# Patient Record
Sex: Female | Born: 1975 | Race: White | Hispanic: No | Marital: Single
Health system: Southern US, Community
[De-identification: ages and names within clinical notes are randomized; demographics above are authoritative.]

## PROBLEM LIST (undated history)

## (undated) DIAGNOSIS — I38 Endocarditis, valve unspecified: Secondary | ICD-10-CM

## (undated) DIAGNOSIS — J45909 Unspecified asthma, uncomplicated: Secondary | ICD-10-CM

## (undated) DIAGNOSIS — B1920 Unspecified viral hepatitis C without hepatic coma: Secondary | ICD-10-CM

## (undated) DIAGNOSIS — D649 Anemia, unspecified: Secondary | ICD-10-CM

## (undated) DIAGNOSIS — F1910 Other psychoactive substance abuse, uncomplicated: Secondary | ICD-10-CM

## (undated) DIAGNOSIS — M1990 Unspecified osteoarthritis, unspecified site: Secondary | ICD-10-CM

## (undated) HISTORY — PX: TUMOR EXCISION: SHX421

## (undated) HISTORY — PX: OTHER SURGICAL HISTORY: SHX169

---

## 1999-01-29 ENCOUNTER — Inpatient Hospital Stay (HOSPITAL_COMMUNITY): Admission: AD | Admit: 1999-01-29 | Discharge: 1999-02-03 | Payer: Self-pay | Admitting: Psychiatry

## 1999-01-29 ENCOUNTER — Emergency Department (HOSPITAL_COMMUNITY): Admission: EM | Admit: 1999-01-29 | Discharge: 1999-01-29 | Payer: Self-pay | Admitting: Emergency Medicine

## 1999-09-02 ENCOUNTER — Inpatient Hospital Stay (HOSPITAL_COMMUNITY): Admission: EM | Admit: 1999-09-02 | Discharge: 1999-09-04 | Payer: Self-pay | Admitting: Emergency Medicine

## 1999-09-03 ENCOUNTER — Encounter: Payer: Self-pay | Admitting: Internal Medicine

## 2004-08-05 ENCOUNTER — Emergency Department: Payer: Self-pay | Admitting: Emergency Medicine

## 2005-04-23 ENCOUNTER — Emergency Department: Payer: Self-pay | Admitting: Emergency Medicine

## 2005-04-27 ENCOUNTER — Emergency Department (HOSPITAL_COMMUNITY): Admission: EM | Admit: 2005-04-27 | Discharge: 2005-04-28 | Payer: Self-pay | Admitting: Emergency Medicine

## 2006-04-15 ENCOUNTER — Ambulatory Visit (HOSPITAL_COMMUNITY): Admission: RE | Admit: 2006-04-15 | Discharge: 2006-04-15 | Payer: Self-pay | Admitting: Obstetrics and Gynecology

## 2006-05-15 ENCOUNTER — Ambulatory Visit (HOSPITAL_COMMUNITY): Admission: RE | Admit: 2006-05-15 | Discharge: 2006-05-15 | Payer: Self-pay | Admitting: Orthopedic Surgery

## 2006-06-08 ENCOUNTER — Ambulatory Visit: Payer: Self-pay | Admitting: Internal Medicine

## 2006-08-15 ENCOUNTER — Inpatient Hospital Stay (HOSPITAL_COMMUNITY): Admission: AD | Admit: 2006-08-15 | Discharge: 2006-08-15 | Payer: Self-pay | Admitting: Obstetrics & Gynecology

## 2006-08-21 ENCOUNTER — Ambulatory Visit: Payer: Self-pay | Admitting: *Deleted

## 2006-08-21 ENCOUNTER — Inpatient Hospital Stay (HOSPITAL_COMMUNITY): Admission: AD | Admit: 2006-08-21 | Discharge: 2006-08-24 | Payer: Self-pay | Admitting: Obstetrics & Gynecology

## 2009-10-13 ENCOUNTER — Emergency Department: Payer: Self-pay | Admitting: Emergency Medicine

## 2010-03-31 ENCOUNTER — Emergency Department: Payer: Self-pay | Admitting: Emergency Medicine

## 2010-04-03 ENCOUNTER — Emergency Department: Payer: Self-pay | Admitting: Emergency Medicine

## 2010-05-25 ENCOUNTER — Encounter: Payer: Self-pay | Admitting: Orthopedic Surgery

## 2010-08-08 ENCOUNTER — Inpatient Hospital Stay: Payer: Self-pay | Admitting: Psychiatry

## 2010-09-19 NOTE — Assessment & Plan Note (Signed)
[facility]                             PULMONARY OFFICE NOTE   NAME:Andrea King, Andrea King                      MRN:          [id number]  DATE:06/08/2006                            DOB:          January 18, 1976    HISTORY:  A 35 year old white female active smoker who is now [redacted] weeks  pregnant and describes a pattern of chronic rhinitis lasting forever.  Typically present year round, but worse in the spring associated with  nasal obstructive symptoms and watery discharge, controlled with  Sudafed.  For the last 2 years, in addition to these symptoms, she has  had daily symptoms of cough, congestion, wheezing, and shortness of  breath that improve with beta 2 agonists.  She finds herself now [redacted]  weeks pregnant, using albuterol up to 4 times daily with occasional  night time use as well with no significant pleuritic chest pain, fevers,  chills, sweats, orthopnea, PND, or leg swelling, or obvious flare-up of  her nasal symptoms.   ALLERGIES:  None known.   MEDICATIONS:  Prenatal vitamin 1 daily.  Percocet.  Albuterol.  Phenergan with codeine p.r.n.   SOCIAL HISTORY:  She continues to smoke a half pack a day.  She denies  any unusual travel, pet, or hobby exposure.   FAMILY HISTORY:  She denies any respiratory diseases in the family  including atopy or asthma.   REVIEW OF SYSTEMS:  Taken in detail on the worksheet and negative,  except as outlined above.   PHYSICAL EXAMINATION:  This is a pleasant but slightly anxious  ambulatory white female with a nasal tone to her voice.  VITAL SIGNS:  Stable vital signs.  HEENT:  Reveals moderate turbinate edema.  She smells heavily of  cigarettes.  Oropharynx is clear.  No evidence of post-nasal drainage or  cobblestoning, however.  NECK:  Supple without cervical adenopathy or tenderness. Trachea is  midline.  No thyromegaly.  LUNGS:  Fields reveal minimal rhonchi on FVC maneuver 5-and-a-half hours  after her last  albuterol treatment.  CARDIAC:  Regular rate and rhythm without murmur, gallop, or rub.  ABDOMEN:  Soft and benign.  Consistent with intrauterine pregnancy.  EXTREMITIES:  Warm without calf tenderness, cyanosis, clubbing, or  edema.   Heme saturation 98% on room air.   MDI technique at baseline was 25%, but improved to 75% with  bronchodilators.   IMPRESSION:  Chronic asthmatic bronchitis being partially fueled by  cigarettes, perhaps also related to chronic rhinitis and now rhinitis of  pregnancy, plus gastroesophageal reflux disease related to pregnancy.  Obviously, the cigarettes are the greatest issue, and we discussed this  at length today.   Since she is over-using albuterol, I have recommended that she use  Symbicort 164.5 one puff b.i.d. and add Nasacort AQ b.i.d. (samples of  both given for the next 4 weeks).  I have reviewed a GERD diet with her  and recommended Pepcid AC over the counter for symptomatic heartburn.   I have also given her my pH number to call if she notices worsening that  does not respond promptly to  albuterol.     Charlaine Dalton. Sherene Sires, MD, [facility]  Electronically Signed    MBW/MedQ  DD: 06/08/2006  DT: 06/08/2006  Job #: [id number]   cc:   Shelbie Proctor. Shawnie Pons, M.D.

## 2012-05-12 ENCOUNTER — Emergency Department: Payer: Self-pay | Admitting: Emergency Medicine

## 2013-03-04 ENCOUNTER — Encounter (HOSPITAL_COMMUNITY): Payer: Self-pay | Admitting: Emergency Medicine

## 2013-03-04 ENCOUNTER — Emergency Department (HOSPITAL_COMMUNITY)
Admission: EM | Admit: 2013-03-04 | Discharge: 2013-03-04 | Disposition: A | Discharge status: Home / Self Care | Payer: Self-pay | Attending: Emergency Medicine | Admitting: Emergency Medicine

## 2013-03-04 ENCOUNTER — Emergency Department (HOSPITAL_COMMUNITY): Payer: Self-pay

## 2013-03-04 DIAGNOSIS — M1990 Unspecified osteoarthritis, unspecified site: Secondary | ICD-10-CM

## 2013-03-04 DIAGNOSIS — Z79899 Other long term (current) drug therapy: Secondary | ICD-10-CM | POA: Insufficient documentation

## 2013-03-04 DIAGNOSIS — F17200 Nicotine dependence, unspecified, uncomplicated: Secondary | ICD-10-CM | POA: Insufficient documentation

## 2013-03-04 DIAGNOSIS — M25562 Pain in left knee: Secondary | ICD-10-CM

## 2013-03-04 DIAGNOSIS — M25569 Pain in unspecified knee: Secondary | ICD-10-CM | POA: Insufficient documentation

## 2013-03-04 MED ORDER — PREDNISONE 10 MG PO TABS: ORAL_TABLET | ORAL | Status: DC

## 2013-03-04 MED ORDER — PREDNISONE 50 MG PO TABS
60.0000 mg | ORAL_TABLET | Freq: Once | ORAL | Status: AC
  Administered 2013-03-04: 60 mg via ORAL
  Filled 2013-03-04 (×2): qty 1

## 2013-03-04 MED ORDER — DICLOFENAC SODIUM 75 MG PO TBEC: 75.0000 mg | DELAYED_RELEASE_TABLET | Freq: Two times a day (BID) | ORAL | Status: DC

## 2013-03-04 MED ORDER — ONDANSETRON HCL 4 MG PO TABS
4.0000 mg | ORAL_TABLET | Freq: Once | ORAL | Status: AC
  Administered 2013-03-04: 4 mg via ORAL
  Filled 2013-03-04: qty 1

## 2013-03-04 MED ORDER — TRAMADOL HCL 50 MG PO TABS
50.0000 mg | ORAL_TABLET | Freq: Once | ORAL | Status: AC
  Administered 2013-03-04: 50 mg via ORAL
  Filled 2013-03-04: qty 1

## 2013-03-04 MED ORDER — TRAMADOL HCL 50 MG PO TABS: 50.0000 mg | ORAL_TABLET | Freq: Four times a day (QID) | ORAL | Status: DC | PRN

## 2013-03-04 MED ORDER — KETOROLAC TROMETHAMINE 10 MG PO TABS
10.0000 mg | ORAL_TABLET | Freq: Once | ORAL | Status: AC
  Administered 2013-03-04: 10 mg via ORAL
  Filled 2013-03-04: qty 1

## 2013-03-04 NOTE — ED Notes (Signed)
Pt reports left knee pain since waking this am, denies any known injury

## 2013-03-04 NOTE — ED Notes (Signed)
Hobson PA at bedside speaking with pt

## 2013-03-04 NOTE — ED Provider Notes (Signed)
CSN: [id number]     Arrival date & time 03/04/13  1609 History   First MD Initiated Contact with Patient 03/04/13 1641     Chief Complaint  Patient presents with  . Knee Pain   (Consider location/radiation/quality/duration/timing/severity/associated sxs/prior Treatment) HPI Comments: Patient is a 37 year old female who states that on yesterday she was in her usual good health. She states that she did more walking and climbing up steps than usual. This morning upon awakening she noted severe pain of her left knee. She thinks that there may have been a little swelling there as well. She denies any recent injury or trauma. There's been no previous operations or procedures involving the left knee.  The patient also states that she has noticed that when she stands up or has to strain that there is an area that" pops out" in her left groin. She states she was told that it was a lymph node, but would like for it to be checked. It is not causing any pain at this time.  Patient is a 37 y.o. female presenting with knee pain. The history is provided by the patient.  Knee Pain Associated symptoms: no back pain and no neck pain     History reviewed. No pertinent past medical history. Past Surgical History  Procedure Laterality Date  . Tumor excision      left scalpula   No family history on file. History  Substance Use Topics  . Smoking status: Current Every Day Smoker    Types: Cigarettes  . Smokeless tobacco: Not on file  . Alcohol Use: No   OB History   Grav Para Term Preterm Abortions TAB SAB Ect Mult Living                 Review of Systems  Constitutional: Negative for activity change.       All ROS Neg except as noted in HPI  HENT: Negative for nosebleeds.   Eyes: Negative for photophobia and discharge.  Respiratory: Negative for cough, shortness of breath and wheezing.   Cardiovascular: Negative for chest pain and palpitations.  Gastrointestinal: Negative for abdominal pain and  blood in stool.  Genitourinary: Negative for dysuria, frequency and hematuria.  Musculoskeletal: Positive for arthralgias. Negative for back pain and neck pain.  Skin: Negative.   Neurological: Negative for dizziness, seizures and speech difficulty.  Psychiatric/Behavioral: Negative for hallucinations and confusion.    Allergies  Review of patient's allergies indicates no known allergies.  Home Medications   Current Outpatient Rx  Name  Route  Sig  Dispense  Refill  . naproxen sodium (ALEVE) 220 MG tablet   Oral   Take 440 mg by mouth once as needed (for pain).         Marland Kitchen diclofenac (VOLTAREN) 75 MG EC tablet   Oral   Take 1 tablet (75 mg total) by mouth 2 (two) times daily.   14 tablet   0   . predniSONE (DELTASONE) 10 MG tablet      5,4,3,2,1 - take with food   15 tablet   0   . traMADol (ULTRAM) 50 MG tablet   Oral   Take 1 tablet (50 mg total) by mouth every 6 (six) hours as needed for pain.   20 tablet   0    BP 111/70  Pulse 94  Temp(Src) 98.7 F (37.1 C) (Oral)  Resp 20  Ht 5\' 2"  (1.575 m)  Wt 120 lb (54.432 kg)  BMI 21.94 kg/m2  SpO2 100%  LMP 02/26/2013 Physical Exam  Nursing note and vitals reviewed. Constitutional: She is oriented to person, place, and time. She appears well-developed and well-nourished.  Non-toxic appearance.  HENT:  Head: Normocephalic.  Right Ear: Tympanic membrane and external ear normal.  Left Ear: Tympanic membrane and external ear normal.  Eyes: EOM and lids are normal. Pupils are equal, round, and reactive to light.  Neck: Normal range of motion. Neck supple. Carotid bruit is not present.  Cardiovascular: Normal rate, regular rhythm, normal heart sounds, intact distal pulses and normal pulses.   Pulmonary/Chest: Breath sounds normal. No respiratory distress.  Abdominal: Soft. Bowel sounds are normal. There is no tenderness. There is no guarding.  Musculoskeletal: Normal range of motion.  There is good range of motion of  the left hip. There is crepitus present with attempted range of motion of the left knee. There is pain with attempted range of motion of the left knee. There is no posterior mass appreciated. The left knee is warmer than the right knee. There is no deformity of the quadricep area or the anterior tibial tuberosity. The Achilles tendon is intact. There is full range of motion of the toes. The dorsalis pedis pulses 2+.  Lymphadenopathy:       Head (right side): No submandibular adenopathy present.       Head (left side): No submandibular adenopathy present.    She has no cervical adenopathy.  Neurological: She is alert and oriented to person, place, and time. She has normal strength. No cranial nerve deficit or sensory deficit.  Skin: Skin is warm and dry.  Psychiatric: She has a normal mood and affect. Her speech is normal.    ED Course  Procedures (including critical care time) Labs Review Labs Reviewed - No data to display Imaging Review Dg Knee Complete 4 Views Left  03/04/2013   CLINICAL DATA:  Medial knee pain  EXAM: LEFT KNEE - COMPLETE 4+ VIEW  COMPARISON:  None.  FINDINGS: There is no evidence of fracture, dislocation, or joint effusion. There is no evidence of arthropathy or other focal bone abnormality. Soft tissues are unremarkable.  IMPRESSION: No acute abnormality noted.   Electronically Signed   By: Alcide Clever M.D.   On: 03/04/2013 16:45    EKG Interpretation   None       MDM   1. Knee pain, left   2. DJD (degenerative joint disease)    **I have reviewed nursing notes, vital signs, and all appropriate lab and imaging results for this patient.*  X-ray of the left knee is negative for fracture or dislocation. Examination is consistent with arthritis or inflammatory process.  Examination of the left groin is consistent with a small hernia area that is reducible with patient sitting.  The patient is advised to see Dr. Hilda Lias for evaluation of the knee, and Dr. Malvin Johns  for evaluation of the hernia. The patient is given information on resources, patient also given information concerning the adult medicine clinic as the patient does not have a primary physician and states she does not have insurance resources at this time.Rx for prednisone, diclofenac, and ultram given to the patient.  Kathie Dike, PA-C 03/04/13 1731

## 2013-03-04 NOTE — ED Notes (Signed)
Pt c/o left knee pain and swelling that she woke up this am, denies any injury, states that she went to bed fine, pt also concerned because of swelling to left groin area for the past 8 months,

## 2013-03-04 NOTE — ED Provider Notes (Signed)
Medical screening examination/treatment/procedure(s) were performed by non-physician practitioner and as supervising physician I was immediately available for consultation/collaboration.  EKG Interpretation   None       Devoria Albe, MD, [facility]   Ward Givens, MD 03/04/13 [phone]

## 2013-04-27 ENCOUNTER — Encounter (HOSPITAL_COMMUNITY): Payer: Self-pay | Admitting: Emergency Medicine

## 2013-04-27 ENCOUNTER — Inpatient Hospital Stay (HOSPITAL_COMMUNITY)
Admission: EM | Admit: 2013-04-27 | Discharge: 2013-04-29 | DRG: 352 | Disposition: A | Discharge status: Home / Self Care | Payer: Self-pay | Attending: General Surgery | Admitting: General Surgery

## 2013-04-27 DIAGNOSIS — K4130 Unilateral femoral hernia, with obstruction, without gangrene, not specified as recurrent: Secondary | ICD-10-CM | POA: Diagnosis present

## 2013-04-27 DIAGNOSIS — IMO0001 Reserved for inherently not codable concepts without codable children: Principal | ICD-10-CM | POA: Diagnosis present

## 2013-04-27 DIAGNOSIS — F17200 Nicotine dependence, unspecified, uncomplicated: Secondary | ICD-10-CM | POA: Diagnosis present

## 2013-04-27 DIAGNOSIS — K4030 Unilateral inguinal hernia, with obstruction, without gangrene, not specified as recurrent: Secondary | ICD-10-CM

## 2013-04-27 LAB — COMPREHENSIVE METABOLIC PANEL
ALT: 33 U/L (ref 0–35)
AST: 32 U/L (ref 0–37)
Albumin: 3.9 g/dL (ref 3.5–5.2)
Alkaline Phosphatase: 59 U/L (ref 39–117)
Calcium: 9 mg/dL (ref 8.4–10.5)
GFR calc Af Amer: 85 mL/min — ABNORMAL LOW (ref >90)
Potassium: 4 mEq/L (ref 3.5–5.1)
Sodium: 141 mEq/L (ref 135–145)
Total Protein: 8.2 g/dL (ref 6.0–8.3)

## 2013-04-27 LAB — CBC WITH DIFFERENTIAL/PLATELET
Basophils Absolute: 0.1 10*3/uL (ref 0.0–0.1)
Basophils Relative: 1 % (ref 0–1)
Eosinophils Absolute: 0.7 10*3/uL (ref 0.0–0.7)
Eosinophils Relative: 5 % (ref 0–5)
HCT: 43.6 % (ref 36.0–46.0)
Hemoglobin: 15.2 g/dL — ABNORMAL HIGH (ref 12.0–15.0)
Lymphocytes Relative: 38 % (ref 12–46)
Lymphs Abs: 5.4 10*3/uL — ABNORMAL HIGH (ref 0.7–4.0)
MCHC: 34.9 g/dL (ref 30.0–36.0)
MCV: 85 fL (ref 78.0–100.0)
Monocytes Relative: 5 % (ref 3–12)
Neutro Abs: 7.2 10*3/uL (ref 1.7–7.7)
RBC: 5.13 MIL/uL — ABNORMAL HIGH (ref 3.87–5.11)
RDW: 13.9 % (ref 11.5–15.5)
WBC: 14.1 10*3/uL — ABNORMAL HIGH (ref 4.0–10.5)

## 2013-04-27 LAB — POCT PREGNANCY, URINE: Preg Test, Ur: NEGATIVE

## 2013-04-27 LAB — CG4 I-STAT (LACTIC ACID): Lactic Acid, Venous: 1.83 mmol/L (ref 0.5–2.2)

## 2013-04-27 MED ORDER — HYDROMORPHONE HCL PF 1 MG/ML IJ SOLN
1.0000 mg | Freq: Once | INTRAMUSCULAR | Status: AC
  Administered 2013-04-27: 1 mg via INTRAVENOUS
  Filled 2013-04-27: qty 1

## 2013-04-27 MED ORDER — IOHEXOL 300 MG/ML  SOLN
25.0000 mL | Freq: Once | INTRAMUSCULAR | Status: AC | PRN
  Administered 2013-04-27: 25 mL via ORAL

## 2013-04-27 MED ORDER — ONDANSETRON HCL 4 MG/2ML IJ SOLN
4.0000 mg | Freq: Once | INTRAMUSCULAR | Status: AC
  Administered 2013-04-27: 4 mg via INTRAVENOUS
  Filled 2013-04-27: qty 2

## 2013-04-27 NOTE — ED Provider Notes (Signed)
CSN: [id number]     Arrival date & time 04/27/13  2144 History   First MD Initiated Contact with Patient 04/27/13 2247     Chief Complaint  Patient presents with  . Abdominal Pain   (Consider location/radiation/quality/duration/timing/severity/associated sxs/prior Treatment) HPI Andrea King is a 37 y.o. female who presents to emergency with complaint of abdominal pain. Patient states that she has had a soft mass in the left groin for the last several years. She states that in the last several days the mass got is a low is and this was in the  bigger. She states that yesterday at the mass became hard and more painful. She states pain has been severe since then. Patient states that she has not had a bowel movement since this pain started. She states that she has had normal bowel movements prior to that. Patient denies any fever. She denies any nausea, vomiting. She denies any urinary symptoms. She denies ever seeing a doctor for this. She tried taking ibuprofen, Tylenol, tramadol with no relief.  History reviewed. No pertinent past medical history. Past Surgical History  Procedure Laterality Date  . Tumor excision      left scalpula   No family history on file. History  Substance Use Topics  . Smoking status: Current Every Day Smoker    Types: Cigarettes  . Smokeless tobacco: Not on file  . Alcohol Use: No   OB History   Grav Para Term Preterm Abortions TAB SAB Ect Mult Living                 Review of Systems  Constitutional: Negative for fever and chills.  Respiratory: Negative for cough, chest tightness and shortness of breath.   Cardiovascular: Negative for chest pain, palpitations and leg swelling.  Gastrointestinal: Positive for abdominal pain. Negative for nausea, vomiting, diarrhea, constipation and blood in stool.  Genitourinary: Negative for dysuria, flank pain, vaginal bleeding, vaginal discharge, vaginal pain and pelvic pain.  Musculoskeletal: Negative for  arthralgias, myalgias, neck pain and neck stiffness.  Skin: Negative for rash.  Neurological: Negative for dizziness, weakness and headaches.  All other systems reviewed and are negative.    Allergies  Review of patient's allergies indicates no known allergies.  Home Medications   Current Outpatient Rx  Name  Route  Sig  Dispense  Refill  . acetaminophen (TYLENOL) 500 MG tablet   Oral   Take 1,000 mg by mouth every 6 (six) hours as needed for moderate pain.         Marland Kitchen ibuprofen (ADVIL,MOTRIN) 200 MG tablet   Oral   Take 600 mg by mouth every 6 (six) hours as needed for moderate pain.         . traMADol (ULTRAM) 50 MG tablet   Oral   Take 1 tablet (50 mg total) by mouth every 6 (six) hours as needed for pain.   20 tablet   0    BP 100/55  Pulse 86  Temp(Src) 97.7 F (36.5 C) (Oral)  Resp 18  Wt 121 lb 9 oz (55.14 kg)  SpO2 94%  LMP 04/03/2013 Physical Exam  Nursing note and vitals reviewed. Constitutional: She appears well-developed and well-nourished.  Appears to be in pain  HENT:  Head: Normocephalic.  Eyes: Conjunctivae are normal.  Neck: Neck supple.  Cardiovascular: Normal rate, regular rhythm and normal heart sounds.   Pulmonary/Chest: Effort normal and breath sounds normal. No respiratory distress. She has no wheezes. She has no rales.  Abdominal: Soft. Bowel sounds are normal. She exhibits no distension. There is no tenderness. There is no rebound.   approximatly 4x6cm swelling to the left groin. Swelling is tender, soft. No surrounding skin discoloration.   Musculoskeletal: She exhibits no edema.  Neurological: She is alert.  Skin: Skin is warm and dry.  Psychiatric: She has a normal mood and affect. Her behavior is normal.    ED Course  Procedures (including critical care time) Labs Review Labs Reviewed  COMPREHENSIVE METABOLIC PANEL - Abnormal; Notable for the following:    Total Bilirubin 0.1 (*)    GFR calc non Af Amer 74 (*)    GFR calc Af  Amer 85 (*)    All other components within normal limits  CBC WITH DIFFERENTIAL - Abnormal; Notable for the following:    WBC 14.1 (*)    RBC 5.13 (*)    Hemoglobin 15.2 (*)    Lymphs Abs 5.4 (*)    All other components within normal limits  URINALYSIS, ROUTINE W REFLEX MICROSCOPIC  POCT PREGNANCY, URINE  CG4 I-STAT (LACTIC ACID)   Imaging Review Ct Abdomen Pelvis W Contrast  04/28/2013   CLINICAL DATA:  Lower abdominal pain and mass in groin that has been there for "months."  EXAM: CT ABDOMEN AND PELVIS WITH CONTRAST  TECHNIQUE: Multidetector CT imaging of the abdomen and pelvis was performed using the standard protocol following bolus administration of intravenous contrast.  CONTRAST:  OMNIPAQUE IOHEXOL 300 MG/ML  SOLN  COMPARISON:  None available for comparison at time of study interpretation.  FINDINGS: Included view of the lung bases demonstrate minimal dependent atelectasis. Visualized heart and pericardium are unremarkable.  The liver, spleen, pancreas and adrenal glands are unremarkable. The gallbladder is contracted and otherwise unremarkable.  The stomach, small and large bowel are normal in course and caliber without inflammatory changes. No intraperitoneal free fluid nor free air.  Kidneys are orthotopic, demonstrating symmetric enhancement without nephrolithiasis, hydronephrosis or renal masses. The unopacified ureters are normal in course and caliber. Delayed imaging through the kidneys demonstrates symmetric prompt excretion to the proximal urinary collecting system. Urinary bladder is partially distended and unremarkable.  Great vessels are normal in course and caliber. No lymphadenopathy by CT size criteria. Internal reproductive organs are normal in appearance, retroverted uterus. The soft tissues and included osseous structures are nonsuspicious.  3.6 x 4.3 cm left inguinal hernia containing fat demonstrates mild inflammatory changes without fluid collection. Minimal  surrounding soft tissue reticulation with apparent varix. Tiny fat containing umbilical hernia. Grade 1 L5-S1 anterolisthesis associated with bilateral chronic L5 pars interarticularis defects.  IMPRESSION: 3.6 x 4.3 cm fat containing left inguinal hernia with mild inflammation, unclear whether this reflects superimposed possible cellulitis, no discrete mass or abscess.   Electronically Signed   By: Awilda Metro   On: 04/28/2013 00:35    EKG Interpretation   None       MDM   1. Incarcerated inguinal hernia     Pt with left inguinal mass, it is not reducible, it is tender. CT showing fat containing inguinal hernial with mild inflammation. Suspect possible incarceration given amount of tenderness. Pain is rated as 10/10 even after 1mg  of dilaudid. Will try another dose  1:09 AM Spoke with general surgery, Dr. Donell Beers, will admit pt. Temp orders placed.   Filed Vitals:   04/27/13 2150 04/28/13 0105  BP: 100/55 102/56  Pulse: 86 81  Temp: 97.7 F (36.5 C)   TempSrc: Oral   Resp:  18   Weight: 121 lb 9 oz (55.14 kg)   SpO2: 94% 94%       Lottie Mussel, PA-C 04/28/13 0109

## 2013-04-27 NOTE — ED Notes (Signed)
Pt states that she has had this mass on her groin for "months". Pt states that she does not know how she got it, and that it got worse last night. Kirichenko, PA at bedside.

## 2013-04-27 NOTE — ED Notes (Signed)
Lower abd. Pain. There is mass there that has got worse. Much harder. 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

## 2013-04-28 ENCOUNTER — Inpatient Hospital Stay (HOSPITAL_COMMUNITY): Payer: Self-pay | Admitting: Certified Registered"

## 2013-04-28 ENCOUNTER — Encounter (HOSPITAL_COMMUNITY): Payer: Self-pay | Admitting: Radiology

## 2013-04-28 ENCOUNTER — Emergency Department (HOSPITAL_COMMUNITY): Payer: Self-pay

## 2013-04-28 ENCOUNTER — Encounter (HOSPITAL_COMMUNITY): Payer: Self-pay | Admitting: Certified Registered"

## 2013-04-28 ENCOUNTER — Encounter (HOSPITAL_COMMUNITY): Admission: EM | Disposition: A | Discharge status: Home / Self Care | Payer: Self-pay | Source: Home / Self Care

## 2013-04-28 DIAGNOSIS — K4030 Unilateral inguinal hernia, with obstruction, without gangrene, not specified as recurrent: Secondary | ICD-10-CM

## 2013-04-28 DIAGNOSIS — K4130 Unilateral femoral hernia, with obstruction, without gangrene, not specified as recurrent: Secondary | ICD-10-CM | POA: Diagnosis present

## 2013-04-28 HISTORY — PX: INGUINAL HERNIA REPAIR: SHX194

## 2013-04-28 LAB — URINALYSIS, ROUTINE W REFLEX MICROSCOPIC
Bilirubin Urine: NEGATIVE
Glucose, UA: NEGATIVE mg/dL
Hgb urine dipstick: NEGATIVE
Leukocytes, UA: NEGATIVE
Protein, ur: NEGATIVE mg/dL
Specific Gravity, Urine: 1.009 (ref 1.005–1.030)
pH: 5 (ref 5.0–8.0)

## 2013-04-28 LAB — SURGICAL PCR SCREEN: MRSA, PCR: NEGATIVE

## 2013-04-28 SURGERY — REPAIR, HERNIA, INGUINAL, ADULT
Anesthesia: General | Laterality: Left

## 2013-04-28 MED ORDER — OXYCODONE HCL 5 MG/5ML PO SOLN
5.0000 mg | Freq: Once | ORAL | Status: AC | PRN
Start: 2013-04-28 — End: 2013-04-28

## 2013-04-28 MED ORDER — 0.9 % SODIUM CHLORIDE (POUR BTL) OPTIME
TOPICAL | Status: DC | PRN
  Administered 2013-04-28: 1000 mL

## 2013-04-28 MED ORDER — MIDAZOLAM HCL 5 MG/5ML IJ SOLN
INTRAMUSCULAR | Status: DC | PRN
  Administered 2013-04-28: 2 mg via INTRAVENOUS

## 2013-04-28 MED ORDER — KCL IN DEXTROSE-NACL 20-5-0.45 MEQ/L-%-% IV SOLN
INTRAVENOUS | Status: DC
  Administered 2013-04-28: 09:00:00 via INTRAVENOUS
  Filled 2013-04-28 (×3): qty 1000

## 2013-04-28 MED ORDER — SODIUM CHLORIDE 0.9 % IR SOLN
Status: DC | PRN
  Administered 2013-04-28: 13:00:00

## 2013-04-28 MED ORDER — HYDROMORPHONE HCL PF 1 MG/ML IJ SOLN
2.0000 mg | INTRAMUSCULAR | Status: DC | PRN
  Administered 2013-04-28: 2 mg via INTRAVENOUS

## 2013-04-28 MED ORDER — PANTOPRAZOLE SODIUM 40 MG IV SOLR: 40.0000 mg | Freq: Every day | INTRAVENOUS | Status: DC

## 2013-04-28 MED ORDER — ONDANSETRON HCL 4 MG/2ML IJ SOLN: 4.0000 mg | Freq: Four times a day (QID) | INTRAMUSCULAR | Status: DC | PRN

## 2013-04-28 MED ORDER — DIPHENHYDRAMINE HCL 12.5 MG/5ML PO ELIX: 12.5000 mg | ORAL_SOLUTION | Freq: Four times a day (QID) | ORAL | Status: DC | PRN

## 2013-04-28 MED ORDER — BUPIVACAINE HCL (PF) 0.25 % IJ SOLN
INTRAMUSCULAR | Status: DC | PRN
  Administered 2013-04-28: 17 mL

## 2013-04-28 MED ORDER — LIDOCAINE HCL (CARDIAC) 20 MG/ML IV SOLN
INTRAVENOUS | Status: DC | PRN
  Administered 2013-04-28: 100 mg via INTRAVENOUS

## 2013-04-28 MED ORDER — HYDROMORPHONE HCL PF 1 MG/ML IJ SOLN
INTRAMUSCULAR | Status: AC
  Filled 2013-04-28: qty 2

## 2013-04-28 MED ORDER — LIDOCAINE HCL (PF) 1 % IJ SOLN
INTRAMUSCULAR | Status: AC
  Filled 2013-04-28: qty 30

## 2013-04-28 MED ORDER — HYDROMORPHONE HCL PF 1 MG/ML IJ SOLN
0.2500 mg | INTRAMUSCULAR | Status: DC | PRN
  Administered 2013-04-28 (×4): 0.5 mg via INTRAVENOUS

## 2013-04-28 MED ORDER — HYDROCODONE-ACETAMINOPHEN 5-325 MG PO TABS
1.0000 | ORAL_TABLET | ORAL | Status: DC | PRN
  Administered 2013-04-28 – 2013-04-29 (×5): 2 via ORAL
  Filled 2013-04-28 (×5): qty 2

## 2013-04-28 MED ORDER — SODIUM CHLORIDE 0.9 % IV SOLN
INTRAVENOUS | Status: AC
  Administered 2013-04-28: 100 mL/h via INTRAVENOUS

## 2013-04-28 MED ORDER — FENTANYL CITRATE 0.05 MG/ML IJ SOLN
INTRAMUSCULAR | Status: DC | PRN
  Administered 2013-04-28: 50 ug via INTRAVENOUS
  Administered 2013-04-28: 25 ug via INTRAVENOUS
  Administered 2013-04-28 (×3): 50 ug via INTRAVENOUS
  Administered 2013-04-28: 25 ug via INTRAVENOUS
  Administered 2013-04-28: 50 ug via INTRAVENOUS
  Administered 2013-04-28: 100 ug via INTRAVENOUS

## 2013-04-28 MED ORDER — BUPIVACAINE HCL (PF) 0.25 % IJ SOLN
INTRAMUSCULAR | Status: AC
  Filled 2013-04-28: qty 30

## 2013-04-28 MED ORDER — HYDROMORPHONE HCL PF 1 MG/ML IJ SOLN
1.0000 mg | Freq: Once | INTRAMUSCULAR | Status: AC
  Administered 2013-04-28: 1 mg via INTRAVENOUS
  Filled 2013-04-28: qty 1

## 2013-04-28 MED ORDER — MORPHINE SULFATE 4 MG/ML IJ SOLN
6.0000 mg | INTRAMUSCULAR | Status: DC | PRN
  Administered 2013-04-28 (×2): 6 mg via INTRAVENOUS
  Filled 2013-04-28 (×2): qty 2

## 2013-04-28 MED ORDER — OXYCODONE HCL 5 MG PO TABS
5.0000 mg | ORAL_TABLET | Freq: Once | ORAL | Status: AC | PRN
  Administered 2013-04-28: 5 mg via ORAL

## 2013-04-28 MED ORDER — IOHEXOL 300 MG/ML  SOLN
100.0000 mL | Freq: Once | INTRAMUSCULAR | Status: AC | PRN
  Administered 2013-04-28: 100 mL via INTRAVENOUS

## 2013-04-28 MED ORDER — DIPHENHYDRAMINE HCL 50 MG/ML IJ SOLN: 12.5000 mg | Freq: Four times a day (QID) | INTRAMUSCULAR | Status: DC | PRN

## 2013-04-28 MED ORDER — ONDANSETRON HCL 4 MG/2ML IJ SOLN: 4.0000 mg | Freq: Once | INTRAMUSCULAR | Status: DC | PRN

## 2013-04-28 MED ORDER — CEFAZOLIN SODIUM 1-5 GM-% IV SOLN
1.0000 g | Freq: Three times a day (TID) | INTRAVENOUS | Status: DC
  Administered 2013-04-28 (×2): 1 g via INTRAVENOUS
  Filled 2013-04-28 (×4): qty 50

## 2013-04-28 MED ORDER — HYDROMORPHONE HCL PF 1 MG/ML IJ SOLN
INTRAMUSCULAR | Status: AC
  Filled 2013-04-28: qty 1

## 2013-04-28 MED ORDER — NICOTINE 14 MG/24HR TD PT24
14.0000 mg | MEDICATED_PATCH | TRANSDERMAL | Status: DC
  Administered 2013-04-28 – 2013-04-29 (×2): 14 mg via TRANSDERMAL
  Filled 2013-04-28 (×2): qty 1

## 2013-04-28 MED ORDER — TRAMADOL HCL 50 MG PO TABS
50.0000 mg | ORAL_TABLET | Freq: Four times a day (QID) | ORAL | Status: DC | PRN
  Administered 2013-04-28: 50 mg via ORAL
  Filled 2013-04-28: qty 1

## 2013-04-28 MED ORDER — KCL IN DEXTROSE-NACL 20-5-0.45 MEQ/L-%-% IV SOLN
INTRAVENOUS | Status: DC
  Administered 2013-04-28: 15:00:00 via INTRAVENOUS
  Filled 2013-04-28 (×2): qty 1000

## 2013-04-28 MED ORDER — PROPOFOL 10 MG/ML IV BOLUS
INTRAVENOUS | Status: DC | PRN
  Administered 2013-04-28: 160 mg via INTRAVENOUS

## 2013-04-28 MED ORDER — CEFAZOLIN SODIUM 1-5 GM-% IV SOLN: 1.0000 g | Freq: Once | INTRAVENOUS | Status: DC

## 2013-04-28 MED ORDER — HYDROMORPHONE HCL PF 1 MG/ML IJ SOLN
0.5000 mg | INTRAMUSCULAR | Status: DC | PRN
  Administered 2013-04-28 – 2013-04-29 (×4): 1 mg via INTRAVENOUS
  Filled 2013-04-28 (×4): qty 1

## 2013-04-28 MED ORDER — ACETAMINOPHEN 500 MG PO TABS: 1000.0000 mg | ORAL_TABLET | Freq: Four times a day (QID) | ORAL | Status: DC | PRN

## 2013-04-28 MED ORDER — CEFAZOLIN SODIUM 1-5 GM-% IV SOLN: 1.0000 g | Freq: Three times a day (TID) | INTRAVENOUS | Status: DC

## 2013-04-28 MED ORDER — OXYCODONE HCL 5 MG PO TABS
ORAL_TABLET | ORAL | Status: AC
  Filled 2013-04-28: qty 1

## 2013-04-28 MED ORDER — ONDANSETRON HCL 4 MG/2ML IJ SOLN
INTRAMUSCULAR | Status: DC | PRN
  Administered 2013-04-28: 4 mg via INTRAVENOUS

## 2013-04-28 MED ORDER — DOCUSATE SODIUM 100 MG PO CAPS
100.0000 mg | ORAL_CAPSULE | Freq: Two times a day (BID) | ORAL | Status: DC
  Administered 2013-04-28 – 2013-04-29 (×2): 100 mg via ORAL
  Filled 2013-04-28 (×2): qty 1

## 2013-04-28 MED ORDER — MEPERIDINE HCL 25 MG/ML IJ SOLN: 6.2500 mg | INTRAMUSCULAR | Status: DC | PRN

## 2013-04-28 MED ORDER — LACTATED RINGERS IV SOLN
INTRAVENOUS | Status: DC
  Administered 2013-04-28: 12:00:00 via INTRAVENOUS

## 2013-04-28 MED ORDER — LIDOCAINE-EPINEPHRINE (PF) 1 %-1:200000 IJ SOLN
INTRAMUSCULAR | Status: AC
  Filled 2013-04-28: qty 10

## 2013-04-28 SURGICAL SUPPLY — 45 items
BAG DECANTER FOR FLEXI CONT (MISCELLANEOUS) ×2 IMPLANT
BLADE SURG 10 STRL SS (BLADE) ×2 IMPLANT
BLADE SURG 15 STRL LF DISP TIS (BLADE) ×1 IMPLANT
BLADE SURG 15 STRL SS (BLADE) ×1
CANISTER SUCTION 2500CC (MISCELLANEOUS) ×2 IMPLANT
CHLORAPREP W/TINT 26ML (MISCELLANEOUS) ×2 IMPLANT
CLEANER TIP ELECTROSURG 2X2 (MISCELLANEOUS) ×2 IMPLANT
COVER SURGICAL LIGHT HANDLE (MISCELLANEOUS) ×2 IMPLANT
DERMABOND ADVANCED (GAUZE/BANDAGES/DRESSINGS) ×1
DERMABOND ADVANCED .7 DNX12 (GAUZE/BANDAGES/DRESSINGS) ×1 IMPLANT
DRAIN PENROSE 1/2X12 LTX STRL (WOUND CARE) ×2 IMPLANT
DRAPE LAPAROTOMY TRNSV 102X78 (DRAPE) ×2 IMPLANT
DRAPE UTILITY 15X26 W/TAPE STR (DRAPE) ×4 IMPLANT
DRSG TEGADERM 4X4.75 (GAUZE/BANDAGES/DRESSINGS) ×2 IMPLANT
ELECT REM PT RETURN 9FT ADLT (ELECTROSURGICAL) ×2
ELECTRODE REM PT RTRN 9FT ADLT (ELECTROSURGICAL) ×1 IMPLANT
GAUZE SPONGE 4X4 16PLY XRAY LF (GAUZE/BANDAGES/DRESSINGS) ×2 IMPLANT
GLOVE BIOGEL PI IND STRL 8 (GLOVE) ×1 IMPLANT
GLOVE BIOGEL PI INDICATOR 8 (GLOVE) ×1
GLOVE ECLIPSE 7.5 STRL STRAW (GLOVE) ×2 IMPLANT
GOWN STRL NON-REIN LRG LVL3 (GOWN DISPOSABLE) ×4 IMPLANT
KIT BASIN OR (CUSTOM PROCEDURE TRAY) ×2 IMPLANT
KIT ROOM TURNOVER OR (KITS) ×2 IMPLANT
MESH PROLENE 3X6 (Mesh General) ×2 IMPLANT
NEEDLE HYPO 25GX1X1/2 BEV (NEEDLE) ×2 IMPLANT
NS IRRIG 1000ML POUR BTL (IV SOLUTION) ×2 IMPLANT
PACK SURGICAL SETUP 50X90 (CUSTOM PROCEDURE TRAY) ×2 IMPLANT
PAD ARMBOARD 7.5X6 YLW CONV (MISCELLANEOUS) ×4 IMPLANT
PENCIL BUTTON HOLSTER BLD 10FT (ELECTRODE) ×2 IMPLANT
SPONGE INTESTINAL PEANUT (DISPOSABLE) ×2 IMPLANT
SPONGE LAP 18X18 X RAY DECT (DISPOSABLE) ×2 IMPLANT
STRIP CLOSURE SKIN 1/2X4 (GAUZE/BANDAGES/DRESSINGS) ×2 IMPLANT
SUT ETHIBOND 0 MO6 C/R (SUTURE) ×2 IMPLANT
SUT MON AB 4-0 PC3 18 (SUTURE) ×2 IMPLANT
SUT PROLENE 0 CT 2 (SUTURE) ×6 IMPLANT
SUT PROLENE 2 0 CT2 30 (SUTURE) ×2 IMPLANT
SUT VIC AB 3-0 SH 27 (SUTURE) ×4
SUT VIC AB 3-0 SH 27X BRD (SUTURE) ×4 IMPLANT
SUT VICRYL AB 3 0 TIES (SUTURE) ×2 IMPLANT
SYR BULB 3OZ (MISCELLANEOUS) ×2 IMPLANT
SYR CONTROL 10ML LL (SYRINGE) ×2 IMPLANT
TOWEL OR 17X24 6PK STRL BLUE (TOWEL DISPOSABLE) ×2 IMPLANT
TOWEL OR 17X26 10 PK STRL BLUE (TOWEL DISPOSABLE) ×2 IMPLANT
TUBE CONNECTING 12X1/4 (SUCTIONS) IMPLANT
YANKAUER SUCT BULB TIP NO VENT (SUCTIONS) IMPLANT

## 2013-04-28 NOTE — Preoperative (Signed)
Beta Blockers   Reason not to administer Beta Blockers:Not Applicable 

## 2013-04-28 NOTE — ED Provider Notes (Signed)
Patient examined by me. She has an incarcerated, fat containing inguinal hernia with significant ttp, pain and leukocytosis. Unsuccessful attempt at manual reduction. We will consult [facility].   Medical screening examination/treatment/procedure(s) were conducted as a shared visit with non-physician practitioner(s) and myself.  I personally evaluated the patient during the encounter.     Brandt Loosen, MD 04/28/13 [phone]

## 2013-04-28 NOTE — Transfer of Care (Signed)
Immediate Anesthesia Transfer of Care Note  Patient: Andrea King  Procedure(s) Performed: Procedure(s): HERNIA REPAIR INGUINAL ADULT with mesh (Left)  Patient Location: PACU  Anesthesia Type:General  Level of Consciousness: awake, alert , oriented and patient cooperative  Airway & Oxygen Therapy: Patient Spontanous Breathing and Patient connected to nasal cannula oxygen  Post-op Assessment: Report given to PACU RN, Post -op Vital signs reviewed and stable and Patient moving all extremities  Post vital signs: Reviewed and stable  Complications: No apparent anesthesia complications

## 2013-04-28 NOTE — Progress Notes (Addendum)
Patient with earrings in. Had patient remove ear rings. Placed in labeled cup at bedside. NS called [facility] requested that they come pick same up. Ear rings given to [place], NT from [facility]

## 2013-04-28 NOTE — Progress Notes (Signed)
Nutrition Brief Note  Patient identified on the Malnutrition Screening Tool (MST) Report  Wt Readings from Last 15 Encounters:  04/28/13 100 lb 1.4 oz (45.4 kg)  04/28/13 100 lb 1.4 oz (45.4 kg)  03/04/13 120 lb (54.432 kg)   Pt screened for MST-Score 2. Possible 20 lb weight loss over one month per previous medical record weights. Pt in OR for [facility] with mesh per MD note during time of assessment. Unable to attain additional food/nutrition hx. Will monitor diet advancement and PO intake for nutrition interventions as warranted.   Body mass index is 18.3 kg/(m^2). Patient meets criteria for Underweight based on current BMI.   Current diet order is NPO. Labs and medications reviewed.   No nutrition interventions warranted at this time. If nutrition issues arise, please consult RD.   Lloyd Huger MS RD LDN Clinical Dietitian Pager:[phone]

## 2013-04-28 NOTE — Op Note (Addendum)
OPERATIVE REPORT  DATE OF OPERATION:04/28/2013  PATIENT:  Andrea King  37 y.o. female  PRE-OPERATIVE DIAGNOSIS:  Left Inguinal Hernia  POST-OPERATIVE DIAGNOSIS:  Incarcerated left femoral hernia  PROCEDURE:  Procedure(s): HERNIA REPAIR INGUINAL ADULT with mesh  SURGEON:  Surgeon(s): Cherylynn Ridges, MD  ASSISTANT: None  ANESTHESIA:   general  EBL: <30 ml  BLOOD ADMINISTERED: none  DRAINS: none   SPECIMEN:  No Specimen  COUNTS CORRECT:  YES  PROCEDURE DETAILS: The patient was taken to the operating room and placed on the table in the supine position. After an adequate endotracheal anesthetic was administered she was prepped and draped in the usual sterile manner exposing her left inguinal area.  After proper time out was performed identifying the patient and the procedure be performed a transverse for a half to 5 cm incision was made at the level of the superficial ring on the left side. It was taken down to and through Scarpa's fascia to the external oblique fascia. We split the fascia along its fibers down through the superficial ring. There was a slight bulge but we were able to isolate the round ligament with a Penrose drain. We opened the fascia in the dissected down to the base of the round ligament where there was some peritoneum seen but no actual indirect sac.  We examined the floor where there appeared to be a direct component which we subsequently a closed using an oval piece of polypropylene mesh attaching it to the pubic tubercle and then the reflected portion of the lumen ligament laterally and the conjoined tendon anterior medially.  As we were examining the edge of the fascia to close the external oblique fascia below the inguinal ligament was sealed bulge representing an incarcerated femoral hernia. We were subsequently able to reduce hernia back above the ligament and then ligated at its base using an 0 Ethibond suture. Nor to do this the previously attached  mesh had to be detached from the reflected portion of the inguinal ligament. On the repeat closure we used 0 Prolene to attach it to Cooper's ligament and perform a modified Cooper's/McVay repair.  A transition stitch was made onto the reflected portion in the inguinal ligament at the femoral vein. Once the mesh was in place we irrigated with antibiotic solution. We allowed the round ligament fall back into the canal. We reapproximated the external oblique fascia using a running 3-0 Vicryl suture. Scarpa's fascia was reapproximated using interrupted 3-0 Vicryl sutures. The skin was closed after injecting the area with 20 cc of quarter percent Marcaine. He was reapproximated using running 4-0 Monocryl. Dermabond Steri-Strips and Tegaderms were used to complete the dressings.  PATIENT DISPOSITION:  PACU - hemodynamically stable.   Cherylynn Ridges 12/26/20142:00 PM

## 2013-04-28 NOTE — ED Provider Notes (Signed)
Medical screening examination/treatment/procedure(s) were conducted as a shared visit with non-physician practitioner(s) and myself.  I personally evaluated the patient during the encounter.     Brandt Loosen, MD 04/28/13 [phone]

## 2013-04-28 NOTE — Anesthesia Preprocedure Evaluation (Addendum)
Anesthesia Evaluation  Patient identified by MRN, date of birth, ID band Patient awake    Reviewed: Allergy & Precautions, H&P , NPO status , Patient's Chart, lab work & pertinent test results, reviewed documented beta blocker date and time   Airway Mallampati: I TM Distance: >3 FB Neck ROM: Full    Dental  (+) Poor Dentition, Missing, Chipped and Dental Advisory Given   Pulmonary Current Smoker,          Cardiovascular     Neuro/Psych    GI/Hepatic   Endo/Other    Renal/GU      Musculoskeletal   Abdominal   Peds  Hematology   Anesthesia Other Findings   Reproductive/Obstetrics                        Anesthesia Physical Anesthesia Plan  ASA: II  Anesthesia Plan: General   Post-op Pain Management:    Induction: Intravenous  Airway Management Planned: LMA  Additional Equipment:   Intra-op Plan:   Post-operative Plan: Extubation in OR  Informed Consent: I have reviewed the patients History and Physical, chart, labs and discussed the procedure including the risks, benefits and alternatives for the proposed anesthesia with the patient or authorized representative who has indicated his/her understanding and acceptance.     Plan Discussed with: Surgeon and CRNA  Anesthesia Plan Comments:       Anesthesia Quick Evaluation

## 2013-04-28 NOTE — H&P (Signed)
Andrea King is an 37 y.o. female.   Chief Complaint: left inguinal hernia HPI:  The patient is a 37 year old female who has noticed a bulge in her left groin for approximately 2-3 months. She states this has always been soft and a little sore.  She has not ever been able to make it go completely away during this time. Yesterday, she developed acute worsening of the pain and noted a hard knot in the left groin. She noted that this was about twice the size of the hernia previously.  She tried to be very still but the pain did not get better. Given the emergency Department. CT was performed demonstrating  some incarcerated omentum in the hernia.  She denies fevers and chills.  History reviewed. No pertinent past medical history.  Past Surgical History  Procedure Laterality Date  . Tumor excision      left scalpula    No family history on file. Social History:  reports that she has been smoking Cigarettes.  She has been smoking about 0.00 packs per day. She does not have any smokeless tobacco history on file. She reports that she does not drink alcohol or use illicit drugs.  Allergies: No Known Allergies  Medications Prior to Admission  Medication Sig Dispense Refill  . acetaminophen (TYLENOL) 500 MG tablet Take 1,000 mg by mouth every 6 (six) hours as needed for moderate pain.      Marland Kitchen ibuprofen (ADVIL,MOTRIN) 200 MG tablet Take 600 mg by mouth every 6 (six) hours as needed for moderate pain.      . traMADol (ULTRAM) 50 MG tablet Take 1 tablet (50 mg total) by mouth every 6 (six) hours as needed for pain.  20 tablet  0    Results for orders placed during the hospital encounter of 04/27/13 (from the past 48 hour(s))  COMPREHENSIVE METABOLIC PANEL     Status: Abnormal   Collection Time    04/27/13 10:20 PM      Result Value Range   Sodium 141  135 - 145 mEq/L   Potassium 4.0  3.5 - 5.1 mEq/L   Chloride 103  96 - 112 mEq/L   CO2 23  19 - 32 mEq/L   Glucose, Bld 91  70 - 99 mg/dL   BUN  10  6 - 23 mg/dL   Creatinine, Ser 1.61  0.50 - 1.10 mg/dL   Calcium 9.0  8.4 - 09.6 mg/dL   Total Protein 8.2  6.0 - 8.3 g/dL   Albumin 3.9  3.5 - 5.2 g/dL   AST 32  0 - 37 U/L   ALT 33  0 - 35 U/L   Alkaline Phosphatase 59  39 - 117 U/L   Total Bilirubin 0.1 (*) 0.3 - 1.2 mg/dL   GFR calc non Af Amer 74 (*) >90 mL/min   GFR calc Af Amer 85 (*) >90 mL/min   Comment: (NOTE)     The eGFR has been calculated using the CKD EPI equation.     This calculation has not been validated in all clinical situations.     eGFR's persistently <90 mL/min signify possible Chronic Kidney     Disease.  CBC WITH DIFFERENTIAL     Status: Abnormal   Collection Time    04/27/13 10:20 PM      Result Value Range   WBC 14.1 (*) 4.0 - 10.5 K/uL   RBC 5.13 (*) 3.87 - 5.11 MIL/uL   Hemoglobin 15.2 (*) 12.0 - 15.0  g/dL   HCT 16.1  09.6 - 04.5 %   MCV 85.0  78.0 - 100.0 fL   MCH 29.6  26.0 - 34.0 pg   MCHC 34.9  30.0 - 36.0 g/dL   RDW 40.9  81.1 - 91.4 %   Platelets 298  150 - 400 K/uL   Neutrophils Relative % 51  43 - 77 %   Lymphocytes Relative 38  12 - 46 %   Monocytes Relative 5  3 - 12 %   Eosinophils Relative 5  0 - 5 %   Basophils Relative 1  0 - 1 %   Neutro Abs 7.2  1.7 - 7.7 K/uL   Lymphs Abs 5.4 (*) 0.7 - 4.0 K/uL   Monocytes Absolute 0.7  0.1 - 1.0 K/uL   Eosinophils Absolute 0.7  0.0 - 0.7 K/uL   Basophils Absolute 0.1  0.0 - 0.1 K/uL   WBC Morphology MILD LEFT SHIFT (1-5% METAS, OCC MYELO, OCC BANDS)     Comment: ATYPICAL LYMPHOCYTES  URINALYSIS, ROUTINE W REFLEX MICROSCOPIC     Status: None   Collection Time    04/27/13 11:30 PM      Result Value Range   Color, Urine YELLOW  YELLOW   APPearance CLEAR  CLEAR   Specific Gravity, Urine 1.009  1.005 - 1.030   pH 5.0  5.0 - 8.0   Glucose, UA NEGATIVE  NEGATIVE mg/dL   Hgb urine dipstick NEGATIVE  NEGATIVE   Bilirubin Urine NEGATIVE  NEGATIVE   Ketones, ur NEGATIVE  NEGATIVE mg/dL   Protein, ur NEGATIVE  NEGATIVE mg/dL   Urobilinogen,  UA 0.2  0.0 - 1.0 mg/dL   Nitrite NEGATIVE  NEGATIVE   Leukocytes, UA NEGATIVE  NEGATIVE   Comment: MICROSCOPIC NOT DONE ON URINES WITH NEGATIVE PROTEIN, BLOOD, LEUKOCYTES, NITRITE, OR GLUCOSE <1000 mg/dL.  POCT PREGNANCY, URINE     Status: None   Collection Time    04/27/13 11:53 PM      Result Value Range   Preg Test, Ur NEGATIVE  NEGATIVE   Comment:            THE SENSITIVITY OF THIS     METHODOLOGY IS >24 mIU/mL  CG4 I-STAT (LACTIC ACID)     Status: None   Collection Time    04/27/13 11:57 PM      Result Value Range   Lactic Acid, Venous 1.83  0.5 - 2.2 mmol/L   Ct Abdomen Pelvis W Contrast  04/28/2013   CLINICAL DATA:  Lower abdominal pain and mass in groin that has been there for "months."  EXAM: CT ABDOMEN AND PELVIS WITH CONTRAST  TECHNIQUE: Multidetector CT imaging of the abdomen and pelvis was performed using the standard protocol following bolus administration of intravenous contrast.  CONTRAST:  OMNIPAQUE IOHEXOL 300 MG/ML  SOLN  COMPARISON:  None available for comparison at time of study interpretation.  FINDINGS: Included view of the lung bases demonstrate minimal dependent atelectasis. Visualized heart and pericardium are unremarkable.  The liver, spleen, pancreas and adrenal glands are unremarkable. The gallbladder is contracted and otherwise unremarkable.  The stomach, small and large bowel are normal in course and caliber without inflammatory changes. No intraperitoneal free fluid nor free air.  Kidneys are orthotopic, demonstrating symmetric enhancement without nephrolithiasis, hydronephrosis or renal masses. The unopacified ureters are normal in course and caliber. Delayed imaging through the kidneys demonstrates symmetric prompt excretion to the proximal urinary collecting system. Urinary bladder is partially distended and  unremarkable.  Great vessels are normal in course and caliber. No lymphadenopathy by CT size criteria. Internal reproductive organs are normal in  appearance, retroverted uterus. The soft tissues and included osseous structures are nonsuspicious.  3.6 x 4.3 cm left inguinal hernia containing fat demonstrates mild inflammatory changes without fluid collection. Minimal surrounding soft tissue reticulation with apparent varix. Tiny fat containing umbilical hernia. Grade 1 L5-S1 anterolisthesis associated with bilateral chronic L5 pars interarticularis defects.  IMPRESSION: 3.6 x 4.3 cm fat containing left inguinal hernia with mild inflammation, unclear whether this reflects superimposed possible cellulitis, no discrete mass or abscess.   Electronically Signed   By: Awilda Metro   On: 04/28/2013 00:35    Review of Systems  Constitutional: Negative.   HENT: Negative.   Eyes: Negative.   Respiratory: Negative.   Cardiovascular: Negative.   Gastrointestinal: Negative for nausea, vomiting, abdominal pain, diarrhea and constipation.  Genitourinary: Negative.   Musculoskeletal: Positive for back pain.  Skin: Negative.   Neurological: Negative.   Endo/Heme/Allergies: Negative.   Psychiatric/Behavioral: Negative.     Blood pressure 94/75, pulse 78, temperature 98.4 F (36.9 C), temperature source Oral, resp. rate 18, height 5\' 2"  (1.575 m), weight 100 lb 1.4 oz (45.4 kg), last menstrual period 04/03/2013, SpO2 98.00%. Physical Exam  Constitutional: She is oriented to person, place, and time. She appears well-developed and well-nourished. She appears distressed (mild).  HENT:  Head: Normocephalic and atraumatic.  Eyes: Conjunctivae are normal. Pupils are equal, round, and reactive to light. No scleral icterus.  Neck: Normal range of motion. Neck supple.  Cardiovascular: Normal rate, regular rhythm, normal heart sounds and intact distal pulses.  Exam reveals no gallop and no friction rub.   No murmur heard. Respiratory: Effort normal and breath sounds normal. No respiratory distress. She has no wheezes. She has no rales. She exhibits no  tenderness.  GI: Soft. Bowel sounds are normal. She exhibits no distension. There is no tenderness. There is no rebound and no guarding. A hernia is present. Hernia confirmed positive in the left inguinal area.  Genitourinary:  Small, soft, pt would not tolerate attempt at reduction.  No evidence of overlying cellulitis   Musculoskeletal: Normal range of motion.  Neurological: She is alert and oriented to person, place, and time. Coordination normal.  Skin: Skin is warm and dry. No rash noted. She is not diaphoretic. No erythema. No pallor.  Psychiatric: She has a normal mood and affect. Her behavior is normal. Judgment and thought content normal.     Assessment/Plan Incarcerated left inguinal hernia with omentum.  Plan NPO IVF Pain control To OR for [facility] with mesh.  Reviewed that open technique would be used.  I also advised that incarceration increase the risk of recurrence and infection. The examining emergency department staff felt that she did have some cellulitis. We have placed her on antibiotics in order to reduce the risk of postoperative infection  Back pain - think related to muscle spasm from attempts to position herself very still.  Would treat with tramadol.  May need muscle relaxant.  Advised that this may not improve with hernia repair.    Will discuss with Dr. Lindie Spruce.    Florice Hindle 04/28/2013, 5:52 AM

## 2013-04-28 NOTE — Progress Notes (Signed)
Spoke with the patient who is still symptomatic, but does not appear to be incarcerated.  Will take to the OR this afternoon.  Marta Lamas. Gae Bon, MD, FACS [phone] [facility]

## 2013-04-28 NOTE — Anesthesia Procedure Notes (Signed)
Procedure Name: LMA Insertion Date/Time: 04/28/2013 12:25 PM Performed by: Jerilee Hoh Pre-anesthesia Checklist: Patient identified, Emergency Drugs available, Suction available and Patient being monitored Patient Re-evaluated:Patient Re-evaluated prior to inductionOxygen Delivery Method: Circle system utilized Preoxygenation: Pre-oxygenation with 100% oxygen Intubation Type: IV induction LMA: LMA inserted LMA Size: 4.0 Tube type: Oral Number of attempts: 1 Placement Confirmation: positive ETCO2 and breath sounds checked- equal and bilateral Tube secured with: Tape Dental Injury: Teeth and Oropharynx as per pre-operative assessment

## 2013-04-29 LAB — CBC
MCH: 28.3 pg (ref 26.0–34.0)
MCHC: 33.2 g/dL (ref 30.0–36.0)
MCV: 85.3 fL (ref 78.0–100.0)
Platelets: 268 10*3/uL (ref 150–400)
WBC: 11.5 10*3/uL — ABNORMAL HIGH (ref 4.0–10.5)

## 2013-04-29 MED ORDER — HYDROCODONE-ACETAMINOPHEN 5-325 MG PO TABS: 1.0000 | ORAL_TABLET | Freq: Four times a day (QID) | ORAL | Status: DC | PRN

## 2013-04-29 MED ORDER — DSS 100 MG PO CAPS: 100.0000 mg | ORAL_CAPSULE | Freq: Two times a day (BID) | ORAL | Status: DC | PRN

## 2013-04-29 NOTE — Discharge Summary (Signed)
Physician Discharge Summary  Patient ID: Andrea King MRN: [id number] DOB/AGE: 1975/09/01 37 y.o.  Admit date: 04/27/2013 Discharge date: 04/29/2013  Admitting Diagnosis: Incarcerated left femoral hernia  Discharge Diagnosis Patient Active Problem List   Diagnosis Date Noted  . Incarcerated femoral hernia left 04/28/2013    Consultants None  Imaging: Ct Abdomen Pelvis W Contrast  04/28/2013   CLINICAL DATA:  Lower abdominal pain and mass in groin that has been there for "months."  EXAM: CT ABDOMEN AND PELVIS WITH CONTRAST  TECHNIQUE: Multidetector CT imaging of the abdomen and pelvis was performed using the standard protocol following bolus administration of intravenous contrast.  CONTRAST:  OMNIPAQUE IOHEXOL 300 MG/ML  SOLN  COMPARISON:  None available for comparison at time of study interpretation.  FINDINGS: Included view of the lung bases demonstrate minimal dependent atelectasis. Visualized heart and pericardium are unremarkable.  The liver, spleen, pancreas and adrenal glands are unremarkable. The gallbladder is contracted and otherwise unremarkable.  The stomach, small and large bowel are normal in course and caliber without inflammatory changes. No intraperitoneal free fluid nor free air.  Kidneys are orthotopic, demonstrating symmetric enhancement without nephrolithiasis, hydronephrosis or renal masses. The unopacified ureters are normal in course and caliber. Delayed imaging through the kidneys demonstrates symmetric prompt excretion to the proximal urinary collecting system. Urinary bladder is partially distended and unremarkable.  Great vessels are normal in course and caliber. No lymphadenopathy by CT size criteria. Internal reproductive organs are normal in appearance, retroverted uterus. The soft tissues and included osseous structures are nonsuspicious.  3.6 x 4.3 cm left inguinal hernia containing fat demonstrates mild inflammatory changes without fluid  collection. Minimal surrounding soft tissue reticulation with apparent varix. Tiny fat containing umbilical hernia. Grade 1 L5-S1 anterolisthesis associated with bilateral chronic L5 pars interarticularis defects.  IMPRESSION: 3.6 x 4.3 cm fat containing left inguinal hernia with mild inflammation, unclear whether this reflects superimposed possible cellulitis, no discrete mass or abscess.   Electronically Signed   By: Awilda Metro   On: 04/28/2013 00:35    Procedures Dr. Lindie Spruce (04/28/13) - Femoral hernia repair with mesh  Hospital Course:  37 year old female who has noticed a bulge in her left groin for approximately 2-3 months. She states this has always been soft and a little sore. She has not ever been able to make it go completely away during this time. Yesterday, she developed acute worsening of the pain and noted a hard knot in the left groin. She noted that this was about twice the size of the hernia previously. She tried to be very still but the pain did not get better. Given the emergency Department. CT was performed demonstrating some incarcerated omentum in the hernia. She denies fevers and chills.  Patient was admitted and underwent procedure listed above.  She was thought to have an inguinal hernia but she was found to have a left femoral hernia which was incarcerated.  Tolerated procedure well and was transferred to the floor.  Diet was advanced as tolerated.  On POD #1, the patient was voiding well, tolerating diet, ambulating well, pain well controlled, vital signs stable, incisions c/d/i and felt stable for discharge home.  Patient will follow up in our office in 2-3 weeks and knows to call with questions or concerns.  Physical Exam: General:  Alert, NAD, pleasant, comfortable Abd:  Soft, ND, mild tenderness, incisions C/D/I    Medication List         acetaminophen 500 MG tablet  Commonly known as:  TYLENOL  Take 1,000 mg by mouth every 6 (six) hours as needed for moderate  pain.     DSS 100 MG Caps  Take 100 mg by mouth 2 (two) times daily as needed for mild constipation.     HYDROcodone-acetaminophen 5-325 MG per tablet  Commonly known as:  NORCO/VICODIN  Take 1-2 tablets by mouth every 6 (six) hours as needed for moderate pain.     ibuprofen 200 MG tablet  Commonly known as:  ADVIL,MOTRIN  Take 600 mg by mouth every 6 (six) hours as needed for moderate pain.     traMADol 50 MG tablet  Commonly known as:  ULTRAM  Take 1 tablet (50 mg total) by mouth every 6 (six) hours as needed for pain.             Follow-up Information   Follow up with WYATT, Marta Lamas, MD. Call in 2 weeks. (THE OFFICE SHOULD CALL YOU WITH AN APPOINTMENT TIME, BUT IF YOU DON'T HEAR FROM THEM PLEASE CALL TO MAKE YOUR APPOINTMENT IN 2-3 WEEKS)    Specialty:  General Surgery   Contact information:   [address], [address]  [place] [address] [phone]       Signed: Aris Georgia, [facility] [phone]  04/29/2013, 9:52 AM

## 2013-04-29 NOTE — Progress Notes (Signed)
Reviewed discharge instructions and Rx's with pt. Pt reported she does not have any questions at this time. Pt stated her sister will be coming to get her this afternoon. Pt plans to eat lunch before discharging home. Pt currently still has breakfast tray in room that she did not eat. Pt stated she will try to start eating that until the lunch tray arrives. Pt has been ambulating from bed to bathroom without assistance. Pt ready for discharge.

## 2013-05-01 ENCOUNTER — Telehealth (INDEPENDENT_AMBULATORY_CARE_PROVIDER_SITE_OTHER): Payer: Self-pay

## 2013-05-01 ENCOUNTER — Encounter (HOSPITAL_COMMUNITY): Payer: Self-pay | Admitting: General Surgery

## 2013-05-01 NOTE — Telephone Encounter (Signed)
Left appointment info on VM.

## 2013-05-01 NOTE — Anesthesia Postprocedure Evaluation (Signed)
Anesthesia Post Note  Patient: Andrea King  Procedure(s) Performed: Procedure(s) (LRB): HERNIA REPAIR INGUINAL ADULT with mesh (Left)  Anesthesia type: general  Patient location: PACU  Post pain: Pain level controlled  Post assessment: Patient's Cardiovascular Status Stable  Last Vitals:  Filed Vitals:   04/29/13 1148  BP: 101/55  Pulse: 78  Temp: 37.3 C  Resp: 18    Post vital signs: Reviewed and stable  Level of consciousness: sedated  Complications: No apparent anesthesia complications

## 2013-05-05 ENCOUNTER — Telehealth (INDEPENDENT_AMBULATORY_CARE_PROVIDER_SITE_OTHER): Payer: Self-pay | Admitting: General Surgery

## 2013-05-05 ENCOUNTER — Other Ambulatory Visit (INDEPENDENT_AMBULATORY_CARE_PROVIDER_SITE_OTHER): Payer: Self-pay | Admitting: General Surgery

## 2013-05-05 DIAGNOSIS — Z9889 Other specified postprocedural states: Principal | ICD-10-CM

## 2013-05-05 DIAGNOSIS — Z8719 Personal history of other diseases of the digestive system: Secondary | ICD-10-CM

## 2013-05-05 MED ORDER — HYDROCODONE-ACETAMINOPHEN 5-325 MG PO TABS: 1.0000 | ORAL_TABLET | Freq: Four times a day (QID) | ORAL | Status: DC | PRN

## 2013-05-05 NOTE — Telephone Encounter (Signed)
Pt called for post op med refill.  Per protocol, Norco 5/ 325 mg, # 30 (thirty), 1-2 po Q4-6H, no refill written and signed for Dr. Lindie SpruceWyatt (vac) by Dr. Maisie Fushomas.  Pt aware to pick up at the front desk.

## 2013-05-18 ENCOUNTER — Encounter (INDEPENDENT_AMBULATORY_CARE_PROVIDER_SITE_OTHER): Payer: Self-pay | Admitting: General Surgery

## 2013-05-29 ENCOUNTER — Encounter (INDEPENDENT_AMBULATORY_CARE_PROVIDER_SITE_OTHER): Payer: Self-pay | Admitting: General Surgery

## 2014-06-27 ENCOUNTER — Inpatient Hospital Stay: Payer: Self-pay | Admitting: Internal Medicine

## 2014-06-28 DIAGNOSIS — I340 Nonrheumatic mitral (valve) insufficiency: Secondary | ICD-10-CM

## 2014-09-02 NOTE — Discharge Summary (Signed)
PATIENT NAME:  Andrea King, Andrea King MR#:  [id number] DATE OF BIRTH:  [birth year]  DATE OF ADMISSION:  06/27/2014 DATE OF DISCHARGE:  06/29/2014  PRIMARY CARE PHYSICIAN:  Unknown.  FINAL DIAGNOSES: 1.  Clinical sepsis, multifocal pneumonia, swelling and cellulitis of the right arm.  2.  Rib fracture.  3.  Anxiety and depression.  4.  History of drug abuse.  MEDICATIONS:  Upon signing out against advise: 1.  Augmentin 875 mg b.i.d. x 10 days. 2.  Doxycycline 100 mg twice a day x 10 days, given by Dr. Clint GuyHower, physician on call when the patient signed out against advice.  OTHER MEDICATIONS THAT THE PATIENT TOOK ON A DAILY BASIS: Included: 1.  Xanax 1 mg 4 times a day as needed for anxiety. 2.  Celexa 40 mg daily. 3.  Tylenol as needed.   HOSPITAL COURSE: The patient was admitted 06/27/2014 and signed out AGAINST MEDICAL ADVICE 06/29/2014 close to midnight.  She came in with right arm cut and swelling for the past 7 days.   HISTORY OF PRESENT ILLNESS: A 39 year old female with history of hepatitis C, bipolar disorder, anxiety, chronic obstructive pulmonary disease, asthma, depression, and hyperlipidemia. She presented with her right arm being cut, fever, chills, vomiting, wheezing, coughing and was admitted with clinical sepsis and pneumonia, right arm cellulitis. Cocaine found in the urine.   LABORATORY AND RADIOLOGICAL DATA DURING THE HOSPITAL COURSE: Included blood cultures showed no growth. Troponin negative. White blood cell count 19.1, hemoglobin and hematocrit 12.7 and 39.4, platelet count of 357,000, glucose 85, BUN 7, creatinine 0.69, sodium 133, potassium 3.8, chloride 100, CO2 of 27, calcium 8.8. Liver function tests: ALT up at 78, AST up at 43, urine toxicology positive for TCA, cocaine, opiates, and benzodiazepine. Urinalysis negative. Urine culture negative. CT scan of the chest showed no evidence of pulmonary embolism, multifocal patchy nodular opacities in the right lower lobe, favored  to reflect a multifocal pneumonia. Follow-up chest radiographs suggested in 3-4 weeks. CT scan of the right forearm was negative. HIV test negative. Echocardiogram no evidence of valve vegetation, ejection fraction 60%-65%. Right x-ray showed right posterior 12th rib fracture, internal cavitation of nodular opacity within the superior segment of the right lower lobe. White count upon signing out against medical advice was 15.1.  On 06/28/2014 PICC line was in correct place.  HOSPITAL COURSE PER PROBLEM LIST:  1.  The patient was admitted on 06/27/2014 and signed out against medical advice on 06/29/2014.  She was admitted with clinical sepsis, pneumonia, blood cultures were negative.  The patient was given aggressive antibiotics while here. Upon signing out against medical advice my associate wrote prescriptions for doxycycline and Augmentin. Follow-up radiographs are needed. Unfortunately, I do not know the patient's primary care physician to ensure clearing of these pneumonia.  The patient also did have a rib fracture. The patient was asking for a tremendous amount of pain medications here. She was ordered oxycodone orally, Tylenol also and then morphine 2-4 mg IV q. 4 hours which was still not enough pain medication for this patient.  2.  Anxiety or depression. No changes in her psychiatric medications.  3.  History of drug abuse. The patient denied using cocaine even though cocaine was found in the urine. The patient did have a PICC line and wanted to walk around the nursing station. My associate would not allow that with her PICC line and then she signed out against medical advice.  PICC line was removed prior to signing out.  My associate explained the risk of death to the patient if she left at this particular point in time.    ____________________________ Herschell Dimes. Renae Gloss, MD rjw:mc D: 07/03/2014 11:03:50 ET T: 07/03/2014 11:20:31 ET JOB#: [id number]  cc: Herschell Dimes. Renae Gloss, MD,  <Dictator> Salley Scarlet MD ELECTRONICALLY SIGNED 07/11/2014 10:46

## 2014-09-02 NOTE — Consult Note (Signed)
Brief Consult Note: Diagnosis: Forearm abscess, multifocal pna, possible endocarditis.   Patient was seen by consultant.   Consult note dictated.   Recommend to proceed with surgery or procedure.   Recommend further assessment or treatment.   Orders entered.   Comments: Cont current abx check echo Check HIV Swab wound for culture.  Electronic Signatures: Dierdre HarnessFitzgerald, Johnathyn Viscomi Patrick (MD)  (Signed 25-Feb-16 12:04)  Authored: Brief Consult Note   Last Updated: 25-Feb-16 12:04 by Dierdre HarnessFitzgerald, Zuzu Befort Patrick (MD)

## 2014-09-02 NOTE — Consult Note (Signed)
PATIENT NAME:  Andrea King, Andrea L MR#:  [id number] DATE OF BIRTH:  23-Sep-1975  DATE OF CONSULTATION:  06/28/2014  CONSULTING PHYSICIAN:  Stann Mainlandavid P. Sampson GoonFitzgerald, MD  REFERRING PHYSICIAN: Shaune PollackQing Chen, MD  REASON FOR CONSULTATION: Sepsis, abscess, and possible endocarditis.   HISTORY OF PRESENT ILLNESS: A 39 year old female with a history of prior drug abuse as well as asthma and hepatitis C who was admitted 06/27/2014 with infection in the right arm. She cut her arm through a window several days again and it became red and swollen. It started to drain. She was also having cough with black sputum and right-sided chest pain. The patient denies any active IV drug use but has used heroin in the past. She did have a positive toxicology screen for cocaine.   PAST MEDICAL HISTORY: Hepatitis C, bipolar disorder, anxiety, chronic obstructive pulmonary disease, asthma, depression, and hyperlipidemia.   PAST SURGICAL HISTORY: Removal of a tumor on the right side of her back which was benign.   SOCIAL HISTORY: Heavy smoking. Occasional drinking. Occasional marijuana, cocaine and heroin in the past. She denies any recent use. She is sexually active with 1 partner and lives with her boyfriend.   FAMILY HISTORY: Mother with lung cancer. Grandfather with lung cancer. Father with prostate cancer.   ALLERGIES: No known drug allergies.   ANTIBIOTICS SINCE ADMISSION: Include vancomycin and Zosyn.   REVIEW OF SYSTEMS:  Eleven systems reviewed and negative except as per history of present illness.   PHYSICAL EXAMINATION:  VITAL SIGNS: The patient is quite disheveled and groggy. Temperature 98.6, pulse 82, blood pressure 93/56, respirations 18, saturation 92% on room air.  HEENT: Pupils are reactive. Sclerae anicteric. Oropharynx clear.  NECK: Supple.  HEART: Tachy but regular with 1/6 systolic murmur.  LUNGS: Decreased breath sounds throughout, mild crackles on the right.  ABDOMEN: Soft, nontender, and mildly  distended.  EXTREMITIES: No clubbing, cyanosis, or edema.  SKIN: She has multiple excoriations and scratches. On her right forearm she had an opening through which some serosanguineous drainage is able to be expressed. There is some surrounding induration and erythema.   LABORATORY DATA: Blood cultures x2 are pending and are no growth to date. White count on admission was 19.8 on 06/27/2014 and 18.2 on 06/28/2014, hemoglobin 11.2, platelets 305,000. Urine toxicology screen positive for TCA, opiates, cocaine, benzodiazepine. HIV test is nonreactive. Urinalysis is negative. Urine culture is negative.   IMAGING: CT of the chest shows no evidence of PE. There is multifocal patchy nodular opacity in the right lower lung favored to reflect multifocal pneumonia. CT of the forearm on 06/27/2014 reveals no bony abnormalities. There is soft tissue defect corresponding to the laceration of the right forearm with underlying edema and cellulitic changes. There is no obvious abscess.   IMPRESSION: A 39 year old with a history of hepatitis C as well as polysubstance abuse, admitted with abscess and draining on her right forearm as well as multifocal pneumonia and elevated white count. She is HIV negative. She does have a history of hepatitis C. She denies active IV drug use but given her positive drug screen this is to be questioned.   RECOMMENDATIONS:  1.  Continue vancomycin and Zosyn pending cultures.  2.  Check echocardiogram. She may need a TEE. 3.  I have ordered a culture for drainage from her arm.  Thank you for the consult. I will be glad to follow with you.    ____________________________ Stann Mainlandavid P. Sampson GoonFitzgerald, MD dpf:mc D: 06/28/2014 13:54:54 ET T: 06/28/2014 15:52:32  ET JOB#: [id number]  cc: Stann Mainland. Sampson Goon, MD, <Dictator> Lilliana Turner Sampson Goon MD ELECTRONICALLY SIGNED 07/05/2014 19:53

## 2014-09-02 NOTE — H&P (Signed)
PATIENT NAME:  Andrea King, PARISH MR#:  [id number] DATE OF BIRTH:  January 20, 1976  DATE OF ADMISSION:  06/27/2014  REFERRING PHYSICIAN:  Dr. Lenard Lance.   CHIEF COMPLAINT: Right arm cut and pain and swelling for the past seven days.   HISTORY OF PRESENT ILLNESS: A 39 year old Caucasian female with a history of drug abuse and asthma, who presented to the ED with the above chief complaint. The patient is alert, awake, oriented, in no acute distress. The patient said that she cut her right arm through the window, seven days ago, and the right arm became swollen and tender. In addition, the patient has had fever and chills. She vomited once yesterday.  In addition, the patient also complains of cough with black sputum and right-sided chest pain, exacerbated by wheezing and a cough. The patient denies any abdominal pain or diarrhea. No melena or bloody stool. The patient denies any dysuria or hematuria.   PAST MEDICAL HISTORY:  Hepatitis C, bipolar disorder, anxiety, COPD, asthma, depression, and hyperlipidemia.   PAST SURGICAL HISTORY: Benign tumor removal from right side of back.   SOCIAL HISTORY: Smoked since teenager, a pack per day for many years. Occasional drinking, remote use of marijuana, cocaine, and heroin, but the patient denied any recent use of illicit drugs.   PERSONAL HISTORY:  The patient is sexually active without protection, 1 partner.  She denies any sexually transmitted disease, but has never tested for HIV or gonorrhea.   FAMILY HISTORY: The patient's mother died of lung cancer and grandfather died of lung cancer.  Father had prostate cancer.   ALLERGIES:  None.  HOME MEDICATIONS: Tylenol 500 mg p.o. 2 tablets every six hours p.r.n., citalopram 40 mg p.o. once a day, Xanax 1 mg 4 times a day p.r.n.   REVIEW OF SYSTEMS:    CONSTITUTIONAL: The patient has had fever and chills. No headache or dizziness or weakness.  EYES: No double vision, blurry vision.  EARS, NOSE, AND THROAT: No  postnasal drip, slurred speech or dysphagia.  CARDIOVASCULAR: Positive for chest pain on the right side. No palpitations, no orthopnea, or nocturnal dyspnea. No leg edema.  PULMONARY: Positive for cough, black sputum. No wheezing or hemoptysis, but has shortness of breath and chest pain exacerbated by wheezing and coughing.  GASTROINTESTINAL: No abdominal pain, but has nausea or vomiting. No diarrhea. No melena or bloody stool.   GENITOURINARY: No dysuria, hematuria, or incontinence.  SKIN: No rash or jaundice.  NEUROLOGIC: No syncope, loss of consciousness, or seizure.  ENDOCRINE: No polyuria, polydipsia, heat or cold intolerance.  HEMATOLOGY: No easy bruising or bleeding.  MUSCULOSKELETAL: Right arm pain, swelling and erythema.   PHYSICAL EXAMINATION:  VITAL SIGNS: Temperature 99.5, blood pressure 97/50, pulse 87, oxygen saturation 98% on room air.  GENERAL: The patient is alert, awake, oriented, in no acute distress.  HEENT: Pupils round, equal and reactive to light and accommodation. Moist oral mucosa. Clear oropharynx.  NECK: Supple. No JVD or carotid bruit. No lymphadenopathy. No thyromegaly.  CARDIOVASCULAR: S1 and S2. Regular rate and rhythm. No murmurs or gallops.  PULMONARY: Bilateral air entry, bilateral crackles especially on the right side, but no wheezing. No use of accessory muscle to breathe.  ABDOMEN: Soft. No distention or tenderness. No organomegaly. Bowel sounds are present.  EXTREMITIES:  No edema, clubbing or cyanosis. No calf tenderness, bilateral pedal pulses present. There is erythema and swelling and tenderness on the right arm between elbow and the wrist.  SKIN: No rash or jaundice.  NEUROLOGIC: Alert and oriented x3. No focal deficit. Power 5/5. Sensory intact.   LABORATORY DATA:  CT of right arm with contrast:  no acute bony abnormality, no discrete abscess. No foreign body.  CT angio of chest showed no evidence of a PE, but has a multifocal patchy nodule opacity  in the right lower lobe, favored to reflect a multifocal pneumonia. Urinalysis is negative. Urine toxicology showed tricyclic antidepressant positive, cocaine positive, opiate positive, benzodiazepine positive. Glucose 85, BUN 7, creatinine 0.69, sodium 133, potassium 3.8, chloride 100, and bicarbonate 27. CBC showed WBC 19.1, hemoglobin 5.7, platelets 357. Troponin less than 0.02.   IMPRESSIONS:  1.  Sepsis with the right side pneumonia.  2.  Right arm cellulitis without abscess.  3.  Drug abuse including cocaine abuse.  4.  Tobacco abuse.  5.  Hyponatremia.  6.  History of depression and anxiety.  7.  Chronic obstructive pulmonary disease.   PLAN OF TREATMENT:  1.  The patient will be admitted to a medical floor.  2.  The patient was treated with the clindamycin, vancomycin and Zosyn ED. We will continue Zosyn and vancomycin; follow up her blood culture and sputum culture and the CBC. I will get an infectious disease consult.  3.  In addition, I will get HIV test and give Zithromax IV.  4.  For hyponatremia given normal saline IV, follow up BMP.  5.  For drug abuse, the patient was for counseled for drug abuse cessation.  6.  For tobacco abuse, smoking cessation was counseled for 4 to 5 minutes. We will give nicotine patch.  7.  Since the patient has drug abuse with pneumonia, the patient has a high risk for possible endocarditis. We will follow up blood culture.  The patient may need a TEE, but I will defer to Dr. Sampson GoonFitzgerald recommendation. Discussed the patient's condition and plan of treatment with the patient.   TIME SPENT: About 62 minutes    ____________________________ Shaune PollackQing Jung Yurchak, MD qc:at D: 06/27/2014 19:15:00 ET T: 06/27/2014 19:45:10 ET JOB#: [id number]  cc: Shaune PollackQing Malyna Budney, MD, <Dictator> Shaune PollackQING Brit Wernette MD ELECTRONICALLY SIGNED 06/30/2014 18:12

## 2014-09-27 ENCOUNTER — Encounter: Payer: Self-pay | Admitting: Obstetrics & Gynecology

## 2015-11-13 ENCOUNTER — Encounter: Payer: Self-pay | Admitting: Emergency Medicine

## 2015-11-13 ENCOUNTER — Emergency Department: Payer: Medicaid Other

## 2015-11-13 ENCOUNTER — Inpatient Hospital Stay
Admission: EM | Admit: 2015-11-13 | Discharge: 2015-11-20 | DRG: 871 | Discharge status: Against Medical Advice | Payer: Medicaid Other | Attending: Internal Medicine | Admitting: Internal Medicine

## 2015-11-13 ENCOUNTER — Inpatient Hospital Stay: Admit: 2015-11-13 | Payer: Medicaid Other

## 2015-11-13 DIAGNOSIS — D649 Anemia, unspecified: Secondary | ICD-10-CM | POA: Diagnosis present

## 2015-11-13 DIAGNOSIS — E871 Hypo-osmolality and hyponatremia: Secondary | ICD-10-CM | POA: Diagnosis present

## 2015-11-13 DIAGNOSIS — J189 Pneumonia, unspecified organism: Secondary | ICD-10-CM | POA: Diagnosis present

## 2015-11-13 DIAGNOSIS — F1110 Opioid abuse, uncomplicated: Secondary | ICD-10-CM

## 2015-11-13 DIAGNOSIS — I330 Acute and subacute infective endocarditis: Secondary | ICD-10-CM | POA: Diagnosis present

## 2015-11-13 DIAGNOSIS — L0292 Furuncle, unspecified: Secondary | ICD-10-CM | POA: Diagnosis present

## 2015-11-13 DIAGNOSIS — M869 Osteomyelitis, unspecified: Secondary | ICD-10-CM

## 2015-11-13 DIAGNOSIS — A4102 Sepsis due to Methicillin resistant Staphylococcus aureus: Principal | ICD-10-CM | POA: Diagnosis present

## 2015-11-13 DIAGNOSIS — E876 Hypokalemia: Secondary | ICD-10-CM | POA: Diagnosis present

## 2015-11-13 DIAGNOSIS — M1990 Unspecified osteoarthritis, unspecified site: Secondary | ICD-10-CM | POA: Diagnosis present

## 2015-11-13 DIAGNOSIS — Z95828 Presence of other vascular implants and grafts: Secondary | ICD-10-CM

## 2015-11-13 DIAGNOSIS — F1193 Opioid use, unspecified with withdrawal: Secondary | ICD-10-CM

## 2015-11-13 DIAGNOSIS — F1994 Other psychoactive substance use, unspecified with psychoactive substance-induced mood disorder: Secondary | ICD-10-CM

## 2015-11-13 DIAGNOSIS — Z833 Family history of diabetes mellitus: Secondary | ICD-10-CM | POA: Diagnosis not present

## 2015-11-13 DIAGNOSIS — F1123 Opioid dependence with withdrawal: Secondary | ICD-10-CM | POA: Diagnosis present

## 2015-11-13 DIAGNOSIS — B1920 Unspecified viral hepatitis C without hepatic coma: Secondary | ICD-10-CM | POA: Diagnosis present

## 2015-11-13 DIAGNOSIS — F419 Anxiety disorder, unspecified: Secondary | ICD-10-CM | POA: Diagnosis present

## 2015-11-13 DIAGNOSIS — F17210 Nicotine dependence, cigarettes, uncomplicated: Secondary | ICD-10-CM | POA: Diagnosis present

## 2015-11-13 DIAGNOSIS — R7881 Bacteremia: Secondary | ICD-10-CM | POA: Insufficient documentation

## 2015-11-13 DIAGNOSIS — A419 Sepsis, unspecified organism: Secondary | ICD-10-CM | POA: Diagnosis present

## 2015-11-13 HISTORY — DX: Unspecified osteoarthritis, unspecified site: M19.90

## 2015-11-13 HISTORY — DX: Other psychoactive substance abuse, uncomplicated: F19.10

## 2015-11-13 HISTORY — DX: Anemia, unspecified: D64.9

## 2015-11-13 HISTORY — DX: Unspecified viral hepatitis C without hepatic coma: B19.20

## 2015-11-13 HISTORY — DX: Unspecified asthma, uncomplicated: J45.909

## 2015-11-13 LAB — URINE DRUG SCREEN, QUALITATIVE (ARMC ONLY)
Amphetamines, Ur Screen: POSITIVE — AB
Barbiturates, Ur Screen: NOT DETECTED
Benzodiazepine, Ur Scrn: POSITIVE — AB
CANNABINOID 50 NG, UR ~~LOC~~: NOT DETECTED
COCAINE METABOLITE, UR ~~LOC~~: POSITIVE — AB
MDMA (ECSTASY) UR SCREEN: NOT DETECTED
METHADONE SCREEN, URINE: NOT DETECTED
OPIATE, UR SCREEN: POSITIVE — AB
Phencyclidine (PCP) Ur S: NOT DETECTED
TRICYCLIC, UR SCREEN: NOT DETECTED

## 2015-11-13 LAB — BLOOD CULTURE ID PANEL (REFLEXED)
ACINETOBACTER BAUMANNII: NOT DETECTED
CANDIDA ALBICANS: NOT DETECTED
CANDIDA GLABRATA: NOT DETECTED
CANDIDA TROPICALIS: NOT DETECTED
Candida krusei: NOT DETECTED
Candida parapsilosis: NOT DETECTED
Carbapenem resistance: NOT DETECTED
ENTEROBACTER CLOACAE COMPLEX: NOT DETECTED
ENTEROBACTERIACEAE SPECIES: NOT DETECTED
ENTEROCOCCUS SPECIES: NOT DETECTED
ESCHERICHIA COLI: NOT DETECTED
HAEMOPHILUS INFLUENZAE: NOT DETECTED
Klebsiella oxytoca: NOT DETECTED
Klebsiella pneumoniae: NOT DETECTED
Listeria monocytogenes: NOT DETECTED
METHICILLIN RESISTANCE: DETECTED — AB
NEISSERIA MENINGITIDIS: NOT DETECTED
PROTEUS SPECIES: NOT DETECTED
PSEUDOMONAS AERUGINOSA: NOT DETECTED
STAPHYLOCOCCUS AUREUS BCID: DETECTED — AB
STREPTOCOCCUS PNEUMONIAE: NOT DETECTED
STREPTOCOCCUS PYOGENES: NOT DETECTED
STREPTOCOCCUS SPECIES: NOT DETECTED
Serratia marcescens: NOT DETECTED
Staphylococcus species: NOT DETECTED
Streptococcus agalactiae: NOT DETECTED
VANCOMYCIN RESISTANCE: NOT DETECTED

## 2015-11-13 LAB — BASIC METABOLIC PANEL
Anion gap: 10 (ref 5–15)
BUN: 6 mg/dL (ref 6–20)
CALCIUM: 8.5 mg/dL — AB (ref 8.9–10.3)
CO2: 23 mmol/L (ref 22–32)
Chloride: 96 mmol/L — ABNORMAL LOW (ref 101–111)
Creatinine, Ser: 0.67 mg/dL (ref 0.44–1.00)
GFR calc Af Amer: >60 mL/min (ref >60)
Glucose, Bld: 156 mg/dL — ABNORMAL HIGH (ref 65–99)
Potassium: 2.9 mmol/L — ABNORMAL LOW (ref 3.5–5.1)
Sodium: 129 mmol/L — ABNORMAL LOW (ref 135–145)

## 2015-11-13 LAB — URINALYSIS COMPLETE WITH MICROSCOPIC (ARMC ONLY)
Bilirubin Urine: NEGATIVE
Glucose, UA: NEGATIVE mg/dL
Ketones, ur: NEGATIVE mg/dL
LEUKOCYTES UA: NEGATIVE
Nitrite: NEGATIVE
PH: 5 (ref 5.0–8.0)
PROTEIN: 100 mg/dL — AB
Specific Gravity, Urine: 1.02 (ref 1.005–1.030)

## 2015-11-13 LAB — CBC
HCT: 35.1 % (ref 35.0–47.0)
Hemoglobin: 12 g/dL (ref 12.0–16.0)
MCH: 26.9 pg (ref 26.0–34.0)
MCHC: 34.2 g/dL (ref 32.0–36.0)
MCV: 78.6 fL — ABNORMAL LOW (ref 80.0–100.0)
Platelets: 285 10*3/uL (ref 150–440)
RBC: 4.46 MIL/uL (ref 3.80–5.20)
RDW: 16 % — AB (ref 11.5–14.5)
WBC: 27 10*3/uL — ABNORMAL HIGH (ref 3.6–11.0)

## 2015-11-13 LAB — RAPID HIV SCREEN (HIV 1/2 AB+AG)
HIV 1/2 Antibodies: NONREACTIVE
HIV-1 P24 Antigen - HIV24: NONREACTIVE

## 2015-11-13 LAB — PREGNANCY, URINE: PREG TEST UR: NEGATIVE

## 2015-11-13 LAB — TROPONIN I: Troponin I: <0.03 ng/mL (ref <0.03)

## 2015-11-13 LAB — PROCALCITONIN: PROCALCITONIN: 0.55 ng/mL

## 2015-11-13 LAB — LACTIC ACID, PLASMA
Lactic Acid, Venous: 1.6 mmol/L (ref 0.5–1.9)
Lactic Acid, Venous: 2.2 mmol/L (ref 0.5–1.9)

## 2015-11-13 LAB — MAGNESIUM: Magnesium: 2 mg/dL (ref 1.7–2.4)

## 2015-11-13 MED ORDER — SODIUM CHLORIDE 0.9 % IV BOLUS (SEPSIS)
1000.0000 mL | Freq: Once | INTRAVENOUS | Status: AC
  Administered 2015-11-13: 1000 mL via INTRAVENOUS

## 2015-11-13 MED ORDER — METHADONE HCL 10 MG/ML PO CONC
30.0000 mg | Freq: Every day | ORAL | Status: AC
  Administered 2015-11-14 – 2015-11-15 (×2): 30 mg via ORAL
  Filled 2015-11-13 (×2): qty 3

## 2015-11-13 MED ORDER — ADULT MULTIVITAMIN W/MINERALS CH
1.0000 | ORAL_TABLET | Freq: Every day | ORAL | Status: DC
  Administered 2015-11-14 – 2015-11-19 (×6): 1 via ORAL
  Filled 2015-11-13 (×6): qty 1

## 2015-11-13 MED ORDER — METHADONE 0.4 MG/ML ORAL SOLUTION
30.0000 mg | ORAL | Status: DC
  Filled 2015-11-13: qty 75

## 2015-11-13 MED ORDER — PIPERACILLIN-TAZOBACTAM 3.375 G IVPB
3.3750 g | Freq: Three times a day (TID) | INTRAVENOUS | Status: DC
  Administered 2015-11-13 (×2): 3.375 g via INTRAVENOUS
  Filled 2015-11-13 (×4): qty 50

## 2015-11-13 MED ORDER — POTASSIUM CHLORIDE 10 MEQ/100ML IV SOLN
10.0000 meq | INTRAVENOUS | Status: AC
  Administered 2015-11-13 (×4): 10 meq via INTRAVENOUS
  Filled 2015-11-13 (×4): qty 100

## 2015-11-13 MED ORDER — PIPERACILLIN-TAZOBACTAM 3.375 G IVPB 30 MIN
3.3750 g | Freq: Once | INTRAVENOUS | Status: AC
  Administered 2015-11-13: 3.375 g via INTRAVENOUS
  Filled 2015-11-13: qty 50

## 2015-11-13 MED ORDER — SODIUM CHLORIDE 0.9% FLUSH
3.0000 mL | Freq: Two times a day (BID) | INTRAVENOUS | Status: DC
  Administered 2015-11-18 – 2015-11-19 (×2): 3 mL via INTRAVENOUS

## 2015-11-13 MED ORDER — VANCOMYCIN HCL IN DEXTROSE 1-5 GM/200ML-% IV SOLN
1000.0000 mg | Freq: Once | INTRAVENOUS | Status: AC
  Administered 2015-11-13: 1000 mg via INTRAVENOUS
  Filled 2015-11-13: qty 200

## 2015-11-13 MED ORDER — IBUPROFEN 800 MG PO TABS
800.0000 mg | ORAL_TABLET | Freq: Once | ORAL | Status: AC
  Administered 2015-11-13: 800 mg via ORAL

## 2015-11-13 MED ORDER — IBUPROFEN 800 MG PO TABS
ORAL_TABLET | ORAL | Status: AC
  Administered 2015-11-13: 800 mg via ORAL
  Filled 2015-11-13: qty 1

## 2015-11-13 MED ORDER — VANCOMYCIN HCL IN DEXTROSE 750-5 MG/150ML-% IV SOLN
750.0000 mg | Freq: Two times a day (BID) | INTRAVENOUS | Status: DC
  Administered 2015-11-13 – 2015-11-15 (×5): 750 mg via INTRAVENOUS
  Filled 2015-11-13 (×6): qty 150

## 2015-11-13 MED ORDER — LIDOCAINE 5 % EX PTCH
1.0000 | MEDICATED_PATCH | Freq: Once | CUTANEOUS | Status: AC
  Administered 2015-11-13: 1 via TRANSDERMAL

## 2015-11-13 MED ORDER — LIDOCAINE 5 % EX PTCH
1.0000 | MEDICATED_PATCH | CUTANEOUS | Status: DC
  Administered 2015-11-13 – 2015-11-19 (×8): 1 via TRANSDERMAL
  Filled 2015-11-13 (×9): qty 1

## 2015-11-13 MED ORDER — METHADONE HCL 10 MG/ML PO CONC
30.0000 mg | Freq: Once | ORAL | Status: AC
  Administered 2015-11-13: 30 mg via ORAL
  Filled 2015-11-13: qty 3

## 2015-11-13 MED ORDER — POTASSIUM CHLORIDE CRYS ER 20 MEQ PO TBCR
40.0000 meq | EXTENDED_RELEASE_TABLET | Freq: Two times a day (BID) | ORAL | Status: DC
  Administered 2015-11-13: 40 meq via ORAL
  Filled 2015-11-13 (×9): qty 2

## 2015-11-13 MED ORDER — SODIUM CHLORIDE 0.9 % IV BOLUS (SEPSIS): 500.0000 mL | Freq: Once | INTRAVENOUS | Status: DC

## 2015-11-13 MED ORDER — ONDANSETRON HCL 4 MG/2ML IJ SOLN: 4.0000 mg | Freq: Four times a day (QID) | INTRAMUSCULAR | Status: DC | PRN

## 2015-11-13 MED ORDER — SODIUM CHLORIDE 0.9 % IV SOLN
INTRAVENOUS | Status: DC
  Administered 2015-11-13 – 2015-11-19 (×11): via INTRAVENOUS

## 2015-11-13 MED ORDER — ACETAMINOPHEN 325 MG PO TABS
650.0000 mg | ORAL_TABLET | Freq: Four times a day (QID) | ORAL | Status: DC | PRN
  Administered 2015-11-13 – 2015-11-15 (×8): 650 mg via ORAL
  Filled 2015-11-13 (×9): qty 2

## 2015-11-13 MED ORDER — ONDANSETRON HCL 4 MG PO TABS
4.0000 mg | ORAL_TABLET | Freq: Four times a day (QID) | ORAL | Status: DC | PRN
  Administered 2015-11-18 (×2): 4 mg via ORAL
  Filled 2015-11-13 (×2): qty 1

## 2015-11-13 MED ORDER — ENOXAPARIN SODIUM 40 MG/0.4ML ~~LOC~~ SOLN
40.0000 mg | SUBCUTANEOUS | Status: DC
  Administered 2015-11-19: 40 mg via SUBCUTANEOUS
  Filled 2015-11-13 (×5): qty 0.4

## 2015-11-13 MED ORDER — METHADONE 0.4 MG/ML ORAL SOLUTION: 30.0000 mg | Freq: Every day | ORAL | Status: DC

## 2015-11-13 MED ORDER — ACETAMINOPHEN 650 MG RE SUPP: 650.0000 mg | Freq: Four times a day (QID) | RECTAL | Status: DC | PRN

## 2015-11-13 MED ORDER — DOCUSATE SODIUM 100 MG PO CAPS
100.0000 mg | ORAL_CAPSULE | Freq: Two times a day (BID) | ORAL | Status: DC
  Administered 2015-11-15 – 2015-11-18 (×6): 100 mg via ORAL
  Filled 2015-11-13 (×10): qty 1

## 2015-11-13 MED ORDER — SODIUM CHLORIDE 0.9 % IV BOLUS (SEPSIS): 250.0000 mL | Freq: Once | INTRAVENOUS | Status: DC

## 2015-11-13 MED ORDER — PIPERACILLIN-TAZOBACTAM 3.375 G IVPB 30 MIN: 3.3750 g | Freq: Once | INTRAVENOUS | Status: DC

## 2015-11-13 MED ORDER — VANCOMYCIN HCL IN DEXTROSE 1-5 GM/200ML-% IV SOLN: 1000.0000 mg | Freq: Once | INTRAVENOUS | Status: DC

## 2015-11-13 MED ORDER — LIDOCAINE 5 % EX PTCH
MEDICATED_PATCH | CUTANEOUS | Status: AC
  Administered 2015-11-13: 1 via TRANSDERMAL
  Filled 2015-11-13: qty 1

## 2015-11-13 NOTE — Consult Note (Signed)
[facility] Consult   Reason for Consult:  Consult for this 40 year old woman currently in the hospital with probable sepsis related to a pulmonary process. History of opiate abuse. Complaining of pain. Referring Physician:  Anselm Jungling Patient Identification: Andrea King MRN:  [id number] Principal Diagnosis: Opiate withdrawal Diagnosis:   Patient Active Problem List   Diagnosis Date Noted  . Sepsis ([place]) [A41.9] 11/13/2015  . Community acquired pneumonia [J18.9] 11/13/2015  . Pneumonia [J18.9] 11/13/2015  . Substance induced mood disorder ([place]) [F19.94] 11/13/2015  . Opiate withdrawal ([place]) [F11.23] 11/13/2015  . Opiate abuse, continuous [F11.10] 11/13/2015  . Incarcerated femoral hernia left [K41.30] 04/28/2013    Total Time spent with patient: 1 hour  Subjective:   Andrea King is a 40 y.o. female patient admitted with "I am in pain, can you help me?".  HPI:  Patient interviewed. Chart reviewed including current and old her chart. Labs and vitals reviewed including chest CT scan. 40 year old woman presented to the emergency room complaining of chest and back pain and shortness of breath. Workup shows what appears to be a likely infectious process in the lungs. Further workup still in place. Consult because she is complaining of pain and has a known history of opiate abuse. On interview today I found the patient only partially cooperative. When I saw her through the window and had not come into the room yet she appeared to be lying calmly. Once I came in and started talking to her she became very agitated and started moaning and writhing and shouting about pain. Patient reports that she has severe pain in her back and ribs which has been present for about a week. She says her mood is bad but relates this to her physical discomfort. Denied any actual suicidal ideation but made comments about how she didn't want to live in her current state anymore. No intent or plan of  actually harming herself. Denies having any hallucinations. No evident paranoia. Has not been sleeping or eating well recently. Patient admits that she abuses intravenous heroin but insists to me that the pain she is having couldn't possibly be anything related to withdrawal. She claims she last had some heroin about a week ago. She denied that she was using any other drugs at all. Denied any alcohol use. Told me at one point that she had a prescription for Xanax but later did not mention being on any benzodiazepines. Information from the chart shows that her urine drug screen process today is positive for opiates amphetamines and cocaine and benzodiazepines. Looking at her controlled substance database she has not been receiving any prescriptions for benzodiazepines recently. She had been on Suboxone maintenance treatment but the last prescription was written in May.  Social history: Patient says she lives with an uncle. Not currently working. Says that she feels like she has a safe living situation.  Medical history: Patient has a history of a presentation was similar symptoms and findings in the past but at that time she walked out [place]. She says that she healed up from that and didn't have any further complications. I see that she has had visits with obstetricians and has had children delivered in the not so distant past. No other ongoing active medical problems.  Substance abuse history: Long history of opiate abuse. She was not very cooperative with giving me details of it. Was in Suboxone treatment during her pregnancy but apparently has not continue to follow-up with that. Opiates appear to be  drug of choice. Unclear how much sobriety she has had.  Past Psychiatric History: Patient told me she thinks that she might of had psychiatric treatment or even hospitalization in the distant past but can't remember what for. We can't find any documentation in our records of other psychiatric  treatment. Denies ever trying to kill her self in the past.  Risk to Self: Is patient at risk for suicide?: No Risk to Others:   Prior Inpatient Therapy:   Prior Outpatient Therapy:    Past Medical History:  Past Medical History  Diagnosis Date  . Hepatitis C   . Asthma   . Substance abuse   . Anemia   . DJD (degenerative joint disease)     Past Surgical History  Procedure Laterality Date  . Tumor excision      left scalpula  . Inguinal hernia repair Left 04/28/2013    Procedure: HERNIA REPAIR INGUINAL ADULT with mesh;  Surgeon: Gwenyth Ober, MD;  Location: [facility] OR;  Service: General;  Laterality: Left;   Family History:  Family History  Problem Relation Age of Onset  . Cancer Mother   . Cancer - Colon Maternal Grandfather   . Diabetes Maternal Grandfather    Family Psychiatric  History: Patient denies any family history of mental health or substance abuse problems Social History:  History  Alcohol Use No     History  Drug Use  . Yes    Comment: cocaine,Heroin    Social History   Social History  . Marital Status: Single    Spouse Name: N/A  . Number of Children: N/A  . Years of Education: N/A   Occupational History  . unemployed    Social History Main Topics  . Smoking status: Current Every Day Smoker    Types: Cigarettes  . Smokeless tobacco: None  . Alcohol Use: No  . Drug Use: Yes     Comment: cocaine,Heroin  . Sexual Activity: Yes    Birth Control/ Protection: None   Other Topics Concern  . None   Social History Narrative   Additional Social History:    Allergies:  No Known Allergies  Labs:  Results for orders placed or performed during the hospital encounter of 11/13/15 (from the past 48 hour(s))  Basic metabolic panel     Status: Abnormal   Collection Time: 11/13/15  1:00 AM  Result Value Ref Range   Sodium 129 (L) 135 - 145 mmol/L   Potassium 2.9 (L) 3.5 - 5.1 mmol/L   Chloride 96 (L) 101 - 111 mmol/L   CO2 23 22 - 32 mmol/L    Glucose, Bld 156 (H) 65 - 99 mg/dL   BUN 6 6 - 20 mg/dL   Creatinine, Ser 0.67 0.44 - 1.00 mg/dL   Calcium 8.5 (L) 8.9 - 10.3 mg/dL   GFR calc non Af Amer >60 >60 mL/min   GFR calc Af Amer >60 >60 mL/min    Comment: (NOTE) The eGFR has been calculated using the CKD EPI equation. This calculation has not been validated in all clinical situations. eGFR's persistently <60 mL/min signify possible Chronic Kidney Disease.    Anion gap 10 5 - 15  CBC     Status: Abnormal   Collection Time: 11/13/15  1:00 AM  Result Value Ref Range   WBC 27.0 (H) 3.6 - 11.0 K/uL   RBC 4.46 3.80 - 5.20 MIL/uL   Hemoglobin 12.0 12.0 - 16.0 g/dL   HCT 35.1 35.0 - 47.0 %  MCV 78.6 (L) 80.0 - 100.0 fL   MCH 26.9 26.0 - 34.0 pg   MCHC 34.2 32.0 - 36.0 g/dL   RDW 71.0 (H) 62.6 - 94.8 %   Platelets 285 150 - 440 K/uL  Troponin I     Status: None   Collection Time: 11/13/15  1:00 AM  Result Value Ref Range   Troponin I <0.03 <0.03 ng/mL  Lactic acid, plasma     Status: None   Collection Time: 11/13/15  3:33 AM  Result Value Ref Range   Lactic Acid, Venous 1.6 0.5 - 1.9 mmol/L  Urinalysis complete, with microscopic ([facility] only)     Status: Abnormal   Collection Time: 11/13/15  3:50 AM  Result Value Ref Range   Color, Urine YELLOW (A) YELLOW   APPearance CLEAR (A) CLEAR   Glucose, UA NEGATIVE NEGATIVE mg/dL   Bilirubin Urine NEGATIVE NEGATIVE   Ketones, ur NEGATIVE NEGATIVE mg/dL   Specific Gravity, Urine 1.020 1.005 - 1.030   Hgb urine dipstick 1+ (A) NEGATIVE   pH 5.0 5.0 - 8.0   Protein, ur 100 (A) NEGATIVE mg/dL   Nitrite NEGATIVE NEGATIVE   Leukocytes, UA NEGATIVE NEGATIVE   RBC / HPF 0-5 0 - 5 RBC/hpf   WBC, UA 0-5 0 - 5 WBC/hpf   Bacteria, UA RARE (A) NONE SEEN   Squamous Epithelial / LPF 0-5 (A) NONE SEEN   Mucous PRESENT   Pregnancy, urine     Status: None   Collection Time: 11/13/15  3:50 AM  Result Value Ref Range   Preg Test, Ur NEGATIVE NEGATIVE  Urine Drug Screen, Qualitative ([facility]  only)     Status: Abnormal   Collection Time: 11/13/15  3:50 AM  Result Value Ref Range   Tricyclic, Ur Screen NONE DETECTED NONE DETECTED   Amphetamines, Ur Screen POSITIVE (A) NONE DETECTED   MDMA (Ecstasy)Ur Screen NONE DETECTED NONE DETECTED   Cocaine Metabolite,Ur [place] POSITIVE (A) NONE DETECTED   Opiate, Ur Screen POSITIVE (A) NONE DETECTED   Phencyclidine (PCP) Ur S NONE DETECTED NONE DETECTED   Cannabinoid 50 Ng, Ur [place] NONE DETECTED NONE DETECTED   Barbiturates, Ur Screen NONE DETECTED NONE DETECTED   Benzodiazepine, Ur Scrn POSITIVE (A) NONE DETECTED   Methadone Scn, Ur NONE DETECTED NONE DETECTED    Comment: (NOTE) 100  Tricyclics, urine               Cutoff 1000 ng/mL 200  Amphetamines, urine             Cutoff 1000 ng/mL 300  MDMA (Ecstasy), urine           Cutoff 500 ng/mL 400  Cocaine Metabolite, urine       Cutoff 300 ng/mL 500  Opiate, urine                   Cutoff 300 ng/mL 600  Phencyclidine (PCP), urine      Cutoff 25 ng/mL 700  Cannabinoid, urine              Cutoff 50 ng/mL 800  Barbiturates, urine             Cutoff 200 ng/mL 900  Benzodiazepine, urine           Cutoff 200 ng/mL 1000 Methadone, urine                Cutoff 300 ng/mL 1100 1200 The urine drug screen provides only a preliminary, unconfirmed 1300 analytical  test result and should not be used for non-medical 1400 purposes. Clinical consideration and professional judgment should 1500 be applied to any positive drug screen result due to possible 1600 interfering substances. A more specific alternate chemical method 1700 must be used in order to obtain a confirmed analytical result.  1800 Gas chromato graphy / mass spectrometry (GC/MS) is the preferred 1900 confirmatory method.     Current Facility-Administered Medications  Medication Dose Route Frequency Provider Last Rate Last Dose  . 0.9 %  sodium chloride infusion   Intravenous Continuous Saundra Shelling, MD 125 mL/hr at 11/13/15 0759    .  acetaminophen (TYLENOL) tablet 650 mg  650 mg Oral Q6H PRN Saundra Shelling, MD   650 mg at 11/13/15 1129   Or  . acetaminophen (TYLENOL) suppository 650 mg  650 mg Rectal Q6H PRN Saundra Shelling, MD      . docusate sodium (COLACE) capsule 100 mg  100 mg Oral BID Saundra Shelling, MD   100 mg at 11/13/15 0950  . enoxaparin (LOVENOX) injection 40 mg  40 mg Subcutaneous Q24H Saundra Shelling, MD   40 mg at 11/13/15 1130  . lidocaine (LIDODERM) 5 % 1 patch  1 patch Transdermal Once Nance Pear, MD   1 patch at 11/13/15 0530  . [START ON 11/14/2015] methadone (DOLOPHINE) 0.4 mg/mL oral solution 30 mg  30 mg Oral Daily Gonzella Lex, MD      . methadone (DOLOPHINE) 0.4 mg/mL oral solution 30 mg  30 mg Oral STAT Gonzella Lex, MD      . multivitamin with minerals tablet 1 tablet  1 tablet Oral Daily Saundra Shelling, MD   1 tablet at 11/13/15 0951  . ondansetron (ZOFRAN) tablet 4 mg  4 mg Oral Q6H PRN Saundra Shelling, MD       Or  . ondansetron (ZOFRAN) injection 4 mg  4 mg Intravenous Q6H PRN Pavan Pyreddy, MD      . piperacillin-tazobactam (ZOSYN) IVPB 3.375 g  3.375 g Intravenous Q8H Nance Pear, MD      . potassium chloride 10 mEq in 100 mL IVPB  10 mEq Intravenous Q1 Hr x 4 Pavan Pyreddy, MD   10 mEq at 11/13/15 1107  . sodium chloride flush (NS) 0.9 % injection 3 mL  3 mL Intravenous Q12H Saundra Shelling, MD   3 mL at 11/13/15 0952  . vancomycin (VANCOCIN) IVPB 750 mg/150 ml premix  750 mg Intravenous Q12H Nance Pear, MD        Musculoskeletal: Strength & Muscle Tone: within normal limits Gait & Station: unable to stand Patient leans: N/A  Psychiatric Specialty Exam: Physical Exam  Nursing note and vitals reviewed. Constitutional: She appears well-developed and well-nourished.  HENT:  Head: Normocephalic and atraumatic.  Eyes: Conjunctivae are normal. Pupils are equal, round, and reactive to light.  Neck: Normal range of motion.  Cardiovascular: Regular rhythm and normal heart sounds.    Respiratory: Effort normal.  GI: Soft.  Musculoskeletal: Normal range of motion.  Neurological: She is alert.  Skin: Skin is warm and dry.  Psychiatric: Her speech is normal. Her mood appears anxious. Her affect is labile. She is agitated. Cognition and memory are normal. She expresses impulsivity. She expresses no suicidal ideation.    Review of Systems  Constitutional: Negative.   HENT: Negative.   Eyes: Negative.   Respiratory: Negative.   Cardiovascular: Negative.   Gastrointestinal: Negative.  Negative for nausea and vomiting.  Musculoskeletal: Positive for myalgias, back pain and  joint pain.  Skin: Negative.   Neurological: Negative.   Psychiatric/Behavioral: Positive for depression and substance abuse. Negative for suicidal ideas, hallucinations and memory loss. The patient is nervous/anxious and has insomnia.     Blood pressure 104/72, pulse 101, temperature 98.1 F (36.7 C), temperature source Oral, resp. rate 19, height _0  (1.575 m), weight 54.432 kg (120 lb), last menstrual period 11/13/2015, SpO2 100 %.Body mass index is 21.94 kg/(m^2).  General Appearance: Disheveled  Eye Contact:  Minimal  Speech:  Clear and Coherent  Volume:  Decreased  Mood:  Anxious, Depressed and Irritable  Affect:  Labile  Thought Process:  Goal Directed  Orientation:  Full (Time, Place, and Person)  Thought Content:  Focus constantly on complaining about her pain. No evidence of delusional or psychotic thinking  Suicidal Thoughts:  No  Homicidal Thoughts:  No  Memory:  Immediate;   Good Recent;   Fair Remote;   Poor  Judgement:  Impaired  Insight:  Shallow  Psychomotor Activity:  Restlessness  Concentration:  Concentration: Poor  Recall:  Poor  Fund of Knowledge:  Fair  Language:  Fair  Akathisia:  No  Handed:  Right  AIMS (if indicated):     Assets:  Communication Skills Desire for Improvement  ADL's:  Impaired  Cognition:  WNL  Sleep:        Treatment Plan  Summary: Daily contact with patient to assess and evaluate symptoms and progress in treatment, Medication management and Plan 40 year old woman with a history of opiate dependence. Not entirely honest with me about recent drug abuse as evidenced by the drug screen. Patient is complaining of severe pain. Although I don't want to minimize her suffering eye and suspicious as to whether she is having as much pain as she indicates. There was a lot of variability in how distressed she looked depending on whether or not I was in the room. Her chest CT does not show any clear lesions of the bones in her back or chest. I suspect that a lot of her discomfort is opiate withdrawal . By law it is acceptable to use either methadone or Subutex for 72 hours in a hospital setting for detox in a patient with opiate abuse when the goal is to complete withdrawal from opiates. Patient states that she would much prefer methadone. I will order 30 mg of oral methadone liquid once today and then 30 mg per day the next 2 days. We will follow up closely to see how she's doing. I would recommend minimizing or completely avoiding any other narcotic pain medicine and less there is clear evidence of a focal source of severe pain. Otherwise recommend management with nonsteroidal or other nonnarcotic agents. No indication for other psychiatric treatment. Patient was apprised of the plan and agrees.  Disposition: Patient does not meet criteria for psychiatric inpatient admission. Supportive therapy provided about ongoing stressors.  Alethia Berthold, MD 11/13/2015 12:04 PM

## 2015-11-13 NOTE — Consult Note (Signed)
[facility] Infectious Disease     Reason for Consult: PNA  Referring Physician: Boyce Medici Date of Admission:  11/13/2015   Principal Problem:   Sepsis [facility]) Active Problems:   Community acquired pneumonia   Pneumonia   Substance induced mood disorder ([place])   Opiate withdrawal ([place])   Opiate abuse, continuous   HPI: Andrea King is a 40 y.o. female with hx of history of Hepatitis C and active IV  substance abuse admitted with sob and cp as well as fever and leukocytosis. She is currently complaining of intense pain all over her body and is complaining that no one will give her pain meds and she only received 30 mg of methadone today.   Past Medical History  Diagnosis Date  . Hepatitis C   . Asthma   . Substance abuse   . Anemia   . DJD (degenerative joint disease)    Past Surgical History  Procedure Laterality Date  . Tumor excision      left scalpula  . Inguinal hernia repair Left 04/28/2013    Procedure: HERNIA REPAIR INGUINAL ADULT with mesh;  Surgeon: Gwenyth Ober, MD;  Location: [place];  Service: General;  Laterality: Left;   Social History  Substance Use Topics  . Smoking status: Current Every Day Smoker    Types: Cigarettes  . Smokeless tobacco: None  . Alcohol Use: No   Family History  Problem Relation Age of Onset  . Cancer Mother   . Cancer - Colon Maternal Grandfather   . Diabetes Maternal Grandfather     Allergies: No Known Allergies  Current antibiotics: Antibiotics Given (last 72 hours)    Date/Time Action Medication Dose Rate   11/13/15 1431 Given   piperacillin-tazobactam (ZOSYN) IVPB 3.375 g 3.375 g 12.5 mL/hr   11/13/15 1733 Given   vancomycin (VANCOCIN) IVPB 750 mg/150 ml premix 750 mg 150 mL/hr   11/13/15 1918 Given   piperacillin-tazobactam (ZOSYN) IVPB 3.375 g 3.375 g 12.5 mL/hr      MEDICATIONS: . docusate sodium  100 mg Oral BID  . enoxaparin (LOVENOX) injection  40 mg Subcutaneous Q24H  . lidocaine  1 patch Transdermal Q24H   . [START ON 11/14/2015] methadone  30 mg Oral Daily  . multivitamin with minerals  1 tablet Oral Daily  . potassium chloride  40 mEq Oral BID  . sodium chloride flush  3 mL Intravenous Q12H  . vancomycin  750 mg Intravenous Q12H    Review of Systems - 11 systems reviewed and negative per HPI   OBJECTIVE: Temp:  [98.1 F (36.7 C)-103.1 F (39.5 C)] 103.1 F (39.5 C) (07/12 1916) Pulse Rate:  [85-136] 136 (07/12 1916) Resp:  [18-26] 19 (07/12 1557) BP: (101-138)/(51-72) 125/63 mmHg (07/12 1916) SpO2:  [96 %-100 %] 96 % (07/12 1916) Weight:  [54.432 kg (120 lb)] 54.432 kg (120 lb) (07/12 0025) Physical Exam  Constitutional:  oriented to person, place, and time. Thin, dishelved, agitated HENT: [place]/AT, PERRLA, no scleral icterus Mouth/Throat: Oropharynx is clear and dry, poor dentition. No thrush Cardiovascular: tachy reg Pulmonary/Chest: bil rhonchi Neck = supple, no nuchal rigidity Abdominal: Soft. Bowel sounds are normal.  exhibits no distension. There is no tenderness.  Lymphadenopathy: no cervical adenopathy. No axillary adenopathy Neurological: alert and oriented to person, place, and time.  Skin: several scabs on LE, has a small pimple like lesion to L of T spine area- tender Psychiatric: agitated LABS: Results for orders placed or performed during the hospital encounter of  11/13/15 (from the past 48 hour(s))  Rapid HIV screen (HIV 1/2 Ab+Ag)     Status: None   Collection Time: 11/13/15 12:14 AM  Result Value Ref Range   HIV-1 P24 Antigen - HIV24 NON REACTIVE NON REACTIVE   HIV 1/2 Antibodies NON REACTIVE NON REACTIVE   Interpretation (HIV Ag Ab)      A non reactive test result means that HIV 1 or HIV 2 antibodies and HIV 1 p24 antigen were not detected in the specimen.  Basic metabolic panel     Status: Abnormal   Collection Time: 11/13/15  1:00 AM  Result Value Ref Range   Sodium 129 (L) 135 - 145 mmol/L   Potassium 2.9 (L) 3.5 - 5.1 mmol/L   Chloride 96 (L) 101 -  111 mmol/L   CO2 23 22 - 32 mmol/L   Glucose, Bld 156 (H) 65 - 99 mg/dL   BUN 6 6 - 20 mg/dL   Creatinine, Ser 7.84 0.44 - 1.00 mg/dL   Calcium 8.5 (L) 8.9 - 10.3 mg/dL   GFR calc non Af Amer >60 >60 mL/min   GFR calc Af Amer >60 >60 mL/min    Comment: (NOTE) The eGFR has been calculated using the CKD EPI equation. This calculation has not been validated in all clinical situations. eGFR's persistently <60 mL/min signify possible Chronic Kidney Disease.    Anion gap 10 5 - 15  CBC     Status: Abnormal   Collection Time: 11/13/15  1:00 AM  Result Value Ref Range   WBC 27.0 (H) 3.6 - 11.0 K/uL   RBC 4.46 3.80 - 5.20 MIL/uL   Hemoglobin 12.0 12.0 - 16.0 g/dL   HCT 78.4 12.8 - 20.8 %   MCV 78.6 (L) 80.0 - 100.0 fL   MCH 26.9 26.0 - 34.0 pg   MCHC 34.2 32.0 - 36.0 g/dL   RDW 13.8 (H) 87.1 - 95.9 %   Platelets 285 150 - 440 K/uL  Troponin I     Status: None   Collection Time: 11/13/15  1:00 AM  Result Value Ref Range   Troponin I <0.03 <0.03 ng/mL  Blood Culture (routine x 2)     Status: None (Preliminary result)   Collection Time: 11/13/15  3:30 AM  Result Value Ref Range   Specimen Description BLOOD RIGHT HAND    Special Requests      BOTTLES DRAWN AEROBIC AND ANAEROBIC  6CCAERO, 5CCANA   Culture  Setup Time      GRAM POSITIVE COCCI IN BOTH AEROBIC AND ANAEROBIC BOTTLES IDENTIFICATION TO FOLLOW    Culture GRAM POSITIVE COCCI    Report Status PENDING   Blood Culture (routine x 2)     Status: None (Preliminary result)   Collection Time: 11/13/15  3:30 AM  Result Value Ref Range   Specimen Description BLOOD RIGHT ASSIST CONTROL    Special Requests      BOTTLES DRAWN AEROBIC AND ANAEROBIC  9CCAERO,6CCANA   Culture  Setup Time      GRAM POSITIVE COCCI IN BOTH AEROBIC AND ANAEROBIC BOTTLES CRITICAL RESULT CALLED TO, READ BACK BY AND VERIFIED WITH: JASON ROBBINS AT 1757 11/13/15 SDR Organism ID to follow    Culture GRAM POSITIVE COCCI    Report Status PENDING   Blood  Culture ID Panel (Reflexed)     Status: Abnormal   Collection Time: 11/13/15  3:30 AM  Result Value Ref Range   Enterococcus species NOT DETECTED NOT DETECTED   Vancomycin  resistance NOT DETECTED NOT DETECTED   Listeria monocytogenes NOT DETECTED NOT DETECTED   Staphylococcus species NOT DETECTED NOT DETECTED   Staphylococcus aureus DETECTED (A) NOT DETECTED    Comment: CRITICAL RESULT CALLED TO, READ BACK BY AND VERIFIED WITH: JASON ROBBINS AT 1757 11/13/15 SDR    Methicillin resistance DETECTED (A) NOT DETECTED    Comment: CRITICAL RESULT CALLED TO, READ BACK BY AND VERIFIED WITH: JASON ROBBINS AT 1757 11/13/15 SDR    Streptococcus species NOT DETECTED NOT DETECTED   Streptococcus agalactiae NOT DETECTED NOT DETECTED   Streptococcus pneumoniae NOT DETECTED NOT DETECTED   Streptococcus pyogenes NOT DETECTED NOT DETECTED   Acinetobacter baumannii NOT DETECTED NOT DETECTED   Enterobacteriaceae species NOT DETECTED NOT DETECTED   Enterobacter cloacae complex NOT DETECTED NOT DETECTED   Escherichia coli NOT DETECTED NOT DETECTED   Klebsiella oxytoca NOT DETECTED NOT DETECTED   Klebsiella pneumoniae NOT DETECTED NOT DETECTED   Proteus species NOT DETECTED NOT DETECTED   Serratia marcescens NOT DETECTED NOT DETECTED   Carbapenem resistance NOT DETECTED NOT DETECTED   Haemophilus influenzae NOT DETECTED NOT DETECTED   Neisseria meningitidis NOT DETECTED NOT DETECTED   Pseudomonas aeruginosa NOT DETECTED NOT DETECTED   Candida albicans NOT DETECTED NOT DETECTED   Candida glabrata NOT DETECTED NOT DETECTED   Candida krusei NOT DETECTED NOT DETECTED   Candida parapsilosis NOT DETECTED NOT DETECTED   Candida tropicalis NOT DETECTED NOT DETECTED  Lactic acid, plasma     Status: None   Collection Time: 11/13/15  3:33 AM  Result Value Ref Range   Lactic Acid, Venous 1.6 0.5 - 1.9 mmol/L  Urinalysis complete, with microscopic ([facility] only)     Status: Abnormal   Collection Time: 11/13/15  3:50  AM  Result Value Ref Range   Color, Urine YELLOW (A) YELLOW   APPearance CLEAR (A) CLEAR   Glucose, UA NEGATIVE NEGATIVE mg/dL   Bilirubin Urine NEGATIVE NEGATIVE   Ketones, ur NEGATIVE NEGATIVE mg/dL   Specific Gravity, Urine 1.020 1.005 - 1.030   Hgb urine dipstick 1+ (A) NEGATIVE   pH 5.0 5.0 - 8.0   Protein, ur 100 (A) NEGATIVE mg/dL   Nitrite NEGATIVE NEGATIVE   Leukocytes, UA NEGATIVE NEGATIVE   RBC / HPF 0-5 0 - 5 RBC/hpf   WBC, UA 0-5 0 - 5 WBC/hpf   Bacteria, UA RARE (A) NONE SEEN   Squamous Epithelial / LPF 0-5 (A) NONE SEEN   Mucous PRESENT   Pregnancy, urine     Status: None   Collection Time: 11/13/15  3:50 AM  Result Value Ref Range   Preg Test, Ur NEGATIVE NEGATIVE  Urine Drug Screen, Qualitative ([facility] only)     Status: Abnormal   Collection Time: 11/13/15  3:50 AM  Result Value Ref Range   Tricyclic, Ur Screen NONE DETECTED NONE DETECTED   Amphetamines, Ur Screen POSITIVE (A) NONE DETECTED   MDMA (Ecstasy)Ur Screen NONE DETECTED NONE DETECTED   Cocaine Metabolite,Ur [place] POSITIVE (A) NONE DETECTED   Opiate, Ur Screen POSITIVE (A) NONE DETECTED   Phencyclidine (PCP) Ur S NONE DETECTED NONE DETECTED   Cannabinoid 50 Ng, Ur [place] NONE DETECTED NONE DETECTED   Barbiturates, Ur Screen NONE DETECTED NONE DETECTED   Benzodiazepine, Ur Scrn POSITIVE (A) NONE DETECTED   Methadone Scn, Ur NONE DETECTED NONE DETECTED    Comment: (NOTE) 476  Tricyclics, urine               Cutoff 1000 ng/mL  200  Amphetamines, urine             Cutoff 1000 ng/mL 300  MDMA (Ecstasy), urine           Cutoff 500 ng/mL 400  Cocaine Metabolite, urine       Cutoff 300 ng/mL 500  Opiate, urine                   Cutoff 300 ng/mL 600  Phencyclidine (PCP), urine      Cutoff 25 ng/mL 700  Cannabinoid, urine              Cutoff 50 ng/mL 800  Barbiturates, urine             Cutoff 200 ng/mL 900  Benzodiazepine, urine           Cutoff 200 ng/mL 1000 Methadone, urine                Cutoff 300  ng/mL 1100 1200 The urine drug screen provides only a preliminary, unconfirmed 1300 analytical test result and should not be used for non-medical 1400 purposes. Clinical consideration and professional judgment should 1500 be applied to any positive drug screen result due to possible 1600 interfering substances. A more specific alternate chemical method 1700 must be used in order to obtain a confirmed analytical result.  1800 Gas chromato graphy / mass spectrometry (GC/MS) is the preferred 1900 confirmatory method.   Lactic acid, plasma     Status: Abnormal   Collection Time: 11/13/15  1:41 PM  Result Value Ref Range   Lactic Acid, Venous 2.2 (HH) 0.5 - 1.9 mmol/L    Comment: CRITICAL RESULT CALLED TO, READ BACK BY AND VERIFIED WITH TIFFANY COOPER 11/13/15 1453 SGD   Procalcitonin     Status: None   Collection Time: 11/13/15  1:41 PM  Result Value Ref Range   Procalcitonin 0.55 ng/mL    Comment:        Interpretation: PCT > 0.5 ng/mL and <= 2 ng/mL: Systemic infection (sepsis) is possible, but other conditions are known to elevate PCT as well. (NOTE)         ICU PCT Algorithm               Non ICU PCT Algorithm    ----------------------------     ------------------------------         PCT < 0.25 ng/mL                 PCT < 0.1 ng/mL     Stopping of antibiotics            Stopping of antibiotics       strongly encouraged.               strongly encouraged.    ----------------------------     ------------------------------       PCT level decrease by               PCT < 0.25 ng/mL       >= 80% from peak PCT       OR PCT 0.25 - 0.5 ng/mL          Stopping of antibiotics                                             encouraged.     Stopping of  antibiotics           encouraged.    ----------------------------     ------------------------------       PCT level decrease by              PCT >= 0.25 ng/mL       < 80% from peak PCT        AND PCT >= 0.5 ng/mL             Continuing  antibiotics                                              encouraged.       Continuing antibiotics            encouraged.    ----------------------------     ------------------------------     PCT level increase compared          PCT > 0.5 ng/mL         with peak PCT AND          PCT >= 0.5 ng/mL             Escalation of antibiotics                                          strongly encouraged.      Escalation of antibiotics        strongly encouraged.   Magnesium     Status: None   Collection Time: 11/13/15  1:41 PM  Result Value Ref Range   Magnesium 2.0 1.7 - 2.4 mg/dL   No components found for: ESR, C REACTIVE PROTEIN MICRO: Recent Results (from the past 720 hour(s))  Blood Culture (routine x 2)     Status: None (Preliminary result)   Collection Time: 11/13/15  3:30 AM  Result Value Ref Range Status   Specimen Description BLOOD RIGHT HAND  Final   Special Requests   Final    BOTTLES DRAWN AEROBIC AND ANAEROBIC  6CCAERO, 5CCANA   Culture  Setup Time   Final    GRAM POSITIVE COCCI IN BOTH AEROBIC AND ANAEROBIC BOTTLES IDENTIFICATION TO FOLLOW    Culture GRAM POSITIVE COCCI  Final   Report Status PENDING  Incomplete  Blood Culture (routine x 2)     Status: None (Preliminary result)   Collection Time: 11/13/15  3:30 AM  Result Value Ref Range Status   Specimen Description BLOOD RIGHT ASSIST CONTROL  Final   Special Requests   Final    BOTTLES DRAWN AEROBIC AND ANAEROBIC  9CCAERO,6CCANA   Culture  Setup Time   Final    GRAM POSITIVE COCCI IN BOTH AEROBIC AND ANAEROBIC BOTTLES CRITICAL RESULT CALLED TO, READ BACK BY AND VERIFIED WITH: JASON ROBBINS AT 1757 11/13/15 SDR Organism ID to follow    Culture GRAM POSITIVE COCCI  Final   Report Status PENDING  Incomplete  Blood Culture ID Panel (Reflexed)     Status: Abnormal   Collection Time: 11/13/15  3:30 AM  Result Value Ref Range Status   Enterococcus species NOT DETECTED NOT DETECTED Final   Vancomycin resistance NOT  DETECTED NOT DETECTED Final   Listeria monocytogenes NOT DETECTED NOT DETECTED Final   Staphylococcus species NOT DETECTED NOT DETECTED Final  Staphylococcus aureus DETECTED (A) NOT DETECTED Final    Comment: CRITICAL RESULT CALLED TO, READ BACK BY AND VERIFIED WITH: JASON ROBBINS AT 2035 11/13/15 SDR    Methicillin resistance DETECTED (A) NOT DETECTED Final    Comment: CRITICAL RESULT CALLED TO, READ BACK BY AND VERIFIED WITH: JASON ROBBINS AT 5974 11/13/15 SDR    Streptococcus species NOT DETECTED NOT DETECTED Final   Streptococcus agalactiae NOT DETECTED NOT DETECTED Final   Streptococcus pneumoniae NOT DETECTED NOT DETECTED Final   Streptococcus pyogenes NOT DETECTED NOT DETECTED Final   Acinetobacter baumannii NOT DETECTED NOT DETECTED Final   Enterobacteriaceae species NOT DETECTED NOT DETECTED Final   Enterobacter cloacae complex NOT DETECTED NOT DETECTED Final   Escherichia coli NOT DETECTED NOT DETECTED Final   Klebsiella oxytoca NOT DETECTED NOT DETECTED Final   Klebsiella pneumoniae NOT DETECTED NOT DETECTED Final   Proteus species NOT DETECTED NOT DETECTED Final   Serratia marcescens NOT DETECTED NOT DETECTED Final   Carbapenem resistance NOT DETECTED NOT DETECTED Final   Haemophilus influenzae NOT DETECTED NOT DETECTED Final   Neisseria meningitidis NOT DETECTED NOT DETECTED Final   Pseudomonas aeruginosa NOT DETECTED NOT DETECTED Final   Candida albicans NOT DETECTED NOT DETECTED Final   Candida glabrata NOT DETECTED NOT DETECTED Final   Candida krusei NOT DETECTED NOT DETECTED Final   Candida parapsilosis NOT DETECTED NOT DETECTED Final   Candida tropicalis NOT DETECTED NOT DETECTED Final    IMAGING: Dg Chest 2 View  11/13/2015  CLINICAL DATA:  Chest pain, chronic back pain, and shortness of breath since yesterday. Current smoker. EXAM: CHEST  2 VIEW COMPARISON:  06/29/2014 FINDINGS: New pulmonary nodules are demonstrated in the upper lungs bilaterally, right nodule  measuring 15 mm, left apical nodular measuring 9 mm, left mid lung nodule measuring 13 mm, and left upper lung nodule measuring 13 mm. Findings are suspicious for metastatic disease. Previously demonstrated right mid lung nodule is not appreciated today. Emphysematous changes in the lungs with interstitial pattern suggesting fibrosis. No focal consolidation. Normal heart size and pulmonary vascularity. No blunting of costophrenic angles. No pneumothorax. IMPRESSION: Multiple bilateral pulmonary nodules appear new since previous study, possibly metastatic. Electronically Signed   By: Lucienne Capers M.D.   On: 11/13/2015 01:30   Ct Chest Wo Contrast  11/13/2015  CLINICAL DATA:  Chest pain, chronic back pain, and shortness of breath since yesterday. Current smoker. EXAM: CT CHEST WITHOUT CONTRAST TECHNIQUE: Multidetector CT imaging of the chest was performed following the standard protocol without IV contrast. COMPARISON:  06/27/2014 FINDINGS: Cardiovascular: Normal heart size.  Normal caliber thoracic aorta. Mediastinum/Nodes: No significant lymphadenopathy in the chest. Esophagus is decompressed. Lungs/Pleura: Multiple pulmonary nodules demonstrated throughout both lungs, many with cavitation. Margins are indistinct and some nodules demonstrate ground-glass appearance. Nodules are new since previous study. Appearance is favored to represent infectious or inflammatory process such as multifocal pneumonia, fungal disease, septic emboli, TB, or Wegener's disease. No pleural effusions. No pneumothorax. Upper Abdomen: Visualized upper abdominal organs are unremarkable. Musculoskeletal: No destructive bone lesions. IMPRESSION: Multifocal nodular infiltration throughout both lungs, many with cavitation. Significant progression since previous study. Favor infectious or inflammatory process as described above. Follow-up after treatment is recommended to exclude underlying neoplastic disease. Electronically Signed   By:  Lucienne Capers M.D.   On: 11/13/2015 06:10    Assessment:   FLOYD LUSIGNAN is a 40 y.o. female with hx IVDU admitted with fevers and chest pain and found to have septic emboli  in lungs. BCX + MRSA. No obvious skin infection or abscess but source is likely injection drug use.  She has some back pain and a small boil over L T spine but diff to tell if her pain at this site is just limited to the skin lesion or could be deeper to bone since she is complaining of pain all over and is actively withdrawing.   Recommendations Check HIV Repeat BCX Vancomycin - for goal trough 15-20 Can dc zosyn  - ordered Echo is pending but even if no vegetation she will need treatment for presumed endocarditis given the septic emboli.  Given this I do not see a need for a TEE unless she develops hemodynamic instability and valvular disruption.  It will be difficult to arrange treatment given her IVDU - cannot discharge her with a picc line. Will need to work with CM to arrange placement for the course of IV therapy. Place picc after fu bcx are negative.  Monitor for metastatic sites of infection - currently her back pain may be due to just the pimple on skin or to discitis. Once she settles down if still very painful would do MRI.  Thank you very much for allowing me to participate in the care of this patient.  I will be unavailable after tomorrow but will sign the patient out to the on call ID doctor at [facility] who will be available for phone consultation Cheral Marker. Ola Spurr, MD

## 2015-11-13 NOTE — H&P (Signed)
[facility] [place] at [facility]   PATIENT NAME: Andrea King    MR#:  [id number]  DATE OF BIRTH:  Apr 09, 1976  DATE OF ADMISSION:  11/13/2015  PRIMARY CARE PHYSICIAN: No PCP Per Patient   REQUESTING/REFERRING PHYSICIAN:   CHIEF COMPLAINT:   Chief Complaint  Patient presents with  . Chest Pain    HISTORY OF PRESENT ILLNESS: Andrea King  is a 40 y.o. female with a known history of Hepatitis C, bronchial asthma, substance abuse, anemia presented to the emergency room with chest discomfort and difficulty breathing. Patient had shortness of breath for the last few days and also had some chest discomfort on deep breathing. During the presentation the emergency room she also had fever. Patient is not a great historian she was lethargic and sleepy. Workup in the emergency room showed multiple pulmonary nodules on chest x-ray and CT scan of the chest was ordered. Not much history could be obtained from the patient as she was not wandering information. Old records were reviewed from [facility] which revealed patient is an active drug abuser. Patient abuses heroin and cocaine and has needle tracks over the lower extremities. We do not know whether patient has any history of endocarditis in the past. WBC count was 27,000 on the laboratory workup patient was started on a IV antibiotics broad spectrum and fluids were started on sepsis protocol in the emergency room. Hospitalist service was consulted for further care of the patient.  PAST MEDICAL HISTORY:   Past Medical History  Diagnosis Date  . Hepatitis C   . Asthma   . Substance abuse   . Anemia   . DJD (degenerative joint disease)     PAST SURGICAL HISTORY: Past Surgical History  Procedure Laterality Date  . Tumor excision      left scalpula  . Inguinal hernia repair Left 04/28/2013    Procedure: HERNIA REPAIR INGUINAL ADULT with mesh;  Surgeon: Cherylynn Ridges, MD;  Location: [facility] OR;  Service: General;   Laterality: Left;    SOCIAL HISTORY:  Social History  Substance Use Topics  . Smoking status: Current Every Day Smoker    Types: Cigarettes  . Smokeless tobacco: Not on file  . Alcohol Use: No    FAMILY HISTORY:  Family History  Problem Relation Age of Onset  . Cancer Mother   . Cancer - Colon Maternal Grandfather   . Diabetes Maternal Grandfather     DRUG ALLERGIES: No Known Allergies  REVIEW OF SYSTEMS:  Could not be obtained as patient is lethargic, sleepy and confused.  MEDICATIONS AT HOME:  Prior to Admission medications   Medication Sig Start Date End Date Taking? Authorizing Provider  acetaminophen (TYLENOL) 500 MG tablet Take 1,000 mg by mouth every 6 (six) hours as needed for moderate pain.   Yes Historical Provider, MD  ibuprofen (ADVIL,MOTRIN) 200 MG tablet Take 600 mg by mouth every 6 (six) hours as needed for moderate pain.   Yes Historical Provider, MD      PHYSICAL EXAMINATION:   VITAL SIGNS: Blood pressure 113/65, pulse 115, temperature 100.3 F (37.9 C), temperature source Oral, resp. rate 20, height 5\' 2"  (1.575 m), weight 54.432 kg (120 lb), last menstrual period 11/13/2015, SpO2 97 %.  GENERAL:  40 y.o.-year-old patient lying in the bed in mild distress.  EYES: Pupils equal, round, reactive to light and accommodation. No scleral icterus. Extraocular muscles intact.  HEENT: Head atraumatic, normocephalic. Oropharynx dry and nasopharynx clear.  NECK:  Supple, no jugular venous distention. No thyroid enlargement, no tenderness.  LUNGS: Decreased breath sounds bilaterally, bilateral rales heard in both lungs. No use of accessory muscles of respiration.  CARDIOVASCULAR: S1, S2 normal. No murmurs, rubs, or gallops.  ABDOMEN: Soft, nontender, nondistended. Bowel sounds present. No organomegaly or mass.  EXTREMITIES: No pedal edema, cyanosis, or clubbing.  NEUROLOGIC: Arousable to painful stimuli and loud verbal commands. Moves all extremities. PSYCHIATRIC:  could not be assessed. SKIN: No obvious rash, lesion, or ulcer.   LABORATORY PANEL:   CBC  Recent Labs Lab 11/13/15 0100  WBC 27.0*  HGB 12.0  HCT 35.1  PLT 285  MCV 78.6*  MCH 26.9  MCHC 34.2  RDW 16.0*   ------------------------------------------------------------------------------------------------------------------  Chemistries   Recent Labs Lab 11/13/15 0100  NA 129*  K 2.9*  CL 96*  CO2 23  GLUCOSE 156*  BUN 6  CREATININE 0.67  CALCIUM 8.5*   ------------------------------------------------------------------------------------------------------------------ estimated creatinine clearance is 73.2 mL/min (by C-G formula based on Cr of 0.67). ------------------------------------------------------------------------------------------------------------------ No results for input(s): TSH, T4TOTAL, T3FREE, THYROIDAB in the last 72 hours.  Invalid input(s): FREET3   Coagulation profile No results for input(s): INR, PROTIME in the last 168 hours. ------------------------------------------------------------------------------------------------------------------- No results for input(s): DDIMER in the last 72 hours. -------------------------------------------------------------------------------------------------------------------  Cardiac Enzymes  Recent Labs Lab 11/13/15 0100  TROPONINI <0.03   ------------------------------------------------------------------------------------------------------------------ Invalid input(s): POCBNP  ---------------------------------------------------------------------------------------------------------------  Urinalysis    Component Value Date/Time   COLORURINE YELLOW* 11/13/2015 0350   APPEARANCEUR CLEAR* 11/13/2015 0350   LABSPEC 1.020 11/13/2015 0350   PHURINE 5.0 11/13/2015 0350   GLUCOSEU NEGATIVE 11/13/2015 0350   HGBUR 1+* 11/13/2015 0350   BILIRUBINUR NEGATIVE 11/13/2015 0350   KETONESUR NEGATIVE 11/13/2015 0350    PROTEINUR 100* 11/13/2015 0350   UROBILINOGEN 0.2 04/27/2013 2330   NITRITE NEGATIVE 11/13/2015 0350   LEUKOCYTESUR NEGATIVE 11/13/2015 0350     RADIOLOGY: Dg Chest 2 View  11/13/2015  CLINICAL DATA:  Chest pain, chronic back pain, and shortness of breath since yesterday. Current smoker. EXAM: CHEST  2 VIEW COMPARISON:  06/29/2014 FINDINGS: New pulmonary nodules are demonstrated in the upper lungs bilaterally, right nodule measuring 15 mm, left apical nodular measuring 9 mm, left mid lung nodule measuring 13 mm, and left upper lung nodule measuring 13 mm. Findings are suspicious for metastatic disease. Previously demonstrated right mid lung nodule is not appreciated today. Emphysematous changes in the lungs with interstitial pattern suggesting fibrosis. No focal consolidation. Normal heart size and pulmonary vascularity. No blunting of costophrenic angles. No pneumothorax. IMPRESSION: Multiple bilateral pulmonary nodules appear new since previous study, possibly metastatic. Electronically Signed   By: Burman Nieves M.D.   On: 11/13/2015 01:30   Ct Chest Wo Contrast  11/13/2015  CLINICAL DATA:  Chest pain, chronic back pain, and shortness of breath since yesterday. Current smoker. EXAM: CT CHEST WITHOUT CONTRAST TECHNIQUE: Multidetector CT imaging of the chest was performed following the standard protocol without IV contrast. COMPARISON:  06/27/2014 FINDINGS: Cardiovascular: Normal heart size.  Normal caliber thoracic aorta. Mediastinum/Nodes: No significant lymphadenopathy in the chest. Esophagus is decompressed. Lungs/Pleura: Multiple pulmonary nodules demonstrated throughout both lungs, many with cavitation. Margins are indistinct and some nodules demonstrate ground-glass appearance. Nodules are new since previous study. Appearance is favored to represent infectious or inflammatory process such as multifocal pneumonia, fungal disease, septic emboli, TB, or Wegener's disease. No pleural effusions.  No pneumothorax. Upper Abdomen: Visualized upper abdominal organs are unremarkable. Musculoskeletal: No destructive bone lesions. IMPRESSION:  Multifocal nodular infiltration throughout both lungs, many with cavitation. Significant progression since previous study. Favor infectious or inflammatory process as described above. Follow-up after treatment is recommended to exclude underlying neoplastic disease. Electronically Signed   By: Burman NievesWilliam  Stevens M.D.   On: 11/13/2015 06:10    EKG: Orders placed or performed during the hospital encounter of 11/13/15  . ED EKG  . ED EKG  . EKG 12-Lead  . EKG 12-Lead    IMPRESSION AND PLAN: 40 year old female patient with history of hepatitis C, substance abuse, anemia, bronchial asthma presented to the emergency room with difficulty breathing, fever and chest discomfort. Admitting diagnosis 1. Sepsis 2. Bilateral infectious pneumonia 3. Multiple cavitary pulmonary nodules rule out tuberculosis 4. Hypokalemia 5. Hyponatremia 6. Substance abuse 7. Altered mental status Treatment plan Admit patient to medical floor IV fluid hydration Potassium supplementation Start patient on IV vancomycin and IV Zosyn antibiotic Respiratory isolation Sputum for AFB Monitor electrolytes Pulmonary consultation Substance abuse counseling once patient is more awake and alert Supportive care.  All the records are reviewed and case discussed with ED provider. Management plans discussed with the patient, family and they are in agreement.  CODE STATUS:FULL    Code Status Orders        Start     Ordered   11/13/15 0613  Full code   Continuous     11/13/15 0614    Code Status History    Date Active Date Inactive Code Status Order ID Comments User Context   04/28/2013  7:42 AM 04/29/2013  4:15 PM Full Code [id number]  Almond LintFaera Byerly, MD Inpatient       TOTAL TIME TAKING CARE OF THIS PATIENT: 55 minutes.    Ihor AustinPavan Pyreddy M.D on 11/13/2015 at 6:16  AM  Between 7am to 6pm - Pager - [phone]  After 6pm go to [email or web address] - password [email or web address] [facility]  [facility]  Office  [phone]  CC: Primary care physician; No PCP Per Patient

## 2015-11-13 NOTE — ED Notes (Signed)
Unable to start ordered fluids and IV antibiotics for code sepsis due to the fact that ultrasound placed IV infiltrated. Will attempt to get new IV access.

## 2015-11-13 NOTE — ED Provider Notes (Signed)
[facility] Emergency Department Provider Note    ____________________________________________  Time seen: ~0250  I have reviewed the triage vital signs and the nursing notes.   HISTORY  Chief Complaint Chest Pain   History limited by: Not Limited   HPI Andrea King is a 40 y.o. female presents to the emergency department todaywith primary complaint of chest and back pain. The patient states that this pain is been present for the past 3-4 days. It is severe. It has been constant and getting worse. She has had some associated shortness of breath or fevers. Denies any cough. She states it does somewhat remind her of when she had a pneumonia number of years ago however this pain is worse.   History reviewed. No pertinent past medical history.  Patient Active Problem List   Diagnosis Date Noted  . Incarcerated femoral hernia left 04/28/2013    Past Surgical History  Procedure Laterality Date  . Tumor excision      left scalpula  . Inguinal hernia repair Left 04/28/2013    Procedure: HERNIA REPAIR INGUINAL ADULT with mesh;  Surgeon: Cherylynn RidgesJames O Wyatt, MD;  Location: [facility] OR;  Service: General;  Laterality: Left;    Current Outpatient Rx  Name  Route  Sig  Dispense  Refill  . acetaminophen (TYLENOL) 500 MG tablet   Oral   Take 1,000 mg by mouth every 6 (six) hours as needed for moderate pain.         Marland Kitchen. docusate sodium 100 MG CAPS   Oral   Take 100 mg by mouth 2 (two) times daily as needed for mild constipation.   10 capsule   0   . HYDROcodone-acetaminophen (NORCO/VICODIN) 5-325 MG per tablet   Oral   Take 1-2 tablets by mouth every 6 (six) hours as needed for moderate pain.   30 tablet   0   . ibuprofen (ADVIL,MOTRIN) 200 MG tablet   Oral   Take 600 mg by mouth every 6 (six) hours as needed for moderate pain.         . traMADol (ULTRAM) 50 MG tablet   Oral   Take 1 tablet (50 mg total) by mouth every 6 (six) hours as needed for pain.    20 tablet   0     Allergies Review of patient's allergies indicates no known allergies.  No family history on file.  Social History Social History  Substance Use Topics  . Smoking status: Current Every Day Smoker    Types: Cigarettes  . Smokeless tobacco: None  . Alcohol Use: No    Review of Systems  Constitutional: Positive for fever. Cardiovascular: Positive for chest pain. Respiratory: Positive for shortness of breath. Gastrointestinal: Negative for abdominal pain, vomiting and diarrhea. Genitourinary: Negative for dysuria. Musculoskeletal: Positive for back pain Skin: Negative for rash. Neurological: Negative for headaches, focal weakness or numbness.   10-point ROS otherwise negative.  ____________________________________________   PHYSICAL EXAM:  VITAL SIGNS: ED Triage Vitals  Enc Vitals Group     BP 11/13/15 0025 116/66 mmHg     Pulse Rate 11/13/15 0025 127     Resp 11/13/15 0025 18     Temp 11/13/15 0025 100.3 F (37.9 C)     Temp Source 11/13/15 0025 Oral     SpO2 11/13/15 0025 100 %     Weight 11/13/15 0025 120 lb (54.432 kg)     Height 11/13/15 0025 5\' 2"  (1.575 m)     Head Cir --  Peak Flow --      Pain Score 11/13/15 0026 4   Constitutional: Alert and oriented. Appears uncomfortable. Eyes: Conjunctivae are normal. PERRL. Normal extraocular movements. ENT   Head: Normocephalic and atraumatic.   Nose: No congestion/rhinnorhea.   Mouth/Throat: Mucous membranes are moist.   Neck: No stridor. Hematological/Lymphatic/Immunilogical: No cervical lymphadenopathy. Cardiovascular: Normal rate, regular rhythm.  No murmurs, rubs, or gallops. Respiratory: Normal respiratory effort without tachypnea nor retractions. Breath sounds are clear and equal bilaterally. No wheezes/rales/rhonchi. Gastrointestinal: Soft and nontender. No distention.  Genitourinary: Deferred Musculoskeletal: Normal range of motion in all extremities. No joint  effusions.  No lower extremity tenderness nor edema. Neurologic:  Normal speech and language. No gross focal neurologic deficits are appreciated.  Skin:  Skin is warm, dry and intact. No rash noted. Psychiatric: Mood and affect are normal. Speech and behavior are normal. Patient exhibits appropriate insight and judgment.  ____________________________________________    LABS (pertinent positives/negatives)  Labs Reviewed  BASIC METABOLIC PANEL - Abnormal; Notable for the following:    Sodium 129 (*)    Potassium 2.9 (*)    Chloride 96 (*)    Glucose, Bld 156 (*)    Calcium 8.5 (*)    All other components within normal limits  CBC - Abnormal; Notable for the following:    WBC 27.0 (*)    MCV 78.6 (*)    RDW 16.0 (*)    All other components within normal limits  URINALYSIS COMPLETEWITH MICROSCOPIC ([facility] ONLY) - Abnormal; Notable for the following:    Color, Urine YELLOW (*)    APPearance CLEAR (*)    Hgb urine dipstick 1+ (*)    Protein, ur 100 (*)    Bacteria, UA RARE (*)    Squamous Epithelial / LPF 0-5 (*)    All other components within normal limits  CULTURE, BLOOD (ROUTINE X 2)  CULTURE, BLOOD (ROUTINE X 2)  URINE CULTURE  TROPONIN I  LACTIC ACID, PLASMA  PREGNANCY, URINE     ____________________________________________   EKG  I, Phineas Semen, attending physician, personally viewed and interpreted this EKG  EKG Time: 0023 Rate: 123 Rhythm: sinus tachycardia Axis: normal Intervals: qtc 426 QRS: LVH ST changes: no st elevation Impression: abnormal ekg ____________________________________________    RADIOLOGY  CXR IMPRESSION: Multiple bilateral pulmonary nodules appear new since previous study, possibly metastatic.  CT chest pending  ___________________________________________   PROCEDURES  Procedure(s) performed: Angiocatheter placement, see procedure note(s).  Critical Care performed: Yes, see critical care note(s)  Angiocath  insertion Performed by: Phineas Semen  Consent: Verbal consent obtained. Risks and benefits: risks, benefits and alternatives were discussed Time out: Immediately prior to procedure a "time out" was called to verify the correct patient, procedure, equipment, support staff and site/side marked as required.  Preparation: Patient was prepped and draped in the usual sterile fashion.  Vein Location: right AC  Ultrasound Guided  Gauge: 20  Normal blood return and flush without difficulty Patient tolerance: Patient tolerated the procedure well with no immediate complications.  CRITICAL CARE Performed by: Phineas Semen   Total critical care time: 35 minutes  Critical care time was exclusive of separately billable procedures and treating other patients.  Critical care was necessary to treat or prevent imminent or life-threatening deterioration.  Critical care was time spent personally by me on the following activities: development of treatment plan with patient and/or surrogate as well as nursing, discussions with consultants, evaluation of patient's response to treatment, examination of patient, obtaining history  from patient or surrogate, ordering and performing treatments and interventions, ordering and review of laboratory studies, ordering and review of radiographic studies, pulse oximetry and re-evaluation of patient's condition.   ____________________________________________   INITIAL IMPRESSION / ASSESSMENT AND PLAN / ED COURSE  Pertinent labs & imaging results that were available during my care of the patient were reviewed by me and considered in my medical decision making (see chart for details).  Patient presented to the emergency department today with primary complaints of chest and back pain as well as shortness of breath with fever. Patient's x-ray was concerning for multiple pulmonary nodules. In addition the patient's white blood cell count was 27 and temperature  was 100.3. Given these constellation of findings the patient was called a code sepsis. Additionally I will obtain a chest CT to further evaluate the chest x-ray findings.  ----------------------------------------- 5:51 AM on 11/13/2015 -----------------------------------------  CT still pending. Patient's blood work did drop and the initial IV that I obtained it did infiltrate. I was able to obtain a second IV in the left upper extremity. Patient will be given fluids. Patient will be admitted to the hospitalist service for further workup and evaluation. ____________________________________________   FINAL CLINICAL IMPRESSION(S) / ED DIAGNOSES  Final diagnoses:  Community acquired pneumonia  Sepsis, due to unspecified organism [facility])     Note: This dictation was prepared with Office manager. Any transcriptional errors that result from this process are unintentional    Phineas Semen, MD 11/13/15 [phone]

## 2015-11-13 NOTE — Progress Notes (Signed)
Pharmacy Antibiotic Note  Andrea King is a 40 y.o. female admitted on 11/13/2015 with sepsis.  Pharmacy has been consulted for vancomycin and Zosyn dosing.  Plan: DW 54.4kg  Vd 38L kei 0.065 hr-1  T1/2 11 hours Vancomycin 750 mg q 12 hours ordered with stacked dosing. Level before 5th dose. Goal trough 15-20.  Zosyn 3.375 grams q 8 hours ordered.    Height: 5\' 2"  (157.5 cm) Weight: 120 lb (54.432 kg) IBW/kg (Calculated) : 50.1  Temp (24hrs), Avg:100.3 F (37.9 C), Min:100.3 F (37.9 C), Max:100.3 F (37.9 C)   Recent Labs Lab 11/13/15 0100 11/13/15 0333  WBC 27.0*  --   CREATININE 0.67  --   LATICACIDVEN  --  1.6    Estimated Creatinine Clearance: 73.2 mL/min (by C-G formula based on Cr of 0.67).    No Known Allergies  Antimicrobials this admission: vancomycin  >>  Zosyn  >>   Dose adjustments this admission:   Microbiology results: 7/12 BCx: pending 7/12 UCx: pending  7/12 SputumCx: pending  7/12 UA: (-)  Thank you for allowing pharmacy to be a part of this patient's care.  Iosefa Weintraub S 11/13/2015 6:17 AM

## 2015-11-13 NOTE — Progress Notes (Signed)
Nurse attempted to do admission questions, Pt too drowsy. Pt is requesting pain medicine. MD Vachhani notified. Per MD pt needs to be more alert to have pain medicine.

## 2015-11-13 NOTE — ED Notes (Signed)
Pt returned from CT, very lethargic, hard to awaken. Will respond to voice but not as responsive as she was prior to going to CT. Admitting MD at bedside and aware.

## 2015-11-13 NOTE — Progress Notes (Signed)
PHARMACY - PHYSICIAN COMMUNICATION CRITICAL VALUE ALERT - BLOOD CULTURE IDENTIFICATION (BCID)  Results for orders placed or performed during the hospital encounter of 11/13/15  Blood Culture ID Panel (Reflexed) (Collected: 11/13/2015  3:30 AM)  Result Value Ref Range   Enterococcus species NOT DETECTED NOT DETECTED   Vancomycin resistance NOT DETECTED NOT DETECTED   Listeria monocytogenes NOT DETECTED NOT DETECTED   Staphylococcus species NOT DETECTED NOT DETECTED   Staphylococcus aureus DETECTED (A) NOT DETECTED   Methicillin resistance DETECTED (A) NOT DETECTED   Streptococcus species NOT DETECTED NOT DETECTED   Streptococcus agalactiae NOT DETECTED NOT DETECTED   Streptococcus pneumoniae NOT DETECTED NOT DETECTED   Streptococcus pyogenes NOT DETECTED NOT DETECTED   Acinetobacter baumannii NOT DETECTED NOT DETECTED   Enterobacteriaceae species NOT DETECTED NOT DETECTED   Enterobacter cloacae complex NOT DETECTED NOT DETECTED   Escherichia coli NOT DETECTED NOT DETECTED   Klebsiella oxytoca NOT DETECTED NOT DETECTED   Klebsiella pneumoniae NOT DETECTED NOT DETECTED   Proteus species NOT DETECTED NOT DETECTED   Serratia marcescens NOT DETECTED NOT DETECTED   Carbapenem resistance NOT DETECTED NOT DETECTED   Haemophilus influenzae NOT DETECTED NOT DETECTED   Neisseria meningitidis NOT DETECTED NOT DETECTED   Pseudomonas aeruginosa NOT DETECTED NOT DETECTED   Candida albicans NOT DETECTED NOT DETECTED   Candida glabrata NOT DETECTED NOT DETECTED   Candida krusei NOT DETECTED NOT DETECTED   Candida parapsilosis NOT DETECTED NOT DETECTED   Candida tropicalis NOT DETECTED NOT DETECTED    Name of physician (or Provider) Contacted: Dr Renae GlossWieting   Changes to prescribed antibiotics required: No, pt already on Vancomycin.  Dr Renae GlossWieting will defer to rounding MD on 7/13 to decide if zosyn should be d/c'd.   Annaliese Saez D 11/13/2015  6:35 PM

## 2015-11-13 NOTE — Progress Notes (Signed)
Pt requesting Lidocaine patch. Prime doc paged. MD Hilton SinclairWeiting returned page, order received and placed.

## 2015-11-13 NOTE — Progress Notes (Signed)
[facility] - [place] at [facility] [address]   PATIENT NAME: Andrea King    MR#:  [id number]  DATE OF BIRTH:  05-20-1975  SUBJECTIVE:  CHIEF COMPLAINT:   Chief Complaint  Patient presents with  . Chest Pain    came with pain in chest , lethargy, and chills.  She have Multiple infective foci in both lungs.  She was sleeping comfortably in room, but when i interviewed- she started asking for pain meds.  REVIEW OF SYSTEMS:  CONSTITUTIONAL: No fever, fatigue or weakness.  EYES: No blurred or double vision.  EARS, NOSE, AND THROAT: No tinnitus or ear pain.  RESPIRATORY: positive cough, shortness of breath, no wheezing or hemoptysis.  CARDIOVASCULAR: complains of chest pain, orthopnea, edema.  GASTROINTESTINAL: No nausea, vomiting, diarrhea or abdominal pain.  GENITOURINARY: No dysuria, hematuria.  ENDOCRINE: No polyuria, nocturia,  HEMATOLOGY: No anemia, easy bruising or bleeding SKIN: No rash or lesion. MUSCULOSKELETAL: No joint pain or arthritis.   NEUROLOGIC: No tingling, numbness, weakness.  PSYCHIATRY: No anxiety or depression.   ROS  DRUG ALLERGIES:  No Known Allergies  VITALS:  Blood pressure 104/72, pulse 101, temperature 98.1 F (36.7 C), temperature source Oral, resp. rate 19, height  (1.575 m), weight 54.432 kg (120 lb), last menstrual period 11/13/2015, SpO2 100 %.  PHYSICAL EXAMINATION:  GENERAL:  40 y.o.-year-old patient lying in the bed with anxiety and c/o pain.  EYES: Pupils equal, round, reactive to light and accommodation. No scleral icterus. Extraocular muscles intact.  HEENT: Head atraumatic, normocephalic. Oropharynx and nasopharynx clear.  NECK:  Supple, no jugular venous distention. No thyroid enlargement, no tenderness.  LUNGS: Normal breath sounds bilaterally, some wheezing, some crepitation. No use of accessory muscles of respiration.  CARDIOVASCULAR: S1, S2 normal. No murmurs, rubs, or gallops.  ABDOMEN: Soft, nontender,  nondistended. Bowel sounds present. No organomegaly or mass.  EXTREMITIES: No pedal edema, cyanosis, or clubbing.  NEUROLOGIC: Cranial nerves II through XII are intact. Muscle strength 5/5 in all extremities. Sensation intact. Gait not checked.  PSYCHIATRIC: The patient is alert and some anxious with pain, but trying to go to sleep inbetween.  SKIN: No obvious rash, lesion, or ulcer.   Physical Exam LABORATORY PANEL:   CBC  Recent Labs Lab 11/13/15 0100  WBC 27.0*  HGB 12.0  HCT 35.1  PLT 285   ------------------------------------------------------------------------------------------------------------------  Chemistries   Recent Labs Lab 11/13/15 0100  NA 129*  K 2.9*  CL 96*  CO2 23  GLUCOSE 156*  BUN 6  CREATININE 0.67  CALCIUM 8.5*   ------------------------------------------------------------------------------------------------------------------  Cardiac Enzymes  Recent Labs Lab 11/13/15 0100  TROPONINI <0.03   ------------------------------------------------------------------------------------------------------------------  RADIOLOGY:  Dg Chest 2 View  11/13/2015  CLINICAL DATA:  Chest pain, chronic back pain, and shortness of breath since yesterday. Current smoker. EXAM: CHEST  2 VIEW COMPARISON:  06/29/2014 FINDINGS: New pulmonary nodules are demonstrated in the upper lungs bilaterally, right nodule measuring 15 mm, left apical nodular measuring 9 mm, left mid lung nodule measuring 13 mm, and left upper lung nodule measuring 13 mm. Findings are suspicious for metastatic disease. Previously demonstrated right mid lung nodule is not appreciated today. Emphysematous changes in the lungs with interstitial pattern suggesting fibrosis. No focal consolidation. Normal heart size and pulmonary vascularity. No blunting of costophrenic angles. No pneumothorax. IMPRESSION: Multiple bilateral pulmonary nodules appear new since previous study, possibly metastatic.  Electronically Signed   By: Burman Nieves M.D.   On: 11/13/2015 01:30   Ct Chest  Wo Contrast  11/13/2015  CLINICAL DATA:  Chest pain, chronic back pain, and shortness of breath since yesterday. Current smoker. EXAM: CT CHEST WITHOUT CONTRAST TECHNIQUE: Multidetector CT imaging of the chest was performed following the standard protocol without IV contrast. COMPARISON:  06/27/2014 FINDINGS: Cardiovascular: Normal heart size.  Normal caliber thoracic aorta. Mediastinum/Nodes: No significant lymphadenopathy in the chest. Esophagus is decompressed. Lungs/Pleura: Multiple pulmonary nodules demonstrated throughout both lungs, many with cavitation. Margins are indistinct and some nodules demonstrate ground-glass appearance. Nodules are new since previous study. Appearance is favored to represent infectious or inflammatory process such as multifocal pneumonia, fungal disease, septic emboli, TB, or Wegener's disease. No pleural effusions. No pneumothorax. Upper Abdomen: Visualized upper abdominal organs are unremarkable. Musculoskeletal: No destructive bone lesions. IMPRESSION: Multifocal nodular infiltration throughout both lungs, many with cavitation. Significant progression since previous study. Favor infectious or inflammatory process as described above. Follow-up after treatment is recommended to exclude underlying neoplastic disease. Electronically Signed   By: Burman NievesWilliam  Stevens M.D.   On: 11/13/2015 06:10    ASSESSMENT AND PLAN:   Principal Problem:   Sepsis (HCC) Active Problems:   Community acquired pneumonia   Pneumonia   Substance induced mood disorder (HCC)   Opiate withdrawal (HCC)   Opiate abuse, continuous  * sepsis   Due to b/l multifocal pneumonia and cavitary lesions.   IV Abx.   Sputum cultures, AFB collection.   Pulm and ID consult.   Also do Blood cx, as high risk for endocarditis secondary to IV drug use.   * IV drug abuse    She has multi drug positive in urine.    Agitated,  and asking for pain meds.    Had tachycardia and tachypnea    Likely opioid withdrawal    Called psych consult    Appreciated help - start methadone.  * hypokalemia   Check magnesium, replace orally.  * hyponatremia   IV NS- recheck tomorrow.      All the records are reviewed and case discussed with Care Management/Social Workerr. Management plans discussed with the patient, family and they are in agreement.  CODE STATUS: Full.  TOTAL TIME TAKING CARE OF THIS PATIENT: 35 minutes.     POSSIBLE D/C IN 2-3 DAYS, DEPENDING ON CLINICAL CONDITION.   Altamese DillingVACHHANI, Nollan Muldrow M.D on 11/13/2015   Between 7am to 6pm - Pager - [phone]  After 6pm go to [email or web address] - Social research officer, governmentpassword [email or web address] [email or web address]  [facility]  Office  [phone]  CC: Primary care physician; No PCP Per Patient  Note: This dictation was prepared with Dragon dictation along with smaller phrase technology. Any transcriptional errors that result from this process are unintentional.

## 2015-11-13 NOTE — ED Notes (Signed)
Pt returned from CT °

## 2015-11-13 NOTE — Care Management Note (Signed)
Case Management Note  Patient Details  Name: Andrea King MRN: [id number] Date of Birth: 1976-04-02  Subjective/Objective:   Spoke with patient who is in quite a bit of pain. Patient stated that she lives with her uncle , Has no job or income at this time. Will likely need to go on IVABX at discharge. Patietn admits to IV drug use which Erby Pianmayh be a barrier to this. Will continue to follow. Discussed with CSW.           Action/Plan: To be determined.   Expected Discharge Date:                  Expected Discharge Plan:     In-House Referral:     Discharge planning Services  CM Consult  Post Acute Care Choice:    Choice offered to:     DME Arranged:    DME Agency:     HH Arranged:    HH Agency:     Status of Service:  In process, will continue to follow  If discussed at Long Length of Stay Meetings, dates discussed:    Additional Comments:  Andrea King, Andrea Huckeba L, RN 11/13/2015, 3:16 PM

## 2015-11-13 NOTE — ED Notes (Signed)
This RN and Sue LushAndrea, RN made attempts x2 to get IV access without success. MD aware.

## 2015-11-13 NOTE — ED Notes (Addendum)
Patient presents to ED with c/o chest pain, chronic back pain, and shortness of breath since yesterday. Pt denies radiating pain. Pt denies abdominal pain or nausea. Pt has Lidoderm patches on back. Pt alert and oriented x 4, no increased work in breathing noted.

## 2015-11-13 NOTE — Progress Notes (Addendum)
Lab called to notify pts Lactic Acid is 2.2. MD Elisabeth PigeonVachhani paged. Orders received for lab redraw lactic acid in 4 hours. Order placed

## 2015-11-14 ENCOUNTER — Inpatient Hospital Stay
Admit: 2015-11-14 | Discharge: 2015-11-14 | Disposition: A | Discharge status: Home / Self Care | Payer: Medicaid Other | Attending: Internal Medicine | Admitting: Internal Medicine

## 2015-11-14 ENCOUNTER — Inpatient Hospital Stay: Payer: Medicaid Other

## 2015-11-14 LAB — LACTIC ACID, PLASMA: Lactic Acid, Venous: 2.3 mmol/L (ref 0.5–1.9)

## 2015-11-14 LAB — CBC
HEMATOCRIT: 30.4 % — AB (ref 35.0–47.0)
Hemoglobin: 10.1 g/dL — ABNORMAL LOW (ref 12.0–16.0)
MCH: 26.5 pg (ref 26.0–34.0)
MCHC: 33.3 g/dL (ref 32.0–36.0)
MCV: 79.5 fL — ABNORMAL LOW (ref 80.0–100.0)
PLATELETS: 249 10*3/uL (ref 150–440)
RBC: 3.82 MIL/uL (ref 3.80–5.20)
RDW: 16.1 % — AB (ref 11.5–14.5)
WBC: 23.4 10*3/uL — AB (ref 3.6–11.0)

## 2015-11-14 LAB — BASIC METABOLIC PANEL
Anion gap: 8 (ref 5–15)
BUN: 6 mg/dL (ref 6–20)
CHLORIDE: 102 mmol/L (ref 101–111)
CO2: 21 mmol/L — ABNORMAL LOW (ref 22–32)
CREATININE: 0.54 mg/dL (ref 0.44–1.00)
Calcium: 7.8 mg/dL — ABNORMAL LOW (ref 8.9–10.3)
Glucose, Bld: 157 mg/dL — ABNORMAL HIGH (ref 65–99)
POTASSIUM: 3.7 mmol/L (ref 3.5–5.1)
SODIUM: 131 mmol/L — AB (ref 135–145)

## 2015-11-14 LAB — URINE CULTURE: Culture: NO GROWTH

## 2015-11-14 MED ORDER — IBUPROFEN 400 MG PO TABS
400.0000 mg | ORAL_TABLET | Freq: Four times a day (QID) | ORAL | Status: DC | PRN
  Administered 2015-11-14 – 2015-11-20 (×15): 400 mg via ORAL
  Filled 2015-11-14 (×15): qty 1

## 2015-11-14 MED ORDER — AMITRIPTYLINE HCL 50 MG PO TABS
25.0000 mg | ORAL_TABLET | Freq: Every day | ORAL | Status: DC
  Administered 2015-11-14 – 2015-11-19 (×5): 25 mg via ORAL
  Filled 2015-11-14 (×6): qty 1

## 2015-11-14 MED ORDER — METHOCARBAMOL 500 MG PO TABS
750.0000 mg | ORAL_TABLET | Freq: Three times a day (TID) | ORAL | Status: DC
  Administered 2015-11-14 – 2015-11-20 (×16): 750 mg via ORAL
  Filled 2015-11-14: qty 2
  Filled 2015-11-14: qty 1
  Filled 2015-11-14 (×7): qty 2
  Filled 2015-11-14: qty 1
  Filled 2015-11-14 (×8): qty 2

## 2015-11-14 NOTE — Progress Notes (Signed)
Lab called about a critical Lactic Acid at 2.3. Dr called no new orders given, will continue to monitor.

## 2015-11-14 NOTE — Consult Note (Signed)
[facility] Consult   Reason for Consult:  Consult for this 40 year old woman currently in the hospital with probable sepsis related to a pulmonary process. History of opiate abuse. Complaining of pain. Referring Physician:  Anselm Jungling Patient Identification: Andrea King MRN:  [id number] Principal Diagnosis: Opiate withdrawal Diagnosis:   Patient Active Problem List   Diagnosis Date Noted  . Sepsis ([place]) [A41.9] 11/13/2015  . Community acquired pneumonia [J18.9] 11/13/2015  . Pneumonia [J18.9] 11/13/2015  . Substance induced mood disorder ([place]) [F19.94] 11/13/2015  . Opiate withdrawal ([place]) [F11.23] 11/13/2015  . Opiate abuse, continuous [F11.10] 11/13/2015  . Incarcerated femoral hernia left [K41.30] 04/28/2013    Total Time spent with patient: 20 minutes  Subjective:   Andrea King is a 40 y.o. female patient admitted with "I am in pain, can you help me?".  Follow-up note for Thursday the 75th. 40 year old woman with opiate dependence pneumonia and pain. Patient continues to complain of pain today and although she describes her pain as "intolerable" she clearly is in less distress than she was yesterday. X-ray studies show that she does have a rib fracture in her 12th rib otherwise doesn't look like any obvious bone abnormality. Patient tells me that she feels like she is having some muscle spasms. She's been able to eat a little bit better today. Mood is down and dysphoric but no suicidal ideation no psychosis.  HPI:  Patient interviewed. Chart reviewed including current and old her chart. Labs and vitals reviewed including chest CT scan. 40 year old woman presented to the emergency room complaining of chest and back pain and shortness of breath. Workup shows what appears to be a likely infectious process in the lungs. Further workup still in place. Consult because she is complaining of pain and has a known history of opiate abuse. On interview today I found the  patient only partially cooperative. When I saw her through the window and had not come into the room yet she appeared to be lying calmly. Once I came in and started talking to her she became very agitated and started moaning and writhing and shouting about pain. Patient reports that she has severe pain in her back and ribs which has been present for about a week. She says her mood is bad but relates this to her physical discomfort. Denied any actual suicidal ideation but made comments about how she didn't want to live in her current state anymore. No intent or plan of actually harming herself. Denies having any hallucinations. No evident paranoia. Has not been sleeping or eating well recently. Patient admits that she abuses intravenous heroin but insists to me that the pain she is having couldn't possibly be anything related to withdrawal. She claims she last had some heroin about a week ago. She denied that she was using any other drugs at all. Denied any alcohol use. Told me at one point that she had a prescription for Xanax but later did not mention being on any benzodiazepines. Information from the chart shows that her urine drug screen process today is positive for opiates amphetamines and cocaine and benzodiazepines. Looking at her controlled substance database she has not been receiving any prescriptions for benzodiazepines recently. She had been on Suboxone maintenance treatment but the last prescription was written in May.  Social history: Patient says she lives with an uncle. Not currently working. Says that she feels like she has a safe living situation.  Medical history: Patient has a history of a presentation was similar  symptoms and findings in the past but at that time she walked out [place]. She says that she healed up from that and didn't have any further complications. I see that she has had visits with obstetricians and has had children delivered in the not so distant past. No  other ongoing active medical problems.  Substance abuse history: Long history of opiate abuse. She was not very cooperative with giving me details of it. Was in Suboxone treatment during her pregnancy but apparently has not continue to follow-up with that. Opiates appear to be drug of choice. Unclear how much sobriety she has had.  Past Psychiatric History: Patient told me she thinks that she might of had psychiatric treatment or even hospitalization in the distant past but can't remember what for. We can't find any documentation in our records of other psychiatric treatment. Denies ever trying to kill her self in the past.  Risk to Self: Is patient at risk for suicide?: No Risk to Others:   Prior Inpatient Therapy:   Prior Outpatient Therapy:    Past Medical History:  Past Medical History  Diagnosis Date  . Hepatitis C   . Asthma   . Substance abuse   . Anemia   . DJD (degenerative joint disease)     Past Surgical History  Procedure Laterality Date  . Tumor excision      left scalpula  . Inguinal hernia repair Left 04/28/2013    Procedure: HERNIA REPAIR INGUINAL ADULT with mesh;  Surgeon: Gwenyth Ober, MD;  Location: [facility] OR;  Service: General;  Laterality: Left;   Family History:  Family History  Problem Relation Age of Onset  . Cancer Mother   . Cancer - Colon Maternal Grandfather   . Diabetes Maternal Grandfather    Family Psychiatric  History: Patient denies any family history of mental health or substance abuse problems Social History:  History  Alcohol Use No     History  Drug Use  . Yes    Comment: cocaine,Heroin    Social History   Social History  . Marital Status: Single    Spouse Name: N/A  . Number of Children: N/A  . Years of Education: N/A   Occupational History  . unemployed    Social History Main Topics  . Smoking status: Current Every Day Smoker    Types: Cigarettes  . Smokeless tobacco: None  . Alcohol Use: No  . Drug Use: Yes     Comment:  cocaine,Heroin  . Sexual Activity: Yes    Birth Control/ Protection: None   Other Topics Concern  . None   Social History Narrative   Additional Social History:    Allergies:  No Known Allergies  Labs:  Results for orders placed or performed during the hospital encounter of 11/13/15 (from the past 48 hour(s))  Rapid HIV screen (HIV 1/2 Ab+Ag)     Status: None   Collection Time: 11/13/15 12:14 AM  Result Value Ref Range   HIV-1 P24 Antigen - HIV24 NON REACTIVE NON REACTIVE   HIV 1/2 Antibodies NON REACTIVE NON REACTIVE   Interpretation (HIV Ag Ab)      A non reactive test result means that HIV 1 or HIV 2 antibodies and HIV 1 p24 antigen were not detected in the specimen.  Basic metabolic panel     Status: Abnormal   Collection Time: 11/13/15  1:00 AM  Result Value Ref Range   Sodium 129 (L) 135 - 145 mmol/L  Potassium 2.9 (L) 3.5 - 5.1 mmol/L   Chloride 96 (L) 101 - 111 mmol/L   CO2 23 22 - 32 mmol/L   Glucose, Bld 156 (H) 65 - 99 mg/dL   BUN 6 6 - 20 mg/dL   Creatinine, Ser 0.67 0.44 - 1.00 mg/dL   Calcium 8.5 (L) 8.9 - 10.3 mg/dL   GFR calc non Af Amer >60 >60 mL/min   GFR calc Af Amer >60 >60 mL/min    Comment: (NOTE) The eGFR has been calculated using the CKD EPI equation. This calculation has not been validated in all clinical situations. eGFR's persistently <60 mL/min signify possible Chronic Kidney Disease.    Anion gap 10 5 - 15  CBC     Status: Abnormal   Collection Time: 11/13/15  1:00 AM  Result Value Ref Range   WBC 27.0 (H) 3.6 - 11.0 K/uL   RBC 4.46 3.80 - 5.20 MIL/uL   Hemoglobin 12.0 12.0 - 16.0 g/dL   HCT 35.1 35.0 - 47.0 %   MCV 78.6 (L) 80.0 - 100.0 fL   MCH 26.9 26.0 - 34.0 pg   MCHC 34.2 32.0 - 36.0 g/dL   RDW 16.0 (H) 11.5 - 14.5 %   Platelets 285 150 - 440 K/uL  Troponin I     Status: None   Collection Time: 11/13/15  1:00 AM  Result Value Ref Range   Troponin I <0.03 <0.03 ng/mL  Blood Culture (routine x 2)     Status: Abnormal  (Preliminary result)   Collection Time: 11/13/15  3:30 AM  Result Value Ref Range   Specimen Description BLOOD RIGHT HAND    Special Requests      BOTTLES DRAWN AEROBIC AND ANAEROBIC  6CCAERO, 5CCANA   Culture  Setup Time      GRAM POSITIVE COCCI IN BOTH AEROBIC AND ANAEROBIC BOTTLES CRITICAL VALUE NOTED.  VALUE IS CONSISTENT WITH PREVIOUSLY REPORTED AND CALLED VALUE. Performed at [facility] (A)    Report Status PENDING   Blood Culture (routine x 2)     Status: Abnormal (Preliminary result)   Collection Time: 11/13/15  3:30 AM  Result Value Ref Range   Specimen Description BLOOD RIGHT ASSIST CONTROL    Special Requests      BOTTLES DRAWN AEROBIC AND ANAEROBIC  9CCAERO,6CCANA   Culture  Setup Time      GRAM POSITIVE COCCI IN BOTH AEROBIC AND ANAEROBIC BOTTLES CRITICAL RESULT CALLED TO, READ BACK BY AND VERIFIED WITH: JASON ROBBINS AT 1610 11/13/15 SDR    Culture STAPHYLOCOCCUS AUREUS (A)    Report Status PENDING   Blood Culture ID Panel (Reflexed)     Status: Abnormal   Collection Time: 11/13/15  3:30 AM  Result Value Ref Range   Enterococcus species NOT DETECTED NOT DETECTED   Vancomycin resistance NOT DETECTED NOT DETECTED   Listeria monocytogenes NOT DETECTED NOT DETECTED   Staphylococcus species NOT DETECTED NOT DETECTED   Staphylococcus aureus DETECTED (A) NOT DETECTED    Comment: CRITICAL RESULT CALLED TO, READ BACK BY AND VERIFIED WITH: JASON ROBBINS AT 1757 11/13/15 SDR    Methicillin resistance DETECTED (A) NOT DETECTED    Comment: CRITICAL RESULT CALLED TO, READ BACK BY AND VERIFIED WITH: JASON ROBBINS AT 9604 11/13/15 SDR    Streptococcus species NOT DETECTED NOT DETECTED   Streptococcus agalactiae NOT DETECTED NOT DETECTED   Streptococcus pneumoniae NOT DETECTED NOT DETECTED   Streptococcus pyogenes NOT DETECTED NOT  DETECTED   Acinetobacter baumannii NOT DETECTED NOT DETECTED   Enterobacteriaceae species NOT DETECTED  NOT DETECTED   Enterobacter cloacae complex NOT DETECTED NOT DETECTED   Escherichia coli NOT DETECTED NOT DETECTED   Klebsiella oxytoca NOT DETECTED NOT DETECTED   Klebsiella pneumoniae NOT DETECTED NOT DETECTED   Proteus species NOT DETECTED NOT DETECTED   Serratia marcescens NOT DETECTED NOT DETECTED   Carbapenem resistance NOT DETECTED NOT DETECTED   Haemophilus influenzae NOT DETECTED NOT DETECTED   Neisseria meningitidis NOT DETECTED NOT DETECTED   Pseudomonas aeruginosa NOT DETECTED NOT DETECTED   Candida albicans NOT DETECTED NOT DETECTED   Candida glabrata NOT DETECTED NOT DETECTED   Candida krusei NOT DETECTED NOT DETECTED   Candida parapsilosis NOT DETECTED NOT DETECTED   Candida tropicalis NOT DETECTED NOT DETECTED  Lactic acid, plasma     Status: None   Collection Time: 11/13/15  3:33 AM  Result Value Ref Range   Lactic Acid, Venous 1.6 0.5 - 1.9 mmol/L  Urinalysis complete, with microscopic ([facility] only)     Status: Abnormal   Collection Time: 11/13/15  3:50 AM  Result Value Ref Range   Color, Urine YELLOW (A) YELLOW   APPearance CLEAR (A) CLEAR   Glucose, UA NEGATIVE NEGATIVE mg/dL   Bilirubin Urine NEGATIVE NEGATIVE   Ketones, ur NEGATIVE NEGATIVE mg/dL   Specific Gravity, Urine 1.020 1.005 - 1.030   Hgb urine dipstick 1+ (A) NEGATIVE   pH 5.0 5.0 - 8.0   Protein, ur 100 (A) NEGATIVE mg/dL   Nitrite NEGATIVE NEGATIVE   Leukocytes, UA NEGATIVE NEGATIVE   RBC / HPF 0-5 0 - 5 RBC/hpf   WBC, UA 0-5 0 - 5 WBC/hpf   Bacteria, UA RARE (A) NONE SEEN   Squamous Epithelial / LPF 0-5 (A) NONE SEEN   Mucous PRESENT   Urine culture     Status: None   Collection Time: 11/13/15  3:50 AM  Result Value Ref Range   Specimen Description URINE, RANDOM    Special Requests NONE    Culture NO GROWTH Performed at [facility]     Report Status 11/14/2015 FINAL   Pregnancy, urine     Status: None   Collection Time: 11/13/15  3:50 AM  Result Value Ref Range   Preg Test,  Ur NEGATIVE NEGATIVE  Urine Drug Screen, Qualitative ([facility] only)     Status: Abnormal   Collection Time: 11/13/15  3:50 AM  Result Value Ref Range   Tricyclic, Ur Screen NONE DETECTED NONE DETECTED   Amphetamines, Ur Screen POSITIVE (A) NONE DETECTED   MDMA (Ecstasy)Ur Screen NONE DETECTED NONE DETECTED   Cocaine Metabolite,Ur [place] POSITIVE (A) NONE DETECTED   Opiate, Ur Screen POSITIVE (A) NONE DETECTED   Phencyclidine (PCP) Ur S NONE DETECTED NONE DETECTED   Cannabinoid 50 Ng, Ur [place] NONE DETECTED NONE DETECTED   Barbiturates, Ur Screen NONE DETECTED NONE DETECTED   Benzodiazepine, Ur Scrn POSITIVE (A) NONE DETECTED   Methadone Scn, Ur NONE DETECTED NONE DETECTED    Comment: (NOTE) 161  Tricyclics, urine               Cutoff 1000 ng/mL 200  Amphetamines, urine             Cutoff 1000 ng/mL 300  MDMA (Ecstasy), urine           Cutoff 500 ng/mL 400  Cocaine Metabolite, urine       Cutoff 300 ng/mL 500  Opiate, urine  Cutoff 300 ng/mL 600  Phencyclidine (PCP), urine      Cutoff 25 ng/mL 700  Cannabinoid, urine              Cutoff 50 ng/mL 800  Barbiturates, urine             Cutoff 200 ng/mL 900  Benzodiazepine, urine           Cutoff 200 ng/mL 1000 Methadone, urine                Cutoff 300 ng/mL 1100 1200 The urine drug screen provides only a preliminary, unconfirmed 1300 analytical test result and should not be used for non-medical 1400 purposes. Clinical consideration and professional judgment should 1500 be applied to any positive drug screen result due to possible 1600 interfering substances. A more specific alternate chemical method 1700 must be used in order to obtain a confirmed analytical result.  1800 Gas chromato graphy / mass spectrometry (GC/MS) is the preferred 1900 confirmatory method.   Lactic acid, plasma     Status: Abnormal   Collection Time: 11/13/15  1:41 PM  Result Value Ref Range   Lactic Acid, Venous 2.2 (HH) 0.5 - 1.9 mmol/L    Comment:  CRITICAL RESULT CALLED TO, READ BACK BY AND VERIFIED WITH TIFFANY COOPER 11/13/15 1453 SGD   Procalcitonin     Status: None   Collection Time: 11/13/15  1:41 PM  Result Value Ref Range   Procalcitonin 0.55 ng/mL    Comment:        Interpretation: PCT > 0.5 ng/mL and <= 2 ng/mL: Systemic infection (sepsis) is possible, but other conditions are known to elevate PCT as well. (NOTE)         ICU PCT Algorithm               Non ICU PCT Algorithm    ----------------------------     ------------------------------         PCT < 0.25 ng/mL                 PCT < 0.1 ng/mL     Stopping of antibiotics            Stopping of antibiotics       strongly encouraged.               strongly encouraged.    ----------------------------     ------------------------------       PCT level decrease by               PCT < 0.25 ng/mL       >= 80% from peak PCT       OR PCT 0.25 - 0.5 ng/mL          Stopping of antibiotics                                             encouraged.     Stopping of antibiotics           encouraged.    ----------------------------     ------------------------------       PCT level decrease by              PCT >= 0.25 ng/mL       < 80% from peak PCT        AND PCT >= 0.5 ng/mL  Continuing antibiotics                                              encouraged.       Continuing antibiotics            encouraged.    ----------------------------     ------------------------------     PCT level increase compared          PCT > 0.5 ng/mL         with peak PCT AND          PCT >= 0.5 ng/mL             Escalation of antibiotics                                          strongly encouraged.      Escalation of antibiotics        strongly encouraged.   Magnesium     Status: None   Collection Time: 11/13/15  1:41 PM  Result Value Ref Range   Magnesium 2.0 1.7 - 2.4 mg/dL  Basic metabolic panel     Status: Abnormal   Collection Time: 11/14/15  3:46 AM  Result Value Ref Range    Sodium 131 (L) 135 - 145 mmol/L   Potassium 3.7 3.5 - 5.1 mmol/L   Chloride 102 101 - 111 mmol/L   CO2 21 (L) 22 - 32 mmol/L   Glucose, Bld 157 (H) 65 - 99 mg/dL   BUN 6 6 - 20 mg/dL   Creatinine, Ser 0.54 0.44 - 1.00 mg/dL   Calcium 7.8 (L) 8.9 - 10.3 mg/dL   GFR calc non Af Amer >60 >60 mL/min   GFR calc Af Amer >60 >60 mL/min    Comment: (NOTE) The eGFR has been calculated using the CKD EPI equation. This calculation has not been validated in all clinical situations. eGFR's persistently <60 mL/min signify possible Chronic Kidney Disease.    Anion gap 8 5 - 15  CBC     Status: Abnormal   Collection Time: 11/14/15  3:46 AM  Result Value Ref Range   WBC 23.4 (H) 3.6 - 11.0 K/uL   RBC 3.82 3.80 - 5.20 MIL/uL   Hemoglobin 10.1 (L) 12.0 - 16.0 g/dL   HCT 30.4 (L) 35.0 - 47.0 %   MCV 79.5 (L) 80.0 - 100.0 fL   MCH 26.5 26.0 - 34.0 pg   MCHC 33.3 32.0 - 36.0 g/dL   RDW 16.1 (H) 11.5 - 14.5 %   Platelets 249 150 - 440 K/uL  Lactic acid, plasma     Status: Abnormal   Collection Time: 11/14/15  3:46 AM  Result Value Ref Range   Lactic Acid, Venous 2.3 (HH) 0.5 - 1.9 mmol/L    Comment: CRITICAL RESULT CALLED TO, READ BACK BY AND VERIFIED WITH VIVIAN ALI ON 11/14/15 AT 0503 BY TLB     Current Facility-Administered Medications  Medication Dose Route Frequency Provider Last Rate Last Dose  . 0.9 %  sodium chloride infusion   Intravenous Continuous Saundra Shelling, MD 125 mL/hr at 11/14/15 1451    . acetaminophen (TYLENOL) tablet 650 mg  650 mg Oral Q6H PRN Saundra Shelling, MD   650 mg at 11/14/15 1451  Or  . acetaminophen (TYLENOL) suppository 650 mg  650 mg Rectal Q6H PRN Saundra Shelling, MD      . amitriptyline (ELAVIL) tablet 25 mg  25 mg Oral QHS Gonzella Lex, MD      . docusate sodium (COLACE) capsule 100 mg  100 mg Oral BID Saundra Shelling, MD   100 mg at 11/13/15 0950  . enoxaparin (LOVENOX) injection 40 mg  40 mg Subcutaneous Q24H Saundra Shelling, MD   40 mg at 11/13/15 1130  .  ibuprofen (ADVIL,MOTRIN) tablet 400 mg  400 mg Oral Q6H PRN Vaughan Basta, MD   400 mg at 11/14/15 1245  . lidocaine (LIDODERM) 5 % 1 patch  1 patch Transdermal Q24H Loletha Grayer, MD   1 patch at 11/13/15 1953  . methadone (DOLOPHINE) 10 MG/ML solution 30 mg  30 mg Oral Daily Gonzella Lex, MD   30 mg at 11/14/15 0942  . methocarbamol (ROBAXIN) tablet 750 mg  750 mg Oral TID Gonzella Lex, MD      . multivitamin with minerals tablet 1 tablet  1 tablet Oral Daily Saundra Shelling, MD   1 tablet at 11/14/15 0942  . ondansetron (ZOFRAN) tablet 4 mg  4 mg Oral Q6H PRN Saundra Shelling, MD       Or  . ondansetron (ZOFRAN) injection 4 mg  4 mg Intravenous Q6H PRN Pavan Pyreddy, MD      . potassium chloride SA (K-DUR,KLOR-CON) CR tablet 40 mEq  40 mEq Oral BID Vaughan Basta, MD   40 mEq at 11/13/15 1312  . sodium chloride flush (NS) 0.9 % injection 3 mL  3 mL Intravenous Q12H Saundra Shelling, MD   3 mL at 11/13/15 0952  . vancomycin (VANCOCIN) IVPB 750 mg/150 ml premix  750 mg Intravenous Q12H Nance Pear, MD   750 mg at 11/14/15 1538    Musculoskeletal: Strength & Muscle Tone: within normal limits Gait & Station: unable to stand Patient leans: N/A  Psychiatric Specialty Exam: Physical Exam  Nursing note and vitals reviewed. Constitutional: She appears well-developed and well-nourished.  HENT:  Head: Normocephalic and atraumatic.  Eyes: Conjunctivae are normal. Pupils are equal, round, and reactive to light.  Neck: Normal range of motion.  Cardiovascular: Regular rhythm and normal heart sounds.   Respiratory: Effort normal.  GI: Soft.  Musculoskeletal: Normal range of motion.  Neurological: She is alert.  Skin: Skin is warm and dry.  Psychiatric: Her speech is normal. Her mood appears anxious. Her affect is labile. She is agitated. Cognition and memory are normal. She expresses impulsivity. She expresses no suicidal ideation.    Review of Systems  Constitutional:  Negative.   HENT: Negative.   Eyes: Negative.   Respiratory: Negative.   Cardiovascular: Negative.   Gastrointestinal: Negative.  Negative for nausea and vomiting.  Musculoskeletal: Positive for myalgias, back pain and joint pain.  Skin: Negative.   Neurological: Negative.   Psychiatric/Behavioral: Positive for depression and substance abuse. Negative for suicidal ideas, hallucinations and memory loss. The patient is nervous/anxious and has insomnia.     Blood pressure 101/55, pulse 108, temperature 98.8 F (37.1 C), temperature source Oral, resp. rate 19, height '5\' 2"'$  (1.575 m), weight 54.432 kg (120 lb), last menstrual period 11/13/2015, SpO2 94 %.Body mass index is 21.94 kg/(m^2).  General Appearance: Disheveled  Eye Contact:  Minimal  Speech:  Clear and Coherent  Volume:  Decreased  Mood:  Anxious, Depressed and Irritable  Affect:  Labile  Thought Process:  Goal Directed  Orientation:  Full (Time, Place, and Person)  Thought Content:  Focus constantly on complaining about her pain. No evidence of delusional or psychotic thinking  Suicidal Thoughts:  No  Homicidal Thoughts:  No  Memory:  Immediate;   Good Recent;   Fair Remote;   Poor  Judgement:  Impaired  Insight:  Shallow  Psychomotor Activity:  Restlessness  Concentration:  Concentration: Poor  Recall:  Poor  Fund of Knowledge:  Fair  Language:  Fair  Akathisia:  No  Handed:  Right  AIMS (if indicated):     Assets:  Communication Skills Desire for Improvement  ADL's:  Impaired  Cognition:  WNL  Sleep:        Treatment Plan Summary: Daily contact with patient to assess and evaluate symptoms and progress in treatment, Medication management and Plan Continue the methadone for 1 more day as ordered. It has clearly helped her to feel a little bit better. I have agreed with her to order a muscle relaxer to help probably with opiate withdrawal pain as well as her back discomfort. Robaxin 750 mg 3 times a day.  Additionally I'm adding amitriptyline 25 mg at night to help her with sleep and also possibly with some of the pain. Patient is agreeable to all of this. Encouraged her to stick through the treatment while they get the full diagnosis. No other change to medication at this point.  Disposition: Patient does not meet criteria for psychiatric inpatient admission. Supportive therapy provided about ongoing stressors.  Alethia Berthold, MD 11/14/2015 7:19 PM

## 2015-11-14 NOTE — Progress Notes (Signed)
Per Ronny BaconSarah Wall, Infection Prevention, blood cultures show patient needs to be placed on contact precautions in addition to airborne.

## 2015-11-14 NOTE — Progress Notes (Signed)
*  PRELIMINARY RESULTS* Echocardiogram 2D Echocardiogram has been performed.  Cristela BlueHege, Jeffie Widdowson 11/14/2015, 9:35 AM

## 2015-11-14 NOTE — Progress Notes (Signed)
Lab called and stated that no one came up to draw patient lab because they thought she had a PICC line. I explained to them that patient did not have PICC line and the labs that were ordered was very important. I told them to call the hospitalist on call. Lab called back and stated they had spoke to the hospitalist and he stated to get every thing in the morning.

## 2015-11-14 NOTE — Progress Notes (Signed)
Per Dr. Elisabeth PigeonVachhani, order ibuprofen 400mg  PO q6h PRN for patient.

## 2015-11-14 NOTE — Progress Notes (Signed)
[employer] [facility] [place] [facility]   PATIENT NAME: Andrea King    MR#:  [id number]  DATE OF BIRTH:  Jan 04, 1976  SUBJECTIVE:  CHIEF COMPLAINT:   Chief Complaint  Patient presents with  . Chest Pain    came with pain in chest , lethargy, and chills.  She have Multiple infective foci in both lungs.   c/o pain allover.   REVIEW OF SYSTEMS:  CONSTITUTIONAL: No fever, fatigue or weakness.  EYES: No blurred or double vision.  EARS, NOSE, AND THROAT: No tinnitus or ear pain.  RESPIRATORY: positive cough, shortness of breath, no wheezing or hemoptysis.  CARDIOVASCULAR: complains of chest pain, orthopnea, edema.  GASTROINTESTINAL: No nausea, vomiting, diarrhea or abdominal pain.  GENITOURINARY: No dysuria, hematuria.  ENDOCRINE: No polyuria, nocturia,  HEMATOLOGY: No anemia, easy bruising or bleeding SKIN: No rash or lesion. MUSCULOSKELETAL: No joint pain or arthritis.   NEUROLOGIC: No tingling, numbness, weakness.  PSYCHIATRY: No anxiety or depression.   ROS  DRUG ALLERGIES:  No Known Allergies  VITALS:  Blood pressure 101/55, pulse 108, temperature 98.8 F (37.1 C), temperature source Oral, resp. rate 19, height 5\' 2"  (1.575 m), weight 54.432 kg (120 lb), last menstrual period 11/13/2015, SpO2 94 %.  PHYSICAL EXAMINATION:  GENERAL:  40 y.o.-year-old patient lying in the bed with anxiety and c/o pain.  EYES: Pupils equal, round, reactive to light and accommodation. No scleral icterus. Extraocular muscles intact.  HEENT: Head atraumatic, normocephalic. Oropharynx and nasopharynx clear.  NECK:  Supple, no jugular venous distention. No thyroid enlargement, no tenderness.  LUNGS: Normal breath sounds bilaterally, some wheezing, some crepitation. No use of accessory muscles of respiration.  CARDIOVASCULAR: S1, S2 normal. No murmurs, rubs, or gallops.  ABDOMEN: Soft, nontender, nondistended. Bowel sounds present. No organomegaly or mass.  EXTREMITIES: No pedal  edema, cyanosis, or clubbing. On back- have a pimple which is tender to touch. It is on middle back area. NEUROLOGIC: Cranial nerves II through XII are intact. Muscle strength 5/5 in all extremities. Sensation intact. Gait not checked.  PSYCHIATRIC: The patient is alert and some anxious with pain, but trying to go to sleep inbetween.  SKIN: No obvious rash, lesion, or ulcer.   Physical Exam LABORATORY PANEL:   CBC  Recent Labs Lab 11/14/15 0346  WBC 23.4*  HGB 10.1*  HCT 30.4*  PLT 249   ------------------------------------------------------------------------------------------------------------------  Chemistries   Recent Labs Lab 11/13/15 1341 11/14/15 0346  NA  --  131*  K  --  3.7  CL  --  102  CO2  --  21*  GLUCOSE  --  157*  BUN  --  6  CREATININE  --  0.54  CALCIUM  --  7.8*  MG 2.0  --    ------------------------------------------------------------------------------------------------------------------  Cardiac Enzymes  Recent Labs Lab 11/13/15 0100  TROPONINI <0.03   ------------------------------------------------------------------------------------------------------------------  RADIOLOGY:  Dg Chest 2 View  11/13/2015  CLINICAL DATA:  Chest pain, chronic back pain, and shortness of breath since yesterday. Current smoker. EXAM: CHEST  2 VIEW COMPARISON:  06/29/2014 FINDINGS: New pulmonary nodules are demonstrated in the upper lungs bilaterally, right nodule measuring 15 mm, left apical nodular measuring 9 mm, left mid lung nodule measuring 13 mm, and left upper lung nodule measuring 13 mm. Findings are suspicious for metastatic disease. Previously demonstrated right mid lung nodule is not appreciated today. Emphysematous changes in the lungs with interstitial pattern suggesting fibrosis. No focal consolidation. Normal heart size and pulmonary vascularity. No blunting  of costophrenic angles. No pneumothorax. IMPRESSION: Multiple bilateral pulmonary nodules  appear new since previous study, possibly metastatic. Electronically Signed   By: Burman Nieves M.D.   On: 11/13/2015 01:30   Ct Chest Wo Contrast  11/13/2015  CLINICAL DATA:  Chest pain, chronic back pain, and shortness of breath since yesterday. Current smoker. EXAM: CT CHEST WITHOUT CONTRAST TECHNIQUE: Multidetector CT imaging of the chest was performed following the standard protocol without IV contrast. COMPARISON:  06/27/2014 FINDINGS: Cardiovascular: Normal heart size.  Normal caliber thoracic aorta. Mediastinum/Nodes: No significant lymphadenopathy in the chest. Esophagus is decompressed. Lungs/Pleura: Multiple pulmonary nodules demonstrated throughout both lungs, many with cavitation. Margins are indistinct and some nodules demonstrate ground-glass appearance. Nodules are new since previous study. Appearance is favored to represent infectious or inflammatory process such as multifocal pneumonia, fungal disease, septic emboli, TB, or Wegener's disease. No pleural effusions. No pneumothorax. Upper Abdomen: Visualized upper abdominal organs are unremarkable. Musculoskeletal: No destructive bone lesions. IMPRESSION: Multifocal nodular infiltration throughout both lungs, many with cavitation. Significant progression since previous study. Favor infectious or inflammatory process as described above. Follow-up after treatment is recommended to exclude underlying neoplastic disease. Electronically Signed   By: Burman Nieves M.D.   On: 11/13/2015 06:10   Mr Thoracic Spine Wo Contrast  11/14/2015  CLINICAL DATA:  Elevated white blood cell count and sepsis. Chest and back pain. History of drug abuse. No known injury. Initial encounter. EXAM: MRI THORACIC SPINE WITHOUT CONTRAST TECHNIQUE: Multiplanar, multisequence MR imaging of the thoracic spine was performed. No intravenous contrast was administered. COMPARISON:  CT chest 11/13/2015. FINDINGS: Alignment:  Normal. Vertebrae: Height and signal are  maintained throughout. Small hemangiomas in T5 and T7 are incidentally noted. Cord:  Normal signal throughout. Paraspinal and other soft tissues: Multiple foci of airspace opacity are seen bilaterally. Small appearing pleural effusions identified, greater on the left. Disc levels: Height and hydration are maintained at all levels. The central canal and foramina are widely patent at all levels. IMPRESSION: Normal-appearing thoracic spine. Negative for evidence of discitis or osteomyelitis. Multifocal bilateral airspace disease and small pleural effusions, worse on the left as seen on chest CT scan yesterday. Electronically Signed   By: Drusilla Kanner M.D.   On: 11/14/2015 12:37   Mr Lumbar Spine Wo Contrast  11/14/2015  CLINICAL DATA:  Elevated white blood cell count and sepsis. Chest and back pain. History of drug abuse. No known injury. Initial encounter. EXAM: MRI LUMBAR SPINE WITHOUT CONTRAST TECHNIQUE: Multiplanar, multisequence MR imaging of the lumbar spine was performed. No intravenous contrast was administered. COMPARISON:  CT abdomen and pelvis 04/28/2013. FINDINGS: Segmentation:  Unremarkable. Alignment: Bilateral L5 pars interarticularis defects result in 0.5 cm anterolisthesis L5 on S1. Otherwise unremarkable. Vertebrae:  Height and signal are normal. Conus medullaris: Extends to the T12-L1 level and appears normal. Paraspinal and other soft tissues: Unremarkable. Disc levels: The L1-2 to L4-5 levels are unremarkable. L5-S1: The disc is uncovered without bulging. The central canal is widely patent. The foramina are open. IMPRESSION: No acute abnormality.  Negative for discitis or osteomyelitis. 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 Patent central canal and foramina at all levels. Electronically Signed   By: Drusilla Kanner M.D.   On: 11/14/2015 12:34    ASSESSMENT AND PLAN:   Principal Problem:   Sepsis (HCC) Active Problems:   Community  acquired pneumonia   Pneumonia   Substance induced mood disorder (HCC)   Opiate withdrawal (HCC)  Opiate abuse, continuous  * sepsis   Due to b/l multifocal pneumonia and cavitary lesions.   Likely infective emboli.   IV Abx.   Sputum cultures, AFB collection.   Pulm and ID consult appreciated.   Also do Blood cx, as high risk for endocarditis secondary to IV drug use.   Blood cx positive for staph, may need IV Abx for long time.   Done MRI of thorasic and lumber spine to r/o Osteomyelitis.   * IV drug abuse    She has multi drug positive in urine screen.    Agitated, and asking for pain meds.    Had tachycardia and tachypnea    Likely opioid withdrawal    Called psych consult    Appreciated help - started methadone.  * hypokalemia   Check magnesium, replace orally.  * hyponatremia   IV NS- recheck tomorrow.    All the records are reviewed and case discussed with Care Management/Social Workerr. Management plans discussed with the patient, family and they are in agreement.  CODE STATUS: Full.  TOTAL TIME TAKING CARE OF THIS PATIENT: 35 minutes.   POSSIBLE D/C IN 2-3 DAYS, DEPENDING ON CLINICAL CONDITION.   Altamese DillingVACHHANI, Shalika Arntz M.D on 11/14/2015   Between 7am to 6pm - Pager - [phone]  After 6pm go to [email or web address] - Social research officer, governmentpassword EPAS [facility]  [facility]  Office  [phone]  CC: Primary care physician; No PCP Per Patient  Note: This dictation was prepared with Dragon dictation along with smaller phrase technology. Any transcriptional errors that result from this process are unintentional.

## 2015-11-15 LAB — CULTURE, BLOOD (ROUTINE X 2)

## 2015-11-15 LAB — CBC
HEMATOCRIT: 30.6 % — AB (ref 35.0–47.0)
HEMOGLOBIN: 9.9 g/dL — AB (ref 12.0–16.0)
MCH: 26 pg (ref 26.0–34.0)
MCHC: 32.5 g/dL (ref 32.0–36.0)
MCV: 80.2 fL (ref 80.0–100.0)
Platelets: 306 10*3/uL (ref 150–440)
RBC: 3.82 MIL/uL (ref 3.80–5.20)
RDW: 17.2 % — AB (ref 11.5–14.5)
WBC: 23 10*3/uL — ABNORMAL HIGH (ref 3.6–11.0)

## 2015-11-15 LAB — BASIC METABOLIC PANEL
Anion gap: 8 (ref 5–15)
BUN: 7 mg/dL (ref 6–20)
CHLORIDE: 104 mmol/L (ref 101–111)
CO2: 22 mmol/L (ref 22–32)
CREATININE: 0.33 mg/dL — AB (ref 0.44–1.00)
Calcium: 7.7 mg/dL — ABNORMAL LOW (ref 8.9–10.3)
GFR calc Af Amer: >60 mL/min (ref >60)
GFR calc non Af Amer: >60 mL/min (ref >60)
Glucose, Bld: 109 mg/dL — ABNORMAL HIGH (ref 65–99)
Potassium: 4.3 mmol/L (ref 3.5–5.1)
Sodium: 134 mmol/L — ABNORMAL LOW (ref 135–145)

## 2015-11-15 LAB — VANCOMYCIN, TROUGH: Vancomycin Tr: 4 ug/mL — ABNORMAL LOW (ref 15–20)

## 2015-11-15 LAB — ECHOCARDIOGRAM COMPLETE
Height: 62 in
Weight: 1920 oz

## 2015-11-15 MED ORDER — VANCOMYCIN HCL IN DEXTROSE 1-5 GM/200ML-% IV SOLN
1000.0000 mg | Freq: Three times a day (TID) | INTRAVENOUS | Status: DC
Start: 2015-11-16 — End: 2015-11-17
  Administered 2015-11-16 – 2015-11-17 (×4): 1000 mg via INTRAVENOUS
  Filled 2015-11-15 (×8): qty 200

## 2015-11-15 NOTE — Progress Notes (Signed)
Pharmacy Antibiotic Note  Andrea King is a 40 y.o. female admitted on 11/13/2015 with Bacteremia (staph aureus).  Pharmacy has been consulted for vancomycin.  Patient started on vancomycin 750mg  every 12 hours.   Trough today 4- drawn late  Plan: New Ke: 0.134   T1/2: 5.2  VD: 38  Will start patient on vancomycin 1gm IV every 8 hours. Will draw trough prior to 5 dose. Calculated trough at Css 15. Will monitor changes in renal function.   Height: 5\' 2"  (157.5 cm) Weight: 120 lb (54.432 kg) IBW/kg (Calculated) : 50.1  Temp (24hrs), Avg:98.9 F (37.2 C), Min:97.7 F (36.5 C), Max:100.1 F (37.8 C)   Recent Labs Lab 11/13/15 0100 11/13/15 0333 11/13/15 1341 11/14/15 0346 11/15/15 1551  WBC 27.0*  --   --  23.4* 23.0*  CREATININE 0.67  --   --  0.54 0.33*  LATICACIDVEN  --  1.6 2.2* 2.3*  --   VANCOTROUGH  --   --   --   --  4*    Estimated Creatinine Clearance: 73.2 mL/min (by C-G formula based on Cr of 0.33).    No Known Allergies  Antimicrobials this admission: vancomycin  >>    Dose adjustments this admission:   Microbiology results: 7/12 BCx: Staph  7/12 UCx: pending  7/12 SputumCx: pending  7/12 UA: (-)  Thank you for allowing pharmacy to be a part of this patient's care.  Andrea King, PharmD Clinical Pharmacist 11/15/2015 5:01 PM

## 2015-11-15 NOTE — Progress Notes (Signed)
      INFECTIOUS DISEASE ATTENDING ADDENDUM:   Date: 11/15/2015  Patient name: Andrea King  Medical record number: [id number]  Date of birth: [birth year]         [facility] Antimicrobial Management Team Staphylococcus aureus bacteremia   Staphylococcus aureus bacteremia (SAB) is associated with a high rate of complications and mortality.  Specific aspects of clinical management are critical to optimizing the outcome of patients with SAB.  Therefore, the [facility] Team [facility]) has initiated an intervention aimed at improving the management of SAB at [facility].  To do so, Infectious Diseases physicians are providing an evidence-based consult for the management of all patients with SAB.     Yes No Comments  Perform follow-up blood cultures (even if the patient is afebrile) to ensure clearance of bacteremia [x]  []  SHE NEEDS REPEAT BLOOD CULTURES DRAWN TO PROVE SHE IS CLEARING HER BLOOD STREAM I WILL ORDER NOW  Remove vascular catheter and obtain follow-up blood cultures after the removal of the catheter []  []  DO NOT PLACE PICC LINE UNTIL WE ARE AT 4-5 DAYS FROM TODAYS BLOOD CULTURES AND TODAYS BLOOD CULTURES ARE STILL NO GROWTH    Perform echocardiography to evaluate for endocarditis (transthoracic ECHO is 40-50% sensitive, TEE is > 90% sensitive) []  []  Please keep in mind, that neither test can definitively EXCLUDE endocarditis, and that should clinical suspicion remain high for endocarditis the patient should then still be treated with an "endocarditis" duration of therapy = 6 weeks  I WOULD CONSIDER TEE but even if negative would give her 6 weeks in as controlled a setting as possible    Consult electrophysiologist to evaluate implanted cardiac device (pacemaker, ICD) []  []    Ensure source control []  []  Have all abscesses been drained effectively? Have deep seeded infections (septic joints or osteomyelitis) had appropriate surgical debridement?   She  apparently has lesion on back that needs close monitoring    Investigate for "metastatic" sites of infection []  []  Does the patient have ANY symptom or physical exam finding that would suggest a deeper infection (back or neck pain that may be suggestive of vertebral osteomyelitis or epidural abscess, muscle pain that could be a symptom of pyomyositis)?  Keep in mind that for deep seeded infections MRI imaging with contrast is preferred rather than other often insensitive tests such as plain x-rays, especially early in a patient's presentation.  I would get MRI of the T and L spine WITH CONTRAST given her pain there when possible    Change antibiotic therapy to vancomycin []  []  Beta-lactam antibiotics are preferred for MSSA due to higher cure rates.   If on Vancomycin, goal trough should be 15 - 20 mcg/mL  Estimated duration of IV antibiotic therapy: 6 weeks to 8 weeks but will need to be in a SNF. Could also consider unorthodox ideas such as long acting IV oritavancin vs zyvox but given severity would prefer SNF with IV Vancomycin    []  []  Consult case management for probably prolonged outpatient IV antibiotic therapy        Paulette BlanchCornelius Van Dam 11/15/2015, 5:30 PM

## 2015-11-15 NOTE — Progress Notes (Signed)
MD gave order to discontinue telemetry 

## 2015-11-15 NOTE — Care Management (Signed)
DSS worker Brooke DareRachael Wilson came to see patient. [facility] to discuss case. DSS agent stated that she would be speaking to the patient concerning incident involving her minor child and that she had been trying to contact the patient for some time.

## 2015-11-15 NOTE — Clinical Social Work Note (Signed)
Clinical Social Work Assessment  Patient Details  Name: Andrea King MRN: [id number] Date of Birth: Feb 11, 1976  Date of referral:  11/15/15               Reason for consult:  Discharge Planning                Permission sought to share information with:    Permission granted to share information::     Name::        Agency::     Relationship::     Contact Information:     Housing/Transportation Living arrangements for the past 2 months:  Single Family Home Source of Information:  Patient Patient Interpreter Needed:  None Criminal Activity/Legal Involvement Pertinent to Current Situation/Hospitalization:  No - Comment as needed Significant Relationships:  Other Family Members, Friend Lives with:  Self, Friends Do you feel safe going back to the place where you live?  Yes Need for family participation in patient care:  No (Coment)  Care giving concerns:  Patient does not have insurance and will need IV/ABX for 4-5 weeks. Due to patient's history it's unsafe for her to discharge home to receive treatment.    Social Worker assessment / plan:  CSW met with patient at bedside. Introduced herself and her role. Per patient she currently lives with her uncle. CSW explained to patient that she would need to go to a SNF to receive IV/ABX (Vanc) treatment at discharge. Patient reported she understood. CSW explained that due to her being self-pay she would have to be agreeable to a SNF referral all over [place] in order to obtain a bed. Patient reports she does not mind going outside of [place] to receive treatment. She reports that she has Medicaid and does not understand why she does not have insurance in our system. Patient is agreeable to SNF search in [place]. Patient requested CSW check to see if her Medicaid is active. CSW contacted Aundra Millet- Medicaid Eligibility Specialist and requested she look patient up to determine if she has Medicaid. Per Rose patient's account did not look commons and she  wanted to get another medicaid Eligibility Specialist to help her look at patient's chart. Requested to call CSW back with more details. CSW provide her with best contact number. CSW contacted Zack Shon Baton- Clinical Social Work Chiropodist, Explained patient and her needs. Timothy Lasso has approved of a 30 day LOG for room and board for patient.   FL2/ PASRR completed, placed in MD's basket for signature and faxed to SNFs in [place]. Awaiting bed offers. CSW will continue to follow and assist.   Employment status:  Unemployed Insurance information:  Self Pay (Medicaid Pending) PT Recommendations:  Not assessed at this time Information / Referral to community resources:  Skilled Nursing Facility  Patient/Family's Response to care:  Patient is in agreement to go to SNF to receive 4-5 weeks of IV/ ABX.   Patient/Family's Understanding of and Emotional Response to Diagnosis, Current Treatment, and Prognosis:  Patient reports that she understands that she may have to go outside of [facility] to receive IV/ABX treatment. Patient reports that she understands her diagnosis, current treatment and prognosis.   Emotional Assessment Appearance:  Appears stated age Attitude/Demeanor/Rapport:  Lethargic Affect (typically observed):  Calm Orientation:  Oriented to Self, Oriented to Place, Oriented to  Time, Oriented to Situation Alcohol / Substance use:  Not Applicable Psych involvement (Current and /or in the community):  No (Comment)  Discharge Needs  Concerns  to be addressed:  Discharge Planning Concerns Readmission within the last 30 days:  No Current discharge risk:  Chronically ill Barriers to Discharge:  Continued Medical Work up   [employer], LCSW 11/15/2015, 12:13 PM

## 2015-11-15 NOTE — Progress Notes (Signed)
Per lab tech unable to obtain blood cultures attempted x2.

## 2015-11-15 NOTE — Progress Notes (Signed)
[employer] - [place] at [facility]   PATIENT NAME: Andrea King    MR#:  [id number]  DATE OF BIRTH:  [birth year]  SUBJECTIVE:  CHIEF COMPLAINT:   Chief Complaint  Patient presents with  . Chest Pain    came with pain in chest , lethargy, and chills.  She have Multiple infective foci in both lungs.   c/o pain allover.    Could not give sputum culture. MRI spine is negative for OM.  REVIEW OF SYSTEMS:  CONSTITUTIONAL: No fever, fatigue or weakness.  EYES: No blurred or double vision.  EARS, NOSE, AND THROAT: No tinnitus or ear pain.  RESPIRATORY: positive cough, shortness of breath, no wheezing or hemoptysis.  CARDIOVASCULAR: complains of chest pain, orthopnea, edema.  GASTROINTESTINAL: No nausea, vomiting, diarrhea or abdominal pain.  GENITOURINARY: No dysuria, hematuria.  ENDOCRINE: No polyuria, nocturia,  HEMATOLOGY: No anemia, easy bruising or bleeding SKIN: No rash or lesion. MUSCULOSKELETAL: No joint pain or arthritis.  C/o back pain. NEUROLOGIC: No tingling, numbness, weakness.  PSYCHIATRY: No anxiety or depression.   ROS  DRUG ALLERGIES:  No Known Allergies  VITALS:  Blood pressure 100/60, pulse 96, temperature 98 F (36.7 C), temperature source Oral, resp. rate 19, height  (1.575 m), weight 54.432 kg (120 lb), last menstrual period 11/13/2015, SpO2 95 %.  PHYSICAL EXAMINATION:  GENERAL:  40 y.o.-year-old patient lying in the bed with c/o pain.  EYES: Pupils equal, round, reactive to light and accommodation. No scleral icterus. Extraocular muscles intact.  HEENT: Head atraumatic, normocephalic. Oropharynx and nasopharynx clear.  NECK:  Supple, no jugular venous distention. No thyroid enlargement, no tenderness.  LUNGS: Normal breath sounds bilaterally, some wheezing, some crepitation. No use of accessory muscles of respiration.  CARDIOVASCULAR: S1, S2 normal. No murmurs, rubs, or gallops.  ABDOMEN: Soft, nontender, nondistended. Bowel  sounds present. No organomegaly or mass.  EXTREMITIES: No pedal edema, cyanosis, or clubbing. On back- have a small  ( 1X2 cm) pimple which is tender to touch. It is on middle back area. NEUROLOGIC: Cranial nerves II through XII are intact. Muscle strength 5/5 in all extremities. Sensation intact. Gait not checked.  PSYCHIATRIC: The patient is alert and some anxious with pain. SKIN: No obvious rash, lesion, or ulcer.   Physical Exam LABORATORY PANEL:   CBC  Recent Labs Lab 11/14/15 0346  WBC 23.4*  HGB 10.1*  HCT 30.4*  PLT 249   ------------------------------------------------------------------------------------------------------------------  Chemistries   Recent Labs Lab 11/13/15 1341 11/14/15 0346  NA  --  131*  K  --  3.7  CL  --  102  CO2  --  21*  GLUCOSE  --  157*  BUN  --  6  CREATININE  --  0.54  CALCIUM  --  7.8*  MG 2.0  --    ------------------------------------------------------------------------------------------------------------------  Cardiac Enzymes  Recent Labs Lab 11/13/15 0100  TROPONINI <0.03   ------------------------------------------------------------------------------------------------------------------  RADIOLOGY:  Mr Thoracic Spine Wo Contrast  11/14/2015  CLINICAL DATA:  Elevated white blood cell count and sepsis. Chest and back pain. History of drug abuse. No known injury. Initial encounter. EXAM: MRI THORACIC SPINE WITHOUT CONTRAST TECHNIQUE: Multiplanar, multisequence MR imaging of the thoracic spine was performed. No intravenous contrast was administered. COMPARISON:  CT chest 11/13/2015. FINDINGS: Alignment:  Normal. Vertebrae: Height and signal are maintained throughout. Small hemangiomas in T5 and T7 are incidentally noted. Cord:  Normal signal throughout. Paraspinal and other soft tissues: Multiple foci of airspace opacity are seen  bilaterally. Small appearing pleural effusions identified, greater on the left. Disc levels: Height  and hydration are maintained at all levels. The central canal and foramina are widely patent at all levels. IMPRESSION: Normal-appearing thoracic spine. Negative for evidence of discitis or osteomyelitis. Multifocal bilateral airspace disease and small pleural effusions, worse on the left as seen on chest CT scan yesterday. Electronically Signed   By: Drusilla Kanner M.D.   On: 11/14/2015 12:37   Mr Lumbar Spine Wo Contrast  11/14/2015  CLINICAL DATA:  Elevated white blood cell count and sepsis. Chest and back pain. History of drug abuse. No known injury. Initial encounter. EXAM: MRI LUMBAR SPINE WITHOUT CONTRAST TECHNIQUE: Multiplanar, multisequence MR imaging of the lumbar spine was performed. No intravenous contrast was administered. COMPARISON:  CT abdomen and pelvis 04/28/2013. FINDINGS: Segmentation:  Unremarkable. Alignment: Bilateral L5 pars interarticularis defects result in 0.5 cm anterolisthesis L5 on S1. Otherwise unremarkable. Vertebrae:  Height and signal are normal. Conus medullaris: Extends to the T12-L1 level and appears normal. Paraspinal and other soft tissues: Unremarkable. Disc levels: The L1-2 to L4-5 levels are unremarkable. L5-S1: The disc is uncovered without bulging. The central canal is widely patent. The foramina are open. IMPRESSION: No acute abnormality.  Negative for discitis or osteomyelitis. 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 Patent central canal and foramina at all levels. Electronically Signed   By: Drusilla Kanner M.D.   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   Likely infective emboli.   IV Abx.   Sputum  cultures, AFB collection could not be done, respiratory therapist tried.   IDconsult appreciated.   Also do Blood cx, as high risk for endocarditis secondary to IV drug use.   Blood cx positive for MRSA, may need IV Abx for long time.   Nege MRI of thorasic and lumber spine to r/o Osteomyelitis.   I spoke to oncall ID at [employer]- 714/17- he suggested as it is highly suggestive of infective Endocarditis, and not having sputum samples- WE CAN D?C AIR BORNE ISOLATION, and NO NEED TO GET TB WORK UPS.   As MRSA bacteremia- NO PICC LINE Until blood cx is negative for 3-4 days. Repeat cx sent- negative for 1 day.  Due to IV drug abuse- she will need to go to rehab for Long course of IV Abx.   * IV drug abuse    She has multi drug positive in urine screen.    Agitated, and asking for pain meds.    Had tachycardia and tachypnea    Likely opioid withdrawal    Called psych consult    Appreciated help - started methadone.   Also giving amitriptyline 25 mg HS.  * hypokalemia   Check magnesium, replace orally.  * hyponatremia   IV NS- recheck tomorrow.   All the records are reviewed and case discussed with Care Management/Social Workerr. Management plans discussed with the patient, family and they are in agreement.  CODE STATUS: Full.  TOTAL TIME TAKING CARE OF THIS PATIENT: 35 minutes.   Spoke to ID on phone.  POSSIBLE D/C IN 2-3 DAYS, DEPENDING ON CLINICAL CONDITION.   Nadim Malia, Heath Gold  M.D on 11/15/2015   Between 7am to 6pm - Pager - [phone]  After 6pm go to [email or web address] - Social research officer, governmentpassword [email or web address]  [facility] [place] [facility]  Office  [phone]  CC: Primary care physician; No PCP Per Patient  Note: This dictation was prepared with Dragon dictation along with smaller phrase technology. Any transcriptional errors that result from this process are unintentional.
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
## 2015-11-15 NOTE — [place] FL2 (Signed)
[place] MEDICAID FL2 LEVEL OF CARE SCREENING TOOL     IDENTIFICATION  Patient Name: Andrea King Birthdate: 1975-12-01 Sex: female Admission Date (Current Location): 11/13/2015  [place]   Facility and Address:  [facility], [address], [place], [place] [address]      Provider Number: [id number]  Attending Physician Name and Address:  Altamese DillingVaibhavkumar Truett Mcfarlan, MD  Relative Name and Phone Number:       Current Level of Care: Hospital Recommended Level of Care: Skilled Nursing Facility Prior Approval Number:    Date Approved/Denied:   PASRR Number:  [id number])  Discharge Plan: SNF    Current Diagnoses: Patient Active Problem List   Diagnosis Date Noted  . Sepsis (HCC) 11/13/2015  . Community acquired pneumonia 11/13/2015  . Pneumonia 11/13/2015  . Substance induced mood disorder (HCC) 11/13/2015  . Opiate withdrawal (HCC) 11/13/2015  . Opiate abuse, continuous 11/13/2015  . Incarcerated femoral hernia left 04/28/2013    Orientation RESPIRATION BLADDER Height & Weight     Self, Time, Situation, Place  Normal Continent Weight: 120 lb (54.432 kg) Height:  5\' 2"  (157.5 cm)  BEHAVIORAL SYMPTOMS/MOOD NEUROLOGICAL BOWEL NUTRITION STATUS   (none )  (none ) Continent Diet (Regular Diet )  AMBULATORY STATUS COMMUNICATION OF NEEDS Skin   Supervision Verbally Normal                       Personal Care Assistance Level of Assistance  Bathing, Feeding, Dressing Bathing Assistance: Limited assistance Feeding assistance: Independent Dressing Assistance: Limited assistance     Functional Limitations Info  Sight, Hearing, Speech Sight Info: Adequate Hearing Info: Adequate Speech Info: Adequate    SPECIAL CARE FACTORS FREQUENCY   (IV Abx 4-5 weeks)                    Contractures      Additional Factors Info  Code Status, Allergies, Isolation Precautions Code Status Info:  (Full Code.  ) Allergies Info:  (No Known Allergies. )     Isolation Precautions Info:  (MRSA )     Current Medications (11/15/2015):  This is the current hospital active medication list Current Facility-Administered Medications  Medication Dose Route Frequency Provider Last Rate Last Dose  . 0.9 %  sodium chloride infusion   Intravenous Continuous Ihor AustinPavan Pyreddy, MD 125 mL/hr at 11/15/15 0108    . acetaminophen (TYLENOL) tablet 650 mg  650 mg Oral Q6H PRN Ihor AustinPavan Pyreddy, MD   650 mg at 11/15/15 0849   Or  . acetaminophen (TYLENOL) suppository 650 mg  650 mg Rectal Q6H PRN Ihor AustinPavan Pyreddy, MD      . amitriptyline (ELAVIL) tablet 25 mg  25 mg Oral QHS Audery AmelJohn T Clapacs, MD   25 mg at 11/14/15 1959  . docusate sodium (COLACE) capsule 100 mg  100 mg Oral BID Ihor AustinPavan Pyreddy, MD   100 mg at 11/15/15 0850  . enoxaparin (LOVENOX) injection 40 mg  40 mg Subcutaneous Q24H Ihor AustinPavan Pyreddy, MD   40 mg at 11/15/15 0849  . ibuprofen (ADVIL,MOTRIN) tablet 400 mg  400 mg Oral Q6H PRN Altamese DillingVaibhavkumar Kdyn Vonbehren, MD   400 mg at 11/15/15 0244  . lidocaine (LIDODERM) 5 % 1 patch  1 patch Transdermal Q24H Alford Highlandichard Wieting, MD   1 patch at 11/14/15 2005  . methadone (DOLOPHINE) 10 MG/ML solution 30 mg  30 mg Oral Daily Audery AmelJohn T Clapacs, MD   30 mg at  11/14/15 0942  . methocarbamol (ROBAXIN) tablet 750 mg  750 mg Oral TID Audery Amel, MD   750 mg at 11/15/15 0849  . multivitamin with minerals tablet 1 tablet  1 tablet Oral Daily Ihor Austin, MD   1 tablet at 11/15/15 0849  . ondansetron (ZOFRAN) tablet 4 mg  4 mg Oral Q6H PRN Ihor Austin, MD       Or  . ondansetron (ZOFRAN) injection 4 mg  4 mg Intravenous Q6H PRN Pavan Pyreddy, MD      . potassium chloride SA (K-DUR,KLOR-CON) CR tablet 40 mEq  40 mEq Oral BID Altamese Dilling, MD   40 mEq at 11/15/15 0850  . sodium chloride flush (NS) 0.9 % injection 3 mL  3 mL Intravenous Q12H Ihor Austin, MD   3 mL at 11/13/15 0952  . vancomycin (VANCOCIN) IVPB 750 mg/150 ml premix  750 mg  Intravenous Q12H Phineas Semen, MD   750 mg at 11/15/15 0236     Discharge Medications: Please see discharge summary for a list of discharge medications.  Relevant Imaging Results:  Relevant Lab Results:   Additional Information  (SSN: [id number])  Sample, Ladon Applebaum, LCSW

## 2015-11-16 LAB — BASIC METABOLIC PANEL
Anion gap: 11 (ref 5–15)
CO2: 22 mmol/L (ref 22–32)
CREATININE: 0.4 mg/dL — AB (ref 0.44–1.00)
Calcium: 8.4 mg/dL — ABNORMAL LOW (ref 8.9–10.3)
Chloride: 101 mmol/L (ref 101–111)
Glucose, Bld: 85 mg/dL (ref 65–99)
Potassium: 4.2 mmol/L (ref 3.5–5.1)
SODIUM: 134 mmol/L — AB (ref 135–145)

## 2015-11-16 MED ORDER — HYDROCODONE-ACETAMINOPHEN 5-325 MG PO TABS
1.0000 | ORAL_TABLET | ORAL | Status: DC | PRN
Start: 2015-11-16 — End: 2015-11-17
  Administered 2015-11-16 – 2015-11-17 (×5): 1 via ORAL
  Filled 2015-11-16 (×5): qty 1

## 2015-11-16 NOTE — Progress Notes (Signed)
[employer] - [place] [facility]   PATIENT NAME: Andrea King    MR#:  [id number]  DATE OF BIRTH:  1975/09/03  SUBJECTIVE:  CHIEF COMPLAINT:   Chief Complaint  Patient presents with  . Chest Pain   Still c/o lots of rib and back pain, requesting stronger pain meds. REVIEW OF SYSTEMS:   Review of Systems  Constitutional: Negative for fever, weight loss, malaise/fatigue and diaphoresis.  HENT: Negative for ear discharge, ear pain, hearing loss, nosebleeds, sore throat and tinnitus.   Eyes: Negative for blurred vision and pain.  Respiratory: Negative for cough, hemoptysis, shortness of breath and wheezing.   Cardiovascular: Positive for chest pain (rib pain). Negative for palpitations, orthopnea and leg swelling.  Gastrointestinal: Negative for heartburn, nausea, vomiting, abdominal pain, diarrhea, constipation and blood in stool.  Genitourinary: Negative for dysuria, urgency and frequency.  Musculoskeletal: Positive for back pain. Negative for myalgias.  Skin: Negative for itching and rash.  Neurological: Negative for dizziness, tingling, tremors, focal weakness, seizures, weakness and headaches.  Psychiatric/Behavioral: Negative for depression. The patient is not nervous/anxious.     DRUG ALLERGIES:  No Known Allergies  VITALS:  Blood pressure 105/60, pulse 92, temperature 100.2 F (37.9 C), temperature source Oral, resp. rate 18, height  (1.575 m), weight 54.432 kg (120 lb), last menstrual period 11/13/2015, SpO2 100 %.  PHYSICAL EXAMINATION:  GENERAL:  40 y.o.-year-old patient lying in the bed with c/o pain.  EYES: Pupils equal, round, reactive to light and accommodation. No scleral icterus. Extraocular muscles intact.  HEENT: Head atraumatic, normocephalic. Oropharynx and nasopharynx clear.  NECK:  Supple, no jugular venous distention. No thyroid enlargement, no tenderness.  LUNGS: Normal breath sounds bilaterally, some wheezing, some crepitation.  No use of accessory muscles of respiration.  CARDIOVASCULAR: S1, S2 normal. No murmurs, rubs, or gallops.  ABDOMEN: Soft, nontender, nondistended. Bowel sounds present. No organomegaly or mass.  EXTREMITIES: No pedal edema, cyanosis, or clubbing. On back- have a small  ( 1X2 cm) pimple which is tender to touch. It is on middle back area. NEUROLOGIC: Cranial nerves II through XII are intact. Muscle strength 5/5 in all extremities. Sensation intact. Gait not checked.  PSYCHIATRIC: The patient is alert and some anxious with pain. SKIN: No obvious rash, lesion, or ulcer.   Physical Exam LABORATORY PANEL:   CBC  Recent Labs Lab 11/15/15 1551  WBC 23.0*  HGB 9.9*  HCT 30.6*  PLT 306   ------------------------------------------------------------------------------------------------------------------  Chemistries   Recent Labs Lab 11/13/15 1341  11/15/15 1551  NA  --   < > 134*  K  --   < > 4.3  CL  --   < > 104  CO2  --   < > 22  GLUCOSE  --   < > 109*  BUN  --   < > 7  CREATININE  --   < > 0.33*  CALCIUM  --   < > 7.7*  MG 2.0  --   --   < > = values in this interval not displayed. 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 Likely infective emboli. - continue IV Vanco - ID consult appreciated.  Also do Blood cx, as high risk for endocarditis secondary to IV drug use.   Blood cx positive for MRSA, may need IV Abx for long time.   Neg MRI of thorasic and lumber spine to r/o Osteomyelitis. - As MRSA bacteremia- NO PICC LINE Until blood cx is negative for 3-4 days.  Repeat cx sent on 7/12 - negative for 2 day.  Due to IV drug abuse- she will need to go to rehab for Long course of IV Abx. - will get PICC line on Monday if blood c/s remain neg   * IV drug abuse    She has multi drug positive in urine screen.    Agitated, and asking for pain meds.    Had tachycardia and tachypnea    Likely opioid withdrawal    Appreciate psych input - gave methadone for 1 day but now off, will try norco for pain control   Also started amitriptyline 25 mg HS.  * Hypokalemia    Replete and resolved  * hyponatremia   IV NS- recheck tomorrow.   All the records are reviewed and case discussed with Care Management/Social Worker. Management plans discussed with the patient, nursing and they are in agreement.  CODE STATUS: Full.  TOTAL TIME TAKING CARE OF THIS PATIENT: 35 minutes.    POSSIBLE D/C IN 2-3 DAYS, DEPENDING ON CLINICAL CONDITION.   [facility], Meeyah Ovitt M.D on 11/16/2015   Between 7am to 6pm - Pager - [phone]  After 6pm go to [email or web address] - password [email or web address]  [facility] [place] [facility]  Office  [phone]  CC: Primary care physician; Alberteen SpindleSCIORA, [name], CNM  Note: This dictation was prepared with Dragon dictation along with smaller phrase technology. Any transcriptional errors that result from this process are unintentional.

## 2015-11-16 NOTE — Progress Notes (Signed)
      INFECTIOUS DISEASE ATTENDING ADDENDUM:   Date: 11/16/2015  Patient name: Viann FishRaina L Spengler  Medical record number: [id number]  Date of birth: 22-Mar-1976         [facility] Management Team Staphylococcus aureus bacteremia   Staphylococcus aureus bacteremia (SAB) is associated with a high rate of complications and mortality.  Specific aspects of clinical management are critical to optimizing the outcome of patients with SAB.  Therefore, the [facility] Antimicrobial Management Team [facility]) has initiated an intervention aimed at improving the management of SAB at [facility].  To do so, Infectious Diseases physicians are providing an evidence-based consult for the management of all patients with SAB.     Yes No Comments  Perform follow-up blood cultures (even if the patient is afebrile) to ensure clearance of bacteremia [x]  []  NOTE THE ONE NEGATIVE BLOOD CULTURE FROM July 12TH DOES NOT COUNT AS THERE WAS A POSITIVE BLOOD CULTURE FROM THE SAME DAY  THE CLOCK FOR A NEGATIVE BLOOD CULTURE STARTS YESTERDAY WHEN A REPEAT CULTURE WAS SENT  Remove vascular catheter and obtain follow-up blood cultures after the removal of the catheter []  []  DO NOT PLACE PICC LINE UNTIL WE ARE AT 4-5 DAYS FROM YESTERDAY'S BLOOD CULTURES PROVIDED THERE IS  STILL NO GROWTH    Perform echocardiography to evaluate for endocarditis (transthoracic ECHO is 40-50% sensitive, TEE is > 90% sensitive) []  []  Please keep in mind, that neither test can definitively EXCLUDE endocarditis, and that should clinical suspicion remain high for endocarditis the patient should then still be treated with an "endocarditis" duration of therapy = 6 weeks  I WOULD CONSIDER TEE but even if negative would give her 6 weeks in as controlled a setting as possible    Consult electrophysiologist to evaluate implanted cardiac device (pacemaker, ICD) []  []    Ensure source control []  []  Have all abscesses been drained  effectively? Have deep seeded infections (septic joints or osteomyelitis) had appropriate surgical debridement?   She apparently has lesion on back that needs close monitoring    Investigate for "metastatic" sites of infection []  []  Does the patient have ANY symptom or physical exam finding that would suggest a deeper infection (back or neck pain that may be suggestive of vertebral osteomyelitis or epidural abscess, muscle pain that could be a symptom of pyomyositis)?  Keep in mind that for deep seeded infections MRI imaging with contrast is preferred rather than other often insensitive tests such as plain x-rays, especially early in a patient's presentation.  IMRI of the T and L spine  NOT SHOWING INFECTIOUS PATHOLOGY   Change antibiotic therapy to vancomycin []  []  Beta-lactam antibiotics are preferred for MSSA due to higher cure rates.   If on Vancomycin, goal trough should be 15 - 20 mcg/mL  Estimated duration of IV antibiotic therapy: 6 weeks to 8 weeks but will need to be in a SNF. Could also consider unorthodox ideas such as long acting IV oritavancin vs zyvox but given severity would prefer SNF with IV Vancomycin    []  []  Consult case management for probably prolonged outpatient IV antibiotic therapy        Paulette BlanchCornelius Van Dam 11/16/2015, 6:16 PM

## 2015-11-17 LAB — CBC
HEMATOCRIT: 29.2 % — AB (ref 35.0–47.0)
HEMOGLOBIN: 9.7 g/dL — AB (ref 12.0–16.0)
MCH: 26.2 pg (ref 26.0–34.0)
MCHC: 33.2 g/dL (ref 32.0–36.0)
MCV: 78.8 fL — AB (ref 80.0–100.0)
Platelets: 436 10*3/uL (ref 150–440)
RBC: 3.71 MIL/uL — AB (ref 3.80–5.20)
RDW: 16.9 % — ABNORMAL HIGH (ref 11.5–14.5)
WBC: 23.9 10*3/uL — AB (ref 3.6–11.0)

## 2015-11-17 LAB — BASIC METABOLIC PANEL
ANION GAP: 7 (ref 5–15)
BUN: <5 mg/dL — ABNORMAL LOW (ref 6–20)
CALCIUM: 7.5 mg/dL — AB (ref 8.9–10.3)
CO2: 25 mmol/L (ref 22–32)
Chloride: 102 mmol/L (ref 101–111)
Creatinine, Ser: 0.45 mg/dL (ref 0.44–1.00)
GFR calc non Af Amer: >60 mL/min (ref >60)
GLUCOSE: 91 mg/dL (ref 65–99)
POTASSIUM: 3.5 mmol/L (ref 3.5–5.1)
Sodium: 134 mmol/L — ABNORMAL LOW (ref 135–145)

## 2015-11-17 LAB — VANCOMYCIN, TROUGH: Vancomycin Tr: 13 ug/mL — ABNORMAL LOW (ref 15–20)

## 2015-11-17 MED ORDER — OXYCODONE HCL 5 MG PO TABS
5.0000 mg | ORAL_TABLET | Freq: Four times a day (QID) | ORAL | Status: DC | PRN
  Administered 2015-11-19 – 2015-11-20 (×5): 5 mg via ORAL
  Filled 2015-11-17 (×5): qty 1

## 2015-11-17 MED ORDER — HYDROCODONE-ACETAMINOPHEN 5-325 MG PO TABS
2.0000 | ORAL_TABLET | ORAL | Status: DC | PRN
  Administered 2015-11-17 – 2015-11-20 (×13): 2 via ORAL
  Filled 2015-11-17 (×15): qty 2

## 2015-11-17 MED ORDER — VANCOMYCIN HCL 10 G IV SOLR
1250.0000 mg | Freq: Three times a day (TID) | INTRAVENOUS | Status: DC
  Administered 2015-11-17 – 2015-11-20 (×10): 1250 mg via INTRAVENOUS
  Filled 2015-11-17 (×12): qty 1250

## 2015-11-17 NOTE — Progress Notes (Signed)
[employer] [facility] [place] [facility]   PATIENT NAME: Andrea MannsRaina King    MR#:  [id number]  DATE OF BIRTH:  08-01-1975  SUBJECTIVE:  CHIEF COMPLAINT:   Chief Complaint  Patient presents with  . Chest Pain  Still c/o lots of rib and back pain, requesting stronger pain meds. REVIEW OF SYSTEMS:   Review of Systems  Constitutional: Negative for fever, weight loss, malaise/fatigue and diaphoresis.  HENT: Negative for ear discharge, ear pain, hearing loss, nosebleeds, sore throat and tinnitus.   Eyes: Negative for blurred vision and pain.  Respiratory: Negative for cough, hemoptysis, shortness of breath and wheezing.   Cardiovascular: Positive for chest pain (rib pain). Negative for palpitations, orthopnea and leg swelling.  Gastrointestinal: Negative for heartburn, nausea, vomiting, abdominal pain, diarrhea, constipation and blood in stool.  Genitourinary: Negative for dysuria, urgency and frequency.  Musculoskeletal: Positive for back pain. Negative for myalgias.  Skin: Negative for itching and rash.  Neurological: Negative for dizziness, tingling, tremors, focal weakness, seizures, weakness and headaches.  Psychiatric/Behavioral: Negative for depression. The patient is not nervous/anxious.     DRUG ALLERGIES:  No Known Allergies  VITALS:  Blood pressure 116/60, pulse 91, temperature 98.4 F (36.9 C), temperature source Oral, resp. rate 18, height 5\' 2"  (1.575 m), weight 54.432 kg (120 lb), last menstrual period 11/13/2015, SpO2 91 %.  PHYSICAL EXAMINATION:  GENERAL:  40 y.o.-year-old patient lying in the bed with c/o pain.  EYES: Pupils equal, round, reactive to light and accommodation. No scleral icterus. Extraocular muscles intact.  HEENT: Head atraumatic, normocephalic. Oropharynx and nasopharynx clear.  NECK:  Supple, no jugular venous distention. No thyroid enlargement, no tenderness.  LUNGS: Normal breath sounds bilaterally, some wheezing, some crepitation. No  use of accessory muscles of respiration.  CARDIOVASCULAR: S1, S2 normal. No murmurs, rubs, or gallops.  ABDOMEN: Soft, nontender, nondistended. Bowel sounds present. No organomegaly or mass.  EXTREMITIES: No pedal edema, cyanosis, or clubbing. On back- have a small  ( 1X2 cm) pimple which is tender to touch. It is on middle back area. NEUROLOGIC: Cranial nerves II through XII are intact. Muscle strength 5/5 in all extremities. Sensation intact. Gait not checked.  PSYCHIATRIC: The patient is alert and some anxious with pain. SKIN: No obvious rash, lesion, or ulcer.   Physical Exam LABORATORY PANEL:   CBC  Recent Labs Lab 11/17/15 0743  WBC 23.9*  HGB 9.7*  HCT 29.2*  PLT 436   ------------------------------------------------------------------------------------------------------------------  Chemistries   Recent Labs Lab 11/13/15 1341  11/17/15 0743  NA  --   < > 134*  K  --   < > 3.5  CL  --   < > 102  CO2  --   < > 25  GLUCOSE  --   < > 91  BUN  --   < > <5*  CREATININE  --   < > 0.45  CALCIUM  --   < > 7.5*  MG 2.0  --   --   < > = values in this interval not displayed. ------------------------------------------------------------------------------------------------------------------  ASSESSMENT AND PLAN:  40 y.o. female with a known history of Hepatitis C, bronchial asthma, substance abuse, anemia admitted with chest discomfort and difficulty breathing.  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 Likely infective emboli. - continue IV Vanco - ID consult appreciated.   Also do Blood cx, as high risk for endocarditis secondary to IV drug  use.   Blood cx positive for MRSA, may need IV Abx for long time.   Neg MRI of thorasic and lumber spine to r/o Osteomyelitis. - As MRSA bacteremia- NO PICC LINE Until blood cx is negative for 4-5 days. Repeat cx sent on 7/12 - 1 of them was positive.   so this one does not count.  The clock for negative blood culture Started on 14th of July and will need to remain negative for at least 4-5 days after which PICC line can be placed per infectious disease - agree  Due to IV drug abuse- she will need to go to rehab for Long course of IV Abx. - will get PICC line on Tuesday/Wednesday if blood c/s remain neg from 14th of July   * IV drug abuse    She has multi drug positive in urine screen.    Agitated, and asking for pain meds.    Had tachycardia and tachypnea    Likely opioid withdrawal    We will increase the dose of Norco and add oxycodone for better pain control  * Hypokalemia    Replete and resolved  * hyponatremia   Resolved with IV hydration   All the records are reviewed and case discussed with Care Management/Social Worker. Management plans discussed with the patient, nursing and they are in agreement.  CODE STATUS: Full.  TOTAL TIME TAKING CARE OF THIS PATIENT: 35 minutes.    POSSIBLE D/C IN 4-5 DAYS, DEPENDING ON CLINICAL CONDITION.   [facility], Winfield Caba M.D on 11/17/2015   Between 7am to 6pm - Pager - [phone]  After 6pm go to [email or web address] - password [email or web address]  [facility]  Office  [phone]  CC: Primary care physician; Alberteen Spindle, CNM  Note: This dictation was prepared with Dragon dictation along with smaller phrase technology. Any transcriptional errors that result from this process are unintentional.

## 2015-11-17 NOTE — Consult Note (Signed)
[place] Psychiatry Consult   Reason for Consult:  Consult for this 40 year old woman currently in the hospital with probable sepsis related to a pulmonary process. History of opiate abuse. Complaining of pain. Referring Physician:  Anselm Jungling Patient Identification: Andrea King MRN:  [id number] Principal Diagnosis: Opiate withdrawal Diagnosis:   Patient Active Problem List   Diagnosis Date Noted  . Sepsis ([place]) [A41.9] 11/13/2015  . Community acquired pneumonia [J18.9] 11/13/2015  . Pneumonia [J18.9] 11/13/2015  . Substance induced mood disorder ([place]) [F19.94] 11/13/2015  . Opiate withdrawal ([place]) [F11.23] 11/13/2015  . Opiate abuse, continuous [F11.10] 11/13/2015  . Incarcerated femoral hernia left [K41.30] 04/28/2013    Total Time spent with patient: 20 minutes  Subjective:   Andrea King is a 40 y.o. female patient admitted with "I am in pain, can you help me?".  Follow-up for Sunday the 16th. No new complaints. Continues to talk about pain and anxiety. Continues to ask for controlled substances. Affect is dysphoric and blunted but not agitated. No sign of psychosis. No thoughts of suicide. Cooperating well with treatment at this time.  HPI:  Patient interviewed. Chart reviewed including current and old her chart. Labs and vitals reviewed including chest CT scan. 40 year old woman presented to the emergency room complaining of chest and back pain and shortness of breath. Workup shows what appears to be a likely infectious process in the lungs. Further workup still in place. Consult because she is complaining of pain and has a known history of opiate abuse. On interview today I found the patient only partially cooperative. When I saw her through the window and had not come into the room yet she appeared to be lying calmly. Once I came in and started talking to her she became very agitated and started moaning and writhing and shouting about pain. Patient reports that she has  severe pain in her back and ribs which has been present for about a week. She says her mood is bad but relates this to her physical discomfort. Denied any actual suicidal ideation but made comments about how she didn't want to live in her current state anymore. No intent or plan of actually harming herself. Denies having any hallucinations. No evident paranoia. Has not been sleeping or eating well recently. Patient admits that she abuses intravenous heroin but insists to me that the pain she is having couldn't possibly be anything related to withdrawal. She claims she last had some heroin about a week ago. She denied that she was using any other drugs at all. Denied any alcohol use. Told me at one point that she had a prescription for Xanax but later did not mention being on any benzodiazepines. Information from the chart shows that her urine drug screen process today is positive for opiates amphetamines and cocaine and benzodiazepines. Looking at her controlled substance database she has not been receiving any prescriptions for benzodiazepines recently. She had been on Suboxone maintenance treatment but the last prescription was written in May.  Social history: Patient says she lives with an uncle. Not currently working. Says that she feels like she has a safe living situation.  Medical history: Patient has a history of a presentation was similar symptoms and findings in the past but at that time she walked out [place]. She says that she healed up from that and didn't have any further complications. I see that she has had visits with obstetricians and has had children delivered in the not so distant past. No  other ongoing active medical problems.  Substance abuse history: Long history of opiate abuse. She was not very cooperative with giving me details of it. Was in Suboxone treatment during her pregnancy but apparently has not continue to follow-up with that. Opiates appear to be drug of  choice. Unclear how much sobriety she has had.  Past Psychiatric History: Patient told me she thinks that she might of had psychiatric treatment or even hospitalization in the distant past but can't remember what for. We can't find any documentation in our records of other psychiatric treatment. Denies ever trying to kill her self in the past.  Risk to Self: Is patient at risk for suicide?: No Risk to Others:   Prior Inpatient Therapy:   Prior Outpatient Therapy:    Past Medical History:  Past Medical History  Diagnosis Date  . Hepatitis C   . Asthma   . Substance abuse   . Anemia   . DJD (degenerative joint disease)     Past Surgical History  Procedure Laterality Date  . Tumor excision      left scalpula  . Inguinal hernia repair Left 04/28/2013    Procedure: HERNIA REPAIR INGUINAL ADULT with mesh;  Surgeon: Cherylynn Ridges, MD;  Location: [facility] OR;  Service: General;  Laterality: Left;   Family History:  Family History  Problem Relation Age of Onset  . Cancer Mother   . Cancer - Colon Maternal Grandfather   . Diabetes Maternal Grandfather    Family Psychiatric  History: Patient denies any family history of mental health or substance abuse problems Social History:  History  Alcohol Use No     History  Drug Use  . Yes    Comment: cocaine,Heroin    Social History   Social History  . Marital Status: Single    Spouse Name: N/A  . Number of Children: N/A  . Years of Education: N/A   Occupational History  . unemployed    Social History Main Topics  . Smoking status: Current Every Day Smoker    Types: Cigarettes  . Smokeless tobacco: None  . Alcohol Use: No  . Drug Use: Yes     Comment: cocaine,Heroin  . Sexual Activity: Yes    Birth Control/ Protection: None   Other Topics Concern  . None   Social History Narrative   Additional Social History:    Allergies:  No Known Allergies  Labs:  Results for orders placed or performed during the hospital encounter  of 11/13/15 (from the past 48 hour(s))  Culture, blood (Routine X 2) w Reflex to ID Panel     Status: None (Preliminary result)   Collection Time: 11/16/15  2:45 PM  Result Value Ref Range   Specimen Description BLOOD RIGHT ARM    Special Requests      BOTTLES DRAWN AEROBIC AND ANAEROBIC  AEROBIC 1CC, ANAEROBIC .5CC   Culture NO GROWTH < 24 HOURS    Report Status PENDING   Culture, blood (single) w Reflex to ID Panel     Status: None (Preliminary result)   Collection Time: 11/16/15  5:15 PM  Result Value Ref Range   Specimen Description BLOOD RIGHT ARM    Special Requests BOTTLES DRAWN AEROBIC AND ANAEROBIC  1CC    Culture NO GROWTH < 24 HOURS    Report Status PENDING   Basic metabolic panel     Status: Abnormal   Collection Time: 11/16/15  5:16 PM  Result Value Ref Range   Sodium  134 (L) 135 - 145 mmol/L   Potassium 4.2 3.5 - 5.1 mmol/L   Chloride 101 101 - 111 mmol/L   CO2 22 22 - 32 mmol/L   Glucose, Bld 85 65 - 99 mg/dL   BUN <5 (L) 6 - 20 mg/dL   Creatinine, Ser 0.40 (L) 0.44 - 1.00 mg/dL   Calcium 8.4 (L) 8.9 - 10.3 mg/dL   GFR calc non Af Amer >60 >60 mL/min   GFR calc Af Amer >60 >60 mL/min    Comment: (NOTE) The eGFR has been calculated using the CKD EPI equation. This calculation has not been validated in all clinical situations. eGFR's persistently <60 mL/min signify possible Chronic Kidney Disease.    Anion gap 11 5 - 15  CBC     Status: Abnormal   Collection Time: 11/17/15  7:43 AM  Result Value Ref Range   WBC 23.9 (H) 3.6 - 11.0 K/uL   RBC 3.71 (L) 3.80 - 5.20 MIL/uL   Hemoglobin 9.7 (L) 12.0 - 16.0 g/dL   HCT 29.2 (L) 35.0 - 47.0 %   MCV 78.8 (L) 80.0 - 100.0 fL   MCH 26.2 26.0 - 34.0 pg   MCHC 33.2 32.0 - 36.0 g/dL   RDW 16.9 (H) 11.5 - 14.5 %   Platelets 436 150 - 440 K/uL  Basic metabolic panel     Status: Abnormal   Collection Time: 11/17/15  7:43 AM  Result Value Ref Range   Sodium 134 (L) 135 - 145 mmol/L   Potassium 3.5 3.5 - 5.1 mmol/L    Chloride 102 101 - 111 mmol/L   CO2 25 22 - 32 mmol/L   Glucose, Bld 91 65 - 99 mg/dL   BUN <5 (L) 6 - 20 mg/dL   Creatinine, Ser 0.45 0.44 - 1.00 mg/dL   Calcium 7.5 (L) 8.9 - 10.3 mg/dL   GFR calc non Af Amer >60 >60 mL/min   GFR calc Af Amer >60 >60 mL/min    Comment: (NOTE) The eGFR has been calculated using the CKD EPI equation. This calculation has not been validated in all clinical situations. eGFR's persistently <60 mL/min signify possible Chronic Kidney Disease.    Anion gap 7 5 - 15  Vancomycin, trough     Status: Abnormal   Collection Time: 11/17/15  7:43 AM  Result Value Ref Range   Vancomycin Tr 13 (L) 15 - 20 ug/mL    Current Facility-Administered Medications  Medication Dose Route Frequency Provider Last Rate Last Dose  . 0.9 %  sodium chloride infusion   Intravenous Continuous Saundra Shelling, MD 125 mL/hr at 11/17/15 1509    . acetaminophen (TYLENOL) tablet 650 mg  650 mg Oral Q6H PRN Saundra Shelling, MD   650 mg at 11/15/15 1811   Or  . acetaminophen (TYLENOL) suppository 650 mg  650 mg Rectal Q6H PRN Saundra Shelling, MD      . amitriptyline (ELAVIL) tablet 25 mg  25 mg Oral QHS Gonzella Lex, MD   25 mg at 11/16/15 2217  . docusate sodium (COLACE) capsule 100 mg  100 mg Oral BID Saundra Shelling, MD   100 mg at 11/17/15 0919  . enoxaparin (LOVENOX) injection 40 mg  40 mg Subcutaneous Q24H Saundra Shelling, MD   40 mg at 11/13/15 1130  . HYDROcodone-acetaminophen (NORCO/VICODIN) 5-325 MG per tablet 2 tablet  2 tablet Oral Q4H PRN Max Sane, MD   2 tablet at 11/17/15 1509  . ibuprofen (ADVIL,MOTRIN) tablet 400 mg  400 mg Oral Q6H PRN Vaughan Basta, MD   400 mg at 11/17/15 1740  . lidocaine (LIDODERM) 5 % 1 patch  1 patch Transdermal Q24H Loletha Grayer, MD   1 patch at 11/16/15 1739  . methocarbamol (ROBAXIN) tablet 750 mg  750 mg Oral TID Gonzella Lex, MD   750 mg at 11/17/15 1509  . multivitamin with minerals tablet 1 tablet  1 tablet Oral Daily Saundra Shelling, MD    1 tablet at 11/17/15 0919  . ondansetron (ZOFRAN) tablet 4 mg  4 mg Oral Q6H PRN Saundra Shelling, MD       Or  . ondansetron (ZOFRAN) injection 4 mg  4 mg Intravenous Q6H PRN Pavan Pyreddy, MD      . oxyCODONE (Oxy IR/ROXICODONE) immediate release tablet 5 mg  5 mg Oral Q6H PRN Vipul Shah, MD      . potassium chloride SA (K-DUR,KLOR-CON) CR tablet 40 mEq  40 mEq Oral BID Vaughan Basta, MD   40 mEq at 11/13/15 1312  . sodium chloride flush (NS) 0.9 % injection 3 mL  3 mL Intravenous Q12H Saundra Shelling, MD   3 mL at 11/13/15 0952  . vancomycin (VANCOCIN) 1,250 mg in sodium chloride 0.9 % 250 mL IVPB  1,250 mg Intravenous Q8H Vipul Shah, MD   1,250 mg at 11/17/15 1636    Musculoskeletal: Strength & Muscle Tone: within normal limits Gait & Station: unable to stand Patient leans: N/A  Psychiatric Specialty Exam: Physical Exam  Nursing note and vitals reviewed. Constitutional: She appears well-developed and well-nourished.  HENT:  Head: Normocephalic and atraumatic.  Eyes: Conjunctivae are normal. Pupils are equal, round, and reactive to light.  Neck: Normal range of motion.  Cardiovascular: Regular rhythm and normal heart sounds.   Respiratory: Effort normal.  GI: Soft.  Musculoskeletal: Normal range of motion.  Neurological: She is alert.  Skin: Skin is warm and dry.  Psychiatric: Her speech is normal. Her mood appears anxious. Her affect is labile. She is agitated. Cognition and memory are normal. She expresses impulsivity. She expresses no suicidal ideation.    Review of Systems  Constitutional: Negative.   HENT: Negative.   Eyes: Negative.   Respiratory: Negative.   Cardiovascular: Negative.   Gastrointestinal: Negative.  Negative for nausea and vomiting.  Musculoskeletal: Positive for myalgias, back pain and joint pain.  Skin: Negative.   Neurological: Negative.   Psychiatric/Behavioral: Positive for depression and substance abuse. Negative for suicidal ideas,  hallucinations and memory loss. The patient is nervous/anxious and has insomnia.     Blood pressure 120/64, pulse 97, temperature 99.9 F (37.7 C), temperature source Oral, resp. rate 18, height '5\' 2"'$  (1.575 m), weight 54.432 kg (120 lb), last menstrual period 11/13/2015, SpO2 93 %.Body mass index is 21.94 kg/(m^2).  General Appearance: Disheveled  Eye Contact:  Minimal  Speech:  Clear and Coherent  Volume:  Decreased  Mood:  Anxious, Depressed and Irritable  Affect:  Labile  Thought Process:  Goal Directed  Orientation:  Full (Time, Place, and Person)  Thought Content:  Focus constantly on complaining about her pain. No evidence of delusional or psychotic thinking  Suicidal Thoughts:  No  Homicidal Thoughts:  No  Memory:  Immediate;   Good Recent;   Fair Remote;   Poor  Judgement:  Impaired  Insight:  Shallow  Psychomotor Activity:  Restlessness  Concentration:  Concentration: Poor  Recall:  Poor  Fund of Knowledge:  Fair  Language:  Fair  Akathisia:  No  Handed:  Right  AIMS (if indicated):     Assets:  Communication Skills Desire for Improvement  ADL's:  Impaired  Cognition:  WNL  Sleep:        Treatment Plan Summary: Daily contact with patient to assess and evaluate symptoms and progress in treatment, Medication management and Plan Patient appears to be stable. Continues to complain of back pain. Seems to be resting in at least; for. Chart reviewed. It looks like she's been a need several weeks of IV antibiotics and may need transfer to skilled nursing. No indication to add or change any psychiatric medicine.  Disposition: Patient does not meet criteria for psychiatric inpatient admission. Supportive therapy provided about ongoing stressors.  Alethia Berthold, MD 11/17/2015 5:42 PM

## 2015-11-17 NOTE — Progress Notes (Signed)
      INFECTIOUS DISEASE ATTENDING ADDENDUM:   Date: 11/17/2015  Patient name: Viann FishRaina L Ballinger  Medical record number: [id number]  Date of birth: 1975-10-07         [facility] Ant[facility]icrobial Management Team Staphylococcus aureus bacteremia   Staphylococcus aureus bacteremia (SAB) is associated with a high rate of complications and mortality.  Specific aspects of clinical management are critical to optimizing the outcome of patients with SAB.  Therefore, the [facility] Antimicrobial Management Team [facility]) has initiated an intervention aimed at improving the management of SAB at [facility].  To do so, Infectious Diseases physicians are providing an evidence-based consult for the management of all patients with SAB.     Yes No Comments  Perform follow-up blood cultures (even if the patient is afebrile) to ensure clearance of bacteremia [x]  []  NOTE THETHE CLOCK FOR A NEGATIVE BLOOD CULTURE STARTS 11/15/15  WHEN A REPEAT CULTURE WAS SENT  Remove vascular catheter and obtain follow-up blood cultures after the removal of the catheter []  []  DO NOT PLACE PICC LINE UNTIL WE ARE AT 4-5 DAYS FROM 7/14/17BLOOD CULTURES PROVIDED THERE IS  STILL NO GROWTH    Perform echocardiography to evaluate for endocarditis (transthoracic ECHO is 40-50% sensitive, TEE is > 90% sensitive) []  []  Please keep in mind, that neither test can definitively EXCLUDE endocarditis, and that should clinical suspicion remain high for endocarditis the patient should then still be treated with an "endocarditis" duration of therapy = 6 weeks  I WOULD CONSIDER TEE but even if negative would give her 6 weeks in as controlled a setting as possible    Consult electrophysiologist to evaluate implanted cardiac device (pacemaker, ICD) []  []    Ensure source control []  []  Have all abscesses been drained effectively? Have deep seeded infections (septic joints or osteomyelitis) had appropriate surgical debridement?   She apparently has  lesion on back that needs close monitoring    Investigate for "metastatic" sites of infection []  []  Does the patient have ANY symptom or physical exam finding that would suggest a deeper infection (back or neck pain that may be suggestive of vertebral osteomyelitis or epidural abscess, muscle pain that could be a symptom of pyomyositis)?  Keep in mind that for deep seeded infections MRI imaging with contrast is preferred rather than other often insensitive tests such as plain x-rays, especially early in a patient's presentation.  MRI of the T and L spine  NOT SHOWING INFECTIOUS PATHOLOGY   Change antibiotic therapy to vancomycin []  []  Beta-lactam antibiotics are preferred for MSSA due to higher cure rates.   If on Vancomycin, goal trough should be 15 - 20 mcg/mL  Estimated duration of IV antibiotic therapy: 6 weeks to 8 weeks but will need to be in a SNF. Could also consider unorthodox ideas such as long acting IV oritavancin vs zyvox but given severity would prefer SNF with IV Vancomycin    []  []  Consult case management for probably prolonged outpatient IV antibiotic therapy    Dr. Ninetta LightsHatcher will be covering for ID phone questions and followup for this patient tomorrow and during next week.    Acey LavCornelius Van Dam 11/17/2015, 3:43 PM

## 2015-11-17 NOTE — Progress Notes (Signed)
Pharmacy Antibiotic Note  Andrea King is a 40 y.o. female admitted on 11/13/2015 with Bacteremia (MRSA).  Pharmacy has been consulted for vancomycin.  Plan: Vancomycin trough= 13 mcg/ml with the 5th dose of 1 g q 8 hours. Will increase vancomycin dosing to 1250 mg iv q 8 hours and check another trough with the 5th dose of this new regimen.  Will monitor changes in renal function.   Height: 5\' 2"  (157.5 cm) Weight: 120 lb (54.432 kg) IBW/kg (Calculated) : 50.1  Temp (24hrs), Avg:99.4 F (37.4 C), Min:98.4 F (36.9 C), Max:100.5 F (38.1 C)   Recent Labs Lab 11/13/15 0100 11/13/15 0333 11/13/15 1341 11/14/15 0346 11/15/15 1551 11/16/15 1716 11/17/15 0743  WBC 27.0*  --   --  23.4* 23.0*  --  23.9*  CREATININE 0.67  --   --  0.54 0.33* 0.40* 0.45  LATICACIDVEN  --  1.6 2.2* 2.3*  --   --   --   VANCOTROUGH  --   --   --   --  4*  --  13*    Estimated Creatinine Clearance: 73.2 mL/min (by C-G formula based on Cr of 0.45).    No Known Allergies  Antimicrobials this admission: Zosyn 7/12 >> 7/12 vancomycin  7/12 >>    Dose adjustments this admission: Vancomycin increased to 1000 mg iv q 8 hours 7/15.  Vancomycin increased to 1250 mg iv q 8 hours 7/16.  Microbiology results: 7/12 BCx: MRSA 7/12 UCx: NG 7/12 SputumCx: pending  7/12 UA: (-)  Thank you for allowing pharmacy to be a part of this patient's care.  Luisa HartScott Jeromiah Ohalloran, PharmD Clinical Pharmacist   11/17/2015 8:30 AM

## 2015-11-18 ENCOUNTER — Encounter
Admission: EM | Discharge status: Against Medical Advice | Payer: Self-pay | Source: Home / Self Care | Attending: Internal Medicine

## 2015-11-18 ENCOUNTER — Encounter: Payer: Self-pay | Admitting: Cardiovascular Disease

## 2015-11-18 ENCOUNTER — Inpatient Hospital Stay (HOSPITAL_COMMUNITY)
Admit: 2015-11-18 | Discharge: 2015-11-18 | Disposition: A | Discharge status: Home / Self Care | Payer: Medicaid Other | Attending: Internal Medicine | Admitting: Internal Medicine

## 2015-11-18 DIAGNOSIS — I340 Nonrheumatic mitral (valve) insufficiency: Secondary | ICD-10-CM

## 2015-11-18 DIAGNOSIS — R7881 Bacteremia: Secondary | ICD-10-CM | POA: Insufficient documentation

## 2015-11-18 DIAGNOSIS — B958 Unspecified staphylococcus as the cause of diseases classified elsewhere: Secondary | ICD-10-CM | POA: Insufficient documentation

## 2015-11-18 HISTORY — PX: TEE WITHOUT CARDIOVERSION: SHX5443

## 2015-11-18 LAB — BASIC METABOLIC PANEL
ANION GAP: 8 (ref 5–15)
BUN: 5 mg/dL — AB (ref 6–20)
CALCIUM: 8 mg/dL — AB (ref 8.9–10.3)
CO2: 23 mmol/L (ref 22–32)
Chloride: 105 mmol/L (ref 101–111)
Creatinine, Ser: 0.46 mg/dL (ref 0.44–1.00)
Glucose, Bld: 111 mg/dL — ABNORMAL HIGH (ref 65–99)
POTASSIUM: 5.2 mmol/L — AB (ref 3.5–5.1)
SODIUM: 136 mmol/L (ref 135–145)

## 2015-11-18 LAB — CBC
HCT: 30.7 % — ABNORMAL LOW (ref 35.0–47.0)
HEMOGLOBIN: 10.4 g/dL — AB (ref 12.0–16.0)
MCH: 26.3 pg (ref 26.0–34.0)
MCHC: 33.9 g/dL (ref 32.0–36.0)
MCV: 77.6 fL — ABNORMAL LOW (ref 80.0–100.0)
PLATELETS: 511 10*3/uL — AB (ref 150–440)
RBC: 3.96 MIL/uL (ref 3.80–5.20)
RDW: 16.4 % — ABNORMAL HIGH (ref 11.5–14.5)
WBC: 26.2 10*3/uL — AB (ref 3.6–11.0)

## 2015-11-18 LAB — POTASSIUM: Potassium: 3.8 mmol/L (ref 3.5–5.1)

## 2015-11-18 LAB — VANCOMYCIN, TROUGH: VANCOMYCIN TR: 16 ug/mL (ref 15–20)

## 2015-11-18 SURGERY — ECHOCARDIOGRAM, TRANSESOPHAGEAL
Anesthesia: Moderate Sedation

## 2015-11-18 MED ORDER — LIDOCAINE VISCOUS 2 % MT SOLN
OROMUCOSAL | Status: AC
  Filled 2015-11-18: qty 15

## 2015-11-18 MED ORDER — FENTANYL CITRATE (PF) 100 MCG/2ML IJ SOLN
INTRAMUSCULAR | Status: DC | PRN
  Administered 2015-11-18: 50 ug via INTRAVENOUS

## 2015-11-18 MED ORDER — MIDAZOLAM HCL 5 MG/5ML IJ SOLN
INTRAMUSCULAR | Status: AC
  Filled 2015-11-18: qty 5

## 2015-11-18 MED ORDER — MIDAZOLAM HCL 5 MG/5ML IJ SOLN
INTRAMUSCULAR | Status: DC | PRN
  Administered 2015-11-18 (×2): 2 mg via INTRAVENOUS

## 2015-11-18 MED ORDER — FENTANYL CITRATE (PF) 100 MCG/2ML IJ SOLN
INTRAMUSCULAR | Status: AC
  Filled 2015-11-18: qty 2

## 2015-11-18 NOTE — Progress Notes (Signed)
Echocardiogram 2D Echocardiogram has been performed.  Dorothey BasemanReel, Takeia Ciaravino M 11/18/2015, 12:41 PM

## 2015-11-18 NOTE — H&P (View-Only) (Deleted)
[facility] - [place] at [facility]   PATIENT NAME: Andrea King    MR#:  [id number]  DATE OF BIRTH:  11/21/1975  SUBJECTIVE:  CHIEF COMPLAINT:   Chief Complaint  Patient presents with  . Chest Pain  Continues to complain of back pain, sweating a lot REVIEW OF SYSTEMS:   Review of Systems  Constitutional: Negative for fever, weight loss, malaise/fatigue and diaphoresis.  HENT: Negative for ear discharge, ear pain, hearing loss, nosebleeds, sore throat and tinnitus.   Eyes: Negative for blurred vision and pain.  Respiratory: Negative for cough, hemoptysis, shortness of breath and wheezing.   Cardiovascular: Positive for chest pain (rib pain). Negative for palpitations, orthopnea and leg swelling.  Gastrointestinal: Negative for heartburn, nausea, vomiting, abdominal pain, diarrhea, constipation and blood in stool.  Genitourinary: Negative for dysuria, urgency and frequency.  Musculoskeletal: Positive for back pain. Negative for myalgias.  Skin: Negative for itching and rash.  Neurological: Negative for dizziness, tingling, tremors, focal weakness, seizures, weakness and headaches.  Psychiatric/Behavioral: Negative for depression. The patient is not nervous/anxious.     DRUG ALLERGIES:  No Known Allergies VITALS:  Blood pressure 118/74, pulse 98, temperature 97.6 F (36.4 C), temperature source Oral, resp. rate 18, height  (1.575 m), weight 54.432 kg (120 lb), last menstrual period 11/13/2015, SpO2 92 %. PHYSICAL EXAMINATION:  GENERAL:  40 y.o.-year-old patient lying in the bed with c/o pain.  EYES: Pupils equal, round, reactive to light and accommodation. No scleral icterus. Extraocular muscles intact.  HEENT: Head atraumatic, normocephalic. Oropharynx and nasopharynx clear.  NECK:  Supple, no jugular venous distention. No thyroid enlargement, no tenderness.  LUNGS: Normal breath sounds bilaterally, some wheezing, some crepitation. No use of accessory  muscles of respiration.  CARDIOVASCULAR: S1, S2 normal. No murmurs, rubs, or gallops.  ABDOMEN: Soft, nontender, nondistended. Bowel sounds present. No organomegaly or mass.  EXTREMITIES: No pedal edema, cyanosis, or clubbing. On back- have a small  ( 1X2 cm) pimple which is tender to touch. It is on middle back area. NEUROLOGIC: Cranial nerves II through XII are intact. Muscle strength 5/5 in all extremities. Sensation intact. Gait not checked.  PSYCHIATRIC: The patient is alert and some anxious with pain. SKIN: No obvious rash, lesion, or ulcer.   Physical Exam LABORATORY PANEL:   CBC  Recent Labs Lab 11/17/15 0743  WBC 23.9*  HGB 9.7*  HCT 29.2*  PLT 436   ------------------------------------------------------------------------------------------------------------------  Chemistries   Recent Labs Lab 11/13/15 1341  11/17/15 0743  NA  --   < > 134*  K  --   < > 3.5  CL  --   < > 102  CO2  --   < > 25  GLUCOSE  --   < > 91  BUN  --   < > <5*  CREATININE  --   < > 0.45  CALCIUM  --   < > 7.5*  MG 2.0  --   --   < > = values in this interval not displayed. ------------------------------------------------------------------------------------------------------------------  ASSESSMENT AND PLAN:  40 y.o. female with a known history of Hepatitis C, bronchial asthma, substance abuse, anemia admitted with chest discomfort and difficulty breathing.  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 Likely infective emboli. - continue IV Vanco - maintain trough between 15-20 - ID consult appreciated. - Neg MRI of thorasic and lumber spine to r/o Osteomyelitis. - As MRSA bacteremia-  NO PICC LINE Until blood cx is negative for 4-5 days. Repeat cx on 14th of July need to remain negative for at least 4-5 days after which PICC line can be placed per infectious disease - agree - Due to IV drug abuse- she will  need to go to rehab for Long course of IV Abx. - [facility].  - will get PICC line on Tuesday/Wednesday if blood c/s remain neg from 14th of July - TEE possibly Today - Leukocytosis still worsening  * Uncontrolled pain - On Tylenol, Norco and oxycodone and ibuprofen  * IV drug abuse  - She has multi drug positive in urine screen.  - increased the dose of Norco and add oxycodone for better pain control  * Hypokalemia    Replete and resolved  * Hyponatremia   Resolved with IV hydration   All the records are reviewed and case discussed with Care Management/Social Worker. Management plans discussed with the patient, nursing and they are in agreement.  CODE STATUS: Full.  TOTAL TIME TAKING CARE OF THIS PATIENT: 35 minutes.    POSSIBLE D/C IN 2-3 DAYS, DEPENDING ON CLINICAL CONDITION.   [facility], Danajah Birdsell M.D on 11/18/2015   Between 7am to 6pm - Pager - [phone]  After 6pm go to [email or web address] - password [email or web address]  [facility] [place] [facility]  Office  [phone]  CC: Primary care physician; Alberteen SpindleSCIORA, [name], CNM  Note: This dictation was prepared with Dragon dictation along with smaller phrase technology. Any transcriptional errors that result from this process are unintentional.

## 2015-11-18 NOTE — Interval H&P Note (Deleted)
History and Physical Interval Note:  TEE was requested by Dr. Sherryll BurgerShah to evaluate for endocarditis. I reviewed the chart and reviewed the transthoracic echo images. The patient has MRSA bacteremia and has known history of IVDA. I discussed the procedure in details with the patient as well as risks and benefits. There is no contraindication for TEE.  11/18/2015 11:22 AM  Andrea King  has presented today for surgery, with the diagnosis of IN PT   Rm 138    TEE    Bacterimia    [facility]  The various methods of treatment have been discussed with the patient and family. After consideration of risks, benefits and other options for treatment, the patient has consented to  Procedure(s): TRANSESOPHAGEAL ECHOCARDIOGRAM (TEE) (N/A) as a surgical intervention .  The patient's history has been reviewed, patient examined, no change in status, stable for surgery.  I have reviewed the patient's chart and labs.  Questions were answered to the patient's satisfaction.     Lorine BearsMuhammad Priscella Donna

## 2015-11-18 NOTE — Progress Notes (Signed)
Patient refusing medications, lab draws and food. Order for NPO for testing. Patient currently on regular diet otherwise. As stated before patient did not eat, diet changed to NPO. Patient sweating. Main complaints of pain at this time. Transported for TEE test. Will continue to monitor

## 2015-11-18 NOTE — Progress Notes (Addendum)
[employer] - [place] at [facility]   PATIENT NAME: Andrea King    MR#:  [id number]  DATE OF BIRTH:  07/30/1975  SUBJECTIVE:  CHIEF COMPLAINT:   Chief Complaint  Patient presents with  . Chest Pain  Continues to complain of back pain, sweating a lot REVIEW OF SYSTEMS:   Review of Systems  Constitutional: Negative for fever, weight loss, malaise/fatigue and diaphoresis.  HENT: Negative for ear discharge, ear pain, hearing loss, nosebleeds, sore throat and tinnitus.   Eyes: Negative for blurred vision and pain.  Respiratory: Negative for cough, hemoptysis, shortness of breath and wheezing.   Cardiovascular: Positive for chest pain (rib pain). Negative for palpitations, orthopnea and leg swelling.  Gastrointestinal: Negative for heartburn, nausea, vomiting, abdominal pain, diarrhea, constipation and blood in stool.  Genitourinary: Negative for dysuria, urgency and frequency.  Musculoskeletal: Positive for back pain. Negative for myalgias.  Skin: Negative for itching and rash.  Neurological: Negative for dizziness, tingling, tremors, focal weakness, seizures, weakness and headaches.  Psychiatric/Behavioral: Negative for depression. The patient is not nervous/anxious.     DRUG ALLERGIES:  No Known Allergies VITALS:  Blood pressure 118/74, pulse 98, temperature 97.6 F (36.4 C), temperature source Oral, resp. rate 18, height 5' 2" (1.575 m), weight 54.432 kg (120 lb), last menstrual period 11/13/2015, SpO2 92 %. PHYSICAL EXAMINATION:  GENERAL:  39 y.o.-year-old patient lying in the bed with c/o pain.  EYES: Pupils equal, round, reactive to light and accommodation. No scleral icterus. Extraocular muscles intact.  HEENT: Head atraumatic, normocephalic. Oropharynx and nasopharynx clear.  NECK:  Supple, no jugular venous distention. No thyroid enlargement, no tenderness.  LUNGS: Normal breath sounds bilaterally, some wheezing, some crepitation. No use of accessory  muscles of respiration.  CARDIOVASCULAR: S1, S2 normal. No murmurs, rubs, or gallops.  ABDOMEN: Soft, nontender, nondistended. Bowel sounds present. No organomegaly or mass.  EXTREMITIES: No pedal edema, cyanosis, or clubbing. On back- have a small  ( 1X2 cm) pimple which is tender to touch. It is on middle back area. NEUROLOGIC: Cranial nerves II through XII are intact. Muscle strength 5/5 in all extremities. Sensation intact. Gait not checked.  PSYCHIATRIC: The patient is alert and some anxious with pain. SKIN: No obvious rash, lesion, or ulcer.   Physical Exam LABORATORY PANEL:   CBC  Recent Labs Lab 11/17/15 0743  WBC 23.9*  HGB 9.7*  HCT 29.2*  PLT 436   ------------------------------------------------------------------------------------------------------------------  Chemistries   Recent Labs Lab 11/13/15 1341  11/17/15 0743  NA  --   < > 134*  K  --   < > 3.5  CL  --   < > 102  CO2  --   < > 25  GLUCOSE  --   < > 91  BUN  --   < > <5*  CREATININE  --   < > 0.45  CALCIUM  --   < > 7.5*  MG 2.0  --   --   < > = values in this interval not displayed. ------------------------------------------------------------------------------------------------------------------  ASSESSMENT AND PLAN:  39 y.o. female with a known history of Hepatitis C, bronchial asthma, substance abuse, anemia admitted with chest discomfort and difficulty breathing.  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 Likely infective emboli. - continue IV Vanco - maintain trough between 15-20 - ID consult appreciated. - Neg MRI of thorasic and lumber spine to r/o Osteomyelitis. - As MRSA bacteremia-   NO PICC LINE Until blood cx is negative for 4-5 days. Repeat cx on 14th of July need to remain negative for at least 4-5 days after which PICC line can be placed per infectious disease - agree - Due to IV drug abuse- she will  need to go to rehab for Long course of IV Abx. - [facility].  - will get PICC line on Tuesday/Wednesday if blood c/s remain neg from 14th of July - TEE possibly Today - Leukocytosis still worsening  * Uncontrolled pain - On Tylenol, Norco and oxycodone and ibuprofen  * IV drug abuse  - She has multi drug positive in urine screen.  - increased the dose of Norco and add oxycodone for better pain control  * Hypokalemia    Replete and resolved  * Hyponatremia   Resolved with IV hydration   All the records are reviewed and case discussed with Care Management/Social Worker. Management plans discussed with the patient, nursing and they are in agreement.  CODE STATUS: Full.  TOTAL TIME TAKING CARE OF THIS PATIENT: 35 minutes.    POSSIBLE D/C IN 2-3 DAYS, DEPENDING ON CLINICAL CONDITION.   [facility], Taya Ashbaugh M.D on 11/18/2015   Between 7am to 6pm - Pager - [phone]  After 6pm go to [email or web address] - password [email or web address]  [facility] [place] [facility]  Office  [phone]  CC: Primary care physician; Alberteen SpindleSCIORA, [name], CNM  Note: This dictation was prepared with Dragon dictation along with smaller phrase technology. Any transcriptional errors that result from this process are unintentional.

## 2015-11-18 NOTE — CV Procedure (Signed)
TEE was performed without complication. It showed no evidence of endocarditis with normal valve structure and normal ejection fraction. Full report to follow.

## 2015-11-18 NOTE — Progress Notes (Signed)
Patient came down from inpatient room lethargic, reporting 10/10 back and rib pain, but was falling asleep during conversation. Gave patient 4mg  versed and 50mcg fentanyl during procedure. Tolerated procedure well, remaining alert most of procedure despite medications administered. Post procedure patient is lethargic. Maintaining oxygen saturation WNL on room air, vital signs stable. Report called to BanksJennifer on 1A. Informed RN of patient complaint of 10/10 pain but so lethargic that she is falling asleep mid-discussion. Resting quietly at this time, patient is asleep. Will return patient to assigned room shortly.

## 2015-11-18 NOTE — Progress Notes (Signed)
Pharmacy Antibiotic Note  Andrea King is a 40 y.o. female admitted on 11/13/2015 with Bacteremia (MRSA).  Pharmacy has been consulted for vancomycin.  Plan: Vancomycin trough= 16 mcg/ml prior to dose at 1700 this evening.  Continue current orders for Vancomycin 1250 mg IV q8h as trough is within goal range.  Continue to follow renal function and order trough levels as clinically indicated.   Height: 5\' 2"  (157.5 cm) Weight: 120 lb (54.432 kg) IBW/kg (Calculated) : 50.1  Temp (24hrs), Avg:98.6 F (37 C), Min:97.4 F (36.3 C), Max:100 F (37.8 C)   Recent Labs Lab 11/13/15 0100 11/13/15 0333 11/13/15 1341 11/14/15 0346  11/15/15 1551 11/16/15 1716 11/17/15 0743 11/18/15 1317 11/18/15 1648  WBC 27.0*  --   --  23.4*  --  23.0*  --  23.9* 26.2*  --   CREATININE 0.67  --   --  0.54  --  0.33* 0.40* 0.45 0.46  --   LATICACIDVEN  --  1.6 2.2* 2.3*  --   --   --   --   --   --   VANCOTROUGH  --   --   --   --   < > 4*  --  13*  --  16  < > = values in this interval not displayed.  Estimated Creatinine Clearance: 73.2 mL/min (by C-G formula based on Cr of 0.46).    No Known Allergies  Antimicrobials this admission: Zosyn 7/12 >> 7/12 vancomycin  7/12 >>    Dose adjustments this admission: Vancomycin increased to 1000 mg iv q 8 hours 7/15.  Vancomycin increased to 1250 mg iv q 8 hours 7/16.  Microbiology results: 7/12 BCx: MRSA 7/15 Follow Up BCx: NG x 2 7/12 UCx: NG 7/12 SputumCx: pending   Thank you for allowing pharmacy to be a part of this patient's care.  Clarisa Schoolsrystal Dickey Andrea King, PharmD Clinical Pharmacist 11/18/2015

## 2015-11-19 ENCOUNTER — Inpatient Hospital Stay: Payer: Medicaid Other

## 2015-11-19 LAB — CULTURE, BLOOD (ROUTINE X 2): CULTURE: NO GROWTH

## 2015-11-19 MED ORDER — ALPRAZOLAM 0.25 MG PO TABS
0.2500 mg | ORAL_TABLET | Freq: Three times a day (TID) | ORAL | Status: DC | PRN
  Administered 2015-11-19 (×2): 0.25 mg via ORAL
  Filled 2015-11-19 (×2): qty 1

## 2015-11-19 MED ORDER — SENNOSIDES-DOCUSATE SODIUM 8.6-50 MG PO TABS
1.0000 | ORAL_TABLET | Freq: Two times a day (BID) | ORAL | Status: DC
  Administered 2015-11-19 (×2): 1 via ORAL
  Filled 2015-11-19: qty 1

## 2015-11-19 MED ORDER — ALPRAZOLAM 0.5 MG PO TABS
0.5000 mg | ORAL_TABLET | Freq: Three times a day (TID) | ORAL | Status: DC | PRN
  Administered 2015-11-19: 0.25 mg via ORAL
  Administered 2015-11-19 – 2015-11-20 (×3): 0.5 mg via ORAL
  Filled 2015-11-19 (×4): qty 1

## 2015-11-19 NOTE — Plan of Care (Signed)
Problem: Education: Goal: Knowledge of  General Education information/materials will improve Outcome: Progressing Gave information about MRSA and treatment

## 2015-11-19 NOTE — Progress Notes (Signed)
[employer] - [place] at [facility]   PATIENT NAME: Andrea MannsRaina Capshaw    MR#:  [id number]  DATE OF BIRTH:  08-22-1975  SUBJECTIVE:  CHIEF COMPLAINT:   Chief Complaint  Patient presents with  . Chest Pain  Feeling somewhat better, wants something for nerves REVIEW OF SYSTEMS:   Review of Systems  Constitutional: Negative for fever, weight loss, malaise/fatigue and diaphoresis.  HENT: Negative for ear discharge, ear pain, hearing loss, nosebleeds, sore throat and tinnitus.   Eyes: Negative for blurred vision and pain.  Respiratory: Negative for cough, hemoptysis, shortness of breath and wheezing.   Cardiovascular: Positive for chest pain (rib pain). Negative for palpitations, orthopnea and leg swelling.  Gastrointestinal: Negative for heartburn, nausea, vomiting, abdominal pain, diarrhea, constipation and blood in stool.  Genitourinary: Negative for dysuria, urgency and frequency.  Musculoskeletal: Positive for back pain. Negative for myalgias.  Skin: Negative for itching and rash.  Neurological: Negative for dizziness, tingling, tremors, focal weakness, seizures, weakness and headaches.  Psychiatric/Behavioral: Negative for depression. The patient is not nervous/anxious.     DRUG ALLERGIES:  No Known Allergies VITALS:  Blood pressure 115/62, pulse 87, temperature 99.2 F (37.3 C), temperature source Oral, resp. rate 26, height 5\' 2"  (1.575 m), weight 54.432 kg (120 lb), last menstrual period 11/13/2015, SpO2 94 %. PHYSICAL EXAMINATION:  GENERAL:  40 y.o.-year-old patient lying in the bed with c/o pain.  EYES: Pupils equal, round, reactive to light and accommodation. No scleral icterus. Extraocular muscles intact.  HEENT: Head atraumatic, normocephalic. Oropharynx and nasopharynx clear.  NECK:  Supple, no jugular venous distention. No thyroid enlargement, no tenderness.  LUNGS: Normal breath sounds bilaterally, some wheezing, some crepitation. No use of accessory  muscles of respiration.  CARDIOVASCULAR: S1, S2 normal. No murmurs, rubs, or gallops.  ABDOMEN: Soft, nontender, nondistended. Bowel sounds present. No organomegaly or mass.  EXTREMITIES: No pedal edema, cyanosis, or clubbing. On back- have a small  ( 1X2 cm) pimple which is tender to touch. It is on middle back area. NEUROLOGIC: Cranial nerves II through XII are intact. Muscle strength 5/5 in all extremities. Sensation intact. Gait not checked.  PSYCHIATRIC: The patient is alert and some anxious with pain. SKIN: No obvious rash, lesion, or ulcer.   Physical Exam LABORATORY PANEL:   CBC  Recent Labs Lab 11/18/15 1317  WBC 26.2*  HGB 10.4*  HCT 30.7*  PLT 511*   ------------------------------------------------------------------------------------------------------------------  Chemistries   Recent Labs Lab 11/13/15 1341  11/18/15 1317 11/18/15 1648  NA  --   < > 136  --   K  --   < > 5.2* 3.8  CL  --   < > 105  --   CO2  --   < > 23  --   GLUCOSE  --   < > 111*  --   BUN  --   < > 5*  --   CREATININE  --   < > 0.46  --   CALCIUM  --   < > 8.0*  --   MG 2.0  --   --   --   < > = values in this interval not displayed. ------------------------------------------------------------------------------------------------------------------  ASSESSMENT AND PLAN:  40 y.o. female with a known history of Hepatitis C, bronchial asthma, substance abuse, anemia admitted with chest discomfort and difficulty breathing.  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 Likely infective emboli. - continue IV  Vanco - maintain trough between 15-20 - ID consult appreciated. - Neg MRI of thorasic and lumber spine to r/o Osteomyelitis. - As MRSA bacteremia- NO PICC LINE Until blood cx is negative for 4-5 days. Repeat cx on 14th of July still remains negative so will get PICC line today - Due to IV drug abuse- she will  need to go to rehab for Long course of IV Abx. - Possible discharge to [facility].  - will get PICC line on Tuesday/Wednesday if blood c/s remain neg from 14th of July - TEE did not show any vegetations - Leukocytosis still worsening  * Uncontrolled pain - On Tylenol, Norco and oxycodone and ibuprofen  * IV drug abuse  - She has multi drug positive in urine screen.  - increased the dose of Norco and add oxycodone for better pain control  * Hypokalemia    Replete and resolved  * Hyponatremia   Resolved with IV hydration  * Anxiety: - Xanax prn    All the records are reviewed and case discussed with Care Management/Social Worker. Management plans discussed with the patient, nursing and they are in agreement.  CODE STATUS: Full.  TOTAL TIME TAKING CARE OF THIS PATIENT: 35 minutes.    POSSIBLE D/C IN 1-2 days possibly tomorrow, DEPENDING ON CLINICAL CONDITION. And placement   [facility], Angele Wiemann M.D on 11/19/2015   Between 7am to 6pm - Pager - [phone]  After 6pm go to [email or web address] - password [email or web address]  [facility]  Office  [phone]  CC: Primary care physician; Alberteen Spindle, CNM  Note: This dictation was prepared with Dragon dictation along with smaller phrase technology. Any transcriptional errors that result from this process are unintentional.

## 2015-11-19 NOTE — Progress Notes (Signed)
Patient refused AM labs

## 2015-11-19 NOTE — Progress Notes (Addendum)
Clinical Social Worker (CSW) met with patient this afternoon to confirm plan. Patient reported that she is not happy about going to [facility] however she is agreeable. Patient will be picked up at 9 am tomorrow by [facility] transport out of [place]. CPS worker Apolonio Schneiders came to visit patient today at [facility]. MD and RN are aware of above. CSW will continue to follow and assist as needed.   [employer], LCSW [phone]

## 2015-11-19 NOTE — Progress Notes (Signed)
Patient doesn't have TB.

## 2015-11-19 NOTE — Progress Notes (Addendum)
Patient has no SNF bed offers at this time. Clinical Child psychotherapistocial Worker (CSW) has reached out to Chief Executive OfficerCSW director. CSW [facility] medical director Dr. Mervyn SkeetersA for assistant in SNF placement. Dr. Mervyn SkeetersA referred CSW to 3 facilities.  [facility]- Left admissions coordinator a voicemail [facility]- Admissions is reviewing referral [facility]- Faxed referral to admissions coordinator for review.   CSW will continue to follow and assist as needed.     [employer]Bailey Berneice Zettlemoyer, LCSW [phone]

## 2015-11-19 NOTE — Progress Notes (Signed)
[facility] offered patient a bed. [facility] transport agency in [place] will pick up patient tomorrow 11/20/15 at 9 am from [facility] and transport. Clinical Social Work (CSW) Mudlogger has approved 30 day LOG and transportation. CSW met with patient and made her aware of above. Patient asked if she could go to another hospital because she is not happy with her stay her. CSW provided emotional support and emphasized the going to [place] is the safest plan for her. MD also met with patient and emphasized the importance of receiving the IV Abx. RN aware of above. CSW left CPS worker a voicemail making her aware of above. CSW will continue to follow and assist as needed.   [employer], LCSW [phone]

## 2015-11-20 LAB — VANCOMYCIN, TROUGH: Vancomycin Tr: 16 ug/mL (ref 15–20)

## 2015-11-20 LAB — CULTURE, BLOOD (ROUTINE X 2): CULTURE: NO GROWTH

## 2015-11-20 LAB — CREATININE, SERUM: Creatinine, Ser: 0.48 mg/dL (ref 0.44–1.00)

## 2015-11-20 MED ORDER — METHOCARBAMOL 750 MG PO TABS: 750.0000 mg | ORAL_TABLET | Freq: Three times a day (TID) | ORAL | Status: DC

## 2015-11-20 MED ORDER — HYDROCODONE-ACETAMINOPHEN 5-325 MG PO TABS
2.0000 | ORAL_TABLET | Freq: Four times a day (QID) | ORAL | Status: DC | PRN
Start: 2015-11-20 — End: 2017-09-20

## 2015-11-20 MED ORDER — SENNOSIDES-DOCUSATE SODIUM 8.6-50 MG PO TABS: 2.0000 | ORAL_TABLET | Freq: Two times a day (BID) | ORAL | Status: DC

## 2015-11-20 MED ORDER — SULFAMETHOXAZOLE-TRIMETHOPRIM 800-160 MG PO TABS: 1.0000 | ORAL_TABLET | Freq: Two times a day (BID) | ORAL | Status: AC

## 2015-11-20 MED ORDER — AMITRIPTYLINE HCL 25 MG PO TABS: 25.0000 mg | ORAL_TABLET | Freq: Every day | ORAL | Status: DC

## 2015-11-20 MED ORDER — VANCOMYCIN HCL 10 G IV SOLR: 1250.0000 mg | Freq: Three times a day (TID) | INTRAVENOUS | Status: DC

## 2015-11-20 MED ORDER — ALPRAZOLAM 0.5 MG PO TABS: 0.5000 mg | ORAL_TABLET | Freq: Three times a day (TID) | ORAL | Status: DC | PRN

## 2015-11-20 MED ORDER — LIDOCAINE 5 % EX PTCH: 1.0000 | MEDICATED_PATCH | CUTANEOUS | Status: DC

## 2015-11-20 NOTE — Progress Notes (Signed)
[facility] came to transport patient and she refused to go. MD and Clinical Social Worker (CSW) met with patient extensively to discuss the risk of not going. Patient continued to refuse to go and transport left. CSW Mudlogger was notified. CPS worker made aware of above.    [employer], LCSW [phone]

## 2015-11-20 NOTE — Progress Notes (Signed)
Patient is medically stable for [facility]. [facility] come today to room 138-A. RN will call report. [facility] transport agency is picking patient up at 9 am. Clinical Child psychotherapistocial Worker (CSW) sent D/C orders to [facility]. CSW Chiropodistassistant director approved a 30 day LOG. Patient is aware of above. CPS worker Fleet ContrasRachel came to visit patient this morning. Please reconsult if future social work needs arise. CSW signing off.   [employer]Bailey Branden Shallenberger, LCSW [phone]

## 2015-11-20 NOTE — Progress Notes (Signed)
Pharmacy Antibiotic Note  Andrea King is a 40 y.o. female admitted on 11/13/2015 with Bacteremia (MRSA).  Pharmacy has been consulted for vancomycin.  Plan: Trough level resulted @ 16 mcg/ml. Will continue current regimen. Will order trough level as needed.    Height: 5\' 2"  (157.5 cm) Weight: 120 lb (54.432 kg) IBW/kg (Calculated) : 50.1  Temp (24hrs), Avg:98.7 F (37.1 C), Min:97.6 F (36.4 C), Max:99.7 F (37.6 C)   Recent Labs Lab 11/14/15 0346  11/15/15 1551 11/16/15 1716 11/17/15 0743 11/18/15 1317 11/18/15 1648 11/20/15 1007  WBC 23.4*  --  23.0*  --  23.9* 26.2*  --   --   CREATININE 0.54  --  0.33* 0.40* 0.45 0.46  --  0.48  LATICACIDVEN 2.3*  --   --   --   --   --   --   --   VANCOTROUGH  --   < > 4*  --  13*  --  16 16  < > = values in this interval not displayed.  Estimated Creatinine Clearance: 73.2 mL/min (by C-G formula based on Cr of 0.48).    No Known Allergies  Antimicrobials this admission: Zosyn 7/12 >> 7/12 vancomycin  7/12 >>    Dose adjustments this admission: Vancomycin increased to 1000 mg iv q 8 hours 7/15.  Vancomycin increased to 1250 mg iv q 8 hours 7/16.  Microbiology results: 7/12 BCx: MRSA 7/15 Follow Up BCx: NG x 2 7/12 UCx: NG 7/12 SputumCx: pending   Thank you for allowing pharmacy to be a part of this patient's care.  Clarisa Schoolsrystal Scarpena, PharmD Clinical Pharmacist 11/20/2015

## 2015-11-20 NOTE — Progress Notes (Signed)
Dr. Sherryll BurgerShah called this RN after patient left and stated that he would like her to take an oral ABX. This RN called [phone], and left a message for the patient to [facility].

## 2015-11-20 NOTE — Progress Notes (Signed)
Patient adamant in wanting to go AMA. She has decision making capacity per Psych. She understands all the risks including worsening bacteremia and death. Will get PICC line removed before D/C.

## 2015-11-20 NOTE — Discharge Summary (Addendum)
[facility] - [place] at [facility]   PATIENT NAME: Andrea King    MR#:  [id number]  DATE OF BIRTH:  March 29, 1976  DATE OF ADMISSION:  11/13/2015 ADMITTING PHYSICIAN: Ihor Austin, MD  DATE OF DISCHARGE: 11/20/2015  PRIMARY CARE PHYSICIAN: Alberteen Spindle, CNM    ADMISSION DIAGNOSIS:  Community acquired pneumonia [J18.9] Sepsis, due to unspecified organism (HCC) [A41.9]  DISCHARGE DIAGNOSIS:  Principal Problem:   Sepsis (HCC) Active Problems:   Community acquired pneumonia   Pneumonia   Substance induced mood disorder (HCC)   Opiate withdrawal (HCC)   Opiate abuse, continuous   Bacteremia due to Staphylococcus  SECONDARY DIAGNOSIS:   Past Medical History  Diagnosis Date  . Hepatitis C   . Asthma   . Substance abuse   . Anemia   . DJD (degenerative joint disease)    HOSPITAL COURSE:  40 y.o. female with a known history of Hepatitis C, bronchial asthma, substance abuse, anemia admitted with chest discomfort and difficulty breathing.  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 Likely infective emboli. - continue IV Vanco to 6 weeks via PICC line- maintain trough between 15-20  * Uncontrolled pain - On Tylenol, Norco and ibuprofen  * IV drug abuse - She has multi drug positive in urine screen. - Counseled  * Hypokalemia  Replete and resolved  * Hyponatremia  Resolved with IV hydration  * Anxiety: - Xanax prn  She decided to not go to facility and now wants to leave AMA, psych evaluated and declared her competent to make her own decisions. Will remove PICC line and let her leave AMA. Risks explained but she is very adamant. DISCHARGE CONDITIONS:   FAIR  CONSULTS OBTAINED:  Treatment Team:  Mick Sell, MD Audery Amel, MD  DRUG ALLERGIES:  No Known Allergies  DISCHARGE MEDICATIONS:   Current Discharge  Medication List    START taking these medications   Details  ALPRAZolam (XANAX) 0.5 MG tablet Take 1 tablet (0.5 mg total) by mouth 3 (three) times daily as needed for anxiety. Qty: 15 tablet, Refills: 0    amitriptyline (ELAVIL) 25 MG tablet Take 1 tablet (25 mg total) by mouth at bedtime. Qty: 30 tablet, Refills: 0    HYDROcodone-acetaminophen (NORCO/VICODIN) 5-325 MG tablet Take 2 tablets by mouth every 6 (six) hours as needed for severe pain. Qty: 15 tablet, Refills: 0    lidocaine (LIDODERM) 5 % Place 1 patch onto the skin daily. Remove & Discard patch within 12 hours or as directed by MD Qty: 10 patch, Refills: 0    methocarbamol (ROBAXIN) 750 MG tablet Take 1 tablet (750 mg total) by mouth 3 (three) times daily. Qty: 90 tablet, Refills: 0    senna-docusate (SENOKOT-S) 8.6-50 MG tablet Take 2 tablets by mouth 2 (two) times daily. Qty: 60 tablet, Refills: 0    vancomycin 1,250 mg in sodium chloride 0.9 % 250 mL Inject 1,250 mg into the vein every 8 (eight) hours. For 5 more weeks Qty: 30 Bottle, Refills: 10      CONTINUE these medications which have NOT CHANGED   Details  acetaminophen (TYLENOL) 500 MG tablet Take 1,000 mg by mouth every 6 (six) hours as needed for moderate pain.    ibuprofen (ADVIL,MOTRIN) 200 MG tablet Take 600 mg by mouth every 6 (six) hours as needed for moderate pain.  DISCHARGE INSTRUCTIONS:    DIET:  Regular diet  DISCHARGE CONDITION:  Good  ACTIVITY:  Activity as tolerated  OXYGEN:  Home Oxygen: No.   Oxygen Delivery: room air  DISCHARGE LOCATION:  nursing home   If you experience worsening of your admission symptoms, develop shortness of breath, life threatening emergency, suicidal or homicidal thoughts you must seek medical attention immediately by calling 911 or calling your MD immediately  if symptoms less severe.  You Must read complete instructions/literature along with all the possible adverse reactions/side effects  for all the Medicines you take and that have been prescribed to you. Take any new Medicines after you have completely understood and accpet all the possible adverse reactions/side effects.   Please note  You were cared for by a hospitalist during your hospital stay. If you have any questions about your discharge medications or the care you received while you were in the hospital after you are discharged, you can call the unit and asked to speak with the hospitalist on call if the hospitalist that took care of you is not available. Once you are discharged, your primary care physician will handle any further medical issues. Please note that NO REFILLS for any discharge medications will be authorized once you are discharged, as it is imperative that you return to your primary care physician (or establish a relationship with a primary care physician if you do not have one) for your aftercare needs so that they can reassess your need for medications and monitor your lab values.    On the day of Discharge:  VITAL SIGNS:  Blood pressure 128/66, pulse 100, temperature 97.6 F (36.4 C), temperature source Oral, resp. rate 20, height 5\' 2"  (1.575 m), weight 54.432 kg (120 lb), last menstrual period 11/13/2015, SpO2 98 %. PHYSICAL EXAMINATION:  GENERAL:  40 y.o.-year-old patient lying in the bed with no acute distress.  EYES: Pupils equal, round, reactive to light and accommodation. No scleral icterus. Extraocular muscles intact.  HEENT: Head atraumatic, normocephalic. Oropharynx and nasopharynx clear.  NECK:  Supple, no jugular venous distention. No thyroid enlargement, no tenderness.  LUNGS: Normal breath sounds bilaterally, no wheezing, rales,rhonchi or crepitation. No use of accessory muscles of respiration.  CARDIOVASCULAR: S1, S2 normal. No murmurs, rubs, or gallops.  ABDOMEN: Soft, non-tender, non-distended. Bowel sounds present. No organomegaly or mass.  EXTREMITIES: No pedal edema, cyanosis, or  clubbing.  NEUROLOGIC: Cranial nerves II through XII are intact. Muscle strength 5/5 in all extremities. Sensation intact. Gait not checked.  PSYCHIATRIC: The patient is alert and oriented x 3.  SKIN: No obvious rash, lesion, or ulcer.  DATA REVIEW:   CBC  Recent Labs Lab 11/18/15 1317  WBC 26.2*  HGB 10.4*  HCT 30.7*  PLT 511*    Chemistries   Recent Labs Lab 11/13/15 1341  11/18/15 1317 11/18/15 1648  NA  --   < > 136  --   K  --   < > 5.2* 3.8  CL  --   < > 105  --   CO2  --   < > 23  --   GLUCOSE  --   < > 111*  --   BUN  --   < > 5*  --   CREATININE  --   < > 0.46  --   CALCIUM  --   < > 8.0*  --   MG 2.0  --   --   --   < > = values  in this interval not displayed.  Follow-up Information    Follow up with [facility].   Specialty:  Skilled Nursing Facility   Contact information:   [address] [place] [address] [phone]      Follow up with Arnetha Courser A, CNM. Schedule an appointment as soon as possible for a visit in 1 week.   Why:  [facility] Discharge F/UP   Contact information:   [address] [place] [address] [phone]       Follow up with [facility], DAVID P, MD. Schedule an appointment as soon as possible for a visit in 2 weeks.   Specialty:  Infectious Diseases   Why:  [facility] Discharge F/UP   Contact information:   [address] [place] [address] [phone]       Management plans discussed with the patient, family and they are in agreement.  She is at very high risk for readmission. Note, she is leaving AGAINST MEDICAL ADVICE  CODE STATUS: Full code  TOTAL TIME TAKING CARE OF THIS PATIENT: 45 minutes.    [facility], Slade Pierpoint M.D on 11/20/2015 at 8:28 AM  Between 7am to 6pm - Pager - [phone]  After 6pm go to [email or web address] - password [email or web address] [facility]  [facility]  Office  [phone]  CC: Primary care physician; Alberteen Spindle, CNM   Note: This dictation was prepared with Dragon dictation along with smaller phrase technology. Any transcriptional errors that result from this process are unintentional.

## 2015-11-20 NOTE — Progress Notes (Signed)
[facility] - [place] at [facility]   PATIENT NAME: Andrea MannsRaina King    MR#:  [id number]  DATE OF BIRTH:  [birth year]  SUBJECTIVE:  CHIEF COMPLAINT:   Chief Complaint  Patient presents with  . Chest Pain  was planned to go to rest room and she refused.   REVIEW OF SYSTEMS:   Review of Systems  Constitutional: Negative for fever, weight loss, malaise/fatigue and diaphoresis.  HENT: Negative for ear discharge, ear pain, hearing loss, nosebleeds, sore throat and tinnitus.   Eyes: Negative for blurred vision and pain.  Respiratory: Negative for cough, hemoptysis, shortness of breath and wheezing.   Cardiovascular: Positive for chest pain (rib pain). Negative for palpitations, orthopnea and leg swelling.  Gastrointestinal: Negative for heartburn, nausea, vomiting, abdominal pain, diarrhea, constipation and blood in stool.  Genitourinary: Negative for dysuria, urgency and frequency.  Musculoskeletal: Positive for back pain. Negative for myalgias.  Skin: Negative for itching and rash.  Neurological: Negative for dizziness, tingling, tremors, focal weakness, seizures, weakness and headaches.  Psychiatric/Behavioral: Negative for depression. The patient is not nervous/anxious.    DRUG ALLERGIES:  No Known Allergies VITALS:  Blood pressure 128/66, pulse 100, temperature 97.6 F (36.4 C), temperature source Oral, resp. rate 20, height 5\' 2"  (1.575 m), weight 54.432 kg (120 lb), last menstrual period 11/13/2015, SpO2 98 %. PHYSICAL EXAMINATION:   Physical Exam  Constitutional: She is oriented to person, place, and time and well-developed, well-nourished, and in no distress.  HENT:  Head: Normocephalic and atraumatic.  Eyes: Conjunctivae and EOM are normal. Pupils are equal, round, and reactive to light.  Neck: Normal range of motion. Neck supple. No tracheal deviation present. No thyromegaly present.  Cardiovascular: Normal rate, regular rhythm and normal heart sounds.    Pulmonary/Chest: Effort normal and breath sounds normal. No respiratory distress. She has no wheezes. She exhibits no tenderness.  Abdominal: Soft. Bowel sounds are normal. She exhibits no distension. There is no tenderness.  Musculoskeletal: Normal range of motion.  Neurological: She is alert and oriented to person, place, and time. No cranial nerve deficit.  Skin: Skin is warm and dry. No rash noted.  Psychiatric: Her mood appears anxious. Her affect is inappropriate. She is agitated. She expresses impulsivity. She is concerned with wish fulfillment. She exhibits disordered thought content.   LABORATORY PANEL:   CBC  Recent Labs Lab 11/18/15 1317  WBC 26.2*  HGB 10.4*  HCT 30.7*  PLT 511*   ------------------------------------------------------------------------------------------------------------------  Chemistries   Recent Labs Lab 11/13/15 1341  11/18/15 1317 11/18/15 1648  NA  --   < > 136  --   K  --   < > 5.2* 3.8  CL  --   < > 105  --   CO2  --   < > 23  --   GLUCOSE  --   < > 111*  --   BUN  --   < > 5*  --   CREATININE  --   < > 0.46  --   CALCIUM  --   < > 8.0*  --   MG 2.0  --   --   --   < > = values in this interval not displayed. ------------------------------------------------------------------------------------------------------------------  ASSESSMENT AND PLAN:  40 y.o. female with a known history of Hepatitis C, bronchial asthma, substance abuse, anemia admitted with chest discomfort and difficulty breathing.  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 Likely infective emboli. - continue IV Vanco - maintain trough between 15-20 - As MRSA bacteremia- s/p PICC line on 7/18 - Due to IV drug abuse- she will need to go to rehab for Long course of IV Abx.  - TEE did not show any vegetations - Leukocytosis still worsening  * Uncontrolled pain - On Tylenol, Norco and  oxycodone and ibuprofen  * IV drug abuse  - She has multi drug positive in urine screen.  - increased the dose of Norco and add oxycodone for better pain control  * Hypokalemia    Replete and resolved  * Hyponatremia   Resolved with IV hydration  * Anxiety: - Xanax prn   Patient was medically stable for D/C to [facility] today. But patient refused last minute. Wanting to go to [facility] which I don't think is a rational decision as for that she will have to go AMA so for now I'm filing IVC papers for her until Psych can assess her decision making capacity.  Discussed with Dr. Toni Amend   All the records are reviewed and case discussed with Care Management/Social Worker. Management plans discussed with the patient, nursing and they are in agreement.  CODE STATUS: Full.  TOTAL TIME TAKING CARE OF THIS PATIENT: 35 minutes.    D/C is going to be very difficult   [facility], Avian Greenawalt M.D on 11/20/2015   Between 7am to 6pm - Pager - [phone]  After 6pm go to [email or web address] - password [email or web address]  [facility]  Office  [phone]  CC: Primary care physician; Alberteen Spindle, CNM  Note: This dictation was prepared with Dragon dictation along with smaller phrase technology. Any transcriptional errors that result from this process are unintentional.

## 2015-11-20 NOTE — Progress Notes (Signed)
[facility] - recommended giving oral Bactrim for total 6 weeks now and she is leaving AGAINST MEDICAL ADVICE.

## 2015-11-20 NOTE — Progress Notes (Signed)
Patient signed AMA paperwork after discussions with Dr. Toni Amendlapacs.  Dr Sherryll BurgerShah is aware. PICC line removed and educated about.  This RN had a discussion with patient about how important her care is and treatment. Patient said that she understood, but "[facility] has free care that will pay for everything and be closer to my kids."  NSL also removed.

## 2015-11-20 NOTE — Discharge Instructions (Signed)
Sepsis, Adult Sepsis is a serious infection of your blood or tissues that affects your whole body. The infection that causes sepsis may be bacterial, viral, fungal, or parasitic. Sepsis may be life threatening. Sepsis can cause your blood pressure to drop. This may result in shock. Shock causes your central nervous system and your organs to stop working correctly.  RISK FACTORS Sepsis can happen in anyone, but it is more likely to happen in people who have weakened immune systems. SIGNS AND SYMPTOMS  Symptoms of sepsis can include:  Fever or low body temperature (hypothermia).  Rapid breathing (hyperventilation).  Chills.  Rapid heartbeat (tachycardia).  Confusion or light-headedness.  Trouble breathing.  Urinating much less than usual.  Cool, clammy skin or red, flushed skin.  Other problems with the heart, kidneys, or brain. DIAGNOSIS  Your health care provider will likely do tests to look for an infection, to see if the infection has spread to your blood, and to see how serious your condition is. Tests can include:  Blood tests, including cultures of your blood.  Cultures of other fluids from your body, such as:  Urine.  Pus from wounds.  Mucus coughed up from your lungs.  Urine tests other than cultures.  X-ray exams or other imaging tests. TREATMENT  Treatment will begin with elimination of the source of infection. If your sepsis is likely caused by a bacterial or fungal infection, you will be given antibiotic or antifungal medicines. You may also receive:  Oxygen.  Fluids through an IV tube.  Medicines to increase your blood pressure.  A machine to clean your blood (dialysis) if your kidneys fail.  A machine to help you breathe if your lungs fail. SEEK IMMEDIATE MEDICAL CARE IF: You get an infection or develop any of the signs and symptoms of sepsis after surgery or a hospitalization.   This information is not intended to replace advice given to you by  your health care provider. Make sure you discuss any questions you have with your health care provider.   Document Released: 01/17/2003 Document Revised: 09/04/2014 Document Reviewed: 12/26/2012 Elsevier Interactive Patient Education 2016 [employer]  

## 2015-11-20 NOTE — Consult Note (Signed)
[place] [facility] Consult   Reason for Consult:  Consult for 40 year old woman with a history of opiate dependence currently in the hospital being treated for MRSA infection and endocarditis. Reconsult specifically about capacity and whether she meets commitment criteria Referring Physician:  Manuella Ghazi Patient Identification: Andrea King MRN:  [id number] Principal Diagnosis: Sepsis [facility]) Diagnosis:   Patient Active Problem List   Diagnosis Date Noted  . Bacteremia due to Staphylococcus [R78.81, B95.8]   . Sepsis ([place]) [A41.9] 11/13/2015  . Community acquired pneumonia [J18.9] 11/13/2015  . Pneumonia [J18.9] 11/13/2015  . Substance induced mood disorder ([place]) [F19.94] 11/13/2015  . Opiate withdrawal ([place]) [F11.23] 11/13/2015  . Opiate abuse, continuous [F11.10] 11/13/2015  . Incarcerated femoral hernia left [K41.30] 04/28/2013    Total Time spent with patient: 45 minutes  Subjective:   Andrea King is a 40 y.o. female patient admitted with "I just need to go to a better facility".  HPI:  Patient interviewed. I had already been following up loosely with this patient during her hospital stay. 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 Recommendation had been made for several weeks of intravenous antibiotics. The plan was for transfer from our inpatient unit to a rehabilitation or nursing facility where she could continue to receive daily intravenous antibiotics until the full course was completed. Apparently the facility that had been found that was available was in [place] which is about 2 hours away down near [place]. Just as transportation was being arranged the patient abruptly announced that she would refuse to go. Commitment papers were filed on the grounds that she appeared to be refusing a lifesaving treatment. On evaluation today the patient  tells me that she does not want to go to [place] because it is too far away from her children. She has multiple children at least one or 2 of them very young. Although it doesn't sound like she was all that involved in their day-to-day care before coming in the hospital she still feels like she wants to be geographically closer to them if she is going to still be in the hospital for another few weeks. Patient does understand that she has a pneumonia and blood infection. She understands that the recommendation has been made for daily intravenous antibiotics to continue for several weeks. She understands that if she fails to treat this infection there is a significant risk of death from her severe illness. She is able to say all of those things spontaneously. Patient states that her plan is that she would leave the hospital here in [place] and that this very day she would go to the emergency room at [facility]. She states that if she goes to the emergency room at [facility] in [place] and tells them what is wrong with her they will admit her to their hospital and give for the antibiotics there. She says she would much rather be in [facility] for the next several weeks. I explained to the patient that what she has right now is really not an emergency room problem and that she already has adequate and appropriate treatment in place. Patient makes the point that if she goes to the emergency room in [facility] they cannot turn her away and that if she explains what is wrong with her that they will have to give her the appropriate treatment. Patient denies being  depressed. Denies any suicidal thoughts whatsoever. Denies any psychotic symptoms.  Past Psychiatric History: Long-standing history of opiate abuse as detailed previously. Mood symptoms primarily in the context of substance abuse.  Risk to Self: Is patient at risk for suicide?: No Risk to Others:   Prior Inpatient Therapy:   Prior  Outpatient Therapy:    Past Medical History:  Past Medical History  Diagnosis Date  . Hepatitis C   . Asthma   . Substance abuse   . Anemia   . DJD (degenerative joint disease)     Past Surgical History  Procedure Laterality Date  . Tumor excision      left scalpula  . Inguinal hernia repair Left 04/28/2013    Procedure: HERNIA REPAIR INGUINAL ADULT with mesh;  Surgeon: Gwenyth Ober, MD;  Location: [place];  Service: General;  Laterality: Left;  . Tee without cardioversion N/A 11/18/2015    Procedure: TRANSESOPHAGEAL ECHOCARDIOGRAM (TEE);  Surgeon: Wellington Hampshire, MD;  Location: [facility];  Service: Cardiovascular;  Laterality: N/A;   Family History:  Family History  Problem Relation Age of Onset  . Cancer Mother   . Cancer - Colon Maternal Grandfather   . Diabetes Maternal Grandfather    Family Psychiatric  History: See previous notes for information. Social History:  History  Alcohol Use No     History  Drug Use  . Yes    Comment: cocaine,Heroin    Social History   Social History  . Marital Status: Single    Spouse Name: N/A  . Number of Children: N/A  . Years of Education: N/A   Occupational History  . unemployed    Social History Main Topics  . Smoking status: Current Every Day Smoker    Types: Cigarettes  . Smokeless tobacco: None  . Alcohol Use: No  . Drug Use: Yes     Comment: cocaine,Heroin  . Sexual Activity: Yes    Birth Control/ Protection: None   Other Topics Concern  . None   Social History Narrative   Additional Social History:    Allergies:  No Known Allergies  Labs:  Results for orders placed or performed during the hospital encounter of 11/13/15 (from the past 48 hour(s))  Vancomycin, trough     Status: None   Collection Time: 11/18/15  4:48 PM  Result Value Ref Range   Vancomycin Tr 16 15 - 20 ug/mL  Potassium     Status: None   Collection Time: 11/18/15  4:48 PM  Result Value Ref Range   Potassium 3.8 3.5 - 5.1 mmol/L   Creatinine, serum     Status: None   Collection Time: 11/20/15 10:07 AM  Result Value Ref Range   Creatinine, Ser 0.48 0.44 - 1.00 mg/dL   GFR calc non Af Amer >60 >60 mL/min   GFR calc Af Amer >60 >60 mL/min    Comment: (NOTE) The eGFR has been calculated using the CKD EPI equation. This calculation has not been validated in all clinical situations. eGFR's persistently <60 mL/min signify possible Chronic Kidney Disease.   Vancomycin, trough     Status: None   Collection Time: 11/20/15 10:07 AM  Result Value Ref Range   Vancomycin Tr 16 15 - 20 ug/mL    Current Facility-Administered Medications  Medication Dose Route Frequency Provider Last Rate Last Dose  . 0.9 %  sodium chloride infusion   Intravenous Continuous Saundra Shelling, MD 125 mL/hr at 11/19/15 2058    . acetaminophen (TYLENOL)  tablet 650 mg  650 mg Oral Q6H PRN Saundra Shelling, MD   650 mg at 11/15/15 1811   Or  . acetaminophen (TYLENOL) suppository 650 mg  650 mg Rectal Q6H PRN Saundra Shelling, MD      . ALPRAZolam Duanne Moron) tablet 0.5 mg  0.5 mg Oral TID PRN Max Sane, MD   0.5 mg at 11/20/15 1237  . amitriptyline (ELAVIL) tablet 25 mg  25 mg Oral QHS Gonzella Lex, MD   25 mg at 11/19/15 2054  . enoxaparin (LOVENOX) injection 40 mg  40 mg Subcutaneous Q24H Saundra Shelling, MD   40 mg at 11/19/15 1328  . HYDROcodone-acetaminophen (NORCO/VICODIN) 5-325 MG per tablet 2 tablet  2 tablet Oral Q4H PRN Max Sane, MD   2 tablet at 11/20/15 4401  . ibuprofen (ADVIL,MOTRIN) tablet 400 mg  400 mg Oral Q6H PRN Vaughan Basta, MD   400 mg at 11/20/15 1234  . lidocaine (LIDODERM) 5 % 1 patch  1 patch Transdermal Q24H Loletha Grayer, MD   1 patch at 11/19/15 2050  . methocarbamol (ROBAXIN) tablet 750 mg  750 mg Oral TID Gonzella Lex, MD   750 mg at 11/20/15 1125  . multivitamin with minerals tablet 1 tablet  1 tablet Oral Daily Saundra Shelling, MD   1 tablet at 11/19/15 1327  . ondansetron (ZOFRAN) tablet 4 mg  4 mg Oral Q6H PRN  Saundra Shelling, MD   4 mg at 11/18/15 1927   Or  . ondansetron (ZOFRAN) injection 4 mg  4 mg Intravenous Q6H PRN Saundra Shelling, MD      . oxyCODONE (Oxy IR/ROXICODONE) immediate release tablet 5 mg  5 mg Oral Q6H PRN Max Sane, MD   5 mg at 11/20/15 1234  . senna-docusate (Senokot-S) tablet 1 tablet  1 tablet Oral BID Max Sane, MD   1 tablet at 11/19/15 2052  . sodium chloride flush (NS) 0.9 % injection 3 mL  3 mL Intravenous Q12H Saundra Shelling, MD   3 mL at 11/19/15 1728  . vancomycin (VANCOCIN) 1,250 mg in sodium chloride 0.9 % 250 mL IVPB  1,250 mg Intravenous Q8H Vipul Shah, MD   1,250 mg at 11/20/15 1126    Musculoskeletal: Strength & Muscle Tone: within normal limits Gait & Station: normal Patient leans: N/A  Psychiatric Specialty Exam: Physical Exam  Nursing note and vitals reviewed. Constitutional: She appears well-developed and well-nourished.  HENT:  Head: Normocephalic and atraumatic.  Eyes: Conjunctivae are normal. Pupils are equal, round, and reactive to light.  Neck: Normal range of motion.  Cardiovascular: Regular rhythm and normal heart sounds.   Respiratory: Effort normal. No respiratory distress.  GI: Soft.  Musculoskeletal: Normal range of motion.  Neurological: She is alert.  Skin: Skin is warm and dry.  Psychiatric: She has a normal mood and affect. Her speech is normal and behavior is normal. Thought content normal. Cognition and memory are normal. She expresses impulsivity. She expresses no suicidal ideation.    Review of Systems  Constitutional: Negative.   HENT: Negative.   Eyes: Negative.   Respiratory: Negative.   Cardiovascular: Negative.   Gastrointestinal: Negative.   Musculoskeletal: Negative.   Skin: Negative.   Neurological: Negative.   Psychiatric/Behavioral: Negative for depression, suicidal ideas, hallucinations, memory loss and substance abuse. The patient is nervous/anxious. The patient does not have insomnia.     Blood pressure  128/66, pulse 100, temperature 97.6 F (36.4 C), temperature source Oral, resp. rate 20, height '5\' 2"'$  (1.575  m), weight 54.432 kg (120 lb), last menstrual period 11/13/2015, SpO2 98 %.Body mass index is 21.94 kg/(m^2).  General Appearance: Casual  Eye Contact:  Good  Speech:  Clear and Coherent  Volume:  Normal  Mood:  Euthymic  Affect:  Congruent  Thought Process:  Goal Directed  Orientation:  Full (Time, Place, and Person)  Thought Content:  Logical  Suicidal Thoughts:  No  Homicidal Thoughts:  No  Memory:  Immediate;   Good Recent;   Fair Remote;   Fair  Judgement:  Other:  Although the patient's judgment is somewhat impulsive and based on a plan that carry some risks with that it also is not completely irrational.  Insight:  Fair  Psychomotor Activity:  Normal  Concentration:  Concentration: Fair  Recall:  [employer] of Knowledge:  Fair  Language:  Fair  Akathisia:  No  Handed:  Right  AIMS (if indicated):     Assets:  Desire for Improvement Financial Resources/Insurance Resilience  ADL's:  Intact  Cognition:  WNL  Sleep:        Treatment Plan Summary: Plan This 40 year old woman with a history of opiate abuse is requesting to be discharged from the hospital [place] of her treatment team. Patient is denying any acute mood symptoms. Denies any psychotic symptoms. Does not appear to be psychotic or confused in her thinking. She completely denies any suicidal ideation and in fact has a plan that is focused on the future and taking care of her children. Patient does not meet commitment criteria. Also she is able to articulate the nature of her condition and the recommended treatment and the risks of not getting the full treatment in her own words. She is able to articulate a plan to get the treatment that is required that while it is not what we would recommend in practice would probably work out if she follows through on it. Therefore she has the capacity to make  decisions for herself. Patient educated about the crucial nature of her getting treatment and of stopping her IV drug abuse. Involuntary commitment discontinued. Patient should follow-up with local mental health and mostly substance abuse treatment.  Disposition: Patient does not meet criteria for psychiatric inpatient admission. Supportive therapy provided about ongoing stressors.  Alethia Berthold, MD 11/20/2015 2:46 PM

## 2015-11-21 LAB — CULTURE, BLOOD (SINGLE): CULTURE: NO GROWTH

## 2015-11-21 LAB — CULTURE, BLOOD (ROUTINE X 2): Culture: NO GROWTH

## 2015-11-23 ENCOUNTER — Encounter: Payer: Self-pay | Admitting: Urgent Care

## 2015-11-23 ENCOUNTER — Emergency Department: Payer: Medicaid Other

## 2015-11-23 DIAGNOSIS — M549 Dorsalgia, unspecified: Secondary | ICD-10-CM | POA: Diagnosis not present

## 2015-11-23 DIAGNOSIS — J45909 Unspecified asthma, uncomplicated: Secondary | ICD-10-CM | POA: Diagnosis not present

## 2015-11-23 DIAGNOSIS — F17210 Nicotine dependence, cigarettes, uncomplicated: Secondary | ICD-10-CM | POA: Diagnosis not present

## 2015-11-23 DIAGNOSIS — R0602 Shortness of breath: Secondary | ICD-10-CM | POA: Insufficient documentation

## 2015-11-23 DIAGNOSIS — F1110 Opioid abuse, uncomplicated: Secondary | ICD-10-CM | POA: Insufficient documentation

## 2015-11-23 DIAGNOSIS — Z5321 Procedure and treatment not carried out due to patient leaving prior to being seen by health care provider: Secondary | ICD-10-CM | POA: Insufficient documentation

## 2015-11-23 DIAGNOSIS — F1490 Cocaine use, unspecified, uncomplicated: Secondary | ICD-10-CM | POA: Insufficient documentation

## 2015-11-23 DIAGNOSIS — R51 Headache: Secondary | ICD-10-CM | POA: Diagnosis present

## 2015-11-23 NOTE — ED Notes (Signed)
Ems pt to lobby via wheelchair, possible polysubstance abuse,  Recently left AMA , for pneumonia , HR 122, BP 113/63 , SATS 97% RA , RR 24

## 2015-11-23 NOTE — ED Notes (Signed)
[place],MD consulted. MD made aware of presenting complaints and triage assessment. MD with [facility] for: CBC, BMP, EKG, CXR. Orders to be entered and carried by this RN.

## 2015-11-23 NOTE — ED Notes (Signed)
Patient informed that MD orders needed to be carried. Patient on the phone, to the bathroom, and is now at the desk asking that we wait because she needs to step outside. Charge nurse aware.

## 2015-11-23 NOTE — ED Notes (Signed)
Patient presents to the desk reporting that she now wishes to stay and be seen in the ED tonight. Originally patient refused blood draw citing that there was "no need" because she was leaving and going to [facility]. ED charge nurse made aware; no beds available at this time; advised to attempt blood draw in triage for protocols.
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
## 2015-11-23 NOTE — ED Notes (Signed)
Patient reports that she is leaving. Does not want to be seen here in the ED; states, "I am going to [facility]. My boyfriend is on his way to get me".

## 2015-11-24 ENCOUNTER — Emergency Department
Admission: EM | Admit: 2015-11-24 | Discharge: 2015-11-24 | Disposition: A | Discharge status: Home / Self Care | Payer: Medicaid Other | Attending: Emergency Medicine | Admitting: Emergency Medicine

## 2015-11-24 NOTE — ED Notes (Addendum)
Patient went over to xray for MD ordered CXR. Patient brought back to the lobby and was observed by other patients going in and out of the bathroom. Finally patient was observed by another patient "staggering" out of the ED. Patient called several times after returning from radiology in order to have other MD orders completed; never answered.

## 2015-11-24 NOTE — ED Notes (Addendum)
Patient has been in the bathroom for an extended amount of time. EDT [place]) sent in to check on patient. Patient was observed coming out of stall and proceeded past staff member and went to drink machine. EDT noted an insulin syringe in the stall that the patient came from; brought syringe to this RN. Syringe noted to be an insulin syringe; 58 units of cloudy brown liquid noted in the syringe. ED charge nurse made aware.

## 2017-07-03 IMAGING — MR MR LUMBAR SPINE W/O CM
5 series · 39 of 48 positions shown · non-contrast
Comparison: CT abdomen and pelvis 04/28/2013.

CLINICAL DATA: Elevated white blood cell count and sepsis. Chest
and back pain. History of drug abuse. No known injury. Initial
encounter.

EXAM:
MRI LUMBAR SPINE WITHOUT CONTRAST
TECHNIQUE: Multiplanar, multisequence MR imaging of the lumbar spine was
performed. No intravenous contrast was administered.

[Series 2: T2 · sagittal · 4.0mm · 0.81mm/px · 6 of 15 slices shown (1 of 2)]
[im 1/15]
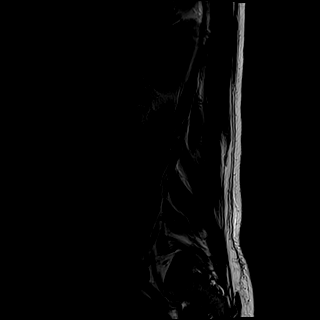
[im 3/15]
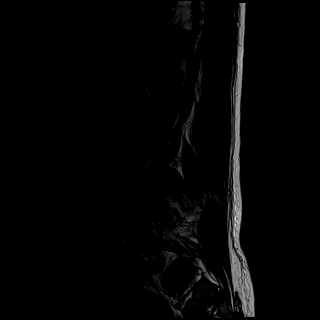
[im 6/15]
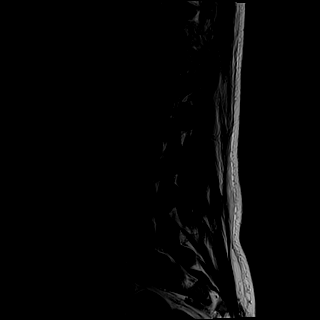
[im 9/15]
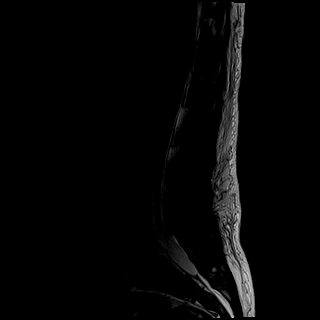
[im 12/15]
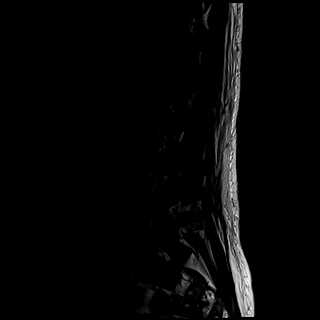
[im 15/15]
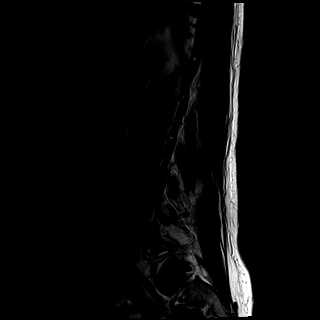

[Series 3: T1 · sagittal · 4.0mm · 0.81mm/px · 6 of 15 slices shown (1 of 2)]
[im 1/15]
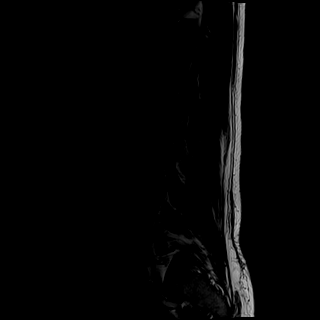
[im 3/15]
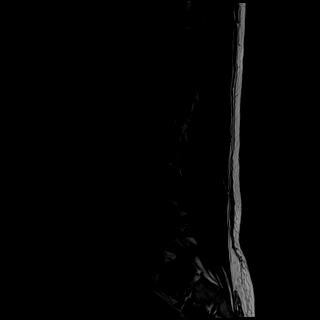
[im 6/15]
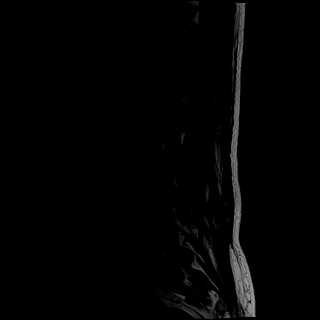
[im 9/15]
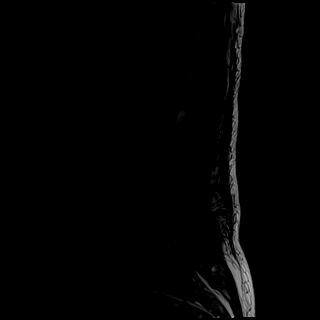
[im 12/15]
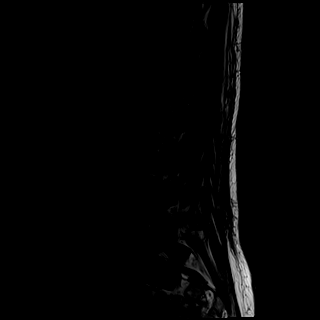
[im 15/15]
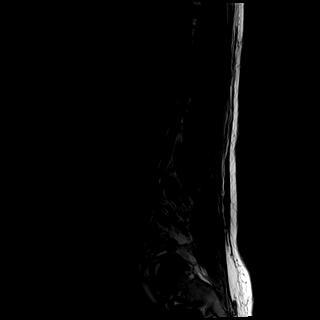

[Series 4: STIR · sagittal · 4.0mm · 1.02mm/px · 6 of 15 slices shown]
[im 1/15]
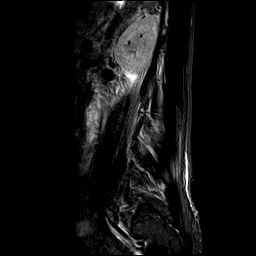
[im 3/15]
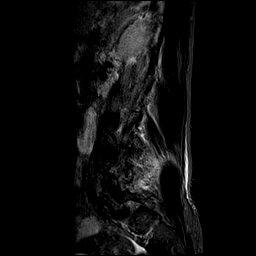
[im 6/15]
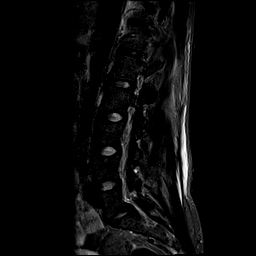
[im 9/15]
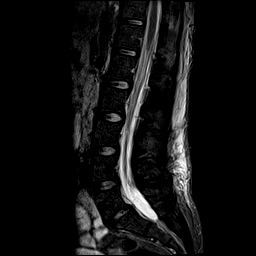
[im 12/15]
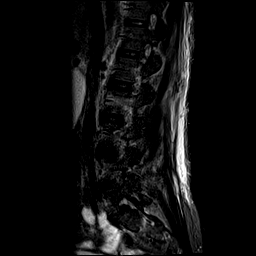
[im 15/15]
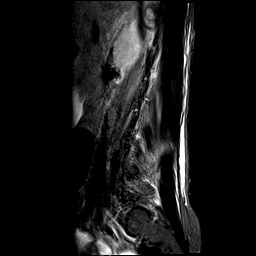

[Series 5: T2 · axial · 4.0mm · 0.78mm/px · z∈[-29,+170]mm · 12 of 37 slices shown (2 of 2)]
[im 1/37]
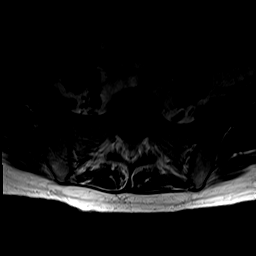
[im 3/37]
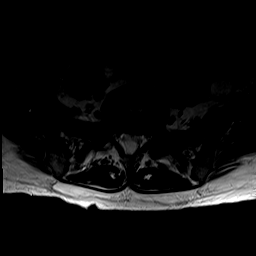
[im 6/37]
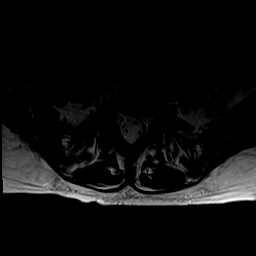
[im 8/37]
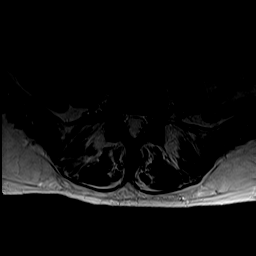
[im 11/37]
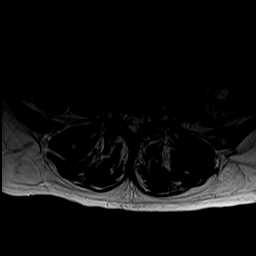
[im 13/37]
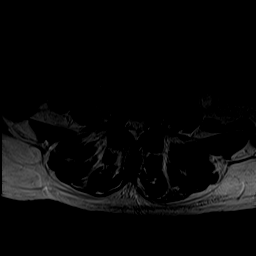
[im 16/37]
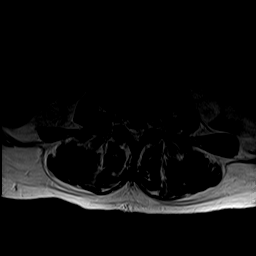
[im 19/37]
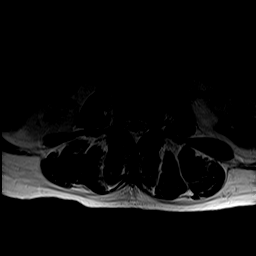
[im 21/37]
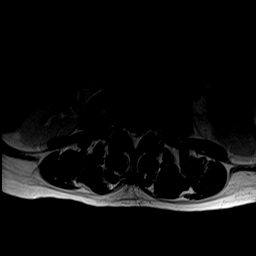
[im 26/37]
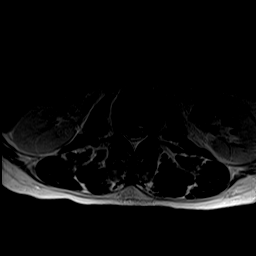
[im 31/37]
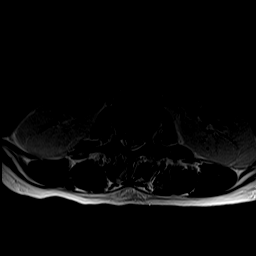
[im 37/37]
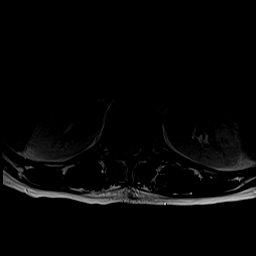

[Series 6: T1 · axial · 4.0mm · 0.39mm/px · z∈[-29,+170]mm · 9 of 37 slices shown (2 of 2)]
[im 1/37]
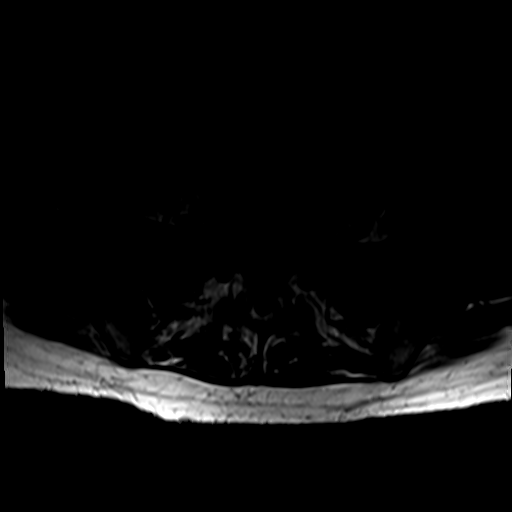
[im 6/37]
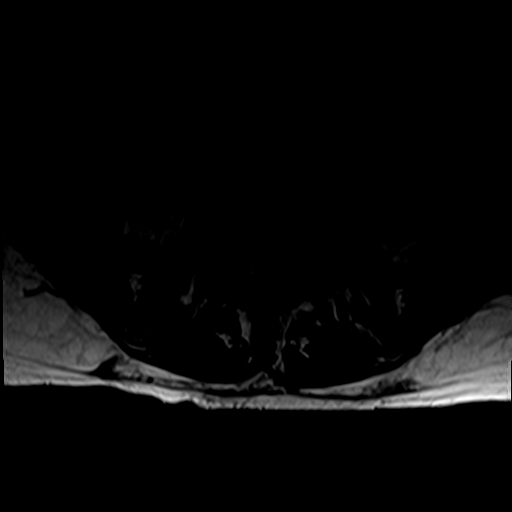
[im 11/37]
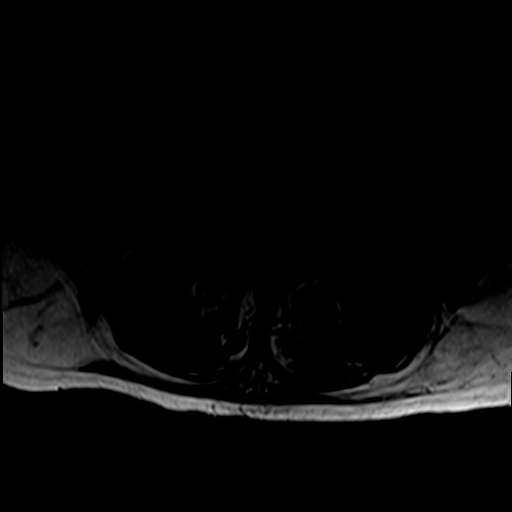
[im 16/37]
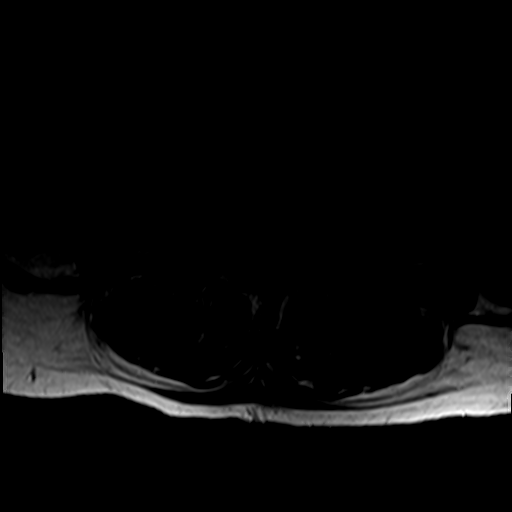
[im 19/37]
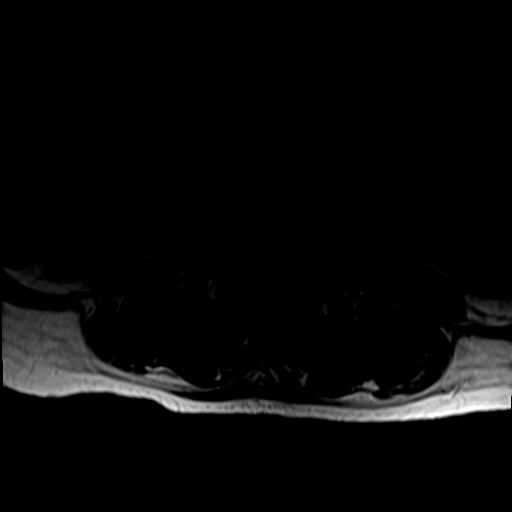
[im 21/37]
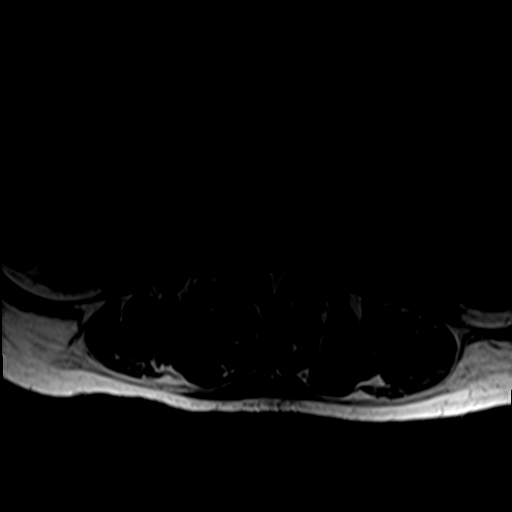
[im 26/37]
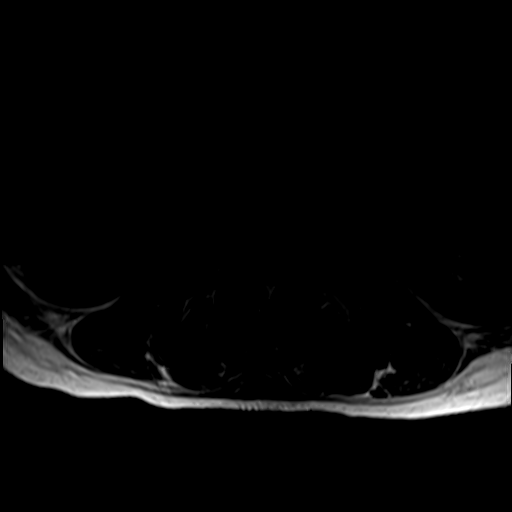
[im 31/37]
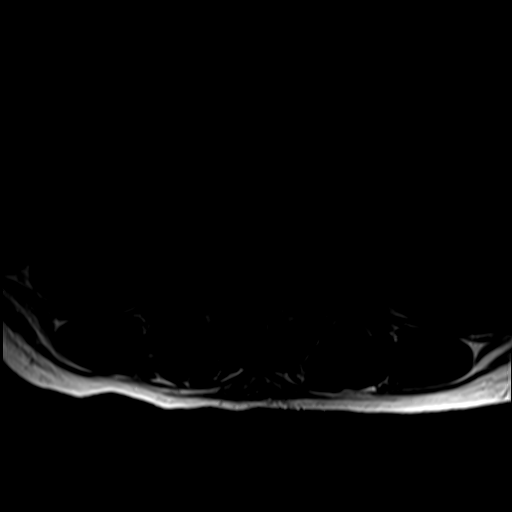
[im 37/37]
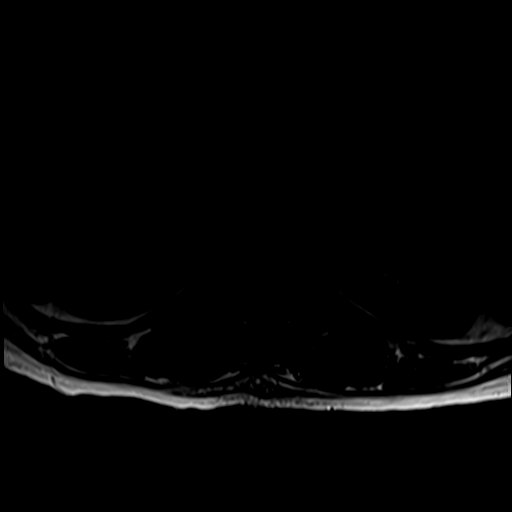

[39 of 48 positions shown; findings below may reference images not displayed]

FINDINGS: Segmentation:  Unremarkable.

Alignment: Bilateral L5 pars interarticularis defects result in
cm anterolisthesis L5 on S1. Otherwise unremarkable.

Vertebrae:  Height and signal are normal.

Conus medullaris: Extends to the T12-L1 level and appears normal.

Paraspinal and other soft tissues: Unremarkable.

Disc levels:

The L1-2 to L4-5 levels are unremarkable.

L5-S1: The disc is uncovered without bulging. The central canal is
widely patent. The foramina are open.
IMPRESSION: No acute abnormality.  Negative for discitis or osteomyelitis.

Bilateral L5 pars interarticularis defects with associated 0.5 cm
anterolisthesis L5 on S1.

Patent central canal and foramina at all levels.

## 2017-07-08 IMAGING — DX DG CHEST 1V PORT
1 series · 1 of 1 positions shown · non-contrast
Comparison: CT scan 11/13/2015

CLINICAL DATA: PICC line placement

EXAM:
PORTABLE CHEST 1 VIEW

[chest ap]
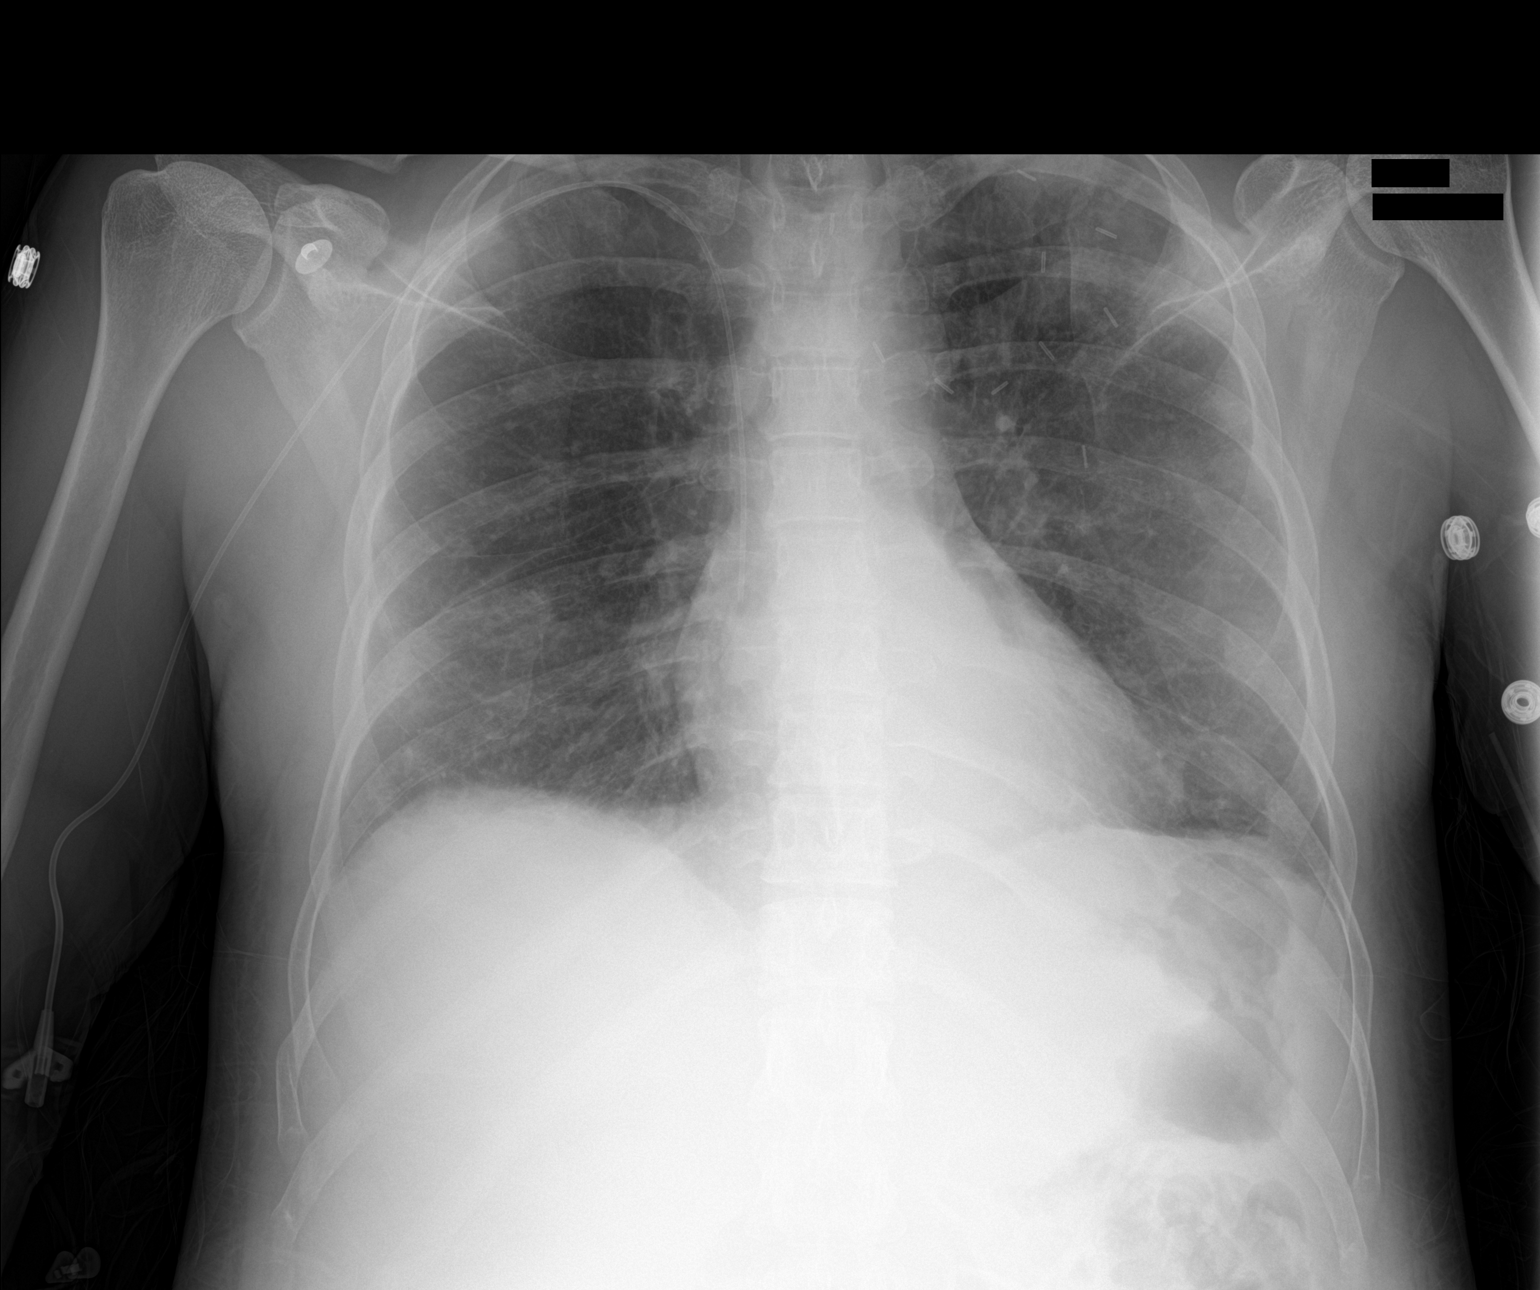

[1 of 1 positions shown; findings below may reference images not displayed]

FINDINGS: Cardiomediastinal silhouette is stable. Multifocal nodular
consolidation again noted. There is right arm PICC line with tip in
upper right atrium. For distal SVC position PICC line should be
retracted about 1.4 cm. No pneumothorax.
IMPRESSION: Right arm PICC line with tip in right atrium. No pneumothorax.
Multifocal nodular consolidation again noted bilaterally.

## 2017-09-20 ENCOUNTER — Emergency Department (HOSPITAL_COMMUNITY): Payer: Self-pay

## 2017-09-20 ENCOUNTER — Encounter (HOSPITAL_COMMUNITY): Payer: Self-pay | Admitting: Emergency Medicine

## 2017-09-20 ENCOUNTER — Inpatient Hospital Stay (HOSPITAL_COMMUNITY)
Admission: EM | Admit: 2017-09-20 | Discharge: 2017-09-24 | DRG: 917 | Discharge status: Against Medical Advice | Payer: Self-pay | Attending: Internal Medicine | Admitting: Internal Medicine

## 2017-09-20 DIAGNOSIS — Z833 Family history of diabetes mellitus: Secondary | ICD-10-CM

## 2017-09-20 DIAGNOSIS — M549 Dorsalgia, unspecified: Secondary | ICD-10-CM | POA: Diagnosis present

## 2017-09-20 DIAGNOSIS — F1410 Cocaine abuse, uncomplicated: Secondary | ICD-10-CM | POA: Diagnosis present

## 2017-09-20 DIAGNOSIS — Z5321 Procedure and treatment not carried out due to patient leaving prior to being seen by health care provider: Secondary | ICD-10-CM | POA: Diagnosis present

## 2017-09-20 DIAGNOSIS — A419 Sepsis, unspecified organism: Secondary | ICD-10-CM | POA: Diagnosis present

## 2017-09-20 DIAGNOSIS — Z791 Long term (current) use of non-steroidal anti-inflammatories (NSAID): Secondary | ICD-10-CM

## 2017-09-20 DIAGNOSIS — L03213 Periorbital cellulitis: Secondary | ICD-10-CM | POA: Diagnosis present

## 2017-09-20 DIAGNOSIS — E162 Hypoglycemia, unspecified: Secondary | ICD-10-CM | POA: Diagnosis present

## 2017-09-20 DIAGNOSIS — F17210 Nicotine dependence, cigarettes, uncomplicated: Secondary | ICD-10-CM | POA: Diagnosis present

## 2017-09-20 DIAGNOSIS — Z800 Family history of malignant neoplasm of digestive organs: Secondary | ICD-10-CM

## 2017-09-20 DIAGNOSIS — Z515 Encounter for palliative care: Secondary | ICD-10-CM

## 2017-09-20 DIAGNOSIS — B958 Unspecified staphylococcus as the cause of diseases classified elsewhere: Secondary | ICD-10-CM | POA: Diagnosis present

## 2017-09-20 DIAGNOSIS — R7881 Bacteremia: Secondary | ICD-10-CM | POA: Diagnosis present

## 2017-09-20 DIAGNOSIS — Z952 Presence of prosthetic heart valve: Secondary | ICD-10-CM

## 2017-09-20 DIAGNOSIS — F1123 Opioid dependence with withdrawal: Secondary | ICD-10-CM | POA: Diagnosis present

## 2017-09-20 DIAGNOSIS — J45909 Unspecified asthma, uncomplicated: Secondary | ICD-10-CM | POA: Diagnosis present

## 2017-09-20 DIAGNOSIS — Z66 Do not resuscitate: Secondary | ICD-10-CM

## 2017-09-20 DIAGNOSIS — F1110 Opioid abuse, uncomplicated: Secondary | ICD-10-CM | POA: Diagnosis present

## 2017-09-20 DIAGNOSIS — F1994 Other psychoactive substance use, unspecified with psychoactive substance-induced mood disorder: Secondary | ICD-10-CM | POA: Diagnosis present

## 2017-09-20 DIAGNOSIS — T401X1A Poisoning by heroin, accidental (unintentional), initial encounter: Principal | ICD-10-CM | POA: Diagnosis present

## 2017-09-20 DIAGNOSIS — G8929 Other chronic pain: Secondary | ICD-10-CM | POA: Diagnosis present

## 2017-09-20 DIAGNOSIS — B1920 Unspecified viral hepatitis C without hepatic coma: Secondary | ICD-10-CM | POA: Diagnosis present

## 2017-09-20 DIAGNOSIS — Z79899 Other long term (current) drug therapy: Secondary | ICD-10-CM

## 2017-09-20 DIAGNOSIS — L03211 Cellulitis of face: Secondary | ICD-10-CM

## 2017-09-20 DIAGNOSIS — Z8679 Personal history of other diseases of the circulatory system: Secondary | ICD-10-CM

## 2017-09-20 DIAGNOSIS — G9341 Metabolic encephalopathy: Secondary | ICD-10-CM | POA: Diagnosis present

## 2017-09-20 DIAGNOSIS — H00031 Abscess of right upper eyelid: Secondary | ICD-10-CM | POA: Diagnosis present

## 2017-09-20 DIAGNOSIS — X58XXXA Exposure to other specified factors, initial encounter: Secondary | ICD-10-CM | POA: Diagnosis present

## 2017-09-20 DIAGNOSIS — Z7189 Other specified counseling: Secondary | ICD-10-CM

## 2017-09-20 LAB — COMPREHENSIVE METABOLIC PANEL
ALT: 19 U/L (ref 14–54)
AST: 35 U/L (ref 15–41)
Albumin: 4.1 g/dL (ref 3.5–5.0)
Alkaline Phosphatase: 62 U/L (ref 38–126)
Anion gap: 14 (ref 5–15)
BUN: 6 mg/dL (ref 6–20)
CHLORIDE: 100 mmol/L — AB (ref 101–111)
CO2: 25 mmol/L (ref 22–32)
CREATININE: 0.76 mg/dL (ref 0.44–1.00)
Calcium: 9.2 mg/dL (ref 8.9–10.3)
Glucose, Bld: 34 mg/dL — CL (ref 65–99)
POTASSIUM: 4.1 mmol/L (ref 3.5–5.1)
SODIUM: 139 mmol/L (ref 135–145)
Total Bilirubin: 1.1 mg/dL (ref 0.3–1.2)
Total Protein: 7.5 g/dL (ref 6.5–8.1)

## 2017-09-20 LAB — CBC WITH DIFFERENTIAL/PLATELET
BASOS ABS: 0.1 10*3/uL (ref 0.0–0.1)
Basophils Relative: 1 %
Eosinophils Absolute: 0.2 10*3/uL (ref 0.0–0.7)
Eosinophils Relative: 2 %
HCT: 48.2 % — ABNORMAL HIGH (ref 36.0–46.0)
Hemoglobin: 15.1 g/dL — ABNORMAL HIGH (ref 12.0–15.0)
LYMPHS ABS: 2.4 10*3/uL (ref 0.7–4.0)
Lymphocytes Relative: 23 %
MCH: 27.9 pg (ref 26.0–34.0)
MCHC: 31.3 g/dL (ref 30.0–36.0)
MCV: 88.9 fL (ref 78.0–100.0)
Monocytes Absolute: 0.7 10*3/uL (ref 0.1–1.0)
Monocytes Relative: 7 %
Neutro Abs: 7.2 10*3/uL (ref 1.7–7.7)
Neutrophils Relative %: 67 %
PLATELETS: 316 10*3/uL (ref 150–400)
RBC: 5.42 MIL/uL — AB (ref 3.87–5.11)
RDW: 16.6 % — AB (ref 11.5–15.5)
WBC: 10.6 10*3/uL — AB (ref 4.0–10.5)

## 2017-09-20 LAB — CBG MONITORING, ED
GLUCOSE-CAPILLARY: 59 mg/dL — AB (ref 65–99)
Glucose-Capillary: 135 mg/dL — ABNORMAL HIGH (ref 65–99)
Glucose-Capillary: 132 mg/dL — ABNORMAL HIGH (ref 65–99)

## 2017-09-20 LAB — RAPID URINE DRUG SCREEN, HOSP PERFORMED
AMPHETAMINES: NOT DETECTED
BENZODIAZEPINES: POSITIVE — AB
Barbiturates: NOT DETECTED
Cocaine: POSITIVE — AB
Opiates: POSITIVE — AB
TETRAHYDROCANNABINOL: NOT DETECTED

## 2017-09-20 LAB — SALICYLATE LEVEL: Salicylate Lvl: <7 mg/dL (ref 2.8–30.0)

## 2017-09-20 LAB — I-STAT BETA HCG BLOOD, ED (MC, WL, AP ONLY)

## 2017-09-20 LAB — I-STAT CG4 LACTIC ACID, ED
LACTIC ACID, VENOUS: 2.08 mmol/L — AB (ref 0.5–1.9)
LACTIC ACID, VENOUS: 0.9 mmol/L (ref 0.5–1.9)

## 2017-09-20 LAB — ETHANOL: Alcohol, Ethyl (B): <10 mg/dL (ref <10)

## 2017-09-20 LAB — ACETAMINOPHEN LEVEL: Acetaminophen (Tylenol), Serum: <10 ug/mL — ABNORMAL LOW (ref 10–30)

## 2017-09-20 MED ORDER — NALOXONE HCL 2 MG/2ML IJ SOSY
PREFILLED_SYRINGE | INTRAMUSCULAR | Status: AC
  Filled 2017-09-20: qty 2

## 2017-09-20 MED ORDER — SODIUM CHLORIDE 0.9 % IV SOLN
1.0000 g | Freq: Three times a day (TID) | INTRAVENOUS | Status: DC
  Administered 2017-09-21 – 2017-09-24 (×10): 1 g via INTRAVENOUS
  Filled 2017-09-20 (×14): qty 1

## 2017-09-20 MED ORDER — THIAMINE HCL 100 MG/ML IJ SOLN
Freq: Once | INTRAVENOUS | Status: AC
  Administered 2017-09-20: 21:00:00 via INTRAVENOUS
  Filled 2017-09-20: qty 1000

## 2017-09-20 MED ORDER — ONDANSETRON HCL 4 MG/2ML IJ SOLN: 4.0000 mg | Freq: Four times a day (QID) | INTRAMUSCULAR | Status: DC | PRN

## 2017-09-20 MED ORDER — VANCOMYCIN HCL IN DEXTROSE 750-5 MG/150ML-% IV SOLN
750.0000 mg | Freq: Two times a day (BID) | INTRAVENOUS | Status: DC
  Administered 2017-09-21 – 2017-09-23 (×5): 750 mg via INTRAVENOUS
  Filled 2017-09-20 (×6): qty 150

## 2017-09-20 MED ORDER — LIDOCAINE 5 % EX PTCH
1.0000 | MEDICATED_PATCH | CUTANEOUS | Status: DC
  Administered 2017-09-20 – 2017-09-23 (×4): 1 via TRANSDERMAL
  Filled 2017-09-20 (×4): qty 1

## 2017-09-20 MED ORDER — VANCOMYCIN HCL IN DEXTROSE 1-5 GM/200ML-% IV SOLN
1000.0000 mg | Freq: Once | INTRAVENOUS | Status: AC
  Administered 2017-09-20: 1000 mg via INTRAVENOUS
  Filled 2017-09-20: qty 200

## 2017-09-20 MED ORDER — LIDOCAINE-EPINEPHRINE (PF) 2 %-1:200000 IJ SOLN
10.0000 mL | Freq: Once | INTRAMUSCULAR | Status: AC
  Administered 2017-09-20: 10 mL
  Filled 2017-09-20: qty 20

## 2017-09-20 MED ORDER — KETOROLAC TROMETHAMINE 15 MG/ML IJ SOLN
15.0000 mg | Freq: Four times a day (QID) | INTRAMUSCULAR | Status: DC | PRN
  Administered 2017-09-21 – 2017-09-24 (×6): 15 mg via INTRAVENOUS
  Filled 2017-09-20 (×7): qty 1

## 2017-09-20 MED ORDER — ALPRAZOLAM 0.5 MG PO TABS
0.5000 mg | ORAL_TABLET | Freq: Three times a day (TID) | ORAL | Status: DC | PRN
  Administered 2017-09-20 – 2017-09-23 (×7): 0.5 mg via ORAL
  Filled 2017-09-20: qty 1
  Filled 2017-09-20: qty 2
  Filled 2017-09-20 (×3): qty 1
  Filled 2017-09-20: qty 2
  Filled 2017-09-20: qty 1

## 2017-09-20 MED ORDER — SODIUM CHLORIDE 0.9 % IV BOLUS
500.0000 mL | Freq: Once | INTRAVENOUS | Status: AC
  Administered 2017-09-20: 500 mL via INTRAVENOUS

## 2017-09-20 MED ORDER — AMITRIPTYLINE HCL 25 MG PO TABS
25.0000 mg | ORAL_TABLET | Freq: Every day | ORAL | Status: DC
  Administered 2017-09-20 – 2017-09-23 (×4): 25 mg via ORAL
  Filled 2017-09-20 (×4): qty 1

## 2017-09-20 MED ORDER — DEXTROSE 50 % IV SOLN
INTRAVENOUS | Status: AC
  Administered 2017-09-20: 50 mL
  Filled 2017-09-20: qty 50

## 2017-09-20 MED ORDER — SENNOSIDES-DOCUSATE SODIUM 8.6-50 MG PO TABS
2.0000 | ORAL_TABLET | Freq: Two times a day (BID) | ORAL | Status: DC
  Administered 2017-09-20 – 2017-09-21 (×2): 2 via ORAL
  Filled 2017-09-20 (×4): qty 2

## 2017-09-20 MED ORDER — SODIUM CHLORIDE 0.9% FLUSH: 10.0000 mL | INTRAVENOUS | Status: DC | PRN

## 2017-09-20 MED ORDER — ENOXAPARIN SODIUM 40 MG/0.4ML ~~LOC~~ SOLN
40.0000 mg | SUBCUTANEOUS | Status: DC
  Filled 2017-09-20: qty 0.4

## 2017-09-20 MED ORDER — IOHEXOL 300 MG/ML  SOLN
75.0000 mL | Freq: Once | INTRAMUSCULAR | Status: AC | PRN
  Administered 2017-09-20: 75 mL via INTRAVENOUS

## 2017-09-20 MED ORDER — ONDANSETRON HCL 4 MG PO TABS: 4.0000 mg | ORAL_TABLET | Freq: Four times a day (QID) | ORAL | Status: DC | PRN

## 2017-09-20 MED ORDER — SODIUM CHLORIDE 0.9 % IV BOLUS
1000.0000 mL | Freq: Once | INTRAVENOUS | Status: AC
  Administered 2017-09-20: 1000 mL via INTRAVENOUS

## 2017-09-20 MED ORDER — DEXTROSE 50 % IV SOLN: 1.0000 | Freq: Once | INTRAVENOUS | Status: DC

## 2017-09-20 MED ORDER — SODIUM CHLORIDE 0.9 % IV SOLN
2.0000 g | Freq: Once | INTRAVENOUS | Status: AC
  Administered 2017-09-20: 2 g via INTRAVENOUS
  Filled 2017-09-20: qty 2

## 2017-09-20 MED ORDER — METHOCARBAMOL 500 MG PO TABS
750.0000 mg | ORAL_TABLET | Freq: Three times a day (TID) | ORAL | Status: DC
  Administered 2017-09-20: 750 mg via ORAL
  Filled 2017-09-20: qty 2

## 2017-09-20 NOTE — H&P (Signed)
History and Physical    Andrea King KGM:[id number] DOB: 12-04-1975 DOA: 09/20/2017  Referring MD/NP/PA: Dr Marily Memos  PCP: Alberteen Spindle, CNM   Patient coming from: home  Chief Complaint: drug overdose  HPI: Andrea King is a 42 y.o. female with medical history significant of polysubstance abuse with previous endocarditis requiring valve replacement as well as history of staph bacteremia was brought into the ER with altered mental status and suspected drug overdose.  She was with her partner who is also being hospitalized. Patient was seen and evaluated.  Her friends found her with altered mental status and brought her to the year.  She is still drowsy and not able to give coherent history.  As part of her evaluation she was found to have periorbital cellulitis that is worrisome.  Patient is being admitted therefore for treatment.  It is not clear what patient took.  ED Course: patient has a temperature of 95.  Drug sceen positive for Benzos, Opiates and Cocaine. Blood count still pending. CT of the orbits show RIGHT periorbital cellulitis and 5 x 10 x 11 mm superficial periorbital abscess. No orbital cellulitis.   Review of Systems: As per HPI otherwise 10 point review of systems negative.    Past Medical History:  Diagnosis Date  . Anemia   . Asthma   . DJD (degenerative joint disease)   . Hepatitis C   . Substance abuse [facility])     Past Surgical History:  Procedure Laterality Date  . INGUINAL HERNIA REPAIR Left 04/28/2013   Procedure: HERNIA REPAIR INGUINAL ADULT with mesh;  Surgeon: Cherylynn Ridges, MD;  Location: [facility] OR;  Service: General;  Laterality: Left;  . TEE WITHOUT CARDIOVERSION N/A 11/18/2015   Procedure: TRANSESOPHAGEAL ECHOCARDIOGRAM (TEE);  Surgeon: Iran Ouch, MD;  Location: [facility] ORS;  Service: Cardiovascular;  Laterality: N/A;  . TUMOR EXCISION     left scalpula     reports that she has been smoking cigarettes.  She has never used smokeless  tobacco. She reports that she has current or past drug history. She reports that she does not drink alcohol.  No Known Allergies  Family History  Problem Relation Age of Onset  . Cancer Mother   . Cancer - Colon Maternal Grandfather   . Diabetes Maternal Grandfather     Prior to Admission medications   Medication Sig Start Date End Date Taking? Authorizing Provider  acetaminophen (TYLENOL) 500 MG tablet Take 1,000 mg by mouth every 6 (six) hours as needed for moderate pain.    [provider]  ALPRAZolam Prudy Feeler) 0.5 MG tablet Take 1 tablet (0.5 mg total) by mouth 3 (three) times daily as needed for anxiety. 11/20/15   Delfino Lovett, MD  amitriptyline (ELAVIL) 25 MG tablet Take 1 tablet (25 mg total) by mouth at bedtime. 11/20/15   Delfino Lovett, MD  HYDROcodone-acetaminophen (NORCO/VICODIN) 5-325 MG tablet Take 2 tablets by mouth every 6 (six) hours as needed for severe pain. 11/20/15   Delfino Lovett, MD  ibuprofen (ADVIL,MOTRIN) 200 MG tablet Take 600 mg by mouth every 6 (six) hours as needed for moderate pain.    [provider]  lidocaine (LIDODERM) 5 % Place 1 patch onto the skin daily. Remove & Discard patch within 12 hours or as directed by MD 11/20/15   Delfino Lovett, MD  methocarbamol (ROBAXIN) 750 MG tablet Take 1 tablet (750 mg total) by mouth 3 (three) times daily. 11/20/15   Delfino Lovett, MD  senna-docusate (  SENOKOT-S) 8.6-50 MG tablet Take 2 tablets by mouth 2 (two) times daily. 11/20/15   Delfino Lovett, MD    Physical Exam: Vitals:   09/20/17 1700 09/20/17 1715 09/20/17 1730 09/20/17 1752  BP: 123/65 124/72 116/78 133/87  Pulse: 66 84 88 88  Resp: Temp:    98.2 F (36.8 C)  TempSrc:    Oral  SpO2: 96% 99% 96% 98%      Constitutional: NAD, calm, comfortable Vitals:   09/20/17 1700 09/20/17 1715 09/20/17 1730 09/20/17 1752  BP: 123/65 124/72 116/78 133/87  Pulse: 66 84 88 88  Resp: Temp:    98.2 F (36.8 C)  TempSrc:    Oral  SpO2:  96% 99% 96% 98%   Eyes: right eye swollen with periorbital cellulitis, swelling and warmth ENMT: Mucous membranes are moist. Posterior pharynx clear of any exudate or lesions.Normal dentition.  Neck: normal, supple, no masses, no thyromegaly Respiratory: clear to auscultation bilaterally, no wheezing, no crackles. Normal respiratory effort. No accessory muscle use.  Cardiovascular: Regular rate and rhythm, no murmurs / rubs / gallops. No extremity edema. 2+ pedal pulses. No carotid bruits.  Abdomen: no tenderness, no masses palpated. No hepatosplenomegaly. Bowel sounds positive.  Musculoskeletal: no clubbing / cyanosis. No joint deformity upper and lower extremities. Good ROM, no contractures. Normal muscle tone.  Skin: no rashes, lesions, ulcers. No induration Neurologic: CN 2-12 grossly intact. Sensation intact, DTR normal. Strength 5/5 in all 4.  Psychiatric: patient is lethargic but arousable   Labs on Admission: I have personally reviewed following labs and imaging studies  CBC: No results for input(s): WBC, NEUTROABS, HGB, HCT, MCV, PLT in the last 168 hours. Basic Metabolic Panel: No results for input(s): NA, K, CL, CO2, GLUCOSE, BUN, CREATININE, CALCIUM, MG, PHOS in the last 168 hours. GFR: CrCl cannot be calculated (Patient's most recent lab result is older than the maximum 21 days allowed.). Liver Function Tests: No results for input(s): AST, ALT, ALKPHOS, BILITOT, PROT, ALBUMIN in the last 168 hours. No results for input(s): LIPASE, AMYLASE in the last 168 hours. No results for input(s): AMMONIA in the last 168 hours. Coagulation Profile: No results for input(s): INR, PROTIME in the last 168 hours. Cardiac Enzymes: No results for input(s): CKTOTAL, CKMB, CKMBINDEX, TROPONINI in the last 168 hours. BNP (last 3 results) No results for input(s): PROBNP in the last 8760 hours. HbA1C: No results for input(s): HGBA1C in the last 72 hours. CBG: Recent Labs  Lab 09/20/17 1744  09/20/17 1827  GLUCAP 59* 135*   Lipid Profile: No results for input(s): CHOL, HDL, LDLCALC, TRIG, CHOLHDL, LDLDIRECT in the last 72 hours. Thyroid Function Tests: No results for input(s): TSH, T4TOTAL, FREET4, T3FREE, THYROIDAB in the last 72 hours. Anemia Panel: No results for input(s): VITAMINB12, FOLATE, FERRITIN, TIBC, IRON, RETICCTPCT in the last 72 hours. Urine analysis:    Component Value Date/Time   COLORURINE YELLOW (A) 11/13/2015 0350   APPEARANCEUR CLEAR (A) 11/13/2015 0350   LABSPEC 1.020 11/13/2015 0350   PHURINE 5.0 11/13/2015 0350   GLUCOSEU NEGATIVE 11/13/2015 0350   HGBUR 1+ (A) 11/13/2015 0350   BILIRUBINUR NEGATIVE 11/13/2015 0350   KETONESUR NEGATIVE 11/13/2015 0350   PROTEINUR 100 (A) 11/13/2015 0350   UROBILINOGEN 0.2 04/27/2013 2330   NITRITE NEGATIVE 11/13/2015 0350   LEUKOCYTESUR NEGATIVE 11/13/2015 0350   Sepsis Labs: (procalcitonin:4,lacticidven:4) )No results found for this or any previous visit (from the past 240 hour(s)).  Radiological Exams on Admission: Dg Chest Port 1 View  Result Date: 09/20/2017 CLINICAL DATA:  Weakness and overdose. History of hepatitis-C and asthma. Current smoker. EXAM: PORTABLE CHEST 1 VIEW COMPARISON:  11/23/2015 FINDINGS: Median sternotomy sutures are noted. Heart size is within normal limits. No aortic aneurysm. No pulmonary consolidation. Surgical clips project over the left upper thorax and base of neck, similar in appearance. Resolution of presumed septic emboli since prior remote study. No new pulmonary consolidation or CHF. Bilateral nipple shadows project over the lung bases simulating pulmonary nodules. No aggressive osseous abnormality. IMPRESSION: No active disease. Electronically Signed   By: Tollie Eth M.D.   On: 09/20/2017 17:01   Ct Orbits W Contrast  Result Date: 09/20/2017 CLINICAL DATA:  RIGHT periorbital cellulitis and drainage for 3 days. Pain. EXAM: CT ORBITS WITH CONTRAST TECHNIQUE:  Multidetector CT images was performed according to the standard protocol following intravenous contrast administration. CONTRAST:  75mL OMNIPAQUE IOHEXOL 300 MG/ML  SOLN COMPARISON:  None. FINDINGS: ORBITS: Intact ocular globes. Lenses are located. Normal appearance of the optic nerve sheath complexes. Preservation of the orbital fat. Normal appearance of the extraocular muscles which are well located. Superior ophthalmic veins are not enlarged. SINUSES: Included paranasal sinuses and mastoid air cells are well aerated. INTRACRANIAL CONTENTS: Normal. OSSEOUS STRUCTURES/ SOFT TISSUES: RIGHT periorbital soft tissue swelling extending to premalar soft tissues. Superimposed 5 x 10 x 11 mm abscess within 1-2 mm of the skin surface at the level of the superior orbital rim. No subcutaneous gas or radiopaque foreign bodies. Mild medial bowing of the LEFT greater than RIGHT medial orbital walls, possible old fractures. Small enhancing reactive RIGHT parotid lymph nodes. IMPRESSION: 1. RIGHT periorbital cellulitis and 5 x 10 x 11 mm superficial periorbital abscess. No orbital cellulitis. Electronically Signed   By: Awilda Metro M.D.   On: 09/20/2017 17:50     Assessment/Plan Principal Problem:   Periorbital cellulitis Active Problems:   Sepsis (HCC)   Substance induced mood disorder (HCC)   Opiate abuse, continuous (HCC)   Hypoglycemia    #1 periorbital cellulitis: Patient had prior history of staphylococcal infection.  She had endocarditis.  We will treat with broad-spectrum antibiotics including vancomycin and cefepime.  Blood cultures will be obtained.  Patient will be in step down unit for close monitoring.  #2 history of polysubstance abuse: Follow-up drug screen today.  Monitor patient for his stools.  CIWA protocol may be activated.  Patient did not respond to narcan.  #3 hypoglycemia: Most likely secondary to overdose.  Poor oral intake as well.  Monitor glucose level and place on D5 water  after banana bag  #4 early sepsis: [facility] for both hypoglycemia and hypothermia continue antibiotics and monitor.   DVT prophylaxis: Lovenox  Code Status: Full  Family Communication: None available  Disposition Plan: to be determined  Consults called: none  Admission status: inpatient  Severity of Illness: The appropriate patient status for this patient is INPATIENT. Inpatient status is judged to be reasonable and necessary in order to provide the required intensity of service to ensure the patient's safety. The patient's presenting symptoms, physical exam findings, and initial radiographic and laboratory data in the context of their chronic comorbidities is felt to place them at high risk for further clinical deterioration. Furthermore, it is not anticipated that the patient will be medically stable for discharge from the hospital within 2 midnights of admission. The following factors support the patient status of inpatient.   " The patient's presenting  symptoms include altered mental status. " The worrisome physical exam findings include right orbital cellulitis. " The initial radiographic and laboratory data are worrisome because of hypothymia. " The chronic co-morbidities include history of IV drug abuse.   * I certify that at the point of admission it is my clinical judgment that the patient will require inpatient hospital care spanning beyond 2 midnights from the point of admission due to high intensity of service, high risk for further deterioration and high frequency of surveillance required.Lonia Blood MD [facility] Pager [phone]  If 7PM-7AM, please contact night-coverage [email or web address] Password [facility]  09/20/2017, 6:31 PM

## 2017-09-20 NOTE — Progress Notes (Signed)
Pharmacy Antibiotic Note  Andrea King is a 42 y.o. female admitted on 09/20/2017 with pneumonia.  Pharmacy has been consulted for vancomycin and cefepime dosing.  Plan: Vancomycin 1gm x 1 then 750 q12 Cefepime 2gm X 1 then 1 gm q8 F/u renal function, cultures and clinical course     Temp (24hrs), Avg:97.5 F (36.4 C), Min:95.5 F (35.3 C), Max:98.8 F (37.1 C)  Recent Labs  Lab 09/20/17 1508 09/20/17 1852 09/20/17 2240  WBC  --   --  10.6*  CREATININE  --   --  0.76  LATICACIDVEN 2.08* 0.90  --     CrCl cannot be calculated (Unknown ideal weight.).    No Known Allergies   Thank you for allowing pharmacy to be a part of this patient's care.  Talbert Cage P[place]et 09/20/2017 11:55 PM

## 2017-09-20 NOTE — ED Provider Notes (Signed)
[facility] EMERGENCY DEPARTMENT Provider Note   CSN: [id number] Arrival date & time: 09/20/17  1214     History   Chief Complaint Chief Complaint  Patient presents with  . Drug Overdose    HPI Andrea King is a 42 y.o. female. Level 5 caveat secondary to altered mental status HPI  42 year old female history of IV drug abuse, endocarditis status post valve replacement presents today with decreased level of consciousness.  Per initial nursing assessment at triage, patient presented with PTR to the ED for assessment after being picked up with her friends [place].  They reported her friend was taken was along for overdose.  Patient was somnolent but easily arousable.  They noted right eye pain with abscess  Past Medical History:  Diagnosis Date  . Anemia   . Asthma   . DJD (degenerative joint disease)   . Hepatitis C   . Substance abuse [facility])     Patient Active Problem List   Diagnosis Date Noted  . Bacteremia due to Staphylococcus   . Sepsis (HCC) 11/13/2015  . Community acquired pneumonia 11/13/2015  . Pneumonia 11/13/2015  . Substance induced mood disorder (HCC) 11/13/2015  . Opiate withdrawal (HCC) 11/13/2015  . Opiate abuse, continuous (HCC) 11/13/2015  . Incarcerated femoral hernia left 04/28/2013    Past Surgical History:  Procedure Laterality Date  . INGUINAL HERNIA REPAIR Left 04/28/2013   Procedure: HERNIA REPAIR INGUINAL ADULT with mesh;  Surgeon: Cherylynn Ridges, MD;  Location: [facility] OR;  Service: General;  Laterality: Left;  . TEE WITHOUT CARDIOVERSION N/A 11/18/2015   Procedure: TRANSESOPHAGEAL ECHOCARDIOGRAM (TEE);  Surgeon: Iran Ouch, MD;  Location: [facility] ORS;  Service: Cardiovascular;  Laterality: N/A;  . TUMOR EXCISION     left scalpula     OB History   None      Home Medications    Prior to Admission medications   Medication Sig Start Date End Date Taking? Authorizing Provider  acetaminophen (TYLENOL)  500 MG tablet Take 1,000 mg by mouth every 6 (six) hours as needed for moderate pain.    [provider]  ALPRAZolam Prudy Feeler) 0.5 MG tablet Take 1 tablet (0.5 mg total) by mouth 3 (three) times daily as needed for anxiety. 11/20/15   Delfino Lovett, MD  amitriptyline (ELAVIL) 25 MG tablet Take 1 tablet (25 mg total) by mouth at bedtime. 11/20/15   Delfino Lovett, MD  HYDROcodone-acetaminophen (NORCO/VICODIN) 5-325 MG tablet Take 2 tablets by mouth every 6 (six) hours as needed for severe pain. 11/20/15   Delfino Lovett, MD  ibuprofen (ADVIL,MOTRIN) 200 MG tablet Take 600 mg by mouth every 6 (six) hours as needed for moderate pain.    [provider]  lidocaine (LIDODERM) 5 % Place 1 patch onto the skin daily. Remove & Discard patch within 12 hours or as directed by MD 11/20/15   Delfino Lovett, MD  methocarbamol (ROBAXIN) 750 MG tablet Take 1 tablet (750 mg total) by mouth 3 (three) times daily. 11/20/15   Delfino Lovett, MD  senna-docusate (SENOKOT-S) 8.6-50 MG tablet Take 2 tablets by mouth 2 (two) times daily. 11/20/15   Delfino Lovett, MD    Family History Family History  Problem Relation Age of Onset  . Cancer Mother   . Cancer - Colon Maternal Grandfather   . Diabetes Maternal Grandfather     Social History Social History   Tobacco Use  . Smoking status: Current Every Day Smoker    Types:  Cigarettes  . Smokeless tobacco: Never Used  Substance Use Topics  . Alcohol use: No    Alcohol/week: 0.0 oz  . Drug use: Yes    Comment: cocaine,Heroine     Allergies   Patient has no known allergies.   Review of Systems Review of Systems  All other systems reviewed and are negative.    Physical Exam Updated Vital Signs BP 119/66   Pulse 83   Temp (!) 95.5 F (35.3 C) (Rectal)   Resp (!) 9   SpO2 94%   Physical Exam  Constitutional: She appears well-developed and well-nourished. No distress.  HENT:  Head: Normocephalic and atraumatic.  Right Ear: External ear normal.  Left  Ear: External ear normal.  Mucous membranes are dry   Eyes:  Erythema above and around right eye with fluctuance and some discharge noted just below lateral right eyebrow  Nursing note and vitals reviewed.    ED Treatments / Results  Labs (all labs ordered are listed, but only abnormal results are displayed) Labs Reviewed  I-STAT CG4 LACTIC ACID, ED - Abnormal; Notable for the following components:      Result Value   Lactic Acid, Venous 2.08 (*)    All other components within normal limits  CULTURE, BLOOD (ROUTINE X 2)  CULTURE, BLOOD (ROUTINE X 2)  SALICYLATE LEVEL  ACETAMINOPHEN LEVEL  ETHANOL  RAPID URINE DRUG SCREEN, HOSP PERFORMED  I-STAT BETA HCG BLOOD, ED ([facility], [facility], [facility] ONLY)  CBG MONITORING, ED  I-STAT BETA HCG BLOOD, ED ([facility], [facility], [facility] ONLY)    EKG EKG Interpretation  Date/Time:  Monday Sep 20 2017 13:37:30 EDT Ventricular Rate:  73 PR Interval:    QRS Duration: 109 QT Interval:  422 QTC Calculation: 465 R Axis:   84 Text Interpretation:  Sinus rhythm RSR' in V1 or V2, probably normal variant Confirmed by Margarita Grizzle [phone]) on 09/20/2017 4:20:37 PM   Radiology No results found.  Procedures .Marland KitchenIncision and Drainage Date/Time: 09/20/2017 4:26 PM Performed by: Margarita Grizzle, MD Authorized by: Margarita Grizzle, MD   Consent:    Consent obtained:  Emergent situation   Risks discussed:  Incomplete drainage and bleeding   Alternatives discussed:  No treatment Location:    Type:  Abscess   Location:  Head   Head location:  R eyelid Pre-procedure details:    Skin preparation:  Betadine Anesthesia (see MAR for exact dosages):    Anesthesia method:  Local infiltration   Local anesthetic:  Lidocaine 1% WITH epi Procedure type:    Complexity:  Simple Procedure details:    Needle aspiration: no     Incision types:  Stab incision and single straight   Incision depth:  Submucosal   Scalpel blade:  11   Wound management:  Irrigated with saline   Drainage:   Bloody   Drainage amount:  Scant   Wound treatment:  Wound left open Post-procedure details:    Patient tolerance of procedure:  Tolerated well, no immediate complications   (including critical care time)  Medications Ordered in ED Medications  naloxone (NARCAN) 2 MG/2ML injection (has no administration in time range)  sodium chloride 0.9 % bolus 1,000 mL (1,000 mLs Intravenous New Bag/Given 09/20/17 1456)  vancomycin (VANCOCIN) IVPB 1000 mg/200 mL premix (1,000 mg Intravenous New Bag/Given 09/20/17 1533)  lidocaine-EPINEPHrine (XYLOCAINE W/EPI) 2 %-1:200000 (PF) injection 10 mL (10 mLs Infiltration Given 09/20/17 1455)     Initial Impression / Assessment and Plan / ED Course  I have reviewed  the triage vital signs and the nursing notes.  Pertinent labs & imaging results that were available during my care of the patient were reviewed by me and considered in my medical decision making (see chart for details).     Patient with ams c.w. Narcotic overdose- uds pending. Patient with decreased loc but oxygenating.  Consider narcan initially but patient woke up more and did not want.  She is now more somnolent and less arousable.  Close monitoring of cardiorespiratory status and would consider narcan if worse, attempt ct prior to reversal.  Orbital cellulitis-preseptal vs periorbital- ct pending- vancomycin given. I and d performed without d/c  Discussed with Dr. Clayborne Dana and he will follow up results and disposition  Final Clinical Impressions(s) / ED Diagnoses   Final diagnoses:  Accidental overdose of heroin, initial encounter [facility])  Facial cellulitis    ED Discharge Orders    None       Margarita Grizzle, MD 09/20/17 [phone]

## 2017-09-20 NOTE — ED Provider Notes (Signed)
6:19 PM Assumed care from Dr. Rosalia Hammers, please see their note for full history, physical and decision making until this point. In brief this is a 42 y.o. year old female who presented to the ED tonight with Drug Overdose     Labs, studies and imaging reviewed by myself and considered in medical decision making if ordered. Imaging interpreted by radiology.  Labs Reviewed  ACETAMINOPHEN LEVEL - Abnormal; Notable for the following components:      Result Value   Acetaminophen (Tylenol), Serum <10 (*)    All other components within normal limits  I-STAT CG4 LACTIC ACID, ED - Abnormal; Notable for the following components:   Lactic Acid, Venous 2.08 (*)    All other components within normal limits  CBG MONITORING, ED - Abnormal; Notable for the following components:   Glucose-Capillary 59 (*)    All other components within normal limits  CULTURE, BLOOD (ROUTINE X 2)  CULTURE, BLOOD (ROUTINE X 2)  SALICYLATE LEVEL  ETHANOL  RAPID URINE DRUG SCREEN, HOSP PERFORMED  CBC WITH DIFFERENTIAL/PLATELET  COMPREHENSIVE METABOLIC PANEL  I-STAT BETA HCG BLOOD, ED ([facility], [facility], [facility] ONLY)  I-STAT BETA HCG BLOOD, ED ([facility], [facility], [facility] ONLY)  I-STAT CG4 LACTIC ACID, ED    CT Orbits W Contrast  Final Result    DG Chest Port 1 View  Final Result      No follow-ups on file.   Physical Exam  BP 133/87 (BP Location: Right Arm)   Pulse 88   Temp 98.2 F (36.8 C) (Oral)   Resp 15   LMP 09/20/2017   SpO2 98%   Physical Exam  ED Course/Procedures     Procedures  MDM   Patient is here for altered mental status thought to be likely secondary to drug use.  However she is also found to have what looks like preseptal cellulitis around her eyes she is been given antibiotics already.  She has some delay in work-up and culture secondary to difficulty in the line but she has one now.  She is pending her CT scan and then will need admission secondary to the extent of infection.  I&D attempted with some return.  T  scan confirms periorbital cellulitis.  Still some residual fluid collection but on my reevaluation the patient there is some purulent fluid draining from the previous incision.  Suspect this will continue to drain.  Admitted to medicine for further management of sleepiness and possible sepsis.         Camy Leder, Barbara Cower, MD 09/21/17 [phone]

## 2017-09-20 NOTE — ED Notes (Signed)
Fluids aznd Vanc changed to midline

## 2017-09-20 NOTE — ED Notes (Signed)
Warm blankets applied

## 2017-09-20 NOTE — ED Notes (Signed)
Patient continues to need to be roused, but oriented when waking

## 2017-09-20 NOTE — ED Triage Notes (Signed)
Patient presents with PTAR to ED for assessment after being picked up with her friends on [facility] with her friend who was taken to [employer] for OD.  Patient AxO but somnolent.  Easily rousable, VSS.  Pt c/o right eye pain with abscess noted.

## 2017-09-20 NOTE — ED Notes (Signed)
IV team at bedside to draw labs

## 2017-09-20 NOTE — ED Notes (Signed)
Patient transported to CT 

## 2017-09-20 NOTE — ED Notes (Signed)
Pt stated she did not want to do the visual acuity screening.

## 2017-09-20 NOTE — ED Notes (Addendum)
Pt rouses breifly then returns to sleep with snoring respirations.Does not respond to attempt for visual accuity.

## 2017-09-21 ENCOUNTER — Other Ambulatory Visit: Payer: Self-pay

## 2017-09-21 DIAGNOSIS — T401X1A Poisoning by heroin, accidental (unintentional), initial encounter: Principal | ICD-10-CM

## 2017-09-21 LAB — CBC
HCT: 41.4 % (ref 36.0–46.0)
HEMOGLOBIN: 13.1 g/dL (ref 12.0–15.0)
MCH: 27.5 pg (ref 26.0–34.0)
MCHC: 31.6 g/dL (ref 30.0–36.0)
MCV: 87 fL (ref 78.0–100.0)
Platelets: 304 10*3/uL (ref 150–400)
RBC: 4.76 MIL/uL (ref 3.87–5.11)
RDW: 16.2 % — ABNORMAL HIGH (ref 11.5–15.5)
WBC: 8.2 10*3/uL (ref 4.0–10.5)

## 2017-09-21 LAB — COMPREHENSIVE METABOLIC PANEL
ALT: 10 U/L — AB (ref 14–54)
ANION GAP: 9 (ref 5–15)
AST: 15 U/L (ref 15–41)
Albumin: 2.8 g/dL — ABNORMAL LOW (ref 3.5–5.0)
Alkaline Phosphatase: 45 U/L (ref 38–126)
BUN: <5 mg/dL — ABNORMAL LOW (ref 6–20)
CHLORIDE: 106 mmol/L (ref 101–111)
CO2: 24 mmol/L (ref 22–32)
CREATININE: 0.65 mg/dL (ref 0.44–1.00)
Calcium: 7.8 mg/dL — ABNORMAL LOW (ref 8.9–10.3)
GFR calc non Af Amer: >60 mL/min (ref >60)
Glucose, Bld: 70 mg/dL (ref 65–99)
POTASSIUM: 3.5 mmol/L (ref 3.5–5.1)
SODIUM: 139 mmol/L (ref 135–145)
Total Bilirubin: 0.7 mg/dL (ref 0.3–1.2)
Total Protein: 5.7 g/dL — ABNORMAL LOW (ref 6.5–8.1)

## 2017-09-21 LAB — CBG MONITORING, ED: GLUCOSE-CAPILLARY: 120 mg/dL — AB (ref 65–99)

## 2017-09-21 LAB — HIV ANTIBODY (ROUTINE TESTING W REFLEX): HIV Screen 4th Generation wRfx: NONREACTIVE

## 2017-09-21 MED ORDER — IBUPROFEN 200 MG PO TABS
400.0000 mg | ORAL_TABLET | Freq: Four times a day (QID) | ORAL | Status: AC | PRN
  Administered 2017-09-21 – 2017-09-23 (×2): 400 mg via ORAL
  Filled 2017-09-21: qty 2
  Filled 2017-09-21: qty 1

## 2017-09-21 MED ORDER — ENOXAPARIN SODIUM 40 MG/0.4ML ~~LOC~~ SOLN
40.0000 mg | SUBCUTANEOUS | Status: DC
  Filled 2017-09-21 (×2): qty 0.4

## 2017-09-21 MED ORDER — ACETAMINOPHEN 325 MG PO TABS: 650.0000 mg | ORAL_TABLET | Freq: Four times a day (QID) | ORAL | Status: DC | PRN

## 2017-09-21 NOTE — ED Notes (Signed)
Pt given warm compress for right eye 

## 2017-09-21 NOTE — ED Notes (Signed)
Attempted to call report x2

## 2017-09-21 NOTE — Progress Notes (Signed)
PROGRESS NOTE    Andrea King  WUJ:[id number] DOB: 1975/05/07 DOA: 09/20/2017 PCP: Alberteen Spindle, CNM  Brief Narrative: 42 year old female with polysubstance abuse, IVDA, history of tricuspid valve endocarditis status post repair, admitted with decreased responsiveness, drug overdose, also found to have periorbital cellulitis and a small 10 x 11 mm superficial right upper eyelid abscess which is draining.   Assessment & Plan:     Metabolic encephalopathy -Due to drug overdose, resolved -IV heroin abuser, not forthcoming with history, UDS also positive for cocaine and benzos -Follow-up blood cultures   Right periorbital cellulitis with small superficial upper eyelid abscess -very small and spontaneously draining, CT without orbital cellulitis -Continue IV vancomycin and cefepime -called and and discussed with ophthalmologist on call Dr.Mincey he recommended Abx, warm compresses and FU in office 3days after DC   polysubstance abuse, IV drug abuse -demanding narcotics which I have refused, use NSAIDs PRN -And has multiple track sites in arms and legs which patient reports are from bedbugs  Prior history of tricuspid valve repair/Endocarditis -Follow-up blood cultures   DVT prophylaxis: add lovenox Code Status: FUll COde Family Communication: None at bedside Disposition Plan: Home pending improvement inand periorbital cellulitis and evaluation for bacteremia  Consultants:      Procedures:   Antimicrobials:  Antibiotics Given (last 72 hours)    Date/Time Action Medication Dose Rate   09/20/17 1533 New Bag/Given   vancomycin (VANCOCIN) IVPB 1000 mg/200 mL premix 1,000 mg 200 mL/hr   09/20/17 1938 New Bag/Given   ceFEPIme (MAXIPIME) 2 g in sodium chloride 0.9 % 100 mL IVPB 2 g 200 mL/hr   09/21/17 0228 New Bag/Given   vancomycin (VANCOCIN) IVPB 750 mg/150 ml premix 750 mg 150 mL/hr   09/21/17 0335 New Bag/Given   ceFEPIme (MAXIPIME) 1 g in sodium chloride 0.9 %  100 mL IVPB 1 g 200 mL/hr      Subjective: -wants pain meds -Reports that her right upper eyelid abscess has been draining since yesterday Objective: Vitals:   09/21/17 0300 09/21/17 0700 09/21/17 0715 09/21/17 0730  BP: 108/75 111/77    Pulse: 70 89 83 85  Resp: 11     Temp:      TempSrc:      SpO2: 98%  93% 94%    Intake/Output Summary (Last 24 hours) at 09/21/2017 1026 Last data filed at 09/21/2017 0610 Gross per 24 hour  Intake 350 ml  Output -  Net 350 ml   There were no vitals filed for this visit.  Examination:  General exam: Appears calm and comfortable, no distress HEENT: Right upper eyelid with erythema, small less than 1 cm oblong abscess with purulent drainage, extraocular movements intact and nonpainful Respiratory system: but able movement, decreased at bases Cardiovascular system: S1 & S2 heard, RRR.   Gastrointestinal system: Abdomen is nondistended, soft and nontender.Normal bowel sounds heard. Central nervous system: Alert and oriented. No focal neurological deficits. Extremities: Symmetric 5 x 5 power. Skin: track sites, tattoos, a small area of erythema and Psychiatry: flat affect     Data Reviewed:   CBC: Recent Labs  Lab 09/20/17 2240 09/21/17 0325  WBC 10.6* 8.2  NEUTROABS 7.2  --   HGB 15.1* 13.1  HCT 48.2* 41.4  MCV 88.9 87.0  PLT 316 304   Basic Metabolic Panel: Recent Labs  Lab 09/20/17 2240 09/21/17 0325  NA 139 139  K 4.1 3.5  CL 100* 106  CO2 25 24  GLUCOSE 34* 70  BUN  6 <5*  CREATININE 0.76 0.65  CALCIUM 9.2 7.8*   GFR: CrCl cannot be calculated (Unknown ideal weight.). Liver Function Tests: Recent Labs  Lab 09/20/17 2240 09/21/17 0325  AST 35 15  ALT 19 10*  ALKPHOS 62 45  BILITOT 1.1 0.7  PROT 7.5 5.7*  ALBUMIN 4.1 2.8*   No results for input(s): LIPASE, AMYLASE in the last 168 hours. No results for input(s): AMMONIA in the last 168 hours. Coagulation Profile: No results for input(s): INR, PROTIME in  the last 168 hours. Cardiac Enzymes: No results for input(s): CKTOTAL, CKMB, CKMBINDEX, TROPONINI in the last 168 hours. BNP (last 3 results) No results for input(s): PROBNP in the last 8760 hours. HbA1C: No results for input(s): HGBA1C in the last 72 hours. CBG: Recent Labs  Lab 09/20/17 1744 09/20/17 1827 09/20/17 2307  GLUCAP 59* 135* 132*   Lipid Profile: No results for input(s): CHOL, HDL, LDLCALC, TRIG, CHOLHDL, LDLDIRECT in the last 72 hours. Thyroid Function Tests: No results for input(s): TSH, T4TOTAL, FREET4, T3FREE, THYROIDAB in the last 72 hours. Anemia Panel: No results for input(s): VITAMINB12, FOLATE, FERRITIN, TIBC, IRON, RETICCTPCT in the last 72 hours. Urine analysis:    Component Value Date/Time   COLORURINE YELLOW (A) 11/13/2015 0350   APPEARANCEUR CLEAR (A) 11/13/2015 0350   LABSPEC 1.020 11/13/2015 0350   PHURINE 5.0 11/13/2015 0350   GLUCOSEU NEGATIVE 11/13/2015 0350   HGBUR 1+ (A) 11/13/2015 0350   BILIRUBINUR NEGATIVE 11/13/2015 0350   KETONESUR NEGATIVE 11/13/2015 0350   PROTEINUR 100 (A) 11/13/2015 0350   UROBILINOGEN 0.2 04/27/2013 2330   NITRITE NEGATIVE 11/13/2015 0350   LEUKOCYTESUR NEGATIVE 11/13/2015 0350   Sepsis Labs: (procalcitonin:4,lacticidven:4)  )No results found for this or any previous visit (from the past 240 hour(s)).       Radiology Studies: Dg Chest Port 1 View  Result Date: 09/20/2017 CLINICAL DATA:  Weakness and overdose. History of hepatitis-C and asthma. Current smoker. EXAM: PORTABLE CHEST 1 VIEW COMPARISON:  11/23/2015 FINDINGS: Median sternotomy sutures are noted. Heart size is within normal limits. No aortic aneurysm. No pulmonary consolidation. Surgical clips project over the left upper thorax and base of neck, similar in appearance. Resolution of presumed septic emboli since prior remote study. No new pulmonary consolidation or CHF. Bilateral nipple shadows project over the lung bases simulating  pulmonary nodules. No aggressive osseous abnormality. IMPRESSION: No active disease. Electronically Signed   By: Tollie Eth M.D.   On: 09/20/2017 17:01   Ct Orbits W Contrast  Result Date: 09/20/2017 CLINICAL DATA:  RIGHT periorbital cellulitis and drainage for 3 days. Pain. EXAM: CT ORBITS WITH CONTRAST TECHNIQUE: Multidetector CT images was performed according to the standard protocol following intravenous contrast administration. CONTRAST:  75mL OMNIPAQUE IOHEXOL 300 MG/ML  SOLN COMPARISON:  None. FINDINGS: ORBITS: Intact ocular globes. Lenses are located. Normal appearance of the optic nerve sheath complexes. Preservation of the orbital fat. Normal appearance of the extraocular muscles which are well located. Superior ophthalmic veins are not enlarged. SINUSES: Included paranasal sinuses and mastoid air cells are well aerated. INTRACRANIAL CONTENTS: Normal. OSSEOUS STRUCTURES/ SOFT TISSUES: RIGHT periorbital soft tissue swelling extending to premalar soft tissues. Superimposed 5 x 10 x 11 mm abscess within 1-2 mm of the skin surface at the level of the superior orbital rim. No subcutaneous gas or radiopaque foreign bodies. Mild medial bowing of the LEFT greater than RIGHT medial orbital walls, possible old fractures. Small enhancing reactive RIGHT parotid lymph nodes. IMPRESSION: 1. RIGHT periorbital  cellulitis and 5 x 10 x 11 mm superficial periorbital abscess. No orbital cellulitis. Electronically Signed   By: Awilda Metro M.D.   On: 09/20/2017 17:50        Scheduled Meds: . amitriptyline  25 mg Oral QHS  . dextrose  1 ampule Intravenous Once  . lidocaine  1 patch Transdermal Q24H  . senna-docusate  2 tablet Oral BID   Continuous Infusions: . ceFEPime (MAXIPIME) IV Stopped (09/21/17 0610)  . vancomycin Stopped (09/21/17 0335)     LOS: 1 day    Time spent:    Zannie Cove, MD [facility] Page via [email or web address], password [email or web address] After 7PM please contact  night-coverage  09/21/2017, 10:26 AM

## 2017-09-22 DIAGNOSIS — L03211 Cellulitis of face: Secondary | ICD-10-CM

## 2017-09-22 DIAGNOSIS — Z515 Encounter for palliative care: Secondary | ICD-10-CM

## 2017-09-22 DIAGNOSIS — Z7189 Other specified counseling: Secondary | ICD-10-CM

## 2017-09-22 DIAGNOSIS — G9341 Metabolic encephalopathy: Secondary | ICD-10-CM

## 2017-09-22 LAB — CBC
HEMATOCRIT: 41 % (ref 36.0–46.0)
Hemoglobin: 13.1 g/dL (ref 12.0–15.0)
MCH: 27.6 pg (ref 26.0–34.0)
MCHC: 32 g/dL (ref 30.0–36.0)
MCV: 86.5 fL (ref 78.0–100.0)
PLATELETS: 280 10*3/uL (ref 150–400)
RBC: 4.74 MIL/uL (ref 3.87–5.11)
RDW: 15.9 % — ABNORMAL HIGH (ref 11.5–15.5)
WBC: 8.2 10*3/uL (ref 4.0–10.5)

## 2017-09-22 LAB — BASIC METABOLIC PANEL
Anion gap: 9 (ref 5–15)
BUN: 7 mg/dL (ref 6–20)
CHLORIDE: 106 mmol/L (ref 101–111)
CO2: 25 mmol/L (ref 22–32)
Calcium: 8.9 mg/dL (ref 8.9–10.3)
Creatinine, Ser: 0.69 mg/dL (ref 0.44–1.00)
GFR calc Af Amer: >60 mL/min (ref >60)
GFR calc non Af Amer: >60 mL/min (ref >60)
GLUCOSE: 100 mg/dL — AB (ref 65–99)
Potassium: 3.6 mmol/L (ref 3.5–5.1)
SODIUM: 140 mmol/L (ref 135–145)

## 2017-09-22 LAB — MRSA PCR SCREENING: MRSA BY PCR: POSITIVE — AB

## 2017-09-22 MED ORDER — MUPIROCIN 2 % EX OINT: 1.0000 "application " | TOPICAL_OINTMENT | Freq: Two times a day (BID) | CUTANEOUS | Status: DC

## 2017-09-22 MED ORDER — GABAPENTIN 100 MG PO CAPS
100.0000 mg | ORAL_CAPSULE | Freq: Two times a day (BID) | ORAL | Status: DC
  Administered 2017-09-22 – 2017-09-24 (×4): 100 mg via ORAL
  Filled 2017-09-22 (×4): qty 1

## 2017-09-22 MED ORDER — CHLORHEXIDINE GLUCONATE CLOTH 2 % EX PADS: 6.0000 | MEDICATED_PAD | Freq: Every day | CUTANEOUS | Status: DC

## 2017-09-22 NOTE — Progress Notes (Signed)
PROGRESS NOTE    Andrea King  ZOX:[id number] DOB: 02/10/1976 DOA: 09/20/2017 PCP: Alberteen Spindle, CNM   Brief Narrative:   42 y.o. WF PMHx Polysubstance Abuse with previous Endocarditis S/P Tricuspid valve replacement, staph bacteremia, Hepatitis C  Brought into the ER with altered mental status and suspected drug overdose. Periorbital cellulitis and a small 10 x 11 mm superficial right upper eyelid abscess which is draining  She was with her partner who is also being hospitalized. Patient was seen and evaluated.  Her friends found her with altered mental status and brought her to the year.  She is still drowsy and not able to give coherent history.  As part of her evaluation she was found to have periorbital cellulitis that is worrisome.  Patient is being admitted therefore for treatment.  It is not clear what patient took.     Subjective: 5/22, very poor understanding of current medical problems.  States had valve repaired at [facility] 2017 (not replaced) however poor historian given her drug abuse.  Unsure of exact date of last drug use just knows it was this month.  Negative CP, negative S OB, positive chronic back pain per patient was started on gabapentin post surgery 2017.  States was not her that OD on drugs but a friend.    Assessment & Plan:   Principal Problem:   Periorbital cellulitis Active Problems:   Sepsis (HCC)   Substance induced mood disorder (HCC)   Opiate abuse, continuous (HCC)   Hypoglycemia   Metabolic encephalopathy/Heroin overdose -Multifactorial to include drug overdose, bacteremia?  Periorbital cellulitis.   -Resolved   RIGHT periorbital cellulitis/upper eyelid abscess -RIGHT upper eyelid cellulitis/abscess draining purulent fluid covered and clean. - Per epic note Case discussed with Dr. Genia Del ophthalmology recommended antibiotics, warm compresses and follow-up in his office 72 hours after discharge.     Polysubstance abuse/IV drug abuse -5/20  UDS positive benzodiazepine, opiates, cocaine -Multiple track marks on arms, legs/neck -Discussed need to continue antibiotic treatment.  Awaiting blood cultures.  Hx tricuspid valve Repair/Endocarditis  -blood culture pending  Goals of care -5/22 PALLIATIVE CARE: Patient with polysubstance abuse, continued IV drug abuse as/P tricuspid valve repair/endocarditis discuss code changed to DNR     DVT prophylaxis: Lovenox Code Status: Full Family Communication: From present for discussion plan of care Disposition Plan: TBD  Consultants:  Ophthalmologist  Procedures/Significant Events:     I have personally reviewed and interpreted all radiology studies and my findings are as above.  VENTILATOR SETTINGS:    Cultures 5/20 blood NGTD 5/21 MRSA by PCR positive    Antimicrobials: Anti-infectives (From admission, onward)   Start     Stop   09/21/17 0400  ceFEPIme (MAXIPIME) 1 g in sodium chloride 0.9 % 100 mL IVPB         09/21/17 0200  vancomycin (VANCOCIN) IVPB 750 mg/150 ml premix         09/20/17 2000  ceFEPIme (MAXIPIME) 2 g in sodium chloride 0.9 % 100 mL IVPB     09/20/17 2027   09/20/17 1415  vancomycin (VANCOCIN) IVPB 1000 mg/200 mL premix     09/20/17 1643      Devices   LINES / TUBES:      Continuous Infusions: . ceFEPime (MAXIPIME) IV 1 g (09/22/17 0548)  . vancomycin Stopped (09/22/17 0306)     Objective: Vitals:   09/21/17 1726 09/21/17 1947 09/21/17 2315 09/22/17 0431  BP: (!) 117/98 124/77 108/65 114/80  Pulse:  68 (!)  59 64  Resp: Temp: 98 F (36.7 C) 98.2 F (36.8 C) 98 F (36.7 C) 98.2 F (36.8 C)  TempSrc: Oral Oral Oral Oral  SpO2: 100% 97% 98% 99%  Weight: 114 lb 3.2 oz (51.8 kg)     Height:  (1.575 m)       Intake/Output Summary (Last 24 hours) at 09/22/2017 0711 Last data filed at 09/22/2017 0548 Gross per 24 hour  Intake 1450 ml  Output -  Net 1450 ml   Filed Weights   09/21/17 1726  Weight: 114 lb  3.2 oz (51.8 kg)    Examination:  General: A/O x4, No acute respiratory distress, cachectic poor health. Eyes: negative scleral hemorrhage, negative anisocoria, negative icterus, RIGHT eyelid large abscess draining purulent fluid. ENT: Poor dentation, Neck:  Negative scars, masses, torticollis, lymphadenopathy, JVD Lungs: Clear to auscultation bilaterally without wheezes or crackles Cardiovascular: Regular rate and rhythm without murmur gallop or rub normal S1 and S2 Abdomen: negative abdominal pain, nondistended, positive soft, bowel sounds, no rebound, no ascites, no appreciable mass Extremities: No significant cyanosis, clubbing, or edema bilateral lower extremities Skin: Track marks bilateral arms, neck and ear  Psychiatric:  Negative depression, negative anxiety, negative fatigue, negative mania  Central nervous system:  Cranial nerves II through XII intact, tongue/uvula midline, all extremities muscle strength 5/5, sensation intact throughout, negative dysarthria, negative expressive aphasia, negative receptive aphasia.  .     Data Reviewed: Care during the described time interval was provided by me .  I have reviewed this patient's available data, including medical history, events of note, physical examination, and all test results as part of my evaluation.   CBC: Recent Labs  Lab 09/20/17 2240 09/21/17 0325 09/22/17 0458  WBC 10.6* 8.2 8.2  NEUTROABS 7.2  --   --   HGB 15.1* 13.1 13.1  HCT 48.2* 41.4 41.0  MCV 88.9 87.0 86.5  PLT 316 304 280   Basic Metabolic Panel: Recent Labs  Lab 09/20/17 2240 09/21/17 0325 09/22/17 0458  NA 139 139 140  K 4.1 3.5 3.6  CL 100* 106 106  CO2 GLUCOSE 34* 70 100*  BUN 6 <5* 7  CREATININE 0.76 0.65 0.69  CALCIUM 9.2 7.8* 8.9   GFR: Estimated Creatinine Clearance: 71.7 mL/min (by C-G formula based on SCr of 0.69 mg/dL). Liver Function Tests: Recent Labs  Lab 09/20/17 2240 09/21/17 0325  AST 35 15  ALT 19 10*    ALKPHOS 62 45  BILITOT 1.1 0.7  PROT 7.5 5.7*  ALBUMIN 4.1 2.8*   No results for input(s): LIPASE, AMYLASE in the last 168 hours. No results for input(s): AMMONIA in the last 168 hours. Coagulation Profile: No results for input(s): INR, PROTIME in the last 168 hours. Cardiac Enzymes: No results for input(s): CKTOTAL, CKMB, CKMBINDEX, TROPONINI in the last 168 hours. BNP (last 3 results) No results for input(s): PROBNP in the last 8760 hours. HbA1C: No results for input(s): HGBA1C in the last 72 hours. CBG: Recent Labs  Lab 09/20/17 1744 09/20/17 1827 09/20/17 2307 09/21/17 1056  GLUCAP 59* 135* 132* 120*   Lipid Profile: No results for input(s): CHOL, HDL, LDLCALC, TRIG, CHOLHDL, LDLDIRECT in the last 72 hours. Thyroid Function Tests: No results for input(s): TSH, T4TOTAL, FREET4, T3FREE, THYROIDAB in the last 72 hours. Anemia Panel: No results for input(s): VITAMINB12, FOLATE, FERRITIN, TIBC, IRON, RETICCTPCT in the last 72 hours. Urine analysis:    Component Value  Date/Time   COLORURINE YELLOW (A) 11/13/2015 0350   APPEARANCEUR CLEAR (A) 11/13/2015 0350   LABSPEC 1.020 11/13/2015 0350   PHURINE 5.0 11/13/2015 0350   GLUCOSEU NEGATIVE 11/13/2015 0350   HGBUR 1+ (A) 11/13/2015 0350   BILIRUBINUR NEGATIVE 11/13/2015 0350   KETONESUR NEGATIVE 11/13/2015 0350   PROTEINUR 100 (A) 11/13/2015 0350   UROBILINOGEN 0.2 04/27/2013 2330   NITRITE NEGATIVE 11/13/2015 0350   LEUKOCYTESUR NEGATIVE 11/13/2015 0350   Sepsis Labs: (procalcitonin:4,lacticidven:4)  ) Recent Results (from the past 240 hour(s))  Blood culture (routine x 2)     Status: None (Preliminary result)   Collection Time: 09/20/17  2:00 PM  Result Value Ref Range Status   Specimen Description BLOOD LEFT FOREARM  Final   Special Requests   Final    BOTTLES DRAWN AEROBIC ONLY Blood Culture results may not be optimal due to an inadequate volume of blood received in culture bottles   Culture   Final     NO GROWTH < 24 HOURS Performed at [facility], [address], [place], [place] [address]    Report Status PENDING  Incomplete  Blood culture (routine x 2)     Status: None (Preliminary result)   Collection Time: 09/20/17  3:40 PM  Result Value Ref Range Status   Specimen Description BLOOD LEFT ARM  Final   Special Requests   Final    BOTTLES DRAWN AEROBIC AND ANAEROBIC Blood Culture results may not be optimal due to an inadequate volume of blood received in culture bottles   Culture   Final    NO GROWTH < 24 HOURS Performed at [facility], [address], [place], [place] [address]    Report Status PENDING  Incomplete  MRSA PCR Screening     Status: Abnormal   Collection Time: 09/21/17 11:16 PM  Result Value Ref Range Status   MRSA by PCR POSITIVE (A) NEGATIVE Final    Comment:        The GeneXpert MRSA Assay (FDA approved for NASAL specimens only), is one component of a comprehensive MRSA colonization surveillance program. It is not intended to diagnose MRSA infection nor to guide or monitor treatment for MRSA infections. RESULT CALLED TO, READ BACK BY AND VERIFIED WITH: A LOWE RN 09/22/17 0256 JDW Performed at [facility], [address], [place], [place] [address]          Radiology Studies: Dg Chest Port 1 View  Result Date: 09/20/2017 CLINICAL DATA:  Weakness and overdose. History of hepatitis-C and asthma. Current smoker. EXAM: PORTABLE CHEST 1 VIEW COMPARISON:  11/23/2015 FINDINGS: Median sternotomy sutures are noted. Heart size is within normal limits. No aortic aneurysm. No pulmonary consolidation. Surgical clips project over the left upper thorax and base of neck, similar in appearance. Resolution of presumed septic emboli since prior remote study. No new pulmonary consolidation or CHF. Bilateral nipple shadows project over the lung bases simulating pulmonary nodules. No aggressive osseous abnormality. IMPRESSION: No active disease.  Electronically Signed   By: Tollie Eth M.D.   On: 09/20/2017 17:01   Ct Orbits W Contrast  Result Date: 09/20/2017 CLINICAL DATA:  RIGHT periorbital cellulitis and drainage for 3 days. Pain. EXAM: CT ORBITS WITH CONTRAST TECHNIQUE: Multidetector CT images was performed according to the standard protocol following intravenous contrast administration. CONTRAST:  75mL OMNIPAQUE IOHEXOL 300 MG/ML  SOLN COMPARISON:  None. FINDINGS: ORBITS: Intact ocular globes. Lenses are located. Normal appearance of the optic nerve  sheath complexes. Preservation of the orbital fat. Normal appearance of the extraocular muscles which are well located. Superior ophthalmic veins are not enlarged. SINUSES: Included paranasal sinuses and mastoid air cells are well aerated. INTRACRANIAL CONTENTS: Normal. OSSEOUS STRUCTURES/ SOFT TISSUES: RIGHT periorbital soft tissue swelling extending to premalar soft tissues. Superimposed 5 x 10 x 11 mm abscess within 1-2 mm of the skin surface at the level of the superior orbital rim. No subcutaneous gas or radiopaque foreign bodies. Mild medial bowing of the LEFT greater than RIGHT medial orbital walls, possible old fractures. Small enhancing reactive RIGHT parotid lymph nodes. IMPRESSION: 1. RIGHT periorbital cellulitis and 5 x 10 x 11 mm superficial periorbital abscess. No orbital cellulitis. Electronically Signed   By: Awilda Metro M.D.   On: 09/20/2017 17:50        Scheduled Meds: . amitriptyline  25 mg Oral QHS  . Chlorhexidine Gluconate Cloth  6 each Topical Q0600  . dextrose  1 ampule Intravenous Once  . enoxaparin (LOVENOX) injection  40 mg Subcutaneous Q24H  . lidocaine  1 patch Transdermal Q24H  . mupirocin ointment  1 application Nasal BID  . senna-docusate  2 tablet Oral BID   Continuous Infusions: . ceFEPime (MAXIPIME) IV 1 g (09/22/17 0548)  . vancomycin Stopped (09/22/17 0306)     LOS: 2 days    Time spent: 40 minutes    WOODS, Roselind Messier, MD [facility] Pager [phone]   If 7PM-7AM, please contact night-coverage [email or web address] Password [facility] 09/22/2017, 7:11 AM

## 2017-09-22 NOTE — Consult Note (Signed)
Consultation Note Date: 09/22/2017   Patient Name: Andrea King  DOB: 05-04-1976  MRN: [id number]  Age / Sex: 42 y.o., female  PCP: Andrea King, CNM Referring Physician: Allie Bossier, MD  Reason for Consultation: Establishing goals of care  HPI/Patient Profile: 42 y.o. female  with past medical history of polysubstance abuse, endocarditis s/p tricuspid valve replacement, staph bacteremia, and hep C admitted on 09/20/2017 with AMS and suspected drug overdose. Drug screen positive for benzos, opiates, and cocaine. She was also found to have periorbital cellulitis.   PMT consulted to discuss code status.   Clinical Assessment and Goals of Care: I have reviewed medical records including EPIC notes, labs and imaging, received report from RN, assessed the patient and then met at the bedside with patient  to discuss diagnosis prognosis, GOC, EOL wishes, disposition and options.  I introduced Palliative Medicine as specialized medical care for people living with serious illness. It focuses on providing relief from the symptoms and stress of a serious illness. The goal is to improve quality of life for both the patient and the family.  Patient tells me she does not feel like talking. Asks about xanax and gabapentin. RN addressing medication concerns.   Patient agrees to answer short questions. Tells me she would want her brother, Andrea King, to be Air traffic controller. Briefly asked about code status, she elects full code.   Questions and concerns were addressed. The patient was encouraged to call with questions or concerns.   Primary Decision Maker PATIENT  patient requests her brother, Andrea King be her decision maker if she is unable  SUMMARY OF RECOMMENDATIONS   - established surrogate decision maker: brother, Andrea King - will consult chaplain for HCPOA documents - elects full code - patient does not engage in any  further discussion - PMT will sign off, please re-consult if needed  Code Status/Advance Care Planning:  Full code  Symptom Management:   Per primary - PMT does not address acute pain not r/t life limiting illness  Palliative Prophylaxis:   Aspiration, Bowel Regimen, Delirium Protocol, Eye Care, Frequent Pain Assessment, Oral Care and Turn Reposition  Additional Recommendations (Limitations, Scope, Preferences):  Full Scope Treatment  Psycho-social/Spiritual:   Desire for further Chaplaincy support:yes  Additional Recommendations: Referral to Community Resources   Prognosis:   Unable to determine  Discharge Planning: home      Primary Diagnoses: Present on Admission: . Substance induced mood disorder ([place]) . Opiate abuse, continuous ([place]) . Periorbital cellulitis . Hypoglycemia . Sepsis ([place])   I have reviewed the medical record, interviewed the patient and family, and examined the patient. The following aspects are pertinent.  Past Medical History:  Diagnosis Date  . Anemia   . Asthma   . DJD (degenerative joint disease)   . Hepatitis C   . Substance abuse ([place])    Social History   Socioeconomic History  . Marital status: Single    Spouse name: Not on file  . Number of children: Not on file  . Years of education: Not on file  . Highest education level: Not on file  Occupational History  . Occupation: unemployed  Social Needs  . Financial resource strain: Not on file  . Food insecurity:    Worry: Not on file    Inability: Not on file  . Transportation needs:    Medical: Not on file    Non-medical: Not on file  Tobacco Use  . Smoking status: Current Every Day  Smoker    Types: Cigarettes  . Smokeless tobacco: Never Used  Substance and Sexual Activity  . Alcohol use: No    Alcohol/week: 0.0 oz  . Drug use: Yes    Comment: cocaine,Heroine  . Sexual activity: Yes    Birth control/protection: None  Lifestyle  . Physical activity:    Days  per week: Not on file    Minutes per session: Not on file  . Stress: Not on file  Relationships  . Social connections:    Talks on phone: Not on file    Gets together: Not on file    Attends religious service: Not on file    Active member of club or organization: Not on file    Attends meetings of clubs or organizations: Not on file    Relationship status: Not on file  Other Topics Concern  . Not on file  Social History Narrative  . Not on file   Family History  Problem Relation Age of Onset  . Cancer Mother   . Cancer - Colon Maternal Grandfather   . Diabetes Maternal Grandfather    Scheduled Meds: . amitriptyline  25 mg Oral QHS  . Chlorhexidine Gluconate Cloth  6 each Topical Q0600  . dextrose  1 ampule Intravenous Once  . enoxaparin (LOVENOX) injection  40 mg Subcutaneous Q24H  . lidocaine  1 patch Transdermal Q24H  . mupirocin ointment  1 application Nasal BID  . senna-docusate  2 tablet Oral BID   Continuous Infusions: . ceFEPime (MAXIPIME) IV Stopped (09/22/17 1511)  . vancomycin Stopped (09/22/17 0306)   PRN Meds:.acetaminophen, ALPRAZolam, ibuprofen, ketorolac, ondansetron **OR** ondansetron (ZOFRAN) IV, sodium chloride flush No Known Allergies Review of Systems  Musculoskeletal:       Leg pain  Psychiatric/Behavioral: The patient is nervous/anxious.   All other systems reviewed and are negative.   Physical Exam  Constitutional: She is oriented to person, place, and time. She is cooperative. She appears ill.  Eyes:  r eyelid abscess draining purulent fluid  Cardiovascular: Regular rhythm.  Pulmonary/Chest: Effort normal. No accessory muscle usage. No tachypnea. No respiratory distress.  Neurological: She is alert and oriented to person, place, and time.  Skin: Skin is warm and dry.  Psychiatric: She is slowed and withdrawn.    Vital Signs: BP 109/80 (BP Location: Right Arm)   Pulse 64   Temp 98.7 F (37.1 C) (Oral)   Resp 15   Ht '5\' 2"'$  (1.575 m)    Wt 51.8 kg (114 lb 3.2 oz)   LMP 09/20/2017   SpO2 99%   BMI 20.89 kg/m  Pain Scale: 0-10   Pain Score: Asleep   SpO2: SpO2: 99 % O2 Device:SpO2: 99 % O2 Flow Rate: .   IO: Intake/output summary:   Intake/Output Summary (Last 24 hours) at 09/22/2017 1547 Last data filed at 09/22/2017 0548 Gross per 24 hour  Intake 1300 ml  Output -  Net 1300 ml    LBM: Last BM Date: 09/20/17 Baseline Weight: Weight: 51.8 kg (114 lb 3.2 oz) Most recent weight: Weight: 51.8 kg (114 lb 3.2 oz)     Palliative Assessment/Data: PPS 70%     Time Total: 30 minutes Greater than 50%  of this time was spent counseling and coordinating care related to the above assessment and plan.  Juel Burrow, DNP, AGNP-C Palliative Medicine Team [phone]

## 2017-09-22 NOTE — Progress Notes (Signed)
Chaplain visited with patient regarding HCPOA.  Patient stated that she wanted to leave her HCPOA as her father. She states this paperwork has already been done.  Chaplain attempted to call Palliative Care to double check and will try again.  Please call should patient have a change of heart.   Chaplain Darryll Capers [phone]

## 2017-09-22 NOTE — Progress Notes (Signed)
Entered pts room to smell of smoke,  This RN had just left room 10 mins prior to unhook IV so pt could ambulate to bathroom and room did not smell of smoke, returned to pts room smelling strongly of smoke- questioned pt about smoking in room and pt denied smoking. Got second RN to come in room who also smelled smoke and questioned pt who again denied. When we told the pt that due to reason to believe she was smoking in room and would need to get security to search her belongings due to [facility] policy being no smoking she quickly jumped ou of bed and stated she "did not have anymore" when we again asked her if she had been smoking she denied once again, we then told her that we (2 RNs) could search her belongings or we would security she handed over 2 cigarettes, we then asked for a lighter she would be using to light cigarettes and she gave that up as well. When asked again for a fourth time if she had smoked she admitted that she had smoked "only half" and did not have anymore. We told pt that if he again had reason to believe she was smoking or using other drugs we would have to call security.  Primary Md made aware. Will monitor pt.

## 2017-09-23 DIAGNOSIS — F1123 Opioid dependence with withdrawal: Secondary | ICD-10-CM

## 2017-09-23 LAB — VANCOMYCIN, TROUGH: Vancomycin Tr: 12 ug/mL — ABNORMAL LOW (ref 15–20)

## 2017-09-23 MED ORDER — LOPERAMIDE HCL 2 MG PO CAPS
2.0000 mg | ORAL_CAPSULE | ORAL | Status: DC | PRN
  Administered 2017-09-23: 2 mg via ORAL
  Filled 2017-09-23: qty 1

## 2017-09-23 MED ORDER — LORAZEPAM 1 MG PO TABS
1.0000 mg | ORAL_TABLET | ORAL | Status: DC | PRN
  Administered 2017-09-23 – 2017-09-24 (×4): 1 mg via ORAL
  Filled 2017-09-23 (×4): qty 1

## 2017-09-23 MED ORDER — VANCOMYCIN HCL IN DEXTROSE 1-5 GM/200ML-% IV SOLN
1000.0000 mg | Freq: Two times a day (BID) | INTRAVENOUS | Status: DC
  Administered 2017-09-23 – 2017-09-24 (×2): 1000 mg via INTRAVENOUS
  Filled 2017-09-23 (×2): qty 200

## 2017-09-23 MED ORDER — HYDROXYZINE HCL 25 MG PO TABS
25.0000 mg | ORAL_TABLET | ORAL | Status: DC | PRN
  Administered 2017-09-23 – 2017-09-24 (×4): 25 mg via ORAL
  Filled 2017-09-23 (×4): qty 1

## 2017-09-23 NOTE — Care Management Note (Addendum)
Case Management Note  Patient Details  Name: Andrea King MRN: [id number] Date of Birth: 06-Nov-1975  Subjective/Objective:      Pt admitted with possible drug overdose, PNA           Action/Plan:  PTA independent from home.  CSW consulted for ongoing substance abuse.  Pt currently on IV antibiotics and on withdrawal protocol.  Pt confirmed she does not have PCP nor active insurance.  Pt is a resident of [place] and informed CM that she is willing to go to [place].  CM provided [facility] information for local clinics.  Pt may need MATCH at discharge - CM will continue to follow for discharge needs   Expected Discharge Date:                  Expected Discharge Plan:  Home/Self Care  In-House Referral:  Clinical Social Work  Discharge planning Services  CM Consult  Post Acute Care Choice:    Choice offered to:     DME Arranged:    DME Agency:     HH Arranged:    HH Agency:     Status of Service:     If discussed at [employer], dates discussed:    Additional Comments:  Cherylann Parr, RN 09/23/2017, 3:49 PM

## 2017-09-23 NOTE — Progress Notes (Signed)
Pharmacy Antibiotic Note  Andrea King is a 42 y.o. female admitted on 09/20/2017 with pneumonia.  Pharmacy has been consulted for vancomycin and cefepime dosing.  Scr remains stable at 0.69. WBC WNL. Vancomycin trough 10 hours after last dose came back at 12, subtherapeutic. No growth on blood cx.   Plan: Vancomycin 1 g every 12 hours starting 5/23 at 1600 Continue cefepime 1 gm every 8 hours F/u renal function, cultures and clinical course  Height:  (157.5 cm) Weight: 114 lb 3.2 oz (51.8 kg) IBW/kg (Calculated) : 50.1  Temp (24hrs), Avg:98.2 F (36.8 C), Min:97.8 F (36.6 C), Max:98.4 F (36.9 C)  Recent Labs  Lab 09/20/17 1508 09/20/17 1852 09/20/17 2240 09/21/17 0325 09/22/17 0458 09/23/17 1223  WBC  --   --  10.6* 8.2 8.2  --   CREATININE  --   --  0.76 0.65 0.69  --   LATICACIDVEN 2.08* 0.90  --   --   --   --   VANCOTROUGH  --   --   --   --   --  12*    Estimated Creatinine Clearance: 71.7 mL/min (by C-G formula based on SCr of 0.69 mg/dL).    No Known Allergies   Thank you for allowing pharmacy to be a part of this patient's care.  Girard Cooter, PharmD Clinical Pharmacist  Pager: [phone] Phone: [phone] 09/23/2017 3:24 PM

## 2017-09-23 NOTE — Progress Notes (Signed)
Pt assisted on bedpan by CNA. Bedpan removed from pt by this RN. Per CNA pt stated she voided 2-3 times overnight. However when RN asked pt if she voided pt stated she hasn't voided all night. This RN asked pt if ok to bladder scan her and pt refused. Pt then stated she voided about 2-3 times overnight. However pt not on Telebox and is hooked up to monitor and w/ close monitoring from RN the pt has been in bed sleeping all night when not being assessed or receiving meds. MD made aware, RN will continue to monitor

## 2017-09-23 NOTE — Progress Notes (Addendum)
Upon answering COW protocol questions pt answered "yes" to having diarrhea and stated it was "really bad" PRN imodium  Given at this time for pt report of diarrhea - a few hours later this RN asked when was her last BM pt stated not for a few days.

## 2017-09-23 NOTE — Progress Notes (Signed)
PROGRESS NOTE    Andrea King  WJX:[id number] DOB: 1975/06/19 DOA: 09/20/2017 PCP: Alberteen Spindle, CNM   Brief Narrative:   42 y.o. WF PMHx Polysubstance Abuse with previous Endocarditis S/P Tricuspid valve replacement, staph bacteremia, Hepatitis C  Brought into the ER with altered mental status and suspected drug overdose. Periorbital cellulitis and a small 10 x 11 mm superficial right upper eyelid abscess which is draining  She was with her partner who is also being hospitalized. Patient was seen and evaluated.  Her friends found her with altered mental status and brought her to the year.  She is still drowsy and not able to give coherent history.  As part of her evaluation she was found to have periorbital cellulitis that is worrisome.  Patient is being admitted therefore for treatment.  It is not clear what patient took.     Subjective: 5/23  A/O x4.  Patient requesting medication for withdrawals.  States she is gone through withdrawals many times, now having back pain nausea, runny nose, diarrhea.  At the same time states was not using drugs prior to admission.  In addition tells nurse that she wants to leave AMA. NOTE: RN staff suspects boyfriend is using drugs in the bathroom.    Assessment & Plan:   Principal Problem:   Periorbital cellulitis Active Problems:   Sepsis (HCC)   Substance induced mood disorder (HCC)   Opiate abuse, continuous (HCC)   Hypoglycemia   Palliative care by specialist   Goals of care, counseling/discussion   Metabolic encephalopathy/Heroin overdose -Multifactorial to include drug overdose, bacteremia?  Periorbital cellulitis.   -Resolved   RIGHT periorbital cellulitis/upper eyelid abscess -RIGHT upper eyelid cellulitis/abscess draining purulent fluid covered and clean. - Per epic note Case discussed with Dr. Genia Del ophthalmology recommended antibiotics, warm compresses and follow-up in his office 72 hours after discharge.       Polysubstance abuse/IV drug abuse -5/20 UDS positive benzodiazepine, opiates, cocaine -Multiple track marks on arms, legs/neck -Discussed need to continue antibiotic treatment.  Awaiting blood cultures.  Hx tricuspid valve Repair/Endocarditis  -blood culture pending  Opioid Withdrawal  - Ensure patient on telemetry at all times - Ensure patient on SPO2 monitor all times - Monitor patient using COWS scale below q 4 hr . - Hydroxyzine 25 mg PRN q 4 hr  - Ativan 1 mg PRN q 4 hr -Loperamide 2 mg PRN  -Toradol 15 mg PRN q 6 hr  -On 5/24, depending upon patient's COWS score may start Suboxone          Goals of care -5/22 PALLIATIVE CARE: Patient with polysubstance abuse, continued IV drug abuse as/P tricuspid valve repair/endocarditis discuss code changed to DNR     DVT prophylaxis: Lovenox Code Status: Full Family Communication: From present for discussion plan of care Disposition Plan: TBD  Consultants:  Ophthalmologist  Procedures/Significant Events:     I have personally reviewed and interpreted all radiology studies and my findings are as above.  VENTILATOR SETTINGS:    Cultures 5/20 blood NGTD 5/21 MRSA by PCR positive    Antimicrobials: Anti-infectives (From admission, onward)   Start     Stop   09/21/17 0400  ceFEPIme (MAXIPIME) 1 g in sodium chloride 0.9 % 100 mL IVPB         09/21/17 0200  vancomycin (VANCOCIN) IVPB 750 mg/150 ml premix         09/20/17 2000  ceFEPIme (MAXIPIME) 2 g in sodium chloride 0.9 % 100 mL IVPB  09/20/17 2027   09/20/17 1415  vancomycin (VANCOCIN) IVPB 1000 mg/200 mL premix     09/20/17 1643      Devices   LINES / TUBES:      Continuous Infusions: . ceFEPime (MAXIPIME) IV Stopped (09/23/17 0610)  . vancomycin Stopped (09/23/17 0303)     Objective: Vitals:   09/22/17 1626 09/22/17 1937 09/22/17 2358 09/23/17 0416  BP: 104/66 104/69 127/77 113/68  Pulse:  82    Resp: 15 (!) Temp: 98.3 F  (36.8 C) 98.4 F (36.9 C) 97.8 F (36.6 C) 98.3 F (36.8 C)  TempSrc: Oral Oral Oral Oral  SpO2:  100%    Weight:      Height:        Intake/Output Summary (Last 24 hours) at 09/23/2017 0715 Last data filed at 09/23/2017 0303 Gross per 24 hour  Intake 490 ml  Output -  Net 490 ml   Filed Weights   09/21/17 1726  Weight: 114 lb 3.2 oz (51.8 kg)    Physical Exam:  General: A/O x4, No acute respiratory distress, cachectic, positive rhinorrhea Eyes: negative scleral hemorrhage, negative anisocoria, negative icterus, RIGHT eyelid large abscess negative sign of drainage today slightly improved. Neck:  Negative scars, masses, torticollis, lymphadenopathy, JVD Lungs: Clear to auscultation bilaterally without wheezes or crackles Cardiovascular: Tachycardic, regular rhythm without murmur gallop or rub normal S1 and S2 Abdomen: negative abdominal pain, nondistended, positive soft, bowel sounds, no rebound, no ascites, no appreciable mass Extremities: No significant cyanosis, clubbing, or edema bilateral lower extremities Skin: Multiple track marks bilateral arms, legs, neck  Psychiatric:  Negative depression, negative anxiety, negative fatigue, negative mania, very poor understanding of disease process. Central nervous system:  Cranial nerves II through XII intact, tongue/uvula midline, all extremities muscle strength 5/5, sensation intact throughout,  negative dysarthria, negative expressive aphasia, negative receptive aphasia.    .     Data Reviewed: Care during the described time interval was provided by me .  I have reviewed this patient's available data, including medical history, events of note, physical examination, and all test results as part of my evaluation.   CBC: Recent Labs  Lab 09/20/17 2240 09/21/17 0325 09/22/17 0458  WBC 10.6* 8.2 8.2  NEUTROABS 7.2  --   --   HGB 15.1* 13.1 13.1  HCT 48.2* 41.4 41.0  MCV 88.9 87.0 86.5  PLT 316 304 280   Basic Metabolic  Panel: Recent Labs  Lab 09/20/17 2240 09/21/17 0325 09/22/17 0458  NA 139 139 140  K 4.1 3.5 3.6  CL 100* 106 106  CO2 GLUCOSE 34* 70 100*  BUN 6 <5* 7  CREATININE 0.76 0.65 0.69  CALCIUM 9.2 7.8* 8.9   GFR: Estimated Creatinine Clearance: 71.7 mL/min (by C-G formula based on SCr of 0.69 mg/dL). Liver Function Tests: Recent Labs  Lab 09/20/17 2240 09/21/17 0325  AST 35 15  ALT 19 10*  ALKPHOS 62 45  BILITOT 1.1 0.7  PROT 7.5 5.7*  ALBUMIN 4.1 2.8*   No results for input(s): LIPASE, AMYLASE in the last 168 hours. No results for input(s): AMMONIA in the last 168 hours. Coagulation Profile: No results for input(s): INR, PROTIME in the last 168 hours. Cardiac Enzymes: No results for input(s): CKTOTAL, CKMB, CKMBINDEX, TROPONINI in the last 168 hours. BNP (last 3 results) No results for input(s): PROBNP in the last 8760 hours. HbA1C: No results for input(s): HGBA1C in the last 72 hours. CBG:  Recent Labs  Lab 09/20/17 1744 09/20/17 1827 09/20/17 2307 09/21/17 1056  GLUCAP 59* 135* 132* 120*   Lipid Profile: No results for input(s): CHOL, HDL, LDLCALC, TRIG, CHOLHDL, LDLDIRECT in the last 72 hours. Thyroid Function Tests: No results for input(s): TSH, T4TOTAL, FREET4, T3FREE, THYROIDAB in the last 72 hours. Anemia Panel: No results for input(s): VITAMINB12, FOLATE, FERRITIN, TIBC, IRON, RETICCTPCT in the last 72 hours. Urine analysis:    Component Value Date/Time   COLORURINE YELLOW (A) 11/13/2015 0350   APPEARANCEUR CLEAR (A) 11/13/2015 0350   LABSPEC 1.020 11/13/2015 0350   PHURINE 5.0 11/13/2015 0350   GLUCOSEU NEGATIVE 11/13/2015 0350   HGBUR 1+ (A) 11/13/2015 0350   BILIRUBINUR NEGATIVE 11/13/2015 0350   KETONESUR NEGATIVE 11/13/2015 0350   PROTEINUR 100 (A) 11/13/2015 0350   UROBILINOGEN 0.2 04/27/2013 2330   NITRITE NEGATIVE 11/13/2015 0350   LEUKOCYTESUR NEGATIVE 11/13/2015 0350   Sepsis  Labs: (procalcitonin:4,lacticidven:4)  ) Recent Results (from the past 240 hour(s))  Blood culture (routine x 2)     Status: None (Preliminary result)   Collection Time: 09/20/17  2:00 PM  Result Value Ref Range Status   Specimen Description BLOOD LEFT FOREARM  Final   Special Requests   Final    BOTTLES DRAWN AEROBIC ONLY Blood Culture results may not be optimal due to an inadequate volume of blood received in culture bottles   Culture   Final    NO GROWTH 2 DAYS Performed at [facility], [address] [place], [place] [address]    Report Status PENDING  Incomplete  Blood culture (routine x 2)     Status: None (Preliminary result)   Collection Time: 09/20/17  3:40 PM  Result Value Ref Range Status   Specimen Description BLOOD LEFT ARM  Final   Special Requests   Final    BOTTLES DRAWN AEROBIC AND ANAEROBIC Blood Culture results may not be optimal due to an inadequate volume of blood received in culture bottles   Culture   Final    NO GROWTH 2 DAYS Performed at [facility], [address], [place], [place] [address]    Report Status PENDING  Incomplete  MRSA PCR Screening     Status: Abnormal   Collection Time: 09/21/17 11:16 PM  Result Value Ref Range Status   MRSA by PCR POSITIVE (A) NEGATIVE Final    Comment:        The GeneXpert MRSA Assay (FDA approved for NASAL specimens only), is one component of a comprehensive MRSA colonization surveillance program. It is not intended to diagnose MRSA infection nor to guide or monitor treatment for MRSA infections. RESULT CALLED TO, READ BACK BY AND VERIFIED WITH: A LOWE RN 09/22/17 0256 JDW Performed at [facility], [address], [place], [place] [address]          Radiology Studies: No results found.      Scheduled Meds: . amitriptyline  25 mg Oral QHS  . Chlorhexidine Gluconate Cloth  6 each Topical Q0600  . dextrose  1 ampule Intravenous Once  . enoxaparin (LOVENOX) injection   40 mg Subcutaneous Q24H  . gabapentin  100 mg Oral BID  . lidocaine  1 patch Transdermal Q24H  . mupirocin ointment  1 application Nasal BID  . senna-docusate  2 tablet Oral BID   Continuous Infusions: . ceFEPime (MAXIPIME) IV Stopped (09/23/17 0610)  . vancomycin Stopped (09/23/17 0303)     LOS: 3 days  Time spent: 40 minutes    WOODS, Roselind Messier, MD [facility] Pager [phone]   If 7PM-7AM, please contact night-coverage [email or web address] Password [facility] 09/23/2017, 7:15 AM

## 2017-09-24 DIAGNOSIS — F1910 Other psychoactive substance abuse, uncomplicated: Secondary | ICD-10-CM

## 2017-09-24 DIAGNOSIS — F1994 Other psychoactive substance use, unspecified with psychoactive substance-induced mood disorder: Secondary | ICD-10-CM

## 2017-09-24 DIAGNOSIS — T50901A Poisoning by unspecified drugs, medicaments and biological substances, accidental (unintentional), initial encounter: Secondary | ICD-10-CM

## 2017-09-24 NOTE — Discharge Summary (Signed)
Physician Discharge Summary  Andrea King ZOX:[id number] DOB: 1975-05-19 DOA: 09/20/2017  PCP: Alberteen Spindle, CNM  Admit date: 09/20/2017 Discharge date: 09/24/2017  Time spent: 20 minutes  Recommendations for Outpatient Follow-up:  Metabolic encephalopathy/Heroin overdose -Multifactorial to include drug overdose, bacteremia?  Periorbital cellulitis.   -Resolved -Patient counseled extensively that her final blood bacterial cultures have not resulted, and given her history of IV drug use, endocarditis, and current periorbital cellulitis sequela of not continuing to stay and abide by recommended therapy could result in loss of eyesight, and DEATH.  Patient left AMA   RIGHT periorbital cellulitis/upper eyelid abscess -RIGHT upper eyelid cellulitis/abscess draining purulent fluid covered and clean. - Per epic note Case discussed with Dr. Genia Del ophthalmology recommended antibiotics, warm compresses and follow-up in his office 72 hours after discharge.   -Patient counseled extensively that her final blood bacterial cultures have not resulted, and given her history of IV drug use, endocarditis, and current periorbital cellulitis sequela of not continuing to stay and abide by recommended therapy could result in loss of eyesight, and DEATH.  Patient left AMA   Polysubstance abuse/IV drug abuse -5/20 UDS positive benzodiazepine, opiates, cocaine -Multiple track marks on arms, legs/neck -Discussed need to continue antibiotic treatment.  Awaiting blood cultures.\ -Patient counseled extensively that her final blood bacterial cultures have not resulted, and given her history of IV drug use, endocarditis, and current periorbital cellulitis sequela of not continuing to stay and abide by recommended therapy could result in loss of eyesight, and DEATH.  Patient left AMA   Hx tricuspid valve Repair/Endocarditis  -blood culture pending  -Patient counseled extensively that her final blood bacterial  cultures have not resulted, and given her history of IV drug use, endocarditis, and current periorbital cellulitis sequela of not continuing to stay and abide by recommended therapy could result in loss of eyesight, and DEATH.  Patient left AMA  Opioid Withdrawal  - Ensure patient on telemetry at all times - Ensure patient on SPO2 monitor all times - Monitor patient using COWS scale below q 4 hr . - Hydroxyzine 25 mg PRN q 4 hr  - Ativan 1 mg PRN q 4 hr -Loperamide 2 mg PRN  -Toradol 15 mg PRN q 6 hr  -On 5/24, depending upon patient's COWS score may start Suboxone -Patient counseled extensively that her final blood bacterial cultures have not resulted, and given her history of IV drug use, endocarditis, and current periorbital cellulitis sequela of not continuing to stay and abide by recommended therapy could result in loss of eyesight, and DEATH.  Patient left AMA    Discharge Diagnoses:  Principal Problem:   Periorbital cellulitis Active Problems:   Sepsis (HCC)   Substance induced mood disorder (HCC)   Opiate abuse, continuous (HCC)   Hypoglycemia   Palliative care by specialist   Goals of care, counseling/discussion   Discharge Condition: Guarded  Diet recommendation: Regular  Filed Weights   09/21/17 1726  Weight: 114 lb 3.2 oz (51.8 kg)    History of present illness:   42 y.o. WF PMHx Polysubstance Abuse with previous Endocarditis S/P Tricuspid valve replacement, staph bacteremia, Hepatitis C   Brought into the ER with altered mental status and suspected drug overdose. Periorbital cellulitis and a small 10 x 11 mm superficial right upper eyelid abscess which is draining  She was with her partner who is also being hospitalized. Patient was seen and evaluated.  Her friends found her with altered mental status and brought her to  the year.  She is still drowsy and not able to give coherent history.  As part of her evaluation she was found to have periorbital cellulitis that  is worrisome.  Patient is being admitted therefore for treatment.  It is not clear what patient took.  Patient treated for periorbital cellulitis, drug overdose.  -Patient counseled extensively that her final blood bacterial cultures have not resulted, and given her history of IV drug use, endocarditis, and current periorbital cellulitis sequela of not continuing to stay and abide by recommended therapy could result in loss of eyesight, and DEATH.  Patient left AMA     Consultations: Ophthalmologist    Cultures  5/20 blood NGTD 5/21 MRSA by PCR positive   Antibiotics . Anti-infectives (From admission, onward)   Start     Stop   09/23/17 1600  vancomycin (VANCOCIN) IVPB 1000 mg/200 mL premix         09/21/17 0400  ceFEPIme (MAXIPIME) 1 g in sodium chloride 0.9 % 100 mL IVPB         09/21/17 0200  vancomycin (VANCOCIN) IVPB 750 mg/150 ml premix  Status:  Discontinued     09/23/17 1524   09/20/17 2000  ceFEPIme (MAXIPIME) 2 g in sodium chloride 0.9 % 100 mL IVPB     09/20/17 2027   09/20/17 1415  vancomycin (VANCOCIN) IVPB 1000 mg/200 mL premix     09/20/17 1643       Discharge Exam: Vitals:   09/24/17 0029 09/24/17 0343 09/24/17 0757 09/24/17 0800  BP: 101/71 105/69  109/68  Pulse: 68 66 82 78  Resp: Temp: 98.1 F (36.7 C) 98.4 F (36.9 C) 98.1 F (36.7 C)   TempSrc: Oral Oral Oral   SpO2: 98% 98% 98% 98%  Weight:      Height:        General: A/O x4, No acute respiratory distress, cachectic, positive rhinorrhea Eyes: negative scleral hemorrhage, negative anisocoria, negative icterus, RIGHT eyelid large abscess negative sign of drainage today slightly improved. Neck:  Negative scars, masses, torticollis, lymphadenopathy, JVD Lungs: Clear to auscultation bilaterally without wheezes or crackles  Discharge Instructions    No Known Allergies Follow-up Information    [facility]. Call.   Why:  For primary care doctor  appointment Contact information: [address] [place], [place] [address]  [phone]             The results of significant diagnostics from this hospitalization (including imaging, microbiology, ancillary and laboratory) are listed below for reference.    Significant Diagnostic Studies: Dg Chest Port 1 View  Result Date: 09/20/2017 CLINICAL DATA:  Weakness and overdose. History of hepatitis-C and asthma. Current smoker. EXAM: PORTABLE CHEST 1 VIEW COMPARISON:  11/23/2015 FINDINGS: Median sternotomy sutures are noted. Heart size is within normal limits. No aortic aneurysm. No pulmonary consolidation. Surgical clips project over the left upper thorax and base of neck, similar in appearance. Resolution of presumed septic emboli since prior remote study. No new pulmonary consolidation or CHF. Bilateral nipple shadows project over the lung bases simulating pulmonary nodules. No aggressive osseous abnormality. IMPRESSION: No active disease. Electronically Signed   By: Tollie Eth M.D.   On: 09/20/2017 17:01   Ct Orbits W Contrast  Result Date: 09/20/2017 CLINICAL DATA:  RIGHT periorbital cellulitis and drainage for 3 days. Pain. EXAM: CT ORBITS WITH CONTRAST TECHNIQUE: Multidetector CT images was performed according to the standard protocol following intravenous contrast  administration. CONTRAST:  75mL OMNIPAQUE IOHEXOL 300 MG/ML  SOLN COMPARISON:  None. FINDINGS: ORBITS: Intact ocular globes. Lenses are located. Normal appearance of the optic nerve sheath complexes. Preservation of the orbital fat. Normal appearance of the extraocular muscles which are well located. Superior ophthalmic veins are not enlarged. SINUSES: Included paranasal sinuses and mastoid air cells are well aerated. INTRACRANIAL CONTENTS: Normal. OSSEOUS STRUCTURES/ SOFT TISSUES: RIGHT periorbital soft tissue swelling extending to premalar soft tissues. Superimposed 5 x 10 x 11 mm abscess within 1-2 mm of the skin  surface at the level of the superior orbital rim. No subcutaneous gas or radiopaque foreign bodies. Mild medial bowing of the LEFT greater than RIGHT medial orbital walls, possible old fractures. Small enhancing reactive RIGHT parotid lymph nodes. IMPRESSION: 1. RIGHT periorbital cellulitis and 5 x 10 x 11 mm superficial periorbital abscess. No orbital cellulitis. Electronically Signed   By: Awilda Metro M.D.   On: 09/20/2017 17:50    Microbiology: Recent Results (from the past 240 hour(s))  Blood culture (routine x 2)     Status: None (Preliminary result)   Collection Time: 09/20/17  2:00 PM  Result Value Ref Range Status   Specimen Description BLOOD LEFT FOREARM  Final   Special Requests   Final    BOTTLES DRAWN AEROBIC ONLY Blood Culture results may not be optimal due to an inadequate volume of blood received in culture bottles   Culture   Final    NO GROWTH 3 DAYS Performed at [facility], [address] [place], [place] [address]    Report Status PENDING  Incomplete  Blood culture (routine x 2)     Status: None (Preliminary result)   Collection Time: 09/20/17  3:40 PM  Result Value Ref Range Status   Specimen Description BLOOD LEFT ARM  Final   Special Requests   Final    BOTTLES DRAWN AEROBIC AND ANAEROBIC Blood Culture results may not be optimal due to an inadequate volume of blood received in culture bottles   Culture   Final    NO GROWTH 3 DAYS Performed at [facility], [address], [place], [place] [address]    Report Status PENDING  Incomplete  MRSA PCR Screening     Status: Abnormal   Collection Time: 09/21/17 11:16 PM  Result Value Ref Range Status   MRSA by PCR POSITIVE (A) NEGATIVE Final    Comment:        The GeneXpert MRSA Assay (FDA approved for NASAL specimens only), is one component of a comprehensive MRSA colonization surveillance program. It is not intended to diagnose MRSA infection nor to guide or monitor treatment for MRSA  infections. RESULT CALLED TO, READ BACK BY AND VERIFIED WITH: A LOWE RN 09/22/17 0256 JDW Performed at [facility], [address], [place], [place] [address]      Labs: Basic Metabolic Panel: Recent Labs  Lab 09/20/17 2240 09/21/17 0325 09/22/17 0458  NA 139 139 140  K 4.1 3.5 3.6  CL 100* 106 106  CO2 GLUCOSE 34* 70 100*  BUN 6 <5* 7  CREATININE 0.76 0.65 0.69  CALCIUM 9.2 7.8* 8.9   Liver Function Tests: Recent Labs  Lab 09/20/17 2240 09/21/17 0325  AST 35 15  ALT 19 10*  ALKPHOS 62 45  BILITOT 1.1 0.7  PROT 7.5 5.7*  ALBUMIN 4.1 2.8*   No results for input(s): LIPASE, AMYLASE in the last 168 hours. No  results for input(s): AMMONIA in the last 168 hours. CBC: Recent Labs  Lab 09/20/17 2240 09/21/17 0325 09/22/17 0458  WBC 10.6* 8.2 8.2  NEUTROABS 7.2  --   --   HGB 15.1* 13.1 13.1  HCT 48.2* 41.4 41.0  MCV 88.9 87.0 86.5  PLT 316 304 280   Cardiac Enzymes: No results for input(s): CKTOTAL, CKMB, CKMBINDEX, TROPONINI in the last 168 hours. BNP: BNP (last 3 results) No results for input(s): BNP in the last 8760 hours.  ProBNP (last 3 results) No results for input(s): PROBNP in the last 8760 hours.  CBG: Recent Labs  Lab 09/20/17 1744 09/20/17 1827 09/20/17 2307 09/21/17 1056  GLUCAP 59* 135* 132* 120*       Signed:  Carolyne Littles, MD [facility] [phone] pager

## 2017-09-24 NOTE — Progress Notes (Signed)
Pt stated if MD did not discharge her today she would go AMA. Pt asked if she could get dressed and go smoke and come back. This RN told her she could not leave unit until MD discharged her or she signed AMA papers. Dr Joseph Art on unit and notified. Dr Joseph Art told pt she was not ready for discharge today. Pt got dressed and removed tele leads and left unit. She would not let me remove her piv or midline. She stated she would be back to get her things. Meka El , Interior and spatial designer, and Dr Joseph Art notified. Pt refused to sign AMA papers

## 2017-09-25 LAB — CULTURE, BLOOD (ROUTINE X 2)
CULTURE: NO GROWTH
Culture: NO GROWTH

## 2017-12-28 ENCOUNTER — Other Ambulatory Visit: Payer: Self-pay

## 2017-12-28 ENCOUNTER — Encounter: Payer: Self-pay | Admitting: Emergency Medicine

## 2017-12-28 ENCOUNTER — Emergency Department: Payer: Self-pay

## 2017-12-28 ENCOUNTER — Inpatient Hospital Stay
Admission: EM | Admit: 2017-12-28 | Discharge: 2018-01-11 | DRG: 871 | Discharge status: Against Medical Advice | Payer: Self-pay | Attending: Internal Medicine | Admitting: Internal Medicine

## 2017-12-28 DIAGNOSIS — G062 Extradural and subdural abscess, unspecified: Secondary | ICD-10-CM | POA: Diagnosis present

## 2017-12-28 DIAGNOSIS — A4153 Sepsis due to Serratia: Principal | ICD-10-CM | POA: Diagnosis present

## 2017-12-28 DIAGNOSIS — G061 Intraspinal abscess and granuloma: Secondary | ICD-10-CM | POA: Diagnosis present

## 2017-12-28 DIAGNOSIS — B373 Candidiasis of vulva and vagina: Secondary | ICD-10-CM | POA: Diagnosis present

## 2017-12-28 DIAGNOSIS — M4802 Spinal stenosis, cervical region: Secondary | ICD-10-CM | POA: Diagnosis present

## 2017-12-28 DIAGNOSIS — Z8679 Personal history of other diseases of the circulatory system: Secondary | ICD-10-CM

## 2017-12-28 DIAGNOSIS — F17210 Nicotine dependence, cigarettes, uncomplicated: Secondary | ICD-10-CM | POA: Diagnosis present

## 2017-12-28 DIAGNOSIS — R52 Pain, unspecified: Secondary | ICD-10-CM

## 2017-12-28 DIAGNOSIS — M4642 Discitis, unspecified, cervical region: Secondary | ICD-10-CM | POA: Diagnosis present

## 2017-12-28 DIAGNOSIS — B1920 Unspecified viral hepatitis C without hepatic coma: Secondary | ICD-10-CM

## 2017-12-28 DIAGNOSIS — Z8719 Personal history of other diseases of the digestive system: Secondary | ICD-10-CM

## 2017-12-28 DIAGNOSIS — Z8774 Personal history of (corrected) congenital malformations of heart and circulatory system: Secondary | ICD-10-CM

## 2017-12-28 DIAGNOSIS — F1110 Opioid abuse, uncomplicated: Secondary | ICD-10-CM | POA: Diagnosis present

## 2017-12-28 DIAGNOSIS — R825 Elevated urine levels of drugs, medicaments and biological substances: Secondary | ICD-10-CM

## 2017-12-28 DIAGNOSIS — R451 Restlessness and agitation: Secondary | ICD-10-CM

## 2017-12-28 DIAGNOSIS — Z8709 Personal history of other diseases of the respiratory system: Secondary | ICD-10-CM

## 2017-12-28 DIAGNOSIS — Z716 Tobacco abuse counseling: Secondary | ICD-10-CM

## 2017-12-28 DIAGNOSIS — B9689 Other specified bacterial agents as the cause of diseases classified elsewhere: Secondary | ICD-10-CM

## 2017-12-28 DIAGNOSIS — F1910 Other psychoactive substance abuse, uncomplicated: Secondary | ICD-10-CM

## 2017-12-28 DIAGNOSIS — Z560 Unemployment, unspecified: Secondary | ICD-10-CM

## 2017-12-28 DIAGNOSIS — G894 Chronic pain syndrome: Secondary | ICD-10-CM | POA: Diagnosis present

## 2017-12-28 DIAGNOSIS — Z86018 Personal history of other benign neoplasm: Secondary | ICD-10-CM

## 2017-12-28 DIAGNOSIS — Z8614 Personal history of Methicillin resistant Staphylococcus aureus infection: Secondary | ICD-10-CM

## 2017-12-28 DIAGNOSIS — Z6820 Body mass index (BMI) 20.0-20.9, adult: Secondary | ICD-10-CM

## 2017-12-28 DIAGNOSIS — B370 Candidal stomatitis: Secondary | ICD-10-CM | POA: Diagnosis present

## 2017-12-28 DIAGNOSIS — R011 Cardiac murmur, unspecified: Secondary | ICD-10-CM

## 2017-12-28 DIAGNOSIS — R7881 Bacteremia: Secondary | ICD-10-CM

## 2017-12-28 DIAGNOSIS — M25511 Pain in right shoulder: Secondary | ICD-10-CM | POA: Diagnosis present

## 2017-12-28 DIAGNOSIS — E440 Moderate protein-calorie malnutrition: Secondary | ICD-10-CM

## 2017-12-28 DIAGNOSIS — F419 Anxiety disorder, unspecified: Secondary | ICD-10-CM | POA: Diagnosis present

## 2017-12-28 DIAGNOSIS — Z952 Presence of prosthetic heart valve: Secondary | ICD-10-CM

## 2017-12-28 LAB — COMPREHENSIVE METABOLIC PANEL
ALBUMIN: 3.5 g/dL (ref 3.5–5.0)
ALT: 8 U/L (ref 0–44)
ANION GAP: 11 (ref 5–15)
AST: 15 U/L (ref 15–41)
Alkaline Phosphatase: 74 U/L (ref 38–126)
BUN: 13 mg/dL (ref 6–20)
CHLORIDE: 101 mmol/L (ref 98–111)
CO2: 25 mmol/L (ref 22–32)
Calcium: 8.7 mg/dL — ABNORMAL LOW (ref 8.9–10.3)
Creatinine, Ser: 0.7 mg/dL (ref 0.44–1.00)
GFR calc Af Amer: >60 mL/min (ref >60)
GFR calc non Af Amer: >60 mL/min (ref >60)
GLUCOSE: 114 mg/dL — AB (ref 70–99)
POTASSIUM: 3.4 mmol/L — AB (ref 3.5–5.1)
Sodium: 137 mmol/L (ref 135–145)
Total Bilirubin: 0.7 mg/dL (ref 0.3–1.2)
Total Protein: 7.9 g/dL (ref 6.5–8.1)

## 2017-12-28 LAB — SEDIMENTATION RATE: SED RATE: 71 mm/h — AB (ref 0–20)

## 2017-12-28 LAB — CBC WITH DIFFERENTIAL/PLATELET
BASOS ABS: 0.1 10*3/uL (ref 0–0.1)
BASOS PCT: 1 %
EOS PCT: 1 %
Eosinophils Absolute: 0.1 10*3/uL (ref 0–0.7)
HCT: 34 % — ABNORMAL LOW (ref 35.0–47.0)
Hemoglobin: 11.3 g/dL — ABNORMAL LOW (ref 12.0–16.0)
Lymphocytes Relative: 8 %
Lymphs Abs: 1.5 10*3/uL (ref 1.0–3.6)
MCH: 27.2 pg (ref 26.0–34.0)
MCHC: 33.3 g/dL (ref 32.0–36.0)
MCV: 81.9 fL (ref 80.0–100.0)
MONO ABS: 1.4 10*3/uL — AB (ref 0.2–0.9)
Monocytes Relative: 7 %
Neutro Abs: 15.9 10*3/uL — ABNORMAL HIGH (ref 1.4–6.5)
Neutrophils Relative %: 83 %
PLATELETS: 309 10*3/uL (ref 150–440)
RBC: 4.16 MIL/uL (ref 3.80–5.20)
RDW: 15.2 % — AB (ref 11.5–14.5)
WBC: 19 10*3/uL — ABNORMAL HIGH (ref 3.6–11.0)

## 2017-12-28 LAB — MRSA PCR SCREENING: MRSA by PCR: POSITIVE — AB

## 2017-12-28 LAB — LACTIC ACID, PLASMA: Lactic Acid, Venous: 1.3 mmol/L (ref 0.5–1.9)

## 2017-12-28 LAB — TROPONIN I

## 2017-12-28 MED ORDER — GADOBENATE DIMEGLUMINE 529 MG/ML IV SOLN
10.0000 mL | Freq: Once | INTRAVENOUS | Status: AC | PRN
  Administered 2017-12-28: 10 mL via INTRAVENOUS

## 2017-12-28 MED ORDER — ACETAMINOPHEN 650 MG RE SUPP: 650.0000 mg | Freq: Four times a day (QID) | RECTAL | Status: DC | PRN

## 2017-12-28 MED ORDER — GADOXETATE DISODIUM 0.25 MMOL/ML IV SOLN: 10.0000 mL | Freq: Once | INTRAVENOUS | Status: DC | PRN

## 2017-12-28 MED ORDER — HYDROMORPHONE HCL 1 MG/ML IJ SOLN
INTRAMUSCULAR | Status: AC
  Filled 2017-12-28: qty 1

## 2017-12-28 MED ORDER — SODIUM CHLORIDE 0.9 % IV SOLN
INTRAVENOUS | Status: AC
  Administered 2017-12-28 – 2017-12-29 (×3): via INTRAVENOUS

## 2017-12-28 MED ORDER — POLYETHYLENE GLYCOL 3350 17 G PO PACK: 17.0000 g | PACK | Freq: Every day | ORAL | Status: DC | PRN

## 2017-12-28 MED ORDER — ACETAMINOPHEN 325 MG PO TABS
650.0000 mg | ORAL_TABLET | Freq: Four times a day (QID) | ORAL | Status: DC | PRN
  Administered 2018-01-10: 650 mg via ORAL
  Filled 2017-12-28: qty 2

## 2017-12-28 MED ORDER — IBUPROFEN 400 MG PO TABS
400.0000 mg | ORAL_TABLET | Freq: Four times a day (QID) | ORAL | Status: DC | PRN
  Filled 2017-12-28: qty 1

## 2017-12-28 MED ORDER — VANCOMYCIN HCL IN DEXTROSE 750-5 MG/150ML-% IV SOLN
750.0000 mg | Freq: Two times a day (BID) | INTRAVENOUS | Status: DC
  Administered 2017-12-28 – 2017-12-29 (×3): 750 mg via INTRAVENOUS
  Filled 2017-12-28 (×5): qty 150

## 2017-12-28 MED ORDER — HYDROMORPHONE HCL 1 MG/ML IJ SOLN
1.0000 mg | INTRAMUSCULAR | Status: DC | PRN
  Administered 2017-12-28 – 2017-12-29 (×5): 1 mg via INTRAVENOUS
  Filled 2017-12-28 (×4): qty 1

## 2017-12-28 MED ORDER — VANCOMYCIN HCL IN DEXTROSE 1-5 GM/200ML-% IV SOLN
1000.0000 mg | Freq: Once | INTRAVENOUS | Status: AC
  Administered 2017-12-28: 1000 mg via INTRAVENOUS
  Filled 2017-12-28: qty 200

## 2017-12-28 MED ORDER — CHLORHEXIDINE GLUCONATE CLOTH 2 % EX PADS: 6.0000 | MEDICATED_PAD | Freq: Every day | CUTANEOUS | Status: AC

## 2017-12-28 MED ORDER — KETOROLAC TROMETHAMINE 30 MG/ML IJ SOLN
30.0000 mg | Freq: Four times a day (QID) | INTRAMUSCULAR | Status: AC | PRN
  Administered 2017-12-28 – 2018-01-02 (×16): 30 mg via INTRAVENOUS
  Filled 2017-12-28 (×16): qty 1

## 2017-12-28 MED ORDER — LORAZEPAM 2 MG/ML IJ SOLN
1.0000 mg | Freq: Once | INTRAMUSCULAR | Status: AC
  Administered 2017-12-28: 1 mg via INTRAVENOUS
  Filled 2017-12-28: qty 1

## 2017-12-28 MED ORDER — PIPERACILLIN-TAZOBACTAM 3.375 G IVPB
3.3750 g | Freq: Three times a day (TID) | INTRAVENOUS | Status: DC
  Administered 2017-12-28 – 2017-12-29 (×3): 3.375 g via INTRAVENOUS
  Filled 2017-12-28 (×3): qty 50

## 2017-12-28 MED ORDER — PIPERACILLIN-TAZOBACTAM 3.375 G IVPB 30 MIN
3.3750 g | Freq: Once | INTRAVENOUS | Status: AC
  Administered 2017-12-28: 3.375 g via INTRAVENOUS
  Filled 2017-12-28: qty 50

## 2017-12-28 MED ORDER — ONDANSETRON HCL 4 MG PO TABS: 4.0000 mg | ORAL_TABLET | Freq: Four times a day (QID) | ORAL | Status: DC | PRN

## 2017-12-28 MED ORDER — ENOXAPARIN SODIUM 40 MG/0.4ML ~~LOC~~ SOLN
40.0000 mg | SUBCUTANEOUS | Status: DC
  Filled 2017-12-28 (×7): qty 0.4

## 2017-12-28 MED ORDER — SODIUM CHLORIDE 0.9 % IV SOLN
INTRAVENOUS | Status: DC | PRN
  Administered 2017-12-28: 250 mL via INTRAVENOUS
  Administered 2017-12-31 – 2018-01-01 (×2): 1000 mL via INTRAVENOUS
  Administered 2018-01-03 – 2018-01-05 (×3): 500 mL via INTRAVENOUS

## 2017-12-28 MED ORDER — HYDROMORPHONE HCL 1 MG/ML IJ SOLN
1.0000 mg | Freq: Once | INTRAMUSCULAR | Status: AC
  Administered 2017-12-28: 1 mg via INTRAVENOUS
  Filled 2017-12-28: qty 1

## 2017-12-28 MED ORDER — BISACODYL 5 MG PO TBEC: 5.0000 mg | DELAYED_RELEASE_TABLET | Freq: Every day | ORAL | Status: DC | PRN

## 2017-12-28 MED ORDER — ONDANSETRON HCL 4 MG/2ML IJ SOLN: 4.0000 mg | Freq: Four times a day (QID) | INTRAMUSCULAR | Status: DC | PRN

## 2017-12-28 MED ORDER — HYDROCODONE-ACETAMINOPHEN 5-325 MG PO TABS
1.0000 | ORAL_TABLET | ORAL | Status: DC | PRN
  Administered 2017-12-28: 1 via ORAL
  Administered 2017-12-28: 2 via ORAL
  Administered 2017-12-28: 1 via ORAL
  Administered 2017-12-29 – 2018-01-05 (×21): 2 via ORAL
  Filled 2017-12-28 (×3): qty 2
  Filled 2017-12-28: qty 1
  Filled 2017-12-28 (×2): qty 2
  Filled 2017-12-28: qty 1
  Filled 2017-12-28 (×7): qty 2
  Filled 2017-12-28: qty 1
  Filled 2017-12-28 (×11): qty 2

## 2017-12-28 MED ORDER — MUPIROCIN 2 % EX OINT
1.0000 "application " | TOPICAL_OINTMENT | Freq: Two times a day (BID) | CUTANEOUS | Status: AC
  Administered 2017-12-28 – 2018-01-02 (×9): 1 via NASAL
  Filled 2017-12-28: qty 22

## 2017-12-28 NOTE — ED Provider Notes (Signed)
CRITICAL CARE Performed by: Ulice DashJohnathan E Williams   Total critical care time: 30 minutes  Critical care time was exclusive of separately billable procedures and treating other patients.  Critical care was necessary to treat or prevent imminent or life-threatening deterioration.  Critical care was time spent personally by me on the following activities: development of treatment plan with patient and/or surrogate as well as nursing, discussions with consultants, evaluation of patient's response to treatment, examination of patient, obtaining history from patient or surrogate, ordering and performing treatments and interventions, ordering and review of laboratory studies, ordering and review of radiographic studies, pulse oximetry and re-evaluation of patient's condition  Labs Reviewed  CBC WITH DIFFERENTIAL/PLATELET - Abnormal; Notable for the following components:      Result Value   WBC 19.0 (*)    Hemoglobin 11.3 (*)    HCT 34.0 (*)    RDW 15.2 (*)    Neutro Abs 15.9 (*)    Monocytes Absolute 1.4 (*)    All other components within normal limits  COMPREHENSIVE METABOLIC PANEL - Abnormal; Notable for the following components:   Potassium 3.4 (*)    Glucose, Bld 114 (*)    Calcium 8.7 (*)    All other components within normal limits  SEDIMENTATION RATE - Abnormal; Notable for the following components:   Sed Rate 71 (*)    All other components within normal limits  CULTURE, BLOOD (ROUTINE X 2)  CULTURE, BLOOD (ROUTINE X 2)  TROPONIN I  LACTIC ACID, PLASMA  URINALYSIS, COMPLETE (UACMP) WITH MICROSCOPIC  URINE DRUG SCREEN, QUALITATIVE ([facility] ONLY)  POC URINE PREG, ED   MRI C-spine IMPRESSION: Disc degeneration and spurring at C5-6. Findings of diskitis at C5-6 with endplate irregularity and mild bone marrow edema enhancement. There is ventral epidural fluid collection with surrounding epidural enhancement surrounding the cord. Given the clinical information appearance this is  most consistent with discitis with epidural abscess. There is swelling of the cord with edema and mild spinal stenosis.  These results were called by telephone at the time of interpretation on 12/28/2017 at 11:16 am to Dr. Daryel NovemberJONATHAN WILLIAMS , who verbally acknowledged these results.  Given the above findings, I discussed with neurosurgery Dr. Adriana Simasook.  He will review the MRI and evaluate the patient in the ER.  She may need transfer as well as aspiration or drainage of the abscess.     Emily FilbertWilliams, Jonathan E, MD 12/28/17 1124

## 2017-12-28 NOTE — ED Notes (Addendum)
Spoke with MD Mayford KnifeWilliams, plan to start pt in flex after EKG and basic blood work, see orders

## 2017-12-28 NOTE — Progress Notes (Signed)
Patient ID: Andrea King, female   DOB: Nov 19, 1975, 42 y.o.   MRN: [id number] IR was consulted about a percutaneous drainage/aspiration of a cervical epidural abscess. I reviewed the MR cervical spine and no collection is amendable for percutaneous drainage. Discussed with Dr. Juliene PinaMody.

## 2017-12-28 NOTE — Progress Notes (Signed)
Pharmacy Antibiotic Note  Andrea King is a 42 y.o. female admitted on 12/28/2017 with epidural abscess/discitis.  Pharmacy has been consulted for Vancomycin and Zosyn dosing. ID consult is pending  Plan: Vancomycin 750mg  IV every 12 hours.  Goal trough 15-20 mcg/mL. Zosyn 3.375g IV q8h (4 hour infusion). Will check a Vancomycin trough prior to 5th dose.  Height: 5\' 2"  (157.5 cm) Weight: 120 lb (54.4 kg) IBW/kg (Calculated) : 50.1  Temp (24hrs), Avg:98.6 F (37 C), Min:98.6 F (37 C), Max:98.6 F (37 C)  Recent Labs  Lab 12/28/17 0736 12/28/17 0913  WBC 19.0*  --   CREATININE 0.70  --   LATICACIDVEN  --  1.3    Estimated Creatinine Clearance: 71.7 mL/min (by C-G formula based on SCr of 0.7 mg/dL).    No Known Allergies  Antimicrobials this admission: Vancomycin 8/27 >>  Zosyn 8/27 >>   Thank you for allowing pharmacy to be a part of this patient's care.  Clovia CuffLisa Clive Parcel, PharmD, BCPS 12/28/2017 1:39 PM

## 2017-12-28 NOTE — ED Provider Notes (Signed)
I have assumed care of the patient, she presents with a complicated medical history and concerning symptomatology.  She has radicular neck pain and subjective fever.  She does have diaphoresis.  She reports history of IV drug abuse but none in the last year.  She is pending an MRI of her cervical spine.   Emily FilbertWilliams, Jonathan E, MD 12/28/17 [phone]

## 2017-12-28 NOTE — Consult Note (Signed)
Neurosurgery-New Consultation Evaluation 12/28/2017 Andrea King [id number]  Identifying Statement: Andrea King is a 42 y.o. female from [place] [address] with cervical epidural abscess  Physician Requesting Consultation: [facility] ED  History of Present Illness: Andrea King is here with neck pain that has worsened over the past few days and she could not control at home. She states it is mostly posterior and in center of cervical spine. She denies any recent trauma. She denies any pain radiating down her arms. She also denies any numbness or weakness. She does not recall pain like this in the past. She has had no procedures performed on her neck. She has also had fevers and sweating at home and an infectious workup revealed elevated WBC and MRI was obtained showing an abscess.   Past Medical History:  Past Medical History:  Diagnosis Date  . Anemia   . Asthma   . DJD (degenerative joint disease)   . Hepatitis C   . Substance abuse (HCC)     Social History: Social History   Socioeconomic History  . Marital status: Single    Spouse name: Not on file  . Number of children: Not on file  . Years of education: Not on file  . Highest education level: Not on file  Occupational History  . Occupation: unemployed  Social Needs  . Financial resource strain: Not on file  . Food insecurity:    Worry: Not on file    Inability: Not on file  . Transportation needs:    Medical: Not on file    Non-medical: Not on file  Tobacco Use  . Smoking status: Current Every Day Smoker    Types: Cigarettes  . Smokeless tobacco: Never Used  Substance and Sexual Activity  . Alcohol use: No    Alcohol/week: 0.0 standard drinks  . Drug use: Yes    Comment: cocaine,Heroine  . Sexual activity: Yes    Birth control/protection: None  Lifestyle  . Physical activity:    Days per week: Not on file    Minutes per session: Not on file  . Stress: Not on file  Relationships  . Social  connections:    Talks on phone: Not on file    Gets together: Not on file    Attends religious service: Not on file    Active member of club or organization: Not on file    Attends meetings of clubs or organizations: Not on file    Relationship status: Not on file  . Intimate partner violence:    Fear of current or ex partner: Not on file    Emotionally abused: Not on file    Physically abused: Not on file    Forced sexual activity: Not on file  Other Topics Concern  . Not on file  Social History Narrative  . Not on file    Family History: Family History  Problem Relation Age of Onset  . Cancer Mother   . Cancer - Colon Maternal Grandfather   . Diabetes Maternal Grandfather     Review of Systems:  Review of Systems - General ROS: Negative Psychological ROS: Negative Ophthalmic ROS: Negative ENT ROS: Negative Hematological and Lymphatic ROS: Negative  Endocrine ROS: Negative Respiratory ROS: Negative Cardiovascular ROS: Negative Gastrointestinal ROS: Negative Genito-Urinary ROS: Negative Musculoskeletal ROS: Positive for neck pain Neurological ROS: Negative for weakness or numbness Dermatological ROS: Negative  Physical Exam: BP 113/69 (BP Location: Right Arm)   Pulse 78   Temp (!) 97.4  F (36.3 C) (Oral)   Resp 18   Ht 5\' 2"  (1.575 m)   Wt 49.7 kg   LMP 12/20/2017 (Approximate)   SpO2 100%   BMI 20.04 kg/m  Body mass index is 20.04 kg/m. Body surface area is 1.47 meters squared. General appearance: Alert, cooperative, appears umcomfortable Head: Normocephalic, atraumatic Eyes: Normal, EOM intact Oropharynx: Moist without lesions Neck: Supple, ROM appears full, some tenderness to palpation Ext: No edema in LE bilaterally, warm extremities  Neurologic exam:  Mental status: alertness: alert, affect: normal Speech: fluent and clear Motor:strength symmetric 5/5 in bilateral upper and lower extremities in all motor groups Sensory: intact to light touch in  upper and lower extremities Reflexes: 2+ and symmetric bilaterally for patella and bicep Gait: not tested in bed  Laboratory: Results for orders placed or performed during the hospital encounter of 12/28/17  MRSA PCR Screening  Result Value Ref Range   MRSA by PCR POSITIVE (A) NEGATIVE  CBC with Differential  Result Value Ref Range   WBC 19.0 (H) 3.6 - 11.0 K/uL   RBC 4.16 3.80 - 5.20 MIL/uL   Hemoglobin 11.3 (L) 12.0 - 16.0 g/dL   HCT 04.534.0 (L) 40.935.0 - 81.147.0 %   MCV 81.9 80.0 - 100.0 fL   MCH 27.2 26.0 - 34.0 pg   MCHC 33.3 32.0 - 36.0 g/dL   RDW 91.415.2 (H) 78.211.5 - 95.614.5 %   Platelets 309 150 - 440 K/uL   Neutrophils Relative % 83 %   Neutro Abs 15.9 (H) 1.4 - 6.5 K/uL   Lymphocytes Relative 8 %   Lymphs Abs 1.5 1.0 - 3.6 K/uL   Monocytes Relative 7 %   Monocytes Absolute 1.4 (H) 0.2 - 0.9 K/uL   Eosinophils Relative 1 %   Eosinophils Absolute 0.1 0 - 0.7 K/uL   Basophils Relative 1 %   Basophils Absolute 0.1 0 - 0.1 K/uL  Comprehensive metabolic panel  Result Value Ref Range   Sodium 137 135 - 145 mmol/L   Potassium 3.4 (L) 3.5 - 5.1 mmol/L   Chloride 101 98 - 111 mmol/L   CO2 25 22 - 32 mmol/L   Glucose, Bld 114 (H) 70 - 99 mg/dL   BUN 13 6 - 20 mg/dL   Creatinine, Ser 2.130.70 0.44 - 1.00 mg/dL   Calcium 8.7 (L) 8.9 - 10.3 mg/dL   Total Protein 7.9 6.5 - 8.1 g/dL   Albumin 3.5 3.5 - 5.0 g/dL   AST 15 15 - 41 U/L   ALT 8 0 - 44 U/L   Alkaline Phosphatase 74 38 - 126 U/L   Total Bilirubin 0.7 0.3 - 1.2 mg/dL   GFR calc non Af Amer >60 >60 mL/min   GFR calc Af Amer >60 >60 mL/min   Anion gap 11 5 - 15  Troponin I  Result Value Ref Range   Troponin I <0.03 <0.03 ng/mL  Lactic acid, plasma  Result Value Ref Range   Lactic Acid, Venous 1.3 0.5 - 1.9 mmol/L  Sedimentation rate  Result Value Ref Range   Sed Rate 71 (H) 0 - 20 mm/hr   I personally reviewed labs  Imaging: MRI Cervical Spine: Disc degeneration and spurring at C5-6. Findings of diskitis at C5-6 with  endplate irregularity and mild bone marrow edema enhancement. There is ventral epidural fluid collection with surrounding epidural enhancement surrounding the cord. Given the clinical information appearance this is most consistent with discitis with epidural abscess. There is swelling of the  cord with edema and mild spinal stenosis.   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 The ED has already administered antibiotics after sending cultures. Her exam is intact neurologically and no signs of spinal cord injury or radiculopathy. Given this, no urgent surgery is recommended as this would likely require hardware in setting of infection. We would recommend IR guided biopsy and antibiotics base don this. We can follow along to ensure no neurological changes.    1.  Diagnosis: Cervical Epidural Abscess   2.  Plan - Recommend IR guided biopsy - No urgent surgery recommended

## 2017-12-28 NOTE — ED Notes (Signed)
Patient returned from MRI.

## 2017-12-28 NOTE — ED Notes (Signed)
See triage note  Presents with neck pain which radiates into both shoulders  States pain started 1 week ago w/o trauma  States she also has had some subjective fever but afebrile on arrival

## 2017-12-28 NOTE — ED Notes (Signed)
Patient complaining of pain to IV site. IV flushed with 10 ml of saline. Small raised area noted at tip of catheter, however blood return still noted and swelling dose not increase with continued flushing. Will leave IV and look for alternate site before administering vancomycin.

## 2017-12-28 NOTE — ED Notes (Signed)
PT requesting meal tray. Admitting MD verbalized pt could eat. No meal trays at this time, pt given ice cream and a coke by request.

## 2017-12-28 NOTE — H&P (Signed)
[facility] [place] [facility]   PATIENT NAME: Andrea King    MR#:  [id number]  DATE OF BIRTH:  [birth year]  DATE OF ADMISSION:  12/28/2017  PRIMARY CARE PHYSICIAN: Alberteen Spindle, CNM   REQUESTING/REFERRING PHYSICIAN: dr Mayford Knife  CHIEF COMPLAINT:   Severe neck pain HISTORY OF PRESENT ILLNESS:  Andrea King  is a 42 y.o. female with a known history of  IVDA and tobacco abuse who presents to the ED complaining of neck pain x1 week.  Patient denies fever chills.  Patient rates pain 10 out of 10.  The pain radiates to her shoulders.  She reports she was a former IV drug user but no IV drug use in the last 2 years.  MRI was performed to the ER which shows discitis and epidural abscess.  Dr. Adriana Simas from her is her surgery saw the patient in consultation and at this point did not recommend transfer to another facility.  He recommended IV antibiotics and to discuss with IR if the epidural at this may be amenable to drainage.  PAST MEDICAL HISTORY:   Past Medical History:  Diagnosis Date  . Anemia   . Asthma   . DJD (degenerative joint disease)   . Hepatitis C   . Substance abuse (HCC)     PAST SURGICAL HISTORY:   Past Surgical History:  Procedure Laterality Date  . INGUINAL HERNIA REPAIR Left 04/28/2013   Procedure: HERNIA REPAIR INGUINAL ADULT with mesh;  Surgeon: Cherylynn Ridges, MD;  Location: [facility] OR;  Service: General;  Laterality: Left;  . TEE WITHOUT CARDIOVERSION N/A 11/18/2015   Procedure: TRANSESOPHAGEAL ECHOCARDIOGRAM (TEE);  Surgeon: Iran Ouch, MD;  Location: [facility] ORS;  Service: Cardiovascular;  Laterality: N/A;  . TUMOR EXCISION     left scalpula    SOCIAL HISTORY:   Social History   Tobacco Use  . Smoking status: Current Every Day Smoker    Types: Cigarettes  . Smokeless tobacco: Never Used  Substance Use Topics  . Alcohol use: No    Alcohol/week: 0.0 standard drinks    FAMILY HISTORY:   Family History  Problem Relation  Age of Onset  . Cancer Mother   . Cancer - Colon Maternal Grandfather   . Diabetes Maternal Grandfather     DRUG ALLERGIES:  No Known Allergies  REVIEW OF SYSTEMS:   Review of Systems  Constitutional: Negative.  Negative for chills, fever and malaise/fatigue.  HENT: Negative.  Negative for ear discharge, ear pain, hearing loss, nosebleeds and sore throat.   Eyes: Negative.  Negative for blurred vision and pain.  Respiratory: Negative.  Negative for cough, hemoptysis, shortness of breath and wheezing.   Cardiovascular: Negative.  Negative for chest pain, palpitations and leg swelling.  Gastrointestinal: Negative.  Negative for abdominal pain, blood in stool, diarrhea, nausea and vomiting.  Genitourinary: Negative.  Negative for dysuria.  Musculoskeletal: Positive for neck pain. Negative for back pain.  Skin: Negative.   Neurological: Negative for dizziness, tremors, speech change, focal weakness, seizures and headaches.  Endo/Heme/Allergies: Negative.  Does not bruise/bleed easily.  Psychiatric/Behavioral: Negative.  Negative for depression, hallucinations and suicidal ideas.    MEDICATIONS AT HOME:   Prior to Admission medications   Medication Sig Start Date End Date Taking? Authorizing Provider  acetaminophen (TYLENOL) 500 MG tablet Take 500-1,000 mg by mouth every 6 (six) hours as needed for mild pain or moderate pain.   Yes [provider]  ibuprofen (ADVIL,MOTRIN) 200 MG tablet Take  600 mg by mouth every 6 (six) hours as needed (for pain or headaches).    Yes [provider]      VITAL SIGNS:  Blood pressure 105/63, pulse 85, temperature 98.6 F (37 C), temperature source Oral, resp. rate 16, height 5\' 2"  (1.575 m), weight 54.4 kg, last menstrual period 12/20/2017, SpO2 100 %.  PHYSICAL EXAMINATION:   Physical Exam  Constitutional: She is oriented to person, place, and time. No distress.  HENT:  Head: Normocephalic.  Very poor dentition  Eyes: No  scleral icterus.  Neck: No JVD present. No tracheal deviation present.  Tenderness c5-c6  Cardiovascular: Normal rate, regular rhythm and normal heart sounds. Exam reveals no gallop and no friction rub.  No murmur heard. Pulmonary/Chest: Effort normal and breath sounds normal. No respiratory distress. She has no wheezes. She has no rales. She exhibits no tenderness.  Abdominal: Soft. Bowel sounds are normal. She exhibits no distension and no mass. There is no tenderness. There is no rebound and no guarding.  Musculoskeletal: Normal range of motion. She exhibits no edema or tenderness.  Neurological: She is alert and oriented to person, place, and time. She displays normal reflexes. No cranial nerve deficit or sensory deficit. She exhibits normal muscle tone. Coordination normal.  Skin: Skin is warm. No rash noted. No erythema.  Psychiatric: Judgment normal.      LABORATORY PANEL:   CBC Recent Labs  Lab 12/28/17 0736  WBC 19.0*  HGB 11.3*  HCT 34.0*  PLT 309   ------------------------------------------------------------------------------------------------------------------  Chemistries  Recent Labs  Lab 12/28/17 0736  NA 137  K 3.4*  CL 101  CO2 25  GLUCOSE 114*  BUN 13  CREATININE 0.70  CALCIUM 8.7*  AST 15  ALT 8  ALKPHOS 74  BILITOT 0.7   ------------------------------------------------------------------------------------------------------------------  Cardiac Enzymes Recent Labs  Lab 12/28/17 0736  TROPONINI <0.03   ------------------------------------------------------------------------------------------------------------------  RADIOLOGY:  Dg Cervical Spine Complete  Result Date: 12/28/2017 CLINICAL DATA:  Posterior neck pain extending into the shoulders. EXAM: CERVICAL SPINE - COMPLETE 4+ VIEW COMPARISON:  None. FINDINGS: The cervical spine is visualized the skull base through the cervicothoracic junction. Prevertebral soft tissues are within normal  limits. Slight degenerative retrolisthesis is present at C5-6. There is uncovertebral spurring at C5-6. This leads to some degree of foraminal narrowing bilaterally, potentially impacting the C6 nerve roots. The patient is status post median sternotomy. Surgical clips are also present over the left shoulder. IMPRESSION: 1. Focal degenerative disc disease evident at C5-6 with possible foraminal narrowing bilaterally due to uncovertebral spurring. This could impact the C6 nerve roots on either side. 2. No acute abnormality. Electronically Signed   By: Marin Robertshristopher  Mattern M.D.   On: 12/28/2017 08:45   Mr Cervical Spine W Or Wo Contrast  Result Date: 12/28/2017 CLINICAL DATA:  Neck pain fever leukocytosis. History of IV drug abuse EXAM: MRI CERVICAL SPINE WITHOUT AND WITH CONTRAST TECHNIQUE: Multiplanar and multiecho pulse sequences of the cervical spine, to include the craniocervical junction and cervicothoracic junction, were obtained without and with intravenous contrast. CONTRAST:  10mL MULTIHANCE GADOBENATE DIMEGLUMINE 529 MG/ML IV SOLN COMPARISON:  Cervical spine radiographs 12/28/2017 FINDINGS: Alignment: Mild retrolisthesis C5-6.  Remaining alignment normal Vertebrae: Disc space narrowing at C5-6 with mild bone marrow edema and mild bone marrow enhancement. Given the ventral epidural fluid collection and dural enhancement this is most likely discitis and abscess. No fracture Cord: Mild swelling of the cord of the C5 and C6 level with mild cord edema  due to infection and mild spinal stenosis Posterior Fossa, vertebral arteries, paraspinal tissues: Mild prevertebral soft tissue edema due to discitis. Disc levels: C2-3: Negative C3-4: Negative C4-5: Negative C5-6: Disc degeneration with disc space narrowing and spurring. Mild endplate irregularity and mild bone marrow edema. Ventral epidural nonenhancing process with surrounding dural enhancement most likely epidural abscess. Extruded disc fragment or hematoma  are in the differential. There is circumferential dural thickening and enhancement with cord flattening and cord edema. C6-7: Epidural thickening from C5-6 extends down to the C6-7 disc space. Normal disc space at C6-7. C7-T1: Negative IMPRESSION: Disc degeneration and spurring at C5-6. Findings of diskitis at C5-6 with endplate irregularity and mild bone marrow edema enhancement. There is ventral epidural fluid collection with surrounding epidural enhancement surrounding the cord. Given the clinical information appearance this is most consistent with discitis with epidural abscess. There is swelling of the cord with edema and mild spinal stenosis. These results were called by telephone at the time of interpretation on 12/28/2017 at 11:16 am to Dr. Daryel November , who verbally acknowledged these results. Electronically Signed   By: Marlan Palau M.D.   On: 12/28/2017 11:18    EKG:  No ST elevation  NSR   IMPRESSION AND PLAN:   42 year old female with history of tobacco dependence and drug use who presents with neck pain and found to have discitis and epidural abscess.  1.  Sepsis: Patient presents with tachycardia and leukocytosis.  Sepsis is from epidural abscess and discitis. NS started Follow CBC   2.  Epidural abscess/discitis: Start vancomycin and Zosyn ID consultation requested Patient evaluated by neurosurgery while in the ER and at this time recommendations are for interventional radiology to see if the abscess can be drained. ER MD said he would discuss with IR if this would be possible.  3.Tobacco dependence: Patient is encouraged to quit smoking. Counseling was provided for 4 minutes.  4.  History of IV drug use She reports no drug use for the past 18 to 24 months.   All the records are reviewed and case discussed with ED provider. Management plans discussed with the patient and she is in agreement  CODE STATUS: full  TOTAL TIME TAKING CARE OF THIS PATIENT: 42  minutes.    Gurvir Schrom M.D on 12/28/2017 at 12:22 PM  Between 7am to 6pm - Pager - [phone]  After 6pm go to [email or web address] - Social research officer, government  [facility]  Office  [phone]  CC: Primary care physician; Alberteen Spindle, CNM

## 2017-12-28 NOTE — ED Notes (Signed)
Patient transported to MRI 

## 2017-12-28 NOTE — Consult Note (Signed)
Date of Admission:  12/28/2017                 Reason for Consult: cervical discitis   Referring Provider: Mody    HPI: Andrea King is a 42 y.o. female with IV drug use, history of MRSA bacteremia  With septic emboli to lungs in 2017 for which she did not complete treatment ( left AMA) , hepatitis C presented to [facility] on 12/28/2017 with neck pain for the past week, pain radiating to her shoulders.  In the ED her temperature was 98.6, heart rate of 107, respiratory rate of 19, blood pressure 107/94.  WBC was 19,  She had an MRI of the cervical spine which showed discitis C5-C6 with some ventral fluid collection, started on Vanco and Zosyn .  This fluid collection is not amenable to drainage by interventional radiology. I am asked to see the patient for management of her infection. As per patient the current problem started a week ago. She thought it was neck crick and then it got worse and started  radiating to her left arm. She also felt sweaty and hot. She denies any cough, chest pain, sob or GI symptoms. In July  2017 patient had MRSA in the blood and was admitted to [facility] but left AMA and did not complete treatment. she then ended up in [facility] in October 2019 and had strep sanguis bacteremia with tricuspid endocarditis and  on 03/02/16  she went to the OR for tricuspid valve replacement but it was noted that the anterior leaflet was perforated and no vegetation present so the leaflet was repaired and a 30 mm ring annuloplasty performed as well as PFO closure. During that hospitalization she was on antibiotics and after surgery had daptomycin for 2 weeks.   Pt says she has not used  IVDA or any drugs  in the near past.  Past Medical History:  Diagnosis Date  . Anemia   . Asthma   . DJD (degenerative joint disease)   . Hepatitis C   . Substance abuse (HCC)    PSH Benign tumor removal left scapula Tricuspid valve repair Inguinal hernia repair   Social History    Tobacco Use  . Smoking status: Current Every Day Smoker    Types: Cigarettes  . Smokeless tobacco: Never Used  Substance Use Topics  . Alcohol use: No    Alcohol/week: 0.0 standard drinks  . Drug use: Yes    Comment: cocaine,Heroine    Family History  Problem Relation Age of Onset  . Cancer Mother   . Cancer - Colon Maternal Grandfather   . Diabetes Maternal Grandfather      . enoxaparin (LOVENOX) injection  40 mg Subcutaneous Q24H      Abtx:  Anti-infectives (From admission, onward)   Start     Dose/Rate Route Frequency Ordered Stop   12/28/17 2200  vancomycin (VANCOCIN) IVPB 750 mg/150 ml premix     750 mg 150 mL/hr over 60 Minutes Intravenous Every 12 hours 12/28/17 1336     12/28/17 1600  piperacillin-tazobactam (ZOSYN) IVPB 3.375 g     3.375 g 12.5 mL/hr over 240 Minutes Intravenous Every 8 hours 12/28/17 1336     12/28/17 0930  vancomycin (VANCOCIN) IVPB 1000 mg/200 mL premix     1,000 mg 200 mL/hr over 60 Minutes Intravenous  Once 12/28/17 0917 12/28/17 1238   12/28/17 0930  piperacillin-tazobactam (ZOSYN) IVPB 3.375 g     3.375 g 100  mL/hr over 30 Minutes Intravenous  Once 12/28/17 1610 12/28/17 0958       Review of Systems: Review of Systems  Constitutional: Positive for chills, fever and malaise/fatigue.  HENT: Negative for congestion and sore throat.   Eyes: Negative for blurred vision.  Respiratory: Positive for shortness of breath. Negative for cough and sputum production.   Cardiovascular: Negative for chest pain, palpitations and PND.  Gastrointestinal: Negative for abdominal pain, nausea and vomiting.  Genitourinary: Negative for dysuria, frequency and hematuria.  Musculoskeletal: Positive for neck pain.  Skin: Positive for rash.  Neurological: Positive for headaches.  Psychiatric/Behavioral: Positive for substance abuse.    No Known Allergies  OBJECTIVE: Blood pressure (!) 94/41, pulse 79, temperature 97.8 F (36.6 C), temperature  source Oral, resp. rate 18, height 5\' 2"  (1.575 m), weight 49.7 kg, last menstrual period 12/20/2017, SpO2 100 %.  Physical Exam  Constitutional: She is oriented to person, place, and time. She appears distressed.  Pale and thin  HENT:  Head: Normocephalic.  Eyes: Pupils are equal, round, and reactive to light.  Neck:  Sever epain neck- did not check for neck rigidity or movt  Cardiovascular:  s1s2 2/6 Systolic murmur  Sternal scar  Pulmonary/Chest:  B/l air entry Decreased bases   Abdominal: Soft. Bowel sounds are normal. She exhibits no distension.  Musculoskeletal: Normal range of motion.  Except neck   Neurological: She is alert and oriented to person, place, and time.  No focal weakness  Skin:  Multiple excoriations and scars legs  Psychiatric:  Slight agitation due to pain    Lab Results CBC    Component Value Date/Time   WBC 19.0 (H) 12/28/2017 0736   RBC 4.16 12/28/2017 0736   HGB 11.3 (L) 12/28/2017 0736   HCT 34.0 (L) 12/28/2017 0736   PLT 309 12/28/2017 0736   MCV 81.9 12/28/2017 0736   MCH 27.2 12/28/2017 0736   MCHC 33.3 12/28/2017 0736   RDW 15.2 (H) 12/28/2017 0736   LYMPHSABS 1.5 12/28/2017 0736   MONOABS 1.4 (H) 12/28/2017 0736   EOSABS 0.1 12/28/2017 0736   BASOSABS 0.1 12/28/2017 0736    CMP Latest Ref Rng & Units 12/28/2017 09/22/2017 09/21/2017  Glucose 70 - 99 mg/dL 960(A) 540(J) 70  BUN 6 - 20 mg/dL 13 7 <8(J)  Creatinine 0.44 - 1.00 mg/dL 1.91 4.78 2.95  Sodium 135 - 145 mmol/L 137 140 139  Potassium 3.5 - 5.1 mmol/L 3.4(L) 3.6 3.5  Chloride 98 - 111 mmol/L 101 106 106  CO2 22 - 32 mmol/L 25 25 24   Calcium 8.9 - 10.3 mg/dL 6.2(Z) 8.9 3.0(Q)  Total Protein 6.5 - 8.1 g/dL 7.9 - 5.7(L)  Total Bilirubin 0.3 - 1.2 mg/dL 0.7 - 0.7  Alkaline Phos 38 - 126 U/L 74 - 45  AST 15 - 41 U/L 15 - 15  ALT 0 - 44 U/L 8 - 10(L)      Microbiology: No results found for this or any previous visit (from the past 240 hour(s)).  Disc degeneration and  spurring at C5-6. Findings of diskitis at C5-6 with endplate irregularity and mild bone marrow edema enhancement. There is ventral epidural fluid collection with surrounding epidural enhancement surrounding the cord. Given the clinical information appearance this is most consistent with discitis with epidural abscess. There is swelling of the cord with edema and mild spinal stenosis. Radiographs and labs were personally reviewed by me.   Assessment and Plan 42 y.o. female with IV drug use, history of  MRSA bacteremia  With septic emboli to lungs in 2017 for which she did not complete treatment ( left AMA) , hepatitis C presented to [facility] on 12/28/2017 with neck pain for the past week, pain radiating to her shoulders.  In the ED her temperature was 98.6, heart rate of 107, respiratory rate of 19, blood pressure 107/94.  WBC was 19,  She had an MRI of the cervical spine which showed discitis C5-C6 with some ventral fluid collection, started on Vanco and Zosyn .  This fluid collection is not amenable to drainage by interventional radiology. I am asked to see the patient for management of her infection.   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 Has a history of IVDA even though she denies doing it in the last past 2 years.  Very likely the infection is related to the same practice.  Tox positive for cocaine. Not amenable for IR drainage.    Serratia bacteremia.  Serratia is organism found in water and very likely this is due to IV drug use.  She has not been in any hospital recently. Currently on vancomycin and Zosyn we will change the Zosyn to cefepime. Will need to rule out endocarditis.  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  MRSA bacteremia in 2017. Did Not complete treatment. Discussed the management with the patient and her nurse in  detail  Lynn ItoJAYASHREE Marcia Lepera, MD  12/28/2017, 3:46 PM  Note:  This document was prepared using Dragon voice recognition software and may include unintentional dictation errors.

## 2017-12-28 NOTE — ED Triage Notes (Signed)
Pt arrived with complaints of neck pain for the last week.pt states the pain radiates to her shoulders. Pt denies any injury. Pt in NAD in triage

## 2017-12-29 DIAGNOSIS — E440 Moderate protein-calorie malnutrition: Secondary | ICD-10-CM

## 2017-12-29 LAB — URINALYSIS, COMPLETE (UACMP) WITH MICROSCOPIC
Bilirubin Urine: NEGATIVE
GLUCOSE, UA: NEGATIVE mg/dL
Ketones, ur: NEGATIVE mg/dL
Leukocytes, UA: NEGATIVE
NITRITE: NEGATIVE
PROTEIN: NEGATIVE mg/dL
SPECIFIC GRAVITY, URINE: 1.011 (ref 1.005–1.030)
pH: 6 (ref 5.0–8.0)

## 2017-12-29 LAB — BASIC METABOLIC PANEL
ANION GAP: 8 (ref 5–15)
BUN: 10 mg/dL (ref 6–20)
CHLORIDE: 111 mmol/L (ref 98–111)
CO2: 19 mmol/L — AB (ref 22–32)
Calcium: 8.1 mg/dL — ABNORMAL LOW (ref 8.9–10.3)
Creatinine, Ser: 0.6 mg/dL (ref 0.44–1.00)
GFR calc non Af Amer: >60 mL/min (ref >60)
GLUCOSE: 114 mg/dL — AB (ref 70–99)
POTASSIUM: 3.8 mmol/L (ref 3.5–5.1)
Sodium: 138 mmol/L (ref 135–145)

## 2017-12-29 LAB — BLOOD CULTURE ID PANEL (REFLEXED)
Acinetobacter baumannii: NOT DETECTED
CANDIDA GLABRATA: NOT DETECTED
CANDIDA KRUSEI: NOT DETECTED
CARBAPENEM RESISTANCE: NOT DETECTED
Candida albicans: NOT DETECTED
Candida parapsilosis: NOT DETECTED
Candida tropicalis: NOT DETECTED
ENTEROCOCCUS SPECIES: NOT DETECTED
ESCHERICHIA COLI: NOT DETECTED
Enterobacter cloacae complex: NOT DETECTED
Enterobacteriaceae species: DETECTED — AB
Haemophilus influenzae: NOT DETECTED
KLEBSIELLA OXYTOCA: NOT DETECTED
KLEBSIELLA PNEUMONIAE: NOT DETECTED
LISTERIA MONOCYTOGENES: NOT DETECTED
Neisseria meningitidis: NOT DETECTED
PSEUDOMONAS AERUGINOSA: NOT DETECTED
Proteus species: NOT DETECTED
SERRATIA MARCESCENS: DETECTED — AB
STAPHYLOCOCCUS AUREUS BCID: NOT DETECTED
STAPHYLOCOCCUS SPECIES: NOT DETECTED
STREPTOCOCCUS PNEUMONIAE: NOT DETECTED
STREPTOCOCCUS PYOGENES: NOT DETECTED
Streptococcus agalactiae: NOT DETECTED
Streptococcus species: NOT DETECTED

## 2017-12-29 LAB — CBC
HEMATOCRIT: 28.6 % — AB (ref 35.0–47.0)
HEMOGLOBIN: 9.9 g/dL — AB (ref 12.0–16.0)
MCH: 27.7 pg (ref 26.0–34.0)
MCHC: 34.5 g/dL (ref 32.0–36.0)
MCV: 80.2 fL (ref 80.0–100.0)
Platelets: 289 10*3/uL (ref 150–440)
RBC: 3.56 MIL/uL — AB (ref 3.80–5.20)
RDW: 15.6 % — ABNORMAL HIGH (ref 11.5–14.5)
WBC: 14.7 10*3/uL — ABNORMAL HIGH (ref 3.6–11.0)

## 2017-12-29 LAB — URINE DRUG SCREEN, QUALITATIVE (ARMC ONLY)
AMPHETAMINES, UR SCREEN: NOT DETECTED
Barbiturates, Ur Screen: NOT DETECTED
CANNABINOID 50 NG, UR ~~LOC~~: NOT DETECTED
COCAINE METABOLITE, UR ~~LOC~~: POSITIVE — AB
MDMA (ECSTASY) UR SCREEN: NOT DETECTED
Methadone Scn, Ur: NOT DETECTED
Opiate, Ur Screen: POSITIVE — AB
Phencyclidine (PCP) Ur S: NOT DETECTED
Tricyclic, Ur Screen: NOT DETECTED

## 2017-12-29 MED ORDER — ENSURE ENLIVE PO LIQD
237.0000 mL | Freq: Three times a day (TID) | ORAL | Status: DC
  Administered 2017-12-29 – 2018-01-11 (×36): 237 mL via ORAL

## 2017-12-29 MED ORDER — HYDROMORPHONE HCL 1 MG/ML IJ SOLN
0.5000 mg | Freq: Once | INTRAMUSCULAR | Status: AC
  Administered 2017-12-29: 0.5 mg via INTRAVENOUS
  Filled 2017-12-29: qty 0.5

## 2017-12-29 MED ORDER — ADULT MULTIVITAMIN W/MINERALS CH
1.0000 | ORAL_TABLET | Freq: Every day | ORAL | Status: DC
  Administered 2017-12-29 – 2018-01-11 (×13): 1 via ORAL
  Filled 2017-12-29 (×14): qty 1

## 2017-12-29 MED ORDER — NICOTINE 21 MG/24HR TD PT24
21.0000 mg | MEDICATED_PATCH | Freq: Every day | TRANSDERMAL | Status: DC
  Administered 2017-12-29 – 2018-01-06 (×9): 21 mg via TRANSDERMAL
  Filled 2017-12-29 (×9): qty 1

## 2017-12-29 MED ORDER — HYDROMORPHONE HCL 1 MG/ML IJ SOLN
1.0000 mg | INTRAMUSCULAR | Status: DC | PRN
  Administered 2017-12-29 – 2017-12-30 (×7): 1 mg via INTRAVENOUS
  Filled 2017-12-29 (×7): qty 1

## 2017-12-29 MED ORDER — SODIUM CHLORIDE 0.9 % IV SOLN
2.0000 g | Freq: Three times a day (TID) | INTRAVENOUS | Status: DC
  Administered 2017-12-29 – 2018-01-11 (×40): 2 g via INTRAVENOUS
  Filled 2017-12-29 (×44): qty 2

## 2017-12-29 NOTE — Progress Notes (Addendum)
PHARMACY - PHYSICIAN COMMUNICATION CRITICAL VALUE ALERT - BLOOD CULTURE IDENTIFICATION (BCID)  Andrea King is an 42 y.o. female who [facility]2017 with a chief complaint of osteomyelitis and sepsis w/ epidural abscess  Assessment: Blood cx 1/2 sets Serratia marcescens w/ suspected epidural abscess  Name of physician (or Provider) Contacted: Dr. Amado CoeGouru  Current antibiotics: Vancomycin, Zosyn  Changes to prescribed antibiotics recommended: ID consult, change Zosyn to cefepime. Continue vancomycin  Results for orders placed or performed during the hospital encounter of 12/28/17  Blood Culture ID Panel (Reflexed) (Collected: 12/28/2017  9:13 AM)  Result Value Ref Range   Enterococcus species NOT DETECTED NOT DETECTED   Listeria monocytogenes NOT DETECTED NOT DETECTED   Staphylococcus species NOT DETECTED NOT DETECTED   Staphylococcus aureus NOT DETECTED NOT DETECTED   Streptococcus species NOT DETECTED NOT DETECTED   Streptococcus agalactiae NOT DETECTED NOT DETECTED   Streptococcus pneumoniae NOT DETECTED NOT DETECTED   Streptococcus pyogenes NOT DETECTED NOT DETECTED   Acinetobacter baumannii NOT DETECTED NOT DETECTED   Enterobacteriaceae species DETECTED (A) NOT DETECTED   Enterobacter cloacae complex NOT DETECTED NOT DETECTED   Escherichia coli NOT DETECTED NOT DETECTED   Klebsiella oxytoca NOT DETECTED NOT DETECTED   Klebsiella pneumoniae NOT DETECTED NOT DETECTED   Proteus species NOT DETECTED NOT DETECTED   Serratia marcescens DETECTED (A) NOT DETECTED   Carbapenem resistance NOT DETECTED NOT DETECTED   Haemophilus influenzae NOT DETECTED NOT DETECTED   Neisseria meningitidis NOT DETECTED NOT DETECTED   Pseudomonas aeruginosa NOT DETECTED NOT DETECTED   Candida albicans NOT DETECTED NOT DETECTED   Candida glabrata NOT DETECTED NOT DETECTED   Candida krusei NOT DETECTED NOT DETECTED   Candida parapsilosis NOT DETECTED NOT DETECTED   Candida  tropicalis NOT DETECTED NOT DETECTED   Pricilla RiffleAbby K Ellington, PharmD Pharmacy Resident  12/29/2017 2:03 PM

## 2017-12-29 NOTE — Care Management (Signed)
Admitted with cervical Epidural abscess.  IV antibiotics.  Positive for cocaine on admission.  Evaluated by neurosurgery and no urgent surgery recommended.  Was in too much pain to participate with neurology evaluation today when attempted

## 2017-12-29 NOTE — Consult Note (Addendum)
Neurology service attempted to see patient but she is refusing neurological exam at this time. She appears to be in significant pain and does not want to participate in this consult. Will re- evaluate at a later time and date.  Thana FarrLeslie Coulson Wehner, MD Neurology [phone]

## 2017-12-29 NOTE — Progress Notes (Signed)
[facility] Physicians - [place] at [facility]   PATIENT NAME: Andrea King    MR#:  [id number]  DATE OF BIRTH:  May 24, 1975  SUBJECTIVE:  CHIEF COMPLAINT: Patient is complaining of pain in the back of her neck Denies any weakness or numbness in her extremities  REVIEW OF SYSTEMS:  CONSTITUTIONAL: No fever, fatigue or weakness.  EYES: No blurred or double vision.  EARS, NOSE, AND THROAT: No tinnitus or ear pain.  RESPIRATORY: No cough, shortness of breath, wheezing or hemoptysis.  CARDIOVASCULAR: No chest pain, orthopnea, edema.  GASTROINTESTINAL: No nausea, vomiting, diarrhea or abdominal pain.  GENITOURINARY: No dysuria, hematuria.  ENDOCRINE: No polyuria, nocturia,  HEMATOLOGY: No anemia, easy bruising or bleeding SKIN: No rash or lesion. MUSCULOSKELETAL: No joint pain or arthritis.  Reporting pain in the back of her neck NEUROLOGIC: No tingling, numbness, weakness.  PSYCHIATRY: No anxiety or depression.   DRUG ALLERGIES:  No Known Allergies  VITALS:  Blood pressure 113/69, pulse 78, temperature (!) 97.4 F (36.3 C), temperature source Oral, resp. rate 18, height 5\' 2"  (1.575 m), weight 49.7 kg, last menstrual period 12/20/2017, SpO2 100 %.  PHYSICAL EXAMINATION:  GENERAL:  42 y.o.-year-old patient lying in the bed with no acute distress.  EYES: Pupils equal, round, reactive to light and accommodation. No scleral icterus. Extraocular muscles intact.  HEENT: Head atraumatic, normocephalic. Oropharynx and nasopharynx clear.  NECK:  Supple, no jugular venous distention. No thyroid enlargement, posterior cervical aspect of the neck is tender to touch with decreased range of motion of the neck LUNGS: Normal breath sounds bilaterally, no wheezing, rales,rhonchi or crepitation. No use of accessory muscles of respiration.  CARDIOVASCULAR: S1, S2 normal. No murmurs, rubs, or gallops.  ABDOMEN: Soft, nontender, nondistended. Bowel sounds present. No organomegaly or  mass.  EXTREMITIES: No pedal edema, cyanosis, or clubbing.  NEUROLOGIC: Cranial nerves II through XII are intact. Muscle strength 5/5 in all extremities. Sensation intact. Gait not checked.  PSYCHIATRIC: The patient is alert and oriented x 3.  SKIN: No obvious rash, lesion, or ulcer.    LABORATORY PANEL:   CBC Recent Labs  Lab 12/28/17 0736  WBC 19.0*  HGB 11.3*  HCT 34.0*  PLT 309   ------------------------------------------------------------------------------------------------------------------  Chemistries  Recent Labs  Lab 12/28/17 0736  NA 137  K 3.4*  CL 101  CO2 25  GLUCOSE 114*  BUN 13  CREATININE 0.70  CALCIUM 8.7*  AST 15  ALT 8  ALKPHOS 74  BILITOT 0.7   ------------------------------------------------------------------------------------------------------------------  Cardiac Enzymes Recent Labs  Lab 12/28/17 0736  TROPONINI <0.03   ------------------------------------------------------------------------------------------------------------------  RADIOLOGY:  Dg Cervical Spine Complete  Result Date: 12/28/2017 CLINICAL DATA:  Posterior neck pain extending into the shoulders. EXAM: CERVICAL SPINE - COMPLETE 4+ VIEW COMPARISON:  None. FINDINGS: The cervical spine is visualized the skull base through the cervicothoracic junction. Prevertebral soft tissues are within normal limits. Slight degenerative retrolisthesis is present at C5-6. There is uncovertebral spurring at C5-6. This leads to some degree of foraminal narrowing bilaterally, potentially impacting the C6 nerve roots. The patient is status post median sternotomy. Surgical clips are also present over the left shoulder. IMPRESSION: 1. Focal degenerative disc disease evident at C5-6 with possible foraminal narrowing bilaterally due to uncovertebral spurring. This could impact the C6 nerve roots on either side. 2. No acute abnormality. Electronically Signed   By: Marin Roberts M.D.   On: 12/28/2017  08:45   Mr Cervical Spine W Or Wo Contrast  Result Date:  12/28/2017 CLINICAL DATA:  Neck pain fever leukocytosis. History of IV drug abuse EXAM: MRI CERVICAL SPINE WITHOUT AND WITH CONTRAST TECHNIQUE: Multiplanar and multiecho pulse sequences of the cervical spine, to include the craniocervical junction and cervicothoracic junction, were obtained without and with intravenous contrast. CONTRAST:  10mL MULTIHANCE GADOBENATE DIMEGLUMINE 529 MG/ML IV SOLN COMPARISON:  Cervical spine radiographs 12/28/2017 FINDINGS: Alignment: Mild retrolisthesis C5-6.  Remaining alignment normal Vertebrae: Disc space narrowing at C5-6 with mild bone marrow edema and mild bone marrow enhancement. Given the ventral epidural fluid collection and dural enhancement this is most likely discitis and abscess. No fracture Cord: Mild swelling of the cord of the C5 and C6 level with mild cord edema due to infection and mild spinal stenosis Posterior Fossa, vertebral arteries, paraspinal tissues: Mild prevertebral soft tissue edema due to discitis. Disc levels: C2-3: Negative C3-4: Negative C4-5: Negative C5-6: Disc degeneration with disc space narrowing and spurring. Mild endplate irregularity and mild bone marrow edema. Ventral epidural nonenhancing process with surrounding dural enhancement most likely epidural abscess. Extruded disc fragment or hematoma are in the differential. There is circumferential dural thickening and enhancement with cord flattening and cord edema. C6-7: Epidural thickening from C5-6 extends down to the C6-7 disc space. Normal disc space at C6-7. C7-T1: Negative IMPRESSION: Disc degeneration and spurring at C5-6. Findings of diskitis at C5-6 with endplate irregularity and mild bone marrow edema enhancement. There is ventral epidural fluid collection with surrounding epidural enhancement surrounding the cord. Given the clinical information appearance this is most consistent with discitis with epidural abscess. There is  swelling of the cord with edema and mild spinal stenosis. These results were called by telephone at the time of interpretation on 12/28/2017 at 11:16 am to Dr. Daryel November , who verbally acknowledged these results. Electronically Signed   By: Marlan Palau M.D.   On: 12/28/2017 11:18    EKG:   Orders placed or performed during the hospital encounter of 12/28/17  . EKG 12-Lead  . EKG 12-Lead  . ED EKG  . ED EKG    ASSESSMENT AND PLAN:   42 year old female with history of tobacco dependence and drug use who presents with neck pain and found to have discitis and epidural abscess.  1.  Sepsis: Patient presents with tachycardia and leukocytosis.  Sepsis is from epidural abscess and discitis. NS started Follow CBC   2.  Epidural abscess/discitis: Continue vancomycin and Zosyn ID consultation is pending at this time Patient evaluated by neurosurgery while in the ER and recommended the interventional radiology to drain the abscess but as per Dr. Nada Boozer notes there is not enough fluid to drain the abscess.  As per my discussion with Dr. Roxy Horseman today biopsy cannot be done as the abscess is located in the anterior aspect.  This is discussed with Dr. Adriana Simas, neurosurgery who will discuss with Dr. Marcell Barlow his partner regarding the further plan of action Pain management as needed Blood cultures are negative so far  3.Tobacco dependence: Patient is encouraged to quit smoking. Counseling was provided for 4 minutes.  Provide nicotine patch  4.  History of IV drug use She reports no drug use for the past 18 to 24 months.      All the records are reviewed and case discussed with Care Management/Social Workerr. Management plans discussed with the patient, family and they are in agreement.  CODE STATUS: fc   TOTAL TIME TAKING CARE OF THIS PATIENT: .   POSSIBLE D/C 2-3 DAYS, DEPENDING ON CLINICAL  CONDITION.  Note: This dictation was prepared with Dragon dictation along  with smaller phrase technology. Any transcriptional errors that result from this process are unintentional.   Ramonita LabAruna Shaughnessy Gethers M.D on 12/29/2017 at 9:38 AM  Between 7am to 6pm - Pager - [phone] After 6pm go to [email or web address] - password [email or web address] [facility]  [facility]  Office  [phone]  CC: Primary care physician; Alberteen SpindleSciora, [name], CNM

## 2017-12-29 NOTE — Progress Notes (Signed)
Pharmacy Antibiotic Note  [name] is a 42 y.o. female admitted on 12/28/2017 with epidural abscess/discitis. Pharmacy has been consulted for Vancomycin and Zosyn dosing. ID consult is pending  Plan: Continue vancomycin 750 mg IV q12h. Goal trough 15-20 mcg/mL. Vanc trough to be collected 0829 @ 0900 Continue Zosyn 3.375 g IV q8h extended infusion  Height: 5\' 2"  (157.5 cm) Weight: 109 lb 9.1 oz (49.7 kg) IBW/kg (Calculated) : 50.1  Temp (24hrs), Avg:97.6 F (36.4 C), Min:97.4 F (36.3 C), Max:97.8 F (36.6 C)  Recent Labs  Lab 12/28/17 0736 12/28/17 0913  WBC 19.0*  --   CREATININE 0.70  --   LATICACIDVEN  --  1.3    Estimated Creatinine Clearance: 71.1 mL/min (by C-G formula based on SCr of 0.7 mg/dL).    No Known Allergies  Antimicrobials this admission: Vancomycin 8/27 >>  Zosyn 8/27 >>   Dose adjustments this admission:   Microbiology results: 8/27 BCx: NGTD  8/27 MRSA PCR: positive  Thank you for allowing pharmacy to be a part of this patient's care.  Pricilla RiffleAbby K Ellington, PharmD Pharmacy Resident  12/29/2017 8:38 AM

## 2017-12-29 NOTE — Progress Notes (Signed)
Per MD okay for RN to change iv fluids rate and re-order for one more day.

## 2017-12-29 NOTE — Progress Notes (Signed)
Family Meeting Note  Advance Directive:yes  Today a meeting took place with the Patient.    The following clinical team members were present during this meeting:MD  The following were discussed:Patient's diagnosis: Sepsis, epidural abscess, history of IV drug abuse, tobacco abuse, treatment plan of care discussed in detail with the patient.  Patient verbalized understanding of the plan.     Patient's progosis: Unable to determine and Goals for treatment: Full Code  Healthcare power of attorney patient's boyfriend Mr. Todd  Additional follow-up to be provided: Hospitalist, neurosurgery  Time spent during discussion:17 min  Ramonita LabAruna Shanara Schnieders, MD

## 2017-12-29 NOTE — Progress Notes (Signed)
Initial Nutrition Assessment  DOCUMENTATION CODES:   Non-severe (moderate) malnutrition in context of social or environmental circumstances  INTERVENTION:   - Provide Ensure Enlive po TID, each supplement provides 350 kcal and 20 grams of protein (vanilla flavor)  - MVI with minerals daily  NUTRITION DIAGNOSIS:   Moderate Malnutrition related to social/environmental circumstances (polysubstance abuse) as evidenced by mild fat depletion, moderate fat depletion, mild muscle depletion, moderate muscle depletion.  GOAL:   Patient will meet greater than or equal to 90% of their needs  MONITOR:   PO intake, Supplement acceptance, Weight trends, Labs  REASON FOR ASSESSMENT:   Malnutrition Screening Tool    ASSESSMENT:   42 year old female who presented to the ED with neck pain. MRI of the spine showed discitis and epidural abscess. 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  Per IR note, no collection is amendable for percutaneous drainage. Per neurosurgery, no urgent surgery is recommended.  Discussed pt with RN.  Spoke with pt at bedside who reports her main concern is pain and not receiving pain medications that help. Pt expresses frustration with her current situation. RD discussed with RN who reports having just provided pt with pain medication.  Pt states that her appetite is decreased currently due to pain. Pt reports eating less than usual over the past 9 months due to persistent "fevers and infection" and overall not feeling well. Noted untouched breakfast tray at pt's bedside.  Pt states that she typically eats 1 full meal daily that might include pizza. Pt endorses snacking throughout the day on candy and chips. Pt states she does not drink much water but does drink soda throughout the day.  Pt endorses progressive weight loss over the past 9 months. Pt reports her UBW as 125-130 lbs and that she last weighed this 9  months ago. Pt believes she weighs about 100 lbs currently. Per weight history in chart, pt has lost 4.6 lbs over the past 3 months. This is a 4.1% weight loss which is not significant for timeframe.  Medications reviewed and include: IV Zosyn, IV Vancocin  Labs reviewed: potassium 3.4 (L), hemoglobin 11.3 (L), HCT 34.0 (L)  NUTRITION - FOCUSED PHYSICAL EXAM:    Most Recent Value  Orbital Region  Mild depletion  Upper Arm Region  Mild depletion  Thoracic and Lumbar Region  Moderate depletion  Buccal Region  Mild depletion  Temple Region  No depletion  Clavicle Bone Region  Moderate depletion  Clavicle and Acromion Bone Region  Moderate depletion  Scapular Bone Region  Unable to assess  Dorsal Hand  No depletion  Patellar Region  Mild depletion  Anterior Thigh Region  Mild depletion  Posterior Calf Region  Mild depletion  Edema (RD Assessment)  None  Hair  Reviewed [pt reports hair falling out]  Eyes  Reviewed  Mouth  Reviewed [poor dentition]  Skin  Reviewed  Nails  Reviewed      Diet Order:   Diet Order            Diet regular Room service appropriate? Yes; Fluid consistency: Thin  Diet effective now              EDUCATION NEEDS:   No education needs have been identified at this time  Skin:  Skin Assessment: Reviewed RN Assessment (scattered abrasions)  Last BM:  12/28/17  Height:   Ht Readings from Last 1 Encounters:  12/28/17 5\' 2"  (1.575 m)  Weight:   Wt Readings from Last 1 Encounters:  12/28/17 49.7 kg    Ideal Body Weight:  50 kg  BMI:  Body mass index is 20.04 kg/m.  Estimated Nutritional Needs:   Kcal:  1550-1750 (31-35 kcal/kg)  Protein:  70-85 grams (1.4-1.7 g/kg)  Fluid:  1.6-1.8 L    Earma Reading, MS, RD, LDN Pager: [phone] Weekend/After Hours: [phone]

## 2017-12-29 NOTE — Progress Notes (Signed)
    Attending Progress Note  History: Andrea King is here for cervical epidural abscess. She complains of neck pain and R radiculitis, likely due to inflammation from her epidural abscess.  She does not have weakness.  Physical Exam: Vitals:   12/28/17 1947 12/29/17 1243  BP: 113/69 111/71  Pulse: 78 90  Resp: 18 14  Temp: (!) 97.4 F (36.3 C) 98.5 F (36.9 C)  SpO2: 100% 100%    AA Ox3 CNI  Strength:5/5 throughout BUE and BLE  Data:  Recent Labs  Lab 12/28/17 0736 12/29/17 0925  NA 137 138  K 3.4* 3.8  CL 101 111  CO2 25 19*  BUN 13 10  CREATININE 0.70 0.60  GLUCOSE 114* 114*  CALCIUM 8.7* 8.1*   Recent Labs  Lab 12/28/17 0736  AST 15  ALT 8  ALKPHOS 74     Recent Labs  Lab 12/28/17 0736 12/29/17 1209  WBC 19.0* 14.7*  HGB 11.3* 9.9*  HCT 34.0* 28.6*  PLT 309 289   No results for input(s): APTT, INR in the last 168 hours.       Other tests/results: cultures + for enterobacter and serratia in blood.   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  She currently has neck pain and radiculitis, but no signs of myelopathy or worsening weakness.  - would continue conservative management with IV abx guided by medicine/ID - we will follow.  No plan for surgery unless she develops worsening weakness or signs of myelopathy - PTOT and pain control as tolerated  Venetia Nighthester [name] MD, [facility] Department of Neurosurgery

## 2017-12-30 DIAGNOSIS — G062 Extradural and subdural abscess, unspecified: Secondary | ICD-10-CM

## 2017-12-30 LAB — CBC WITH DIFFERENTIAL/PLATELET
BASOS ABS: 0.1 10*3/uL (ref 0–0.1)
BASOS PCT: 1 %
EOS ABS: 0.1 10*3/uL (ref 0–0.7)
EOS PCT: 1 %
HCT: 31.1 % — ABNORMAL LOW (ref 35.0–47.0)
Hemoglobin: 10.2 g/dL — ABNORMAL LOW (ref 12.0–16.0)
Lymphocytes Relative: 10 %
Lymphs Abs: 1.4 10*3/uL (ref 1.0–3.6)
MCH: 26.7 pg (ref 26.0–34.0)
MCHC: 32.9 g/dL (ref 32.0–36.0)
MCV: 81.2 fL (ref 80.0–100.0)
MONO ABS: 0.7 10*3/uL (ref 0.2–0.9)
Monocytes Relative: 5 %
Neutro Abs: 12 10*3/uL — ABNORMAL HIGH (ref 1.4–6.5)
Neutrophils Relative %: 83 %
PLATELETS: 304 10*3/uL (ref 150–440)
RBC: 3.83 MIL/uL (ref 3.80–5.20)
RDW: 15.5 % — AB (ref 11.5–14.5)
WBC: 14.3 10*3/uL — ABNORMAL HIGH (ref 3.6–11.0)

## 2017-12-30 LAB — CREATININE, SERUM
CREATININE: 0.52 mg/dL (ref 0.44–1.00)
GFR calc Af Amer: >60 mL/min (ref >60)

## 2017-12-30 LAB — VANCOMYCIN, TROUGH: VANCOMYCIN TR: 6 ug/mL — AB (ref 15–20)

## 2017-12-30 MED ORDER — HYDROMORPHONE HCL 1 MG/ML IJ SOLN
2.0000 mg | INTRAMUSCULAR | Status: DC | PRN
  Administered 2017-12-30 – 2018-01-04 (×34): 2 mg via INTRAVENOUS
  Filled 2017-12-30 (×34): qty 2

## 2017-12-30 MED ORDER — HYDROMORPHONE HCL 1 MG/ML IJ SOLN
2.0000 mg | INTRAMUSCULAR | Status: DC | PRN
  Administered 2017-12-30: 2 mg via INTRAVENOUS
  Filled 2017-12-30: qty 2

## 2017-12-30 MED ORDER — PREGABALIN 75 MG PO CAPS
75.0000 mg | ORAL_CAPSULE | Freq: Two times a day (BID) | ORAL | Status: DC
  Administered 2017-12-30 – 2018-01-11 (×24): 75 mg via ORAL
  Filled 2017-12-30 (×25): qty 1

## 2017-12-30 MED ORDER — VANCOMYCIN HCL IN DEXTROSE 1-5 GM/200ML-% IV SOLN
1000.0000 mg | Freq: Three times a day (TID) | INTRAVENOUS | Status: DC
  Administered 2017-12-30 – 2017-12-31 (×3): 1000 mg via INTRAVENOUS
  Filled 2017-12-30 (×5): qty 200

## 2017-12-30 MED ORDER — OXYCODONE HCL ER 15 MG PO T12A
15.0000 mg | EXTENDED_RELEASE_TABLET | Freq: Two times a day (BID) | ORAL | Status: DC
  Administered 2017-12-30 – 2018-01-08 (×19): 15 mg via ORAL
  Filled 2017-12-30 (×19): qty 1

## 2017-12-30 NOTE — Plan of Care (Signed)
The patient has been stable with the increase in pain medication. No falls during this shift. IV antibiotics provided. DVT and PE prevention maintained.   Problem: Education: Goal: Knowledge of General Education information will improve Description Including pain rating scale, medication(s)/side effects and non-pharmacologic comfort measures Outcome: Progressing   Problem: Health Behavior/Discharge Planning: Goal: Ability to manage health-related needs will improve Outcome: Progressing   Problem: Clinical Measurements: Goal: Ability to maintain clinical measurements within normal limits will improve Outcome: Progressing Goal: Will remain free from infection Outcome: Progressing Goal: Diagnostic test results will improve Outcome: Progressing Goal: Respiratory complications will improve Outcome: Progressing Goal: Cardiovascular complication will be avoided Outcome: Progressing   Problem: Activity: Goal: Risk for activity intolerance will decrease Outcome: Progressing   Problem: Nutrition: Goal: Adequate nutrition will be maintained Outcome: Progressing   Problem: Coping: Goal: Level of anxiety will decrease Outcome: Progressing   Problem: Elimination: Goal: Will not experience complications related to bowel motility Outcome: Progressing Goal: Will not experience complications related to urinary retention Outcome: Progressing   Problem: Pain Managment: Goal: General experience of comfort will improve Outcome: Progressing   Problem: Safety: Goal: Ability to remain free from injury will improve Outcome: Progressing   Problem: Skin Integrity: Goal: Risk for impaired skin integrity will decrease Outcome: Progressing   Problem: Coping: Goal: Level of anxiety will decrease Outcome: Progressing   Problem: Elimination: Goal: Will not experience complications related to bowel motility Outcome: Progressing   Problem: Elimination: Goal: Will not experience  complications related to urinary retention Outcome: Progressing   Problem: Pain Managment: Goal: General experience of comfort will improve Outcome: Progressing   Problem: Safety: Goal: Ability to remain free from injury will improve Outcome: Progressing   Problem: Skin Integrity: Goal: Risk for impaired skin integrity will decrease Outcome: Progressing

## 2017-12-30 NOTE — Progress Notes (Addendum)
[facility] Physicians - [place] at [facility]   PATIENT NAME: Andrea King    MR#:  [id number]  DATE OF BIRTH:  1975-12-01  SUBJECTIVE:  CHIEF COMPLAINT: Patient continues to complain of severe pain in her back  REVIEW OF SYSTEMS:  CONSTITUTIONAL: No fever, fatigue or weakness.  EYES: No blurred or double vision.  EARS, NOSE, AND THROAT: No tinnitus or ear pain.  RESPIRATORY: No cough, shortness of breath, wheezing or hemoptysis.  CARDIOVASCULAR: No chest pain, orthopnea, edema.  GASTROINTESTINAL: No nausea, vomiting, diarrhea or abdominal pain.  GENITOURINARY: No dysuria, hematuria.  ENDOCRINE: No polyuria, nocturia,  HEMATOLOGY: No anemia, easy bruising or bleeding SKIN: No rash or lesion. MUSCULOSKELETAL: No joint pain or arthritis.  Reporting pain in the back of her neck NEUROLOGIC: No tingling, numbness, weakness.  PSYCHIATRY: No anxiety or depression.   DRUG ALLERGIES:  No Known Allergies  VITALS:  Blood pressure 121/69, pulse 82, temperature 98 F (36.7 C), resp. rate 16, height 5\' 2"  (1.575 m), weight 49.7 kg, last menstrual period 12/20/2017, SpO2 100 %.  PHYSICAL EXAMINATION:  GENERAL:  42 y.o.-year-old patient lying in the bed with no acute distress.  EYES: Pupils equal, round, reactive to light and accommodation. No scleral icterus. Extraocular muscles intact.  HEENT: Head atraumatic, normocephalic. Oropharynx and nasopharynx clear.  NECK:  Supple, no jugular venous distention. No thyroid enlargement, posterior cervical aspect of the neck is tender to touch with decreased range of motion of the neck LUNGS: Normal breath sounds bilaterally, no wheezing, rales,rhonchi or crepitation. No use of accessory muscles of respiration.  CARDIOVASCULAR: S1, S2 normal. No murmurs, rubs, or gallops.  ABDOMEN: Soft, nontender, nondistended. Bowel sounds present. No organomegaly or mass.  EXTREMITIES: No pedal edema, cyanosis, or clubbing.  NEUROLOGIC: Cranial  nerves II through XII are intact. Muscle strength 5/5 in all extremities. Sensation intact. Gait not checked.  PSYCHIATRIC: The patient is alert and oriented x 3.  SKIN: No obvious rash, lesion, or ulcer.    LABORATORY PANEL:   CBC Recent Labs  Lab 12/30/17 0913  WBC 14.3*  HGB 10.2*  HCT 31.1*  PLT 304   ------------------------------------------------------------------------------------------------------------------  Chemistries  Recent Labs  Lab 12/28/17 0736 12/29/17 0925 12/30/17 0913  NA 137 138  --   K 3.4* 3.8  --   CL 101 111  --   CO2 25 19*  --   GLUCOSE 114* 114*  --   BUN 13 10  --   CREATININE 0.70 0.60 0.52  CALCIUM 8.7* 8.1*  --   AST 15  --   --   ALT 8  --   --   ALKPHOS 74  --   --   BILITOT 0.7  --   --    ------------------------------------------------------------------------------------------------------------------  Cardiac Enzymes Recent Labs  Lab 12/28/17 0736  TROPONINI <0.03   ------------------------------------------------------------------------------------------------------------------  RADIOLOGY:  No results found.  EKG:   Orders placed or performed during the hospital encounter of 12/28/17  . EKG 12-Lead  . EKG 12-Lead  . ED EKG  . ED EKG    ASSESSMENT AND PLAN:   42 year old female with history of tobacco dependence and drug use who presents with neck pain and found to have discitis and epidural abscess.  1.  Sepsis: Patient presents with tachycardia and leukocytosis.  Sepsis is from epidural abscess and discitis. Continue IV antibiotic   2.  Epidural abscess/discitis: Continue vancomycin and Zosyn ID consultation done Patient evaluated by neurosurgery unable to drain or  biopsy this recommended antibiotics Also interventional radiology unable to biopsy or drain this lesion We will obtain a TEE as recommended per ID patient will likely need 6 weeks of IV antibiotics    3.Tobacco dependence: Patient is  encouraged to quit smoking. Counseling was provided for 4 minutes.  Provide nicotine patch  4.  History of IV drug use She reports no drug use for the past 18 to 24 months.      All the records are reviewed and case discussed with Care Management/Social Workerr. Management plans discussed with the patient, family and they are in agreement.  CODE STATUS: fc   TOTAL TIME TAKING CARE OF THIS PATIENT: 35minutes.   POSSIBLE D/C to be determined with IV drug use patient will likely need prolonged hospitalization  Note: This dictation was prepared with Dragon dictation along with smaller phrase technology. Any transcriptional errors that result from this process are unintentional.   Auburn BilberryShreyang Zaid Tomes M.D on 12/30/2017 at 1:16 PM  Between 7am to 6pm - Pager - [phone] After 6pm go to [email or web address] - password [email or web address] [facility]  [facility]  Office  [phone]  CC: Primary care physician; Alberteen SpindleSciora, [name], CNM

## 2017-12-30 NOTE — Progress Notes (Signed)
Pharmacy Antibiotic Note  Andrea L Troxleris a41 y.o.femaleadmittedon 8/27/2019with epidural abscess/discitis. Patient originally on vancomycin and Zosyn. ID consult, recommended switching Zosyn to cefepime for serratia bacteremia. Pharmacy has been consulted for vancomycindosing. Patient with history of MRSA bacteremia.  Plan: 8/29 0900 VT 6. Will increase vancomycin to 1000 mg IV q8h for goal trough 15-20 mcg/mL. Will start first dose at 1100 as 750 mg dose scheduled for 1000 was not administered per RN. Will order trough for 8/30 @ 1000 Continue cefepime 2 g IV q8h   Height: 5\' 2"  (157.5 cm) Weight: 109 lb 9.1 oz (49.7 kg) IBW/kg (Calculated) : 50.1  Temp (24hrs), Avg:98.7 F (37.1 C), Min:98.5 F (36.9 C), Max:98.9 F (37.2 C)  Recent Labs  Lab 12/28/17 0736 12/28/17 0913 12/29/17 0925 12/29/17 1209 12/30/17 0913  WBC 19.0*  --   --  14.7* 14.3*  CREATININE 0.70  --  0.60  --  0.52  LATICACIDVEN  --  1.3  --   --   --   VANCOTROUGH  --   --   --   --  6*    Estimated Creatinine Clearance: 71.1 mL/min (by C-G formula based on SCr of 0.52 mg/dL).    No Known Allergies  Antimicrobials this admission: Zosyn 8/27 >> 8/28 Vancomycin 8/27 >>  Cefepime 8/28 >>  Dose adjustments this admission: 8/28 Vanc 750 mg q12h >> 1000 mg q8h  Microbiology results: 8/27 BCx: serratia marcescens  8/27 MRSA PCR: positive  Thank you for allowing pharmacy to be a part of this patient's care.  Pricilla RiffleAbby K Lennox Leikam, PharmD Pharmacy Resident  12/30/2017 10:31 AM

## 2017-12-30 NOTE — Progress Notes (Signed)
    Attending Progress Note  History: Andrea King is here for cervical epidural abscess. She complains of neck pain and R radiculitis, likely due to inflammation from her epidural abscess.  She does not have weakness.  She has not had any chance in symptoms since yesterday.  Physical Exam: Vitals:   12/29/17 1927 12/30/17 1020  BP: (!) 119/59 121/69  Pulse: 82 82  Resp: 18 16  Temp: 98.9 F (37.2 C) 98 F (36.7 C)  SpO2: 100% 100%    AA Ox3 CNI  Strength:5/5 throughout BUE and BLE  Data:  Recent Labs  Lab 12/28/17 0736 12/29/17 0925 12/30/17 0913  NA 137 138  --   K 3.4* 3.8  --   CL 101 111  --   CO2 25 19*  --   BUN 13 10  --   CREATININE 0.70 0.60 0.52  GLUCOSE 114* 114*  --   CALCIUM 8.7* 8.1*  --    Recent Labs  Lab 12/28/17 0736  AST 15  ALT 8  ALKPHOS 74     Recent Labs  Lab 12/28/17 0736 12/29/17 1209 12/30/17 0913  WBC 19.0* 14.7* 14.3*  HGB 11.3* 9.9* 10.2*  HCT 34.0* 28.6* 31.1*  PLT 309 289 304   No results for input(s): APTT, INR in the last 168 hours.       Other tests/results: cultures + for enterobacter and serratia in blood.   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  She currently has neck pain and radiculitis, but no signs of myelopathy or worsening weakness.   Plan as below: - would continue conservative management with IV abx guided by medicine/ID - we will follow.  No plan for surgery unless she develops worsening weakness or signs of myelopathy - PTOT and pain control as tolerated  Andrea Andrea Nyellie Yetter MD, [facility] Department of Neurosurgery

## 2017-12-30 NOTE — Consult Note (Signed)
Reason for Consult: Cervical epidural abcess Referring Physician: Adrian Saran, MD  CC: neck pain radiating to shoulder and fever  HPI: Andrea King is an 42 y.o. 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 She denies trauma to her neck. She denies weakness of her extremities or loss of grip, difficulty with fine motor skill, loss of balance or incoordination. MRI of the cervical spine was performed  which showed discitis C5-C6 with some ventral fluid collection. She was admitted for further evaluation and started on Vanco and Zosyn.  Past Medical History:  Diagnosis Date  . Anemia   . Asthma   . DJD (degenerative joint disease)   . Hepatitis C   . Substance abuse [facility])     Past Surgical History:  Procedure Laterality Date  . INGUINAL HERNIA REPAIR Left 04/28/2013   Procedure: HERNIA REPAIR INGUINAL ADULT with mesh;  Surgeon: Cherylynn Ridges, MD;  Location: [facility] OR;  Service: General;  Laterality: Left;  . TEE WITHOUT CARDIOVERSION N/A 11/18/2015   Procedure: TRANSESOPHAGEAL ECHOCARDIOGRAM (TEE);  Surgeon: Iran Ouch, MD;  Location: [facility] ORS;  Service: Cardiovascular;  Laterality: N/A;  . TUMOR EXCISION     left scalpula    Family History  Problem Relation Age of Onset  . Cancer Mother   . Cancer - Colon Maternal Grandfather   . Diabetes Maternal Grandfather     Social History:  reports that she has been smoking cigarettes. She has never used smokeless tobacco. She reports that she has current or past drug history. She reports that she does not drink alcohol.  No Known Allergies  Medications:  I have reviewed the patient's current medications. Prior to Admission:  Medications Prior to Admission  Medication Sig Dispense Refill Last Dose  . acetaminophen (TYLENOL) 500 MG tablet Take 500-1,000 mg by mouth every 6 (six)  hours as needed for mild pain or moderate pain.   PRN at PRN  . ibuprofen (ADVIL,MOTRIN) 200 MG tablet Take 600 mg by mouth every 6 (six) hours as needed (for pain or headaches).    PRN at PRN   Scheduled: . Chlorhexidine Gluconate Cloth  6 each Topical Q0600  . enoxaparin (LOVENOX) injection  40 mg Subcutaneous Q24H  . feeding supplement (ENSURE ENLIVE)  237 mL Oral TID BM  . multivitamin with minerals  1 tablet Oral Daily  . mupirocin ointment  1 application Nasal BID  . nicotine  21 mg Transdermal Daily  . oxyCODONE  15 mg Oral Q12H  . pregabalin  75 mg Oral BID    ROS: History obtained from the patient   General ROS: negative for - chills, fatigue, fever, night sweats, weight gain or weight loss Psychological ROS: negative for - behavioral disorder, hallucinations, memory difficulties, mood swings or suicidal ideation Ophthalmic ROS: negative for - blurry vision, double vision, eye pain or loss of vision ENT ROS: negative for - epistaxis, nasal discharge, oral lesions, sore throat, tinnitus or vertigo Allergy and Immunology ROS: negative for - hives or itchy/watery eyes Hematological and Lymphatic ROS: negative for - bleeding problems, bruising or swollen lymph nodes Endocrine ROS: negative for - galactorrhea, hair pattern changes, polydipsia/polyuria or temperature intolerance Respiratory ROS: negative for - cough, hemoptysis, shortness of breath or wheezing Cardiovascular ROS: negative for - chest pain, dyspnea on exertion, edema or irregular heartbeat Gastrointestinal ROS: negative  for - abdominal pain, diarrhea, hematemesis, nausea/vomiting or stool incontinence Genito-Urinary ROS: negative for - dysuria, hematuria, incontinence or urinary frequency/urgency Musculoskeletal ROS: negative for - joint swelling or muscular weakness Neurological ROS: as noted in HPI Dermatological ROS: negative for rash and skin lesion changes  Physical Exam   Vitals Blood pressure 121/69, pulse  82, temperature 98 F (36.7 C), resp. rate 16, height 5\' 2"  (1.575 m), weight 49.7 kg, last menstrual period 12/20/2017, SpO2 100 %.   General Exam . Patient looks appropriate of age, well built, nourished and appropriately groomed.  . Cardiovascular Exam: S1, S2 heart sounds present  . Carotid exam revealed no bruit  . Lung exam was clear to auscultation   Neurological Exam . Alert,  . Oriented to time, place, person  . Attention span and concentration seemed appropriate  . Language seemed intact (naming, spontaneous speech, comprehension)  . I. Olfactory not examined . II: Visual fields were full. Pupils were equal, round and reactive to light and accommodation . III,IV, VI: ptosis not present, extra-ocular motions intact bilaterally . V,VII: smile symmetric, facial light touch sensation normal bilaterally . VIII: Finger rub was heard symmetric in both ears . IX, X: Palate and uvular movements are normal and oral sensations are OK, gag reflex deffered . XI: Neck muscle strength and shoulder shrug is normal . XII: midline tongue extension . Tone is normal in all extremities, no abnormal movements seen  . Muscle strength in all extremities seemed normal. 5/5 . Deep tendon reflexes were symmetric 3+ bilaterally . Sensations were intact to light touch in all extremities except left fingers . Gait and station are not tested as she is lying in bed complaining of pain   Laboratory Studies:   Basic Metabolic Panel: Recent Labs  Lab 12/28/17 0736 12/29/17 0925 12/30/17 0913  NA 137 138  --   K 3.4* 3.8  --   CL 101 111  --   CO2 25 19*  --   GLUCOSE 114* 114*  --   BUN 13 10  --   CREATININE 0.70 0.60 0.52  CALCIUM 8.7* 8.1*  --     Liver Function Tests: Recent Labs  Lab 12/28/17 0736  AST 15  ALT 8  ALKPHOS 74  BILITOT 0.7  PROT 7.9  ALBUMIN 3.5   No results for input(s): LIPASE, AMYLASE in the last 168 hours. No results for input(s): AMMONIA in the last 168  hours.  CBC: Recent Labs  Lab 12/28/17 0736 12/29/17 1209 12/30/17 0913  WBC 19.0* 14.7* 14.3*  NEUTROABS 15.9*  --  12.0*  HGB 11.3* 9.9* 10.2*  HCT 34.0* 28.6* 31.1*  MCV 81.9 80.2 81.2  PLT 309 289 304    Cardiac Enzymes: Recent Labs  Lab 12/28/17 0736  TROPONINI <0.03    BNP: Invalid input(s): POCBNP  CBG: No results for input(s): GLUCAP in the last 168 hours.  Microbiology: Results for orders placed or performed during the hospital encounter of 12/28/17  Blood culture (routine x 2)     Status: None (Preliminary result)   Collection Time: 12/28/17  9:13 AM  Result Value Ref Range Status   Specimen Description   Final    BLOOD RIGHT ARM Performed at [facility], [address], [place], [place] [address]    Special Requests NONE  Final   Culture  Setup Time   Final    GRAM NEGATIVE RODS AEROBIC BOTTLE ONLY CRITICAL VALUE NOTED.  VALUE IS CONSISTENT WITH PREVIOUSLY  REPORTED AND CALLED VALUE. JAG Performed at [facility], [address] [place], [place] [address]    Culture GRAM NEGATIVE RODS  Final   Report Status PENDING  Incomplete  Blood culture (routine x 2)     Status: Abnormal (Preliminary result)   Collection Time: 12/28/17  9:13 AM  Result Value Ref Range Status   Specimen Description   Final    BLOOD Performed at [facility], [address] [place], [place] [address]    Special Requests   Final    NONE Performed at [facility], [address] [place], [place] [address]    Culture  Setup Time   Final    Organism ID to follow GRAM NEGATIVE RODS IN BOTH AEROBIC AND ANAEROBIC BOTTLES CRITICAL RESULT CALLED TO, READ BACK BY AND VERIFIED WITH: KAREN HAYES AT 1135 12/29/17 SDR Performed at [facility], [address], [place], [place] [address]    Culture (A)  Final    SERRATIA MARCESCENS SUSCEPTIBILITIES TO FOLLOW Performed at [facility], [address] [place], [place] [address]     Report Status PENDING  Incomplete  Blood Culture ID Panel (Reflexed)     Status: Abnormal   Collection Time: 12/28/17  9:13 AM  Result Value Ref Range Status   Enterococcus species NOT DETECTED NOT DETECTED Final   Listeria monocytogenes NOT DETECTED NOT DETECTED Final   Staphylococcus species NOT DETECTED NOT DETECTED Final   Staphylococcus aureus NOT DETECTED NOT DETECTED Final   Streptococcus species NOT DETECTED NOT DETECTED Final   Streptococcus agalactiae NOT DETECTED NOT DETECTED Final   Streptococcus pneumoniae NOT DETECTED NOT DETECTED Final   Streptococcus pyogenes NOT DETECTED NOT DETECTED Final   Acinetobacter baumannii NOT DETECTED NOT DETECTED Final   Enterobacteriaceae species DETECTED (A) NOT DETECTED Final    Comment: Enterobacteriaceae represent a large family of gram-negative bacteria, not a single organism. CRITICAL RESULT CALLED TO, READ BACK BY AND VERIFIED WITH:  KAREN HAYES AT 1135 12/29/17 SDR    Enterobacter cloacae complex NOT DETECTED NOT DETECTED Final   Escherichia coli NOT DETECTED NOT DETECTED Final   Klebsiella oxytoca NOT DETECTED NOT DETECTED Final   Klebsiella pneumoniae NOT DETECTED NOT DETECTED Final   Proteus species NOT DETECTED NOT DETECTED Final   Serratia marcescens DETECTED (A) NOT DETECTED Final    Comment: CRITICAL RESULT CALLED TO, READ BACK BY AND VERIFIED WITH:  KAREN HAYES AT 1135 12/29/17 SDR    Carbapenem resistance NOT DETECTED NOT DETECTED Final   Haemophilus influenzae NOT DETECTED NOT DETECTED Final   Neisseria meningitidis NOT DETECTED NOT DETECTED Final   Pseudomonas aeruginosa NOT DETECTED NOT DETECTED Final   Candida albicans NOT DETECTED NOT DETECTED Final   Candida glabrata NOT DETECTED NOT DETECTED Final   Candida krusei NOT DETECTED NOT DETECTED Final   Candida parapsilosis NOT DETECTED NOT DETECTED Final   Candida tropicalis NOT DETECTED NOT DETECTED Final    Comment: Performed at [facility], [address] [place], [place] [address]  MRSA PCR Screening     Status: Abnormal   Collection Time: 12/28/17  2:36 PM  Result Value Ref Range Status   MRSA by PCR POSITIVE (A) NEGATIVE Final    Comment:        The GeneXpert MRSA Assay (FDA approved for NASAL specimens only), is one component of a comprehensive MRSA colonization surveillance program. It is not intended to diagnose MRSA infection nor to guide or  monitor treatment for MRSA infections. RESULT CALLED TO, READ BACK BY AND VERIFIED WITH: BETH RICHARDSON AT 1610 ON 12/28/17 BY SNJ Performed at [facility], [address] [place], [place] [address]     Coagulation Studies: No results for input(s): LABPROT, INR in the last 72 hours.  Urinalysis:  Recent Labs  Lab 12/29/17 0006  COLORURINE YELLOW*  LABSPEC 1.011  PHURINE 6.0  GLUCOSEU NEGATIVE  HGBUR SMALL*  BILIRUBINUR NEGATIVE  KETONESUR NEGATIVE  PROTEINUR NEGATIVE  NITRITE NEGATIVE  LEUKOCYTESUR NEGATIVE    Lipid Panel:  No results found for: CHOL, TRIG, HDL, CHOLHDL, VLDL, LDLCALC  HgbA1C: No results found for: HGBA1C  Urine Drug Screen:      Component Value Date/Time   LABOPIA POSITIVE (A) 12/29/2017 0006   LABOPIA POSITIVE (A) 09/20/2017 1751   COCAINSCRNUR POSITIVE (A) 12/29/2017 0006   LABBENZ TEST NOT PERFORMED, REAGENT NOT AVAILABLE (A) 12/29/2017 0006   LABBENZ POSITIVE (A) 09/20/2017 1751   AMPHETMU NONE DETECTED 12/29/2017 0006   AMPHETMU NONE DETECTED 09/20/2017 1751   THCU NONE DETECTED 12/29/2017 0006   THCU NONE DETECTED 09/20/2017 1751   LABBARB NONE DETECTED 12/29/2017 0006   LABBARB NONE DETECTED 09/20/2017 1751    Alcohol Level: No results for input(s): ETH in the last 168 hours.  Other results: EKG: normal EKG, normal sinus rhythm, unchanged from previous tracings.  Imaging: CLINICAL DATA:  Neck pain fever leukocytosis. History of IV drug abuse  EXAM: MRI CERVICAL SPINE WITHOUT AND WITH  CONTRAST  TECHNIQUE: Multiplanar and multiecho pulse sequences of the cervical spine, to include the craniocervical junction and cervicothoracic junction, were obtained without and with intravenous contrast.  CONTRAST:  10mL MULTIHANCE GADOBENATE DIMEGLUMINE 529 MG/ML IV SOLN  COMPARISON:  Cervical spine radiographs 12/28/2017  FINDINGS: Alignment: Mild retrolisthesis C5-6.  Remaining alignment normal  Vertebrae: Disc space narrowing at C5-6 with mild bone marrow edema and mild bone marrow enhancement. Given the ventral epidural fluid collection and dural enhancement this is most likely discitis and abscess. No fracture  Cord: Mild swelling of the cord of the C5 and C6 level with mild cord edema due to infection and mild spinal stenosis  Posterior Fossa, vertebral arteries, paraspinal tissues: Mild prevertebral soft tissue edema due to discitis.  Disc levels:  C2-3: Negative  C3-4: Negative  C4-5: Negative  C5-6: Disc degeneration with disc space narrowing and spurring. Mild endplate irregularity and mild bone marrow edema. Ventral epidural nonenhancing process with surrounding dural enhancement most likely epidural abscess. Extruded disc fragment or hematoma are in the differential. There is circumferential dural thickening and enhancement with cord flattening and cord edema.  C6-7: Epidural thickening from C5-6 extends down to the C6-7 disc space. Normal disc space at C6-7.  C7-T1: Negative  IMPRESSION: Disc degeneration and spurring at C5-6. Findings of diskitis at C5-6 with endplate irregularity and mild bone marrow edema enhancement. There is ventral epidural fluid collection with surrounding epidural enhancement surrounding the cord. Given the clinical information appearance this is most consistent with discitis with epidural abscess. There is swelling of the cord with edema and mild spinal stenosis.  These results were called by telephone  at the time of interpretation on 12/28/2017 at 11:16 am to Dr. Daryel November , who verbally acknowledged these results.   Electronically Signed   By: Marlan Palau M.D.   On: 12/28/2017 11:18  Patient seen and examined.  Clinical course and management discussed.  Necessary edits performed.  I agree with the above.  Assessment  and plan of care developed and discussed below.     Assessment/Plan:   Ms. Orland Jarredroxler is a 42 y.o. female presenting with cervical radiculitis likely secondary to epidural abscess. MRI cervical spine personally reviewed which showed discitis C5-C6 with some ventral fluid collection. Exam is reassuring with no signs of myelopathy. She has been evaluated by Neurosurgery as well who does not recommend urgent surgical intervention at this time. She is currently on IV antibiotics per ID and pain management.  1. Agree with current plan per ID and Neurosurgery. No further imaging or work up from neurology stand point. If further questions arise, please call or page at that time.     This patient was staffed with Dr. Verlon AuLeslie, Thad Rangereynolds who personally evaluated patient, reviewed documentation and agreed with assessment and plan of care as above.  Webb SilversmithElizabeth Ouma, DNP, FNP-BC Board certified Nurse Practitioner Neurology Department  12/30/2017, 12:11 PM     Thana FarrLeslie Emileigh Kellett, MD Neurology [phone]  12/30/2017  1:52 PM

## 2017-12-30 NOTE — Consult Note (Signed)
[facility] Cardiology  CARDIOLOGY CONSULT NOTE  Patient ID: RILDA BULLS MRN: [id number] DOB/AGE: [birth year] 42 y.o.  Admit date: 12/28/2017 Referring Physician Dr. Auburn Bilberry Primary Physician Dr. Arnetha Courser Primary Cardiologist [facility]  Reason for Consultation TEE to evaluate for endocarditis   HPI: Ms. Ozturk is a 42 year old female with past medical history significant for IV drug abuse, hx of endocarditis s/p tricuspid valvuloplasty, Hepatitis C, bipolar disorder, and tobacco abuse who presented to the ED on 12/28/17 for a 1 week history of 10/10 neck/back pain with radiation to both shoulders. While in the ED, MRI was performed which showed an epidural abscess. IV antibiotics was recommended and IR was consulted for potential drainage. Cardiology consulted for TEE to evaluate for endocarditis.   Today, the patient reports that she is experiencing severe neck and back pain, worse with movement, with associated shortness of breath.  Denies chest pain, palpitations, lower extremity swelling.  Also denies current dizziness, lightheadedness, nausea, vomiting, or diarrhea. History of IV drug abuse, but reports she is no longer using. Does admit to smoking 1 pack of cigarettes/day. Denies alcohol consumption.   Underwent tricuspid valvuloplasty on 03/02/2016 at [facility].  Has not been seen in outpatient cardiology since 2017.  No additional significant cardiac history to report.   Review of systems complete and found to be negative unless listed above     Past Medical History:  Diagnosis Date  . Anemia   . Asthma   . DJD (degenerative joint disease)   . Hepatitis C   . Substance abuse [facility])     Past Surgical History:  Procedure Laterality Date  . INGUINAL HERNIA REPAIR Left 04/28/2013   Procedure: HERNIA REPAIR INGUINAL ADULT with mesh;  Surgeon: Cherylynn Ridges, MD;  Location: [facility] OR;  Service: General;  Laterality: Left;  . TEE WITHOUT CARDIOVERSION N/A 11/18/2015   Procedure:  TRANSESOPHAGEAL ECHOCARDIOGRAM (TEE);  Surgeon: Iran Ouch, MD;  Location: [facility] ORS;  Service: Cardiovascular;  Laterality: N/A;  . TUMOR EXCISION     left scalpula    Medications Prior to Admission  Medication Sig Dispense Refill Last Dose  . acetaminophen (TYLENOL) 500 MG tablet Take 500-1,000 mg by mouth every 6 (six) hours as needed for mild pain or moderate pain.   PRN at PRN  . ibuprofen (ADVIL,MOTRIN) 200 MG tablet Take 600 mg by mouth every 6 (six) hours as needed (for pain or headaches).    PRN at PRN   Social History   Socioeconomic History  . Marital status: Single    Spouse name: Not on file  . Number of children: Not on file  . Years of education: Not on file  . Highest education level: Not on file  Occupational History  . Occupation: unemployed  Social Needs  . Financial resource strain: Not on file  . Food insecurity:    Worry: Not on file    Inability: Not on file  . Transportation needs:    Medical: Not on file    Non-medical: Not on file  Tobacco Use  . Smoking status: Current Every Day Smoker    Types: Cigarettes  . Smokeless tobacco: Never Used  Substance and Sexual Activity  . Alcohol use: No    Alcohol/week: 0.0 standard drinks  . Drug use: Yes    Comment: cocaine,Heroine  . Sexual activity: Yes    Birth control/protection: None  Lifestyle  . Physical activity:    Days per week: Not on file  Minutes per session: Not on file  . Stress: Not on file  Relationships  . Social connections:    Talks on phone: Not on file    Gets together: Not on file    Attends religious service: Not on file    Active member of club or organization: Not on file    Attends meetings of clubs or organizations: Not on file    Relationship status: Not on file  . Intimate partner violence:    Fear of current or ex partner: Not on file    Emotionally abused: Not on file    Physically abused: Not on file    Forced sexual activity: Not on file  Other Topics  Concern  . Not on file  Social History Narrative  . Not on file    Family History  Problem Relation Age of Onset  . Cancer Mother   . Cancer - Colon Maternal Grandfather   . Diabetes Maternal Grandfather       Review of systems complete and found to be negative unless listed above      PHYSICAL EXAM  General: Well developed, well nourished, in no acute distress HEENT:  Normocephalic and atramatic Neck:  No JVD.  Lungs: Clear bilaterally to auscultation and percussion. Heart: HRRR . Normal S1 and S2 without gallops or murmurs. Sternotomy scar appreciated Abdomen: Bowel sounds are positive, abdomen soft and non-tender  Msk:  Back normal. Normal strength and tone for age.  Gait not assessed Extremities: No clubbing, cyanosis or edema.   Neuro: Alert and oriented X 3. Psych:  Good affect, responds appropriately  Labs:   Lab Results  Component Value Date   WBC 14.3 (H) 12/30/2017   HGB 10.2 (L) 12/30/2017   HCT 31.1 (L) 12/30/2017   MCV 81.2 12/30/2017   PLT 304 12/30/2017    Recent Labs  Lab 12/28/17 0736 12/29/17 0925 12/30/17 0913  NA 137 138  --   K 3.4* 3.8  --   CL 101 111  --   CO2 25 19*  --   BUN 13 10  --   CREATININE 0.70 0.60 0.52  CALCIUM 8.7* 8.1*  --   PROT 7.9  --   --   BILITOT 0.7  --   --   ALKPHOS 74  --   --   ALT 8  --   --   AST 15  --   --   GLUCOSE 114* 114*  --    Lab Results  Component Value Date   TROPONINI <0.03 12/28/2017   No results found for: CHOL No results found for: HDL No results found for: LDLCALC No results found for: TRIG No results found for: CHOLHDL No results found for: LDLDIRECT    Radiology: Dg Cervical Spine Complete  Result Date: 12/28/2017 CLINICAL DATA:  Posterior neck pain extending into the shoulders. EXAM: CERVICAL SPINE - COMPLETE 4+ VIEW COMPARISON:  None. FINDINGS: The cervical spine is visualized the skull base through the cervicothoracic junction. Prevertebral soft tissues are within normal  limits. Slight degenerative retrolisthesis is present at C5-6. There is uncovertebral spurring at C5-6. This leads to some degree of foraminal narrowing bilaterally, potentially impacting the C6 nerve roots. The patient is status post median sternotomy. Surgical clips are also present over the left shoulder. IMPRESSION: 1. Focal degenerative disc disease evident at C5-6 with possible foraminal narrowing bilaterally due to uncovertebral spurring. This could impact the C6 nerve roots on either side. 2. No acute abnormality.  Electronically Signed   By: Marin Robertshristopher  Mattern M.D.   On: 12/28/2017 08:45   Mr Cervical Spine W Or Wo Contrast  Result Date: 12/28/2017 CLINICAL DATA:  Neck pain fever leukocytosis. History of IV drug abuse EXAM: MRI CERVICAL SPINE WITHOUT AND WITH CONTRAST TECHNIQUE: Multiplanar and multiecho pulse sequences of the cervical spine, to include the craniocervical junction and cervicothoracic junction, were obtained without and with intravenous contrast. CONTRAST:  10mL MULTIHANCE GADOBENATE DIMEGLUMINE 529 MG/ML IV SOLN COMPARISON:  Cervical spine radiographs 12/28/2017 FINDINGS: Alignment: Mild retrolisthesis C5-6.  Remaining alignment normal Vertebrae: Disc space narrowing at C5-6 with mild bone marrow edema and mild bone marrow enhancement. Given the ventral epidural fluid collection and dural enhancement this is most likely discitis and abscess. No fracture Cord: Mild swelling of the cord of the C5 and C6 level with mild cord edema due to infection and mild spinal stenosis Posterior Fossa, vertebral arteries, paraspinal tissues: Mild prevertebral soft tissue edema due to discitis. Disc levels: C2-3: Negative C3-4: Negative C4-5: Negative C5-6: Disc degeneration with disc space narrowing and spurring. Mild endplate irregularity and mild bone marrow edema. Ventral epidural nonenhancing process with surrounding dural enhancement most likely epidural abscess. Extruded disc fragment or hematoma  are in the differential. There is circumferential dural thickening and enhancement with cord flattening and cord edema. C6-7: Epidural thickening from C5-6 extends down to the C6-7 disc space. Normal disc space at C6-7. C7-T1: Negative IMPRESSION: Disc degeneration and spurring at C5-6. Findings of diskitis at C5-6 with endplate irregularity and mild bone marrow edema enhancement. There is ventral epidural fluid collection with surrounding epidural enhancement surrounding the cord. Given the clinical information appearance this is most consistent with discitis with epidural abscess. There is swelling of the cord with edema and mild spinal stenosis. These results were called by telephone at the time of interpretation on 12/28/2017 at 11:16 am to Dr. Daryel NovemberJONATHAN WILLIAMS , who verbally acknowledged these results. Electronically Signed   By: Marlan Palauharles  Clark M.D.   On: 12/28/2017 11:18    EKG: Sinus tachycardia with incomplete RBBB; no evidence of acute ischemia   ASSESSMENT AND PLAN:  1.  Epidural abscess  -Agree with current plan of vancomycin and Zosyn   -IR consulted for potential drainage 2.  History of endocarditis s/p tricuspid valvuloplasty  -TEE ordered to evaluate 3.  Tobacco abuse  -Smoking cessation strongly encouraged 4.  History of IV drug abuse  -No longer using per patient    The history, physical exam findings, and plan of care were all discussed with Dr. Harold HedgeKenneth Angle Dirusso and all decision-making was made in collaboration.  Signed: Andi HenceNicole L Stephens PA-C 12/30/2017, 10:15 AM

## 2017-12-31 ENCOUNTER — Inpatient Hospital Stay: Payer: Self-pay

## 2017-12-31 ENCOUNTER — Encounter
Admission: EM | Discharge status: Against Medical Advice | Payer: Self-pay | Source: Home / Self Care | Attending: Internal Medicine

## 2017-12-31 HISTORY — PX: TEE WITHOUT CARDIOVERSION: SHX5443

## 2017-12-31 LAB — BASIC METABOLIC PANEL
Anion gap: 6 (ref 5–15)
BUN: 8 mg/dL (ref 6–20)
CO2: 25 mmol/L (ref 22–32)
Calcium: 8.1 mg/dL — ABNORMAL LOW (ref 8.9–10.3)
Chloride: 101 mmol/L (ref 98–111)
Creatinine, Ser: 0.48 mg/dL (ref 0.44–1.00)
GFR calc Af Amer: >60 mL/min (ref >60)
GFR calc non Af Amer: >60 mL/min (ref >60)
Glucose, Bld: 131 mg/dL — ABNORMAL HIGH (ref 70–99)
Potassium: 3.6 mmol/L (ref 3.5–5.1)
Sodium: 132 mmol/L — ABNORMAL LOW (ref 135–145)

## 2017-12-31 LAB — CULTURE, BLOOD (ROUTINE X 2)

## 2017-12-31 LAB — CBC
HCT: 27.1 % — ABNORMAL LOW (ref 35.0–47.0)
Hemoglobin: 9 g/dL — ABNORMAL LOW (ref 12.0–16.0)
MCH: 26.7 pg (ref 26.0–34.0)
MCHC: 33.1 g/dL (ref 32.0–36.0)
MCV: 80.7 fL (ref 80.0–100.0)
Platelets: 283 10*3/uL (ref 150–440)
RBC: 3.36 MIL/uL — ABNORMAL LOW (ref 3.80–5.20)
RDW: 15.5 % — ABNORMAL HIGH (ref 11.5–14.5)
WBC: 12.7 10*3/uL — ABNORMAL HIGH (ref 3.6–11.0)

## 2017-12-31 LAB — VANCOMYCIN, TROUGH: VANCOMYCIN TR: 15 ug/mL (ref 15–20)

## 2017-12-31 SURGERY — ECHOCARDIOGRAM, TRANSESOPHAGEAL
Anesthesia: Moderate Sedation

## 2017-12-31 MED ORDER — FENTANYL CITRATE (PF) 100 MCG/2ML IJ SOLN
50.0000 ug | Freq: Once | INTRAMUSCULAR | Status: AC
  Administered 2017-12-31: 50 ug via INTRAVENOUS

## 2017-12-31 MED ORDER — BUTAMBEN-TETRACAINE-BENZOCAINE 2-2-14 % EX AERO
INHALATION_SPRAY | CUTANEOUS | Status: AC
  Filled 2017-12-31: qty 5

## 2017-12-31 MED ORDER — SODIUM CHLORIDE 0.9% FLUSH
10.0000 mL | INTRAVENOUS | Status: DC | PRN
  Administered 2017-12-31 – 2018-01-03 (×2): 10 mL
  Filled 2017-12-31 (×2): qty 40

## 2017-12-31 MED ORDER — SODIUM CHLORIDE FLUSH 0.9 % IV SOLN
INTRAVENOUS | Status: AC
  Filled 2017-12-31: qty 10

## 2017-12-31 MED ORDER — SODIUM CHLORIDE 0.9% FLUSH
10.0000 mL | Freq: Two times a day (BID) | INTRAVENOUS | Status: DC
  Administered 2017-12-31 – 2018-01-11 (×22): 10 mL

## 2017-12-31 MED ORDER — SODIUM CHLORIDE 0.9 % IV SOLN
INTRAVENOUS | Status: DC
  Administered 2017-12-31: 09:00:00 via INTRAVENOUS

## 2017-12-31 MED ORDER — FENTANYL CITRATE (PF) 100 MCG/2ML IJ SOLN
INTRAMUSCULAR | Status: AC
  Filled 2017-12-31: qty 2

## 2017-12-31 MED ORDER — MIDAZOLAM HCL 5 MG/5ML IJ SOLN
INTRAMUSCULAR | Status: AC
  Filled 2017-12-31: qty 5

## 2017-12-31 MED ORDER — LIDOCAINE VISCOUS HCL 2 % MT SOLN
OROMUCOSAL | Status: AC
  Filled 2017-12-31: qty 15

## 2017-12-31 NOTE — Plan of Care (Signed)
Patient pain level will be below a 5 before discharge

## 2017-12-31 NOTE — Progress Notes (Addendum)
[facility] [place] [facility]   PATIENT NAME: Andrea MannsRaina King    MR#:  [id number]  DATE OF BIRTH:  [birth year]  SUBJECTIVE:  CHIEF COMPLAINT: Pain under better control and had poor IV control and be unable to do TEE today REVIEW OF SYSTEMS:  CONSTITUTIONAL: No fever, fatigue or weakness.  EYES: No blurred or double vision.  EARS, NOSE, AND THROAT: No tinnitus or ear pain.  RESPIRATORY: No cough, shortness of breath, wheezing or hemoptysis.  CARDIOVASCULAR: No chest pain, orthopnea, edema.  GASTROINTESTINAL: No nausea, vomiting, diarrhea or abdominal pain.  GENITOURINARY: No dysuria, hematuria.  ENDOCRINE: No polyuria, nocturia,  HEMATOLOGY: No anemia, easy bruising or bleeding SKIN: No rash or lesion. MUSCULOSKELETAL: No joint pain or arthritis.  Reporting pain in the back of her neck NEUROLOGIC: No tingling, numbness, weakness.  PSYCHIATRY: No anxiety or depression.   DRUG ALLERGIES:  No Known Allergies  VITALS:  Blood pressure 114/71, pulse 83, temperature 98.4 F (36.9 C), resp. rate 18, height 5\' 2"  (1.575 m), weight 49.7 kg, last menstrual period 12/20/2017, SpO2 97 %.  PHYSICAL EXAMINATION:  GENERAL:  42 y.o.-year-old patient lying in the bed with no acute distress.  EYES: Pupils equal, round, reactive to light and accommodation. No scleral icterus. Extraocular muscles intact.  HEENT: Head atraumatic, normocephalic. Oropharynx and nasopharynx clear.  NECK:  Supple, no jugular venous distention. No thyroid enlargement, posterior cervical aspect of the neck is tender to touch with decreased range of motion of the neck LUNGS: Normal breath sounds bilaterally, no wheezing, rales,rhonchi or crepitation. No use of accessory muscles of respiration.  CARDIOVASCULAR: S1, S2 normal. No murmurs, rubs, or gallops.  ABDOMEN: Soft, nontender, nondistended. Bowel sounds present. No organomegaly or mass.  EXTREMITIES: No pedal edema, cyanosis, or clubbing.   NEUROLOGIC: Cranial nerves II through XII are intact. Muscle strength 5/5 in all extremities. Sensation intact. Gait not checked.  PSYCHIATRIC: The patient is alert and oriented x 3.  SKIN: No obvious rash, lesion, or ulcer.    LABORATORY PANEL:   CBC Recent Labs  Lab 12/31/17 0030  WBC 12.7*  HGB 9.0*  HCT 27.1*  PLT 283   ------------------------------------------------------------------------------------------------------------------  Chemistries  Recent Labs  Lab 12/28/17 0736  12/31/17 0030  NA 137   < > 132*  K 3.4*   < > 3.6  CL 101   < > 101  CO2 25   < > 25  GLUCOSE 114*   < > 131*  BUN 13   < > 8  CREATININE 0.70   < > 0.48  CALCIUM 8.7*   < > 8.1*  AST 15  --   --   ALT 8  --   --   ALKPHOS 74  --   --   BILITOT 0.7  --   --    < > = values in this interval not displayed.   ------------------------------------------------------------------------------------------------------------------  Cardiac Enzymes Recent Labs  Lab 12/28/17 0736  TROPONINI <0.03   ------------------------------------------------------------------------------------------------------------------  RADIOLOGY:  [place] Ekg Site Rite  Result Date: 12/31/2017 If Site Rite image not attached, placement could not be confirmed due to current cardiac rhythm.   EKG:   Orders placed or performed during the hospital encounter of 12/28/17  . EKG 12-Lead  . EKG 12-Lead  . ED EKG  . ED EKG    ASSESSMENT AND PLAN:   42 year old female with history of tobacco dependence and drug use who presents with neck pain and found to  have discitis and epidural abscess.  1.  Sepsis: Patient presents with tachycardia and leukocytosis.  Sepsis is from epidural abscess and discitis. Continue IV cefepime Discussed the case with infectious disease  2.  Epidural abscess/discitis: Change antibiotics to cefepime ID consultation done Patient evaluated by neurosurgery unable to drain or biopsy this  recommended antibiotics Also interventional radiology unable to biopsy or drain this lesion Patient's TEE will likely need to be done Monday    3.Tobacco dependence: Patient is encouraged to quit smoking. Counseling was provided for 4 minutes.  Provide nicotine patch  4.  History of IV drug use She reports no drug use for the past 18 to 24 months.      All the records are reviewed and case discussed with Care Management/Social Workerr. Management plans discussed with the patient, family and they are in agreement.  CODE STATUS: fc   TOTAL TIME TAKING CARE OF THIS PATIENT: .   POSSIBLE D/C to be determined with IV drug use patient will likely need prolonged hospitalization  Note: This dictation was prepared with Dragon dictation along with smaller phrase technology. Any transcriptional errors that result from this process are unintentional.   Auburn Bilberry M.D on 12/31/2017 at 3:06 PM  Between 7am to 6pm - Pager - [phone] After 6pm go to [email or web address] - password [email or web address] [facility]  [facility]  Office  [phone]  CC: Primary care physician; Alberteen Spindle, CNM

## 2017-12-31 NOTE — Progress Notes (Signed)
Peripherally Inserted Central Catheter/Midline Placement  The IV Nurse has discussed with the patient and/or persons authorized to consent for the patient, the purpose of this procedure and the potential benefits and risks involved with this procedure.  The benefits include less needle sticks, lab draws from the catheter, and the patient may be discharged home with the catheter. Risks include, but not limited to, infection, bleeding, blood clot (thrombus formation), and puncture of an artery; nerve damage and irregular heartbeat and possibility to perform a PICC exchange if needed/ordered by physician.  Alternatives to this procedure were also discussed.  Bard Power PICC patient education guide, fact sheet on infection prevention and patient information card has been provided to patient /or left at bedside.    PICC/Midline Placement Documentation  PICC Single Lumen 12/31/17 PICC Right Basilic 33 cm 0 cm (Active)  Indication for Insertion or Continuance of Line Prolonged intravenous therapies 12/31/2017  6:19 PM  Exposed Catheter (cm) 0 cm 12/31/2017  6:19 PM  Site Assessment Clean;Dry;Intact 12/31/2017  6:19 PM  Line Status Flushed;Saline locked;Blood return noted 12/31/2017  6:19 PM  Dressing Type Transparent 12/31/2017  6:19 PM  Dressing Status Clean;Dry;Intact;Antimicrobial disc in place 12/31/2017  6:19 PM  Dressing Change Due 01/07/18 12/31/2017  6:19 PM       Ethelda Chickurrie, Azariyah Luhrs Robert 12/31/2017, 6:20 PM

## 2017-12-31 NOTE — Progress Notes (Signed)
Pt presented for tee. No iv access. After attempts by staff to gain iv access, patient is refusing further attempts at IV access. Will need to cancel TEE until iv access is obtained and maintained.

## 2017-12-31 NOTE — Plan of Care (Signed)
TEE could not be done today. PICC placed. Pain managed with PRN pain medication. The patient has been stable today.  Problem: Education: Goal: Knowledge of General Education information will improve Description Including pain rating scale, medication(s)/side effects and non-pharmacologic comfort measures Outcome: Progressing   Problem: Health Behavior/Discharge Planning: Goal: Ability to manage health-related needs will improve Outcome: Progressing   Problem: Clinical Measurements: Goal: Ability to maintain clinical measurements within normal limits will improve Outcome: Progressing Goal: Will remain free from infection Outcome: Progressing Goal: Diagnostic test results will improve Outcome: Progressing Goal: Respiratory complications will improve Outcome: Progressing Goal: Cardiovascular complication will be avoided Outcome: Progressing   Problem: Activity: Goal: Risk for activity intolerance will decrease Outcome: Progressing   Problem: Nutrition: Goal: Adequate nutrition will be maintained Outcome: Progressing   Problem: Coping: Goal: Level of anxiety will decrease Outcome: Progressing   Problem: Elimination: Goal: Will not experience complications related to bowel motility Outcome: Progressing Goal: Will not experience complications related to urinary retention Outcome: Progressing   Problem: Pain Managment: Goal: General experience of comfort will improve Outcome: Progressing   Problem: Safety: Goal: Ability to remain free from injury will improve Outcome: Progressing   Problem: Skin Integrity: Goal: Risk for impaired skin integrity will decrease Outcome: Progressing

## 2017-12-31 NOTE — Progress Notes (Signed)
Pt. States Fentanyl IV did nothing for her pain and that her IV site "hurts real bad." No blood return to upper Left Forearm site. Pt. States "it's puffing up." Mild 1+ swelling noted. Attempts x 3 by 3 RN's to obtain new IV site: unsuccessful x 3. IV team consult made. Pt. States "only the IV team got me last time." MD made aware. TEE cancelled for now .

## 2017-12-31 NOTE — Progress Notes (Signed)
Andrea King is a 42 y.o. female with IV drug use, history of MRSA bacteremia  With septic emboli to lungs in 2017 for which she did not complete treatment ( left AMA) , hepatitis C presented to [facility] on 12/28/2017 with neck pain for the past week, pain radiating to her shoulders.  In the ED her temperature was 98.6, heart rate of 107, respiratory rate of 19, blood pressure 107/94.  WBC was 19,  She had an MRI of the cervical spine which showed discitis C5-C6 with some ventral fluid collection, started on Vanco and Zosyn .  This fluid collection is not amenable to drainage by interventional radiology. I am asked to see the patient for management of her infection. As per patient the current problem started a week ago. She thought it was neck crick and then it got worse and started  radiating to her left arm. She also felt sweaty and hot. She denies any cough, chest pain, sob or GI symptoms. In July  2017 patient had MRSA in the blood and was admitted to [facility] but left AMA and did not complete treatment. she then ended up in [facility] in October 2019 and had strep sanguis bacteremia with tricuspid endocarditis and  on 03/02/16  she went to the OR for tricuspid valve replacement but it was noted that the anterior leaflet was perforated and no vegetation present so the leaflet was repaired and a 30 mm ring annuloplasty performed as well as PFO closure.During that hospitalization she was on antibiotics and after surgery had daptomycin for 2 weeks.  Subjective feeling better Pain neck betetr  OBJECTIVE:BP 111/71 (BP Location: Right Arm)   Pulse 96   Temp 98.7 F (37.1 C) (Oral)   Resp 18   Ht 5\' 2"  (1.575 m)   Wt 49.7 kg   LMP 12/20/2017 (Approximate)   SpO2 99%   BMI 20.04 kg/m   Physical Exam  Constitutional: looks better, no distress, pale HENT:  Head: Normocephalic.  Eyes: Pupils are equal, round, and reactive to light.  Neck:    - did not check for neck rigidity  or movt  Cardiovascular:  s1s2 2/6 Systolic murmur  Sternal scar  Pulmonary/Chest:  B/l air entry Decreased bases   Abdominal: Soft. Bowel sounds are normal. She exhibits no distension.  Musculoskeletal: Normal range of motion.  Except neck   Neurological: She is alert and oriented to person, place, and time.  No focal weakness  Skin:  Multiple excoriations and scars legs     Lab Results CBC Latest Ref Rng & Units 12/31/2017 12/30/2017 12/29/2017  WBC 3.6 - 11.0 K/uL 12.7(H) 14.3(H) 14.7(H)  Hemoglobin 12.0 - 16.0 g/dL 9.0(L) 10.2(L) 9.9(L)  Hematocrit 35.0 - 47.0 % 27.1(L) 31.1(L) 28.6(L)  Platelets 150 - 440 K/uL 283 304 289   CMP Latest Ref Rng & Units 12/31/2017 12/30/2017 12/29/2017  Glucose 70 - 99 mg/dL 086(V) - 784(O)  BUN 6 - 20 mg/dL 8 - 10  Creatinine 9.62 - 1.00 mg/dL 9.52 8.41 3.24  Sodium 135 - 145 mmol/L 132(L) - 138  Potassium 3.5 - 5.1 mmol/L 3.6 - 3.8  Chloride 98 - 111 mmol/L 101 - 111  CO2 22 - 32 mmol/L 25 - 19(L)  Calcium 8.9 - 10.3 mg/dL 8.1(L) - 8.1(L)  Total Protein 6.5 - 8.1 g/dL - - -  Total Bilirubin 0.3 - 1.2 mg/dL - - -  Alkaline Phos 38 - 126 U/L - - -  AST 15 -  41 U/L - - -  ALT 0 - 44 U/L - - -      Microbiology:. 12/28/17 Serratia in blood culture 12/31/17 BC    Disc degeneration and spurring at C5-6. Findings of diskitis at C5-6 with endplate irregularity and mild bone marrow edema enhancement. There is ventral epidural fluid collection with surrounding epidural enhancement surrounding the cord. Given the clinical information appearance this is most consistent with discitis with epidural abscess. There is swelling of the cord with edema and mild spinal stenosis. Radiographs and labs were personally reviewed by me.   Assessment and Plan 42 y.o. female with IV drug use, history of MRSA bacteremia  With septic emboli to lungs in 2017 for which she did not complete treatment ( left AMA) , hepatitis C presented to [facility] on 12/28/2017 with neck pain for the past week, pain radiating to her shoulders.  In the ED her temperature was 98.6, heart rate of 107, respiratory rate of 19, blood pressure 107/94.  WBC was 19,  She had an MRI of the cervical spine which showed discitis C5-C6 with some ventral fluid collection, started on Vanco and Zosyn .  This fluid collection is not amenable to drainage by interventional radiology. I am asked to see the patient for management of her infection.   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 Has a history of IVDA even though she denies doing it in the last past 2 years.  Very likely the infection is related to the same practice.  Tox positive for cocaine. Not amenable for IR drainage.    Serratia bacteremia.  Serratia is an organism found in water and very likely this is due to IV drug use.  She has not been in any hospital recently. On  Cefepime. Will need for 6 weeks Will need to rule out endocarditis. 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 Did Not complete treatment.   Discussed the management with the patient  in detail. ID will follow her peripherally this weekend  Andrea ItoJAYASHREE Eve Rey, MD  12/31/17

## 2018-01-01 ENCOUNTER — Encounter: Payer: Self-pay | Admitting: Cardiology

## 2018-01-01 LAB — CULTURE, BLOOD (ROUTINE X 2)

## 2018-01-01 MED ORDER — FLUCONAZOLE 50 MG PO TABS
150.0000 mg | ORAL_TABLET | Freq: Once | ORAL | Status: AC
  Administered 2018-01-01: 150 mg via ORAL
  Filled 2018-01-01: qty 3

## 2018-01-01 MED ORDER — NYSTATIN 100000 UNIT/ML MT SUSP
5.0000 mL | Freq: Four times a day (QID) | OROMUCOSAL | Status: AC
  Administered 2018-01-01 – 2018-01-05 (×17): 500000 [IU] via OROMUCOSAL
  Filled 2018-01-01 (×19): qty 5

## 2018-01-01 NOTE — Progress Notes (Signed)
[facility] Physicians - [place] at [facility]   PATIENT NAME: Andrea King    MR#:  [id number]  DATE OF BIRTH:  1976/04/09  SUBJECTIVE:  CHIEF COMPLAINT:  Complains of oral thrush and vaginal candidiasis   REVIEW OF SYSTEMS:  CONSTITUTIONAL: No fever, fatigue or weakness.  EYES: No blurred or double vision.  EARS, NOSE, AND THROAT: No tinnitus or ear pain.  RESPIRATORY: No cough, shortness of breath, wheezing or hemoptysis.  CARDIOVASCULAR: No chest pain, orthopnea, edema.  GASTROINTESTINAL: No nausea, vomiting, diarrhea or abdominal pain.  GENITOURINARY: No dysuria, hematuria.  ENDOCRINE: No polyuria, nocturia,  HEMATOLOGY: No anemia, easy bruising or bleeding SKIN: No rash or lesion. MUSCULOSKELETAL: No joint pain or arthritis.  Reporting pain in the back of her neck NEUROLOGIC: No tingling, numbness, weakness.  PSYCHIATRY: No anxiety or depression.   DRUG ALLERGIES:  No Known Allergies  VITALS:  Blood pressure 113/60, pulse 93, temperature 98 F (36.7 C), temperature source Oral, resp. rate 18, height 5\' 2"  (1.575 m), weight 49.7 kg, last menstrual period 12/20/2017, SpO2 98 %.  PHYSICAL EXAMINATION:  GENERAL:  42 y.o.-year-old patient lying in the bed with no acute distress.  EYES: Pupils equal, round, reactive to light and accommodation. No scleral icterus. Extraocular muscles intact.  HEENT: Head atraumatic, normocephalic. Oropharynx and nasopharynx clear.  NECK:  Supple, no jugular venous distention. No thyroid enlargement, posterior cervical aspect of the neck is tender to touch with decreased range of motion of the neck LUNGS: Normal breath sounds bilaterally, no wheezing, rales,rhonchi or crepitation. No use of accessory muscles of respiration.  CARDIOVASCULAR: S1, S2 normal. No murmurs, rubs, or gallops.  ABDOMEN: Soft, nontender, nondistended. Bowel sounds present. No organomegaly or mass.  EXTREMITIES: No pedal edema, cyanosis, or clubbing.   NEUROLOGIC: Cranial nerves II through XII are intact. Muscle strength 5/5 in all extremities. Sensation intact. Gait not checked.  PSYCHIATRIC: The patient is alert and oriented x 3.  SKIN: No obvious rash, lesion, or ulcer.    LABORATORY PANEL:   CBC Recent Labs  Lab 12/31/17 0030  WBC 12.7*  HGB 9.0*  HCT 27.1*  PLT 283   ------------------------------------------------------------------------------------------------------------------  Chemistries  Recent Labs  Lab 12/28/17 0736  12/31/17 0030  NA 137   < > 132*  K 3.4*   < > 3.6  CL 101   < > 101  CO2 25   < > 25  GLUCOSE 114*   < > 131*  BUN 13   < > 8  CREATININE 0.70   < > 0.48  CALCIUM 8.7*   < > 8.1*  AST 15  --   --   ALT 8  --   --   ALKPHOS 74  --   --   BILITOT 0.7  --   --    < > = values in this interval not displayed.   ------------------------------------------------------------------------------------------------------------------  Cardiac Enzymes Recent Labs  Lab 12/28/17 0736  TROPONINI <0.03   ------------------------------------------------------------------------------------------------------------------  RADIOLOGY:  [place] Ekg Site Rite  Result Date: 12/31/2017 If Site Rite image not attached, placement could not be confirmed due to current cardiac rhythm.   EKG:   Orders placed or performed during the hospital encounter of 12/28/17  . EKG 12-Lead  . EKG 12-Lead  . ED EKG  . ED EKG    ASSESSMENT AND PLAN:   42 year old female with history of tobacco dependence and drug use who presents with neck pain and found to have discitis and  epidural abscess.  1.  Sepsis: Patient presents with tachycardia and leukocytosis.  Sepsis is from epidural abscess and discitis. Continue IV cefepime Discussed the case with infectious disease Plan for TEE on Monday or Tuesday depending on staffing in the Cath Lab  2.  Epidural abscess/discitis: Change antibiotics to cefepime ID consultation  done Patient evaluated by neurosurgery unable to drain or biopsy this recommended antibiotics Also interventional radiology unable to biopsy or drain this lesion    3.Tobacco dependence: Patient is encouraged to quit smoking. Counseling was provided for 4 minutes.   4.  History of IV drug use She reports no drug use for the past 18 to 24 months.  5.  Oral thrush and vaginal candidiasis 1 dose of nystatin as well as nystatin swish and swallow     All the records are reviewed and case discussed with Care Management/Social Workerr. Management plans discussed with the patient, family and they are in agreement.  CODE STATUS: fc   TOTAL TIME TAKING CARE OF THIS PATIENT: 35minutes.   POSSIBLE D/C to be determined with IV drug use patient will likely need prolonged hospitalization  Note: This dictation was prepared with Dragon dictation along with smaller phrase technology. Any transcriptional errors that result from this process are unintentional.   Auburn BilberryShreyang Sharone Picchi M.D on 01/01/2018 at 11:49 AM  Between 7am to 6pm - Pager - [phone] After 6pm go to [email or web address] - password [email or web address] [facility]  [place] [place] [facility]  Office  [phone]  CC: Primary care physician; Alberteen SpindleSciora, [name], CNM

## 2018-01-02 LAB — CBC
HCT: 30.2 % — ABNORMAL LOW (ref 35.0–47.0)
Hemoglobin: 10.3 g/dL — ABNORMAL LOW (ref 12.0–16.0)
MCH: 27.4 pg (ref 26.0–34.0)
MCHC: 34 g/dL (ref 32.0–36.0)
MCV: 80.6 fL (ref 80.0–100.0)
PLATELETS: 425 10*3/uL (ref 150–440)
RBC: 3.74 MIL/uL — ABNORMAL LOW (ref 3.80–5.20)
RDW: 15.5 % — AB (ref 11.5–14.5)
WBC: 16.4 10*3/uL — ABNORMAL HIGH (ref 3.6–11.0)

## 2018-01-02 NOTE — Progress Notes (Signed)
TEE will be scheduled for September 3.  Procedure not available tomorrow.

## 2018-01-02 NOTE — Progress Notes (Signed)
[facility] Physicians - [place] at [facility]   PATIENT NAME: Andrea King    MR#:  [id number]  DATE OF BIRTH:  01-Oct-1975  SUBJECTIVE:  CHIEF COMPLAINT:  Patient denying any complaints currently  REVIEW OF SYSTEMS:  CONSTITUTIONAL: No fever, fatigue or weakness.  EYES: No blurred or double vision.  EARS, NOSE, AND THROAT: No tinnitus or ear pain.  RESPIRATORY: No cough, shortness of breath, wheezing or hemoptysis.  CARDIOVASCULAR: No chest pain, orthopnea, edema.  GASTROINTESTINAL: No nausea, vomiting, diarrhea or abdominal pain.  GENITOURINARY: No dysuria, hematuria.  ENDOCRINE: No polyuria, nocturia,  HEMATOLOGY: No anemia, easy bruising or bleeding SKIN: No rash or lesion. MUSCULOSKELETAL: No joint pain or arthritis.  Reporting pain in the back of her neck NEUROLOGIC: No tingling, numbness, weakness.  PSYCHIATRY: No anxiety or depression.   DRUG ALLERGIES:  No Known Allergies  VITALS:  Blood pressure 105/70, pulse 79, temperature 98.2 F (36.8 C), temperature source Oral, resp. rate 20, height 5\' 2"  (1.575 m), weight 49.7 kg, last menstrual period 12/20/2017, SpO2 99 %.  PHYSICAL EXAMINATION:  GENERAL:  42 y.o.-year-old patient lying in the bed with no acute distress.  EYES: Pupils equal, round, reactive to light and accommodation. No scleral icterus. Extraocular muscles intact.  HEENT: Head atraumatic, normocephalic. Oropharynx and nasopharynx clear.  NECK:  Supple, no jugular venous distention. No thyroid enlargement, posterior cervical aspect of the neck is tender to touch with decreased range of motion of the neck LUNGS: Normal breath sounds bilaterally, no wheezing, rales,rhonchi or crepitation. No use of accessory muscles of respiration.  CARDIOVASCULAR: S1, S2 normal. No murmurs, rubs, or gallops.  ABDOMEN: Soft, nontender, nondistended. Bowel sounds present. No organomegaly or mass.  EXTREMITIES: No pedal edema, cyanosis, or clubbing.   NEUROLOGIC: Cranial nerves II through XII are intact. Muscle strength 5/5 in all extremities. Sensation intact. Gait not checked.  PSYCHIATRIC: The patient is alert and oriented x 3.  SKIN: No obvious rash, lesion, or ulcer.    LABORATORY PANEL:   CBC Recent Labs  Lab 01/02/18 0525  WBC 16.4*  HGB 10.3*  HCT 30.2*  PLT 425   ------------------------------------------------------------------------------------------------------------------  Chemistries  Recent Labs  Lab 12/28/17 0736  12/31/17 0030  NA 137   < > 132*  K 3.4*   < > 3.6  CL 101   < > 101  CO2 25   < > 25  GLUCOSE 114*   < > 131*  BUN 13   < > 8  CREATININE 0.70   < > 0.48  CALCIUM 8.7*   < > 8.1*  AST 15  --   --   ALT 8  --   --   ALKPHOS 74  --   --   BILITOT 0.7  --   --    < > = values in this interval not displayed.   ------------------------------------------------------------------------------------------------------------------  Cardiac Enzymes Recent Labs  Lab 12/28/17 0736  TROPONINI <0.03   ------------------------------------------------------------------------------------------------------------------  RADIOLOGY:  [place] Ekg Site Rite  Result Date: 12/31/2017 If Site Rite image not attached, placement could not be confirmed due to current cardiac rhythm.   EKG:   Orders placed or performed during the hospital encounter of 12/28/17  . EKG 12-Lead  . EKG 12-Lead  . ED EKG  . ED EKG    ASSESSMENT AND PLAN:   42 year old female with history of tobacco dependence and drug use who presents with neck pain and found to have discitis and epidural abscess.  1.  Sepsis: Patient presents with tachycardia and leukocytosis.  Sepsis is from epidural abscess and discitis. Continue IV cefepime Discussed the case with infectious disease Plan for TEE on Tuesday Blood cultures positive for Serratia Follow WBC   2.  Epidural abscess/discitis: Change antibiotics to cefepime ID  consultation done Patient evaluated by neurosurgery unable to drain or biopsy, recommended antibiotics Also interventional radiology unable to biopsy or drain this lesion   3.Tobacco dependence: Patient is encouraged to quit smoking. Counseling was provided for 4 minutes.   4.  History of IV drug use She reports no drug use for the past 18 to 24 months.  5.  Oral thrush tinea nystatin swish and swallow     All the records are reviewed and case discussed with Care Management/Social Workerr. Management plans discussed with the patient, family and they are in agreement.  CODE STATUS: fc   TOTAL TIME TAKING CARE OF THIS PATIENT: .   POSSIBLE D/C to be determined with IV drug use patient will likely need prolonged hospitalization  Note: This dictation was prepared with Dragon dictation along with smaller phrase technology. Any transcriptional errors that result from this process are unintentional.   Auburn Bilberry M.D on 01/02/2018 at 11:53 AM  Between 7am to 6pm - Pager - [phone] After 6pm go to [email or web address] - password [email or web address] [facility]  [place] [facility]  Office  [phone]  CC: Primary care physician; Alberteen Spindle, CNM

## 2018-01-03 NOTE — Progress Notes (Signed)
[facility] Physicians - [place] at [facility]   PATIENT NAME: Andrea King    MR#:  [id number]  DATE OF BIRTH:  11/12/1975  SUBJECTIVE:  CHIEF COMPLAINT:  Pain under control currently comfortable  REVIEW OF SYSTEMS:  CONSTITUTIONAL: No fever, fatigue or weakness.  EYES: No blurred or double vision.  EARS, NOSE, AND THROAT: No tinnitus or ear pain.  RESPIRATORY: No cough, shortness of breath, wheezing or hemoptysis.  CARDIOVASCULAR: No chest pain, orthopnea, edema.  GASTROINTESTINAL: No nausea, vomiting, diarrhea or abdominal pain.  GENITOURINARY: No dysuria, hematuria.  ENDOCRINE: No polyuria, nocturia,  HEMATOLOGY: No anemia, easy bruising or bleeding SKIN: No rash or lesion. MUSCULOSKELETAL: No joint pain or arthritis.  Reporting pain in the back of her neck NEUROLOGIC: No tingling, numbness, weakness.  PSYCHIATRY: No anxiety or depression.   DRUG ALLERGIES:  No Known Allergies  VITALS:  Blood pressure (!) 101/58, pulse 89, temperature 98.8 F (37.1 C), temperature source Oral, resp. rate 16, height 5\' 2"  (1.575 m), weight 49.7 kg, last menstrual period 12/20/2017, SpO2 100 %.  PHYSICAL EXAMINATION:  GENERAL:  42 y.o.-year-old patient lying in the bed with no acute distress.  EYES: Pupils equal, round, reactive to light and accommodation. No scleral icterus. Extraocular muscles intact.  HEENT: Head atraumatic, normocephalic. Oropharynx and nasopharynx clear.  NECK:  Supple, no jugular venous distention. No thyroid enlargement, posterior cervical aspect of the neck is tender to touch with decreased range of motion of the neck LUNGS: Normal breath sounds bilaterally, no wheezing, rales,rhonchi or crepitation. No use of accessory muscles of respiration.  CARDIOVASCULAR: S1, S2 normal. No murmurs, rubs, or gallops.  ABDOMEN: Soft, nontender, nondistended. Bowel sounds present. No organomegaly or mass.  EXTREMITIES: No pedal edema, cyanosis, or clubbing.   NEUROLOGIC: Cranial nerves II through XII are intact. Muscle strength 5/5 in all extremities. Sensation intact. Gait not checked.  PSYCHIATRIC: The patient is alert and oriented x 3.  SKIN: No obvious rash, lesion, or ulcer.    LABORATORY PANEL:   CBC Recent Labs  Lab 01/02/18 0525  WBC 16.4*  HGB 10.3*  HCT 30.2*  PLT 425   ------------------------------------------------------------------------------------------------------------------  Chemistries  Recent Labs  Lab 12/28/17 0736  12/31/17 0030  NA 137   < > 132*  K 3.4*   < > 3.6  CL 101   < > 101  CO2 25   < > 25  GLUCOSE 114*   < > 131*  BUN 13   < > 8  CREATININE 0.70   < > 0.48  CALCIUM 8.7*   < > 8.1*  AST 15  --   --   ALT 8  --   --   ALKPHOS 74  --   --   BILITOT 0.7  --   --    < > = values in this interval not displayed.   ------------------------------------------------------------------------------------------------------------------  Cardiac Enzymes Recent Labs  Lab 12/28/17 0736  TROPONINI <0.03   ------------------------------------------------------------------------------------------------------------------  RADIOLOGY:  No results found.  EKG:   Orders placed or performed during the hospital encounter of 12/28/17  . EKG 12-Lead  . EKG 12-Lead  . ED EKG  . ED EKG    ASSESSMENT AND PLAN:   42 year old female with history of tobacco dependence and drug use who presents with neck pain and found to have discitis and epidural abscess.  1.  Sepsis: Patient presents with tachycardia and leukocytosis.  Sepsis is from epidural abscess and discitis. Continue IV cefepime Discussed  the case with infectious disease According to cardiology TEE tomorrow Blood cultures positive for Serratia WBC count now normal   2.  Epidural abscess/discitis: Continue cefepime ID consultation done Patient evaluated by neurosurgery unable to drain or biopsy, recommended antibiotics Also interventional  radiology unable to biopsy or drain this lesion   3.Tobacco dependence: Patient is encouraged to quit smoking. Counseling was provided for 4 minutes.   4.  History of IV drug use She reports no drug use for the past 18 to 24 months.  5.  Oral thrush tinea nystatin swish and swallow     All the records are reviewed and case discussed with Care Management/Social Workerr. Management plans discussed with the patient, family and they are in agreement.  CODE STATUS: fc   TOTAL TIME TAKING CARE OF THIS PATIENT: .   POSSIBLE D/C to be determined with IV drug use patient will likely need prolonged hospitalization  Note: This dictation was prepared with Dragon dictation along with smaller phrase technology. Any transcriptional errors that result from this process are unintentional.   Auburn Bilberry M.D on 01/03/2018 at 12:47 PM  Between 7am to 6pm - Pager - [phone] After 6pm go to [email or web address] - password [email or web address] [facility]  [place] [facility]  Office  [phone]  CC: Primary care physician; Alberteen Spindle, CNM

## 2018-01-04 ENCOUNTER — Encounter: Payer: Self-pay | Admitting: *Deleted

## 2018-01-04 ENCOUNTER — Inpatient Hospital Stay: Payer: Self-pay

## 2018-01-04 ENCOUNTER — Inpatient Hospital Stay
Admit: 2018-01-04 | Discharge: 2018-01-04 | Disposition: A | Discharge status: Home / Self Care | Payer: Self-pay | Attending: Cardiology | Admitting: Cardiology

## 2018-01-04 ENCOUNTER — Encounter
Admission: EM | Discharge status: Against Medical Advice | Payer: Self-pay | Source: Home / Self Care | Attending: Internal Medicine

## 2018-01-04 HISTORY — PX: TEE WITHOUT CARDIOVERSION: SHX5443

## 2018-01-04 LAB — BASIC METABOLIC PANEL
Anion gap: 7 (ref 5–15)
BUN: 15 mg/dL (ref 6–20)
CHLORIDE: 101 mmol/L (ref 98–111)
CO2: 30 mmol/L (ref 22–32)
Calcium: 9.5 mg/dL (ref 8.9–10.3)
Creatinine, Ser: 0.55 mg/dL (ref 0.44–1.00)
GFR calc Af Amer: >60 mL/min (ref >60)
GFR calc non Af Amer: >60 mL/min (ref >60)
Glucose, Bld: 110 mg/dL — ABNORMAL HIGH (ref 70–99)
POTASSIUM: 4.6 mmol/L (ref 3.5–5.1)
SODIUM: 138 mmol/L (ref 135–145)

## 2018-01-04 LAB — CBC
HEMATOCRIT: 31.6 % — AB (ref 35.0–47.0)
HEMOGLOBIN: 10.6 g/dL — AB (ref 12.0–16.0)
MCH: 27 pg (ref 26.0–34.0)
MCHC: 33.6 g/dL (ref 32.0–36.0)
MCV: 80.3 fL (ref 80.0–100.0)
Platelets: 593 10*3/uL — ABNORMAL HIGH (ref 150–440)
RBC: 3.94 MIL/uL (ref 3.80–5.20)
RDW: 15.4 % — ABNORMAL HIGH (ref 11.5–14.5)
WBC: 19.9 10*3/uL — ABNORMAL HIGH (ref 3.6–11.0)

## 2018-01-04 SURGERY — ECHOCARDIOGRAM, TRANSESOPHAGEAL
Anesthesia: General

## 2018-01-04 MED ORDER — LIDOCAINE VISCOUS HCL 2 % MT SOLN
OROMUCOSAL | Status: AC
  Administered 2018-01-04: 15 mL via OROMUCOSAL
  Filled 2018-01-04: qty 15

## 2018-01-04 MED ORDER — PROPOFOL 10 MG/ML IV BOLUS
INTRAVENOUS | Status: AC
  Filled 2018-01-04: qty 20

## 2018-01-04 MED ORDER — HYDROMORPHONE HCL 1 MG/ML IJ SOLN
2.0000 mg | Freq: Four times a day (QID) | INTRAMUSCULAR | Status: DC | PRN
  Administered 2018-01-04 – 2018-01-05 (×4): 2 mg via INTRAVENOUS
  Filled 2018-01-04 (×5): qty 2

## 2018-01-04 MED ORDER — SODIUM CHLORIDE 0.9 % IV SOLN
INTRAVENOUS | Status: DC
  Administered 2018-01-04: 12:00:00 via INTRAVENOUS

## 2018-01-04 MED ORDER — LIDOCAINE VISCOUS HCL 2 % MT SOLN
15.0000 mL | Freq: Once | OROMUCOSAL | Status: AC
  Administered 2018-01-04: 15 mL via OROMUCOSAL
  Filled 2018-01-04: qty 15

## 2018-01-04 MED ORDER — MIDAZOLAM HCL 5 MG/5ML IJ SOLN
INTRAMUSCULAR | Status: AC
  Filled 2018-01-04: qty 10

## 2018-01-04 MED ORDER — MIDAZOLAM HCL 5 MG/5ML IJ SOLN
INTRAMUSCULAR | Status: AC | PRN
  Administered 2018-01-04: 3 mg via INTRAVENOUS
  Administered 2018-01-04: 1 mg via INTRAVENOUS

## 2018-01-04 MED ORDER — BUTAMBEN-TETRACAINE-BENZOCAINE 2-2-14 % EX AERO
INHALATION_SPRAY | CUTANEOUS | Status: AC
  Administered 2018-01-04: 1 via TOPICAL
  Filled 2018-01-04: qty 5

## 2018-01-04 MED ORDER — MIDAZOLAM HCL 2 MG/2ML IJ SOLN
INTRAMUSCULAR | Status: AC | PRN
  Administered 2018-01-04: 1 mg via INTRAVENOUS

## 2018-01-04 MED ORDER — PROPOFOL 10 MG/ML IV BOLUS
INTRAVENOUS | Status: DC | PRN
  Administered 2018-01-04 (×3): 20 mg via INTRAVENOUS
  Administered 2018-01-04: 50 mg via INTRAVENOUS

## 2018-01-04 MED ORDER — FENTANYL CITRATE (PF) 100 MCG/2ML IJ SOLN
INTRAMUSCULAR | Status: AC
  Filled 2018-01-04: qty 4

## 2018-01-04 MED ORDER — BUTAMBEN-TETRACAINE-BENZOCAINE 2-2-14 % EX AERO
1.0000 | INHALATION_SPRAY | Freq: Once | CUTANEOUS | Status: AC
  Administered 2018-01-04: 1 via TOPICAL
  Filled 2018-01-04: qty 20

## 2018-01-04 MED ORDER — FENTANYL CITRATE (PF) 100 MCG/2ML IJ SOLN
INTRAMUSCULAR | Status: AC | PRN
  Administered 2018-01-04: 75 ug via INTRAVENOUS
  Administered 2018-01-04 (×2): 25 ug via INTRAVENOUS

## 2018-01-04 NOTE — Progress Notes (Signed)
*  PRELIMINARY RESULTS* Echocardiogram Echocardiogram Transesophageal has been performed.  Joanette Gula Lasheka Kempner 01/04/2018, 1:54 PM

## 2018-01-04 NOTE — Progress Notes (Signed)
Patient had no noticeable sedation with high-dose fentanyl and Versed.  Will consult anesthesiology for assistance with sedation with monitored sedation.

## 2018-01-04 NOTE — Anesthesia Post-op Follow-up Note (Signed)
Anesthesia QCDR form completed.        

## 2018-01-04 NOTE — Transfer of Care (Signed)
Immediate Anesthesia Transfer of Care Note  Patient: Andrea King  Procedure(s) Performed: TRANSESOPHAGEAL ECHOCARDIOGRAM (TEE) (N/A )  Patient Location: PACU and Short Stay  Anesthesia Type:MAC  Level of Consciousness: drowsy  Airway & Oxygen Therapy: Patient Spontanous Breathing  Post-op Assessment: Report given to RN  Post vital signs: stable  Last Vitals:  Vitals Value Taken Time  BP    Temp    Pulse    Resp    SpO2      Last Pain:  Vitals:   01/04/18 1230  TempSrc:   PainSc: 10-Worst pain ever      Patients Stated Pain Goal: 5 (43/60/67 7034)  Complications: No apparent anesthesia complications

## 2018-01-04 NOTE — Progress Notes (Signed)
[facility] Physicians - [place] at [facility]   PATIENT NAME: Andrea King    MR#:  [id number]  DATE OF BIRTH:  12/09/1975  SUBJECTIVE:   Pain under control currently comfortable Hungry, awaiting TEE today  REVIEW OF SYSTEMS:  CONSTITUTIONAL: No fever, fatigue or weakness.  EYES: No blurred or double vision.  EARS, NOSE, AND THROAT: No tinnitus or ear pain.  RESPIRATORY: No cough, shortness of breath, wheezing or hemoptysis.  CARDIOVASCULAR: No chest pain, orthopnea, edema.  GASTROINTESTINAL: No nausea, vomiting, diarrhea or abdominal pain.  GENITOURINARY: No dysuria, hematuria.  ENDOCRINE: No polyuria, nocturia,  HEMATOLOGY: No anemia, easy bruising or bleeding SKIN: No rash or lesion. MUSCULOSKELETAL: No joint pain or arthritis.  Reporting pain in the back of her neck NEUROLOGIC: No tingling, numbness, weakness.  PSYCHIATRY: No anxiety or depression.   DRUG ALLERGIES:  No Known Allergies  VITALS:  Blood pressure 112/66, pulse 95, temperature 98.7 F (37.1 C), temperature source Oral, resp. rate 20, height 5\' 2"  (1.575 m), weight 52 kg, last menstrual period 12/20/2017, SpO2 98 %.  PHYSICAL EXAMINATION:  GENERAL:  42 y.o.-year-old patient lying in the bed with no acute distress.  EYES: Pupils equal, round, reactive to light and accommodation. No scleral icterus. Extraocular muscles intact.  HEENT: Head atraumatic, normocephalic. Oropharynx and nasopharynx clear.  NECK:  Supple, no jugular venous distention. No thyroid enlargement, posterior cervical aspect of the neck is tender to touch with decreased range of motion of the neck LUNGS: Normal breath sounds bilaterally, no wheezing, rales,rhonchi or crepitation. No use of accessory muscles of respiration.  CARDIOVASCULAR: S1, S2 normal. No murmurs, rubs, or gallops.  ABDOMEN: Soft, nontender, nondistended. Bowel sounds present. No organomegaly or mass.  EXTREMITIES: No pedal edema, cyanosis, or clubbing.   NEUROLOGIC: Cranial nerves II through XII are intact. Muscle strength 5/5 in all extremities. Sensation intact. Gait not checked.  PSYCHIATRIC: The patient is alert and oriented x 3.  SKIN: No obvious rash, lesion, or ulcer.    LABORATORY PANEL:   CBC Recent Labs  Lab 01/04/18 0535  WBC 19.9*  HGB 10.6*  HCT 31.6*  PLT 593*   ------------------------------------------------------------------------------------------------------------------  Chemistries  Recent Labs  Lab 01/04/18 0535  NA 138  K 4.6  CL 101  CO2 30  GLUCOSE 110*  BUN 15  CREATININE 0.55  CALCIUM 9.5   ------------------------------------------------------------------------------------------------------------------  ASSESSMENT AND PLAN:   42 year old female with history of tobacco dependence and drug use who presents with neck pain and found to have discitis and epidural abscess.  1.Sepsis: Patient presents with tachycardia and leukocytosis.  Sepsis is from epidural abscess and discitis. Continue IV cefepime for 6 weeks.  PICC line placed on August 31st, 2019 Discussed the case with infectious disease TEE today Blood cultures positive for Serratia Marecens WBC count now 14.3--12.7--16.4--19K  2.  Acute Epidural abscess/discitis: Continue cefepime ID consultation done Patient evaluated by neurosurgery and IR unable to drain or biopsy, recommended antibiotics   3.Tobacco dependence: Patient is encouraged to quit smoking. Counseling was provided for 4 minutes.  4.  History of IV drug use She reports no drug use for the past 18 to 24 months.  5.  Oral thrush tinea nystatin swish and swallow  All the records are reviewed and case discussed with Care Management/Social Workerr. Management plans discussed with the patient, family and they are in agreement.  CODE STATUS: full code  TOTAL TIME TAKING CARE OF THIS PATIENT: .   POSSIBLE D/C to be determined  with IV drug use patient will  likely need prolonged hospitalization atleast 6 weeks till she finishes her IV abx course  Note: This dictation was prepared with Dragon dictation along with smaller phrase technology. Any transcriptional errors that result from this process are unintentional.   [name] M.D on 01/04/2018 at 9:47 AM  Between 7am to 6pm - Pager - [phone] After 6pm go to [email or web address] - password [email or web address] [facility]  [place] [facility]  Office  [phone]  CC: Primary care physician; Alberteen Spindle, CNM

## 2018-01-04 NOTE — Sedation Documentation (Signed)
1215-Pt remains awake and not sedated after 5 mg versed and 125 mcg fentanyl. Anesthesia team notified and en route. Will monitor Pt with Md at bedside until then.

## 2018-01-04 NOTE — Anesthesia Preprocedure Evaluation (Signed)
Anesthesia Evaluation  Patient identified by MRN, date of birth, ID band Patient awake    Reviewed: Allergy & Precautions, H&P , NPO status , Patient's Chart, lab work & pertinent test results  Airway Mallampati: III  TM Distance: >3 FB Neck ROM: full    Dental  (+) Poor Dentition, Chipped, Missing   Pulmonary neg pulmonary ROS, asthma , Current Smoker,    breath sounds clear to auscultation       Cardiovascular negative cardio ROS   Rhythm:regular Rate:Normal     Neuro/Psych PSYCHIATRIC DISORDERS negative neurological ROS  negative psych ROS   GI/Hepatic negative GI ROS, Neg liver ROS, (+) Hepatitis -  Endo/Other  negative endocrine ROS  Renal/GU negative Renal ROS  negative genitourinary   Musculoskeletal   Abdominal   Peds  Hematology negative hematology ROS (+) anemia ,   Anesthesia Other Findings Past Medical History: No date: Anemia No date: Asthma No date: DJD (degenerative joint disease) No date: Hepatitis C No date: Substance abuse [facility])  Past Surgical History: 04/28/2013: INGUINAL HERNIA REPAIR; Left     Comment:  Procedure: HERNIA REPAIR INGUINAL ADULT with mesh;                Surgeon: Cherylynn Ridges, MD;  Location[facility] OR;  Service:               General;  Laterality: Left; 11/18/2015: TEE WITHOUT CARDIOVERSION; N/A     Comment:  Procedure: TRANSESOPHAGEAL ECHOCARDIOGRAM (TEE);                Surgeon: Iran Ouch, MD;  Location: [facility] ORS;                Service: Cardiovascular;  Laterality: N/A; 12/31/2017: TEE WITHOUT CARDIOVERSION; N/A     Comment:  Procedure: TRANSESOPHAGEAL ECHOCARDIOGRAM (TEE);                Surgeon: Dalia Heading, MD;  Location: [facility] ORS;                Service: Cardiovascular;  Laterality: N/A; No date: TUMOR EXCISION     Comment:  left scalpula  BMI    Body Mass Index:  20.97 kg/m      Reproductive/Obstetrics negative OB ROS                             Anesthesia Physical Anesthesia Plan  ASA: III  Anesthesia Plan: General   Post-op Pain Management:    Induction:   PONV Risk Score and Plan: Propofol infusion and TIVA  Airway Management Planned:   Additional Equipment:   Intra-op Plan:   Post-operative Plan:   Informed Consent: I have reviewed the patients History and Physical, chart, labs and discussed the procedure including the risks, benefits and alternatives for the proposed anesthesia with the patient or authorized representative who has indicated his/her understanding and acceptance.   Dental Advisory Given  Plan Discussed with: Anesthesiologist, CRNA and Surgeon  Anesthesia Plan Comments:         Anesthesia Quick Evaluation

## 2018-01-04 NOTE — Anesthesia Postprocedure Evaluation (Signed)
Anesthesia Post Note  Patient: Andrea King  Procedure(s) Performed: TRANSESOPHAGEAL ECHOCARDIOGRAM (TEE) (N/A )  Patient location during evaluation: PACU Anesthesia Type: General Level of consciousness: awake and alert Pain management: pain level controlled Vital Signs Assessment: post-procedure vital signs reviewed and stable Respiratory status: spontaneous breathing, nonlabored ventilation and respiratory function stable Cardiovascular status: blood pressure returned to baseline and stable Postop Assessment: no apparent nausea or vomiting Anesthetic complications: no     Last Vitals:  Vitals:   01/04/18 1420 01/04/18 1432  BP:  (!) 117/58  Pulse: 100 93  Resp: 12 18  Temp:  (!) 36.4 C  SpO2: 100% 100%    Last Pain:  Vitals:   01/04/18 1432  TempSrc: Oral  PainSc:                  Jovita Gamma

## 2018-01-04 NOTE — Procedures (Signed)
   TRANSESOPHAGEAL ECHOCARDIOGRAM   NAME:  Andrea King   MRN: [id number] DOB:  December 14, 1975   ADMIT DATE: 12/28/2017  INDICATIONS:   PROCEDURE:   Informed consent was obtained prior to the procedure. The risks, benefits and alternatives for the procedure were discussed and the patient comprehended these risks.  Risks include, but are not limited to, cough, sore throat, vomiting, nausea, somnolence, esophageal and stomach trauma or perforation, bleeding, low blood pressure, aspiration, pneumonia, infection, trauma to the teeth and death.    After a procedural time-out, the patient was given 5 mg versed and 125 mcg fentanyl with no sedation.  The oropharynx was anesthetized 3 cc of topical 1% viscous lidocaine. Moderate sedation achieved with propofol per dept of anesthesia.  The transesophageal probe was inserted in the esophagus and stomach without difficulty and multiple views were obtained. I was present for the entire procedure.    COMPLICATIONS:    There were no immediate complications.  FINDINGS:  LEFT VENTRICLE: EF = 55%. No regional wall motion abnormalities.  RIGHT VENTRICLE: Normal size and function.   LEFT ATRIUM: Normal atrial size. No masses or thrombi.   LEFT ATRIAL APPENDAGE: No thrombus.   RIGHT ATRIUM: Normal  AORTIC VALVE:  Trileaflet. No vegetations  MITRAL VALVE:    Normal. No vegetations. Trivial mr.   TRICUSPID VALVE: Normal. No vegetations. Trivial TR  PULMONIC VALVE: Grossly normal.  INTERATRIAL SEPTUM: No PFO or ASD.   PERICARDIUM: No effusion     CONCLUSION: No vegetations or thrombi. Tricuspid valve looks good.

## 2018-01-05 ENCOUNTER — Encounter: Payer: Self-pay | Admitting: Cardiology

## 2018-01-05 LAB — CULTURE, BLOOD (ROUTINE X 2)
Culture: NO GROWTH
Special Requests: ADEQUATE

## 2018-01-05 MED ORDER — CHLORHEXIDINE GLUCONATE CLOTH 2 % EX PADS
6.0000 | MEDICATED_PAD | Freq: Every day | CUTANEOUS | Status: DC
  Administered 2018-01-05 – 2018-01-09 (×4): 6 via TOPICAL

## 2018-01-05 MED ORDER — MUPIROCIN 2 % EX OINT
1.0000 "application " | TOPICAL_OINTMENT | Freq: Two times a day (BID) | CUTANEOUS | Status: DC
  Administered 2018-01-05 (×2): 1 via NASAL
  Filled 2018-01-05: qty 22

## 2018-01-05 MED ORDER — HYDROCODONE-ACETAMINOPHEN 5-325 MG PO TABS
1.0000 | ORAL_TABLET | ORAL | Status: DC | PRN
  Administered 2018-01-05 – 2018-01-09 (×8): 1 via ORAL
  Filled 2018-01-05 (×8): qty 1

## 2018-01-05 MED ORDER — HYDROMORPHONE HCL 1 MG/ML IJ SOLN: 2.0000 mg | INTRAMUSCULAR | Status: DC | PRN

## 2018-01-05 MED ORDER — HYDROMORPHONE HCL 1 MG/ML IJ SOLN
2.0000 mg | Freq: Four times a day (QID) | INTRAMUSCULAR | Status: DC | PRN
  Administered 2018-01-05 – 2018-01-08 (×11): 2 mg via INTRAVENOUS
  Filled 2018-01-05 (×12): qty 2

## 2018-01-05 NOTE — Progress Notes (Signed)
Nutrition Follow Up Note  DOCUMENTATION CODES:   Non-severe (moderate) malnutrition in context of social or environmental circumstances  INTERVENTION:   - Provide Ensure Enlive po TID, each supplement provides 350 kcal and 20 grams of protein (vanilla flavor)  - MVI with minerals daily  NUTRITION DIAGNOSIS:   Moderate Malnutrition related to social/environmental circumstances (polysubstance abuse) as evidenced by mild to moderate fat depletions, mild to moderate muscle depletion.  GOAL:   Patient will meet greater than or equal to 90% of their needs  -progressing   MONITOR:   PO intake, Supplement acceptance, Weight trends, Labs  ASSESSMENT:   42 year old female who presented to the ED with neck pain. MRI of the spine showed discitis and epidural abscess. 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   Pt with improved appetite and oral intake; eating 50-100% of meals and drinking Ensure. Per chart pt is weight stable since admit. Recommend continue supplements and MVI.   Medications reviewed and include: lovenox, MVI, nicotine, oxycodone, cefepime  Labs reviewed: wbc- 19.9(H), Hgb 10.6(L), Hct 31.6(L)  Diet Order            Diet regular Room service appropriate? Yes; Fluid consistency: Thin  Diet effective now             EDUCATION NEEDS:   No education needs have been identified at this time  Skin:  Skin Assessment: Reviewed RN Assessment (scattered abrasions)  Last BM:  9/3  Height:   Ht Readings from Last 1 Encounters:  01/04/18 5\' 2"  (1.575 m)    Weight:   Wt Readings from Last 1 Encounters:  01/04/18 52 kg    Ideal Body Weight:  50 kg  BMI:  Body mass index is 20.97 kg/m.  Estimated Nutritional Needs:   Kcal:  1550-1750 (31-35 kcal/kg)  Protein:  70-85 grams (1.4-1.7 g/kg)  Fluid:  1.6-1.8 L  Betsey Holiday MS, RD, LDN Pager #- [phone] Office#- [phone] After Hours Pager:  [phone]

## 2018-01-05 NOTE — Plan of Care (Signed)
Continue with planned regimen 

## 2018-01-05 NOTE — Progress Notes (Signed)
[facility] - [place] at [facility]   PATIENT NAME: Andrea King    MR#:  [id number]  DATE OF BIRTH:  Dec 28, 1975  SUBJECTIVE:   Pain under control currently comfortable although asking to increase IV dilaudid and give oral dilaudid as well.  REVIEW OF SYSTEMS:  CONSTITUTIONAL: No fever, fatigue or weakness.  EYES: No blurred or double vision.  EARS, NOSE, AND THROAT: No tinnitus or ear pain.  RESPIRATORY: No cough, shortness of breath, wheezing or hemoptysis.  CARDIOVASCULAR: No chest pain, orthopnea, edema.  GASTROINTESTINAL: No nausea, vomiting, diarrhea or abdominal pain.  GENITOURINARY: No dysuria, hematuria.  ENDOCRINE: No polyuria, nocturia,  HEMATOLOGY: No anemia, easy bruising or bleeding SKIN: No rash or lesion. MUSCULOSKELETAL: No joint pain or arthritis.  Reporting pain in the back of her neck NEUROLOGIC: No tingling, numbness, weakness.  PSYCHIATRY: No anxiety or depression.   DRUG ALLERGIES:  No Known Allergies  VITALS:  Blood pressure 102/61, pulse 84, temperature 98.6 F (37 C), temperature source Oral, resp. rate 20, height 5\' 2"  (1.575 m), weight 52 kg, last menstrual period 12/20/2017, SpO2 100 %.  PHYSICAL EXAMINATION:  GENERAL:  42 y.o.-year-old patient lying in the bed with no acute distress.  EYES: Pupils equal, round, reactive to light and accommodation. No scleral icterus. Extraocular muscles intact.  HEENT: Head atraumatic, normocephalic. Oropharynx and nasopharynx clear.  NECK:  Supple, no jugular venous distention. No thyroid enlargement, posterior cervical aspect of the neck is tender to touch with decreased range of motion of the neck LUNGS: Normal breath sounds bilaterally, no wheezing, rales,rhonchi or crepitation. No use of accessory muscles of respiration.  CARDIOVASCULAR: S1, S2 normal. No murmurs, rubs, or gallops.  ABDOMEN: Soft, nontender, nondistended. Bowel sounds present. No organomegaly or mass.  EXTREMITIES:  No pedal edema, cyanosis, or clubbing.  NEUROLOGIC: Cranial nerves II through XII are intact. Muscle strength 5/5 in all extremities. Sensation intact. Gait not checked.  PSYCHIATRIC:  patient is alert and oriented x 3.  SKIN: No obvious rash, lesion, or ulcer.    LABORATORY PANEL:   CBC Recent Labs  Lab 01/04/18 0535  WBC 19.9*  HGB 10.6*  HCT 31.6*  PLT 593*   ------------------------------------------------------------------------------------------------------------------  Chemistries  Recent Labs  Lab 01/04/18 0535  NA 138  K 4.6  CL 101  CO2 30  GLUCOSE 110*  BUN 15  CREATININE 0.55  CALCIUM 9.5   ------------------------------------------------------------------------------------------------------------------  ASSESSMENT AND PLAN:   42 year old female with history of tobacco dependence and drug use who presents with neck pain and found to have discitis and epidural abscess.  1.Sepsis: Patient presents with tachycardia and leukocytosis.  Sepsis is from epidural abscess and discitis. Continue IV cefepime for 6 weeks.  PICC line placed on August 31st, 2019 Discussed the case with infectious disease TEE negative for vegetation Blood cultures positive for Serratia Marecens WBC count now 14.3--12.7--16.4--19K  2.  Acute Epidural abscess/discitis: Continue cefepime ID consultation done Patient evaluated by neurosurgery and IR unable to drain or biopsy, recommended antibiotics  3.Tobacco dependence: Patient is encouraged to quit smoking. Counseling was provided for 4 minutes.  4.  History of IV drug use She reports no drug use for the past 18 to 24 months.  5.  Oral thrush tinea nystatin swish and swallow  6. H/o drug abuse  -patient has been using her IV dilaudid on the dot with po oxycodone and norco. I have told her gradually will be weaning her off IV pain meds. -K-pad -she is  at a very high risk of addiction and drug seeking beahvior.  All the  records are reviewed and case discussed with Care Management/Social Workerr. Management plans discussed with the patient, family and they are in agreement.  CODE STATUS: full code  TOTAL TIME TAKING CARE OF THIS PATIENT: 25 minutes.   POSSIBLE D/C to be determined with IV drug use patient will likely need prolonged hospitalization atleast 6 weeks till she finishes her IV abx course  Note: This dictation was prepared with Dragon dictation along with smaller phrase technology. Any transcriptional errors that result from this process are unintentional.   [name] M.D on 01/05/2018 at 2:19 PM  Between 7am to 6pm - Pager - [phone] After 6pm go to [email or web address] - password [email or web address] [facility]  [facility]  Office  [phone]  CC: Primary care physician; Alberteen Spindle, CNM

## 2018-01-06 ENCOUNTER — Inpatient Hospital Stay: Payer: Self-pay

## 2018-01-06 LAB — CBC
HEMATOCRIT: 33.6 % — AB (ref 35.0–47.0)
Hemoglobin: 11.2 g/dL — ABNORMAL LOW (ref 12.0–16.0)
MCH: 26.7 pg (ref 26.0–34.0)
MCHC: 33.2 g/dL (ref 32.0–36.0)
MCV: 80.4 fL (ref 80.0–100.0)
Platelets: 670 10*3/uL — ABNORMAL HIGH (ref 150–440)
RBC: 4.18 MIL/uL (ref 3.80–5.20)
RDW: 15.5 % — AB (ref 11.5–14.5)
WBC: 22.1 10*3/uL — ABNORMAL HIGH (ref 3.6–11.0)

## 2018-01-06 MED ORDER — SODIUM CHLORIDE 0.9 % IV SOLN
INTRAVENOUS | Status: DC | PRN
  Administered 2018-01-08: 500 mL via INTRAVENOUS

## 2018-01-06 NOTE — Plan of Care (Signed)
Lying in bed c/o generalized pain 9/10 will medicate per protocol contin ue to follow planned regim en.

## 2018-01-06 NOTE — Progress Notes (Addendum)
[facility] - [place] at [facility]   PATIENT NAME: Andrea King    MR#:  [id number]  DATE OF BIRTH:  1975/07/16  SUBJECTIVE: Complains of right shoulder pain.  Requesting to take shower, also requesting K pad.   Patient still requesting IV Dilaudid.  REVIEW OF SYSTEMS:  CONSTITUTIONAL: No fever, fatigue or weakness.  EYES: No blurred or double vision.  EARS, NOSE, AND THROAT: No tinnitus or ear pain.  RESPIRATORY: No cough, shortness of breath, wheezing or hemoptysis.  CARDIOVASCULAR: No chest pain, orthopnea, edema.  GASTROINTESTINAL: No nausea, vomiting, diarrhea or abdominal pain.  GENITOURINARY: No dysuria, hematuria.  ENDOCRINE: No polyuria, nocturia,  HEMATOLOGY: No anemia, easy bruising or bleeding SKIN: No rash or lesion. MUSCULOSKELETAL: No joint pain or arthritis.  Reporting pain in the back of her neck NEUROLOGIC: No tingling, numbness, weakness.  PSYCHIATRY: No anxiety or depression.   DRUG ALLERGIES:  No Known Allergies  VITALS:  Blood pressure 99/60, pulse 91, temperature 98.5 F (36.9 C), temperature source Oral, resp. rate 18, height 5\' 2"  (1.575 m), weight 52 kg, last menstrual period 12/20/2017, SpO2 99 %.  PHYSICAL EXAMINATION:  GENERAL:  42 y.o.-year-old patient lying in the bed with no acute distress.  Looks malnourished EYES: Pupils equal, round, reactive to light and accommodation. No scleral icterus. Extraocular muscles intact.  HEENT: Head atraumatic, normocephalic. Oropharynx and nasopharynx clear.  NECK:  Supple, no jugular venous distention. No thyroid enlargement, posterior cervical aspect of the neck is tender to touch with decreased range of motion of the neck LUNGS: Normal breath sounds bilaterally, no wheezing, rales,rhonchi or crepitation. No use of accessory muscles of respiration.  CARDIOVASCULAR: S1, S2 normal. No murmurs, rubs, or gallops.  ABDOMEN: Soft, nontender, nondistended. Bowel sounds present. No  organomegaly or mass.  EXTREMITIES: No pedal edema, cyanosis, or clubbing.  NEUROLOGIC: Cranial nerves II through XII are intact. Muscle strength 5/5 in all extremities. Sensation intact. Gait not checked.  PSYCHIATRIC:  patient is alert and oriented x 3.  SKIN: No obvious rash, lesion, or ulcer.    LABORATORY PANEL:   CBC Recent Labs  Lab 01/04/18 0535  WBC 19.9*  HGB 10.6*  HCT 31.6*  PLT 593*   ------------------------------------------------------------------------------------------------------------------  Chemistries  Recent Labs  Lab 01/04/18 0535  NA 138  K 4.6  CL 101  CO2 30  GLUCOSE 110*  BUN 15  CREATININE 0.55  CALCIUM 9.5   ------------------------------------------------------------------------------------------------------------------  ASSESSMENT AND PLAN:   42 year old female with history of tobacco dependence and drug use who presents with neck pain and found to have discitis and epidural abscess.  1.Sepsis: Patient presents with tachycardia and leukocytosis.  Sepsis is from epidural abscess and discitis.  Patient blood culture showed Serratia.  Seen by ID. Continue IV cefepime for 6 weeks.  PICC line placed on August 31st, 2019  TEE negative for vegetation Blood cultures positive for Serratia Marecens WBC count now 14.3--12.7--16.4--19K And had a history of MRSA bacteremia, septic emboli in 2017, left AMA, she has hep C, history of tricuspid endocarditis, annuloplasty at [facility] before. 2.  Acute Epidural abscess/discitis: Continue cefepime ID consultation done Patient evaluated by neurosurgery and IR unable to drain or biopsy, recommended antibiotics  3.Tobacco dependence: Patient is encouraged to quit smoking. Counseling was provided for 4 minutes.  4.  History of IV drug use, She reports no drug use for the past 18 to 24 months.  But looks like patient is still using IV drugs, on regular basis.  Please try to avoid giving extra doses narcotics  as much as possible.  5.  Oral thrush tinea nystatin swish and swallow  6. H/o drug abuse  -patient has been using her IV dilaudid on the dot with po oxycodone and norco. I have told her gradually will be weaning her off IV pain meds. -K-pad -she is at a very high risk of addiction and drug seeking beahvior. 7. right shoulder pain: Get x-ray of right shoulder, use K pad, 8.nonsevere malnutrition in the context of chronic illness, seen by dietitian, continue Ensure, multivitamin. All the records are reviewed and case discussed with Care Management/Social Workerr. Management plans discussed with the patient, family and they are in agreement.  CODE STATUS: full code  TOTAL TIME TAKING CARE OF THIS PATIENT: 25 minutes.   POSSIBLE D/C to be determined with IV drug use patient will likely need prolonged hospitalization atleast 6 weeks till she finishes her IV abx course  Note: This dictation was prepared with Dragon dictation along with smaller phrase technology. Any transcriptional errors that result from this process are unintentional.   Katha Hamming M.D on 01/06/2018 at 9:05 AM  Between 7am to 6pm - Pager - [phone] After 6pm go to [email or web address] - password [email or web address] [facility]  [place] [facility]  Office  [phone]  CC: Primary care physician; Alberteen Spindle, CNM

## 2018-01-06 NOTE — Progress Notes (Signed)
Andrea King is a 42 y.o. female with IV drug use, history of MRSA bacteremia  With septic emboli to lungs in 2017 for which she did not complete treatment ( left AMA) , hepatitis C presented to [facility] on 12/28/2017 with neck pain for the past week, pain radiating to her shoulders.  In the ED her temperature was 98.6, heart rate of 107, respiratory rate of 19, blood pressure 107/94.  WBC was 19,  She had an MRI of the cervical spine which showed discitis C5-C6 with some ventral fluid collection, started on Vanco and Zosyn .  This fluid collection is not amenable to drainage by interventional radiology. I am asked to see the patient for management of her infection. As per patient the current problem started a week ago. She thought it was neck crick and then it got worse and started  radiating to her left arm. She also felt sweaty and hot. She denies any cough, chest pain, sob or GI symptoms. In July  2017 patient had MRSA in the blood and was admitted to [facility] but left AMA and did not complete treatment. she then ended up in [facility] in October 2019 and had strep sanguis bacteremia with tricuspid endocarditis and  on 03/02/16  she went to the OR for tricuspid valve replacement but it was noted that the anterior leaflet was perforated and no vegetation present so the leaflet was repaired and a 30 mm ring annuloplasty performed as well as PFO closure.During that hospitalization she was on antibiotics and after surgery had daptomycin for 2 weeks.  Subjective C/o of rt shoulder pain Neck pain better  OBJECTIVE:BP 104/60 (BP Location: Left Arm)   Pulse (!) 101   Temp 99.2 F (37.3 C) (Oral)   Resp 16   Ht 5\' 2"  (1.575 m)   Wt 52 kg   LMP 12/20/2017 (Approximate)   SpO2 100%   BMI 20.97 kg/m   Physical Exam  Constitutional: looks better, no distress, pale HENT:  Head: Normocephalic.  Eyes: Pupils are equal, round, and reactive to light.  Neck:    - did not check for  neck rigidity or movt  Cardiovascular:  s1s2 2/6 Systolic murmur  Sternal scar  Pulmonary/Chest:  B/l air entry Decreased bases   Abdominal: Soft. Bowel sounds are normal. She exhibits no distension.  Musculoskeletal: tenderness over lateral aspect of shoulder joint and below   Neurological: She is alert and oriented to person, place, and time.  No focal weakness  Skin:  Multiple excoriations and scars legs     Lab Results CBC Latest Ref Rng & Units 01/06/2018 01/04/2018 01/02/2018  WBC 3.6 - 11.0 K/uL 22.1(H) 19.9(H) 16.4(H)  Hemoglobin 12.0 - 16.0 g/dL 11.2(L) 10.6(L) 10.3(L)  Hematocrit 35.0 - 47.0 % 33.6(L) 31.6(L) 30.2(L)  Platelets 150 - 440 K/uL 670(H) 593(H) 425   CMP Latest Ref Rng & Units 01/04/2018 12/31/2017 12/30/2017  Glucose 70 - 99 mg/dL 280(K) 349(Z) -  BUN 6 - 20 mg/dL 15 8 -  Creatinine 7.91 - 1.00 mg/dL 5.05 6.97 9.48  Sodium 135 - 145 mmol/L 138 132(L) -  Potassium 3.5 - 5.1 mmol/L 4.6 3.6 -  Chloride 98 - 111 mmol/L 101 101 -  CO2 22 - 32 mmol/L 30 25 -  Calcium 8.9 - 10.3 mg/dL 9.5 0.1(K) -  Total Protein 6.5 - 8.1 g/dL - - -  Total Bilirubin 0.3 - 1.2 mg/dL - - -  Alkaline Phos 38 - 126 U/L - - -  AST 15 - 41 U/L - - -  ALT 0 - 44 U/L - - -      Microbiology:. 12/28/17 Serratia in blood culture 12/31/17 BC    Disc degeneration and spurring at C5-6. Findings of diskitis at C5-6 with endplate irregularity and mild bone marrow edema enhancement. There is ventral epidural fluid collection with surrounding epidural enhancement surrounding the cord. Given the clinical information appearance this is most consistent with discitis with epidural abscess. There is swelling of the cord with edema and mild spinal stenosis. Radiographs and labs were personally reviewed by me.   Assessment and Plan 42 y.o. female with IV drug use, history of MRSA bacteremia  With septic emboli to lungs in 2017 for which she did not complete treatment ( left AMA) , hepatitis  C presented to [facility] on 12/28/2017 with neck pain for the past week, pain radiating to her shoulders.  In the ED her temperature was 98.6, heart rate of 107, respiratory rate of 19, blood pressure 107/94.  WBC was 19,  She had an MRI of the cervical spine which showed discitis C5-C6 with some ventral fluid collection, started on Vanco and Zosyn .  This fluid collection is not amenable to drainage by interventional radiology. I am asked to see the patient for management of her infection.   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 Has a history of IVDA even though she denies doing it in the last past 2 years.  Very likely the infection is related to the same practice.  Tox positive for cocaine. Not amenable for IR drainage.    Serratia bacteremia.  Serratia is an organism found in water and very likely this is due to IV drug use.  She has not been in any hospital recently. On  Cefepime. Will need for 6 weeks Will need to rule out endocarditis. 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

## 2018-01-07 MED ORDER — NICOTINE 14 MG/24HR TD PT24
14.0000 mg | MEDICATED_PATCH | Freq: Every day | TRANSDERMAL | Status: DC
  Administered 2018-01-07 – 2018-01-11 (×5): 14 mg via TRANSDERMAL
  Filled 2018-01-07 (×5): qty 1

## 2018-01-07 MED ORDER — LORAZEPAM 2 MG/ML IJ SOLN
1.0000 mg | Freq: Once | INTRAMUSCULAR | Status: AC | PRN
  Administered 2018-01-08: 1 mg via INTRAVENOUS
  Filled 2018-01-07: qty 1

## 2018-01-07 MED ORDER — LORAZEPAM 2 MG/ML IJ SOLN: 1.0000 mg | Freq: Once | INTRAMUSCULAR | Status: DC

## 2018-01-07 NOTE — Progress Notes (Signed)
[facility] - [place] at [facility]   PATIENT NAME: Andrea King    MR#:  [id number]  DATE OF BIRTH:  05-Mar-1976  SUBJECTIVE: Still complains of right shoulder pain, x-ray of the right shoulder did not show any acute changes yesterday.  Worsening leukocytosis.     REVIEW OF SYSTEMS:  CONSTITUTIONAL: No fever, fatigue or weakness.  EYES: No blurred or double vision.  EARS, NOSE, AND THROAT: No tinnitus or ear pain.  RESPIRATORY: No cough, shortness of breath, wheezing or hemoptysis.  CARDIOVASCULAR: No chest pain, orthopnea, edema.  GASTROINTESTINAL: No nausea, vomiting, diarrhea or abdominal pain.  GENITOURINARY: No dysuria, hematuria.  ENDOCRINE: No polyuria, nocturia,  HEMATOLOGY: No anemia, easy bruising or bleeding SKIN: No rash or lesion. MUSCULOSKELETAL: No joint pain or arthritis.  Reporting pain in the back of her neck NEUROLOGIC: No tingling, numbness, weakness.  PSYCHIATRY: No anxiety or depression.   DRUG ALLERGIES:  No Known Allergies  VITALS:  Blood pressure 116/63, pulse 94, temperature 99.2 F (37.3 C), temperature source Oral, resp. rate 19, height 5\' 2"  (1.575 m), weight 52 kg, last menstrual period 12/20/2017, SpO2 100 %.  PHYSICAL EXAMINATION:  GENERAL:  42 y.o.-year-old patient lying in the bed with no acute distress.  Looks malnourished EYES: Pupils equal, round, reactive to light and accommodation. No scleral icterus. Extraocular muscles intact.  HEENT: Head atraumatic, normocephalic. Oropharynx and nasopharynx clear.  NECK:  Supple, no jugular venous distention. No thyroid enlargement, posterior cervical aspect of the neck is tender to touch with decreased range of motion of the neck LUNGS: Normal breath sounds bilaterally, no wheezing, rales,rhonchi or crepitation. No use of accessory muscles of respiration.  CARDIOVASCULAR: S1, S2 normal. No murmurs, rubs, or gallops.  ABDOMEN: Soft, nontender, nondistended. Bowel sounds  present. No organomegaly or mass.  EXTREMITIES: No pedal edema, cyanosis, or clubbing.  NEUROLOGIC: Cranial nerves II through XII are intact. Muscle strength 5/5 in all extremities. Sensation intact. Gait not checked.  PSYCHIATRIC:  patient is alert and oriented x 3.  SKIN: No obvious rash, lesion, or ulcer.    LABORATORY PANEL:   CBC Recent Labs  Lab 01/06/18 0927  WBC 22.1*  HGB 11.2*  HCT 33.6*  PLT 670*   ------------------------------------------------------------------------------------------------------------------  Chemistries  Recent Labs  Lab 01/04/18 0535  NA 138  K 4.6  CL 101  CO2 30  GLUCOSE 110*  BUN 15  CREATININE 0.55  CALCIUM 9.5   ------------------------------------------------------------------------------------------------------------------  ASSESSMENT AND PLAN:   42 year old female with history of tobacco dependence and drug use who presents with neck pain and found to have discitis and epidural abscess.  1.Sepsis: Patient presents with tachycardia and leukocytosis.  Sepsis is from epidural abscess and discitis.  Patient blood culture showed Serratia.  Seen by ID. Continue IV cefepime for 6 weeks.,  Patient on day 10 of 6 weeks. PICC line placed on August 31st, 2019  TEE negative for vegetation Blood cultures positive for Serratia Marecens Worsening leukocytosis, right shoulder pain.the right shoulder did not show any fracture or infection.   MRI neck, right shoulder recommended by ID to look for worsening or new infection.    History of MRSA bacteremia, septic emboli in 2017, left AMA, she has hep C, history of tricuspid endocarditis, annuloplasty at [facility] before. 2.  Acute Epidural abscess/discitis: Continue cefepime ID consultation done Patient evaluated by neurosurgery and IR unable to drain or biopsy, recommended antibiotics  3.Tobacco dependence: Patient is encouraged to quit smoking. Counseling was provided for  4 minutes.  4.   History of IV drug use, She reports no drug use for the past 18 to 24 months.  But looks like patient is still using IV drugs, on regular basis.  Please try to avoid giving extra doses narcotics as much as possible.  5.  Oral thrush tinea nystatin swish and swallow  6. H/o drug abuse  -patient has been using her IV dilaudid on the dot with po oxycodone and norco. I have told her gradually will be weaning her off IV pain meds. -K-pad -she is at a very high risk of addiction and drug seeking beahvior. 7. right shoulder pain: Get x-ray of right shoulder, use K pad, 8.nonsevere malnutrition in the context of chronic illness, seen by dietitian, continue Ensure, multivitamin. All the records are reviewed and case discussed with Care Management/Social Workerr. Management plans discussed with the patient, family and they are in agreement.  CODE STATUS: full code  TOTAL TIME TAKING CARE OF THIS PATIENT: 25 minutes.   POSSIBLE D/C to be determined with IV drug use patient will likely need prolonged hospitalization atleast 6 weeks till she finishes her IV abx course  Note: This dictation was prepared with Dragon dictation along with smaller phrase technology. Any transcriptional errors that result from this process are unintentional.   Katha Hamming M.D on 01/07/2018 at 1:59 PM  Between 7am to 6pm - Pager - [phone] After 6pm go to [email or web address] - password [email or web address] [facility]  [place] [facility]  Office  [phone]  CC: Primary care physician; Alberteen Spindle, CNM

## 2018-01-07 NOTE — Progress Notes (Signed)
Order received from Dr Luberta Mutter to change nicotine patch to 14mg  from 21 mg

## 2018-01-07 NOTE — Progress Notes (Signed)
MRI notified me to inform me that the patient required something for anxiety the last time an MRI was done on this admission

## 2018-01-08 ENCOUNTER — Inpatient Hospital Stay: Payer: Self-pay

## 2018-01-08 LAB — CBC
HCT: 27.3 % — ABNORMAL LOW (ref 35.0–47.0)
HEMOGLOBIN: 9.1 g/dL — AB (ref 12.0–16.0)
MCH: 27 pg (ref 26.0–34.0)
MCHC: 33.5 g/dL (ref 32.0–36.0)
MCV: 80.6 fL (ref 80.0–100.0)
Platelets: 552 10*3/uL — ABNORMAL HIGH (ref 150–440)
RBC: 3.38 MIL/uL — ABNORMAL LOW (ref 3.80–5.20)
RDW: 15.2 % — ABNORMAL HIGH (ref 11.5–14.5)
WBC: 13 10*3/uL — ABNORMAL HIGH (ref 3.6–11.0)

## 2018-01-08 MED ORDER — HYDROMORPHONE HCL 1 MG/ML IJ SOLN
1.0000 mg | Freq: Four times a day (QID) | INTRAMUSCULAR | Status: DC | PRN
  Administered 2018-01-08 – 2018-01-09 (×3): 1 mg via INTRAVENOUS
  Filled 2018-01-08 (×3): qty 1

## 2018-01-08 MED ORDER — KETOROLAC TROMETHAMINE 15 MG/ML IJ SOLN
15.0000 mg | Freq: Three times a day (TID) | INTRAMUSCULAR | Status: DC | PRN
  Administered 2018-01-08 – 2018-01-11 (×6): 15 mg via INTRAVENOUS
  Filled 2018-01-08 (×6): qty 1

## 2018-01-08 MED ORDER — GADOBENATE DIMEGLUMINE 529 MG/ML IV SOLN
10.0000 mL | Freq: Once | INTRAVENOUS | Status: AC | PRN
  Administered 2018-01-08: 10 mL via INTRAVENOUS

## 2018-01-08 MED ORDER — OXYCODONE HCL ER 10 MG PO T12A
10.0000 mg | EXTENDED_RELEASE_TABLET | Freq: Two times a day (BID) | ORAL | Status: DC
  Administered 2018-01-08 – 2018-01-11 (×5): 10 mg via ORAL
  Filled 2018-01-08 (×6): qty 1

## 2018-01-08 NOTE — Progress Notes (Signed)
[facility] - [place] at [facility]   PATIENT NAME: Andrea King    MR#:  [id number]  DATE OF BIRTH:  November 16, 1975     Continues to complain of pain in neck, back, right shoulder. REVIEW OF SYSTEMS:  CONSTITUTIONAL: No fever, fatigue or weakness.  EYES: No blurred or double vision.  EARS, NOSE, AND THROAT: No tinnitus or ear pain.  RESPIRATORY: No cough, shortness of breath, wheezing or hemoptysis.  CARDIOVASCULAR: No chest pain, orthopnea, edema.  GASTROINTESTINAL: No nausea, vomiting, diarrhea or abdominal pain.  GENITOURINARY: No dysuria, hematuria.  ENDOCRINE: No polyuria, nocturia,  HEMATOLOGY: No anemia, easy bruising or bleeding SKIN: No rash or lesion. MUSCULOSKELETAL: No joint pain or arthritis.  Reporting pain in the back of her neck NEUROLOGIC: No tingling, numbness, weakness.  PSYCHIATRY: Anxiety  DRUG ALLERGIES:  No Known Allergies  VITALS:  Blood pressure 103/72, pulse 88, temperature 98.1 F (36.7 C), temperature source Oral, resp. rate 16, height 5\' 2"  (1.575 m), weight 52 kg, last menstrual period 12/20/2017, SpO2 100 %.  PHYSICAL EXAMINATION:  GENERAL:  42 y.o.-year-old patient lying in the bed with no acute distress.  EYES: Pupils equal, round, reactive to light and accommodation. No scleral icterus.  HEENT: Head atraumatic, normocephalic. Oropharynx and nasopharynx clear.  NECK:  Supple, no jugular venous distention. No thyroid enlargement, posterior cervical aspect of the neck is tender to touch with decreased range of motion of the neck LUNGS: Normal breath sounds bilaterally, no wheezing, rales,rhonchi or crepitation. CARDIOVASCULAR: S1, S2 normal. No murmurs, rubs, or gallops.  ABDOMEN: Soft, nontender, nondistended. Bowel sounds present. No organomegaly or mass.  EXTREMITIES: No pedal edema, cyanosis, or clubbing.  NEUROLOGIC: Cranial nerves II through XII are intact. Muscle strength 5/5 in all extremities. Sensation intact.  Gait not checked.  PSYCHIATRIC:  patient is alert and oriented x 3. Anxious SKIN: No obvious rash, lesion, or ulcer.   LABORATORY PANEL:   CBC Recent Labs  Lab 01/08/18 0538  WBC 13.0*  HGB 9.1*  HCT 27.3*  PLT 552*   ------------------------------------------------------------------------------------------------------------------  Chemistries  Recent Labs  Lab 01/04/18 0535  NA 138  K 4.6  CL 101  CO2 30  GLUCOSE 110*  BUN 15  CREATININE 0.55  CALCIUM 9.5   ------------------------------------------------------------------------------------------------------------------  ASSESSMENT AND PLAN:   42 year old female with history of tobacco dependence and drug use who presents with neck pain and found to have discitis and epidural abscess.  * C5-6 discitis and epidural abscess Could not be drained by IR or Neurosurgery B cx with Serratia marecens WBC better today Continue cefepime. Day 11 PICC line in place TEE negative for vegetation  MRI right shoulder with no signs of infection   History of MRSA bacteremia, septic emboli in 2017, left AMA, she has hep C, history of tricuspid endocarditis, annuloplasty at [facility] before.  *Tobacco dependence counseled to quit on admission  *  History of IV drug use, High risk if discharged with PICC  *  Oral thrush nystatin swish and swallow  * Chronic pain syndrome Decrease IV dilaudid today and stop tomorrow Continue oxycontine and Norco  * nonsevere malnutrition in the context of chronic illness, seen by dietitian, continue Ensure, multivitamin  All the records are reviewed and case discussed with Care Management/Social Workerr. Management plans discussed with the patient, family and they are in agreement.  CODE STATUS: Full code  TOTAL TIME TAKING CARE OF THIS PATIENT: 35 minutes.   Note: This dictation was prepared with Reubin Milan  dictation along with smaller phrase technology. Any transcriptional errors that result  from this process are unintentional.  Orie Fisherman M.D on 01/08/2018 at 11:03 AM  Between 7am to 6pm - Pager - [phone]  After 6pm go to [email or web address] - password [email or web address] [facility]  [place] [facility]  Office  [phone]  CC: Primary care physician; Alberteen Spindle, CNM

## 2018-01-09 MED ORDER — OXYCODONE HCL 5 MG PO TABS
5.0000 mg | ORAL_TABLET | ORAL | Status: DC | PRN
  Administered 2018-01-09 – 2018-01-10 (×6): 5 mg via ORAL
  Filled 2018-01-09 (×6): qty 1

## 2018-01-09 NOTE — Progress Notes (Signed)
[facility] - [place] at [facility]   PATIENT NAME: Andrea King    MR#:  [id number]  DATE OF BIRTH:  Oct 24, 1975     Continues to complain of pain in neck, back, right shoulder. Up and ambulating.  REVIEW OF SYSTEMS:  CONSTITUTIONAL: No fever, fatigue or weakness.  EYES: No blurred or double vision.  EARS, NOSE, AND THROAT: No tinnitus or ear pain.  RESPIRATORY: No cough, shortness of breath, wheezing or hemoptysis.  CARDIOVASCULAR: No chest pain, orthopnea, edema.  GASTROINTESTINAL: No nausea, vomiting, diarrhea or abdominal pain.  GENITOURINARY: No dysuria, hematuria.  ENDOCRINE: No polyuria, nocturia,  HEMATOLOGY: No anemia, easy bruising or bleeding SKIN: No rash or lesion. MUSCULOSKELETAL: No joint pain or arthritis.  Reporting pain in the back of her neck NEUROLOGIC: No tingling, numbness, weakness.  PSYCHIATRY: Anxiety  DRUG ALLERGIES:  No Known Allergies  VITALS:  Blood pressure (!) 90/58, pulse 86, temperature 98.6 F (37 C), temperature source Oral, resp. rate 16, height 5\' 2"  (1.575 m), weight 52 kg, last menstrual period 12/20/2017, SpO2 96 %.  PHYSICAL EXAMINATION:  GENERAL:  42 y.o.-year-old patient lying in the bed with no acute distress.  EYES: Pupils equal, round, reactive to light and accommodation. No scleral icterus.  HEENT: Head atraumatic, normocephalic. Oropharynx and nasopharynx clear.  NECK:  Supple, no jugular venous distention. No thyroid enlargement, posterior cervical aspect of the neck is tender to touch with decreased range of motion of the neck LUNGS: Normal breath sounds bilaterally, no wheezing, rales,rhonchi or crepitation. CARDIOVASCULAR: S1, S2 normal. No murmurs, rubs, or gallops.  ABDOMEN: Soft, nontender, nondistended. Bowel sounds present. No organomegaly or mass.  EXTREMITIES: No pedal edema, cyanosis, or clubbing.  NEUROLOGIC: Cranial nerves II through XII are intact. Muscle strength 5/5 in all  extremities. Sensation intact. Gait not checked.  PSYCHIATRIC:  patient is alert and oriented x 3. Anxious SKIN: No obvious rash, lesion, or ulcer.   LABORATORY PANEL:   CBC Recent Labs  Lab 01/08/18 0538  WBC 13.0*  HGB 9.1*  HCT 27.3*  PLT 552*   ------------------------------------------------------------------------------------------------------------------  Chemistries  Recent Labs  Lab 01/04/18 0535  NA 138  K 4.6  CL 101  CO2 30  GLUCOSE 110*  BUN 15  CREATININE 0.55  CALCIUM 9.5   ------------------------------------------------------------------------------------------------------------------  ASSESSMENT AND PLAN:   42 year old female with history of tobacco dependence and drug use who presents with neck pain and found to have discitis and epidural abscess.  * C5-6 discitis and epidural abscess Could not be drained by IR or Neurosurgery B cx with Serratia marecens WBC better today Continue cefepime. Day 12 PICC line in place TEE negative for vegetation  MRI right shoulder with no signs of infection   History of MRSA bacteremia, septic emboli in 2017, left AMA, she has hep C, history of tricuspid endocarditis, annuloplasty at [facility] before.  Will have 2 consider IV drug use and PICC line with disposition plan  *Tobacco dependence counseled to quit on admission  *  History of IV drug use, High risk if discharged with PICC  *  Oral thrush nystatin swish and swallow  * Chronic pain syndrome Stop IV Dilaudid today.  Replace Norco with oxycodone.  Continue OxyContin.  * Nonsevere malnutrition in the context of chronic illness, seen by dietitian, continue Ensure, multivitamin  All the records are reviewed and case discussed with Care Management/Social Workerr. Management plans discussed with the patient, family and they are in agreement.  CODE STATUS: Full  code  TOTAL TIME TAKING CARE OF THIS PATIENT: 30 minutes.   Note: This dictation was  prepared with Dragon dictation along with smaller phrase technology. Any transcriptional errors that result from this process are unintentional.  Orie Fisherman M.D on 01/09/2018 at 9:55 AM  Between 7am to 6pm - Pager - [phone]  After 6pm go to [email or web address] - password [email or web address]  [place] [facility]  Office  [phone]  CC: Primary care physician; Alberteen Spindle, CNM

## 2018-01-09 NOTE — Progress Notes (Signed)
Patient laughing about something on TV, calm, VSS and no outward signs of distress; complains of 8/10 right neck pain; wanting Toradol, because "..the oxycodone isn't helping..the pain has not gone down..."  By the time this RN was leaving the room, patient was moaning and grunting.  Will continue to monitor. Windy Carina, RN 10:35 PM 01/09/2018

## 2018-01-10 MED ORDER — KETOROLAC TROMETHAMINE 30 MG/ML IJ SOLN
INTRAMUSCULAR | Status: AC
  Administered 2018-01-10: 15 mg
  Filled 2018-01-10: qty 1

## 2018-01-10 MED ORDER — OXYCODONE HCL 5 MG PO TABS
10.0000 mg | ORAL_TABLET | Freq: Four times a day (QID) | ORAL | Status: DC | PRN
  Administered 2018-01-10 – 2018-01-11 (×4): 10 mg via ORAL
  Filled 2018-01-10 (×5): qty 2

## 2018-01-10 NOTE — Progress Notes (Addendum)
Found a white pill in pt's bed, round with 10 on one side and OP on the other. Pt asked if she could have it. Pt educated she cannot have medication not prescribed by MD or outside medication. Pt also educated she cannot save medication for later. Juan RN at bedside, verified pill placed in sharps box for disposal. MD notified. No further orders at this time.

## 2018-01-10 NOTE — Clinical Social Work Note (Signed)
Patient's nurse consulted CSW for "needs discharge plan." Patient's discharge plan is to remain in the hospital for 6 weeks to obtain the IV antibiotics as she is an IV drug user and cannot leave hospital with an open access. Treatment team of patient is in agreement that this is the safest plan. York Spaniel MSW,LCSW [phone]

## 2018-01-10 NOTE — Progress Notes (Signed)
[facility] - [place] at [facility]   PATIENT NAME: Andrea King    MR#:  [id number]  DATE OF BIRTH:  Feb 11, 1976     Continues to complain of pain in neck, back, right shoulder.  No other concerns  Afebrile  REVIEW OF SYSTEMS:  CONSTITUTIONAL: No fever, fatigue or weakness.  EYES: No blurred or double vision.  EARS, NOSE, AND THROAT: No tinnitus or ear pain.  RESPIRATORY: No cough, shortness of breath, wheezing or hemoptysis.  CARDIOVASCULAR: No chest pain, orthopnea, edema.  GASTROINTESTINAL: No nausea, vomiting, diarrhea or abdominal pain.  GENITOURINARY: No dysuria, hematuria.  ENDOCRINE: No polyuria, nocturia,  HEMATOLOGY: No anemia, easy bruising or bleeding SKIN: No rash or lesion. MUSCULOSKELETAL: No joint pain or arthritis.  Reporting pain in the back of her neck NEUROLOGIC: No tingling, numbness, weakness.  PSYCHIATRY: Anxiety  DRUG ALLERGIES:  No Known Allergies  VITALS:  Blood pressure (!) 94/42, pulse 80, temperature 98.2 F (36.8 C), temperature source Oral, resp. rate 18, height 5\' 2"  (1.575 m), weight 52 kg, last menstrual period 12/20/2017, SpO2 98 %.  PHYSICAL EXAMINATION:  GENERAL:  42 y.o.-year-old patient lying in the bed with no acute distress.  EYES: Pupils equal, round, reactive to light and accommodation. No scleral icterus.  HEENT: Head atraumatic, normocephalic. Oropharynx and nasopharynx clear.  NECK:  Supple, no jugular venous distention. No thyroid enlargement, posterior cervical aspect of the neck is tender to touch with decreased range of motion of the neck LUNGS: Normal breath sounds bilaterally, no wheezing, rales,rhonchi or crepitation. CARDIOVASCULAR: S1, S2 normal. No murmurs, rubs, or gallops.  ABDOMEN: Soft, nontender, nondistended. Bowel sounds present. No organomegaly or mass.  EXTREMITIES: No pedal edema, cyanosis, or clubbing.  NEUROLOGIC: Cranial nerves II through XII are intact. Muscle strength 5/5 in  all extremities. Sensation intact. Gait not checked.  PSYCHIATRIC:  patient is alert and oriented x 3. Anxious. SKIN: No obvious rash, lesion, or ulcer.   LABORATORY PANEL:   CBC Recent Labs  Lab 01/08/18 0538  WBC 13.0*  HGB 9.1*  HCT 27.3*  PLT 552*   ------------------------------------------------------------------------------------------------------------------  Chemistries  Recent Labs  Lab 01/04/18 0535  NA 138  K 4.6  CL 101  CO2 30  GLUCOSE 110*  BUN 15  CREATININE 0.55  CALCIUM 9.5   ------------------------------------------------------------------------------------------------------------------  ASSESSMENT AND PLAN:   42 year old female with history of tobacco dependence and drug use who presents with neck pain and found to have discitis and epidural abscess.  * C5-6 discitis and epidural abscess Could not be drained by IR or Neurosurgery B cx with Serratia marecens. WBC improved Continue cefepime. Day 13 PICC line in place. TEE negative for vegetation.  MRI right shoulder with no signs of infection   History of MRSA bacteremia, septic emboli in 2017, left AMA, she has hep C, history of tricuspid endocarditis, annuloplasty at [facility] before.  *Tobacco dependence counseled to quit on admission  *  History of IV drug use, High risk if discharged with PICC  *  Oral thrush nystatin swish and swallow  * Chronic pain syndrome Increase oxycodone to 10 mg Q 6 hrs PRN  * Nonsevere malnutrition in the context of chronic illness, seen by dietitian, continue Ensure, multivitamin  All the records are reviewed and case discussed with Care Management/Social Workerr. Management plans discussed with the patient, family and they are in agreement.  CODE STATUS: Full code  TOTAL TIME TAKING CARE OF THIS PATIENT: 30 minutes.   Note: This  dictation was prepared with Dragon dictation along with smaller phrase technology. Any transcriptional errors that result from  this process are unintentional.  Orie Fisherman M.D on 01/10/2018 at 10:40 AM  Between 7am to 6pm - Pager - [phone]  After 6pm go to [email or web address] - password [email or web address] [facility]  [place] [facility]  Office  [phone]  CC: Primary care physician; Alberteen Spindle, CNM

## 2018-01-11 LAB — CREATININE, SERUM
Creatinine, Ser: 0.6 mg/dL (ref 0.44–1.00)
GFR calc Af Amer: >60 mL/min (ref >60)
GFR calc non Af Amer: >60 mL/min (ref >60)

## 2018-01-11 LAB — SEDIMENTATION RATE: SED RATE: 25 mm/h — AB (ref 0–20)

## 2018-01-11 MED ORDER — LEVOFLOXACIN 750 MG PO TABS: 750.0000 mg | ORAL_TABLET | Freq: Every day | ORAL | 0 refills | Status: DC

## 2018-01-11 NOTE — Progress Notes (Signed)
[place] at [place] NAME: Andrea King    MR#:  [id number]  DATE OF BIRTH:  [birth year]     Continues to complain of pain in neck, back, right shoulder.  Unknown pill found in patients bed. Seemed to be oxycodone? Patient walked off the floor with PICC. H/o drug abuse.  Tele sitter placed and patient moved close to Nurses station  Fever 101 overnight  REVIEW OF SYSTEMS:  CONSTITUTIONAL: No fever, fatigue or weakness.  EYES: No blurred or double vision.  EARS, NOSE, AND THROAT: No tinnitus or ear pain.  RESPIRATORY: No cough, shortness of breath, wheezing or hemoptysis.  CARDIOVASCULAR: No chest pain, orthopnea, edema.  GASTROINTESTINAL: No nausea, vomiting, diarrhea or abdominal pain.  GENITOURINARY: No dysuria, hematuria.  ENDOCRINE: No polyuria, nocturia,  HEMATOLOGY: No anemia, easy bruising or bleeding SKIN: No rash or lesion. MUSCULOSKELETAL: No joint pain or arthritis.  Reporting pain in the back of her neck NEUROLOGIC: No tingling, numbness, weakness.  PSYCHIATRY: Anxiety  DRUG ALLERGIES:  No Known Allergies  VITALS:  Blood pressure (!) 108/56, pulse 84, temperature 98.4 F (36.9 C), temperature source Oral, resp. rate 16, height '5\' 2"'$  (1.575 m), weight 52 kg, last menstrual period 12/20/2017, SpO2 96 %.  PHYSICAL EXAMINATION:  GENERAL:  42 y.o.-year-old patient lying in the bed with no acute distress.  EYES: Pupils equal, round, reactive to light and accommodation. No scleral icterus.  HEENT: Head atraumatic, normocephalic. Oropharynx and nasopharynx clear.  NECK:  Supple, no jugular venous distention. No thyroid enlargement, posterior cervical aspect of the neck is tender to touch with decreased range of motion of the neck LUNGS: Normal breath sounds bilaterally, no wheezing, rales,rhonchi or crepitation. CARDIOVASCULAR: S1, S2 normal. No murmurs, rubs, or gallops.  ABDOMEN: Soft, nontender, nondistended.  Bowel sounds present. No organomegaly or mass.  EXTREMITIES: No pedal edema, cyanosis, or clubbing.  NEUROLOGIC: Cranial nerves II through XII are intact. Muscle strength 5/5 in all extremities. Sensation intact. Gait not checked.  PSYCHIATRIC:  patient is alert and oriented x 3. Anxious. SKIN: No obvious rash, lesion, or ulcer.   LABORATORY PANEL:   CBC Recent Labs  Lab 01/08/18 0538  WBC 13.0*  HGB 9.1*  HCT 27.3*  PLT 552*   ------------------------------------------------------------------------------------------------------------------  Chemistries  Recent Labs  Lab 01/11/18 0519  CREATININE 0.60   ------------------------------------------------------------------------------------------------------------------  ASSESSMENT AND PLAN:   42 year old female with history of tobacco dependence and drug use who presents with neck pain and found to have discitis and epidural abscess.  * C5-6 discitis and epidural abscess Could not be drained by IR or Neurosurgery B cx with Serratia marecens. WBC improved Continue cefepime. Day 14 PICC line in place. TEE negative for vegetation.  Fever overnight. Discussed with ID. Blood cx. Check ESR, CBC. Patient refusing labs. Wants to leave AMA as she is not happy staying here.  MRI right shoulder with no signs of infection   History of MRSA bacteremia, septic emboli in 2017, left AMA, she has hep C, history of tricuspid endocarditis, annuloplasty at [facility] before.  *Tobacco dependence counseled to quit on admission  *  History of IV drug use High risk if discharged with PICC  *  Oral thrush nystatin swish and swallow  * Chronic pain syndrome On oxycontin and oxycodone  * Nonsevere malnutrition in the context of chronic illness, seen by dietitian, continue Ensure, multivitamin  All the records are reviewed and case discussed with Care Management/Social Worker Management plans  discussed with the patient, family and they are in  agreement.  CODE STATUS: Full code  TOTAL TIME TAKING CARE OF THIS PATIENT: 30 minutes.   Note: This dictation was prepared with Dragon dictation along with smaller phrase technology. Any transcriptional errors that result from this process are unintentional.  Neita Carp M.D on 01/11/2018 at 1:30 PM  Between 7am to 6pm - Pager - [phone]  After 6pm go to [email or web address] - password [email or web address] [place] [facility]  Office  [phone]  CC: Primary care physician; [name], CNM

## 2018-01-11 NOTE — Progress Notes (Signed)
Nutrition Follow Up Note  DOCUMENTATION CODES:   Non-severe (moderate) malnutrition in context of social or environmental circumstances  INTERVENTION:   -Ensure Enlive po TID, each supplement provides 350 kcal and 20 grams of protein (vanilla flavor)  - MVI with minerals daily  NUTRITION DIAGNOSIS:   Moderate Malnutrition related to social/environmental circumstances (polysubstance abuse) as evidenced by mild to moderate fat depletions, mild to moderate muscle depletion.  GOAL:   Patient will meet greater than or equal to 90% of their needs  -progressing   MONITOR:   PO intake, Supplement acceptance, Weight trends, Labs  ASSESSMENT:   42 year old female who presented to the ED with neck pain. MRI of the spine showed discitis and epidural abscess. 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   Pt with good appetite and oral intake; eating 100% of meals and drinking Ensure. Per chart pt is weight stable since admit. Recommend continue supplements and MVI.   Medications reviewed and include: lovenox, MVI, nicotine, oxycodone, cefepime  Labs reviewed:   Diet Order            Diet regular Room service appropriate? Yes; Fluid consistency: Thin  Diet effective now             EDUCATION NEEDS:   No education needs have been identified at this time  Skin:  Skin Assessment: Reviewed RN Assessment (scattered abrasions)  Last BM:  9/9  Height:   Ht Readings from Last 1 Encounters:  01/04/18 5\' 2"  (1.575 m)    Weight:   Wt Readings from Last 1 Encounters:  01/04/18 52 kg    Ideal Body Weight:  50 kg  BMI:  Body mass index is 20.97 kg/m.  Estimated Nutritional Needs:   Kcal:  1550-1750 (31-35 kcal/kg)  Protein:  70-85 grams (1.4-1.7 g/kg)  Fluid:  1.6-1.8 L  Betsey Holiday MS, RD, LDN Pager #- [phone] Office#- [phone] After Hours Pager: [phone]

## 2018-01-12 NOTE — ED Provider Notes (Signed)
[facility] Emergency Department Provider Note       Time seen: ----------------------------------------- 3:03 PM on 01/12/2018 -----------------------------------------   I have reviewed the triage vital signs and the nursing notes.  HISTORY   Chief Complaint Neck Pain    HPI Andrea King is a 42 y.o. female with a history of anemia, asthma, degenerative joint disease, hepatitis C, osteomyelitis and substance abuse who presents to the ED for worsening complaints of neck pain for the past week.  Patient states the pain radiates into both shoulders.  She denies any injuries.  She has severe pain with range of motion of her neck.  Patient also has fevers, chills and diaphoresis.  Past Medical History:  Diagnosis Date  . Anemia   . Asthma   . DJD (degenerative joint disease)   . Hepatitis C   . Substance abuse [facility])     Patient Active Problem List   Diagnosis Date Noted  . Malnutrition of moderate degree 12/29/2017  . 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 Palliative care by specialist   . Goals of care, counseling/discussion   . Periorbital cellulitis 09/20/2017  . Hypoglycemia 09/20/2017  . Bacteremia due to Staphylococcus   . Sepsis (HCC) 11/13/2015  . Community acquired pneumonia 11/13/2015  . Pneumonia 11/13/2015  . Substance induced mood disorder (HCC) 11/13/2015  . Opiate withdrawal (HCC) 11/13/2015  . Opiate abuse, continuous (HCC) 11/13/2015  . Incarcerated femoral hernia left 04/28/2013    Past Surgical History:  Procedure Laterality Date  . INGUINAL HERNIA REPAIR Left 04/28/2013   Procedure: HERNIA REPAIR INGUINAL ADULT with mesh;  Surgeon: Cherylynn Ridges, MD;  Location: [facility] OR;  Service: General;  Laterality: Left;  . TEE WITHOUT CARDIOVERSION N/A 11/18/2015   Procedure: TRANSESOPHAGEAL ECHOCARDIOGRAM (TEE);  Surgeon: Iran Ouch, MD;  Location: [facility] ORS;  Service: Cardiovascular;  Laterality: N/A;  . TEE WITHOUT CARDIOVERSION N/A  12/31/2017   Procedure: TRANSESOPHAGEAL ECHOCARDIOGRAM (TEE);  Surgeon: Dalia Heading, MD;  Location: [facility] ORS;  Service: Cardiovascular;  Laterality: N/A;  . TEE WITHOUT CARDIOVERSION N/A 01/04/2018   Procedure: TRANSESOPHAGEAL ECHOCARDIOGRAM (TEE);  Surgeon: Dalia Heading, MD;  Location: [facility] ORS;  Service: Cardiovascular;  Laterality: N/A;  . TUMOR EXCISION     left scalpula    Allergies Patient has no known allergies.  Social History Social History   Tobacco Use  . Smoking status: Current Every Day Smoker    Types: Cigarettes  . Smokeless tobacco: Never Used  Substance Use Topics  . Alcohol use: No    Alcohol/week: 0.0 standard drinks  . Drug use: Yes    Comment: cocaine,Heroine   Review of Systems Constitutional: Positive for fever and chills Cardiovascular: Negative for chest pain. Respiratory: Negative for shortness of breath. Gastrointestinal: Negative for abdominal pain, vomiting and diarrhea. Musculoskeletal: Negative for back pain. Skin: Positive for diaphoresis Neurological: Negative for headaches, focal weakness or numbness.  All systems negative/normal/unremarkable except as stated in the HPI  ____________________________________________   PHYSICAL EXAM:  VITAL SIGNS: ED Triage Vitals  Enc Vitals Group     BP 12/28/17 0727 106/66     Pulse Rate 12/28/17 0727 (!) 107     Resp 12/28/17 0727 18     Temp 12/28/17 0727 98.6 F (37 C)     Temp Source 12/28/17 0727 Oral     SpO2 12/28/17 0727 100 %     Weight 12/28/17 0729 120 lb (54.4 kg)     Height 12/28/17 0729 5\' 2"  (  1.575 m)     Head Circumference --      Peak Flow --      Pain Score 12/28/17 0729 10     Pain Loc --      Pain Edu? --      Excl. in GC? --    Constitutional: Alert and oriented.  Mild distress Eyes: Conjunctivae are normal. Normal extraocular movements. ENT   Head: Normocephalic and atraumatic.   Nose: No congestion/rhinnorhea.   Mouth/Throat: Mucous membranes are  moist.   Neck: No stridor.  Severe pain with range of motion of the neck Cardiovascular: Normal rate, regular rhythm. No murmurs, rubs, or gallops. Respiratory: Normal respiratory effort without tachypnea nor retractions. Breath sounds are clear and equal bilaterally. No wheezes/rales/rhonchi. Gastrointestinal: Soft and nontender. Normal bowel sounds Musculoskeletal: Nontender with normal range of motion in extremities. No lower extremity tenderness nor edema. Neurologic:  Normal speech and language. No gross focal neurologic deficits are appreciated.  Skin:  Skin is warm, diaphoresis is noted Psychiatric: Mood and affect are normal. Speech and behavior are normal.  ____________________________________________  ED COURSE:  As part of my medical decision making, I reviewed the following data within the electronic MEDICAL RECORD NUMBER History obtained from family if available, nursing notes, old chart and ekg, as well as notes from prior ED visits. Patient presented for neck pain with fevers, chills and diaphoresis, we will assess with labs and imaging as indicated at this time. Clinical Course as of Jan 12 1502  Tue Dec 28, 2017  0102 DG Cervical Spine Complete [RS]    Clinical Course User Index [RS] Joni Reining, PA-C   Procedures ____________________________________________   LABS (pertinent positives/negatives)  Labs Reviewed  CULTURE, BLOOD (ROUTINE X 2) - Abnormal; Notable for the following components:      Result Value   Culture   (*)    Value: SERRATIA MARCESCENS SUSCEPTIBILITIES PERFORMED ON PREVIOUS CULTURE WITHIN THE LAST 5 DAYS. Performed at [facility], [address] [place], [place] [address]    All other components within normal limits  CULTURE, BLOOD (ROUTINE X 2) - Abnormal; Notable for the following components:   Culture SERRATIA MARCESCENS (*)    All other components within normal limits  MRSA PCR SCREENING - Abnormal; Notable for the following  components:   MRSA by PCR POSITIVE (*)    All other components within normal limits  BLOOD CULTURE ID PANEL (REFLEXED) - Abnormal; Notable for the following components:   Enterobacteriaceae species DETECTED (*)    Serratia marcescens DETECTED (*)    All other components within normal limits  CBC WITH DIFFERENTIAL/PLATELET - Abnormal; Notable for the following components:   WBC 19.0 (*)    Hemoglobin 11.3 (*)    HCT 34.0 (*)    RDW 15.2 (*)    Neutro Abs 15.9 (*)    Monocytes Absolute 1.4 (*)    All other components within normal limits  COMPREHENSIVE METABOLIC PANEL - Abnormal; Notable for the following components:   Potassium 3.4 (*)    Glucose, Bld 114 (*)    Calcium 8.7 (*)    All other components within normal limits  URINALYSIS, COMPLETE (UACMP) WITH MICROSCOPIC - Abnormal; Notable for the following components:   Color, Urine YELLOW (*)    APPearance CLEAR (*)    Hgb urine dipstick SMALL (*)    Bacteria, UA RARE (*)    All other components within normal limits  URINE DRUG SCREEN, QUALITATIVE ([facility] ONLY) -  Abnormal; Notable for the following components:   Cocaine Metabolite,Ur [place] POSITIVE (*)    Opiate, Ur Screen POSITIVE (*)    Benzodiazepine, Ur Scrn TEST NOT PERFORMED, REAGENT NOT AVAILABLE (*)    All other components within normal limits  SEDIMENTATION RATE - Abnormal; Notable for the following components:   Sed Rate 71 (*)    All other components within normal limits  BASIC METABOLIC PANEL - Abnormal; Notable for the following components:   CO2 19 (*)    Glucose, Bld 114 (*)    Calcium 8.1 (*)    All other components within normal limits  CBC - Abnormal; Notable for the following components:   WBC 14.7 (*)    RBC 3.56 (*)    Hemoglobin 9.9 (*)    HCT 28.6 (*)    RDW 15.6 (*)    All other components within normal limits  VANCOMYCIN, TROUGH - Abnormal; Notable for the following components:   Vancomycin Tr 6 (*)    All other components within normal limits  CBC  WITH DIFFERENTIAL/PLATELET - Abnormal; Notable for the following components:   WBC 14.3 (*)    Hemoglobin 10.2 (*)    HCT 31.1 (*)    RDW 15.5 (*)    Neutro Abs 12.0 (*)    All other components within normal limits  CBC - Abnormal; Notable for the following components:   WBC 12.7 (*)    RBC 3.36 (*)    Hemoglobin 9.0 (*)    HCT 27.1 (*)    RDW 15.5 (*)    All other components within normal limits  BASIC METABOLIC PANEL - Abnormal; Notable for the following components:   Sodium 132 (*)    Glucose, Bld 131 (*)    Calcium 8.1 (*)    All other components within normal limits  CBC - Abnormal; Notable for the following components:   WBC 16.4 (*)    RBC 3.74 (*)    Hemoglobin 10.3 (*)    HCT 30.2 (*)    RDW 15.5 (*)    All other components within normal limits  CBC - Abnormal; Notable for the following components:   WBC 19.9 (*)    Hemoglobin 10.6 (*)    HCT 31.6 (*)    RDW 15.4 (*)    Platelets 593 (*)    All other components within normal limits  BASIC METABOLIC PANEL - Abnormal; Notable for the following components:   Glucose, Bld 110 (*)    All other components within normal limits  CBC - Abnormal; Notable for the following components:   WBC 22.1 (*)    Hemoglobin 11.2 (*)    HCT 33.6 (*)    RDW 15.5 (*)    Platelets 670 (*)    All other components within normal limits  CBC - Abnormal; Notable for the following components:   WBC 13.0 (*)    RBC 3.38 (*)    Hemoglobin 9.1 (*)    HCT 27.3 (*)    RDW 15.2 (*)    Platelets 552 (*)    All other components within normal limits  SEDIMENTATION RATE - Abnormal; Notable for the following components:   Sed Rate 25 (*)    All other components within normal limits  CULTURE, BLOOD (ROUTINE X 2)  TROPONIN I  LACTIC ACID, PLASMA  CREATININE, SERUM  VANCOMYCIN, TROUGH  CREATININE, SERUM  CBC WITH DIFFERENTIAL/PLATELET    RADIOLOGY Images were viewed by me  IMPRESSION: Disc degeneration and spurring  at C5-6. Findings of  diskitis at C5-6 with endplate irregularity and mild bone marrow edema enhancement. There is ventral epidural fluid collection with surrounding epidural enhancement surrounding the cord. Given the clinical information appearance this is most consistent with discitis with epidural abscess. There is swelling of the cord with edema and mild spinal stenosis.  These results were called by telephone at the time of interpretation on 12/28/2017 at 11:16 am to Dr. Daryel November , who verbally acknowledged these results.   ____________________________________________  DIFFERENTIAL DIAGNOSIS   Epidural abscess, osteomyelitis, discitis  FINAL ASSESSMENT AND PLAN  Epidural abscess   Plan: The patient had presented for epidural abscess. Patient's labs are as dictated as above. Patient's imaging did reveal epidural abscess.  Patient will have an addendum dictated   Ulice Dash, MD   Note: This note was generated in part or whole with voice recognition software. Voice recognition is usually quite accurate but there are transcription errors that can and very often do occur. I apologize for any typographical errors that were not detected and corrected.     Emily Filbert, MD 01/12/18 [phone]

## 2018-02-01 NOTE — Discharge Summary (Signed)
[facility] - [place] at [facility]   PATIENT NAME: Andrea King    MR#:  [id number]  DATE OF BIRTH:  August 15, 1975  DATE OF ADMISSION:  12/28/2017 ADMITTING PHYSICIAN: Adrian Saran, MD  DATE OF DISCHARGE: 01/11/2018  6:00 PM  PRIMARY CARE PHYSICIAN: Alberteen Spindle, CNM   ADMISSION DIAGNOSIS:  Epidural abscess [G06.2]  DISCHARGE DIAGNOSIS:  Active Problems:   Epidural abscess   Malnutrition of moderate degree   SECONDARY DIAGNOSIS:   Past Medical History:  Diagnosis Date  . Anemia   . Asthma   . DJD (degenerative joint disease)   . Hepatitis C   . Substance abuse (HCC)      ADMITTING HISTORY  HISTORY OF PRESENT ILLNESS:  Andrea King  is a 42 y.o. female with a known history of  IVDA and tobacco abuse who presents to the ED complaining of neck pain x1 week.  Patient denies fever chills.  Patient rates pain 10 out of 10.  The pain radiates to her shoulders.  She reports she was a former IV drug user but no IV drug use in the last 2 years.  MRI was performed to the ER which shows discitis and epidural abscess.  Dr. Adriana Simas from her is her surgery saw the patient in consultation and at this point did not recommend transfer to another facility.  He recommended IV antibiotics and to discuss with IR if the epidural at this may be amenable to drainage.   HOSPITAL COURSE:   *C5-6 discitis and epidural abscess *History of IV drug use *Oral thrush *Chronic pain syndrome *non severe malnutrition of chronic illness  Patient was admitted to medical floor.  Abscess could not be drained by IR or neurosurgery.  Blood cultures grew Serratia.  Patient was continued on cefepime in the hospital.  Plan was to treat with 6 weeks of IV antibiotics.  Patient continued to spike fevers.  Work-up was underway.  Patient finally left AGAINST MEDICAL ADVICE.  She had history of leaving AGAINST MEDICAL ADVICE on previous admissions at [facility] with bacteremia.  I did give patient oral  antibiotics.  although this is not the ideal treatment for her epidural abscess this is the best we could do since patient is leaving AGAINST MEDICAL ADVICE.    CONSULTS OBTAINED:  Treatment Team:  Thana Farr, MD Lynn Ito, MD  DRUG ALLERGIES:  No Known Allergies  DISCHARGE MEDICATIONS:   Allergies as of 01/11/2018   No Known Allergies     Medication List    TAKE these medications   levofloxacin 750 MG tablet Commonly known as:  LEVAQUIN Take 1 tablet (750 mg total) by mouth daily.     ASK your doctor about these medications   acetaminophen 500 MG tablet Commonly known as:  TYLENOL Take 500-1,000 mg by mouth every 6 (six) hours as needed for mild pain or moderate pain.   ibuprofen 200 MG tablet Commonly known as:  ADVIL,MOTRIN Take 600 mg by mouth every 6 (six) hours as needed (for pain or headaches).       Today   VITAL SIGNS:  Blood pressure (!) 108/56, pulse 84, temperature 98.4 F (36.9 C), temperature source Oral, resp. rate 16, height 5\' 2"  (1.575 m), weight 52 kg, last menstrual period 12/20/2017, SpO2 96 %.  I/O:  No intake or output data in the 24 hours ending 02/01/18 2109  PHYSICAL EXAMINATION:  Physical Exam  GENERAL:  42 y.o.-year-old patient lying in the bed with no acute distress.  LUNGS: Normal breath sounds bilaterally, no wheezing, rales,rhonchi or crepitation. No use of accessory muscles of respiration.  CARDIOVASCULAR: S1, S2 normal. No murmurs, rubs, or gallops.  ABDOMEN: Soft, non-tender, non-distended. Bowel sounds present. No organomegaly or mass.  NEUROLOGIC: Moves all 4 extremities. PSYCHIATRIC: The patient is alert and oriented x 3.  SKIN: No obvious rash, lesion, or ulcer.   DATA REVIEW:   CBC No results for input(s): WBC, HGB, HCT, PLT in the last 168 hours.  Chemistries  No results for input(s): NA, K, CL, CO2, GLUCOSE, BUN, CREATININE, CALCIUM, MG, AST, ALT, ALKPHOS, BILITOT in the last 168  hours.  Invalid input(s): GFRCGP  Cardiac Enzymes No results for input(s): TROPONINI in the last 168 hours.  Microbiology Results  Results for orders placed or performed during the hospital encounter of 12/28/17  Blood culture (routine x 2)     Status: Abnormal   Collection Time: 12/28/17  9:13 AM  Result Value Ref Range Status   Specimen Description   Final    BLOOD RIGHT ARM Performed at [facility], [address], [place], [place] [address]    Special Requests   Final    NONE Performed at [facility], [address] [place], [place] [address]    Culture  Setup Time   Final    GRAM NEGATIVE RODS AEROBIC BOTTLE ONLY CRITICAL VALUE NOTED.  VALUE IS CONSISTENT WITH PREVIOUSLY REPORTED AND CALLED VALUE. JAG Performed at [facility], [address] [place], [place] [address]    Culture (A)  Final    SERRATIA MARCESCENS SUSCEPTIBILITIES PERFORMED ON PREVIOUS CULTURE WITHIN THE LAST 5 DAYS. Performed at [facility], [address], [place], [place] [address]    Report Status 01/01/2018 FINAL  Final  Blood culture (routine x 2)     Status: Abnormal   Collection Time: 12/28/17  9:13 AM  Result Value Ref Range Status   Specimen Description   Final    BLOOD Performed at [facility], [address], [place], [place] [address]    Special Requests   Final    NONE Performed at [facility], [address] [place], [place] [address]    Culture  Setup Time   Final    GRAM NEGATIVE RODS IN BOTH AEROBIC AND ANAEROBIC BOTTLES CRITICAL RESULT CALLED TO, READ BACK BY AND VERIFIED WITH: KAREN HAYES AT 1135 12/29/17 SDR Performed at [facility], [address] [place], [place] [address]    Culture SERRATIA MARCESCENS (A)  Final   Report Status 12/31/2017 FINAL  Final   Organism ID, Bacteria SERRATIA MARCESCENS  Final      Susceptibility   Serratia marcescens - MIC*    CEFAZOLIN >=64 RESISTANT Resistant     CEFEPIME  <=1 SENSITIVE Sensitive     CEFTAZIDIME <=1 SENSITIVE Sensitive     CEFTRIAXONE <=1 SENSITIVE Sensitive     CIPROFLOXACIN <=0.25 SENSITIVE Sensitive     GENTAMICIN <=1 SENSITIVE Sensitive     TRIMETH/SULFA <=20 SENSITIVE Sensitive     * SERRATIA MARCESCENS  Blood Culture ID Panel (Reflexed)     Status: Abnormal   Collection Time: 12/28/17  9:13 AM  Result Value Ref Range Status   Enterococcus species NOT DETECTED NOT DETECTED Final   Listeria monocytogenes NOT DETECTED NOT DETECTED Final   Staphylococcus species NOT DETECTED NOT DETECTED Final   Staphylococcus aureus NOT DETECTED NOT DETECTED Final   Streptococcus species NOT DETECTED NOT  DETECTED Final   Streptococcus agalactiae NOT DETECTED NOT DETECTED Final   Streptococcus pneumoniae NOT DETECTED NOT DETECTED Final   Streptococcus pyogenes NOT DETECTED NOT DETECTED Final   Acinetobacter baumannii NOT DETECTED NOT DETECTED Final   Enterobacteriaceae species DETECTED (A) NOT DETECTED Final    Comment: Enterobacteriaceae represent a large family of gram-negative bacteria, not a single organism. CRITICAL RESULT CALLED TO, READ BACK BY AND VERIFIED WITH:  KAREN HAYES AT 1135 12/29/17 SDR    Enterobacter cloacae complex NOT DETECTED NOT DETECTED Final   Escherichia coli NOT DETECTED NOT DETECTED Final   Klebsiella oxytoca NOT DETECTED NOT DETECTED Final   Klebsiella pneumoniae NOT DETECTED NOT DETECTED Final   Proteus species NOT DETECTED NOT DETECTED Final   Serratia marcescens DETECTED (A) NOT DETECTED Final    Comment: CRITICAL RESULT CALLED TO, READ BACK BY AND VERIFIED WITH:  KAREN HAYES AT 1135 12/29/17 SDR    Carbapenem resistance NOT DETECTED NOT DETECTED Final   Haemophilus influenzae NOT DETECTED NOT DETECTED Final   Neisseria meningitidis NOT DETECTED NOT DETECTED Final   Pseudomonas aeruginosa NOT DETECTED NOT DETECTED Final   Candida albicans NOT DETECTED NOT DETECTED Final   Candida glabrata NOT DETECTED NOT DETECTED  Final   Candida krusei NOT DETECTED NOT DETECTED Final   Candida parapsilosis NOT DETECTED NOT DETECTED Final   Candida tropicalis NOT DETECTED NOT DETECTED Final    Comment: Performed at [facility], [address] [place], [place] [address]  MRSA PCR Screening     Status: Abnormal   Collection Time: 12/28/17  2:36 PM  Result Value Ref Range Status   MRSA by PCR POSITIVE (A) NEGATIVE Final    Comment:        The GeneXpert MRSA Assay (FDA approved for NASAL specimens only), is one component of a comprehensive MRSA colonization surveillance program. It is not intended to diagnose MRSA infection nor to guide or monitor treatment for MRSA infections. RESULT CALLED TO, READ BACK BY AND VERIFIED WITH: BETH RICHARDSON AT 1610 ON 12/28/17 BY SNJ Performed at [facility], [address] [place], [place] [address]   CULTURE, BLOOD (ROUTINE X 2) w Reflex to ID Panel     Status: None   Collection Time: 12/31/17 12:29 AM  Result Value Ref Range Status   Specimen Description BLOOD RIGHT ARM  Final   Special Requests   Final    BOTTLES DRAWN AEROBIC AND ANAEROBIC Blood Culture adequate volume   Culture   Final    NO GROWTH 5 DAYS Performed at [facility], [address] [place], [place] [address]    Report Status 01/05/2018 FINAL  Final    RADIOLOGY:  No results found.  Follow up with PCP in 1 week.  Management plans discussed with the patient, family and they are in agreement.  CODE STATUS:  Code Status History    Date Active Date Inactive Code Status Order ID Comments User Context   12/28/2017 1331 01/11/2018 2106 Full Code [id number]  Adrian Saran, MD Inpatient   09/20/2017 2027 09/24/2017 1255 Full Code [id number]  Rometta Emery, MD ED   11/13/2015 0614 11/20/2015 1845 Full Code [id number]  Ihor Austin, MD ED   04/28/2013 0742 04/29/2013 1615 Full Code [id number]  Almond Lint, MD Inpatient      TOTAL TIME TAKING CARE OF THIS PATIENT ON DAY  OF DISCHARGE: more than 30 minutes.   Orie Fisherman M.D on 02/01/2018 at 9:09 PM  Between  7am to 6pm - Pager - [phone]  After 6pm go to [email or web address] - password [email or web address] [facility]  [facility] [place] [facility]  Office  [phone]  CC: Primary care physician; Alberteen Spindle, CNM  Note: This dictation was prepared with Dragon dictation along with smaller phrase technology. Any transcriptional errors that result from this process are unintentional.

## 2018-03-03 ENCOUNTER — Emergency Department: Payer: Self-pay

## 2018-03-03 ENCOUNTER — Other Ambulatory Visit: Payer: Self-pay

## 2018-03-03 ENCOUNTER — Inpatient Hospital Stay
Admission: EM | Admit: 2018-03-03 | Discharge: 2018-03-04 | DRG: 871 | Disposition: A | Discharge status: Other Acute Inpatient Hospital | Payer: Self-pay | Attending: Internal Medicine | Admitting: Internal Medicine

## 2018-03-03 DIAGNOSIS — B1920 Unspecified viral hepatitis C without hepatic coma: Secondary | ICD-10-CM | POA: Diagnosis present

## 2018-03-03 DIAGNOSIS — Z79899 Other long term (current) drug therapy: Secondary | ICD-10-CM

## 2018-03-03 DIAGNOSIS — G061 Intraspinal abscess and granuloma: Secondary | ICD-10-CM | POA: Diagnosis present

## 2018-03-03 DIAGNOSIS — J45909 Unspecified asthma, uncomplicated: Secondary | ICD-10-CM | POA: Diagnosis present

## 2018-03-03 DIAGNOSIS — Z9119 Patient's noncompliance with other medical treatment and regimen: Secondary | ICD-10-CM

## 2018-03-03 DIAGNOSIS — E871 Hypo-osmolality and hyponatremia: Secondary | ICD-10-CM | POA: Diagnosis present

## 2018-03-03 DIAGNOSIS — Z6820 Body mass index (BMI) 20.0-20.9, adult: Secondary | ICD-10-CM

## 2018-03-03 DIAGNOSIS — E43 Unspecified severe protein-calorie malnutrition: Secondary | ICD-10-CM | POA: Diagnosis present

## 2018-03-03 DIAGNOSIS — F17210 Nicotine dependence, cigarettes, uncomplicated: Secondary | ICD-10-CM | POA: Diagnosis present

## 2018-03-03 DIAGNOSIS — A4102 Sepsis due to Methicillin resistant Staphylococcus aureus: Principal | ICD-10-CM | POA: Diagnosis present

## 2018-03-03 DIAGNOSIS — R0602 Shortness of breath: Secondary | ICD-10-CM

## 2018-03-03 DIAGNOSIS — M542 Cervicalgia: Secondary | ICD-10-CM

## 2018-03-03 DIAGNOSIS — M4622 Osteomyelitis of vertebra, cervical region: Secondary | ICD-10-CM | POA: Diagnosis present

## 2018-03-03 DIAGNOSIS — N179 Acute kidney failure, unspecified: Secondary | ICD-10-CM | POA: Diagnosis present

## 2018-03-03 DIAGNOSIS — E876 Hypokalemia: Secondary | ICD-10-CM | POA: Diagnosis present

## 2018-03-03 DIAGNOSIS — Z86711 Personal history of pulmonary embolism: Secondary | ICD-10-CM

## 2018-03-03 DIAGNOSIS — G062 Extradural and subdural abscess, unspecified: Secondary | ICD-10-CM | POA: Diagnosis present

## 2018-03-03 DIAGNOSIS — F1110 Opioid abuse, uncomplicated: Secondary | ICD-10-CM | POA: Diagnosis present

## 2018-03-03 DIAGNOSIS — A419 Sepsis, unspecified organism: Secondary | ICD-10-CM

## 2018-03-03 DIAGNOSIS — M4642 Discitis, unspecified, cervical region: Secondary | ICD-10-CM | POA: Diagnosis present

## 2018-03-03 LAB — COMPREHENSIVE METABOLIC PANEL
ALT: 11 U/L (ref 0–44)
ANION GAP: 12 (ref 5–15)
AST: 26 U/L (ref 15–41)
Albumin: 2.6 g/dL — ABNORMAL LOW (ref 3.5–5.0)
Alkaline Phosphatase: 144 U/L — ABNORMAL HIGH (ref 38–126)
BUN: 37 mg/dL — ABNORMAL HIGH (ref 6–20)
CHLORIDE: 94 mmol/L — AB (ref 98–111)
CO2: 22 mmol/L (ref 22–32)
CREATININE: 1.31 mg/dL — AB (ref 0.44–1.00)
Calcium: 8.5 mg/dL — ABNORMAL LOW (ref 8.9–10.3)
GFR, EST AFRICAN AMERICAN: 56 mL/min — AB (ref >60)
GFR, EST NON AFRICAN AMERICAN: 49 mL/min — AB (ref >60)
Glucose, Bld: 161 mg/dL — ABNORMAL HIGH (ref 70–99)
Potassium: 3.1 mmol/L — ABNORMAL LOW (ref 3.5–5.1)
Sodium: 128 mmol/L — ABNORMAL LOW (ref 135–145)
Total Bilirubin: 0.5 mg/dL (ref 0.3–1.2)
Total Protein: 7.2 g/dL (ref 6.5–8.1)

## 2018-03-03 LAB — CBC WITH DIFFERENTIAL/PLATELET
Abs Immature Granulocytes: 1.52 10*3/uL — ABNORMAL HIGH (ref 0.00–0.07)
BASOS ABS: 0.2 10*3/uL — AB (ref 0.0–0.1)
Basophils Relative: 1 %
EOS ABS: 0.1 10*3/uL (ref 0.0–0.5)
EOS PCT: 0 %
HCT: 32.3 % — ABNORMAL LOW (ref 36.0–46.0)
HEMOGLOBIN: 10.9 g/dL — AB (ref 12.0–15.0)
Immature Granulocytes: 5 %
LYMPHS PCT: 4 %
Lymphs Abs: 1.1 10*3/uL (ref 0.7–4.0)
MCH: 25.5 pg — ABNORMAL LOW (ref 26.0–34.0)
MCHC: 33.7 g/dL (ref 30.0–36.0)
MCV: 75.6 fL — ABNORMAL LOW (ref 80.0–100.0)
Monocytes Absolute: 1.7 10*3/uL — ABNORMAL HIGH (ref 0.1–1.0)
Monocytes Relative: 6 %
NEUTROS PCT: 84 %
NRBC: 0 % (ref 0.0–0.2)
Neutro Abs: 23.7 10*3/uL — ABNORMAL HIGH (ref 1.7–7.7)
Platelets: 168 10*3/uL (ref 150–400)
RBC: 4.27 MIL/uL (ref 3.87–5.11)
RDW: 16.4 % — ABNORMAL HIGH (ref 11.5–15.5)
WBC: 28.3 10*3/uL — AB (ref 4.0–10.5)

## 2018-03-03 LAB — LACTIC ACID, PLASMA
LACTIC ACID, VENOUS: 2.3 mmol/L — AB (ref 0.5–1.9)
LACTIC ACID, VENOUS: 2 mmol/L — AB (ref 0.5–1.9)

## 2018-03-03 MED ORDER — SODIUM CHLORIDE 0.9 % IV BOLUS
1000.0000 mL | Freq: Once | INTRAVENOUS | Status: AC
  Administered 2018-03-03: 1000 mL via INTRAVENOUS

## 2018-03-03 MED ORDER — SODIUM CHLORIDE 0.9 % IV SOLN
2.0000 g | INTRAVENOUS | Status: DC
  Filled 2018-03-03: qty 2

## 2018-03-03 MED ORDER — FENTANYL CITRATE (PF) 100 MCG/2ML IJ SOLN
INTRAMUSCULAR | Status: AC
  Filled 2018-03-03: qty 2

## 2018-03-03 MED ORDER — FENTANYL CITRATE (PF) 100 MCG/2ML IJ SOLN
50.0000 ug | Freq: Once | INTRAMUSCULAR | Status: AC
  Administered 2018-03-03: 50 ug via INTRAVENOUS

## 2018-03-03 MED ORDER — GADOBUTROL 1 MMOL/ML IV SOLN
5.0000 mL | Freq: Once | INTRAVENOUS | Status: AC | PRN
  Administered 2018-03-03: 5 mL via INTRAVENOUS

## 2018-03-03 MED ORDER — SODIUM CHLORIDE 0.9 % IV SOLN
2.0000 g | Freq: Once | INTRAVENOUS | Status: AC
  Administered 2018-03-03: 2 g via INTRAVENOUS
  Filled 2018-03-03: qty 2

## 2018-03-03 MED ORDER — MORPHINE SULFATE (PF) 4 MG/ML IV SOLN
4.0000 mg | Freq: Once | INTRAVENOUS | Status: AC
  Administered 2018-03-03: 4 mg via INTRAVENOUS
  Filled 2018-03-03: qty 1

## 2018-03-03 MED ORDER — VANCOMYCIN HCL IN DEXTROSE 750-5 MG/150ML-% IV SOLN
750.0000 mg | INTRAVENOUS | Status: DC
  Administered 2018-03-04: 750 mg via INTRAVENOUS
  Filled 2018-03-03 (×2): qty 150

## 2018-03-03 MED ORDER — HYDROMORPHONE HCL 1 MG/ML IJ SOLN
INTRAMUSCULAR | Status: AC
  Administered 2018-03-03: 1 mg
  Filled 2018-03-03: qty 1

## 2018-03-03 MED ORDER — ONDANSETRON HCL 4 MG/2ML IJ SOLN
4.0000 mg | Freq: Once | INTRAMUSCULAR | Status: AC
  Administered 2018-03-03: 4 mg via INTRAVENOUS
  Filled 2018-03-03: qty 2

## 2018-03-03 MED ORDER — LORAZEPAM 2 MG/ML IJ SOLN
1.0000 mg | Freq: Once | INTRAMUSCULAR | Status: AC
  Administered 2018-03-03: 1 mg via INTRAVENOUS
  Filled 2018-03-03: qty 1

## 2018-03-03 MED ORDER — VANCOMYCIN HCL IN DEXTROSE 1-5 GM/200ML-% IV SOLN
1000.0000 mg | Freq: Once | INTRAVENOUS | Status: AC
  Administered 2018-03-03: 1000 mg via INTRAVENOUS
  Filled 2018-03-03: qty 200

## 2018-03-03 NOTE — ED Notes (Signed)
Date and time results received: 03/03/18 0650pm  Test: lactic acid Critical Value: 2.3  Name of Provider Notified: Siadecki  Orders Received? Or Actions Taken?: no new orders at this time

## 2018-03-03 NOTE — Consult Note (Signed)
Pharmacy Antibiotic Note  Andrea King is a 42 y.o. female admitted on 03/03/2018 with sepsis.  Pharmacy has been consulted for Cefepime and vancomycin dosing. Patient received cefepime 2g IV and Vancomycin 1g IV x 1 dose in ED.   Plan: Ke: 0.037   Vd: 34.3  T1/2: 18.73   Start Vancomycin 750 IV every 24 hours with 16 hour stack dosing.  Goal trough 15-20 mcg/mL. Calculated trough @ Css is 17.6. Trough level before 4th dose.   Start Cefepime 2g Iv every 24 hours.   Height: 5' (152.4 cm) Weight: 110 lb (49.9 kg) IBW/kg (Calculated) : 45.5  Temp (24hrs), Avg:98.2 F (36.8 C), Min:98.2 F (36.8 C), Max:98.2 F (36.8 C)  Recent Labs  Lab 03/03/18 1709 03/03/18 1812 03/03/18 1944  WBC 28.3*  --   --   CREATININE 1.31*  --   --   LATICACIDVEN  --  2.3* 2.0*    Estimated Creatinine Clearance: 39.4 mL/min (A) (by C-G formula based on SCr of 1.31 mg/dL (H)).    No Known Allergies  Antimicrobials this admission: 10/31 vancomycin >>  10/31 Cefepime >>   Dose adjustments this admission:   Microbiology results: 10/31 BCx: pending  Thank you for allowing pharmacy to be a part of this patient's care.  Gardner Candle, PharmD, BCPS Clinical Pharmacist 03/03/2018 9:46 PM

## 2018-03-03 NOTE — ED Notes (Signed)
Pt back from MRI moaning and rolling on the bed. BP is still low. Rechecked and systolic increased to 100. Dilaudid given. Pt states 2mg  will be needed and 1mg  wont do anything. Advised pt I don't give that amount of pain meds and her BP is so low I am afraid she will not tolerate. We will try the 1mg  and go from there. Pt told to call her aunt [name] and update her.

## 2018-03-03 NOTE — Progress Notes (Signed)
Responded to PIV consult. MD in room and was able to obtain access using [place].

## 2018-03-03 NOTE — ED Triage Notes (Signed)
A&O, in wheelchair. States was here 2-3 months ago for "infection in neck". C/o of neck and back pain. States they gave me some pills but only 14 of them. States she thinks she has pneumonia, states feels hard to breath. States she believes she has had fever, taken tylenol. "I have open heart surgery and it infected my lower spine." states that was 2017. Pt c/o of multiple complaints.

## 2018-03-03 NOTE — ED Notes (Signed)
PT ALSO STATES SHE HAS BED BUGS

## 2018-03-03 NOTE — ED Provider Notes (Addendum)
[facility] Emergency Department Provider Note ____________________________________________   First MD Initiated Contact with Patient 03/03/18 1719     (approximate)  I have reviewed the triage vital signs and the nursing notes.   HISTORY  Chief Complaint Back Pain and Neck Pain    HPI Andrea King is a 42 y.o. 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  The patient was admitted last month for epidural abscess in her neck.  She left AMA after a few weeks in the hospital.  She was intended to get 6 weeks of IV antibiotics but when she left AMA took oral antibiotics for a few weeks.  She states that the symptoms had improved somewhat but now have returned and are worse than they were before.  She also has some lower back pain which she was not having previously.  Past Medical History:  Diagnosis Date  . Anemia   . Asthma   . DJD (degenerative joint disease)   . Hepatitis C   . Substance abuse [facility])     Patient Active Problem List   Diagnosis Date Noted  . Malnutrition of moderate degree 12/29/2017  . 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 Palliative care by specialist   . Goals of care, counseling/discussion   . Periorbital cellulitis 09/20/2017  . Hypoglycemia 09/20/2017  . Bacteremia due to Staphylococcus   . Sepsis (HCC) 11/13/2015  . Community acquired pneumonia 11/13/2015  . Pneumonia 11/13/2015  . Substance induced mood disorder (HCC) 11/13/2015  . Opiate withdrawal (HCC) 11/13/2015  . Opiate abuse, continuous (HCC) 11/13/2015  . Incarcerated femoral hernia left 04/28/2013    Past Surgical History:  Procedure Laterality Date  . INGUINAL HERNIA REPAIR Left 04/28/2013     Procedure: HERNIA REPAIR INGUINAL ADULT with mesh;  Surgeon: Cherylynn Ridges, MD;  Location: [facility] OR;  Service: General;  Laterality: Left;  . TEE WITHOUT CARDIOVERSION N/A 11/18/2015   Procedure: TRANSESOPHAGEAL ECHOCARDIOGRAM (TEE);  Surgeon: Iran Ouch, MD;  Location: [facility] ORS;  Service: Cardiovascular;  Laterality: N/A;  . TEE WITHOUT CARDIOVERSION N/A 12/31/2017   Procedure: TRANSESOPHAGEAL ECHOCARDIOGRAM (TEE);  Surgeon: Dalia Heading, MD;  Location: [facility] ORS;  Service: Cardiovascular;  Laterality: N/A;  . TEE WITHOUT CARDIOVERSION N/A 01/04/2018   Procedure: TRANSESOPHAGEAL ECHOCARDIOGRAM (TEE);  Surgeon: Dalia Heading, MD;  Location: [facility] ORS;  Service: Cardiovascular;  Laterality: N/A;  . TUMOR EXCISION     left scalpula    Prior to Admission medications   Medication Sig Start Date End Date Taking? Authorizing Provider  acetaminophen (TYLENOL) 500 MG tablet Take 500-1,000 mg by mouth every 6 (six) hours as needed for mild pain or moderate pain.    [provider]  ibuprofen (ADVIL,MOTRIN) 200 MG tablet Take 600 mg by mouth every 6 (six) hours as needed (for pain or headaches).     [provider]  levofloxacin (LEVAQUIN) 750 MG tablet Take 1 tablet (750 mg total) by mouth daily. Patient not taking: Reported on 03/03/2018 01/11/18   Milagros Loll, MD    Allergies Patient has no known allergies.  Family History  Problem Relation Age of Onset  . Cancer Mother   . Cancer - Colon Maternal Grandfather   . Diabetes  Maternal Grandfather     Social History Social History   Tobacco Use  . Smoking status: Current Every Day Smoker    Types: Cigarettes  . Smokeless tobacco: Never Used  Substance Use Topics  . Alcohol use: No    Alcohol/week: 0.0 standard drinks  . Drug use: Yes    Comment: cocaine,Heroine    Review of Systems  Constitutional: Positive for fever. Eyes: No redness. ENT: Positive for neck pain. Cardiovascular: Denies chest  pain. Respiratory: Positive for shortness of breath. Gastrointestinal: Positive for nausea. Genitourinary: Negative for dysuria.  Musculoskeletal: Positive for back pain. Skin: Negative for rash. Neurological: Positive for focal weakness or numbness.   ____________________________________________   PHYSICAL EXAM:  VITAL SIGNS: ED Triage Vitals  Enc Vitals Group     BP 03/03/18 1706 (!) 97/54     Pulse Rate 03/03/18 1706 (!) 131     Resp 03/03/18 1706 18     Temp 03/03/18 1706 98.2 F (36.8 C)     Temp Source 03/03/18 1706 Oral     SpO2 03/03/18 1706 99 %     Weight 03/03/18 1707 110 lb (49.9 kg)     Height 03/03/18 1707 5' (1.524 m)     Head Circumference --      Peak Flow --      Pain Score --      Pain Loc --      Pain Edu? --      Excl. in GC? --     Constitutional: Alert and oriented.  Uncomfortable appearing and tearful but in no acute distress. Eyes: Conjunctivae are normal.  EOMI. Head: Atraumatic. Nose: No congestion/rhinnorhea. Mouth/Throat: Mucous membranes are dry.   Neck: Normal range of motion.  Mild midline cervical spinal tenderness with no step-off or crepitus. Cardiovascular: Tachycardic, regular rhythm. Grossly normal heart sounds.  Good peripheral circulation. Respiratory: Normal respiratory effort.  No retractions. Lungs CTAB. Gastrointestinal: Soft and nontender. No distention.  Genitourinary: No flank tenderness. Musculoskeletal: No lower extremity edema.  Extremities warm and well perfused.  Neurologic:  Normal speech and language.  Subjective decreased sensation to first and second digit of right hand, and 2 distal left leg.  Motor intact in all extremities. Skin:  Skin is warm and dry. No rash noted. Psychiatric: Anxious and tearful appearing.  ____________________________________________   LABS (all labs ordered are listed, but only abnormal results are displayed)  Labs Reviewed  LACTIC ACID, PLASMA - Abnormal; Notable for the following  components:      Result Value   Lactic Acid, Venous 2.0 (*)    All other components within normal limits  LACTIC ACID, PLASMA - Abnormal; Notable for the following components:   Lactic Acid, Venous 2.3 (*)    All other components within normal limits  COMPREHENSIVE METABOLIC PANEL - Abnormal; Notable for the following components:   Sodium 128 (*)    Potassium 3.1 (*)    Chloride 94 (*)    Glucose, Bld 161 (*)    BUN 37 (*)    Creatinine, Ser 1.31 (*)    Calcium 8.5 (*)    Albumin 2.6 (*)    Alkaline Phosphatase 144 (*)    GFR calc non Af Amer 49 (*)    GFR calc Af Amer 56 (*)    All other components within normal limits  CBC WITH DIFFERENTIAL/PLATELET - Abnormal; Notable for the following components:   WBC 28.3 (*)    Hemoglobin 10.9 (*)    HCT 32.3 (*)  MCV 75.6 (*)    MCH 25.5 (*)    RDW 16.4 (*)    Neutro Abs 23.7 (*)    Monocytes Absolute 1.7 (*)    Basophils Absolute 0.2 (*)    Abs Immature Granulocytes 1.52 (*)    All other components within normal limits  CULTURE, BLOOD (ROUTINE X 2)  CULTURE, BLOOD (ROUTINE X 2)  URINALYSIS, COMPLETE (UACMP) WITH MICROSCOPIC  POC URINE PREG, ED   ____________________________________________  EKG   ____________________________________________  RADIOLOGY  CXR: Possible multifocal opacity, nonspecific MR cervical spine: Pending MR thoracic spine: Pending MR lumbar spine: Pending  ____________________________________________   PROCEDURES  Procedure(s) performed: No  Procedures  Critical Care performed: No ____________________________________________   INITIAL IMPRESSION / ASSESSMENT AND PLAN / ED COURSE  Pertinent labs & imaging results that were available during my care of the patient were reviewed by me and considered in my medical decision making (see chart for details).  42 year old female with PMH as noted above including former history of IVDA and recent admission for epidural abscess presents with  recurring and persistent neck pain as well as low back pain, radicular symptoms in the right hand and left leg, subjective fevers, and generalized weakness.  I reviewed the past medical records in Epic; the patient was admitted in August and September of this year with a diagnosis of epidural abscess that was not amenable to drainage by IR or surgery.  She was receiving antibiotics and was planned to get 6 weeks of IV antibiotics but left AMA.  She states she did complete a course of oral antibiotics after leaving the hospital.  On exam today, the patient is tachycardic with a borderline blood pressure but otherwise normal vital signs.  The remainder of the exam is as described above.  She has subjective numbness in the right hand and left lower leg (excluding the foot) but no other neurologic deficits.  Overall I am concerned for recurrent or persistent epidural abscess.  The patient is now having pain in the lower back, so it may also be involving the thoracic and lumbar spine.  Differential primarily includes other source of infection such as UTI or pneumonia.  We will obtain labs including sepsis work-up, give fluids and analgesia, and obtain MRI of the spine including lumbar and thoracic.  ----------------------------------------- 8:49 PM on 03/03/2018 -----------------------------------------  The lab work-up is consistent with sepsis.  I started broad-spectrum antibiotics.  The patient is still pending MRI of the spine.  I discussed the case with the hospitalist.  The patient will need to be admitted, but disposition here versus tertiary center will be determined pending the results of the MRI and whether the patient has any potential surgical need.  I am signing the patient out to the oncoming physician Dr. Derrill Kay. ____________________________________________   FINAL CLINICAL IMPRESSION(S) / ED DIAGNOSES  Final diagnoses:  Sepsis, due to unspecified organism, unspecified whether acute  organ dysfunction present (HCC)  Neck pain      NEW MEDICATIONS STARTED DURING THIS VISIT:  New Prescriptions   No medications on file     Note:  This document was prepared using Dragon voice recognition software and may include unintentional dictation errors.      Dionne Bucy, MD 03/03/18 2051

## 2018-03-03 NOTE — ED Notes (Signed)
Pt states to MRI that she is claustrophobic and needs something for MRI. MD made aware.

## 2018-03-03 NOTE — ED Notes (Addendum)
Pt reports that she was in the hospital a few months ago for abscess on lower spine and that after bing here for over a week she signed herself out - she reports being given antibiotics that never "cleared it up" - she reports that for the last 7 days she has been in bed and has not eaten anything - she c/o lower back pain and numbness in left lower leg and right hand/fingers - she c/o shortness of breath but her respirations are even and unlabored - she reports that she could not take the pain anymore so she took Suboxone that was not prescribed to her - she states "I feel like I am dying" - pt reports that she called ems yesterday but refused transport because she did not want to come to [facility] but decided today that someone had to help her Pt reports fever of 103 but has not taken it with thermometer Pt is pale in color - skin warm and dry - is thin in appearance

## 2018-03-03 NOTE — ED Notes (Signed)
2nd set of cultures drawn and sent. 2nd lactic sent. Antibiotics started. Pt asking for pain medicine. Pt is laying in bed searching through her purse. Pt taken ice, food, OJ, cranberry juice, grape juice and apple juice along with 2 cans of cola at her request. TV channel changed as well. Told pt that she will either be admitted or transferred.

## 2018-03-03 NOTE — ED Notes (Signed)
Ativan given. Pt states that her pain is worse. Morphine and Fentanyl given. Advised pt that we may need to try medication for nerve pain. Pt states that dilaudid works the best. Advised pt that her BP is low and that is a concern.

## 2018-03-03 NOTE — ED Notes (Signed)
Pt to MRI

## 2018-03-03 NOTE — ED Notes (Signed)
Carollee Herter RN attempted IV x2 and ultrasound IV x1 without success - will place IV team consult

## 2018-03-04 ENCOUNTER — Other Ambulatory Visit: Payer: Self-pay

## 2018-03-04 ENCOUNTER — Inpatient Hospital Stay: Admit: 2018-03-04 | Payer: Self-pay

## 2018-03-04 LAB — URINALYSIS, COMPLETE (UACMP) WITH MICROSCOPIC
BILIRUBIN URINE: NEGATIVE
GLUCOSE, UA: NEGATIVE mg/dL
KETONES UR: NEGATIVE mg/dL
NITRITE: NEGATIVE
PH: 5 (ref 5.0–8.0)
Protein, ur: 30 mg/dL — AB
Specific Gravity, Urine: 1.016 (ref 1.005–1.030)

## 2018-03-04 LAB — BLOOD CULTURE ID PANEL (REFLEXED)
ACINETOBACTER BAUMANNII: NOT DETECTED
CANDIDA ALBICANS: NOT DETECTED
CANDIDA GLABRATA: NOT DETECTED
CANDIDA TROPICALIS: NOT DETECTED
Candida krusei: NOT DETECTED
Candida parapsilosis: NOT DETECTED
ENTEROBACTER CLOACAE COMPLEX: NOT DETECTED
ENTEROBACTERIACEAE SPECIES: NOT DETECTED
ENTEROCOCCUS SPECIES: NOT DETECTED
Escherichia coli: NOT DETECTED
HAEMOPHILUS INFLUENZAE: NOT DETECTED
Klebsiella oxytoca: NOT DETECTED
Klebsiella pneumoniae: NOT DETECTED
LISTERIA MONOCYTOGENES: NOT DETECTED
METHICILLIN RESISTANCE: DETECTED — AB
NEISSERIA MENINGITIDIS: NOT DETECTED
PSEUDOMONAS AERUGINOSA: NOT DETECTED
Proteus species: NOT DETECTED
STREPTOCOCCUS AGALACTIAE: NOT DETECTED
STREPTOCOCCUS PYOGENES: NOT DETECTED
STREPTOCOCCUS SPECIES: NOT DETECTED
Serratia marcescens: NOT DETECTED
Staphylococcus aureus (BCID): DETECTED — AB
Staphylococcus species: DETECTED — AB
Streptococcus pneumoniae: NOT DETECTED

## 2018-03-04 LAB — URINE DRUG SCREEN, QUALITATIVE (ARMC ONLY)
Amphetamines, Ur Screen: NOT DETECTED
BENZODIAZEPINE, UR SCRN: POSITIVE — AB
Barbiturates, Ur Screen: NOT DETECTED
CANNABINOID 50 NG, UR ~~LOC~~: NOT DETECTED
Cocaine Metabolite,Ur ~~LOC~~: POSITIVE — AB
MDMA (ECSTASY) UR SCREEN: NOT DETECTED
Methadone Scn, Ur: NOT DETECTED
Opiate, Ur Screen: POSITIVE — AB
Phencyclidine (PCP) Ur S: NOT DETECTED
TRICYCLIC, UR SCREEN: NOT DETECTED

## 2018-03-04 LAB — PHOSPHORUS: Phosphorus: 2 mg/dL — ABNORMAL LOW (ref 2.5–4.6)

## 2018-03-04 LAB — MAGNESIUM: MAGNESIUM: 2.4 mg/dL (ref 1.7–2.4)

## 2018-03-04 LAB — CBC
HCT: 27.9 % — ABNORMAL LOW (ref 36.0–46.0)
HEMOGLOBIN: 9.2 g/dL — AB (ref 12.0–15.0)
MCH: 25.4 pg — ABNORMAL LOW (ref 26.0–34.0)
MCHC: 33 g/dL (ref 30.0–36.0)
MCV: 77.1 fL — ABNORMAL LOW (ref 80.0–100.0)
Platelets: 210 10*3/uL (ref 150–400)
RBC: 3.62 MIL/uL — ABNORMAL LOW (ref 3.87–5.11)
RDW: 16.7 % — ABNORMAL HIGH (ref 11.5–15.5)
WBC: 32 10*3/uL — AB (ref 4.0–10.5)
nRBC: 0 % (ref 0.0–0.2)

## 2018-03-04 LAB — MRSA PCR SCREENING: MRSA BY PCR: POSITIVE — AB

## 2018-03-04 MED ORDER — SODIUM CHLORIDE 0.9 % IV SOLN
INTRAVENOUS | Status: DC
  Administered 2018-03-04 (×2): via INTRAVENOUS

## 2018-03-04 MED ORDER — HYDROMORPHONE HCL 1 MG/ML IJ SOLN
2.0000 mg | INTRAMUSCULAR | Status: DC | PRN
  Administered 2018-03-04 (×3): 2 mg via INTRAVENOUS
  Filled 2018-03-04 (×4): qty 2

## 2018-03-04 MED ORDER — CHLORHEXIDINE GLUCONATE CLOTH 2 % EX PADS
6.0000 | MEDICATED_PAD | Freq: Every day | CUTANEOUS | Status: DC
  Administered 2018-03-04: 6 via TOPICAL

## 2018-03-04 MED ORDER — LORAZEPAM 2 MG/ML IJ SOLN
2.0000 mg | Freq: Once | INTRAMUSCULAR | Status: AC
  Administered 2018-03-04: 2 mg via INTRAVENOUS
  Filled 2018-03-04: qty 1

## 2018-03-04 MED ORDER — ADULT MULTIVITAMIN W/MINERALS CH
1.0000 | ORAL_TABLET | Freq: Every day | ORAL | Status: DC
  Administered 2018-03-04: 1 via ORAL
  Filled 2018-03-04: qty 1

## 2018-03-04 MED ORDER — VANCOMYCIN HCL IN DEXTROSE 750-5 MG/150ML-% IV SOLN: 750.0000 mg | INTRAVENOUS | 0 refills | Status: DC

## 2018-03-04 MED ORDER — SODIUM CHLORIDE 0.9 % IV SOLN
2.0000 g | Freq: Three times a day (TID) | INTRAVENOUS | Status: DC
  Filled 2018-03-04 (×3): qty 2

## 2018-03-04 MED ORDER — SODIUM CHLORIDE 0.9 % IV SOLN: 2.0000 g | Freq: Two times a day (BID) | INTRAVENOUS | 0 refills | Status: DC

## 2018-03-04 MED ORDER — FENTANYL 50 MCG/HR TD PT72
50.0000 ug | MEDICATED_PATCH | TRANSDERMAL | Status: DC
  Administered 2018-03-04: 50 ug via TRANSDERMAL
  Filled 2018-03-04: qty 1

## 2018-03-04 MED ORDER — HALOPERIDOL LACTATE 5 MG/ML IJ SOLN
5.0000 mg | Freq: Once | INTRAMUSCULAR | Status: AC
  Administered 2018-03-04: 5 mg via INTRAVENOUS
  Filled 2018-03-04: qty 1

## 2018-03-04 MED ORDER — POTASSIUM PHOSPHATES 15 MMOLE/5ML IV SOLN
15.0000 mmol | Freq: Once | INTRAVENOUS | Status: AC
  Administered 2018-03-04: 15 mmol via INTRAVENOUS
  Filled 2018-03-04: qty 5

## 2018-03-04 MED ORDER — ONDANSETRON HCL 4 MG/2ML IJ SOLN: 4.0000 mg | Freq: Four times a day (QID) | INTRAMUSCULAR | Status: DC | PRN

## 2018-03-04 MED ORDER — MUPIROCIN 2 % EX OINT
1.0000 "application " | TOPICAL_OINTMENT | Freq: Two times a day (BID) | CUTANEOUS | Status: DC
  Administered 2018-03-04 (×2): 1 via NASAL
  Filled 2018-03-04: qty 22

## 2018-03-04 MED ORDER — HYDROMORPHONE HCL 1 MG/ML IJ SOLN: 1.0000 mg | INTRAMUSCULAR | Status: DC | PRN

## 2018-03-04 MED ORDER — SODIUM CHLORIDE 0.9 % IV SOLN
2.0000 g | Freq: Two times a day (BID) | INTRAVENOUS | Status: DC
  Filled 2018-03-04 (×2): qty 2

## 2018-03-04 MED ORDER — ONDANSETRON HCL 4 MG PO TABS: 4.0000 mg | ORAL_TABLET | Freq: Four times a day (QID) | ORAL | Status: DC | PRN

## 2018-03-04 MED ORDER — OXYCODONE HCL 5 MG PO TABS: 5.0000 mg | ORAL_TABLET | ORAL | Status: DC | PRN

## 2018-03-04 MED ORDER — HYDROMORPHONE HCL 1 MG/ML IJ SOLN
0.5000 mg | INTRAMUSCULAR | Status: DC | PRN
  Administered 2018-03-04: 0.5 mg via INTRAVENOUS
  Filled 2018-03-04: qty 0.5

## 2018-03-04 MED ORDER — ACETAMINOPHEN 325 MG PO TABS
650.0000 mg | ORAL_TABLET | Freq: Four times a day (QID) | ORAL | Status: DC | PRN
  Administered 2018-03-04: 650 mg via ORAL
  Filled 2018-03-04: qty 2

## 2018-03-04 MED ORDER — ACETAMINOPHEN 650 MG RE SUPP: 650.0000 mg | Freq: Four times a day (QID) | RECTAL | Status: DC | PRN

## 2018-03-04 MED ORDER — OXYCODONE HCL 5 MG PO TABS: 10.0000 mg | ORAL_TABLET | ORAL | Status: DC | PRN

## 2018-03-04 MED ORDER — ENOXAPARIN SODIUM 40 MG/0.4ML ~~LOC~~ SOLN
40.0000 mg | SUBCUTANEOUS | Status: DC
  Filled 2018-03-04: qty 0.4

## 2018-03-04 MED ORDER — SODIUM CHLORIDE 0.9 % IV SOLN
2.0000 g | Freq: Two times a day (BID) | INTRAVENOUS | Status: DC
  Administered 2018-03-04 (×2): 2 g via INTRAVENOUS
  Filled 2018-03-04 (×2): qty 2

## 2018-03-04 MED ORDER — OXYCODONE HCL 5 MG PO TABS
5.0000 mg | ORAL_TABLET | ORAL | Status: DC | PRN
  Administered 2018-03-04 (×3): 5 mg via ORAL
  Filled 2018-03-04 (×3): qty 1

## 2018-03-04 MED ORDER — ENSURE ENLIVE PO LIQD: 237.0000 mL | Freq: Two times a day (BID) | ORAL | Status: DC

## 2018-03-04 NOTE — Progress Notes (Signed)
Called Dr. Sheryle Hail regarding patient's elevated heart rate.  Appropriate orders were placed.  Arturo Morton  03/04/2018  5:41 AM

## 2018-03-04 NOTE — Progress Notes (Signed)
Patient ID: Andrea King, female   DOB: February 13, 1976, 42 y.o.   MRN: [id number] called [facility] transfer center to see if I can get in touch with neurosurgery. They are attempting to call neurosurgery. Have not heard back again patient's heart rate in the 130s. Blood pressure is so far stable. Will continue present management. Wait till [facility] neurosurgery calls me back

## 2018-03-04 NOTE — Progress Notes (Signed)
Initial Nutrition Assessment  DOCUMENTATION CODES:   Non-severe (moderate) malnutrition in context of chronic illness  INTERVENTION:   - Ensure Enlive po BID, each supplement provides 350 kcal and 20 grams of protein (vanilla flavor)  - MVI with minerals daily  NUTRITION DIAGNOSIS:   Moderate Malnutrition related to social / environmental circumstances (IVDU) as evidenced by moderate fat depletion, severe muscle depletion.  GOAL:   Patient will meet greater than or equal to 90% of their needs  MONITOR:   PO intake, Supplement acceptance, Weight trends, I & O's, Labs  REASON FOR ASSESSMENT:   Malnutrition Screening Tool    ASSESSMENT:   42 year old female who presented to the ED on 10/31 with neck and back pain. PMH significant for epidural abscess, IVDU, and anemia. Pt found to have sepsis. Pt was admitted a few months ago with bacteremia and last month with an epidural abscess to her neck where she left AMA. MRI showed persistent epidural abscess and discitis/osteomyelitis.  Pt with MRSA sepsis due to epidural abscess. Per MD note, pt may need CT guided epidural abscess drainage.  Spoke with pt briefly at bedside. Pt very lethargic and falling asleep throughout RD interview and did not answer all questions. Will attempt to obtain a more detailed diet and weight history at follow-up.  Pt endorses weight loss but was unable to provide more information. Pt also endorses poor appetite but is unable to provide a timeframe. When asked how many meals she eats daily, pt states, "not many."  Per weight history in chart, pt with weight loss of 1.4 kg over the past 2 months. This is a 2.7% weight loss which is not significant for timeframe.  Pt amenable to receiving vanilla Ensure Enlive during admission.  Medications reviewed and include: IV antibiotics, potassium phosphate once  Labs reviewed: sodium 128 (L), potassium 3.1 (L), BUN 37 (H), creatinine 1.31 (H)  NUTRITION -  FOCUSED PHYSICAL EXAM:    Most Recent Value  Orbital Region  Moderate depletion  Upper Arm Region  Moderate depletion  Thoracic and Lumbar Region  Moderate depletion  Buccal Region  Moderate depletion  Temple Region  Moderate depletion  Clavicle Bone Region  Severe depletion  Clavicle and Acromion Bone Region  Severe depletion  Scapular Bone Region  Moderate depletion  Dorsal Hand  Mild depletion  Patellar Region  Moderate depletion  Anterior Thigh Region  Severe depletion  Posterior Calf Region  Severe depletion  Edema (RD Assessment)  None  Hair  Reviewed  Eyes  Reviewed  Mouth  Reviewed  Skin  Reviewed  Nails  Reviewed      Diet Order:   Diet Order            Diet regular Room service appropriate? Yes; Fluid consistency: Thin  Diet effective now              EDUCATION NEEDS:   Not appropriate for education at this time  Skin:  Skin Assessment: Reviewed RN Assessment  Last BM:  unknown/PTA  Height:   Ht Readings from Last 1 Encounters:  03/04/18 5\' 2"  (1.575 m)    Weight:   Wt Readings from Last 1 Encounters:  03/04/18 50.6 kg    Ideal Body Weight:  50 kg  BMI:  Body mass index is 20.39 kg/m.  Estimated Nutritional Needs:   Kcal:  1550-1750  Protein:  75-85 grams  Fluid:  1.6-1.8 L    Earma Reading, MS, RD, LDN Inpatient Clinical Dietitian Pager: [phone]  Weekend/After Hours: [phone]

## 2018-03-04 NOTE — Progress Notes (Signed)
PHARMACY - PHYSICIAN COMMUNICATION CRITICAL VALUE ALERT - BLOOD CULTURE IDENTIFICATION (BCID)  Andrea King is an 42 y.o. female who presented to [facility] on 03/03/2018 with a chief complaint of   Assessment:  MRSA (include suspected source if known)  Name of physician (or Provider) Contacted: Sheryle Hail  Current antibiotics: vancomycin  Changes to prescribed antibiotics recommended:  n/a  Results for orders placed or performed during the hospital encounter of 03/03/18  Blood Culture ID Panel (Reflexed) (Collected: 03/03/2018  5:25 PM)  Result Value Ref Range   Enterococcus species NOT DETECTED NOT DETECTED   Listeria monocytogenes NOT DETECTED NOT DETECTED   Staphylococcus species DETECTED (A) NOT DETECTED   Staphylococcus aureus (BCID) DETECTED (A) NOT DETECTED   Methicillin resistance DETECTED (A) NOT DETECTED   Streptococcus species NOT DETECTED NOT DETECTED   Streptococcus agalactiae NOT DETECTED NOT DETECTED   Streptococcus pneumoniae NOT DETECTED NOT DETECTED   Streptococcus pyogenes NOT DETECTED NOT DETECTED   Acinetobacter baumannii NOT DETECTED NOT DETECTED   Enterobacteriaceae species NOT DETECTED NOT DETECTED   Enterobacter cloacae complex NOT DETECTED NOT DETECTED   Escherichia coli NOT DETECTED NOT DETECTED   Klebsiella oxytoca NOT DETECTED NOT DETECTED   Klebsiella pneumoniae NOT DETECTED NOT DETECTED   Proteus species NOT DETECTED NOT DETECTED   Serratia marcescens NOT DETECTED NOT DETECTED   Haemophilus influenzae NOT DETECTED NOT DETECTED   Neisseria meningitidis NOT DETECTED NOT DETECTED   Pseudomonas aeruginosa NOT DETECTED NOT DETECTED   Candida albicans NOT DETECTED NOT DETECTED   Candida glabrata NOT DETECTED NOT DETECTED   Candida krusei NOT DETECTED NOT DETECTED   Candida parapsilosis NOT DETECTED NOT DETECTED   Candida tropicalis NOT DETECTED NOT DETECTED    [name] S 03/04/2018  5:09 AM

## 2018-03-04 NOTE — Consult Note (Signed)
NAME: Andrea King  DOB: [birth year]  MRN: [id number]  Date/Time: 03/04/2018 4:52 PM  Andrea King Subjective:  REASON FOR CONSULT: MRSA sepsis ?pt in distress, no history from her. Chart reviewed  Andrea King is a 42 y.o. 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 In the ED temp of 100.4, HR 131, 97/54 Wbc of 28.3, Blood culture sent and started on vanco and cefepime.   She had an MRI of the cervical spine which showed Changes consistent with progressive osteomyelitis discitis with loss of C5-6 interspace as well as C5 and C6 vertebral body height loss. Associated 4 mm retrolisthesis of C5 on C6 with focal kyphotic angulation. Superimposed circumferential epidural enhancement at this level. There is impingement of the cervical spinal cord with fairly severe spinal stenosis. Thecal sac measures AP diameter at its most narrow point. Moderate bilateral C6 foraminal narrowing. Urine tox screen positive for cocaine , benzo and opiates. Pt was in [facility] 8/27-9/10 for serratia bacteremia and cervical discitis. During that hospitalization there was concern about medication misuse and a  telesitter was place din her room. Pt did not want that and left AMA.   on 01/11/18 . She was given levaquin prescription and asked to seek medical care as OP.She did not get the medication until 02/02/18.  Pt says she is in pain and does not want to talk.   Past Medical History:  Diagnosis Date  . Anemia   . Asthma   . DJD  (degenerative joint disease)   . Hepatitis C   . Substance abuse [facility])     Past Surgical History:  Procedure Laterality Date  . INGUINAL HERNIA REPAIR Left 04/28/2013   Procedure: HERNIA REPAIR INGUINAL ADULT with mesh;  Surgeon: Cherylynn Ridges, MD;  Location: [facility] OR;  Service: General;  Laterality: Left;  . TEE WITHOUT CARDIOVERSION N/A 11/18/2015   Procedure: TRANSESOPHAGEAL ECHOCARDIOGRAM (TEE);  Surgeon: Iran Ouch, MD;  Location: [facility] ORS;  Service: Cardiovascular;  Laterality: N/A;  . TEE WITHOUT CARDIOVERSION N/A 12/31/2017   Procedure: TRANSESOPHAGEAL ECHOCARDIOGRAM (TEE);  Surgeon: Dalia Heading, MD;  Location: [facility] ORS;  Service: Cardiovascular;  Laterality: N/A;  . TEE WITHOUT CARDIOVERSION N/A 01/04/2018   Procedure: TRANSESOPHAGEAL ECHOCARDIOGRAM (TEE);  Surgeon: Dalia Heading, MD;  Location: [facility] ORS;  Service: Cardiovascular;  Laterality: N/A;  . TUMOR EXCISION     left scalpula    Social History   Socioeconomic History  . Marital status: Single    Spouse name: Not on file  . Number of children: Not on file  . Years of education: Not on file  . Highest education level: Not on file  Occupational History  . Occupation: unemployed  Social Needs  . Financial resource strain: Not on file  . Food insecurity:    Worry: Not on file    Inability: Not on file  . Transportation needs:    Medical: Not on file    Non-medical: Not on  file  Tobacco Use  . Smoking status: Current Every Day Smoker    Types: Cigarettes  . Smokeless tobacco: Never Used  Substance and Sexual Activity  . Alcohol use: No    Alcohol/week: 0.0 standard drinks  . Drug use: Yes    Comment: cocaine,Heroine  . Sexual activity: Yes    Birth control/protection: None  Lifestyle  . Physical activity:    Days per week: Not on file    Minutes per session: Not on file  . Stress: Not on file  Relationships  . Social connections:    Talks on phone: Not on file    Gets together: Not on file     Attends religious service: Not on file    Active member of club or organization: Not on file    Attends meetings of clubs or organizations: Not on file    Relationship status: Not on file  . Intimate partner violence:    Fear of current or ex partner: Not on file    Emotionally abused: Not on file    Physically abused: Not on file    Forced sexual activity: Not on file  Other Topics Concern  . Not on file  Social History Narrative  . Not on file    Family History  Problem Relation Age of Onset  . Cancer Mother   . Cancer - Colon Maternal Grandfather   . Diabetes Maternal Grandfather    No Known Allergies ? Current Facility-Administered Medications  Medication Dose Route Frequency Provider Last Rate Last Dose  . 0.9 %  sodium chloride infusion   Intravenous Continuous [name], MD   Stopped at 03/04/18 1200  . acetaminophen (TYLENOL) tablet 650 mg  650 mg Oral Q6H PRN [name], MD   650 mg at 03/04/18 0259   Or  . acetaminophen (TYLENOL) suppository 650 mg  650 mg Rectal Q6H PRN [name], MD      . ceFEPIme (MAXIPIME) 2 g in sodium chloride 0.9 % 100 mL IVPB  2 g Intravenous Q12H Oralia Manis, MD   Stopped at 03/04/18 1028  . Chlorhexidine Gluconate Cloth 2 % PADS 6 each  6 each Topical Q0600 [name], MD   6 each at 03/04/18 0913  . enoxaparin (LOVENOX) injection 40 mg  40 mg Subcutaneous Q24H Oralia Manis, MD      . feeding supplement (ENSURE ENLIVE) (ENSURE ENLIVE) liquid 237 mL  237 mL Oral BID BM [name], MD      . fentaNYL (DURAGESIC - dosed mcg/hr) 50 mcg  50 mcg Transdermal Q72H [name], MD   50 mcg at 03/04/18 1401  . HYDROmorphone (DILAUDID) injection 2 mg  2 mg Intravenous Q4H PRN [name], MD   2 mg at 03/04/18 1426  . multivitamin with minerals tablet 1 tablet  1 tablet Oral Daily [name], MD   1 tablet at 03/04/18 1401  . mupirocin ointment (BACTROBAN) 2 % 1 application  1 application Nasal BID [name], MD   1 application at  03/04/18 0913  . ondansetron (ZOFRAN) tablet 4 mg  4 mg Oral Q6H PRN Oralia Manis, MD       Or  . ondansetron [facility]) injection 4 mg  4 mg Intravenous Q6H PRN Oralia Manis, MD      . oxyCODONE (Oxy IR/ROXICODONE) immediate release tablet 5 mg  5 mg Oral Q4H PRN [name], MD   5 mg at 03/04/18 1049  . potassium PHOSPHATE 15 mmol in  dextrose 5 % 250 mL infusion  15 mmol Intravenous Once [name], MD 43 mL/hr at 03/04/18 1410 15 mmol at 03/04/18 1410  . vancomycin (VANCOCIN) IVPB 750 mg/150 ml premix  750 mg Intravenous Q24H Oralia Manis, MD 150 mL/hr at 03/04/18 1404 750 mg at 03/04/18 1404     Abtx:  Anti-infectives (From admission, onward)   Start     Dose/Rate Route Frequency Ordered Stop   03/04/18 1800  ceFEPIme (MAXIPIME) 2 g in sodium chloride 0.9 % 100 mL IVPB  Status:  Discontinued     2 g 200 mL/hr over 30 Minutes Intravenous Every 24 hours 03/03/18 2150 03/04/18 0802   03/04/18 1200  vancomycin (VANCOCIN) IVPB 750 mg/150 ml premix     750 mg 150 mL/hr over 60 Minutes Intravenous Every 24 hours 03/03/18 2150     03/04/18 1000  ceFEPIme (MAXIPIME) 2 g in sodium chloride 0.9 % 100 mL IVPB  Status:  Discontinued     2 g 200 mL/hr over 30 Minutes Intravenous Every 12 hours 03/04/18 0802 03/04/18 0851   03/04/18 1000  ceFEPIme (MAXIPIME) 2 g in sodium chloride 0.9 % 100 mL IVPB  Status:  Discontinued     2 g 200 mL/hr over 30 Minutes Intravenous Every 8 hours 03/04/18 0851 03/04/18 0852   03/04/18 1000  ceFEPIme (MAXIPIME) 2 g in sodium chloride 0.9 % 100 mL IVPB     2 g 200 mL/hr over 30 Minutes Intravenous Every 12 hours 03/04/18 0852     03/03/18 1915  ceFEPIme (MAXIPIME) 2 g in sodium chloride 0.9 % 100 mL IVPB     2 g 200 mL/hr over 30 Minutes Intravenous  Once 03/03/18 1914 03/03/18 2025   03/03/18 1915  vancomycin (VANCOCIN) IVPB 1000 mg/200 mL premix     1,000 mg 200 mL/hr over 60 Minutes Intravenous  Once 03/03/18 1914 03/03/18 2138      REVIEW OF SYSTEMS:    NA Objective:  VITALS:  BP (!) 111/55 (BP Location: Right Arm)   Pulse (!) 136   Temp 98.6 F (37 C) (Oral)   Resp (!) 28   Ht 5\' 2"  (1.575 m)   Wt 50.6 kg   SpO2 95%   BMI 20.39 kg/m  PHYSICAL EXAM: examination limited General: awake, in distress secondary to pain all over, does not want to talk  Head: Normocephalic, without obvious abnormality, atraumatic. Eyes:did not examine ENT Nares normal. No drainage or sinus tenderness. Lips, No Thrush Neck: did not check for neck stiffness, or mobility because of cervical vertebral infection Back: No decub Lungs: b/l air entry Heart: s1s2 tachycardia Abdomen: Soft, non-tender,not distended. Bowel sounds normal. No masses Extremities: atraumatic, no cyanosis. No edema. No clubbing Skin: No rashes or lesions. Or bruising Lymph: Cervical, supraclavicular normal. Neurologic: did not examine in detail, moves all limbs Pertinent Labs Lab Results CBC    Component Value Date/Time   WBC 32.0 (H) 03/04/2018 1017   RBC 3.62 (L) 03/04/2018 1017   HGB 9.2 (L) 03/04/2018 1017   HCT 27.9 (L) 03/04/2018 1017   PLT 210 03/04/2018 1017   MCV 77.1 (L) 03/04/2018 1017   MCH 25.4 (L) 03/04/2018 1017   MCHC 33.0 03/04/2018 1017   RDW 16.7 (H) 03/04/2018 1017   LYMPHSABS 1.1 03/03/2018 1709   MONOABS 1.7 (H) 03/03/2018 1709   EOSABS 0.1 03/03/2018 1709   BASOSABS 0.2 (H) 03/03/2018 1709    CMP Latest Ref Rng & Units 03/03/2018 01/11/2018 01/04/2018  Glucose  70 - 99 mg/dL 308(M) - 578(I)  BUN 6 - 20 mg/dL 69(G) - 15  Creatinine 0.44 - 1.00 mg/dL 2.95(M) 8.41 3.24  Sodium 135 - 145 mmol/L 128(L) - 138  Potassium 3.5 - 5.1 mmol/L 3.1(L) - 4.6  Chloride 98 - 111 mmol/L 94(L) - 101  CO2 22 - 32 mmol/L 22 - 30  Calcium 8.9 - 10.3 mg/dL 4.0(N) - 9.5  Total Protein 6.5 - 8.1 g/dL 7.2 - -  Total Bilirubin 0.3 - 1.2 mg/dL 0.5 - -  Alkaline Phos 38 - 126 U/L 144(H) - -  AST 15 - 41 U/L 26 - -  ALT 0 - 44 U/L 11 - -      Microbiology: Recent  Results (from the past 240 hour(s))  Culture, blood (routine x 2)     Status: None (Preliminary result)   Collection Time: 03/03/18  5:25 PM  Result Value Ref Range Status   Specimen Description   Final    BLOOD LEFT ARM Performed at [facility], [address], [place], [place] [address]    Special Requests   Final    BOTTLES DRAWN AEROBIC AND ANAEROBIC Blood Culture results may not be optimal due to an excessive volume of blood received in culture bottles Performed at [facility], [address] [place], [place] [address]    Culture  Setup Time   Final    GRAM POSITIVE COCCI IN BOTH AEROBIC AND ANAEROBIC BOTTLES CRITICAL RESULT CALLED TO, READ BACK BY AND VERIFIED WITH: MNATT MCBANE @0430  03/04/18 AKT Performed at [facility], [address], [place], [place] [address]    Culture GRAM POSITIVE COCCI  Final   Report Status PENDING  Incomplete  Blood Culture ID Panel (Reflexed)     Status: Abnormal   Collection Time: 03/03/18  5:25 PM  Result Value Ref Range Status   Enterococcus species NOT DETECTED NOT DETECTED Final   Listeria monocytogenes NOT DETECTED NOT DETECTED Final   Staphylococcus species DETECTED (A) NOT DETECTED Final    Comment: CRITICAL RESULT CALLED TO, READ BACK BY AND VERIFIED WITH: MATT MCBANE @0430  03/04/18 AKT    Staphylococcus aureus (BCID) DETECTED (A) NOT DETECTED Final    Comment: Methicillin (oxacillin)-resistant Staphylococcus aureus (MRSA). MRSA is predictably resistant to beta-lactam antibiotics (except ceftaroline). Preferred therapy is vancomycin unless clinically contraindicated. Patient requires contact precautions if  hospitalized. CRITICAL RESULT CALLED TO, READ BACK BY AND VERIFIED WITH: MATT MCBANE @0430  03/04/18 AKT    Methicillin resistance DETECTED (A) NOT DETECTED Final    Comment: CRITICAL RESULT CALLED TO, READ BACK BY AND VERIFIED WITH: MATT MCBANE @0430  03/04/18 AKT    Streptococcus species NOT DETECTED NOT  DETECTED Final   Streptococcus agalactiae NOT DETECTED NOT DETECTED Final   Streptococcus pneumoniae NOT DETECTED NOT DETECTED Final   Streptococcus pyogenes NOT DETECTED NOT DETECTED Final   Acinetobacter baumannii NOT DETECTED NOT DETECTED Final   Enterobacteriaceae species NOT DETECTED NOT DETECTED Final   Enterobacter cloacae complex NOT DETECTED NOT DETECTED Final   Escherichia coli NOT DETECTED NOT DETECTED Final   Klebsiella oxytoca NOT DETECTED NOT DETECTED Final   Klebsiella pneumoniae NOT DETECTED NOT DETECTED Final   Proteus species NOT DETECTED NOT DETECTED Final   Serratia marcescens NOT DETECTED NOT DETECTED Final   Haemophilus influenzae NOT DETECTED NOT DETECTED Final   Neisseria meningitidis NOT DETECTED NOT DETECTED Final   Pseudomonas aeruginosa NOT DETECTED NOT DETECTED Final   Candida albicans NOT DETECTED  NOT DETECTED Final   Candida glabrata NOT DETECTED NOT DETECTED Final   Candida krusei NOT DETECTED NOT DETECTED Final   Candida parapsilosis NOT DETECTED NOT DETECTED Final   Candida tropicalis NOT DETECTED NOT DETECTED Final    Comment: Performed at [facility], [address] [place], [place] [address]  Culture, blood (routine x 2)     Status: None (Preliminary result)   Collection Time: 03/03/18  7:44 PM  Result Value Ref Range Status   Specimen Description   Final    BLOOD LEFT ARM Performed at [facility], [address], [place], [place] [address]    Special Requests   Final    BOTTLES DRAWN AEROBIC AND ANAEROBIC Blood Culture adequate volume Performed at [facility], [address], [place], [place] [address]    Culture  Setup Time   Final    GRAM POSITIVE COCCI IN BOTH AEROBIC AND ANAEROBIC BOTTLES Performed at [facility], [address], [place], [place] [address]    Culture GRAM POSITIVE COCCI  Final   Report Status PENDING  Incomplete  MRSA PCR Screening     Status: Abnormal   Collection Time:  03/04/18  3:02 AM  Result Value Ref Range Status   MRSA by PCR POSITIVE (A) NEGATIVE Final    Comment:        The GeneXpert MRSA Assay (FDA approved for NASAL specimens only), is one component of a comprehensive MRSA colonization surveillance program. It is not intended to diagnose MRSA infection nor to guide or monitor treatment for MRSA infections. RESULT CALLED TO, READ BACK BY AND VERIFIED WITH:  Jerrell Mylar AT [id number] 03/04/18 SDR Performed at [facility], [address] [place], [place] [address]    IMAGING RESULTS: ? Impression/Recommendation ? 42 y.o. 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 Likely due to IVDA- nodularity in the lungs suggestive of septic emboli and tricuspid endocarditis. Will need TEE  Repeat blood culture until clear of bacteria  Extensive cervical spine/thoracic spine infection : Concern for cervical spine stenosis  Progressive findings of osteomyelitis discitis at C5-6 with progressive disc space and vertebral body height loss, with progressive 4 mm retrolisthesis of C5 on C6, resulting in focal kyphotic angulation of the cervical spine at this level. Secondary impingement upon the cervical spinal cord at this level with resultant severe spinal stenosis. Neuro surgical consultation recommended. 2. Extensive paraspinous phlegmon and infection with superimposed abscesses involving the central and left posterior paraspinous musculature  extending from C2-3 through T4-5 as above.? Seen by neurosurgery- is being transferred to [facility]  On vanco and cefepime  Recent serratia bacteremia- incompletely treated as she left AMA Recent cervical discitis  H/O MRSA bacteremia/septic emboli in 2017   H/o Streptococcus sanguis bacteremia/ tricuspid endocarditis  Tricuspid valve repair  Non compliance and non adherence  IVDA  HEPC    ___________________________________________________ Discussed with requesting provider

## 2018-03-04 NOTE — Discharge Summary (Signed)
[facility] [place] at [facility]   PATIENT NAME: Barby Colvard    MR#:  [id number]  DATE OF BIRTH:  Dec 16, 1975  DATE OF ADMISSION:  03/03/2018 ADMITTING PHYSICIAN: Oralia Manis, MD  DATE OF DISCHARGE: 03/04/2018  PRIMARY CARE PHYSICIAN: Alberteen Spindle, CNM    ADMISSION DIAGNOSIS:  Shortness of breath [R06.02] Neck pain [M54.2] Sepsis, due to unspecified organism, unspecified whether acute organ dysfunction present (HCC) [A41.9]  DISCHARGE DIAGNOSIS:  MRSA sepsis Cervical and upper thoracic paraspinous/epidural abscess/ osteomyelitis tachycardia SECONDARY DIAGNOSIS:   Past Medical History:  Diagnosis Date  . Anemia   . Asthma   . DJD (degenerative joint disease)   . Hepatitis C   . Substance abuse [facility])     HOSPITAL COURSE:  RainaTroxleris a42 y.o.femalewho presents with chief complaint as above. Patient presents with worsening symptoms of subjective fever, tingling and numbness in her right hand and left leg. She was admitted here a few months ago with bacteremia, and then last month with an epidural abscess in her neck. She left AMA after few weeks in the hospital.   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 BC ID shows M RSA -IV vancomycin and cefepime -ID consultation placed -transthoracic echocardiogram ordered -Will need TEE -patient has history of tricuspid valvular plasty in 2017 at [facility]. Patient left AMA at that time  And did not finish antibiotics. She has BC postive for strept sanginous -she left AMA on 9/10/ 2019--- blood cultures were positive for Serratia. She was getting treated for cervical discitis  *Drug abuse-- IV -present tells me she is not done IV drug use since her September admission -urine drug screen pending  *Severe neck pain secondary to epidural abscess cervical and thoracic -seen by neurosurgery  PA--- patient will need to transfer to [facility] for possible surgery . This was  discussed with Dr. Marcell Barlow -PRN IV Dilaudid, oral oxycodone, fentanyl patch added  *Hyponatremia, hypokalemia, acute renal failure secondary to poor PO intake -IV fluids  *Malnutrition severe in the context of chronic infection -dietitian consultation  *Leukocytosis/tachycardia  due to epidural abscess  Pt's aunt [name] is aware   CONSULTS OBTAINED:  Treatment Team:  Venetia Night, MD Lynn Ito, MD  DRUG ALLERGIES:  No Known Allergies  DISCHARGE MEDICATIONS:   Allergies as of 03/04/2018   No Known Allergies     Medication List    STOP taking these medications   ibuprofen 200 MG tablet Commonly known as:  ADVIL,MOTRIN   levofloxacin 750 MG tablet Commonly known as:  LEVAQUIN     TAKE these medications   acetaminophen 500 MG tablet Commonly known as:  TYLENOL Take 500-1,000 mg by mouth every 6 (six) hours as needed for mild pain or moderate pain.   ceFEPIme 2 g in sodium chloride 0.9 % 100 mL Inject 2 g into the vein every 12 (twelve) hours.   Vancomycin 750-5 MG/150ML-% Soln Commonly known as:  VANCOCIN Inject 150 mLs (750 mg total) into the vein daily. Start taking on:  03/05/2018       If you experience worsening of your admission symptoms, develop shortness of breath, life threatening emergency, suicidal or homicidal thoughts you must seek medical attention immediately by calling 911 or calling your MD immediately  if symptoms less severe.  You Must read complete instructions/literature along with all the possible adverse reactions/side effects for all the Medicines you take and that  have been prescribed to you. Take any new Medicines after you have completely understood and accept all the possible adverse reactions/side effects.   Please note  You were cared for by a hospitalist during your hospital stay. If you have any questions about your  discharge medications or the care you received while you were in the hospital after you are discharged, you can call the unit and asked to speak with the hospitalist on call if the hospitalist that took care of you is not available. Once you are discharged, your primary care physician will handle any further medical issues. Please note that NO REFILLS for any discharge medications will be authorized once you are discharged, as it is imperative that you return to your primary care physician (or establish a relationship with a primary care physician if you do not have one) for your aftercare needs so that they can reassess your need for medications and monitor your lab values.   DATA REVIEW:   CBC  Recent Labs  Lab 03/04/18 1017  WBC 32.0*  HGB 9.2*  HCT 27.9*  PLT 210    Chemistries  Recent Labs  Lab 03/03/18 1709 03/04/18 1017  NA 128*  --   K 3.1*  --   CL 94*  --   CO2 22  --   GLUCOSE 161*  --   BUN 37*  --   CREATININE 1.31*  --   CALCIUM 8.5*  --   MG  --  2.4  AST 26  --   ALT 11  --   ALKPHOS 144*  --   BILITOT 0.5  --     Microbiology Results   Recent Results (from the past 240 hour(s))  Culture, blood (routine x 2)     Status: None (Preliminary result)   Collection Time: 03/03/18  5:25 PM  Result Value Ref Range Status   Specimen Description   Final    BLOOD LEFT ARM Performed at [facility], [address], [place], [place] [address]    Special Requests   Final    BOTTLES DRAWN AEROBIC AND ANAEROBIC Blood Culture results may not be optimal due to an excessive volume of blood received in culture bottles Performed at [facility], [address] [place], [place] [address]    Culture  Setup Time   Final    GRAM POSITIVE COCCI IN BOTH AEROBIC AND ANAEROBIC BOTTLES CRITICAL RESULT CALLED TO, READ BACK BY AND VERIFIED WITH: MNATT MCBANE @0430  03/04/18 AKT Performed at [facility], [address] [place], [place] [address]     Culture GRAM POSITIVE COCCI  Final   Report Status PENDING  Incomplete  Blood Culture ID Panel (Reflexed)     Status: Abnormal   Collection Time: 03/03/18  5:25 PM  Result Value Ref Range Status   Enterococcus species NOT DETECTED NOT DETECTED Final   Listeria monocytogenes NOT DETECTED NOT DETECTED Final   Staphylococcus species DETECTED (A) NOT DETECTED Final    Comment: CRITICAL RESULT CALLED TO, READ BACK BY AND VERIFIED WITH: MATT MCBANE @0430  03/04/18 AKT    Staphylococcus aureus (BCID) DETECTED (A) NOT DETECTED Final    Comment: Methicillin (oxacillin)-resistant Staphylococcus aureus (MRSA). MRSA is predictably resistant to beta-lactam antibiotics (except ceftaroline). Preferred therapy is vancomycin unless clinically contraindicated. Patient requires contact precautions if  hospitalized. CRITICAL RESULT CALLED TO, READ BACK BY AND VERIFIED WITH: MATT MCBANE @0430  03/04/18 AKT    Methicillin resistance DETECTED (A) NOT DETECTED Final  Comment: CRITICAL RESULT CALLED TO, READ BACK BY AND VERIFIED WITH: MATT MCBANE @0430  03/04/18 AKT    Streptococcus species NOT DETECTED NOT DETECTED Final   Streptococcus agalactiae NOT DETECTED NOT DETECTED Final   Streptococcus pneumoniae NOT DETECTED NOT DETECTED Final   Streptococcus pyogenes NOT DETECTED NOT DETECTED Final   Acinetobacter baumannii NOT DETECTED NOT DETECTED Final   Enterobacteriaceae species NOT DETECTED NOT DETECTED Final   Enterobacter cloacae complex NOT DETECTED NOT DETECTED Final   Escherichia coli NOT DETECTED NOT DETECTED Final   Klebsiella oxytoca NOT DETECTED NOT DETECTED Final   Klebsiella pneumoniae NOT DETECTED NOT DETECTED Final   Proteus species NOT DETECTED NOT DETECTED Final   Serratia marcescens NOT DETECTED NOT DETECTED Final   Haemophilus influenzae NOT DETECTED NOT DETECTED Final   Neisseria meningitidis NOT DETECTED NOT DETECTED Final   Pseudomonas aeruginosa NOT DETECTED NOT DETECTED Final   Candida  albicans NOT DETECTED NOT DETECTED Final   Candida glabrata NOT DETECTED NOT DETECTED Final   Candida krusei NOT DETECTED NOT DETECTED Final   Candida parapsilosis NOT DETECTED NOT DETECTED Final   Candida tropicalis NOT DETECTED NOT DETECTED Final    Comment: Performed at [facility], [address] [place], [place] [address]  Culture, blood (routine x 2)     Status: None (Preliminary result)   Collection Time: 03/03/18  7:44 PM  Result Value Ref Range Status   Specimen Description   Final    BLOOD LEFT ARM Performed at [facility], [address], [place], [place] [address]    Special Requests   Final    BOTTLES DRAWN AEROBIC AND ANAEROBIC Blood Culture adequate volume Performed at [facility], [address] [place], [place] [address]    Culture  Setup Time   Final    GRAM POSITIVE COCCI IN BOTH AEROBIC AND ANAEROBIC BOTTLES Performed at [facility], [address] [place], [place] [address]    Culture GRAM POSITIVE COCCI  Final   Report Status PENDING  Incomplete  MRSA PCR Screening     Status: Abnormal   Collection Time: 03/04/18  3:02 AM  Result Value Ref Range Status   MRSA by PCR POSITIVE (A) NEGATIVE Final    Comment:        The GeneXpert MRSA Assay (FDA approved for NASAL specimens only), is one component of a comprehensive MRSA colonization surveillance program. It is not intended to diagnose MRSA infection nor to guide or monitor treatment for MRSA infections. RESULT CALLED TO, READ BACK BY AND VERIFIED WITH:  Jerrell Mylar AT 2956 03/04/18 SDR Performed at [facility], [address] [place], [place] [address]     RADIOLOGY:  Dg Chest 1 View  Result Date: 03/03/2018 CLINICAL DATA:  Possible pneumonia. Difficulty breathing with fever. EXAM: CHEST  1 VIEW COMPARISON:  09/20/2017 FINDINGS: Lungs are adequately inflated with mild patchy bilateral airspace opacification with some nodular areas which may be due  to infection. No effusion. Cardiomediastinal silhouette and remainder of the exam is unchanged. IMPRESSION: Subtle patchy multifocal airspace process with mild nodularity likely infection. Recommend follow-up chest radiograph 4 weeks. Electronically Signed   By: Elberta Fortis M.D.   On: 03/03/2018 18:59   Mr Cervical Spine W Or Wo Contrast  Result Date: 03/03/2018 CLINICAL DATA:  Initial evaluation for intraspinal abscess and granuloma. EXAM: MRI TOTAL SPINE WITHOUT AND WITH CONTRAST TECHNIQUE: Multisequence MR imaging of the spine from the cervical spine to the sacrum  was performed prior to and following IV contrast administration for evaluation of spinal metastatic disease. CONTRAST:  5 cc of Gadavist. COMPARISON:  Prior MRI from 12/28/2017 and 11/14/2015. FINDINGS: MRI CERVICAL SPINE FINDINGS Alignment: Increased retrolisthesis of C5 on C6 now measuring up to 4 mm. New focal kyphotic angulation of the cervical spine at this level. Mild straightening seen throughout the remainder of the cervical spinal cord. Vertebrae: Progressive disc space narrowing, endplate irregularity, with vertebral body height loss at C5-6, consistent with progressive osteomyelitis discitis. There is persistent abnormal marrow edema and enhancement within the C5 and C6 vertebral bodies, consistent with persistent infection. Circumferential epidural enhancement seen at this level without frank epidural abscess or collection. Epidural enhancement extends cephalad within the dorsal epidural space to the level of C2-3. There is angulation and mild perching of the C5-6 facets bilaterally due to focal kyphotic angulation. Mild edema and enhancement noted within the C5-6 facets as well. Vertebral body heights otherwise maintained. No other evidence for osteomyelitis discitis. Underlying bone marrow signal intensity diffusely decreased on T1 weighted imaging, most commonly related to anemia, smoking, or obesity. No discrete or worrisome  osseous lesions. Cord: There is impingement of the cervical spinal cord with focal cord kinking at the level of C5-6 due to adjacent retrolisthesis of the C5 vertebral body and focal kyphotic angulation (series 25, image 9). No definite cord signal changes, although evaluation somewhat limited by motion artifact. Remainder of the cervical spinal cord otherwise normal in appearance. No other appreciable epidural collection or abscess. Posterior Fossa, vertebral arteries, paraspinal tissues: Visualized brain and posterior fossa within normal limits. Craniocervical junction normal. There is abnormal edema and enhancement throughout the posterior paraspinous musculature, left greater than right. Superimposed multifocal heterogeneous and irregular soft tissue abscesses present within the left paraspinous soft tissues, extending from approximately C2-3 inferiorly through the upper thoracic spine. Largest collection seen at the level of C5 through C7 and measures 2.4 x 2.3 x 3.2 cm (transverse by AP by craniocaudad, series 37, image 16). Relative sparing of the prevertebral soft tissues. Normal intravascular flow voids seen within the vertebral arteries bilaterally. Disc levels: C2-C3: Negative. C3-C4: Enhancement within the dorsal epidural space. Otherwise negative. C4-C5: Enhancement within the dorsal epidural space. Minimal left eccentric disc bulge. Mild spinal stenosis, largely related to the kyphotic angulation inferiorly. Foramina remain patent. C5-C6: Changes consistent with progressive osteomyelitis discitis with loss of C5-6 interspace as well as C5 and C6 vertebral body height loss. Associated 4 mm retrolisthesis of C5 on C6 with focal kyphotic angulation. Superimposed circumferential epidural enhancement at this level. There is impingement of the cervical spinal cord with fairly severe spinal stenosis. Thecal sac measures AP diameter at its most narrow point. Moderate bilateral C6 foraminal narrowing. C6-C7:  Minimal epidural enhancement. No significant disc pathology. No stenosis. C7-T1:  Unremarkable. MRI THORACIC SPINE FINDINGS Alignment: Vertebral bodies normally aligned with preservation of the normal thoracic kyphosis. No listhesis or subluxation. Vertebrae: Vertebral body heights maintained without evidence for acute or chronic fracture. Bone marrow signal intensity diffusely decreased on T1 weighted imaging, most commonly related to anemia, smoking, or obesity. Small benign hemangioma noted within the T7 vertebral body. No other discrete or worrisome osseous lesions. No findings to suggest osteomyelitis for discitis. No definite evidence for septic arthritis. Cord: Signal intensity within the thoracic spinal cord within normal limits. No significant epidural abscess or collection identified. Paraspinal and other soft tissues: Abnormal edema and enhancement with superimposed multifocal soft tissue abscesses present within the left  posterior paraspinous musculature within the upper thoracic spine, extending inferiorly from the cervical spine as above. Changes extend to approximately the T4-5 level. Largest collection within the thoracic spine measures approximately 2.1 cm at the level of T2 (series 39, image 4). Multifocal nodular and patchy densities within the visualized lungs, most consistent with septic emboli. Suggestion of cavitation about several of these lesions. Disc levels: T12-L1: Disc bulge with superimposed shallow left paracentral disc protrusion mildly indents the ventral thecal sac. No significant stenosis. No other significant disc pathology within the thoracic spine. MRI LUMBAR SPINE FINDINGS Segmentation: Normal segmentation. Lowest well-formed disc labeled the L5-S1 level. 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 Alignment otherwise normal with preservation of the normal lumbar lordosis. Vertebrae: Vertebral body heights maintained without evidence  for acute or chronic fracture. Bone marrow signal intensity diffusely decreased on T1 weighted imaging. No discrete or worrisome osseous lesions. No evidence for osteomyelitis discitis or septic arthritis within the lumbar spine. Conus medullaris: Extends to the T12-L1 level and appears normal. Paraspinal and other soft tissues: paraspinous soft tissues demonstrate no acute finding. Few small cysts noted within the right kidney. Visualized visceral structures otherwise grossly unremarkable. Disc levels: No significant findings seen through the L4-5 level. L5-S1: Chronic bilateral L5 pars defects with associated 5 mm spondylolisthesis. Associated broad posterior pseudo disc bulge with disc desiccation and intervertebral disc space narrowing. No significant canal or lateral recess narrowing. Mild to moderate bilateral L5 foraminal stenosis due to slippage. IMPRESSION: 1. 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 Secondary impingement upon the cervical spinal cord at this level with resultant severe spinal stenosis. Neuro surgical consultation recommended. 2. Extensive paraspinous phlegmon and infection with superimposed abscesses involving the central and left posterior paraspinous musculature extending from C2-3 through T4-5 as above. 3. Multifocal nodular densities in opacities within the visualized lungs, highly suspicious for septic emboli. 4. Shallow left paracentral disc protrusion at T12-L1 without significant stenosis. 5. 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 Critical Value/emergent results were called by telephone at the time of interpretation on 03/03/2018 at 11:48 pm to Dr. Derrill Kay , who verbally acknowledged  these results. Electronically Signed   By: Rise Mu M.D.   On: 03/03/2018 23:52   Mr Thoracic Spine W Wo Contrast  Result Date: 03/03/2018 CLINICAL DATA:  Initial evaluation for intraspinal abscess and granuloma. EXAM: MRI TOTAL SPINE WITHOUT AND WITH CONTRAST TECHNIQUE: Multisequence MR imaging of the spine from the cervical spine to the sacrum was performed prior to and following IV contrast administration for evaluation of spinal metastatic disease. CONTRAST:  5 cc of Gadavist. COMPARISON:  Prior MRI from 12/28/2017 and 11/14/2015. FINDINGS: MRI CERVICAL SPINE FINDINGS Alignment: Increased retrolisthesis of C5 on C6 now measuring up to 4 mm. New focal kyphotic angulation of the cervical spine at this level. Mild straightening seen throughout the remainder of the cervical spinal cord. Vertebrae: Progressive disc space narrowing, endplate irregularity, with vertebral body height loss at C5-6, consistent with progressive osteomyelitis discitis. There is persistent abnormal marrow edema and enhancement within the C5 and C6 vertebral bodies, consistent with persistent infection. Circumferential epidural enhancement seen at this level without frank epidural abscess or collection. Epidural enhancement extends cephalad within the dorsal  epidural space to the level of C2-3. There is angulation and mild perching of the C5-6 facets bilaterally due to focal kyphotic angulation. Mild edema and enhancement noted within the C5-6 facets as well. Vertebral body heights otherwise maintained. No other evidence for osteomyelitis discitis. Underlying bone marrow signal intensity diffusely decreased on T1 weighted imaging, most commonly related to anemia, smoking, or obesity. No discrete or worrisome osseous lesions. Cord: There is impingement of the cervical spinal cord with focal cord kinking at the level of C5-6 due to adjacent retrolisthesis of the C5 vertebral body and focal kyphotic angulation (series 25, image  9). No definite cord signal changes, although evaluation somewhat limited by motion artifact. Remainder of the cervical spinal cord otherwise normal in appearance. No other appreciable epidural collection or abscess. Posterior Fossa, vertebral arteries, paraspinal tissues: Visualized brain and posterior fossa within normal limits. Craniocervical junction normal. There is abnormal edema and enhancement throughout the posterior paraspinous musculature, left greater than right. Superimposed multifocal heterogeneous and irregular soft tissue abscesses present within the left paraspinous soft tissues, extending from approximately C2-3 inferiorly through the upper thoracic spine. Largest collection seen at the level of C5 through C7 and measures 2.4 x 2.3 x 3.2 cm (transverse by AP by craniocaudad, series 37, image 16). Relative sparing of the prevertebral soft tissues. Normal intravascular flow voids seen within the vertebral arteries bilaterally. Disc levels: C2-C3: Negative. C3-C4: Enhancement within the dorsal epidural space. Otherwise negative. C4-C5: Enhancement within the dorsal epidural space. Minimal left eccentric disc bulge. Mild spinal stenosis, largely related to the kyphotic angulation inferiorly. Foramina remain patent. C5-C6: Changes consistent with progressive osteomyelitis discitis with loss of C5-6 interspace as well as C5 and C6 vertebral body height loss. Associated 4 mm retrolisthesis of C5 on C6 with focal kyphotic angulation. Superimposed circumferential epidural enhancement at this level. There is impingement of the cervical spinal cord with fairly severe spinal stenosis. Thecal sac measures AP diameter at its most narrow point. Moderate bilateral C6 foraminal narrowing. C6-C7: Minimal epidural enhancement. No significant disc pathology. No stenosis. C7-T1:  Unremarkable. MRI THORACIC SPINE FINDINGS Alignment: Vertebral bodies normally aligned with preservation of the normal thoracic kyphosis. No  listhesis or subluxation. Vertebrae: Vertebral body heights maintained without evidence for acute or chronic fracture. Bone marrow signal intensity diffusely decreased on T1 weighted imaging, most commonly related to anemia, smoking, or obesity. Small benign hemangioma noted within the T7 vertebral body. No other discrete or worrisome osseous lesions. No findings to suggest osteomyelitis for discitis. No definite evidence for septic arthritis. Cord: Signal intensity within the thoracic spinal cord within normal limits. No significant epidural abscess or collection identified. Paraspinal and other soft tissues: Abnormal edema and enhancement with superimposed multifocal soft tissue abscesses present within the left posterior paraspinous musculature within the upper thoracic spine, extending inferiorly from the cervical spine as above. Changes extend to approximately the T4-5 level. Largest collection within the thoracic spine measures approximately 2.1 cm at the level of T2 (series 39, image 4). Multifocal nodular and patchy densities within the visualized lungs, most consistent with septic emboli. Suggestion of cavitation about several of these lesions. Disc levels: T12-L1: Disc bulge with superimposed shallow left paracentral disc protrusion mildly indents the ventral thecal sac. No significant stenosis. No other significant disc pathology within the thoracic spine. MRI LUMBAR SPINE FINDINGS Segmentation: Normal segmentation. Lowest well-formed disc labeled the L5-S1 level. 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 Alignment otherwise normal with preservation of the  normal lumbar lordosis. Vertebrae: Vertebral body heights maintained without evidence for acute or chronic fracture. Bone marrow signal intensity diffusely decreased on T1 weighted imaging. No discrete or worrisome osseous lesions. No evidence for osteomyelitis discitis or septic arthritis within the lumbar  spine. Conus medullaris: Extends to the T12-L1 level and appears normal. Paraspinal and other soft tissues: paraspinous soft tissues demonstrate no acute finding. Few small cysts noted within the right kidney. Visualized visceral structures otherwise grossly unremarkable. Disc levels: No significant findings seen through the L4-5 level. L5-S1: Chronic bilateral L5 pars defects with associated 5 mm spondylolisthesis. Associated broad posterior pseudo disc bulge with disc desiccation and intervertebral disc space narrowing. No significant canal or lateral recess narrowing. Mild to moderate bilateral L5 foraminal stenosis due to slippage. IMPRESSION: 1. 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 Secondary impingement upon the cervical spinal cord at this level with resultant severe spinal stenosis. Neuro surgical consultation recommended. 2. Extensive paraspinous phlegmon and infection with superimposed abscesses involving the central and left posterior paraspinous musculature extending from C2-3 through T4-5 as above. 3. Multifocal nodular densities in opacities within the visualized lungs, highly suspicious for septic emboli. 4. Shallow left paracentral disc protrusion at T12-L1 without significant stenosis. 5. 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 Critical Value/emergent results were called by telephone at the time of interpretation on 03/03/2018 at 11:48 pm to Dr. Derrill Kay , who verbally acknowledged these results. Electronically Signed   By: Rise Mu M.D.   On: 03/03/2018 23:52   Mr Lumbar Spine W Wo Contrast  Result Date: 03/03/2018 CLINICAL DATA:  Initial evaluation for intraspinal abscess and  granuloma. EXAM: MRI TOTAL SPINE WITHOUT AND WITH CONTRAST TECHNIQUE: Multisequence MR imaging of the spine from the cervical spine to the sacrum was performed prior to and following IV contrast administration for evaluation of spinal metastatic disease. CONTRAST:  5 cc of Gadavist. COMPARISON:  Prior MRI from 12/28/2017 and 11/14/2015. FINDINGS: MRI CERVICAL SPINE FINDINGS Alignment: Increased retrolisthesis of C5 on C6 now measuring up to 4 mm. New focal kyphotic angulation of the cervical spine at this level. Mild straightening seen throughout the remainder of the cervical spinal cord. Vertebrae: Progressive disc space narrowing, endplate irregularity, with vertebral body height loss at C5-6, consistent with progressive osteomyelitis discitis. There is persistent abnormal marrow edema and enhancement within the C5 and C6 vertebral bodies, consistent with persistent infection. Circumferential epidural enhancement seen at this level without frank epidural abscess or collection. Epidural enhancement extends cephalad within the dorsal epidural space to the level of C2-3. There is angulation and mild perching of the C5-6 facets bilaterally due to focal kyphotic angulation. Mild edema and enhancement noted within the C5-6 facets as well. Vertebral body heights otherwise maintained. No other evidence for osteomyelitis discitis. Underlying bone marrow signal intensity diffusely decreased on T1 weighted imaging, most commonly related to anemia, smoking, or obesity. No discrete or worrisome osseous lesions. Cord: There is impingement of the cervical spinal cord with focal cord kinking at the level of C5-6 due to adjacent retrolisthesis of the C5 vertebral body and focal kyphotic angulation (series 25, image 9). No definite cord signal changes, although evaluation somewhat limited by motion artifact. Remainder of the cervical spinal cord otherwise normal in appearance.  No other appreciable epidural collection or abscess.  Posterior Fossa, vertebral arteries, paraspinal tissues: Visualized brain and posterior fossa within normal limits. Craniocervical junction normal. There is abnormal edema and enhancement throughout the posterior paraspinous musculature, left greater than right. Superimposed multifocal heterogeneous and irregular soft tissue abscesses present within the left paraspinous soft tissues, extending from approximately C2-3 inferiorly through the upper thoracic spine. Largest collection seen at the level of C5 through C7 and measures 2.4 x 2.3 x 3.2 cm (transverse by AP by [email or web address], series 37, image 16). Relative sparing of the prevertebral soft tissues. Normal intravascular flow voids seen within the vertebral arteries bilaterally. Disc levels: C2-C3: Negative. C3-C4: Enhancement within the dorsal epidural space. Otherwise negative. C4-C5: Enhancement within the dorsal epidural space. Minimal left eccentric disc bulge. Mild spinal stenosis, largely related to the kyphotic angulation inferiorly. Foramina remain patent. C5-C6: Changes consistent with progressive osteomyelitis discitis with loss of C5-6 interspace as well as C5 and C6 vertebral body height loss. Associated 4 mm retrolisthesis of C5 on C6 with focal kyphotic angulation. Superimposed circumferential epidural enhancement at this level. There is impingement of the cervical spinal cord with fairly severe spinal stenosis. Thecal sac measures AP diameter at its most narrow point. Moderate bilateral C6 foraminal narrowing. C6-C7: Minimal epidural enhancement. No significant disc pathology. No stenosis. C7-T1:  Unremarkable. MRI THORACIC SPINE FINDINGS Alignment: Vertebral bodies normally aligned with preservation of the normal thoracic kyphosis. No listhesis or subluxation. Vertebrae: Vertebral body heights maintained without evidence for acute or chronic fracture. Bone marrow signal intensity diffusely decreased on T1 weighted imaging, most commonly related to  anemia, smoking, or obesity. Small benign hemangioma noted within the T7 vertebral body. No other discrete or worrisome osseous lesions. No findings to suggest osteomyelitis for discitis. No definite evidence for septic arthritis. Cord: Signal intensity within the thoracic spinal cord within normal limits. No significant epidural abscess or collection identified. Paraspinal and other soft tissues: Abnormal edema and enhancement with superimposed multifocal soft tissue abscesses present within the left posterior paraspinous musculature within the upper thoracic spine, extending inferiorly from the cervical spine as above. Changes extend to approximately the T4-5 level. Largest collection within the thoracic spine measures approximately 2.1 cm at the level of T2 (series 39, image 4). Multifocal nodular and patchy densities within the visualized lungs, most consistent with septic emboli. Suggestion of cavitation about several of these lesions. Disc levels: T12-L1: Disc bulge with superimposed shallow left paracentral disc protrusion mildly indents the ventral thecal sac. No significant stenosis. No other significant disc pathology within the thoracic spine. MRI LUMBAR SPINE FINDINGS Segmentation: Normal segmentation. Lowest well-formed disc labeled the L5-S1 level. 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 Alignment otherwise normal with preservation of the normal lumbar lordosis. Vertebrae: Vertebral body heights maintained without evidence for acute or chronic fracture. Bone marrow signal intensity diffusely decreased on T1 weighted imaging. No discrete or worrisome osseous lesions. No evidence for osteomyelitis discitis or septic arthritis within the lumbar spine. Conus medullaris: Extends to the T12-L1 level and appears normal. Paraspinal and other soft tissues: paraspinous soft tissues demonstrate no acute finding. Few small cysts noted within the right kidney.  Visualized visceral structures otherwise grossly unremarkable. Disc levels: No significant findings seen through the L4-5 level. L5-S1: Chronic bilateral L5 pars defects with associated 5 mm spondylolisthesis. Associated broad posterior pseudo disc bulge with disc desiccation and intervertebral disc space narrowing. No significant canal or lateral recess narrowing. Mild to moderate bilateral L5  foraminal stenosis due to slippage. IMPRESSION: 1. 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 Secondary impingement upon the cervical spinal cord at this level with resultant severe spinal stenosis. Neuro surgical consultation recommended. 2. Extensive paraspinous phlegmon and infection with superimposed abscesses involving the central and left posterior paraspinous musculature extending from C2-3 through T4-5 as above. 3. Multifocal nodular densities in opacities within the visualized lungs, highly suspicious for septic emboli. 4. Shallow left paracentral disc protrusion at T12-L1 without significant stenosis. 5. 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 Critical Value/emergent results were called by telephone at the time of interpretation on 03/03/2018 at 11:48 pm to Dr. Derrill Kay , who verbally acknowledged these results. Electronically Signed   By: Rise Mu M.D.   On: 03/03/2018 23:52     Management plans discussed with the patient, family and they are in agreement.  CODE STATUS:     Code Status Orders  (From admission, onward)         Start     Ordered   03/04/18 0244  Full code  Continuous     03/04/18 0243        Code Status History    Date Active Date Inactive Code Status Order ID Comments User  Context   12/28/2017 1331 01/11/2018 2106 Full Code [id number]  Adrian Saran, MD Inpatient   09/20/2017 2027 09/24/2017 1255 Full Code [id number]  Rometta Emery, MD ED   11/13/2015 0614 11/20/2015 1845 Full Code [id number]  Ihor Austin, MD ED   04/28/2013 0742 04/29/2013 1615 Full Code [id number]  Almond Lint, MD Inpatient      TOTAL TIME TAKING CARE OF THIS PATIENT: *40 minutes.    [name] M.D on 03/04/2018 at 6:10 PM  Between 7am to 6pm - Pager - [phone] After 6pm go to [email or web address] - Social research officer, government  [facility]  Office  [phone]  CC: Primary care physician; Alberteen Spindle, CNM

## 2018-03-04 NOTE — Progress Notes (Signed)
Pharmacy - Brief Note  Patient with h/o serratia marcescens bacteremia and epidural abscess.  History of IVDU.  Received 14d of effective IV therapy prior to leaving AMA 9/10.  She was given prescription for levofloxacin on day she left but appears did not fill until 10/2.  Blood cultures for current admission reveal MRSA - ID aware (auto consult).    Plan:  Increase Cefepime to 2gm IV q12h (renal dose adj for 2gm q8h) - use more aggressive dosing based on discitis/osteomyelitis and persistent epidural abscess on MRI.    Juliette Alcide, PharmD, BCPS.   Work Cell: [phone] 03/04/2018 8:12 AM

## 2018-03-04 NOTE — Consult Note (Signed)
Referring Physician:  No referring provider defined for this encounter.  Primary Physician:  Alberteen Spindle, CNM  Chief Complaint: Neck pain  History of Present Illness: Patient was poor historian due to inability to focus because of pain/medication. History obtained through previous provider notes.  Andrea King is a 42 y.o. female with a history of IVDA who presents with the chief complaint of neck pain.  Recent history includes admission to [facility] 12/29/2017 for cervical discitis with epidural abscess. She was started on IV antibiotics with the intention of treatment for 6 weeks.  However, after a few weeks she left the hospital AMA without completing full treatment.    Presented to the emergency room yesterday with complaints of neck pain, back pain, right arm numbness, and left arm numbness.  ED provider had spoken to Dr. Marcell Barlow and no weakness was noted on physical exam, but imaging revealed progressive osteomyelitis discitis as well as 4 mm retrolisthesis of C5 on C6 resulting in fairly severe spinal stenosis.  Patient was admitted for treatment.  On evaluation, patient complains of severe, midline, low posterior cervical pain; right arm numbness; left calf numbness; bilateral knee pain. Unable to determine how long symptoms have been present.  Right arm numbness described posterior aspect of upper extremity with involvement of first 2 digits. Left leg numbness circumferentially through calf.    Bilateral knee pain may have been caused by a fall but she is unable to elaborate.  Complains that "muscles don't work".   Review of Systems:  A 10 point review of systems is negative, except for the pertinent positives and negatives detailed in the HPI.  Past Medical History: Past Medical History:  Diagnosis Date  . Anemia   . Asthma   . DJD (degenerative joint disease)   . Hepatitis C   . Substance abuse [facility])     Past Surgical History: Past Surgical History:  Procedure  Laterality Date  . INGUINAL HERNIA REPAIR Left 04/28/2013   Procedure: HERNIA REPAIR INGUINAL ADULT with mesh;  Surgeon: Cherylynn Ridges, MD;  Location: [facility] OR;  Service: General;  Laterality: Left;  . TEE WITHOUT CARDIOVERSION N/A 11/18/2015   Procedure: TRANSESOPHAGEAL ECHOCARDIOGRAM (TEE);  Surgeon: Iran Ouch, MD;  Location: [facility] ORS;  Service: Cardiovascular;  Laterality: N/A;  . TEE WITHOUT CARDIOVERSION N/A 12/31/2017   Procedure: TRANSESOPHAGEAL ECHOCARDIOGRAM (TEE);  Surgeon: Dalia Heading, MD;  Location: [facility] ORS;  Service: Cardiovascular;  Laterality: N/A;  . TEE WITHOUT CARDIOVERSION N/A 01/04/2018   Procedure: TRANSESOPHAGEAL ECHOCARDIOGRAM (TEE);  Surgeon: Dalia Heading, MD;  Location: [facility] ORS;  Service: Cardiovascular;  Laterality: N/A;  . TUMOR EXCISION     left scalpula    Allergies: Allergies as of 03/03/2018  . (No Known Allergies)    Medications:  Current Facility-Administered Medications:  .  0.9 %  sodium chloride infusion, , Intravenous, Continuous, [name], MD, Stopped at 03/04/18 1200 .  acetaminophen (TYLENOL) tablet 650 mg, 650 mg, Oral, Q6H PRN, 650 mg at 03/04/18 0259 **OR** acetaminophen (TYLENOL) suppository 650 mg, 650 mg, Rectal, Q6H PRN, [name], MD .  ceFEPIme (MAXIPIME) 2 g in sodium chloride 0.9 % 100 mL IVPB, 2 g, Intravenous, Q12H, Oralia Manis, MD, Stopped at 03/04/18 1028 .  Chlorhexidine Gluconate Cloth 2 % PADS 6 each, 6 each, Topical, Q0600, [name], MD, 6 each at 03/04/18 0913 .  enoxaparin (LOVENOX) injection 40 mg, 40 mg, Subcutaneous, Q24H, Oralia Manis, MD .  feeding supplement (ENSURE ENLIVE) (ENSURE  ENLIVE) liquid 237 mL, 237 mL, Oral, BID BM, [name], MD .  fentaNYL (DURAGESIC - dosed mcg/hr) 50 mcg, 50 mcg, Transdermal, Q72H, [name], MD, 50 mcg at 03/04/18 1401 .  HYDROmorphone (DILAUDID) injection 2 mg, 2 mg, Intravenous, Q4H PRN, [name], MD, 2 mg at 03/04/18 1426 .  multivitamin with minerals  tablet 1 tablet, 1 tablet, Oral, Daily, [name], MD, 1 tablet at 03/04/18 1401 .  mupirocin ointment (BACTROBAN) 2 % 1 application, 1 application, Nasal, BID, [name], MD, 1 application at 03/04/18 0913 .  ondansetron (ZOFRAN) tablet 4 mg, 4 mg, Oral, Q6H PRN **OR** ondansetron (ZOFRAN) injection 4 mg, 4 mg, Intravenous, Q6H PRN, Oralia Manis, MD .  oxyCODONE (Oxy IR/ROXICODONE) immediate release tablet 5 mg, 5 mg, Oral, Q4H PRN, [name], MD, 5 mg at 03/04/18 1049 .  potassium PHOSPHATE 15 mmol in dextrose 5 % 250 mL infusion, 15 mmol, Intravenous, Once, [name], MD, Last Rate: 43 mL/hr at 03/04/18 1410, 15 mmol at 03/04/18 1410 .  vancomycin (VANCOCIN) IVPB 750 mg/150 ml premix, 750 mg, Intravenous, Q24H, Oralia Manis, MD, Last Rate: 150 mL/hr at 03/04/18 1404, 750 mg at 03/04/18 1404   Social History: Social History   Tobacco Use  . Smoking status: Current Every Day Smoker    Types: Cigarettes  . Smokeless tobacco: Never Used  Substance Use Topics  . Alcohol use: No    Alcohol/week: 0.0 standard drinks  . Drug use: Yes    Comment: cocaine,Heroine    Family Medical History: Family History  Problem Relation Age of Onset  . Cancer Mother   . Cancer - Colon Maternal Grandfather   . Diabetes Maternal Grandfather     Physical Examination: Vitals:   03/04/18 0457 03/04/18 1426  BP: (!) 99/55 (!) 111/55  Pulse: (!) 130 (!) 136  Resp: (!) 28 (!) 28  Temp: 99.4 F (37.4 C) 98.6 F (37 C)  SpO2: 97% 95%     General: Patient is agitated, sleepy, and unfocused.  Psychiatric: Patient is non-anxious. Neck:   Supple.  Full range of motion. Respiratory: Patient is breathing without any difficulty. Extremities: No edema. Vascular: Palpable pulses in dorsal pedal vessels. Skin:   On exposed skin, there are no abnormal skin lesions.  NEUROLOGICAL:  General: moderate distress, speech slurred. Responds appropriately to voice.    Awake, alert, oriented to person,  place, and time.  Facial tone is symmetric. ROM of spine: Pain elicited on movement.  Palpation of spine: tenderness to palpation midline low posterior cervical spine.    Strength: Side Biceps Triceps Deltoid Interossei Grip Wrist Ext. Wrist Flex.  R 4+ 4 4- 4- 4 4+ 4+  L 4+ 4 4- 4- 4 4+  4+   Side Iliopsoas Quads Hamstring PF DF EHL  R 4+ 4 4- 4+ 4+ -  L 4+ 4 4- 4+ 4+ -  *potentially effort limited weakness.   Reflexes are 3+ and symmetric at the biceps, triceps, brachioradialis, patella and achilles.  Decreased sensation posterior aspect of right arm.  Crease sensation circumferentially left calf..  Clonus is not present.  Toes are down-going.  Gait not assessed.  Hoffman's is absent.  Imaging: EXAM: MRI TOTAL SPINE WITHOUT AND WITH CONTRAST  TECHNIQUE: Multisequence MR imaging of the spine from the cervical spine to the sacrum was performed prior to and following IV contrast administration for evaluation of spinal metastatic disease.  CONTRAST:  5 cc of Gadavist.  COMPARISON:  Prior MRI from  12/28/2017 and 11/14/2015.  FINDINGS: MRI CERVICAL SPINE FINDINGS  Alignment: Increased retrolisthesis of C5 on C6 now measuring up to 4 mm. New focal kyphotic angulation of the cervical spine at this level. Mild straightening seen throughout the remainder of the cervical spinal cord.  Vertebrae: Progressive disc space narrowing, endplate irregularity, with vertebral body height loss at C5-6, consistent with progressive osteomyelitis discitis. There is persistent abnormal marrow edema and enhancement within the C5 and C6 vertebral bodies, consistent with persistent infection. Circumferential epidural enhancement seen at this level without frank epidural abscess or collection. Epidural enhancement extends cephalad within the dorsal epidural space to the level of C2-3. There is angulation and mild perching of the C5-6 facets bilaterally due to focal kyphotic angulation. Mild  edema and enhancement noted within the C5-6 facets as well.  Vertebral body heights otherwise maintained. No other evidence for osteomyelitis discitis. Underlying bone marrow signal intensity diffusely decreased on T1 weighted imaging, most commonly related to anemia, smoking, or obesity. No discrete or worrisome osseous lesions.  Cord: There is impingement of the cervical spinal cord with focal cord kinking at the level of C5-6 due to adjacent retrolisthesis of the C5 vertebral body and focal kyphotic angulation (series 25, image 9). No definite cord signal changes, although evaluation somewhat limited by motion artifact. Remainder of the cervical spinal cord otherwise normal in appearance. No other appreciable epidural collection or abscess.  Posterior Fossa, vertebral arteries, paraspinal tissues: Visualized brain and posterior fossa within normal limits. Craniocervical junction normal.  There is abnormal edema and enhancement throughout the posterior paraspinous musculature, left greater than right. Superimposed multifocal heterogeneous and irregular soft tissue abscesses present within the left paraspinous soft tissues, extending from approximately C2-3 inferiorly through the upper thoracic spine. Largest collection seen at the level of C5 through C7 and measures 2.4 x 2.3 x 3.2 cm (transverse by AP by [email or web address], series 37, image 16). Relative sparing of the prevertebral soft tissues. Normal intravascular flow voids seen within the vertebral arteries bilaterally.  Disc levels:  C2-C3: Negative.  C3-C4: Enhancement within the dorsal epidural space. Otherwise negative.  C4-C5: Enhancement within the dorsal epidural space. Minimal left eccentric disc bulge. Mild spinal stenosis, largely related to the kyphotic angulation inferiorly. Foramina remain patent.  C5-C6: Changes consistent with progressive osteomyelitis discitis with loss of C5-6 interspace as well  as C5 and C6 vertebral body height loss. Associated 4 mm retrolisthesis of C5 on C6 with focal kyphotic angulation. Superimposed circumferential epidural enhancement at this level. There is impingement of the cervical spinal cord with fairly severe spinal stenosis. Thecal sac measures AP diameter at its most narrow point. Moderate bilateral C6 foraminal narrowing.  C6-C7: Minimal epidural enhancement. No significant disc pathology. No stenosis.  C7-T1:  Unremarkable.  MRI THORACIC SPINE FINDINGS  Alignment: Vertebral bodies normally aligned with preservation of the normal thoracic kyphosis. No listhesis or subluxation.  Vertebrae: Vertebral body heights maintained without evidence for acute or chronic fracture. Bone marrow signal intensity diffusely decreased on T1 weighted imaging, most commonly related to anemia, smoking, or obesity. Small benign hemangioma noted within the T7 vertebral body. No other discrete or worrisome osseous lesions. No findings to suggest osteomyelitis for discitis. No definite evidence for septic arthritis.  Cord: Signal intensity within the thoracic spinal cord within normal limits. No significant epidural abscess or collection identified.  Paraspinal and other soft tissues: Abnormal edema and enhancement with superimposed multifocal soft tissue abscesses present within the left posterior paraspinous musculature within the upper thoracic  spine, extending inferiorly from the cervical spine as above. Changes extend to approximately the T4-5 level. Largest collection within the thoracic spine measures approximately 2.1 cm at the level of T2 (series 39, image 4).  Multifocal nodular and patchy densities within the visualized lungs, most consistent with septic emboli. Suggestion of cavitation about several of these lesions.  Disc levels:  T12-L1: Disc bulge with superimposed shallow left paracentral disc protrusion mildly indents the  ventral thecal sac. No significant stenosis.  No other significant disc pathology within the thoracic spine.  MRI LUMBAR SPINE FINDINGS  Segmentation: Normal segmentation. Lowest well-formed disc labeled the L5-S1 level.  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 Alignment otherwise normal with preservation of the normal lumbar lordosis.  Vertebrae: Vertebral body heights maintained without evidence for acute or chronic fracture. Bone marrow signal intensity diffusely decreased on T1 weighted imaging. No discrete or worrisome osseous lesions. No evidence for osteomyelitis discitis or septic arthritis within the lumbar spine.  Conus medullaris: Extends to the T12-L1 level and appears normal.  Paraspinal and other soft tissues: paraspinous soft tissues demonstrate no acute finding. Few small cysts noted within the right kidney. Visualized visceral structures otherwise grossly unremarkable.  Disc levels:  No significant findings seen through the L4-5 level.  L5-S1: Chronic bilateral L5 pars defects with associated 5 mm spondylolisthesis. Associated broad posterior pseudo disc bulge with disc desiccation and intervertebral disc space narrowing. No significant canal or lateral recess narrowing. Mild to moderate bilateral L5 foraminal stenosis due to slippage.  IMPRESSION: 1. 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 Secondary impingement upon the cervical spinal cord at this level with resultant severe spinal stenosis. Neuro surgical consultation recommended. 2. Extensive paraspinous phlegmon and infection with superimposed abscesses involving the central and left posterior paraspinous musculature extending from C2-3  through T4-5 as above. 3. Multifocal nodular densities in opacities within the visualized lungs, highly suspicious for septic emboli. 4. Shallow left paracentral disc protrusion at T12-L1 without significant stenosis. 5. 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  Assessment and Plan: Ms. Casas is a pleasant 42 y.o. 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  Dr. Marcell Barlow reviewed case with the ED physician, at which time it was noted patient did not exhibit weakness in extremities.  On evaluation today, patient had diffuse weakness in upper and lower extremities; however, even after multiple attempts, physical exam was difficulty to obtain due to significant pain limitations.  I have discussed this with Dr. Marcell Barlow and recommendations are to transfer patient to [facility] for surgical consideration.  Ivar Drape, PA-C Dept. of Neurosurgery

## 2018-03-04 NOTE — H&P (Addendum)
[facility] - [place] [facility]   PATIENT NAME: Andrea King    MR#:  [id number]  DATE OF BIRTH:  08-Nov-1975  DATE OF ADMISSION:  03/03/2018  PRIMARY CARE PHYSICIAN: Alberteen Spindle, CNM   REQUESTING/REFERRING PHYSICIAN: Derrill Kay, MD  CHIEF COMPLAINT:   Chief Complaint  Patient presents with  . Back Pain  . Neck Pain    HISTORY OF PRESENT ILLNESS:  Andrea King  is a 42 y.o. female who presents with chief complaint as above.  Patient presents with worsening symptoms of subjective fever, tingling and numbness in her right hand and left leg.  She was admitted here a few months ago with bacteremia, and then last month with an epidural abscess in her neck.  She left AMA after few weeks in the hospital.  She was supposed to get 6 weeks of IV antibiotics but she left AMA and given her history of IV drug use was not allowed to leave with access in place.  She returns tonight after several days of feeling worse again.  Repeat MRI imaging tonight shows persistent epidural abscess.  She meets sepsis criteria.  Neurosurgery contacted by ED physician and recommended patient be admitted with IV antibiotics and they will see her.  Hospitalist were called for admission  PAST MEDICAL HISTORY:   Past Medical History:  Diagnosis Date  . Anemia   . Asthma   . DJD (degenerative joint disease)   . Hepatitis C   . Substance abuse (HCC)      PAST SURGICAL HISTORY:   Past Surgical History:  Procedure Laterality Date  . INGUINAL HERNIA REPAIR Left 04/28/2013   Procedure: HERNIA REPAIR INGUINAL ADULT with mesh;  Surgeon: Cherylynn Ridges, MD;  Location: [facility] OR;  Service: General;  Laterality: Left;  . TEE WITHOUT CARDIOVERSION N/A 11/18/2015   Procedure: TRANSESOPHAGEAL ECHOCARDIOGRAM (TEE);  Surgeon: Iran Ouch, MD;  Location: [facility] ORS;  Service: Cardiovascular;  Laterality: N/A;  . TEE WITHOUT CARDIOVERSION N/A 12/31/2017   Procedure: TRANSESOPHAGEAL  ECHOCARDIOGRAM (TEE);  Surgeon: Dalia Heading, MD;  Location: [facility] ORS;  Service: Cardiovascular;  Laterality: N/A;  . TEE WITHOUT CARDIOVERSION N/A 01/04/2018   Procedure: TRANSESOPHAGEAL ECHOCARDIOGRAM (TEE);  Surgeon: Dalia Heading, MD;  Location: [facility] ORS;  Service: Cardiovascular;  Laterality: N/A;  . TUMOR EXCISION     left scalpula     SOCIAL HISTORY:   Social History   Tobacco Use  . Smoking status: Current Every Day Smoker    Types: Cigarettes  . Smokeless tobacco: Never Used  Substance Use Topics  . Alcohol use: No    Alcohol/week: 0.0 standard drinks     FAMILY HISTORY:   Family History  Problem Relation Age of Onset  . Cancer Mother   . Cancer - Colon Maternal Grandfather   . Diabetes Maternal Grandfather      DRUG ALLERGIES:  No Known Allergies  MEDICATIONS AT HOME:   Prior to Admission medications   Medication Sig Start Date End Date Taking? Authorizing Provider  acetaminophen (TYLENOL) 500 MG tablet Take 500-1,000 mg by mouth every 6 (six) hours as needed for mild pain or moderate pain.   Yes [provider]  ibuprofen (ADVIL,MOTRIN) 200 MG tablet Take 600 mg by mouth every 6 (six) hours as needed (for pain or headaches).    Yes [provider]  levofloxacin (LEVAQUIN) 750 MG tablet Take 1 tablet (750 mg total) by mouth daily. Patient not taking: Reported on 03/03/2018 01/11/18  Milagros Loll, MD    REVIEW OF SYSTEMS:  Review of Systems  Constitutional: Positive for fever and malaise/fatigue. Negative for chills and weight loss.  HENT: Negative for ear pain, hearing loss and tinnitus.   Eyes: Negative for blurred vision, double vision, pain and redness.  Respiratory: Negative for cough, hemoptysis and shortness of breath.   Cardiovascular: Negative for chest pain, palpitations, orthopnea and leg swelling.  Gastrointestinal: Negative for abdominal pain, constipation, diarrhea, nausea and vomiting.  Genitourinary: Negative for  dysuria, frequency and hematuria.  Musculoskeletal: Positive for neck pain. Negative for back pain and joint pain.  Skin:       No acne, rash, or lesions  Neurological: Negative for dizziness, tremors, focal weakness and weakness.  Endo/Heme/Allergies: Negative for polydipsia. Does not bruise/bleed easily.  Psychiatric/Behavioral: Negative for depression. The patient is not nervous/anxious and does not have insomnia.      VITAL SIGNS:   Vitals:   03/04/18 0000 03/04/18 0015 03/04/18 0030 03/04/18 0045  BP: (!) 98/48 (!) 103/55 107/61 (!) 103/59  Pulse: (!) 109 (!) 118 (!) 120 (!) 123  Resp:      Temp:      TempSrc:      SpO2: 97% 98% 95% 97%  Weight:      Height:       Wt Readings from Last 3 Encounters:  03/03/18 49.9 kg  01/04/18 52 kg  09/21/17 51.8 kg    PHYSICAL EXAMINATION:  Physical Exam  Vitals reviewed. Constitutional: She is oriented to person, place, and time. She appears well-developed and well-nourished. She appears distressed.  HENT:  Head: Normocephalic and atraumatic.  Mouth/Throat: Oropharynx is clear and moist.  Eyes: Pupils are equal, round, and reactive to light. Conjunctivae and EOM are normal. No scleral icterus.  Neck: Normal range of motion. Neck supple. No JVD present. No thyromegaly present.  Cardiovascular: Regular rhythm and intact distal pulses. Exam reveals no gallop and no friction rub.  No murmur heard. Tachycardic  Respiratory: Effort normal and breath sounds normal. No respiratory distress. She has no wheezes. She has no rales.  GI: Soft. Bowel sounds are normal. She exhibits no distension. There is no tenderness.  Musculoskeletal: Normal range of motion. She exhibits tenderness (Cervical spine tenderness). She exhibits no edema.  No arthritis, no gout  Lymphadenopathy:    She has no cervical adenopathy.  Neurological: She is alert and oriented to person, place, and time. No cranial nerve deficit.  No dysarthria, no aphasia  Skin: Skin  is warm and dry. No rash noted. No erythema.  Psychiatric: She has a normal mood and affect. Her behavior is normal. Judgment and thought content normal.    LABORATORY PANEL:   CBC Recent Labs  Lab 03/03/18 1709  WBC 28.3*  HGB 10.9*  HCT 32.3*  PLT 168   ------------------------------------------------------------------------------------------------------------------  Chemistries  Recent Labs  Lab 03/03/18 1709  NA 128*  K 3.1*  CL 94*  CO2 22  GLUCOSE 161*  BUN 37*  CREATININE 1.31*  CALCIUM 8.5*  AST 26  ALT 11  ALKPHOS 144*  BILITOT 0.5   ------------------------------------------------------------------------------------------------------------------  Cardiac Enzymes No results for input(s): TROPONINI in the last 168 hours. ------------------------------------------------------------------------------------------------------------------  RADIOLOGY:  Dg Chest 1 View  Result Date: 03/03/2018 CLINICAL DATA:  Possible pneumonia. Difficulty breathing with fever. EXAM: CHEST  1 VIEW COMPARISON:  09/20/2017 FINDINGS: Lungs are adequately inflated with mild patchy bilateral airspace opacification with some nodular areas which may be due to infection. No effusion. Cardiomediastinal  silhouette and remainder of the exam is unchanged. IMPRESSION: Subtle patchy multifocal airspace process with mild nodularity likely infection. Recommend follow-up chest radiograph 4 weeks. Electronically Signed   By: Elberta Fortis M.D.   On: 03/03/2018 18:59   Mr Cervical Spine W Or Wo Contrast  Result Date: 03/03/2018 CLINICAL DATA:  Initial evaluation for intraspinal abscess and granuloma. EXAM: MRI TOTAL SPINE WITHOUT AND WITH CONTRAST TECHNIQUE: Multisequence MR imaging of the spine from the cervical spine to the sacrum was performed prior to and following IV contrast administration for evaluation of spinal metastatic disease. CONTRAST:  5 cc of Gadavist. COMPARISON:  Prior MRI from  12/28/2017 and 11/14/2015. FINDINGS: MRI CERVICAL SPINE FINDINGS Alignment: Increased retrolisthesis of C5 on C6 now measuring up to 4 mm. New focal kyphotic angulation of the cervical spine at this level. Mild straightening seen throughout the remainder of the cervical spinal cord. Vertebrae: Progressive disc space narrowing, endplate irregularity, with vertebral body height loss at C5-6, consistent with progressive osteomyelitis discitis. There is persistent abnormal marrow edema and enhancement within the C5 and C6 vertebral bodies, consistent with persistent infection. Circumferential epidural enhancement seen at this level without frank epidural abscess or collection. Epidural enhancement extends cephalad within the dorsal epidural space to the level of C2-3. There is angulation and mild perching of the C5-6 facets bilaterally due to focal kyphotic angulation. Mild edema and enhancement noted within the C5-6 facets as well. Vertebral body heights otherwise maintained. No other evidence for osteomyelitis discitis. Underlying bone marrow signal intensity diffusely decreased on T1 weighted imaging, most commonly related to anemia, smoking, or obesity. No discrete or worrisome osseous lesions. Cord: There is impingement of the cervical spinal cord with focal cord kinking at the level of C5-6 due to adjacent retrolisthesis of the C5 vertebral body and focal kyphotic angulation (series 25, image 9). No definite cord signal changes, although evaluation somewhat limited by motion artifact. Remainder of the cervical spinal cord otherwise normal in appearance. No other appreciable epidural collection or abscess. Posterior Fossa, vertebral arteries, paraspinal tissues: Visualized brain and posterior fossa within normal limits. Craniocervical junction normal. There is abnormal edema and enhancement throughout the posterior paraspinous musculature, left greater than right. Superimposed multifocal heterogeneous and irregular  soft tissue abscesses present within the left paraspinous soft tissues, extending from approximately C2-3 inferiorly through the upper thoracic spine. Largest collection seen at the level of C5 through C7 and measures 2.4 x 2.3 x 3.2 cm (transverse by AP by craniocaudad, series 37, image 16). Relative sparing of the prevertebral soft tissues. Normal intravascular flow voids seen within the vertebral arteries bilaterally. Disc levels: C2-C3: Negative. C3-C4: Enhancement within the dorsal epidural space. Otherwise negative. C4-C5: Enhancement within the dorsal epidural space. Minimal left eccentric disc bulge. Mild spinal stenosis, largely related to the kyphotic angulation inferiorly. Foramina remain patent. C5-C6: Changes consistent with progressive osteomyelitis discitis with loss of C5-6 interspace as well as C5 and C6 vertebral body height loss. Associated 4 mm retrolisthesis of C5 on C6 with focal kyphotic angulation. Superimposed circumferential epidural enhancement at this level. There is impingement of the cervical spinal cord with fairly severe spinal stenosis. Thecal sac measures AP diameter at its most narrow point. Moderate bilateral C6 foraminal narrowing. C6-C7: Minimal epidural enhancement. No significant disc pathology. No stenosis. C7-T1:  Unremarkable. MRI THORACIC SPINE FINDINGS Alignment: Vertebral bodies normally aligned with preservation of the normal thoracic kyphosis. No listhesis or subluxation. Vertebrae: Vertebral body heights maintained without evidence for acute or chronic fracture.  Bone marrow signal intensity diffusely decreased on T1 weighted imaging, most commonly related to anemia, smoking, or obesity. Small benign hemangioma noted within the T7 vertebral body. No other discrete or worrisome osseous lesions. No findings to suggest osteomyelitis for discitis. No definite evidence for septic arthritis. Cord: Signal intensity within the thoracic spinal cord within normal limits. No  significant epidural abscess or collection identified. Paraspinal and other soft tissues: Abnormal edema and enhancement with superimposed multifocal soft tissue abscesses present within the left posterior paraspinous musculature within the upper thoracic spine, extending inferiorly from the cervical spine as above. Changes extend to approximately the T4-5 level. Largest collection within the thoracic spine measures approximately 2.1 cm at the level of T2 (series 39, image 4). Multifocal nodular and patchy densities within the visualized lungs, most consistent with septic emboli. Suggestion of cavitation about several of these lesions. Disc levels: T12-L1: Disc bulge with superimposed shallow left paracentral disc protrusion mildly indents the ventral thecal sac. No significant stenosis. No other significant disc pathology within the thoracic spine. MRI LUMBAR SPINE FINDINGS Segmentation: Normal segmentation. Lowest well-formed disc labeled the L5-S1 level. 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 Alignment otherwise normal with preservation of the normal lumbar lordosis. Vertebrae: Vertebral body heights maintained without evidence for acute or chronic fracture. Bone marrow signal intensity diffusely decreased on T1 weighted imaging. No discrete or worrisome osseous lesions. No evidence for osteomyelitis discitis or septic arthritis within the lumbar spine. Conus medullaris: Extends to the T12-L1 level and appears normal. Paraspinal and other soft tissues: paraspinous soft tissues demonstrate no acute finding. Few small cysts noted within the right kidney. Visualized visceral structures otherwise grossly unremarkable. Disc levels: No significant findings seen through the L4-5 level. L5-S1: Chronic bilateral L5 pars defects with associated 5 mm spondylolisthesis. Associated broad posterior pseudo disc bulge with disc desiccation and intervertebral disc space narrowing.  No significant canal or lateral recess narrowing. Mild to moderate bilateral L5 foraminal stenosis due to slippage. IMPRESSION: 1. 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 Secondary impingement upon the cervical spinal cord at this level with resultant severe spinal stenosis. Neuro surgical consultation recommended. 2. Extensive paraspinous phlegmon and infection with superimposed abscesses involving the central and left posterior paraspinous musculature extending from C2-3 through T4-5 as above. 3. Multifocal nodular densities in opacities within the visualized lungs, highly suspicious for septic emboli. 4. Shallow left paracentral disc protrusion at T12-L1 without significant stenosis. 5. 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 Critical Value/emergent results were called by telephone at the time of interpretation on 03/03/2018 at 11:48 pm to Dr. Derrill Kay , who verbally acknowledged these results. Electronically Signed   By: Rise Mu M.D.   On: 03/03/2018 23:52   Mr Thoracic Spine W Wo Contrast  Result Date: 03/03/2018 CLINICAL DATA:  Initial evaluation for intraspinal abscess and granuloma. EXAM: MRI TOTAL SPINE WITHOUT AND WITH CONTRAST TECHNIQUE: Multisequence MR imaging of the spine from the cervical spine to the sacrum was performed prior to and following IV contrast administration for evaluation of spinal metastatic disease. CONTRAST:  5 cc of Gadavist. COMPARISON:  Prior MRI from 12/28/2017 and 11/14/2015. FINDINGS: MRI CERVICAL SPINE FINDINGS Alignment: Increased retrolisthesis of C5 on C6 now measuring  up to 4 mm. New focal kyphotic angulation of the cervical spine at this level. Mild  straightening seen throughout the remainder of the cervical spinal cord. Vertebrae: Progressive disc space narrowing, endplate irregularity, with vertebral body height loss at C5-6, consistent with progressive osteomyelitis discitis. There is persistent abnormal marrow edema and enhancement within the C5 and C6 vertebral bodies, consistent with persistent infection. Circumferential epidural enhancement seen at this level without frank epidural abscess or collection. Epidural enhancement extends cephalad within the dorsal epidural space to the level of C2-3. There is angulation and mild perching of the C5-6 facets bilaterally due to focal kyphotic angulation. Mild edema and enhancement noted within the C5-6 facets as well. Vertebral body heights otherwise maintained. No other evidence for osteomyelitis discitis. Underlying bone marrow signal intensity diffusely decreased on T1 weighted imaging, most commonly related to anemia, smoking, or obesity. No discrete or worrisome osseous lesions. Cord: There is impingement of the cervical spinal cord with focal cord kinking at the level of C5-6 due to adjacent retrolisthesis of the C5 vertebral body and focal kyphotic angulation (series 25, image 9). No definite cord signal changes, although evaluation somewhat limited by motion artifact. Remainder of the cervical spinal cord otherwise normal in appearance. No other appreciable epidural collection or abscess. Posterior Fossa, vertebral arteries, paraspinal tissues: Visualized brain and posterior fossa within normal limits. Craniocervical junction normal. There is abnormal edema and enhancement throughout the posterior paraspinous musculature, left greater than right. Superimposed multifocal heterogeneous and irregular soft tissue abscesses present within the left paraspinous soft tissues, extending from approximately C2-3 inferiorly through the upper thoracic spine. Largest collection seen at the level of C5 through C7 and  measures 2.4 x 2.3 x 3.2 cm (transverse by AP by craniocaudad, series 37, image 16). Relative sparing of the prevertebral soft tissues. Normal intravascular flow voids seen within the vertebral arteries bilaterally. Disc levels: C2-C3: Negative. C3-C4: Enhancement within the dorsal epidural space. Otherwise negative. C4-C5: Enhancement within the dorsal epidural space. Minimal left eccentric disc bulge. Mild spinal stenosis, largely related to the kyphotic angulation inferiorly. Foramina remain patent. C5-C6: Changes consistent with progressive osteomyelitis discitis with loss of C5-6 interspace as well as C5 and C6 vertebral body height loss. Associated 4 mm retrolisthesis of C5 on C6 with focal kyphotic angulation. Superimposed circumferential epidural enhancement at this level. There is impingement of the cervical spinal cord with fairly severe spinal stenosis. Thecal sac measures AP diameter at its most narrow point. Moderate bilateral C6 foraminal narrowing. C6-C7: Minimal epidural enhancement. No significant disc pathology. No stenosis. C7-T1:  Unremarkable. MRI THORACIC SPINE FINDINGS Alignment: Vertebral bodies normally aligned with preservation of the normal thoracic kyphosis. No listhesis or subluxation. Vertebrae: Vertebral body heights maintained without evidence for acute or chronic fracture. Bone marrow signal intensity diffusely decreased on T1 weighted imaging, most commonly related to anemia, smoking, or obesity. Small benign hemangioma noted within the T7 vertebral body. No other discrete or worrisome osseous lesions. No findings to suggest osteomyelitis for discitis. No definite evidence for septic arthritis. Cord: Signal intensity within the thoracic spinal cord within normal limits. No significant epidural abscess or collection identified. Paraspinal and other soft tissues: Abnormal edema and enhancement with superimposed multifocal soft tissue abscesses present within the left posterior  paraspinous musculature within the upper thoracic spine, extending inferiorly from the cervical spine as above. Changes extend to approximately the T4-5 level. Largest collection within the thoracic spine measures approximately 2.1 cm at the level of T2 (series 39, image 4).  Multifocal nodular and patchy densities within the visualized lungs, most consistent with septic emboli. Suggestion of cavitation about several of these lesions. Disc levels: T12-L1: Disc bulge with superimposed shallow left paracentral disc protrusion mildly indents the ventral thecal sac. No significant stenosis. No other significant disc pathology within the thoracic spine. MRI LUMBAR SPINE FINDINGS Segmentation: Normal segmentation. Lowest well-formed disc labeled the L5-S1 level. 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 Alignment otherwise normal with preservation of the normal lumbar lordosis. Vertebrae: Vertebral body heights maintained without evidence for acute or chronic fracture. Bone marrow signal intensity diffusely decreased on T1 weighted imaging. No discrete or worrisome osseous lesions. No evidence for osteomyelitis discitis or septic arthritis within the lumbar spine. Conus medullaris: Extends to the T12-L1 level and appears normal. Paraspinal and other soft tissues: paraspinous soft tissues demonstrate no acute finding. Few small cysts noted within the right kidney. Visualized visceral structures otherwise grossly unremarkable. Disc levels: No significant findings seen through the L4-5 level. L5-S1: Chronic bilateral L5 pars defects with associated 5 mm spondylolisthesis. Associated broad posterior pseudo disc bulge with disc desiccation and intervertebral disc space narrowing. No significant canal or lateral recess narrowing. Mild to moderate bilateral L5 foraminal stenosis due to slippage. IMPRESSION: 1. Progressive findings of osteomyelitis discitis at C5-6 with progressive  disc space and vertebral body height loss, with progressive 4 mm retrolisthesis of C5 on C6, resulting in focal kyphotic angulation of the cervical spine at this level. Secondary impingement upon the cervical spinal cord at this level with resultant severe spinal stenosis. Neuro surgical consultation recommended. 2. Extensive paraspinous phlegmon and infection with superimposed abscesses involving the central and left posterior paraspinous musculature extending from C2-3 through T4-5 as above. 3. Multifocal nodular densities in opacities within the visualized lungs, highly suspicious for septic emboli. 4. Shallow left paracentral disc protrusion at T12-L1 without significant stenosis. 5. 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 Critical Value/emergent results were called by telephone at the time of interpretation on 03/03/2018 at 11:48 pm to Dr. Derrill Kay , who verbally acknowledged these results. Electronically Signed   By: Rise Mu M.D.   On: 03/03/2018 23:52   Mr Lumbar Spine W Wo Contrast  Result Date: 03/03/2018 CLINICAL DATA:  Initial evaluation for intraspinal abscess and granuloma. EXAM: MRI TOTAL SPINE WITHOUT AND WITH CONTRAST TECHNIQUE: Multisequence MR imaging of the spine from the cervical spine to the sacrum was performed prior to and following IV contrast administration for evaluation of spinal metastatic disease. CONTRAST:  5 cc of Gadavist. COMPARISON:  Prior MRI from 12/28/2017 and 11/14/2015. FINDINGS: MRI CERVICAL SPINE FINDINGS Alignment: Increased retrolisthesis of C5 on C6 now measuring up to 4 mm. New focal kyphotic angulation of the cervical spine at this level. Mild straightening seen throughout the remainder of the cervical spinal cord. Vertebrae: Progressive disc space narrowing, endplate irregularity, with vertebral body height loss at C5-6, consistent with progressive  osteomyelitis discitis. There is persistent abnormal marrow edema and enhancement within the C5 and C6 vertebral bodies, consistent with persistent infection. Circumferential epidural enhancement seen at this level without frank epidural abscess or collection. Epidural enhancement extends cephalad within the dorsal epidural space to the level of C2-3. There is angulation and mild perching of the C5-6 facets bilaterally due to focal kyphotic angulation. Mild edema and enhancement noted within the C5-6 facets as well. Vertebral body heights otherwise maintained. No other evidence  for osteomyelitis discitis. Underlying bone marrow signal intensity diffusely decreased on T1 weighted imaging, most commonly related to anemia, smoking, or obesity. No discrete or worrisome osseous lesions. Cord: There is impingement of the cervical spinal cord with focal cord kinking at the level of C5-6 due to adjacent retrolisthesis of the C5 vertebral body and focal kyphotic angulation (series 25, image 9). No definite cord signal changes, although evaluation somewhat limited by motion artifact. Remainder of the cervical spinal cord otherwise normal in appearance. No other appreciable epidural collection or abscess. Posterior Fossa, vertebral arteries, paraspinal tissues: Visualized brain and posterior fossa within normal limits. Craniocervical junction normal. There is abnormal edema and enhancement throughout the posterior paraspinous musculature, left greater than right. Superimposed multifocal heterogeneous and irregular soft tissue abscesses present within the left paraspinous soft tissues, extending from approximately C2-3 inferiorly through the upper thoracic spine. Largest collection seen at the level of C5 through C7 and measures 2.4 x 2.3 x 3.2 cm (transverse by AP by [email or web address], series 37, image 16). Relative sparing of the prevertebral soft tissues. Normal intravascular flow voids seen within the vertebral arteries  bilaterally. Disc levels: C2-C3: Negative. C3-C4: Enhancement within the dorsal epidural space. Otherwise negative. C4-C5: Enhancement within the dorsal epidural space. Minimal left eccentric disc bulge. Mild spinal stenosis, largely related to the kyphotic angulation inferiorly. Foramina remain patent. C5-C6: Changes consistent with progressive osteomyelitis discitis with loss of C5-6 interspace as well as C5 and C6 vertebral body height loss. Associated 4 mm retrolisthesis of C5 on C6 with focal kyphotic angulation. Superimposed circumferential epidural enhancement at this level. There is impingement of the cervical spinal cord with fairly severe spinal stenosis. Thecal sac measures AP diameter at its most narrow point. Moderate bilateral C6 foraminal narrowing. C6-C7: Minimal epidural enhancement. No significant disc pathology. No stenosis. C7-T1:  Unremarkable. MRI THORACIC SPINE FINDINGS Alignment: Vertebral bodies normally aligned with preservation of the normal thoracic kyphosis. No listhesis or subluxation. Vertebrae: Vertebral body heights maintained without evidence for acute or chronic fracture. Bone marrow signal intensity diffusely decreased on T1 weighted imaging, most commonly related to anemia, smoking, or obesity. Small benign hemangioma noted within the T7 vertebral body. No other discrete or worrisome osseous lesions. No findings to suggest osteomyelitis for discitis. No definite evidence for septic arthritis. Cord: Signal intensity within the thoracic spinal cord within normal limits. No significant epidural abscess or collection identified. Paraspinal and other soft tissues: Abnormal edema and enhancement with superimposed multifocal soft tissue abscesses present within the left posterior paraspinous musculature within the upper thoracic spine, extending inferiorly from the cervical spine as above. Changes extend to approximately the T4-5 level. Largest collection within the thoracic spine  measures approximately 2.1 cm at the level of T2 (series 39, image 4). Multifocal nodular and patchy densities within the visualized lungs, most consistent with septic emboli. Suggestion of cavitation about several of these lesions. Disc levels: T12-L1: Disc bulge with superimposed shallow left paracentral disc protrusion mildly indents the ventral thecal sac. No significant stenosis. No other significant disc pathology within the thoracic spine. MRI LUMBAR SPINE FINDINGS Segmentation: Normal segmentation. Lowest well-formed disc labeled the L5-S1 level. 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 Alignment otherwise normal with preservation of the normal lumbar lordosis. Vertebrae: Vertebral body heights maintained without evidence for acute or chronic fracture. Bone marrow signal intensity diffusely decreased on T1 weighted imaging. No discrete or worrisome osseous lesions. No evidence for osteomyelitis discitis or septic arthritis within the lumbar  spine. Conus medullaris: Extends to the T12-L1 level and appears normal. Paraspinal and other soft tissues: paraspinous soft tissues demonstrate no acute finding. Few small cysts noted within the right kidney. Visualized visceral structures otherwise grossly unremarkable. Disc levels: No significant findings seen through the L4-5 level. L5-S1: Chronic bilateral L5 pars defects with associated 5 mm spondylolisthesis. Associated broad posterior pseudo disc bulge with disc desiccation and intervertebral disc space narrowing. No significant canal or lateral recess narrowing. Mild to moderate bilateral L5 foraminal stenosis due to slippage. IMPRESSION: 1. 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 Secondary impingement upon the cervical  spinal cord at this level with resultant severe spinal stenosis. Neuro surgical consultation recommended. 2. Extensive paraspinous phlegmon and infection with superimposed abscesses involving the central and left posterior paraspinous musculature extending from C2-3 through T4-5 as above. 3. Multifocal nodular densities in opacities within the visualized lungs, highly suspicious for septic emboli. 4. Shallow left paracentral disc protrusion at T12-L1 without significant stenosis. 5. 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 Critical Value/emergent results were called by telephone at the time of interpretation on 03/03/2018 at 11:48 pm to Dr. Derrill Kay , who verbally acknowledged these results. Electronically Signed   By: Rise Mu M.D.   On: 03/03/2018 23:52    EKG:   Orders placed or performed during the hospital encounter of 12/28/17  . EKG 12-Lead  . EKG 12-Lead  . ED EKG  . ED EKG  . EKG    IMPRESSION AND PLAN:  Principal Problem:   Sepsis (HCC) -due to her epidural abscess, IV antibiotics started, lactic acid was within normal limits, blood pressure stable.  Cultures sent Active Problems:   Opiate abuse, continuous (HCC) -avoid excessive narcotic use for this patient where possible, she requests significant medications tonight, many that are potential drugs of abuse.   Epidural abscess -IV antibiotics as above, neurosurgery consult  Chart review performed and case discussed with ED provider. Labs, imaging and/or ECG reviewed by provider and discussed with patient/family. Management plans discussed with the patient and/or family.  DVT PROPHYLAXIS: SubQ lovenox   GI PROPHYLAXIS:  None  ADMISSION STATUS: Inpatient     CODE STATUS: Full Code Status History    Date Active Date Inactive Code Status Order ID Comments User Context   12/28/2017 1331 01/11/2018 2106 Full Code  [id number]  Adrian Saran, MD Inpatient   09/20/2017 2027 09/24/2017 1255 Full Code [id number]  Rometta Emery, MD ED   11/13/2015 0614 11/20/2015 1845 Full Code [id number]  Ihor Austin, MD ED   04/28/2013 0742 04/29/2013 1615 Full Code [id number]  Almond Lint, MD Inpatient      TOTAL TIME TAKING CARE OF THIS PATIENT: 45 minutes.   Alani Sabbagh FIELDING 03/04/2018, 1:13 AM  [employer]  [phone]  CC: Primary care physician; Alberteen Spindle, CNM  Note:  This document was prepared using Dragon voice recognition software and may include unintentional dictation errors.

## 2018-03-04 NOTE — Progress Notes (Signed)
Pt received a bed at [facility]; Primary RN called and gave report to RN Lafonda Mosses with all questions answered. EMTALA form updated by Dr. Anne Hahn. Pt was transferred by [facility] with all questions answered.

## 2018-03-04 NOTE — Progress Notes (Signed)
Patient ID: Andrea King, female   DOB: 01-05-1976, 42 y.o.   MRN: [id number] Spoke with Hospitalist Dr Kyla Balzarine and she accepted the pt.  Once pt is stbale and if no longer requires to be at [facility] pt will be transferred back to [facility]

## 2018-03-04 NOTE — Progress Notes (Signed)
   Patient ID: Andrea King, female   DOB: 1976-01-26, 42 y.o.   MRN: [id number] Transfer center called regarding pt's insurance status. Pt is currently listed as Medicaid potential. [facility] transfer center wanted information about the status. I told them I will have to call CM to look into it. Alan Mulder CM--she recommended RN call financial advisor at [employer] said pt has not applied for medicaid and will likely not qualify for it.  Spoke with dr Marcell Barlow to assist with pt transfer and he [facility] and let me know that pt will need transfer to [facility] but that I do it thru the hospitalist.  Awaiting for [facility] to call me.

## 2018-03-04 NOTE — Consult Note (Signed)
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 Corrected calcium is 9.6. Will enter potassium phosphate 15 mmol IV x1, which will provide potassium. Pharmacy will follow up in the morning and replace again if needed.  Burnis Medin, PharmD Clinical Pharmacist

## 2018-03-04 NOTE — ED Notes (Signed)
Pt ringing the bell and this RN to bedside. Pt is at the end of the bed, bent over moaning and states she is trying to turn on the TV. Pt is altered due to the medications and unable to follow directions. This RN yells for help and another RN assist lifting the pt back in bed. All belongings removed from the bed and pt is encouraged to go to sleep. Pt asking for more drinks. Pt has 7 drinks on her bedside table. This RN encouraged this pt to go to sleep.

## 2018-03-04 NOTE — Progress Notes (Signed)
Awaiting for bed availability. Pt. Will be transferred via [facility].

## 2018-03-04 NOTE — ED Notes (Signed)
Pt lying supine in bed looking at her cell phone and moaning. Pt says she is in severe pain. Pt states she needs 2mg  of dilaudid. Explained to pt that the mD is not going to give her 2mg  of dilaudid and this RN will not be giving 2mg  of dilaudid. Pt was given 1mg  of dilaudid earlier. Pt now given haldol and ativan to help her rest. Pt asking for something to "knock her out". Explained to pt that I am trying to give her medication to allow her to rest and make her sleepy. Pt states again that she already told us that 1mg  of dilaudid was not enough. Pt is slurring her words and is not able to pay attention. Pt is unwilling to rest and is pushing through the medication effects. MD notified.

## 2018-03-04 NOTE — ED Provider Notes (Signed)
MRI does show progression of patient's disease.  Discussed with Dr. Marcell Barlow with neurosurgery who reviewed the images.  Will plan on admission at this facility.  Will continue IV antibiotics.  Neurosurgery will evaluate tomorrow.   Phineas Semen, MD 03/04/18 [phone]

## 2018-03-04 NOTE — Progress Notes (Addendum)
[facility] - [place] at [facility]   PATIENT NAME: Andrea King    MR#:  [id number]  DATE OF BIRTH:  December 03, 1975  SUBJECTIVE:  Came in with severe neck and back pain. hallering with pain.  She tells me she has not used IVDA recently ?reliability  REVIEW OF SYSTEMS:   Review of Systems  Constitutional: Negative for chills, fever and weight loss.  HENT: Negative for ear discharge, ear pain and nosebleeds.   Eyes: Negative for blurred vision, pain and discharge.  Respiratory: Negative for sputum production, shortness of breath, wheezing and stridor.   Cardiovascular: Negative for chest pain, palpitations, orthopnea and PND.  Gastrointestinal: Negative for abdominal pain, diarrhea, nausea and vomiting.  Genitourinary: Negative for frequency and urgency.  Musculoskeletal: Positive for back pain, joint pain and neck pain.  Neurological: Positive for weakness. Negative for sensory change, speech change and focal weakness.  Psychiatric/Behavioral: Negative for depression and hallucinations. The patient is not nervous/anxious.    Tolerating Diet: very little Tolerating PT:   DRUG ALLERGIES:  No Known Allergies  VITALS:  Blood pressure (!) 99/55, pulse (!) 130, temperature 99.4 F (37.4 C), temperature source Oral, resp. rate (!) 28, height 5\' 2"  (1.575 m), weight 50.6 kg, SpO2 97 %.  PHYSICAL EXAMINATION:   Physical Exam  GENERAL:  42 y.o.-year-old patient lying in the bed with no acute distress. Appears chronically ill, thin cachectic EYES: Pupils equal, round, reactive to light and accommodation. No scleral icterus. Extraocular muscles intact.  HEENT: Head atraumatic, normocephalic. Oropharynx and nasopharynx clear. Neck collar + NECK:  Supple, no jugular venous distention. No thyroid enlargement, no tenderness.  LUNGS:distant breath sounds bilaterally, no wheezing, rales, rhonchi. No use of accessory muscles of respiration.  CARDIOVASCULAR: S1, S2  normal. No murmurs, rubs, or gallops. tachycardia ABDOMEN: Soft, nontender, nondistended. Bowel sounds present. No organomegaly or mass.  EXTREMITIES: No cyanosis, clubbing or edema b/l.    NEUROLOGIC: Cranial nerves II through XII are intact. No focal Motor  Mild sensory deficits both UE more on right posterior part of her arm. Still able to move all extremities well Limited exam sice pt is in lot of pain   PSYCHIATRIC:  patient is alert and oriented x 3.  SKIN: No obvious rash, lesion, or ulcer.   LABORATORY PANEL:  CBC Recent Labs  Lab 03/03/18 1709  WBC 28.3*  HGB 10.9*  HCT 32.3*  PLT 168    Chemistries  Recent Labs  Lab 03/03/18 1709  NA 128*  K 3.1*  CL 94*  CO2 22  GLUCOSE 161*  BUN 37*  CREATININE 1.31*  CALCIUM 8.5*  AST 26  ALT 11  ALKPHOS 144*  BILITOT 0.5   Cardiac Enzymes No results for input(s): TROPONINI in the last 168 hours. RADIOLOGY:  Dg Chest 1 View  Result Date: 03/03/2018 CLINICAL DATA:  Possible pneumonia. Difficulty breathing with fever. EXAM: CHEST  1 VIEW COMPARISON:  09/20/2017 FINDINGS: Lungs are adequately inflated with mild patchy bilateral airspace opacification with some nodular areas which may be due to infection. No effusion. Cardiomediastinal silhouette and remainder of the exam is unchanged. IMPRESSION: Subtle patchy multifocal airspace process with mild nodularity likely infection. Recommend follow-up chest radiograph 4 weeks. Electronically Signed   By: Elberta Fortis M.D.   On: 03/03/2018 18:59   Mr Cervical Spine W Or Wo Contrast  Result Date: 03/03/2018 CLINICAL DATA:  Initial evaluation for intraspinal abscess and granuloma. EXAM: MRI TOTAL SPINE WITHOUT AND WITH CONTRAST TECHNIQUE: Multisequence  MR imaging of the spine from the cervical spine to the sacrum was performed prior to and following IV contrast administration for evaluation of spinal metastatic disease. CONTRAST:  5 cc of Gadavist. COMPARISON:  Prior MRI from  12/28/2017 and 11/14/2015. FINDINGS: MRI CERVICAL SPINE FINDINGS Alignment: Increased retrolisthesis of C5 on C6 now measuring up to 4 mm. New focal kyphotic angulation of the cervical spine at this level. Mild straightening seen throughout the remainder of the cervical spinal cord. Vertebrae: Progressive disc space narrowing, endplate irregularity, with vertebral body height loss at C5-6, consistent with progressive osteomyelitis discitis. There is persistent abnormal marrow edema and enhancement within the C5 and C6 vertebral bodies, consistent with persistent infection. Circumferential epidural enhancement seen at this level without frank epidural abscess or collection. Epidural enhancement extends cephalad within the dorsal epidural space to the level of C2-3. There is angulation and mild perching of the C5-6 facets bilaterally due to focal kyphotic angulation. Mild edema and enhancement noted within the C5-6 facets as well. Vertebral body heights otherwise maintained. No other evidence for osteomyelitis discitis. Underlying bone marrow signal intensity diffusely decreased on T1 weighted imaging, most commonly related to anemia, smoking, or obesity. No discrete or worrisome osseous lesions. Cord: There is impingement of the cervical spinal cord with focal cord kinking at the level of C5-6 due to adjacent retrolisthesis of the C5 vertebral body and focal kyphotic angulation (series 25, image 9). No definite cord signal changes, although evaluation somewhat limited by motion artifact. Remainder of the cervical spinal cord otherwise normal in appearance. No other appreciable epidural collection or abscess. Posterior Fossa, vertebral arteries, paraspinal tissues: Visualized brain and posterior fossa within normal limits. Craniocervical junction normal. There is abnormal edema and enhancement throughout the posterior paraspinous musculature, left greater than right. Superimposed multifocal heterogeneous and irregular  soft tissue abscesses present within the left paraspinous soft tissues, extending from approximately C2-3 inferiorly through the upper thoracic spine. Largest collection seen at the level of C5 through C7 and measures 2.4 x 2.3 x 3.2 cm (transverse by AP by craniocaudad, series 37, image 16). Relative sparing of the prevertebral soft tissues. Normal intravascular flow voids seen within the vertebral arteries bilaterally. Disc levels: C2-C3: Negative. C3-C4: Enhancement within the dorsal epidural space. Otherwise negative. C4-C5: Enhancement within the dorsal epidural space. Minimal left eccentric disc bulge. Mild spinal stenosis, largely related to the kyphotic angulation inferiorly. Foramina remain patent. C5-C6: Changes consistent with progressive osteomyelitis discitis with loss of C5-6 interspace as well as C5 and C6 vertebral body height loss. Associated 4 mm retrolisthesis of C5 on C6 with focal kyphotic angulation. Superimposed circumferential epidural enhancement at this level. There is impingement of the cervical spinal cord with fairly severe spinal stenosis. Thecal sac measures AP diameter at its most narrow point. Moderate bilateral C6 foraminal narrowing. C6-C7: Minimal epidural enhancement. No significant disc pathology. No stenosis. C7-T1:  Unremarkable. MRI THORACIC SPINE FINDINGS Alignment: Vertebral bodies normally aligned with preservation of the normal thoracic kyphosis. No listhesis or subluxation. Vertebrae: Vertebral body heights maintained without evidence for acute or chronic fracture. Bone marrow signal intensity diffusely decreased on T1 weighted imaging, most commonly related to anemia, smoking, or obesity. Small benign hemangioma noted within the T7 vertebral body. No other discrete or worrisome osseous lesions. No findings to suggest osteomyelitis for discitis. No definite evidence for septic arthritis. Cord: Signal intensity within the thoracic spinal cord within normal limits. No  significant epidural abscess or collection identified. Paraspinal and other soft tissues: Abnormal  edema and enhancement with superimposed multifocal soft tissue abscesses present within the left posterior paraspinous musculature within the upper thoracic spine, extending inferiorly from the cervical spine as above. Changes extend to approximately the T4-5 level. Largest collection within the thoracic spine measures approximately 2.1 cm at the level of T2 (series 39, image 4). Multifocal nodular and patchy densities within the visualized lungs, most consistent with septic emboli. Suggestion of cavitation about several of these lesions. Disc levels: T12-L1: Disc bulge with superimposed shallow left paracentral disc protrusion mildly indents the ventral thecal sac. No significant stenosis. No other significant disc pathology within the thoracic spine. MRI LUMBAR SPINE FINDINGS Segmentation: Normal segmentation. Lowest well-formed disc labeled the L5-S1 level. 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 Alignment otherwise normal with preservation of the normal lumbar lordosis. Vertebrae: Vertebral body heights maintained without evidence for acute or chronic fracture. Bone marrow signal intensity diffusely decreased on T1 weighted imaging. No discrete or worrisome osseous lesions. No evidence for osteomyelitis discitis or septic arthritis within the lumbar spine. Conus medullaris: Extends to the T12-L1 level and appears normal. Paraspinal and other soft tissues: paraspinous soft tissues demonstrate no acute finding. Few small cysts noted within the right kidney. Visualized visceral structures otherwise grossly unremarkable. Disc levels: No significant findings seen through the L4-5 level. L5-S1: Chronic bilateral L5 pars defects with associated 5 mm spondylolisthesis. Associated broad posterior pseudo disc bulge with disc desiccation and intervertebral disc space narrowing.  No significant canal or lateral recess narrowing. Mild to moderate bilateral L5 foraminal stenosis due to slippage. IMPRESSION: 1. 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 Secondary impingement upon the cervical spinal cord at this level with resultant severe spinal stenosis. Neuro surgical consultation recommended. 2. Extensive paraspinous phlegmon and infection with superimposed abscesses involving the central and left posterior paraspinous musculature extending from C2-3 through T4-5 as above. 3. Multifocal nodular densities in opacities within the visualized lungs, highly suspicious for septic emboli. 4. Shallow left paracentral disc protrusion at T12-L1 without significant stenosis. 5. 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 Critical Value/emergent results were called by telephone at the time of interpretation on 03/03/2018 at 11:48 pm to Dr. Derrill Kay , who verbally acknowledged these results. Electronically Signed   By: Rise Mu M.D.   On: 03/03/2018 23:52   Mr Thoracic Spine W Wo Contrast  Result Date: 03/03/2018 CLINICAL DATA:  Initial evaluation for intraspinal abscess and granuloma. EXAM: MRI TOTAL SPINE WITHOUT AND WITH CONTRAST TECHNIQUE: Multisequence MR imaging of the spine from the cervical spine to the sacrum was performed prior to and following IV contrast administration for evaluation of spinal metastatic disease. CONTRAST:  5 cc of Gadavist. COMPARISON:  Prior MRI from 12/28/2017 and 11/14/2015. FINDINGS: MRI CERVICAL SPINE FINDINGS Alignment: Increased retrolisthesis of C5 on C6 now measuring up to 4 mm. New focal kyphotic angulation of the cervical spine at this level. Mild  straightening seen throughout the remainder of the cervical spinal cord. Vertebrae: Progressive disc space narrowing, endplate irregularity, with vertebral body height loss at C5-6, consistent with progressive osteomyelitis discitis. There is persistent abnormal marrow edema and enhancement within the C5 and C6 vertebral bodies, consistent with persistent infection. Circumferential epidural enhancement seen at this level  without frank epidural abscess or collection. Epidural enhancement extends cephalad within the dorsal epidural space to the level of C2-3. There is angulation and mild perching of the C5-6 facets bilaterally due to focal kyphotic angulation. Mild edema and enhancement noted within the C5-6 facets as well. Vertebral body heights otherwise maintained. No other evidence for osteomyelitis discitis. Underlying bone marrow signal intensity diffusely decreased on T1 weighted imaging, most commonly related to anemia, smoking, or obesity. No discrete or worrisome osseous lesions. Cord: There is impingement of the cervical spinal cord with focal cord kinking at the level of C5-6 due to adjacent retrolisthesis of the C5 vertebral body and focal kyphotic angulation (series 25, image 9). No definite cord signal changes, although evaluation somewhat limited by motion artifact. Remainder of the cervical spinal cord otherwise normal in appearance. No other appreciable epidural collection or abscess. Posterior Fossa, vertebral arteries, paraspinal tissues: Visualized brain and posterior fossa within normal limits. Craniocervical junction normal. There is abnormal edema and enhancement throughout the posterior paraspinous musculature, left greater than right. Superimposed multifocal heterogeneous and irregular soft tissue abscesses present within the left paraspinous soft tissues, extending from approximately C2-3 inferiorly through the upper thoracic spine. Largest collection seen at the level of C5 through C7 and  measures 2.4 x 2.3 x 3.2 cm (transverse by AP by craniocaudad, series 37, image 16). Relative sparing of the prevertebral soft tissues. Normal intravascular flow voids seen within the vertebral arteries bilaterally. Disc levels: C2-C3: Negative. C3-C4: Enhancement within the dorsal epidural space. Otherwise negative. C4-C5: Enhancement within the dorsal epidural space. Minimal left eccentric disc bulge. Mild spinal stenosis, largely related to the kyphotic angulation inferiorly. Foramina remain patent. C5-C6: Changes consistent with progressive osteomyelitis discitis with loss of C5-6 interspace as well as C5 and C6 vertebral body height loss. Associated 4 mm retrolisthesis of C5 on C6 with focal kyphotic angulation. Superimposed circumferential epidural enhancement at this level. There is impingement of the cervical spinal cord with fairly severe spinal stenosis. Thecal sac measures AP diameter at its most narrow point. Moderate bilateral C6 foraminal narrowing. C6-C7: Minimal epidural enhancement. No significant disc pathology. No stenosis. C7-T1:  Unremarkable. MRI THORACIC SPINE FINDINGS Alignment: Vertebral bodies normally aligned with preservation of the normal thoracic kyphosis. No listhesis or subluxation. Vertebrae: Vertebral body heights maintained without evidence for acute or chronic fracture. Bone marrow signal intensity diffusely decreased on T1 weighted imaging, most commonly related to anemia, smoking, or obesity. Small benign hemangioma noted within the T7 vertebral body. No other discrete or worrisome osseous lesions. No findings to suggest osteomyelitis for discitis. No definite evidence for septic arthritis. Cord: Signal intensity within the thoracic spinal cord within normal limits. No significant epidural abscess or collection identified. Paraspinal and other soft tissues: Abnormal edema and enhancement with superimposed multifocal soft tissue abscesses present within the left posterior  paraspinous musculature within the upper thoracic spine, extending inferiorly from the cervical spine as above. Changes extend to approximately the T4-5 level. Largest collection within the thoracic spine measures approximately 2.1 cm at the level of T2 (series 39, image 4). Multifocal nodular and patchy densities within the visualized lungs, most consistent with septic emboli. Suggestion of cavitation about several of these lesions. Disc levels: T12-L1: Disc bulge with superimposed shallow left paracentral disc protrusion mildly indents the ventral thecal sac. No significant stenosis. No other significant disc pathology within the thoracic spine. MRI LUMBAR SPINE FINDINGS Segmentation: Normal segmentation. Lowest well-formed disc labeled the L5-S1 level. Alignment: Chronic bilateral pars defects at  L5 with associated 5 mm spondylolisthesis. Alignment otherwise normal with preservation of the normal lumbar lordosis. Vertebrae: Vertebral body heights maintained without evidence for acute or chronic fracture. Bone marrow signal intensity diffusely decreased on T1 weighted imaging. No discrete or worrisome osseous lesions. No evidence for osteomyelitis discitis or septic arthritis within the lumbar spine. Conus medullaris: Extends to the T12-L1 level and appears normal. Paraspinal and other soft tissues: paraspinous soft tissues demonstrate no acute finding. Few small cysts noted within the right kidney. Visualized visceral structures otherwise grossly unremarkable. Disc levels: No significant findings seen through the L4-5 level. L5-S1: Chronic bilateral L5 pars defects with associated 5 mm spondylolisthesis. Associated broad posterior pseudo disc bulge with disc desiccation and intervertebral disc space narrowing. No significant canal or lateral recess narrowing. Mild to moderate bilateral L5 foraminal stenosis due to slippage. IMPRESSION: 1. Progressive findings of osteomyelitis discitis at C5-6 with progressive  disc space and vertebral body height loss, with progressive 4 mm retrolisthesis of C5 on C6, resulting in focal kyphotic angulation of the cervical spine at this level. Secondary impingement upon the cervical spinal cord at this level with resultant severe spinal stenosis. Neuro surgical consultation recommended. 2. Extensive paraspinous phlegmon and infection with superimposed abscesses involving the central and left posterior paraspinous musculature extending from C2-3 through T4-5 as above. 3. Multifocal nodular densities in opacities within the visualized lungs, highly suspicious for septic emboli. 4. Shallow left paracentral disc protrusion at T12-L1 without significant stenosis. 5. 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 Critical Value/emergent results were called by telephone at the time of interpretation on 03/03/2018 at 11:48 pm to Dr. Derrill Kay , who verbally acknowledged these results. Electronically Signed   By: Rise Mu M.D.   On: 03/03/2018 23:52   Mr Lumbar Spine W Wo Contrast  Result Date: 03/03/2018 CLINICAL DATA:  Initial evaluation for intraspinal abscess and granuloma. EXAM: MRI TOTAL SPINE WITHOUT AND WITH CONTRAST TECHNIQUE: Multisequence MR imaging of the spine from the cervical spine to the sacrum was performed prior to and following IV contrast administration for evaluation of spinal metastatic disease. CONTRAST:  5 cc of Gadavist. COMPARISON:  Prior MRI from 12/28/2017 and 11/14/2015. FINDINGS: MRI CERVICAL SPINE FINDINGS Alignment: Increased retrolisthesis of C5 on C6 now measuring up to 4 mm. New focal kyphotic angulation of the cervical spine at this level. Mild straightening seen throughout the remainder of the cervical spinal cord. Vertebrae: Progressive disc space narrowing, endplate irregularity, with vertebral body height loss at C5-6, consistent with progressive  osteomyelitis discitis. There is persistent abnormal marrow edema and enhancement within the C5 and C6 vertebral bodies, consistent with persistent infection. Circumferential epidural enhancement seen at this level without frank epidural abscess or collection. Epidural enhancement extends cephalad within the dorsal epidural space to the level of C2-3. There is angulation and mild perching of the C5-6 facets bilaterally due to focal kyphotic angulation. Mild edema and enhancement noted within the C5-6 facets as well. Vertebral body heights otherwise maintained. No other evidence for osteomyelitis discitis. Underlying bone marrow signal intensity diffusely decreased on T1 weighted imaging, most commonly related to anemia, smoking, or obesity. No discrete or worrisome osseous lesions. Cord: There is impingement of the cervical spinal cord with focal cord kinking at the level of C5-6 due to adjacent retrolisthesis of the C5 vertebral body and focal kyphotic angulation (series 25, image 9). No definite cord signal changes, although evaluation somewhat limited  by motion artifact. Remainder of the cervical spinal cord otherwise normal in appearance. No other appreciable epidural collection or abscess. Posterior Fossa, vertebral arteries, paraspinal tissues: Visualized brain and posterior fossa within normal limits. Craniocervical junction normal. There is abnormal edema and enhancement throughout the posterior paraspinous musculature, left greater than right. Superimposed multifocal heterogeneous and irregular soft tissue abscesses present within the left paraspinous soft tissues, extending from approximately C2-3 inferiorly through the upper thoracic spine. Largest collection seen at the level of C5 through C7 and measures 2.4 x 2.3 x 3.2 cm (transverse by AP by craniocaudad, series 37, image 16). Relative sparing of the prevertebral soft tissues. Normal intravascular flow voids seen within the vertebral arteries  bilaterally. Disc levels: C2-C3: Negative. C3-C4: Enhancement within the dorsal epidural space. Otherwise negative. C4-C5: Enhancement within the dorsal epidural space. Minimal left eccentric disc bulge. Mild spinal stenosis, largely related to the kyphotic angulation inferiorly. Foramina remain patent. C5-C6: Changes consistent with progressive osteomyelitis discitis with loss of C5-6 interspace as well as C5 and C6 vertebral body height loss. Associated 4 mm retrolisthesis of C5 on C6 with focal kyphotic angulation. Superimposed circumferential epidural enhancement at this level. There is impingement of the cervical spinal cord with fairly severe spinal stenosis. Thecal sac measures AP diameter at its most narrow point. Moderate bilateral C6 foraminal narrowing. C6-C7: Minimal epidural enhancement. No significant disc pathology. No stenosis. C7-T1:  Unremarkable. MRI THORACIC SPINE FINDINGS Alignment: Vertebral bodies normally aligned with preservation of the normal thoracic kyphosis. No listhesis or subluxation. Vertebrae: Vertebral body heights maintained without evidence for acute or chronic fracture. Bone marrow signal intensity diffusely decreased on T1 weighted imaging, most commonly related to anemia, smoking, or obesity. Small benign hemangioma noted within the T7 vertebral body. No other discrete or worrisome osseous lesions. No findings to suggest osteomyelitis for discitis. No definite evidence for septic arthritis. Cord: Signal intensity within the thoracic spinal cord within normal limits. No significant epidural abscess or collection identified. Paraspinal and other soft tissues: Abnormal edema and enhancement with superimposed multifocal soft tissue abscesses present within the left posterior paraspinous musculature within the upper thoracic spine, extending inferiorly from the cervical spine as above. Changes extend to approximately the T4-5 level. Largest collection within the thoracic spine  measures approximately 2.1 cm at the level of T2 (series 39, image 4). Multifocal nodular and patchy densities within the visualized lungs, most consistent with septic emboli. Suggestion of cavitation about several of these lesions. Disc levels: T12-L1: Disc bulge with superimposed shallow left paracentral disc protrusion mildly indents the ventral thecal sac. No significant stenosis. No other significant disc pathology within the thoracic spine. MRI LUMBAR SPINE FINDINGS Segmentation: Normal segmentation. Lowest well-formed disc labeled the L5-S1 level. 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 Alignment otherwise normal with preservation of the normal lumbar lordosis. Vertebrae: Vertebral body heights maintained without evidence for acute or chronic fracture. Bone marrow signal intensity diffusely decreased on T1 weighted imaging. No discrete or worrisome osseous lesions. No evidence for osteomyelitis discitis or septic arthritis within the lumbar spine. Conus medullaris: Extends to the T12-L1 level and appears normal. Paraspinal and other soft tissues: paraspinous soft tissues demonstrate no acute finding. Few small cysts noted within the right kidney. Visualized visceral structures otherwise grossly unremarkable. Disc levels: No significant findings seen through the L4-5 level. L5-S1: Chronic bilateral L5 pars defects with associated 5 mm spondylolisthesis. Associated broad posterior pseudo disc bulge with disc desiccation and intervertebral disc space narrowing.  No significant canal or lateral recess narrowing. Mild to moderate bilateral L5 foraminal stenosis due to slippage. IMPRESSION: 1. 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 Secondary impingement upon the cervical  spinal cord at this level with resultant severe spinal stenosis. Neuro surgical consultation recommended. 2. Extensive paraspinous phlegmon and infection with superimposed abscesses involving the central and left posterior paraspinous musculature extending from C2-3 through T4-5 as above. 3. Multifocal nodular densities in opacities within the visualized lungs, highly suspicious for septic emboli. 4. Shallow left paracentral disc protrusion at T12-L1 without significant stenosis. 5. 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 Critical Value/emergent results were called by telephone at the time of interpretation on 03/03/2018 at 11:48 pm to Dr. Derrill Kay , who verbally acknowledged these results. Electronically Signed   By: Rise Mu M.D.   On: 03/03/2018 23:52   ASSESSMENT AND PLAN:   Andrea King  is a 42 y.o. female who presents with chief complaint as above.  Patient presents with worsening symptoms of subjective fever, tingling and numbness in her right hand and left leg.  She was admitted here a few months ago with bacteremia, and then last month with an epidural abscess in her neck.  She left AMA after few weeks in the hospital.   * MRSA Sepsis (HCC) -due to her epidural abscess -blood culture positive for grams positive cocci. BC ID shows M RSA -IV vancomycin and cefepime -ID consultation placed -transthoracic echocardiogram. -Will need TEE -patient has history of tricuspid valvular plasty in 2017 at [facility]. Patient left AMA at that time  And did not finish antibiotics. She has BC postive for strept sanginous -she left AMA on 910 2019--- blood cultures were positive for Serratia. She was getting treated for cervical discitis  *Drug abuse-- IV -present tells me she is not done IV drug use since her September admission -urine drug screen pending  *Severe neck pain secondary to epidural  abscess cervical and thoracic -seen by neurosurgery PA--- patient may need CT guided epidural abscess drainage. She will discussed with Dr. Marcell Barlow -PRN IV Dilaudid, oral oxycodone, fentanyl patch added  *Hyponatremia, hypokalemia, acute renal failure secondary to poor PO intake -IV fluids  *Malnutrition severe in the context of chronic infection -dietitian consultation  *Leukocytosis due to epidural abscess   No family in the room   Case discussed with Care Management/Social Worker. Management plans discussed with the patient, family and they are in agreement.  CODE STATUS: full DVT Prophylaxis: Lovenox  TOTAL critical TIME TAKING CARE OF THIS PATIENT: *30* minutes.  >50% time spent on counselling and coordination of care  POSSIBLE D/C IN *??* DAYS, DEPENDING ON CLINICAL CONDITION.  Note: This dictation was prepared with Dragon dictation along with smaller phrase technology. Any transcriptional errors that result from this process are unintentional.  [name] M.D on 03/04/2018 at 9:51 AM  Between 7am to 6pm - Pager - [phone]  After 6pm go to [email or web address] - password [email or web address]  [facility]  Office  [phone]  CC: Primary care physician; Alberteen Spindle, CNMPatient ID: [name], female   DOB: [birth year], 42 y.o.   MRN: [id number]

## 2018-03-04 NOTE — Progress Notes (Signed)
Assessed patient and see no evidence of bed bugs.  Andrea King 03/04/2018  4:11 AM

## 2018-03-04 NOTE — Progress Notes (Signed)
Patient ID: Andrea King, female   DOB: [birth year], 42 y.o.   MRN: [id number] spoke with neurosurgery PA Ivar Drape. Discussed with Dr. Marcell Barlow and patient will best serve at tertiary care center. Patient will be transferred to [facility] care center. Spoke with [facility] transfer center who is trying to get in touch with neurosurgery on call for me to discuss and possible transfer  I did inform patient's and [name] about transfer. Patient also is aware of transfer.

## 2018-03-04 NOTE — ED Notes (Signed)
ED TO INPATIENT HANDOFF REPORT  Name/Age/Gender Andrea King 42 y.o. female  Code Status Code Status History    Date Active Date Inactive Code Status Order ID Comments User Context   12/28/2017 1331 01/11/2018 2106 Full Code [id number]  Bettey Costa, MD Inpatient   09/20/2017 2027 09/24/2017 1255 Full Code [id number]  Elwyn Reach, MD ED   11/13/2015 0614 11/20/2015 1845 Full Code [id number]  Saundra Shelling, MD ED   04/28/2013 0742 04/29/2013 1615 Full Code [id number]  Stark Klein, MD Inpatient      Home/SNF/Other Home  Chief Complaint neck/back pain  Level of Care/Admitting Diagnosis ED Disposition    ED Disposition Condition [facility] Area: [place] [100120]  Level of Care: Med-Surg [16]  Diagnosis: Sepsis [facility]) [[id number]]  Admitting Physician: Lance Coon [[id number]]  Attending Physician: Lance Coon [phone]  Estimated length of stay: past midnight tomorrow  Certification:: I certify this patient will need inpatient services for at least 2 midnights  PT Class (Do Not Modify): Inpatient [101]  PT Acc Code (Do Not Modify): Private [1]       Medical History Past Medical History:  Diagnosis Date  . Anemia   . Asthma   . DJD (degenerative joint disease)   . Hepatitis C   . Substance abuse ([place])     Allergies No Known Allergies  IV Location/Drains/Wounds Patient Lines/Drains/Airways Status   Active Line/Drains/Airways    Name:   Placement date:   Placement time:   Site:   Days:   Peripheral IV 03/03/18 Left Arm   03/03/18    1838    Arm   1          Labs/Imaging Results for orders placed or performed during the hospital encounter of 03/03/18 (from the past 48 hour(s))  Comprehensive metabolic panel     Status: Abnormal   Collection Time: 03/03/18  5:09 PM  Result Value Ref Range   Sodium 128 (L) 135 - 145 mmol/L   Potassium 3.1 (L) 3.5 - 5.1 mmol/L   Chloride 94 (L) 98 - 111 mmol/L   CO2 22 22 - 32 mmol/L    Glucose, Bld 161 (H) 70 - 99 mg/dL   BUN 37 (H) 6 - 20 mg/dL   Creatinine, Ser 1.31 (H) 0.44 - 1.00 mg/dL   Calcium 8.5 (L) 8.9 - 10.3 mg/dL   Total Protein 7.2 6.5 - 8.1 g/dL   Albumin 2.6 (L) 3.5 - 5.0 g/dL   AST 26 15 - 41 U/L   ALT 11 0 - 44 U/L   Alkaline Phosphatase 144 (H) 38 - 126 U/L   Total Bilirubin 0.5 0.3 - 1.2 mg/dL   GFR calc non Af Amer 49 (L) >60 mL/min   GFR calc Af Amer 56 (L) >60 mL/min    Comment: (NOTE) The eGFR has been calculated using the CKD EPI equation. This calculation has not been validated in all clinical situations. eGFR's persistently <60 mL/min signify possible Chronic Kidney Disease.    Anion gap 12 5 - 15    Comment: Performed at [facility], [address] [place], [place] [address]  CBC with Differential     Status: Abnormal   Collection Time: 03/03/18  5:09 PM  Result Value Ref Range   WBC 28.3 (H) 4.0 - 10.5 K/uL    Comment: WHITE COUNT CONFIRMED ON SMEAR   RBC 4.27 3.87 - 5.11 MIL/uL   Hemoglobin 10.9 (L) 12.0 - 15.0  g/dL   HCT 32.3 (L) 36.0 - 46.0 %   MCV 75.6 (L) 80.0 - 100.0 fL   MCH 25.5 (L) 26.0 - 34.0 pg   MCHC 33.7 30.0 - 36.0 g/dL   RDW 16.4 (H) 11.5 - 15.5 %   Platelets 168 150 - 400 K/uL   nRBC 0.0 0.0 - 0.2 %   Neutrophils Relative % 84 %   Neutro Abs 23.7 (H) 1.7 - 7.7 K/uL   Lymphocytes Relative 4 %   Lymphs Abs 1.1 0.7 - 4.0 K/uL   Monocytes Relative 6 %   Monocytes Absolute 1.7 (H) 0.1 - 1.0 K/uL   Eosinophils Relative 0 %   Eosinophils Absolute 0.1 0.0 - 0.5 K/uL   Basophils Relative 1 %   Basophils Absolute 0.2 (H) 0.0 - 0.1 K/uL   Immature Granulocytes 5 %   Abs Immature Granulocytes 1.52 (H) 0.00 - 0.07 K/uL    Comment: Performed at [facility], [address] [place], [place] [address]  Lactic acid, plasma     Status: Abnormal   Collection Time: 03/03/18  6:12 PM  Result Value Ref Range   Lactic Acid, Venous 2.3 (HH) 0.5 - 1.9 mmol/L    Comment: CRITICAL RESULT CALLED TO, READ BACK  BY AND VERIFIED WITH TERESA CLAPP 03/03/18 @ [place] Performed at [facility], [address] [place], [place] [address]   Lactic acid, plasma     Status: Abnormal   Collection Time: 03/03/18  7:44 PM  Result Value Ref Range   Lactic Acid, Venous 2.0 (HH) 0.5 - 1.9 mmol/L    Comment: CRITICAL RESULT CALLED TO, READ BACK BY AND VERIFIED WITH Giani Betzold 03/03/18 @ 2023  [facility] Performed at [facility], [address], [place], [place] [address]    Dg Chest 1 View  Result Date: 03/03/2018 CLINICAL DATA:  Possible pneumonia. Difficulty breathing with fever. EXAM: CHEST  1 VIEW COMPARISON:  09/20/2017 FINDINGS: Lungs are adequately inflated with mild patchy bilateral airspace opacification with some nodular areas which may be due to infection. No effusion. Cardiomediastinal silhouette and remainder of the exam is unchanged. IMPRESSION: Subtle patchy multifocal airspace process with mild nodularity likely infection. Recommend follow-up chest radiograph 4 weeks. Electronically Signed   By: Marin Olp M.D.   On: 03/03/2018 18:59   Mr Cervical Spine W Or Wo Contrast  Result Date: 03/03/2018 CLINICAL DATA:  Initial evaluation for intraspinal abscess and granuloma. EXAM: MRI TOTAL SPINE WITHOUT AND WITH CONTRAST TECHNIQUE: Multisequence MR imaging of the spine from the cervical spine to the sacrum was performed prior to and following IV contrast administration for evaluation of spinal metastatic disease. CONTRAST:  5 cc of Gadavist. COMPARISON:  Prior MRI from 12/28/2017 and 11/14/2015. FINDINGS: MRI CERVICAL SPINE FINDINGS Alignment: Increased retrolisthesis of C5 on C6 now measuring up to 4 mm. New focal kyphotic angulation of the cervical spine at this level. Mild straightening seen throughout the remainder of the cervical spinal cord. Vertebrae: Progressive disc space narrowing, endplate irregularity, with vertebral body height loss at C5-6, consistent with progressive  osteomyelitis discitis. There is persistent abnormal marrow edema and enhancement within the C5 and C6 vertebral bodies, consistent with persistent infection. Circumferential epidural enhancement seen at this level without frank epidural abscess or collection. Epidural enhancement extends cephalad within the dorsal epidural space to the level of C2-3. There is angulation and mild perching of the C5-6 facets bilaterally due to focal kyphotic angulation. Mild edema and enhancement  noted within the C5-6 facets as well. Vertebral body heights otherwise maintained. No other evidence for osteomyelitis discitis. Underlying bone marrow signal intensity diffusely decreased on T1 weighted imaging, most commonly related to anemia, smoking, or obesity. No discrete or worrisome osseous lesions. Cord: There is impingement of the cervical spinal cord with focal cord kinking at the level of C5-6 due to adjacent retrolisthesis of the C5 vertebral body and focal kyphotic angulation (series 25, image 9). No definite cord signal changes, although evaluation somewhat limited by motion artifact. Remainder of the cervical spinal cord otherwise normal in appearance. No other appreciable epidural collection or abscess. Posterior Fossa, vertebral arteries, paraspinal tissues: Visualized brain and posterior fossa within normal limits. Craniocervical junction normal. There is abnormal edema and enhancement throughout the posterior paraspinous musculature, left greater than right. Superimposed multifocal heterogeneous and irregular soft tissue abscesses present within the left paraspinous soft tissues, extending from approximately C2-3 inferiorly through the upper thoracic spine. Largest collection seen at the level of C5 through C7 and measures 2.4 x 2.3 x 3.2 cm (transverse by AP by craniocaudad, series 37, image 16). Relative sparing of the prevertebral soft tissues. Normal intravascular flow voids seen within the vertebral arteries  bilaterally. Disc levels: C2-C3: Negative. C3-C4: Enhancement within the dorsal epidural space. Otherwise negative. C4-C5: Enhancement within the dorsal epidural space. Minimal left eccentric disc bulge. Mild spinal stenosis, largely related to the kyphotic angulation inferiorly. Foramina remain patent. C5-C6: Changes consistent with progressive osteomyelitis discitis with loss of C5-6 interspace as well as C5 and C6 vertebral body height loss. Associated 4 mm retrolisthesis of C5 on C6 with focal kyphotic angulation. Superimposed circumferential epidural enhancement at this level. There is impingement of the cervical spinal cord with fairly severe spinal stenosis. Thecal sac measures AP diameter at its most narrow point. Moderate bilateral C6 foraminal narrowing. C6-C7: Minimal epidural enhancement. No significant disc pathology. No stenosis. C7-T1:  Unremarkable. MRI THORACIC SPINE FINDINGS Alignment: Vertebral bodies normally aligned with preservation of the normal thoracic kyphosis. No listhesis or subluxation. Vertebrae: Vertebral body heights maintained without evidence for acute or chronic fracture. Bone marrow signal intensity diffusely decreased on T1 weighted imaging, most commonly related to anemia, smoking, or obesity. Small benign hemangioma noted within the T7 vertebral body. No other discrete or worrisome osseous lesions. No findings to suggest osteomyelitis for discitis. No definite evidence for septic arthritis. Cord: Signal intensity within the thoracic spinal cord within normal limits. No significant epidural abscess or collection identified. Paraspinal and other soft tissues: Abnormal edema and enhancement with superimposed multifocal soft tissue abscesses present within the left posterior paraspinous musculature within the upper thoracic spine, extending inferiorly from the cervical spine as above. Changes extend to approximately the T4-5 level. Largest collection within the thoracic spine  measures approximately 2.1 cm at the level of T2 (series 39, image 4). Multifocal nodular and patchy densities within the visualized lungs, most consistent with septic emboli. Suggestion of cavitation about several of these lesions. Disc levels: T12-L1: Disc bulge with superimposed shallow left paracentral disc protrusion mildly indents the ventral thecal sac. No significant stenosis. No other significant disc pathology within the thoracic spine. MRI LUMBAR SPINE FINDINGS Segmentation: Normal segmentation. Lowest well-formed disc labeled the L5-S1 level. 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 Alignment otherwise normal with preservation of the normal lumbar lordosis. Vertebrae: Vertebral body heights maintained without evidence for acute or chronic fracture. Bone marrow signal intensity diffusely decreased on T1 weighted imaging. No discrete  or worrisome osseous lesions. No evidence for osteomyelitis discitis or septic arthritis within the lumbar spine. Conus medullaris: Extends to the T12-L1 level and appears normal. Paraspinal and other soft tissues: paraspinous soft tissues demonstrate no acute finding. Few small cysts noted within the right kidney. Visualized visceral structures otherwise grossly unremarkable. Disc levels: No significant findings seen through the L4-5 level. L5-S1: Chronic bilateral L5 pars defects with associated 5 mm spondylolisthesis. Associated broad posterior pseudo disc bulge with disc desiccation and intervertebral disc space narrowing. No significant canal or lateral recess narrowing. Mild to moderate bilateral L5 foraminal stenosis due to slippage. IMPRESSION: 1. 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 Secondary impingement upon the cervical  spinal cord at this level with resultant severe spinal stenosis. Neuro surgical consultation recommended. 2. Extensive paraspinous phlegmon and infection with superimposed abscesses involving the central and left posterior paraspinous musculature extending from C2-3 through T4-5 as above. 3. Multifocal nodular densities in opacities within the visualized lungs, highly suspicious for septic emboli. 4. Shallow left paracentral disc protrusion at T12-L1 without significant stenosis. 5. 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 Critical Value/emergent results were called by telephone at the time of interpretation on 03/03/2018 at 11:48 pm to Dr. Archie Balboa , who verbally acknowledged these results. Electronically Signed   By: Jeannine Boga M.D.   On: 03/03/2018 23:52   Mr Thoracic Spine W Wo Contrast  Result Date: 03/03/2018 CLINICAL DATA:  Initial evaluation for intraspinal abscess and granuloma. EXAM: MRI TOTAL SPINE WITHOUT AND WITH CONTRAST TECHNIQUE: Multisequence MR imaging of the spine from the cervical spine to the sacrum was performed prior to and following IV contrast administration for evaluation of spinal metastatic disease. CONTRAST:  5 cc of Gadavist. COMPARISON:  Prior MRI from 12/28/2017 and 11/14/2015. FINDINGS: MRI CERVICAL SPINE FINDINGS Alignment: Increased retrolisthesis of C5 on C6 now measuring up to 4 mm. New focal kyphotic angulation of the cervical spine at this level. Mild straightening seen throughout the remainder of the cervical spinal cord. Vertebrae: Progressive disc space narrowing, endplate irregularity, with vertebral body height loss at C5-6, consistent with progressive osteomyelitis discitis. There is persistent abnormal marrow edema and enhancement within the C5 and C6 vertebral bodies, consistent with persistent infection. Circumferential epidural enhancement seen at this  level without frank epidural abscess or collection. Epidural enhancement extends cephalad within the dorsal epidural space to the level of C2-3. There is angulation and mild perching of the C5-6 facets bilaterally due to focal kyphotic angulation. Mild edema and enhancement noted within the C5-6 facets as well. Vertebral body heights otherwise maintained. No other evidence for osteomyelitis discitis. Underlying bone marrow signal intensity diffusely decreased on T1 weighted imaging, most commonly related to anemia, smoking, or obesity. No discrete or worrisome osseous lesions. Cord: There is impingement of the cervical spinal cord with focal cord kinking at the level of C5-6 due to adjacent retrolisthesis of the C5 vertebral body and focal kyphotic angulation (series 25, image 9). No definite cord signal changes, although evaluation somewhat limited by motion artifact. Remainder of the cervical spinal cord otherwise normal in appearance. No other appreciable epidural collection or abscess. Posterior Fossa, vertebral arteries, paraspinal tissues: Visualized brain and posterior fossa within normal limits. Craniocervical junction normal. There is abnormal  edema and enhancement throughout the posterior paraspinous musculature, left greater than right. Superimposed multifocal heterogeneous and irregular soft tissue abscesses present within the left paraspinous soft tissues, extending from approximately C2-3 inferiorly through the upper thoracic spine. Largest collection seen at the level of C5 through C7 and measures 2.4 x 2.3 x 3.2 cm (transverse by AP by craniocaudad, series 37, image 16). Relative sparing of the prevertebral soft tissues. Normal intravascular flow voids seen within the vertebral arteries bilaterally. Disc levels: C2-C3: Negative. C3-C4: Enhancement within the dorsal epidural space. Otherwise negative. C4-C5: Enhancement within the dorsal epidural space. Minimal left eccentric disc bulge. Mild spinal  stenosis, largely related to the kyphotic angulation inferiorly. Foramina remain patent. C5-C6: Changes consistent with progressive osteomyelitis discitis with loss of C5-6 interspace as well as C5 and C6 vertebral body height loss. Associated 4 mm retrolisthesis of C5 on C6 with focal kyphotic angulation. Superimposed circumferential epidural enhancement at this level. There is impingement of the cervical spinal cord with fairly severe spinal stenosis. Thecal sac measures AP diameter at its most narrow point. Moderate bilateral C6 foraminal narrowing. C6-C7: Minimal epidural enhancement. No significant disc pathology. No stenosis. C7-T1:  Unremarkable. MRI THORACIC SPINE FINDINGS Alignment: Vertebral bodies normally aligned with preservation of the normal thoracic kyphosis. No listhesis or subluxation. Vertebrae: Vertebral body heights maintained without evidence for acute or chronic fracture. Bone marrow signal intensity diffusely decreased on T1 weighted imaging, most commonly related to anemia, smoking, or obesity. Small benign hemangioma noted within the T7 vertebral body. No other discrete or worrisome osseous lesions. No findings to suggest osteomyelitis for discitis. No definite evidence for septic arthritis. Cord: Signal intensity within the thoracic spinal cord within normal limits. No significant epidural abscess or collection identified. Paraspinal and other soft tissues: Abnormal edema and enhancement with superimposed multifocal soft tissue abscesses present within the left posterior paraspinous musculature within the upper thoracic spine, extending inferiorly from the cervical spine as above. Changes extend to approximately the T4-5 level. Largest collection within the thoracic spine measures approximately 2.1 cm at the level of T2 (series 39, image 4). Multifocal nodular and patchy densities within the visualized lungs, most consistent with septic emboli. Suggestion of cavitation about several of  these lesions. Disc levels: T12-L1: Disc bulge with superimposed shallow left paracentral disc protrusion mildly indents the ventral thecal sac. No significant stenosis. No other significant disc pathology within the thoracic spine. MRI LUMBAR SPINE FINDINGS Segmentation: Normal segmentation. Lowest well-formed disc labeled the L5-S1 level. 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 Alignment otherwise normal with preservation of the normal lumbar lordosis. Vertebrae: Vertebral body heights maintained without evidence for acute or chronic fracture. Bone marrow signal intensity diffusely decreased on T1 weighted imaging. No discrete or worrisome osseous lesions. No evidence for osteomyelitis discitis or septic arthritis within the lumbar spine. Conus medullaris: Extends to the T12-L1 level and appears normal. Paraspinal and other soft tissues: paraspinous soft tissues demonstrate no acute finding. Few small cysts noted within the right kidney. Visualized visceral structures otherwise grossly unremarkable. Disc levels: No significant findings seen through the L4-5 level. L5-S1: Chronic bilateral L5 pars defects with associated 5 mm spondylolisthesis. Associated broad posterior pseudo disc bulge with disc desiccation and intervertebral disc space narrowing. No significant canal or lateral recess narrowing. Mild to moderate bilateral L5 foraminal stenosis due to slippage. IMPRESSION: 1. Progressive findings of osteomyelitis discitis at C5-6 with progressive disc space and vertebral body height loss, with progressive 4 mm retrolisthesis  of C5 on C6, resulting in focal kyphotic angulation of the cervical spine at this level. Secondary impingement upon the cervical spinal cord at this level with resultant severe spinal stenosis. Neuro surgical consultation recommended. 2. Extensive paraspinous phlegmon and infection with superimposed abscesses involving the central and left  posterior paraspinous musculature extending from C2-3 through T4-5 as above. 3. Multifocal nodular densities in opacities within the visualized lungs, highly suspicious for septic emboli. 4. Shallow left paracentral disc protrusion at T12-L1 without significant stenosis. 5. 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 Critical Value/emergent results were called by telephone at the time of interpretation on 03/03/2018 at 11:48 pm to Dr. Archie Balboa , who verbally acknowledged these results. Electronically Signed   By: Jeannine Boga M.D.   On: 03/03/2018 23:52   Mr Lumbar Spine W Wo Contrast  Result Date: 03/03/2018 CLINICAL DATA:  Initial evaluation for intraspinal abscess and granuloma. EXAM: MRI TOTAL SPINE WITHOUT AND WITH CONTRAST TECHNIQUE: Multisequence MR imaging of the spine from the cervical spine to the sacrum was performed prior to and following IV contrast administration for evaluation of spinal metastatic disease. CONTRAST:  5 cc of Gadavist. COMPARISON:  Prior MRI from 12/28/2017 and 11/14/2015. FINDINGS: MRI CERVICAL SPINE FINDINGS Alignment: Increased retrolisthesis of C5 on C6 now measuring up to 4 mm. New focal kyphotic angulation of the cervical spine at this level. Mild straightening seen throughout the remainder of the cervical spinal cord. Vertebrae: Progressive disc space narrowing, endplate irregularity, with vertebral body height loss at C5-6, consistent with progressive osteomyelitis discitis. There is persistent abnormal marrow edema and enhancement within the C5 and C6 vertebral bodies, consistent with persistent infection. Circumferential epidural enhancement seen at this level without frank epidural abscess or collection. Epidural enhancement extends cephalad within the dorsal epidural space to the level of C2-3. There is angulation and mild perching of the C5-6 facets bilaterally due  to focal kyphotic angulation. Mild edema and enhancement noted within the C5-6 facets as well. Vertebral body heights otherwise maintained. No other evidence for osteomyelitis discitis. Underlying bone marrow signal intensity diffusely decreased on T1 weighted imaging, most commonly related to anemia, smoking, or obesity. No discrete or worrisome osseous lesions. Cord: There is impingement of the cervical spinal cord with focal cord kinking at the level of C5-6 due to adjacent retrolisthesis of the C5 vertebral body and focal kyphotic angulation (series 25, image 9). No definite cord signal changes, although evaluation somewhat limited by motion artifact. Remainder of the cervical spinal cord otherwise normal in appearance. No other appreciable epidural collection or abscess. Posterior Fossa, vertebral arteries, paraspinal tissues: Visualized brain and posterior fossa within normal limits. Craniocervical junction normal. There is abnormal edema and enhancement throughout the posterior paraspinous musculature, left greater than right. Superimposed multifocal heterogeneous and irregular soft tissue abscesses present within the left paraspinous soft tissues, extending from approximately C2-3 inferiorly through the upper thoracic spine. Largest collection seen at the level of C5 through C7 and measures 2.4 x 2.3 x 3.2 cm (transverse by AP by craniocaudad, series 37, image 16). Relative sparing of the prevertebral soft tissues. Normal intravascular flow voids seen within the vertebral arteries bilaterally. Disc levels: C2-C3: Negative. C3-C4: Enhancement within the dorsal epidural space. Otherwise negative. C4-C5: Enhancement within the dorsal epidural space. Minimal left eccentric disc bulge. Mild spinal stenosis, largely related to the kyphotic angulation inferiorly. Foramina remain patent. C5-C6: Changes consistent with progressive osteomyelitis discitis with  loss of C5-6 interspace as well as C5 and C6 vertebral body  height loss. Associated 4 mm retrolisthesis of C5 on C6 with focal kyphotic angulation. Superimposed circumferential epidural enhancement at this level. There is impingement of the cervical spinal cord with fairly severe spinal stenosis. Thecal sac measures AP diameter at its most narrow point. Moderate bilateral C6 foraminal narrowing. C6-C7: Minimal epidural enhancement. No significant disc pathology. No stenosis. C7-T1:  Unremarkable. MRI THORACIC SPINE FINDINGS Alignment: Vertebral bodies normally aligned with preservation of the normal thoracic kyphosis. No listhesis or subluxation. Vertebrae: Vertebral body heights maintained without evidence for acute or chronic fracture. Bone marrow signal intensity diffusely decreased on T1 weighted imaging, most commonly related to anemia, smoking, or obesity. Small benign hemangioma noted within the T7 vertebral body. No other discrete or worrisome osseous lesions. No findings to suggest osteomyelitis for discitis. No definite evidence for septic arthritis. Cord: Signal intensity within the thoracic spinal cord within normal limits. No significant epidural abscess or collection identified. Paraspinal and other soft tissues: Abnormal edema and enhancement with superimposed multifocal soft tissue abscesses present within the left posterior paraspinous musculature within the upper thoracic spine, extending inferiorly from the cervical spine as above. Changes extend to approximately the T4-5 level. Largest collection within the thoracic spine measures approximately 2.1 cm at the level of T2 (series 39, image 4). Multifocal nodular and patchy densities within the visualized lungs, most consistent with septic emboli. Suggestion of cavitation about several of these lesions. Disc levels: T12-L1: Disc bulge with superimposed shallow left paracentral disc protrusion mildly indents the ventral thecal sac. No significant stenosis. No other significant disc pathology within the  thoracic spine. MRI LUMBAR SPINE FINDINGS Segmentation: Normal segmentation. Lowest well-formed disc labeled the L5-S1 level. 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 Alignment otherwise normal with preservation of the normal lumbar lordosis. Vertebrae: Vertebral body heights maintained without evidence for acute or chronic fracture. Bone marrow signal intensity diffusely decreased on T1 weighted imaging. No discrete or worrisome osseous lesions. No evidence for osteomyelitis discitis or septic arthritis within the lumbar spine. Conus medullaris: Extends to the T12-L1 level and appears normal. Paraspinal and other soft tissues: paraspinous soft tissues demonstrate no acute finding. Few small cysts noted within the right kidney. Visualized visceral structures otherwise grossly unremarkable. Disc levels: No significant findings seen through the L4-5 level. L5-S1: Chronic bilateral L5 pars defects with associated 5 mm spondylolisthesis. Associated broad posterior pseudo disc bulge with disc desiccation and intervertebral disc space narrowing. No significant canal or lateral recess narrowing. Mild to moderate bilateral L5 foraminal stenosis due to slippage. IMPRESSION: 1. 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 Secondary impingement upon the cervical spinal cord at this level with resultant severe spinal stenosis. Neuro surgical consultation recommended. 2. Extensive paraspinous phlegmon and infection with superimposed abscesses involving the central and left posterior paraspinous musculature extending from C2-3 through T4-5 as above. 3. Multifocal nodular densities in opacities within the visualized lungs, highly suspicious for septic emboli. 4. Shallow left paracentral disc  protrusion at T12-L1 without significant stenosis. 5. 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 Critical Value/emergent results were called by telephone at the time of interpretation on 03/03/2018 at 11:48 pm to Dr. Archie Balboa ,  who verbally acknowledged these results. Electronically Signed   By: Jeannine Boga M.D.   On: 03/03/2018 23:52    Pending Labs Unresulted Labs (From admission, onward)    Start     Ordered   03/07/18 1130  Vancomycin, trough  Once-Timed,   STAT     03/03/18 2150   03/03/18 1720  Culture, blood (routine x 2)  BLOOD CULTURE X 2,   STAT     03/03/18 1719   03/03/18 1709  Urinalysis, Complete w Microscopic  STAT,   STAT     03/03/18 1708   Signed and Held  CBC  (enoxaparin (LOVENOX)    CrCl >/= 30 ml/min)  Once,   R    Comments:  Baseline for enoxaparin therapy IF NOT ALREADY DRAWN.  Notify MD if PLT < 100 K.    Signed and Held   Signed and Held  Creatinine, serum  (enoxaparin (LOVENOX)    CrCl >/= 30 ml/min)  Once,   R    Comments:  Baseline for enoxaparin therapy IF NOT ALREADY DRAWN.    Signed and Held   Signed and Held  Creatinine, serum  (enoxaparin (LOVENOX)    CrCl >/= 30 ml/min)  Weekly,   R    Comments:  while on enoxaparin therapy    Signed and Held   Signed and Held  Basic metabolic panel  Tomorrow morning,   R     Signed and Held   Signed and Held  CBC  Tomorrow morning,   R     Signed and Held          Vitals/Pain Today's Vitals   03/04/18 0100 03/04/18 0115 03/04/18 0130 03/04/18 0200  BP: (!) 108/93 117/63 112/72 112/63  Pulse:   (!) 123 (!) 122  Resp:      Temp:      TempSrc:      SpO2:   98% 97%  Weight:      Height:      PainSc:        Isolation Precautions No active isolations  Medications Medications  vancomycin (VANCOCIN) IVPB 750 mg/150 ml premix (has no administration in time range)  ceFEPIme (MAXIPIME) 2 g in sodium  chloride 0.9 % 100 mL IVPB (has no administration in time range)  sodium chloride 0.9 % bolus 1,000 mL (0 mLs Intravenous Stopped 03/03/18 2051)  morphine 4 MG/ML injection 4 mg (4 mg Intravenous Given 03/03/18 1836)  ondansetron (ZOFRAN) injection 4 mg (4 mg Intravenous Given 03/03/18 1836)  ceFEPIme (MAXIPIME) 2 g in sodium chloride 0.9 % 100 mL IVPB (0 g Intravenous Stopped 03/03/18 2025)  vancomycin (VANCOCIN) IVPB 1000 mg/200 mL premix (0 mg Intravenous Stopped 03/03/18 2138)  fentaNYL (SUBLIMAZE) injection 50 mcg (50 mcg Intravenous Given 03/03/18 2030)  LORazepam (ATIVAN) injection 1 mg (1 mg Intravenous Given 03/03/18 2136)  gadobutrol (GADAVIST) 1 MMOL/ML injection 5 mL (5 mLs Intravenous Contrast Given 03/03/18 2246)  HYDROmorphone (DILAUDID) 1 MG/ML injection (1 mg  Given 03/03/18 2300)  haloperidol lactate (HALDOL) injection 5 mg (5 mg Intravenous Given 03/04/18 0046)  LORazepam (ATIVAN) injection 2 mg (2 mg Intravenous Given 03/04/18 0047)    Mobility walks

## 2018-03-06 LAB — CULTURE, BLOOD (ROUTINE X 2): Special Requests: ADEQUATE

## 2018-06-15 ENCOUNTER — Inpatient Hospital Stay
Admission: EM | Admit: 2018-06-15 | Discharge: 2018-06-18 | DRG: 871 | Discharge status: Against Medical Advice | Payer: Self-pay | Attending: Internal Medicine | Admitting: Internal Medicine

## 2018-06-15 ENCOUNTER — Other Ambulatory Visit: Payer: Self-pay

## 2018-06-15 ENCOUNTER — Emergency Department: Payer: Self-pay

## 2018-06-15 ENCOUNTER — Encounter: Payer: Self-pay | Admitting: Emergency Medicine

## 2018-06-15 DIAGNOSIS — Z8774 Personal history of (corrected) congenital malformations of heart and circulatory system: Secondary | ICD-10-CM

## 2018-06-15 DIAGNOSIS — A419 Sepsis, unspecified organism: Principal | ICD-10-CM | POA: Diagnosis present

## 2018-06-15 DIAGNOSIS — F419 Anxiety disorder, unspecified: Secondary | ICD-10-CM | POA: Diagnosis present

## 2018-06-15 DIAGNOSIS — R9389 Abnormal findings on diagnostic imaging of other specified body structures: Secondary | ICD-10-CM

## 2018-06-15 DIAGNOSIS — M4802 Spinal stenosis, cervical region: Secondary | ICD-10-CM | POA: Diagnosis present

## 2018-06-15 DIAGNOSIS — M4646 Discitis, unspecified, lumbar region: Secondary | ICD-10-CM

## 2018-06-15 DIAGNOSIS — R509 Fever, unspecified: Secondary | ICD-10-CM

## 2018-06-15 DIAGNOSIS — G894 Chronic pain syndrome: Secondary | ICD-10-CM | POA: Diagnosis present

## 2018-06-15 DIAGNOSIS — Z952 Presence of prosthetic heart valve: Secondary | ICD-10-CM

## 2018-06-15 DIAGNOSIS — M4649 Discitis, unspecified, multiple sites in spine: Secondary | ICD-10-CM | POA: Diagnosis present

## 2018-06-15 DIAGNOSIS — F1190 Opioid use, unspecified, uncomplicated: Secondary | ICD-10-CM | POA: Diagnosis present

## 2018-06-15 DIAGNOSIS — Z86711 Personal history of pulmonary embolism: Secondary | ICD-10-CM

## 2018-06-15 DIAGNOSIS — Z79899 Other long term (current) drug therapy: Secondary | ICD-10-CM

## 2018-06-15 DIAGNOSIS — G061 Intraspinal abscess and granuloma: Secondary | ICD-10-CM | POA: Diagnosis present

## 2018-06-15 DIAGNOSIS — Z5329 Procedure and treatment not carried out because of patient's decision for other reasons: Secondary | ICD-10-CM | POA: Diagnosis not present

## 2018-06-15 DIAGNOSIS — Z800 Family history of malignant neoplasm of digestive organs: Secondary | ICD-10-CM

## 2018-06-15 DIAGNOSIS — F13239 Sedative, hypnotic or anxiolytic dependence with withdrawal, unspecified: Secondary | ICD-10-CM | POA: Diagnosis present

## 2018-06-15 DIAGNOSIS — Z6820 Body mass index (BMI) 20.0-20.9, adult: Secondary | ICD-10-CM

## 2018-06-15 DIAGNOSIS — M4627 Osteomyelitis of vertebra, lumbosacral region: Secondary | ICD-10-CM | POA: Diagnosis present

## 2018-06-15 DIAGNOSIS — E46 Unspecified protein-calorie malnutrition: Secondary | ICD-10-CM | POA: Diagnosis present

## 2018-06-15 DIAGNOSIS — M545 Low back pain, unspecified: Secondary | ICD-10-CM

## 2018-06-15 DIAGNOSIS — M4622 Osteomyelitis of vertebra, cervical region: Secondary | ICD-10-CM | POA: Diagnosis present

## 2018-06-15 DIAGNOSIS — J45909 Unspecified asthma, uncomplicated: Secondary | ICD-10-CM | POA: Diagnosis present

## 2018-06-15 DIAGNOSIS — F1910 Other psychoactive substance abuse, uncomplicated: Secondary | ICD-10-CM | POA: Diagnosis present

## 2018-06-15 DIAGNOSIS — Z833 Family history of diabetes mellitus: Secondary | ICD-10-CM

## 2018-06-15 DIAGNOSIS — Z8614 Personal history of Methicillin resistant Staphylococcus aureus infection: Secondary | ICD-10-CM

## 2018-06-15 DIAGNOSIS — Z8619 Personal history of other infectious and parasitic diseases: Secondary | ICD-10-CM

## 2018-06-15 DIAGNOSIS — F17210 Nicotine dependence, cigarettes, uncomplicated: Secondary | ICD-10-CM | POA: Diagnosis present

## 2018-06-15 DIAGNOSIS — M48061 Spinal stenosis, lumbar region without neurogenic claudication: Secondary | ICD-10-CM | POA: Diagnosis present

## 2018-06-15 DIAGNOSIS — Z981 Arthrodesis status: Secondary | ICD-10-CM

## 2018-06-15 LAB — COMPREHENSIVE METABOLIC PANEL
ALT: 8 U/L (ref 0–44)
AST: 11 U/L — ABNORMAL LOW (ref 15–41)
Albumin: 3.9 g/dL (ref 3.5–5.0)
Alkaline Phosphatase: 62 U/L (ref 38–126)
Anion gap: 7 (ref 5–15)
BUN: 16 mg/dL (ref 6–20)
CALCIUM: 9.1 mg/dL (ref 8.9–10.3)
CO2: 24 mmol/L (ref 22–32)
Chloride: 102 mmol/L (ref 98–111)
Creatinine, Ser: 0.89 mg/dL (ref 0.44–1.00)
GFR calc Af Amer: >60 mL/min (ref >60)
GFR calc non Af Amer: >60 mL/min (ref >60)
Glucose, Bld: 112 mg/dL — ABNORMAL HIGH (ref 70–99)
Potassium: 3.9 mmol/L (ref 3.5–5.1)
Sodium: 133 mmol/L — ABNORMAL LOW (ref 135–145)
Total Bilirubin: 1 mg/dL (ref 0.3–1.2)
Total Protein: 8.2 g/dL — ABNORMAL HIGH (ref 6.5–8.1)

## 2018-06-15 LAB — CBC WITH DIFFERENTIAL/PLATELET
Abs Immature Granulocytes: 0.12 10*3/uL — ABNORMAL HIGH (ref 0.00–0.07)
Basophils Absolute: 0.1 10*3/uL (ref 0.0–0.1)
Basophils Relative: 0 %
Eosinophils Absolute: 0.1 10*3/uL (ref 0.0–0.5)
Eosinophils Relative: 0 %
HCT: 33.2 % — ABNORMAL LOW (ref 36.0–46.0)
Hemoglobin: 10.3 g/dL — ABNORMAL LOW (ref 12.0–15.0)
Immature Granulocytes: 1 %
Lymphocytes Relative: 12 %
Lymphs Abs: 2.3 10*3/uL (ref 0.7–4.0)
MCH: 25.7 pg — ABNORMAL LOW (ref 26.0–34.0)
MCHC: 31 g/dL (ref 30.0–36.0)
MCV: 82.8 fL (ref 80.0–100.0)
MONOS PCT: 6 %
Monocytes Absolute: 1.2 10*3/uL — ABNORMAL HIGH (ref 0.1–1.0)
Neutro Abs: 16 10*3/uL — ABNORMAL HIGH (ref 1.7–7.7)
Neutrophils Relative %: 81 %
Platelets: 351 10*3/uL (ref 150–400)
RBC: 4.01 MIL/uL (ref 3.87–5.11)
RDW: 17 % — ABNORMAL HIGH (ref 11.5–15.5)
WBC: 19.8 10*3/uL — ABNORMAL HIGH (ref 4.0–10.5)
nRBC: 0 % (ref 0.0–0.2)

## 2018-06-15 LAB — LACTIC ACID, PLASMA: Lactic Acid, Venous: 1.1 mmol/L (ref 0.5–1.9)

## 2018-06-15 LAB — PROTIME-INR
INR: 1.05
Prothrombin Time: 13.6 seconds (ref 11.4–15.2)

## 2018-06-15 MED ORDER — SODIUM CHLORIDE 0.9 % IV SOLN
2.0000 g | Freq: Once | INTRAVENOUS | Status: AC
  Administered 2018-06-15: 2 g via INTRAVENOUS
  Filled 2018-06-15: qty 2

## 2018-06-15 MED ORDER — ONDANSETRON HCL 4 MG/2ML IJ SOLN
4.0000 mg | Freq: Once | INTRAMUSCULAR | Status: AC
  Administered 2018-06-15: 4 mg via INTRAVENOUS
  Filled 2018-06-15: qty 2

## 2018-06-15 MED ORDER — VANCOMYCIN HCL 10 G IV SOLR
1250.0000 mg | Freq: Once | INTRAVENOUS | Status: AC
  Administered 2018-06-16: 1250 mg via INTRAVENOUS
  Filled 2018-06-15: qty 1250

## 2018-06-15 MED ORDER — METRONIDAZOLE IN NACL 5-0.79 MG/ML-% IV SOLN
500.0000 mg | Freq: Three times a day (TID) | INTRAVENOUS | Status: DC
  Administered 2018-06-15: 500 mg via INTRAVENOUS
  Filled 2018-06-15 (×2): qty 100

## 2018-06-15 MED ORDER — SODIUM CHLORIDE 0.9 % IV BOLUS
1000.0000 mL | Freq: Once | INTRAVENOUS | Status: AC
  Administered 2018-06-15: 1000 mL via INTRAVENOUS

## 2018-06-15 MED ORDER — VANCOMYCIN HCL IN DEXTROSE 1-5 GM/200ML-% IV SOLN: 1000.0000 mg | Freq: Once | INTRAVENOUS | Status: DC

## 2018-06-15 MED ORDER — MORPHINE SULFATE (PF) 4 MG/ML IV SOLN
4.0000 mg | Freq: Once | INTRAVENOUS | Status: AC
  Administered 2018-06-15: 4 mg via INTRAVENOUS
  Filled 2018-06-15: qty 1

## 2018-06-15 NOTE — ED Provider Notes (Signed)
[facility] Emergency Department Provider Note  ____________________________________________   First MD Initiated Contact with Patient 06/15/18 2138     (approximate)  I have reviewed the triage vital signs and the nursing notes.   HISTORY  Chief Complaint Back Pain   HPI Andrea King is a 43 y.o. female with a history of hepatitis C as well as IV drug use, discitis with recent spinal surgery to evacuate epidural abscesses and discitis who is presenting to the emergency department with 10 of 10 low back pain which he says is been increasing over the past 3 days.  Also associated with fever.  Says that she did temperature 101.5 at home and took ibuprofen, 400 mg prior to arrival.  Patient denying any weakness or numbness.  Does not report any loss of bowel or bladder continence.  Says that she was discharged from [facility] on February 1 and is no longer on antibiotics.  When asked if she is currently using IV drugs she states that she "has not used any IV drugs recently."  When I asked her if she be more specific she says "maybe not in a few weeks."   Past Medical History:  Diagnosis Date  . Anemia   . Asthma   . DJD (degenerative joint disease)   . Hepatitis C   . Substance abuse [facility])     Patient Active Problem List   Diagnosis Date Noted  . Malnutrition of moderate degree 12/29/2017  . 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 Palliative care by specialist   . Goals of care, counseling/discussion   . Periorbital cellulitis 09/20/2017  . Hypoglycemia 09/20/2017  . Bacteremia due to Staphylococcus   . Sepsis (HCC) 11/13/2015  . Community acquired pneumonia 11/13/2015  . Pneumonia 11/13/2015  . Substance induced mood disorder (HCC) 11/13/2015  . Opiate withdrawal (HCC) 11/13/2015  . Opiate abuse, continuous (HCC) 11/13/2015  . Incarcerated femoral hernia left 04/28/2013    Past Surgical History:  Procedure Laterality Date  . INGUINAL HERNIA REPAIR Left  04/28/2013   Procedure: HERNIA REPAIR INGUINAL ADULT with mesh;  Surgeon: Cherylynn Ridges, MD;  Location: [facility] OR;  Service: General;  Laterality: Left;  . TEE WITHOUT CARDIOVERSION N/A 11/18/2015   Procedure: TRANSESOPHAGEAL ECHOCARDIOGRAM (TEE);  Surgeon: Iran Ouch, MD;  Location: [facility] ORS;  Service: Cardiovascular;  Laterality: N/A;  . TEE WITHOUT CARDIOVERSION N/A 12/31/2017   Procedure: TRANSESOPHAGEAL ECHOCARDIOGRAM (TEE);  Surgeon: Dalia Heading, MD;  Location: [facility] ORS;  Service: Cardiovascular;  Laterality: N/A;  . TEE WITHOUT CARDIOVERSION N/A 01/04/2018   Procedure: TRANSESOPHAGEAL ECHOCARDIOGRAM (TEE);  Surgeon: Dalia Heading, MD;  Location: [facility] ORS;  Service: Cardiovascular;  Laterality: N/A;  . TUMOR EXCISION     left scalpula    Prior to Admission medications   Medication Sig Start Date End Date Taking? Authorizing Provider  acetaminophen (TYLENOL) 500 MG tablet Take 500-1,000 mg by mouth every 6 (six) hours as needed for mild pain or moderate pain.    [provider]  ceFEPIme 2 g in sodium chloride 0.9 % 100 mL Inject 2 g into the vein every 12 (twelve) hours. 03/04/18   [name], MD  Vancomycin (VANCOCIN) 750-5 MG/150ML-% SOLN Inject 150 mLs (750 mg total) into the vein daily. 03/05/18   [name], MD    Allergies Patient has no known allergies.  Family History  Problem Relation Age of Onset  . Cancer Mother   . Cancer - Colon Maternal Grandfather   .  Diabetes Maternal Grandfather     Social History Social History   Tobacco Use  . Smoking status: Current Every Day Smoker    Types: Cigarettes  . Smokeless tobacco: Never Used  Substance Use Topics  . Alcohol use: No    Alcohol/week: 0.0 standard drinks  . Drug use: Yes    Comment: cocaine,Heroine    Review of Systems  Constitutional: As above Eyes: No visual changes. ENT: No sore throat. Cardiovascular: Denies chest pain. Respiratory: Denies shortness of breath. Gastrointestinal: No  abdominal pain.  No nausea, no vomiting.  No diarrhea.  No constipation. Genitourinary: Negative for dysuria. Musculoskeletal: As above Skin: Negative for rash. Neurological: Negative for headaches, focal weakness or numbness.   ____________________________________________   PHYSICAL EXAM:  VITAL SIGNS: ED Triage Vitals  Enc Vitals Group     BP 06/15/18 2230 115/66     Pulse Rate 06/15/18 2133 (!) 123     Resp 06/15/18 2230 15     Temp 06/15/18 2133 99.7 F (37.6 C)     Temp Source 06/15/18 2133 Oral     SpO2 06/15/18 2133 100 %     Weight 06/15/18 2134 112 lb (50.8 kg)     Height 06/15/18 2134 5\' 2"  (1.575 m)     Head Circumference --      Peak Flow --      Pain Score 06/15/18 2134 10     Pain Loc --      Pain Edu? --      Excl. in GC? --     Constitutional: Alert and oriented.  Appears ill and uncomfortable. Eyes: Conjunctivae are normal.  Head: Atraumatic. Nose: No congestion/rhinnorhea. Mouth/Throat: Mucous membranes are moist.  Neck: No stridor.   Cardiovascular: Tachycardic, regular rhythm. Grossly normal heart sounds.   Respiratory: Normal respiratory effort.  No retractions. Lungs CTAB. Gastrointestinal: Soft and nontender. No distention. Musculoskeletal: Mild to moderate tenderness across the lower lumbar region without any focal tenderness to palpation.  No tenderness to palpation of the thoracic or lumbar spines.  Well-healed surgical incisions in the thoracic and cervical region.  5-5 strength bilateral lower extremities.  No saddle anesthesia. Neurologic:  Normal speech and language. No gross focal neurologic deficits are appreciated. Skin:  Skin is warm, dry and intact. No rash noted. Psychiatric: Mood and affect are normal. Speech and behavior are normal.  ____________________________________________   LABS (all labs ordered are listed, but only abnormal results are displayed)  Labs Reviewed  COMPREHENSIVE METABOLIC PANEL - Abnormal; Notable for the  following components:      Result Value   Sodium 133 (*)    Glucose, Bld 112 (*)    Total Protein 8.2 (*)    AST 11 (*)    All other components within normal limits  CBC WITH DIFFERENTIAL/PLATELET - Abnormal; Notable for the following components:   WBC 19.8 (*)    Hemoglobin 10.3 (*)    HCT 33.2 (*)    MCH 25.7 (*)    RDW 17.0 (*)    Neutro Abs 16.0 (*)    Monocytes Absolute 1.2 (*)    Abs Immature Granulocytes 0.12 (*)    All other components within normal limits  CULTURE, BLOOD (ROUTINE X 2)  CULTURE, BLOOD (ROUTINE X 2)  LACTIC ACID, PLASMA  PROTIME-INR  URINALYSIS, COMPLETE (UACMP) WITH MICROSCOPIC  URINE DRUG SCREEN, QUALITATIVE ([facility] ONLY)  POC URINE PREG, ED   ____________________________________________  EKG  ED ECG REPORT I, Arelia Longestavid M Schaevitz, the attending  physician, personally viewed and interpreted this ECG.   Date: 06/15/2018  EKG Time: 2144  Rate: 119  Rhythm: sinus tachycardia  Axis: Normal  Intervals:none  ST&T Change: No ST segment elevation or depression.  No abnormal T wave inversion.  ____________________________________________  RADIOLOGY  Pending MRI of the cervical, thoracic and lumbar spines. ____________________________________________   PROCEDURES  Procedure(s) performed:   .Critical Care Performed by: Myrna Blazer, MD Authorized by: Myrna Blazer, MD   Critical care provider statement:    Critical care time (minutes):  35   Critical care time was exclusive of:  Separately billable procedures and treating other patients   Critical care was necessary to treat or prevent imminent or life-threatening deterioration of the following conditions:  Sepsis   Critical care was time spent personally by me on the following activities:  Development of treatment plan with patient or surrogate, discussions with consultants, evaluation of patient's response to treatment, examination of patient, obtaining history from patient  or surrogate, ordering and performing treatments and interventions, ordering and review of laboratory studies, ordering and review of radiographic studies, pulse oximetry, re-evaluation of patient's condition and review of old charts    Critical Care performed:   ____________________________________________   INITIAL IMPRESSION / ASSESSMENT AND PLAN / ED COURSE  Pertinent labs & imaging results that were available during my care of the patient were reviewed by me and considered in my medical decision making (see chart for details).  DDX: UTI, sepsis, bacteremia, discitis, epidural abscess As part of my medical decision making, I reviewed the following data within the electronic MEDICAL RECORD NUMBER Notes from prior ED visits as well as recent notes from [facility].  ----------------------------------------- 11:32 PM on 06/15/2018 -----------------------------------------  I discussed the case with Dr. Marcell Barlow of neurosurgery who states that since the patient appears to be actively using drugs that a surgery will only be performed if the patient has a neurologic deficit which she does not at this time.  However, she does appear to have sepsis, likely from either discitis or recurrence of epidural abscess.  Dr. Marcell Barlow and I agree that admission to [place] should be appropriate given that the patient does not have neurologic deficits and will not need emergent surgery at this time.  Patient aware of need for admission to the hospital.  Signed out to Dr. Blair Hailey of the medicine service. ____________________________________________   FINAL CLINICAL IMPRESSION(S) / ED DIAGNOSES  Fever.  Back pain.  NEW MEDICATIONS STARTED DURING THIS VISIT:  New Prescriptions   No medications on file     Note:  This document was prepared using Dragon voice recognition software and may include unintentional dictation errors.     Myrna Blazer, MD 06/15/18 [phone]

## 2018-06-15 NOTE — ED Triage Notes (Signed)
Pt to ED via EMS from home c/o low back pain that started 2 days ago, lower back.  Pt seen recently for MRSA in her back and shipped to [facility].

## 2018-06-15 NOTE — ED Notes (Signed)
Pt informed of need for urine sample.

## 2018-06-16 ENCOUNTER — Inpatient Hospital Stay: Payer: Self-pay

## 2018-06-16 ENCOUNTER — Emergency Department: Payer: Self-pay

## 2018-06-16 ENCOUNTER — Inpatient Hospital Stay
Admit: 2018-06-16 | Discharge: 2018-06-16 | Disposition: A | Discharge status: Home / Self Care | Payer: Self-pay | Attending: Family Medicine | Admitting: Family Medicine

## 2018-06-16 ENCOUNTER — Other Ambulatory Visit: Payer: Self-pay

## 2018-06-16 DIAGNOSIS — R011 Cardiac murmur, unspecified: Secondary | ICD-10-CM

## 2018-06-16 DIAGNOSIS — G061 Intraspinal abscess and granuloma: Secondary | ICD-10-CM

## 2018-06-16 DIAGNOSIS — Z8614 Personal history of Methicillin resistant Staphylococcus aureus infection: Secondary | ICD-10-CM

## 2018-06-16 DIAGNOSIS — Z8679 Personal history of other diseases of the circulatory system: Secondary | ICD-10-CM

## 2018-06-16 DIAGNOSIS — M4627 Osteomyelitis of vertebra, lumbosacral region: Secondary | ICD-10-CM

## 2018-06-16 DIAGNOSIS — Z981 Arthrodesis status: Secondary | ICD-10-CM

## 2018-06-16 DIAGNOSIS — Z8774 Personal history of (corrected) congenital malformations of heart and circulatory system: Secondary | ICD-10-CM

## 2018-06-16 DIAGNOSIS — F1190 Opioid use, unspecified, uncomplicated: Secondary | ICD-10-CM

## 2018-06-16 DIAGNOSIS — Z952 Presence of prosthetic heart valve: Secondary | ICD-10-CM

## 2018-06-16 DIAGNOSIS — F17200 Nicotine dependence, unspecified, uncomplicated: Secondary | ICD-10-CM

## 2018-06-16 DIAGNOSIS — Z8739 Personal history of other diseases of the musculoskeletal system and connective tissue: Secondary | ICD-10-CM

## 2018-06-16 DIAGNOSIS — F419 Anxiety disorder, unspecified: Secondary | ICD-10-CM

## 2018-06-16 DIAGNOSIS — M4647 Discitis, unspecified, lumbosacral region: Secondary | ICD-10-CM

## 2018-06-16 LAB — URINALYSIS, COMPLETE (UACMP) WITH MICROSCOPIC
Bilirubin Urine: NEGATIVE
Glucose, UA: NEGATIVE mg/dL
Ketones, ur: NEGATIVE mg/dL
LEUKOCYTE UA: NEGATIVE
NITRITE: NEGATIVE
Protein, ur: 30 mg/dL — AB
Specific Gravity, Urine: 1.009 (ref 1.005–1.030)
pH: 5 (ref 5.0–8.0)

## 2018-06-16 LAB — URINE DRUG SCREEN, QUALITATIVE (ARMC ONLY)
AMPHETAMINES, UR SCREEN: NOT DETECTED
Barbiturates, Ur Screen: NOT DETECTED
Benzodiazepine, Ur Scrn: POSITIVE — AB
Cannabinoid 50 Ng, Ur ~~LOC~~: POSITIVE — AB
Cocaine Metabolite,Ur ~~LOC~~: NOT DETECTED
MDMA (Ecstasy)Ur Screen: NOT DETECTED
Methadone Scn, Ur: NOT DETECTED
Opiate, Ur Screen: POSITIVE — AB
PHENCYCLIDINE (PCP) UR S: NOT DETECTED
Tricyclic, Ur Screen: NOT DETECTED

## 2018-06-16 LAB — MRSA PCR SCREENING: MRSA by PCR: NEGATIVE

## 2018-06-16 LAB — POC URINE PREG, ED: PREG TEST UR: NEGATIVE

## 2018-06-16 MED ORDER — VANCOMYCIN HCL 10 G IV SOLR
1250.0000 mg | INTRAVENOUS | Status: DC
  Administered 2018-06-16 – 2018-06-17 (×2): 1250 mg via INTRAVENOUS
  Filled 2018-06-16 (×3): qty 1250

## 2018-06-16 MED ORDER — DEXTROSE-NACL 5-0.45 % IV SOLN
INTRAVENOUS | Status: DC
  Administered 2018-06-16 – 2018-06-17 (×3): via INTRAVENOUS

## 2018-06-16 MED ORDER — DOCUSATE SODIUM 100 MG PO CAPS
100.0000 mg | ORAL_CAPSULE | Freq: Two times a day (BID) | ORAL | Status: DC
  Administered 2018-06-16 – 2018-06-18 (×4): 100 mg via ORAL
  Filled 2018-06-16 (×5): qty 1

## 2018-06-16 MED ORDER — FLEET ENEMA 7-19 GM/118ML RE ENEM: 1.0000 | ENEMA | Freq: Once | RECTAL | Status: DC | PRN

## 2018-06-16 MED ORDER — HYDROCODONE-ACETAMINOPHEN 5-325 MG PO TABS
1.0000 | ORAL_TABLET | ORAL | Status: DC | PRN
  Administered 2018-06-16 – 2018-06-18 (×8): 2 via ORAL
  Filled 2018-06-16 (×9): qty 2

## 2018-06-16 MED ORDER — ACETAMINOPHEN 325 MG PO TABS: 650.0000 mg | ORAL_TABLET | Freq: Four times a day (QID) | ORAL | Status: DC | PRN

## 2018-06-16 MED ORDER — VITAMIN C 500 MG PO TABS
250.0000 mg | ORAL_TABLET | Freq: Two times a day (BID) | ORAL | Status: DC
  Administered 2018-06-16 – 2018-06-18 (×4): 250 mg via ORAL
  Filled 2018-06-16 (×4): qty 1

## 2018-06-16 MED ORDER — SODIUM CHLORIDE 0.9 % IV SOLN
1.0000 g | Freq: Three times a day (TID) | INTRAVENOUS | Status: DC
  Filled 2018-06-16: qty 1

## 2018-06-16 MED ORDER — TRAZODONE HCL 50 MG PO TABS
50.0000 mg | ORAL_TABLET | Freq: Every evening | ORAL | Status: DC | PRN
  Administered 2018-06-17: 50 mg via ORAL
  Filled 2018-06-16: qty 1

## 2018-06-16 MED ORDER — PIPERACILLIN-TAZOBACTAM 3.375 G IVPB
3.3750 g | Freq: Three times a day (TID) | INTRAVENOUS | Status: DC
  Administered 2018-06-16 – 2018-06-17 (×4): 3.375 g via INTRAVENOUS
  Filled 2018-06-16 (×4): qty 50

## 2018-06-16 MED ORDER — ACETAMINOPHEN 325 MG PO TABS
650.0000 mg | ORAL_TABLET | Freq: Four times a day (QID) | ORAL | Status: DC | PRN
  Administered 2018-06-17: 650 mg via ORAL
  Filled 2018-06-16: qty 2

## 2018-06-16 MED ORDER — METHADONE HCL 10 MG PO TABS
30.0000 mg | ORAL_TABLET | Freq: Every day | ORAL | Status: DC
  Administered 2018-06-16 – 2018-06-18 (×3): 30 mg via ORAL
  Filled 2018-06-16 (×3): qty 3

## 2018-06-16 MED ORDER — PIPERACILLIN-TAZOBACTAM 3.375 G IVPB 30 MIN: 3.3750 g | Freq: Four times a day (QID) | INTRAVENOUS | Status: DC

## 2018-06-16 MED ORDER — BISACODYL 10 MG RE SUPP
10.0000 mg | Freq: Every day | RECTAL | Status: DC | PRN
  Filled 2018-06-16: qty 1

## 2018-06-16 MED ORDER — ONDANSETRON HCL 4 MG/2ML IJ SOLN: 4.0000 mg | Freq: Four times a day (QID) | INTRAMUSCULAR | Status: DC | PRN

## 2018-06-16 MED ORDER — LORAZEPAM 2 MG/ML IJ SOLN
0.5000 mg | Freq: Once | INTRAMUSCULAR | Status: AC
  Administered 2018-06-16: 0.5 mg via INTRAVENOUS
  Filled 2018-06-16: qty 1

## 2018-06-16 MED ORDER — POLYETHYLENE GLYCOL 3350 17 G PO PACK: 17.0000 g | PACK | Freq: Every day | ORAL | Status: DC | PRN

## 2018-06-16 MED ORDER — ALBUTEROL SULFATE (2.5 MG/3ML) 0.083% IN NEBU
2.5000 mg | INHALATION_SOLUTION | Freq: Four times a day (QID) | RESPIRATORY_TRACT | Status: DC
  Filled 2018-06-16 (×3): qty 3

## 2018-06-16 MED ORDER — ADULT MULTIVITAMIN W/MINERALS CH
1.0000 | ORAL_TABLET | Freq: Every day | ORAL | Status: DC
  Administered 2018-06-17 – 2018-06-18 (×2): 1 via ORAL
  Filled 2018-06-16 (×2): qty 1

## 2018-06-16 MED ORDER — MORPHINE SULFATE (PF) 4 MG/ML IV SOLN
4.0000 mg | Freq: Once | INTRAVENOUS | Status: AC
  Administered 2018-06-16: 4 mg via INTRAVENOUS
  Filled 2018-06-16: qty 1

## 2018-06-16 MED ORDER — GADOBUTROL 1 MMOL/ML IV SOLN
5.0000 mL | Freq: Once | INTRAVENOUS | Status: AC | PRN
  Administered 2018-06-16: 5 mL via INTRAVENOUS

## 2018-06-16 MED ORDER — VANCOMYCIN HCL 500 MG IV SOLR
500.0000 mg | Freq: Two times a day (BID) | INTRAVENOUS | Status: DC
  Administered 2018-06-16: 500 mg via INTRAVENOUS
  Filled 2018-06-16 (×3): qty 500

## 2018-06-16 MED ORDER — SODIUM CHLORIDE 0.9% FLUSH: 10.0000 mL | INTRAVENOUS | Status: DC | PRN

## 2018-06-16 MED ORDER — ENSURE ENLIVE PO LIQD
237.0000 mL | Freq: Two times a day (BID) | ORAL | Status: DC
  Administered 2018-06-16 – 2018-06-18 (×3): 237 mL via ORAL

## 2018-06-16 MED ORDER — SODIUM CHLORIDE 0.9% FLUSH
10.0000 mL | Freq: Two times a day (BID) | INTRAVENOUS | Status: DC
  Administered 2018-06-18: 10 mL

## 2018-06-16 MED ORDER — GABAPENTIN 100 MG PO CAPS
200.0000 mg | ORAL_CAPSULE | Freq: Three times a day (TID) | ORAL | Status: DC
  Administered 2018-06-16 – 2018-06-18 (×6): 200 mg via ORAL
  Filled 2018-06-16 (×6): qty 2

## 2018-06-16 MED ORDER — CLONAZEPAM 1 MG PO TABS
1.0000 mg | ORAL_TABLET | Freq: Two times a day (BID) | ORAL | Status: DC
  Administered 2018-06-16 – 2018-06-18 (×5): 1 mg via ORAL
  Filled 2018-06-16 (×5): qty 1

## 2018-06-16 MED ORDER — ONDANSETRON HCL 4 MG PO TABS: 4.0000 mg | ORAL_TABLET | Freq: Four times a day (QID) | ORAL | Status: DC | PRN

## 2018-06-16 MED ORDER — SODIUM CHLORIDE 0.9 % IV BOLUS
1000.0000 mL | Freq: Once | INTRAVENOUS | Status: AC
  Administered 2018-06-16: 1000 mL via INTRAVENOUS

## 2018-06-16 MED ORDER — SODIUM CHLORIDE 0.9 % IV SOLN
INTRAVENOUS | Status: DC | PRN
  Administered 2018-06-16: 250 mL via INTRAVENOUS

## 2018-06-16 NOTE — ED Notes (Signed)
RN and MD talked to patient again. RN has talked at length about importance of test and Pt agrees to try again. Pt in MRI currently. This RN assisted with transfer to MRI stretcher and pt reported she could tolerate up to this point. Pt was sleeping in route to MRI with audible snoring. Pt is easy to arouse.

## 2018-06-16 NOTE — Consult Note (Signed)
NAME: Andrea King  DOB: 1975/09/15  MRN: [id number]  Date/Time: 06/16/2018 6:07 PM  REQUESTING PROVIDER: Dr. Jerelyn Charles Subjective:  REASON FOR CONSULT: Discitis and vertebral osteomyelitis ? Andrea King is a 43 y.o. female has a complicated medical history.  She is an IV drug user and has had multiple infections in the past few years. She  presents to the ED yesterday with low back pain.     Patient has a history of MRSA bacteria with septic emboli to the lungs in 2017 and was in [facility] but left AMA and ended up in [facility] in October 2019 and had strep sanguis bacteremia with tricuspid endocarditis and on 03/02/2069 she had tricuspid valve repair and a 30 mm ring annuloplasty performed as well as a PFO closure was done in the hospital. In August 2019 she presented to the [facility] ED again with fever and cervical spine pain and was found to have Serratia bacteremia and C5-6 discitis.  She was treated with IV cefepime but she left AMA on 01/11/2018.  During that time when she left AMA she was given a prescription for Levaquin and asked to take it as outpatient which she did not.  she continued to do intravenous drugs and presented to the [facility] ED again on 03/03/2018 with neck and back pain fever and tingling and numbness in her hands and leg.  She had MRSA bacteremia with extensive para spinous phlegmon and infection with superimposed abscess involving C2-3 through T4-5.  She was seen by neurosurgery and was transferred to [facility] for further management. She stayed in [place] between 03/05/2018 until 05/03/2018 and her course that was complicated.  On 03/17/2018 she underwent a C5-C6 corpectomy and C4-C7 fusion.  Tissue culture was negative. she had to be taken back for surgery on 04/06/2018 for hardware migration and unstable cervical spine and had a posterior intervention.  Culture taken from the tissue then also was negative. Patient was initially on vancomycin and ceftaroline and the latter was stopped because of  AKI.  She had intermittent fever during that hospitalization and multiple blood cultures were done and intermittently it was positive for various organisms like Serratia and enterococcus raising suspicion of manipulation of the PICC line See the culture results below. BC 11/2, 11/3, 11/5 MRSA BC 11/6 enterococcus fecalis- S to amp BC 11/8, 11/9, 11/11  NG BC 11/17 MRSA BC 11/18, 11/19, 11/21,11/22 NG BC 11/28 serratia ( S to PIP tazo, Cefepime, cipro  R to ceftriaxone [facility] 12/2,12/11, 12/18 and 12/27 NG  She was also having low back pain and lumbar osteomyelitis and phlegmon was noted on the MRI of the lumbar spine from 11/18, 12/13 and 12/29.  This was not amenable to IR drainage.  Neurosurgery did not have plans for surgical intervention.  So she was managed by antibiotics.  She received vancomycin throughout the entire stay and initially ceftaroline and later cefepime (11/29-12/14) was given. She had acute decline with sepsis and altered mental status on 04/13/2018 it was thought to be secondary to septic emboli to the lungs.  PICC line was replaced on 04/05/2018.  CT surgery saw her but did not think she was a candidate for tricuspid valve replacement.  She did have low-grade fever on 1217 was briefly back on cefepime but it was taken off after blood culture from 1218 was negative.  She continued on vancomycin monotherapy.  She again was febrile on 12/26 and 12/27 however blood cultures were negative.  MRI spine was unchanged.  Bilateral septic  arthritis of the shoulders were considered but felt unlikely by Ortho.  And also bilateral renal lesion suggestive of focal pyelonephritis with his abscesses was also considered to be a cause for her fever.  There was also suspicion for empyema but though insufficient fluid to safely tap.  The vancomycin ended on 05/03/2018 and she was sent home with suppressive doxycycline indefinitely.  She had a follow-up appointment with a neurosurgeon for 05/24/2018 but she  was late for the appointment and hence could not see the physician.  She did say that with doxycycline she was doing well with no fever and the back pain was much better.  She did not refill the prescription for doxycycline 2 weeks ago.  The back pain started to get worse last week.  She started to inject IV heroin again as she did not have pain medication she says.  She is now admitted to the hospital with worsening back pain, fever and chills.  On admission to the ED her temperature was 99.7 but later it went up to 102.7. On admission WBC was 19.8 and it was 9.4 on 05/02/2018 before discharge from [facility].  Creatinine was 0.89.   MRI of the entire spine was done and that showed discitis osteomyelitis at L5-S1 with ventral epidural phlegmon/abscess at the L5 and S1 levels causing mild spinal canal stenosis and moderate bilateral foraminal narrowing.  MRI of the cervical spine showed Status post long segment anterior and posterior spinal fusion with anterior hardware from C3-C7 and posterior hardware from C2-T2. No evidence of active discitis-osteomyelitis  Seen by neurosurgery who did not think she was a candidate for surgery.  Patient complains of back pain she has had drenching sweats and fever.  She states she is going to leave AMA as she is not getting enough pain medication here. She has no cough She does not have any abdominal pain or diarrhea or rash or dysuria or difficulty passing urine.  She does not have weakness in her legs.  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 Cancer Mother   . Cancer - Colon Maternal Grandfather   . Diabetes Maternal Grandfather    No Known Allergies ? Current Facility-Administered Medications  Medication Dose Route Frequency Provider Last Rate Last Dose  . acetaminophen (TYLENOL) tablet 650 mg  650 mg Oral Q6H PRN Gladstone Lighter, MD      . albuterol (PROVENTIL) (2.5 MG/3ML) 0.083% nebulizer solution 2.5 mg  2.5 mg Nebulization Q6H Salary, Montell D, MD      . bisacodyl (DULCOLAX) suppository 10 mg  10 mg Rectal Daily PRN Salary, Montell D, MD      . clonazePAM (KLONOPIN) tablet 1 mg  1 mg Oral BID Gladstone Lighter, MD   1 mg at 06/16/18 1527  . dextrose 5 %-0.45 % sodium chloride infusion   Intravenous Continuous Salary, Montell D, MD 100 mL/hr at 06/16/18 1700    . docusate sodium (COLACE) capsule 100 mg  100 mg Oral BID Salary, Avel Peace, MD      .  feeding supplement (ENSURE ENLIVE) (ENSURE ENLIVE) liquid 237 mL  237 mL Oral BID BM Gladstone Lighter, MD   237 mL at 06/16/18 1437  . gabapentin (NEURONTIN) capsule 200 mg  200 mg Oral TID Gladstone Lighter, MD   200 mg at 06/16/18 1528  . HYDROcodone-acetaminophen (NORCO/VICODIN) 5-325 MG per tablet 1-2 tablet  1-2 tablet Oral Q4H PRN Salary, Avel Peace, MD   2 tablet at 06/16/18 1402  . methadone (DOLOPHINE) tablet 30 mg  30 mg Oral Daily Gladstone Lighter, MD   30 mg at 06/16/18 1526  . [START ON 06/17/2018] multivitamin with minerals tablet 1 tablet  1 tablet Oral Daily Gladstone Lighter, MD      . ondansetron (ZOFRAN) tablet 4 mg  4 mg Oral Q6H PRN Salary, Montell D, MD       Or  . ondansetron (ZOFRAN) injection 4 mg  4 mg Intravenous Q6H PRN Salary, Montell D, MD      . piperacillin-tazobactam (ZOSYN) IVPB 3.375 g  3.375 g Intravenous Q8H Salary, Montell D, MD 12.5 mL/hr at 06/16/18 1700    . polyethylene glycol (MIRALAX / GLYCOLAX) packet 17 g  17 g  Oral Daily PRN Salary, Montell D, MD      . sodium phosphate (FLEET) 7-19 GM/118ML enema 1 enema  1 enema Rectal Once PRN Salary, Montell D, MD      . traZODone (DESYREL) tablet 50 mg  50 mg Oral QHS PRN Salary, Montell D, MD      . vancomycin (VANCOCIN) 1,250 mg in sodium chloride 0.9 % 250 mL IVPB  1,250 mg Intravenous Q24H Dallie Piles, RPH      . vitamin C (ASCORBIC ACID) tablet 250 mg  250 mg Oral BID Gladstone Lighter, MD         Abtx:  Anti-infectives (From admission, onward)   Start     Dose/Rate Route Frequency Ordered Stop   06/16/18 1800  vancomycin (VANCOCIN) 1,250 mg in sodium chloride 0.9 % 250 mL IVPB     1,250 mg 166.7 mL/hr over 90 Minutes Intravenous Every 24 hours 06/16/18 1505     06/16/18 1300  vancomycin (VANCOCIN) 500 mg in sodium chloride 0.9 % 100 mL IVPB  Status:  Discontinued     500 mg 100 mL/hr over 60 Minutes Intravenous Every 12 hours 06/16/18 0551 06/16/18 1505   06/16/18 0600  ceFEPIme (MAXIPIME) 1 g in sodium chloride 0.9 % 100 mL IVPB  Status:  Discontinued     1 g 200 mL/hr over 30 Minutes Intravenous Every 8 hours 06/16/18 0551 06/16/18 0557   06/16/18 0600  piperacillin-tazobactam (ZOSYN) IVPB 3.375 g  Status:  Discontinued    Note to Pharmacy:  Spinal abscess   3.375 g 100 mL/hr over 30 Minutes Intravenous Every 6 hours 06/16/18 0554 06/16/18 0557   06/16/18 0600  piperacillin-tazobactam (ZOSYN) IVPB 3.375 g     3.375 g 12.5 mL/hr over 240 Minutes Intravenous Every 8 hours 06/16/18 0557     06/15/18 2300  vancomycin (VANCOCIN) 1,250 mg in sodium chloride 0.9 % 250 mL IVPB     1,250 mg 166.7 mL/hr over 90 Minutes Intravenous  Once 06/15/18 2259 06/16/18 0427   06/15/18 2230  ceFEPIme (MAXIPIME) 2 g in sodium chloride 0.9 % 100 mL IVPB     2 g 200 mL/hr over 30 Minutes Intravenous  Once 06/15/18 2224 06/15/18 2314   06/15/18 2230  metroNIDAZOLE (FLAGYL) IVPB 500 mg  Status:  Discontinued  500 mg 100 mL/hr over 60 Minutes Intravenous Every  8 hours 06/15/18 2224 06/16/18 0557   06/15/18 2230  vancomycin (VANCOCIN) IVPB 1000 mg/200 mL premix  Status:  Discontinued     1,000 mg 200 mL/hr over 60 Minutes Intravenous  Once 06/15/18 2224 06/15/18 2258      REVIEW OF SYSTEMS:  Const:  fever,  chills,  weight loss Eyes: negative diplopia or visual changes, negative eye pain ENT: negative coryza, negative sore throat Resp: negative cough, hemoptysis, dyspnea Cards: negative for chest pain, palpitations, lower extremity edema GU: negative for frequency, dysuria and hematuria GI: Negative for abdominal pain, diarrhea, bleeding, constipation Skin: negative for rash and pruritus Heme: negative for easy bruising and gum/nose bleeding MS: Positive for myalgias, arthralgias, back pain and muscle weakness Neurolo: Positive for headaches, no dizziness, vertigo, memory problems  Psych: Anxiety Endocrine: No polyuria or polydipsia Allergy/Immunology-no medication allergy: Objective:  VITALS:  BP 117/65 (BP Location: Right Arm)   Pulse (!) 105   Temp (!) 102.7 F (39.3 C) (Oral)   Resp 20   Ht '5\' 2"'$  (1.575 m)   Wt 51.3 kg   LMP  (LMP Unknown)   SpO2 99%   BMI 20.67 kg/m  PHYSICAL EXAM:  General: Was sleeping, woke up when I called her, somewhat cooperative, in distress , appears stated age.  Head: Normocephalic, without obvious abnormality, atraumatic. Eyes: Conjunctivae clear, anicteric sclerae. Pupils are equal ENT Nares normal. No drainage or sinus tenderness. Lips, mucosa, and tongue normal. No Thrush Neck: Posterior and anterior scar present Back: No CVA tenderness. Lungs: Bilateral air entry Heart: S1-S2 tachycardia systolic murmur Abdomen: Soft, non-tender,not distended. Bowel sounds normal. No masses Extremities: atraumatic, no cyanosis. No edema. No clubbing Skin: No rashes or lesions. Or bruising Lymph: Cervical, supraclavicular normal. Neurologic: Grossly non-focal Pertinent Labs Lab Results CBC    Component  Value Date/Time   WBC 19.8 (H) 06/15/2018 2158   RBC 4.01 06/15/2018 2158   HGB 10.3 (L) 06/15/2018 2158   HCT 33.2 (L) 06/15/2018 2158   PLT 351 06/15/2018 2158   MCV 82.8 06/15/2018 2158   MCH 25.7 (L) 06/15/2018 2158   MCHC 31.0 06/15/2018 2158   RDW 17.0 (H) 06/15/2018 2158   LYMPHSABS 2.3 06/15/2018 2158   MONOABS 1.2 (H) 06/15/2018 2158   EOSABS 0.1 06/15/2018 2158   BASOSABS 0.1 06/15/2018 2158    CMP Latest Ref Rng & Units 06/15/2018 03/03/2018 01/11/2018  Glucose 70 - 99 mg/dL 112(H) 161(H) -  BUN 6 - 20 mg/dL 16 37(H) -  Creatinine 0.44 - 1.00 mg/dL 0.89 1.31(H) 0.60  Sodium 135 - 145 mmol/L 133(L) 128(L) -  Potassium 3.5 - 5.1 mmol/L 3.9 3.1(L) -  Chloride 98 - 111 mmol/L 102 94(L) -  CO2 22 - 32 mmol/L 24 22 -  Calcium 8.9 - 10.3 mg/dL 9.1 8.5(L) -  Total Protein 6.5 - 8.1 g/dL 8.2(H) 7.2 -  Total Bilirubin 0.3 - 1.2 mg/dL 1.0 0.5 -  Alkaline Phos 38 - 126 U/L 62 144(H) -  AST 15 - 41 U/L 11(L) 26 -  ALT 0 - 44 U/L 8 11 -      Microbiology: Recent Results (from the past 240 hour(s))  Culture, blood (Routine x 2)     Status: None (Preliminary result)   Collection Time: 06/15/18  9:59 PM  Result Value Ref Range Status   Specimen Description BLOOD BLOOD LEFT FOREARM  Final   Special Requests   Final  BOTTLES DRAWN AEROBIC AND ANAEROBIC Blood Culture adequate volume   Culture   Final    NO GROWTH < 12 HOURS Performed at [facility], [address] [place], [place] [address]    Report Status PENDING  Incomplete  Culture, blood (Routine x 2)     Status: None (Preliminary result)   Collection Time: 06/15/18  9:59 PM  Result Value Ref Range Status   Specimen Description BLOOD BLOOD RIGHT FOREARM  Final   Special Requests   Final    BOTTLES DRAWN AEROBIC AND ANAEROBIC Blood Culture adequate volume   Culture   Final    NO GROWTH < 12 HOURS Performed at [facility], [address] [place], [place] [address]    Report Status PENDING   Incomplete    IMAGING RESULTS: I have personally reviewed the films with radiologist L5-S1 discitis and osteomyelitis  ? Impression/Recommendation ?43 year old female with a complicated medical history.  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 ?Discitis and osteomyelitis of L5-S1 area.  This is not new that it was seen on the MRI from 03/17/2018 at [facility] when she was getting treatment for MRSA with vancomycin and also was on ceftaroline and then cefepime.  She now has worsening low back pain and fever which could be because she stopped doxycycline last week or it could be that this is a new infection.  Discussed with radiologist to see whether they would be able to aspirate the disc space.  IR will review tomorrow   Recent MRSA bacteremia with extensive discitis of the cervical spine leading to anterior and posterior fusion extending from C2-T2 Treated with IV vancomycin for at least 8 weeks followed by oral doxycycline which she has to take indefinitely but she ran out of it 2 weeks ago   Recent bilateral septic arthritis of the knee status post washout MRSA in the left knee.  Recent Serratia bacteremia treated with nearly 4 weeks  of IV antibiotic  Recent enterococcus bacteremia which was treated with IV vancomycin as well  History of tricuspid valve endocarditis status post TAVR in 2017  She is currently on vancomycin and Zosyn we will keep a close eye on the creatinine may be switching the Zosyn to cefepime soon.  We will keep her n.p.o. after midnight with the hope that IR may be able to aspirate the disc tomorrow. ___________________________________________________ Discussed with patient, and her nurse note:  This document was prepared using Dragon voice recognition software and may  include unintentional dictation errors.

## 2018-06-16 NOTE — Progress Notes (Signed)
Pt complained of increased back pain. Pt requesting a heating pad. Primary nurse paged and spoke to Dr. Desiree Hane. Orders received for Kpad

## 2018-06-16 NOTE — ED Notes (Signed)
Pt verbalized she does not want the MRI today. She is stating she can not tolerate the pain and "I just want to go to sleep." Pt requesting water and the MRI to be performed tomorrow, "when there is another doctor around"

## 2018-06-16 NOTE — Progress Notes (Signed)
CODE SEPSIS - PHARMACY COMMUNICATION  **Broad Spectrum Antibiotics should be administered within 1 hour of Sepsis diagnosis**  Time Code Sepsis Called/Page Received: n/a  Antibiotics Ordered: vanc/cefepime/flagyl  Time of 1st antibiotic administration: 2241  Additional action taken by pharmacy:   If necessary, Name of Provider/Nurse Contacted:     Thomasene Rippleavid  Javonnie Illescas ,PharmD Clinical Pharmacist  06/16/2018  2:41 AM

## 2018-06-16 NOTE — ED Notes (Signed)
MRI tech at bedside to take pt. Pt is difficult to arouse and altered from baseline of alert and constant verbalizations. After multiple attempts by staff pt is sitting up and able to talk to staff without difficulty. Pt reporting pain has returned and she will need medications for her claustrophobia to get through the MRI. MD to bedside to talk to pt about need for MRI but decreased LOC and decreased BP inhibits pt from receiving more medications. Pt frustrated but calm and cooperative.

## 2018-06-16 NOTE — ED Notes (Signed)
While in rm with pt, this tech as well as Melanie,EDT observed pt with pill bottles in her hand from her purse. Informed pt that she could not have any medicine besides what she is given here. Pt also made aware that she is NPO at this time. Purse was taken away from pt and placed in a pt belongings bag that was tied in a knot several times. Megan,RN made aware that pt is trying to take medicine that is in her purse. Pt was repositioned on her left side then rolled onto her back. Pt started getting agitated stating "put me on my side." Pt than placed on left side where pt is still not satisfied. Pt stated "I hate this hospital, I am going somewhere else." When pt was asked [facility]

## 2018-06-16 NOTE — Progress Notes (Signed)
   06/16/18 1000  Clinical Encounter Type  Visited With Patient not available;Health care provider  Visit Type Initial  Referral From Physician   Per nurse, patient is resting and would be better served to visit at a later time. Will follow up later.

## 2018-06-16 NOTE — ED Notes (Signed)
This RN paged Dr. Nemiah Commander regarding patient having medications in her bag and suspicion of double dosing her pain medication. Per Dr. Nemiah Commander hold PRN meds until 1300 today. Explained to patient that per orders she is NPO except sips with meds.

## 2018-06-16 NOTE — ED Provider Notes (Signed)
I assumed care of the patient from Dr. Pershing Proud at 11:00 PM.  When I evaluated the patient patient markedly somnolent falling asleep during conversation unable to stay awake.  When I informed the patient that she was going to be going to MRI patient requested sedation before MRI would be performed.  Patient given 0.5 mg of Ativan and then subsequently when patient arrived to MRI she refused to have it performed stating that she needed pain medications before it could be done.  Patient subsequently given IV morphine 4 mg.  Patient then requested to not have the MRI performed stating "can I just have it done tomorrow".  I spoke with the patient at length and informed her of the necessity of the study and after multiple lengthy conversations the patient agreed to have it performed.  MRI revealed discitis/osteomyelitis with phlegmon/abscess formation at L5-S1 with resultant mild spinal canal stenosis and moderate bilateral foraminal narrowing.  Patient with no neurological deficits at this time.  Patient discussed with Dr. Marcell Barlow neurosurgeon on-call who stated that the patient could be admitted here at [facility] for IV antibiotic therapy and that he would evaluate the patient in this morning.   Darci Current, MD 06/16/18 [id number]

## 2018-06-16 NOTE — ED Notes (Addendum)
Informed by Radiology that pt has refused x-rays as ordered because she wants "more pain medicine". This RN at bedside to explain to her that she was just given the PRN pain medications that were ordered for her, and that they were released by this RN to accommodate her request. Pt still unhappy, stating "these won't do shit". Pt still continues to request more/stronger pain medications and refusing to be taken to x-ray at this time.

## 2018-06-16 NOTE — Progress Notes (Signed)
Pharmacy Antibiotic Note  ADELINE King is a 43 y.o. female admitted on 06/15/2018 with osteomyelitis.  Pharmacy has been consulted for vanc/cefepime dosing.  Plan: Patient received vanc 1.25g IV load ing in ED Vancomycin 500 mg IV Q 12 hrs. Goal AUC 400-550. Expected AUC: 470.3 SCr used: 13.6 mg/dL Css trough 76.8 mcg/mL  Will continue cefepime 1g IV q8h  Height: 5\' 2"  (157.5 cm) Weight: 113 lb (51.3 kg) IBW/kg (Calculated) : 50.1  Temp (24hrs), Avg:99.7 F (37.6 C), Min:99.7 F (37.6 C), Max:99.7 F (37.6 C)  Recent Labs  Lab 06/15/18 2158 06/15/18 2159  WBC 19.8*  --   CREATININE 0.89  --   LATICACIDVEN  --  1.1    Estimated Creatinine Clearance: 63.8 mL/min (by C-G formula based on SCr of 0.89 mg/dL).    No Known Allergies  Thank you for allowing pharmacy to be a part of this patient's care.  Thomasene Ripple, PharmD, BCPS Clinical Pharmacist 06/16/2018

## 2018-06-16 NOTE — Progress Notes (Signed)
Pharmacy Antibiotic Note  Andrea King is a 43 y.o. female admitted on 06/15/2018 with osteomyelitis of L5-S1.  Pharmacy has been consulted for vancomycin dosing. Additionally she is on Zosyn. She received vanc 1.25g IV load ing in ED last night and an additional 500 mg this afternoon  Plan: 1) Increase vancomycin dose to 1250 mg IV Q 24 hrs. Goal AUC 400-550. Expected AUC: 531 SCr used: 0.89 mg/dL  2) continue Zosyn 0.383 g EI IV q8h  Height: 5\' 2"  (157.5 cm) Weight: 113 lb (51.3 kg) IBW/kg (Calculated) : 50.1  Temp (24hrs), Avg:101.2 F (38.4 C), Min:99.7 F (37.6 C), Max:102.7 F (39.3 C)  Recent Labs  Lab 06/15/18 2158 06/15/18 2159  WBC 19.8*  --   CREATININE 0.89  --   LATICACIDVEN  --  1.1    Estimated Creatinine Clearance: 63.8 mL/min (by C-G formula based on SCr of 0.89 mg/dL).    No Known Allergies  Thank you for allowing pharmacy to be a part of this patient's care.  Burnis Medin, PharmD Clinical Pharmacist 06/16/2018

## 2018-06-16 NOTE — ED Notes (Signed)
Patient transported to MRI 

## 2018-06-16 NOTE — ED Notes (Signed)
Pt has returned from MRI reporting she is in too much pain to go through with MRI. MD at bedside.

## 2018-06-16 NOTE — ED Notes (Signed)
Pt put on bedpan by this tech and Gwynneth Munson, Charity fundraiser. Notified pt to use call bell when done so that she can proceed to getting X-ray exam.

## 2018-06-16 NOTE — Progress Notes (Signed)
Palliative care:  Consult received and chart reviewed. PMT has seen this patient previously and she refused to talk to [place]. Per chart review, she is refusing to speak to other care team members and refusing care offered. Discussed patient with Dr. Nemiah Commander - will hold off on palliative care consult at this time. Please call if we can be of assistance in the future.  Thank you.  Gerlean Ren, DNP, AGNP-C Palliative Medicine Team Team Phone # [phone]  Pager # [phone]  NO CHARGE

## 2018-06-16 NOTE — H&P (Signed)
[employer] [facility] [place] [facility]   PATIENT NAME: Andrea King    MR#:  [id number]  DATE OF BIRTH:  03/04/1976  DATE OF ADMISSION:  06/15/2018  PRIMARY CARE PHYSICIAN: Alberteen Spindle, CNM   REQUESTING/REFERRING PHYSICIAN:   CHIEF COMPLAINT:   Chief Complaint  Patient presents with  . Back Pain    HISTORY OF PRESENT ILLNESS: Andrea King  is a 43 y.o. 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 Marcell Barlow who recommended admission to our hospital for continued care, urine drug screen noted for benzos/cannabinoids/opioids, white count of 19,000, UA negative, patient evaluated in the emergency room, no apparent distress, resting comfortably in bed, patient is now been admitted for acute recurrent sepsis secondary to acute recurrent L5/S1 spinal abscess.  PAST MEDICAL HISTORY:   Past Medical History:  Diagnosis Date  . Anemia   . Asthma   . DJD (degenerative joint disease)   . Hepatitis C   . Substance abuse (HCC)     PAST SURGICAL HISTORY:  Past Surgical History:  Procedure Laterality Date  . INGUINAL HERNIA REPAIR Left 04/28/2013   Procedure: HERNIA REPAIR INGUINAL ADULT with mesh;  Surgeon: Cherylynn Ridges, MD;  Location: [facility] OR;  Service: General;   Laterality: Left;  . TEE WITHOUT CARDIOVERSION N/A 11/18/2015   Procedure: TRANSESOPHAGEAL ECHOCARDIOGRAM (TEE);  Surgeon: Iran Ouch, MD;  Location: [facility] ORS;  Service: Cardiovascular;  Laterality: N/A;  . TEE WITHOUT CARDIOVERSION N/A 12/31/2017   Procedure: TRANSESOPHAGEAL ECHOCARDIOGRAM (TEE);  Surgeon: Dalia Heading, MD;  Location: [facility] ORS;  Service: Cardiovascular;  Laterality: N/A;  . TEE WITHOUT CARDIOVERSION N/A 01/04/2018   Procedure: TRANSESOPHAGEAL ECHOCARDIOGRAM (TEE);  Surgeon: Dalia Heading, MD;  Location: [facility] ORS;  Service: Cardiovascular;  Laterality: N/A;  . TUMOR EXCISION     left scalpula    SOCIAL HISTORY:  Social History   Tobacco Use  . Smoking status: Current Every Day Smoker    Types: Cigarettes  . Smokeless tobacco: Never Used  Substance Use Topics  . Alcohol use: No    Alcohol/week: 0.0 standard drinks    FAMILY HISTORY:  Family History  Problem Relation Age of Onset  . Cancer Mother   . Cancer - Colon Maternal Grandfather   . Diabetes Maternal Grandfather     DRUG ALLERGIES: No Known Allergies  REVIEW OF SYSTEMS:   CONSTITUTIONAL: + fever, fatigue, weakness.  EYES: No blurred or double vision.  EARS, NOSE, AND THROAT: No tinnitus or ear pain.  RESPIRATORY: No cough, shortness of breath, wheezing or hemoptysis.  CARDIOVASCULAR: No chest pain, orthopnea, edema.  GASTROINTESTINAL: No nausea, vomiting, diarrhea or abdominal pain.  GENITOURINARY: No dysuria, hematuria.  ENDOCRINE: No polyuria, nocturia,  HEMATOLOGY: No anemia, easy bruising or bleeding SKIN: No rash or lesion. MUSCULOSKELETAL: Low back pain, neck pain    NEUROLOGIC: No tingling, numbness, weakness.  PSYCHIATRY: No  anxiety or depression.   MEDICATIONS AT HOME:  Prior to Admission medications   Medication Sig Start Date End Date Taking? Authorizing Provider  acetaminophen (TYLENOL) 500 MG tablet Take 500-1,000 mg by mouth every 6 (six) hours as needed for mild pain or  moderate pain.    [provider]      PHYSICAL EXAMINATION:   VITAL SIGNS: Blood pressure 130/73, pulse (!) 118, temperature 99.7 F (37.6 C), temperature source Oral, resp. rate 17, height 5\' 2"  (1.575 m), weight 51.3 kg, SpO2 98 %.  GENERAL:  43 y.o.-year-old patient lying in the bed with no acute distress.  Malnourished appearance EYES: Pupils equal, round, reactive to light and accommodation. No scleral icterus. Extraocular muscles intact.  HEENT: Head atraumatic, normocephalic. Oropharynx and nasopharynx clear.  NECK:  Supple, no jugular venous distention. No thyroid enlargement, no tenderness.  LUNGS: Normal breath sounds bilaterally, no wheezing, rales,rhonchi or crepitation. No use of accessory muscles of respiration.  CARDIOVASCULAR: S1, S2 normal. No murmurs, rubs, or gallops.  ABDOMEN: Soft, nontender, nondistended. Bowel sounds present. No organomegaly or mass.  Diffuse spinal tenderness on palpation EXTREMITIES: No pedal edema, cyanosis, or clubbing.  NEUROLOGIC: Cranial nerves II through XII are intact. MAES. Gait not checked.  PSYCHIATRIC: The patient is alert and oriented x 3.  SKIN: No obvious rash, lesion, or ulcer.   LABORATORY PANEL:   CBC Recent Labs  Lab 06/15/18 2158  WBC 19.8*  HGB 10.3*  HCT 33.2*  PLT 351  MCV 82.8  MCH 25.7*  MCHC 31.0  RDW 17.0*  LYMPHSABS 2.3  MONOABS 1.2*  EOSABS 0.1  BASOSABS 0.1   ------------------------------------------------------------------------------------------------------------------  Chemistries  Recent Labs  Lab 06/15/18 2158  NA 133*  K 3.9  CL 102  CO2 24  GLUCOSE 112*  BUN 16  CREATININE 0.89  CALCIUM 9.1  AST 11*  ALT 8  ALKPHOS 62  BILITOT 1.0   ------------------------------------------------------------------------------------------------------------------ estimated creatinine clearance is 63.8 mL/min (by C-G formula based on SCr of 0.89  mg/dL). ------------------------------------------------------------------------------------------------------------------ No results for input(s): TSH, T4TOTAL, T3FREE, THYROIDAB in the last 72 hours.  Invalid input(s): FREET3   Coagulation profile Recent Labs  Lab 06/15/18 2158  INR 1.05   ------------------------------------------------------------------------------------------------------------------- No results for input(s): DDIMER in the last 72 hours. -------------------------------------------------------------------------------------------------------------------  Cardiac Enzymes No results for input(s): CKMB, TROPONINI, MYOGLOBIN in the last 168 hours.  Invalid input(s): CK ------------------------------------------------------------------------------------------------------------------ Invalid input(s): POCBNP  ---------------------------------------------------------------------------------------------------------------  Urinalysis    Component Value Date/Time   COLORURINE YELLOW (A) 06/15/2018 2159   APPEARANCEUR CLEAR (A) 06/15/2018 2159   LABSPEC 1.009 06/15/2018 2159   PHURINE 5.0 06/15/2018 2159   GLUCOSEU NEGATIVE 06/15/2018 2159   HGBUR LARGE (A) 06/15/2018 2159   BILIRUBINUR NEGATIVE 06/15/2018 2159   KETONESUR NEGATIVE 06/15/2018 2159   PROTEINUR 30 (A) 06/15/2018 2159   UROBILINOGEN 0.2 04/27/2013 2330   NITRITE NEGATIVE 06/15/2018 2159   LEUKOCYTESUR NEGATIVE 06/15/2018 2159     RADIOLOGY: Mr Cervical Spine W Or Wo Contrast  Result Date: 06/16/2018 CLINICAL DATA:  Fever. Severe neck and low back pain. Recent cervical fusion for discitis-osteomyelitis. EXAM: MRI CERVICAL, THORACIC AND LUMBAR SPINE WITH AND WITHOUT CONTRAST TECHNIQUE: Multiplanar and multiecho pulse sequences of the cervical spine, to include the craniocervical junction and cervicothoracic junction, and thoracic and lumbar spine, were obtained with without intravenous contrast.  COMPARISON:  Cervical spine MRI 03/03/2018 CONTRAST:  5 mL Gadavist FINDINGS: MRI CERVICAL SPINE FINDINGS Alignment: Physiologic. Vertebrae: Status post long segment anterior and posterior spinal  fusion with anterior hardware from C3-C7 and posterior hardware from C2-T2. No evidence of active discitis-osteomyelitis. Cord: Spinal cord signal is normal.  No epidural collection. Posterior Fossa, vertebral arteries, paraspinal tissues: Opacities in the lung apices are poorly characterized by MRI. Disc levels: There is no spinal canal stenosis or neural foraminal stenosis. MRI THORACIC SPINE FINDINGS Alignment:  Physiologic. Vertebrae: No fracture, evidence of discitis, or bone lesion. Cord:  Normal signal and morphology. Paraspinal and other soft tissues: Poorly visualized multifocal pulmonary opacities. Disc levels: No spinal canal or neural foraminal stenosis. MRI LUMBAR SPINE FINDINGS Segmentation:  Standard. Alignment:  Grade 1 anterolisthesis at L5-S1. Vertebrae: There is low T1-weighted signal within the endplates at L5-S1 with moderate contrast-enhancement. There is edema within the L5-S1 disc space. The pattern is consistent with discitis-osteomyelitis. Conus medullaris and cauda equina: Conus extends to the T12-L1 level. Conus and cauda equina appear normal. Paraspinal and other soft tissues: There is mild contrast enhancement of the prevertebral soft tissues at the L5 and sacral levels. There is ventral epidural contrast enhancement at the L5 and S1 levels. Disc levels: No spinal canal or neural foraminal stenosis above the L4-5 level. L5-S1: Grade 1 anterolisthesis. Mild narrowing of the spinal canal due to the enhancing ventral epidural tissue. Abnormal enhancement extends into both neural foramina, which are moderately narrowed. IMPRESSION: 1. Discitis-osteomyelitis at L5-S1 with ventral epidural phlegmon/abscess at the L5 and S1 levels causing mild spinal canal stenosis and moderate bilateral foraminal  narrowing. 2. Prevertebral soft tissue enhancement at the L5 and sacral levels, likely paravertebral phlegmon. No psoas abscess. 3. No cervical or thoracic discitis-osteomyelitis or epidural collection. 4. Long segment cervical anterior and posterior fusion without spinal canal stenosis. 5. Poorly characterized multifocal pulmonary opacities. Correlation with chest radiography or chest CT is recommended. Electronically Signed   By: Deatra Robinson M.D.   On: 06/16/2018 03:24   Mr Thoracic Spine W Wo Contrast  Result Date: 06/16/2018 CLINICAL DATA:  Fever. Severe neck and low back pain. Recent cervical fusion for discitis-osteomyelitis. EXAM: MRI CERVICAL, THORACIC AND LUMBAR SPINE WITH AND WITHOUT CONTRAST TECHNIQUE: Multiplanar and multiecho pulse sequences of the cervical spine, to include the craniocervical junction and cervicothoracic junction, and thoracic and lumbar spine, were obtained with without intravenous contrast. COMPARISON:  Cervical spine MRI 03/03/2018 CONTRAST:  5 mL Gadavist FINDINGS: MRI CERVICAL SPINE FINDINGS Alignment: Physiologic. Vertebrae: Status post long segment anterior and posterior spinal fusion with anterior hardware from C3-C7 and posterior hardware from C2-T2. No evidence of active discitis-osteomyelitis. Cord: Spinal cord signal is normal.  No epidural collection. Posterior Fossa, vertebral arteries, paraspinal tissues: Opacities in the lung apices are poorly characterized by MRI. Disc levels: There is no spinal canal stenosis or neural foraminal stenosis. MRI THORACIC SPINE FINDINGS Alignment:  Physiologic. Vertebrae: No fracture, evidence of discitis, or bone lesion. Cord:  Normal signal and morphology. Paraspinal and other soft tissues: Poorly visualized multifocal pulmonary opacities. Disc levels: No spinal canal or neural foraminal stenosis. MRI LUMBAR SPINE FINDINGS Segmentation:  Standard. Alignment:  Grade 1 anterolisthesis at L5-S1. Vertebrae: There is low T1-weighted  signal within the endplates at L5-S1 with moderate contrast-enhancement. There is edema within the L5-S1 disc space. The pattern is consistent with discitis-osteomyelitis. Conus medullaris and cauda equina: Conus extends to the T12-L1 level. Conus and cauda equina appear normal. Paraspinal and other soft tissues: There is mild contrast enhancement of the prevertebral soft tissues at the L5 and sacral levels. There is ventral epidural contrast enhancement at the L5  and S1 levels. Disc levels: No spinal canal or neural foraminal stenosis above the L4-5 level. L5-S1: Grade 1 anterolisthesis. Mild narrowing of the spinal canal due to the enhancing ventral epidural tissue. Abnormal enhancement extends into both neural foramina, which are moderately narrowed. IMPRESSION: 1. Discitis-osteomyelitis at L5-S1 with ventral epidural phlegmon/abscess at the L5 and S1 levels causing mild spinal canal stenosis and moderate bilateral foraminal narrowing. 2. Prevertebral soft tissue enhancement at the L5 and sacral levels, likely paravertebral phlegmon. No psoas abscess. 3. No cervical or thoracic discitis-osteomyelitis or epidural collection. 4. Long segment cervical anterior and posterior fusion without spinal canal stenosis. 5. Poorly characterized multifocal pulmonary opacities. Correlation with chest radiography or chest CT is recommended. Electronically Signed   By: Deatra RobinsonKevin  Herman M.D.   On: 06/16/2018 03:24   Mr Lumbar Spine W Wo Contrast  Result Date: 06/16/2018 CLINICAL DATA:  Fever. Severe neck and low back pain. Recent cervical fusion for discitis-osteomyelitis. EXAM: MRI CERVICAL, THORACIC AND LUMBAR SPINE WITH AND WITHOUT CONTRAST TECHNIQUE: Multiplanar and multiecho pulse sequences of the cervical spine, to include the craniocervical junction and cervicothoracic junction, and thoracic and lumbar spine, were obtained with without intravenous contrast. COMPARISON:  Cervical spine MRI 03/03/2018 CONTRAST:  5 mL  Gadavist FINDINGS: MRI CERVICAL SPINE FINDINGS Alignment: Physiologic. Vertebrae: Status post long segment anterior and posterior spinal fusion with anterior hardware from C3-C7 and posterior hardware from C2-T2. No evidence of active discitis-osteomyelitis. Cord: Spinal cord signal is normal.  No epidural collection. Posterior Fossa, vertebral arteries, paraspinal tissues: Opacities in the lung apices are poorly characterized by MRI. Disc levels: There is no spinal canal stenosis or neural foraminal stenosis. MRI THORACIC SPINE FINDINGS Alignment:  Physiologic. Vertebrae: No fracture, evidence of discitis, or bone lesion. Cord:  Normal signal and morphology. Paraspinal and other soft tissues: Poorly visualized multifocal pulmonary opacities. Disc levels: No spinal canal or neural foraminal stenosis. MRI LUMBAR SPINE FINDINGS Segmentation:  Standard. Alignment:  Grade 1 anterolisthesis at L5-S1. Vertebrae: There is low T1-weighted signal within the endplates at L5-S1 with moderate contrast-enhancement. There is edema within the L5-S1 disc space. The pattern is consistent with discitis-osteomyelitis. Conus medullaris and cauda equina: Conus extends to the T12-L1 level. Conus and cauda equina appear normal. Paraspinal and other soft tissues: There is mild contrast enhancement of the prevertebral soft tissues at the L5 and sacral levels. There is ventral epidural contrast enhancement at the L5 and S1 levels. Disc levels: No spinal canal or neural foraminal stenosis above the L4-5 level. L5-S1: Grade 1 anterolisthesis. Mild narrowing of the spinal canal due to the enhancing ventral epidural tissue. Abnormal enhancement extends into both neural foramina, which are moderately narrowed. IMPRESSION: 1. Discitis-osteomyelitis at L5-S1 with ventral epidural phlegmon/abscess at the L5 and S1 levels causing mild spinal canal stenosis and moderate bilateral foraminal narrowing. 2. Prevertebral soft tissue enhancement at the L5  and sacral levels, likely paravertebral phlegmon. No psoas abscess. 3. No cervical or thoracic discitis-osteomyelitis or epidural collection. 4. Long segment cervical anterior and posterior fusion without spinal canal stenosis. 5. Poorly characterized multifocal pulmonary opacities. Correlation with chest radiography or chest CT is recommended. Electronically Signed   By: Deatra RobinsonKevin  Herman M.D.   On: 06/16/2018 03:24    EKG: Orders placed or performed during the hospital encounter of 06/15/18  . EKG 12-Lead  . 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 Marcell Barlow[place]ar[place] by ED attending Noted history of C5 osteo/abscess, s/pC5/6 corpectomy, C4-7 fusion 11/14 c/b hardware migration/unstable cervical spine s/p posterior intervention 12/4 Admit to regular nursing for bed, empiric vancomycin/Rocephin with pharmacy to dose, neurosurgery/infectious disease consulted for expert opinion, IV fluids for rehydration, follow-up on cultures, check echocardiogram to evaluate for possible recurrent episode of endocarditis, contact precautions Note-Per records care everywhere patient is supposed to be on doxycycline twice daily lifelong given spinal instrumentation  *Acute recurrent L5-S1 spinal epidural abscess with phlegmon, mild spinal canal compression Empiric antibiotics per above, neurosurgery to see, n.p.o. 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 History of IV drug abuse, 10 years of heroin use, urine drug screen noted for benzodiazepine/cannabinoids/opioids Conservative medical management Case management to assist with rehab services/disposition planning  *History of pulmonary septic emboli  MRI of the spine noted for pulmonary abnormalities  Check cultures as stated above, antibiotics per above, check two-view chest x-ray for further evaluation Followed by [facility] cardiothoracic surgery  *History of bilateral knee septic arthritis secondary to MRSA Stable Conservative medical management   *Chronic anxiety disorder  anxiolytics as needed   *Malnutrition, acute on chronic moderate to severe Dietary consulted   All the records are reviewed and case discussed with ED provider. Management plans discussed with the patient, family and they are in agreement.  CODE STATUS:full Code Status History    Date Active Date Inactive Code Status Order ID Comments User Context   03/04/2018 0243 03/05/2018 0312 Full Code [id number]  Oralia ManisWillis, David, MD Inpatient   12/28/2017 1331 01/11/2018 2106 Full Code [id number]  Adrian SaranMody, Sital, MD Inpatient   09/20/2017 2027 09/24/2017 1255 Full Code [id number]  Rometta EmeryGarba, Mohammad L, MD ED   11/13/2015 0614 11/20/2015 1845 Full Code [id number]  Ihor AustinPyreddy, Pavan, MD ED   04/28/2013 0742 04/29/2013 1615 Full Code [id number]  Almond LintByerly, Faera, MD Inpatient       TOTAL TIME TAKING CARE OF THIS PATIENT: 45 minutes.    Evelena AsaMontell D Inice Sanluis M.D on 06/16/2018   Between 7am to 6pm - Pager - [phone]  After 6pm go to [email or web address] - password [email or web address]  [facility] [place] [facility]  Office  [phone]  CC: Primary care physician; Alberteen SpindleSciora, [name], CNM   Note: This dictation was prepared with Dragon  dictation along with smaller phrase technology. Any transcriptional errors that result from this process are unintentional.

## 2018-06-16 NOTE — Progress Notes (Signed)
   06/16/18 1100  Clinical Encounter Type  Visited With Patient;Health care provider  Visit Type Initial  Referral From Physician  Spiritual Encounters  Spiritual Needs Prayer;Emotional  Stress Factors  Patient Stress Factors Health changes   OR received for the patient's request for prayer. Upon arrival, patient lying on her back in the bed and covered by blanket. Patient states that her back is hurting and she is in need of pain medication. Patient also states that she is cold and would like to have something to drink. We talked briefly about what brought her to the hospital as well as the support she has from family in the area. The patient requested prayer for her health and being able to go home soon; prayer ensued. The patient expressed gratitude then indicated that she was sleepy/tired. Will follow up at another time. Chaplain retrieved two warm blankets for the patient, as indicated.

## 2018-06-16 NOTE — ED Notes (Signed)
Pt continues to rest in bed, NAD noted at this time. VSS and WNL. Pt asleep at this time. Will continue to monitor for further patient needs.

## 2018-06-16 NOTE — ED Notes (Signed)
Pt taken to X-ray, returned from X-ray, this RN notified by X-ray that patient refused. This RN paged Dr. Nemiah Commander and notified MD that patient was refusing to participate in care. Pt refused to uncover her head and and speak with this RN, grunting in response to questions asked by this RN. Will continue to monitor for further patient needs.

## 2018-06-16 NOTE — ED Notes (Signed)
This RN to bedside, pt placed in clean brief at this time by this RN. This RN called to bedside by Fara Olden, EDT due to patient removing pills from her bag, unsure which medications patient took out of her bag at this time or if patient took any medications.

## 2018-06-16 NOTE — Progress Notes (Signed)
Family Meeting Note  Advance Directive:yes  Today a meeting took place with the Patient.  Patient is able to participate   The following clinical team members were present during this meeting:MD  The following were discussed:Patient's diagnosis: Spinal abscess, Patient's progosis: Unable to determine and Goals for treatment: Full Code  Additional follow-up to be provided: prn  Time spent during discussion:20 minutes  Bertrum Sol, MD

## 2018-06-16 NOTE — ED Notes (Signed)
Upon entering room pt asleep on bedpan. Linens were changed and brief was placed on pt due to pt not using the bedpan. X-ray notified that pt is ready for exam.

## 2018-06-16 NOTE — ED Notes (Signed)
This RN, Aundra Millet, RN and Elmarie Shiley, RN at bedside explaining hospital policy to pt. Tiffany, RN explaining to pt that per hospital policy we must store pt's medication in pharmacy during their stay at the hospital. Pt becoming agitated stating "You're not taking my purse". Tiffany, RN explained to pt that we will not take purse from pt, we only need to store home medications in pharmacy. Pt agreeing to go through home medications with Tiffany, RN and letting medications be stored in pharmacy. Medications documented and walked to pharmacy by this RN.

## 2018-06-16 NOTE — ED Notes (Signed)
Pt was getting pill bottles out of purse and trying to look for pills to take. Pt got upset when we told her not to take any other meds while in the hospital. Reported to [facility].

## 2018-06-16 NOTE — Progress Notes (Signed)
[employer] - [place] at [facility]   PATIENT NAME: Andrea King    MR#:  [id number]  DATE OF BIRTH:  10/24/1975  SUBJECTIVE:  CHIEF COMPLAINT:   Chief Complaint  Patient presents with  . Back Pain   -Patient came in with fevers, history of IV drug abuse.  Complicated history. -Writhing in pain  REVIEW OF SYSTEMS:  Review of Systems  Constitutional: Negative for chills and fever.  Respiratory: Negative for cough, shortness of breath and wheezing.   Cardiovascular: Negative for chest pain and palpitations.  Gastrointestinal: Negative for abdominal pain, constipation, diarrhea, nausea and vomiting.  Genitourinary: Negative for dysuria.  Musculoskeletal: Positive for back pain, joint pain and myalgias.  Neurological: Negative for dizziness, seizures and headaches.    DRUG ALLERGIES:  No Known Allergies  VITALS:  Blood pressure 117/65, pulse (!) 105, temperature (!) 102.7 F (39.3 C), temperature source Oral, resp. rate 20, height 5\' 2"  (1.575 m), weight 51.3 kg, SpO2 99 %.  PHYSICAL EXAMINATION:  Physical Exam  GENERAL:  43 y.o.-year-old patient lying in the bed, very restless and requesting for pain medications EYES: Pupils equal, round, reactive to light and accommodation. No scleral icterus. Extraocular muscles intact.  HEENT: Head atraumatic, normocephalic. Oropharynx and nasopharynx clear.  NECK:  Supple, no jugular venous distention. No thyroid enlargement, no tenderness.  LUNGS: Normal breath sounds bilaterally, no wheezing, rales,rhonchi or crepitation. No use of accessory muscles of respiration.  Decreased bibasilar breath sounds CARDIOVASCULAR: S1, S2 normal. No  rubs, or gallops.  2/6 systolic murmur is present ABDOMEN: Soft, nontender, nondistended. Bowel sounds present. No organomegaly or mass.  EXTREMITIES: No pedal edema, cyanosis, or clubbing.  NEUROLOGIC: Cranial nerves II through XII are intact. Muscle strength 5/5 in all extremities.  Sensation intact. Gait not checked.  Overall weakness noted PSYCHIATRIC: The patient is alert and oriented x 3.  SKIN: No obvious rash, lesion, or ulcer.    LABORATORY PANEL:   CBC Recent Labs  Lab 06/15/18 2158  WBC 19.8*  HGB 10.3*  HCT 33.2*  PLT 351   ------------------------------------------------------------------------------------------------------------------  Chemistries  Recent Labs  Lab 06/15/18 2158  NA 133*  K 3.9  CL 102  CO2 24  GLUCOSE 112*  BUN 16  CREATININE 0.89  CALCIUM 9.1  AST 11*  ALT 8  ALKPHOS 62  BILITOT 1.0   ------------------------------------------------------------------------------------------------------------------  Cardiac Enzymes No results for input(s): TROPONINI in the last 168 hours. ------------------------------------------------------------------------------------------------------------------  RADIOLOGY:  Mr Cervical Spine W Or Wo Contrast  Result Date: 06/16/2018 CLINICAL DATA:  Fever. Severe neck and low back pain. Recent cervical fusion for discitis-osteomyelitis. EXAM: MRI CERVICAL, THORACIC AND LUMBAR SPINE WITH AND WITHOUT CONTRAST TECHNIQUE: Multiplanar and multiecho pulse sequences of the cervical spine, to include the craniocervical junction and cervicothoracic junction, and thoracic and lumbar spine, were obtained with without intravenous contrast. COMPARISON:  Cervical spine MRI 03/03/2018 CONTRAST:  5 mL Gadavist FINDINGS: MRI CERVICAL SPINE FINDINGS Alignment: Physiologic. Vertebrae: Status post long segment anterior and posterior spinal fusion with anterior hardware from C3-C7 and posterior hardware from C2-T2. No evidence of active discitis-osteomyelitis. Cord: Spinal cord signal is normal.  No epidural collection. Posterior Fossa, vertebral arteries, paraspinal tissues: Opacities in the lung apices are poorly characterized by MRI. Disc levels: There is no spinal canal stenosis or neural foraminal stenosis. MRI  THORACIC SPINE FINDINGS Alignment:  Physiologic. Vertebrae: No fracture, evidence of discitis, or bone lesion. Cord:  Normal signal and morphology. Paraspinal and other soft tissues: Poorly  visualized multifocal pulmonary opacities. Disc levels: No spinal canal or neural foraminal stenosis. MRI LUMBAR SPINE FINDINGS Segmentation:  Standard. Alignment:  Grade 1 anterolisthesis at L5-S1. Vertebrae: There is low T1-weighted signal within the endplates at L5-S1 with moderate contrast-enhancement. There is edema within the L5-S1 disc space. The pattern is consistent with discitis-osteomyelitis. Conus medullaris and cauda equina: Conus extends to the T12-L1 level. Conus and cauda equina appear normal. Paraspinal and other soft tissues: There is mild contrast enhancement of the prevertebral soft tissues at the L5 and sacral levels. There is ventral epidural contrast enhancement at the L5 and S1 levels. Disc levels: No spinal canal or neural foraminal stenosis above the L4-5 level. L5-S1: Grade 1 anterolisthesis. Mild narrowing of the spinal canal due to the enhancing ventral epidural tissue. Abnormal enhancement extends into both neural foramina, which are moderately narrowed. IMPRESSION: 1. Discitis-osteomyelitis at L5-S1 with ventral epidural phlegmon/abscess at the L5 and S1 levels causing mild spinal canal stenosis and moderate bilateral foraminal narrowing. 2. Prevertebral soft tissue enhancement at the L5 and sacral levels, likely paravertebral phlegmon. No psoas abscess. 3. No cervical or thoracic discitis-osteomyelitis or epidural collection. 4. Long segment cervical anterior and posterior fusion without spinal canal stenosis. 5. Poorly characterized multifocal pulmonary opacities. Correlation with chest radiography or chest CT is recommended. Electronically Signed   By: Deatra RobinsonKevin  Herman M.D.   On: 06/16/2018 03:24   Mr Thoracic Spine W Wo Contrast  Result Date: 06/16/2018 CLINICAL DATA:  Fever. Severe neck and  low back pain. Recent cervical fusion for discitis-osteomyelitis. EXAM: MRI CERVICAL, THORACIC AND LUMBAR SPINE WITH AND WITHOUT CONTRAST TECHNIQUE: Multiplanar and multiecho pulse sequences of the cervical spine, to include the craniocervical junction and cervicothoracic junction, and thoracic and lumbar spine, were obtained with without intravenous contrast. COMPARISON:  Cervical spine MRI 03/03/2018 CONTRAST:  5 mL Gadavist FINDINGS: MRI CERVICAL SPINE FINDINGS Alignment: Physiologic. Vertebrae: Status post long segment anterior and posterior spinal fusion with anterior hardware from C3-C7 and posterior hardware from C2-T2. No evidence of active discitis-osteomyelitis. Cord: Spinal cord signal is normal.  No epidural collection. Posterior Fossa, vertebral arteries, paraspinal tissues: Opacities in the lung apices are poorly characterized by MRI. Disc levels: There is no spinal canal stenosis or neural foraminal stenosis. MRI THORACIC SPINE FINDINGS Alignment:  Physiologic. Vertebrae: No fracture, evidence of discitis, or bone lesion. Cord:  Normal signal and morphology. Paraspinal and other soft tissues: Poorly visualized multifocal pulmonary opacities. Disc levels: No spinal canal or neural foraminal stenosis. MRI LUMBAR SPINE FINDINGS Segmentation:  Standard. Alignment:  Grade 1 anterolisthesis at L5-S1. Vertebrae: There is low T1-weighted signal within the endplates at L5-S1 with moderate contrast-enhancement. There is edema within the L5-S1 disc space. The pattern is consistent with discitis-osteomyelitis. Conus medullaris and cauda equina: Conus extends to the T12-L1 level. Conus and cauda equina appear normal. Paraspinal and other soft tissues: There is mild contrast enhancement of the prevertebral soft tissues at the L5 and sacral levels. There is ventral epidural contrast enhancement at the L5 and S1 levels. Disc levels: No spinal canal or neural foraminal stenosis above the L4-5 level. L5-S1: Grade 1  anterolisthesis. Mild narrowing of the spinal canal due to the enhancing ventral epidural tissue. Abnormal enhancement extends into both neural foramina, which are moderately narrowed. IMPRESSION: 1. Discitis-osteomyelitis at L5-S1 with ventral epidural phlegmon/abscess at the L5 and S1 levels causing mild spinal canal stenosis and moderate bilateral foraminal narrowing. 2. Prevertebral soft tissue enhancement at the L5 and sacral levels, likely paravertebral  phlegmon. No psoas abscess. 3. No cervical or thoracic discitis-osteomyelitis or epidural collection. 4. Long segment cervical anterior and posterior fusion without spinal canal stenosis. 5. Poorly characterized multifocal pulmonary opacities. Correlation with chest radiography or chest CT is recommended. Electronically Signed   By: Deatra Robinson M.D.   On: 06/16/2018 03:24   Mr Lumbar Spine W Wo Contrast  Result Date: 06/16/2018 CLINICAL DATA:  Fever. Severe neck and low back pain. Recent cervical fusion for discitis-osteomyelitis. EXAM: MRI CERVICAL, THORACIC AND LUMBAR SPINE WITH AND WITHOUT CONTRAST TECHNIQUE: Multiplanar and multiecho pulse sequences of the cervical spine, to include the craniocervical junction and cervicothoracic junction, and thoracic and lumbar spine, were obtained with without intravenous contrast. COMPARISON:  Cervical spine MRI 03/03/2018 CONTRAST:  5 mL Gadavist FINDINGS: MRI CERVICAL SPINE FINDINGS Alignment: Physiologic. Vertebrae: Status post long segment anterior and posterior spinal fusion with anterior hardware from C3-C7 and posterior hardware from C2-T2. No evidence of active discitis-osteomyelitis. Cord: Spinal cord signal is normal.  No epidural collection. Posterior Fossa, vertebral arteries, paraspinal tissues: Opacities in the lung apices are poorly characterized by MRI. Disc levels: There is no spinal canal stenosis or neural foraminal stenosis. MRI THORACIC SPINE FINDINGS Alignment:  Physiologic. Vertebrae: No  fracture, evidence of discitis, or bone lesion. Cord:  Normal signal and morphology. Paraspinal and other soft tissues: Poorly visualized multifocal pulmonary opacities. Disc levels: No spinal canal or neural foraminal stenosis. MRI LUMBAR SPINE FINDINGS Segmentation:  Standard. Alignment:  Grade 1 anterolisthesis at L5-S1. Vertebrae: There is low T1-weighted signal within the endplates at L5-S1 with moderate contrast-enhancement. There is edema within the L5-S1 disc space. The pattern is consistent with discitis-osteomyelitis. Conus medullaris and cauda equina: Conus extends to the T12-L1 level. Conus and cauda equina appear normal. Paraspinal and other soft tissues: There is mild contrast enhancement of the prevertebral soft tissues at the L5 and sacral levels. There is ventral epidural contrast enhancement at the L5 and S1 levels. Disc levels: No spinal canal or neural foraminal stenosis above the L4-5 level. L5-S1: Grade 1 anterolisthesis. Mild narrowing of the spinal canal due to the enhancing ventral epidural tissue. Abnormal enhancement extends into both neural foramina, which are moderately narrowed. IMPRESSION: 1. Discitis-osteomyelitis at L5-S1 with ventral epidural phlegmon/abscess at the L5 and S1 levels causing mild spinal canal stenosis and moderate bilateral foraminal narrowing. 2. Prevertebral soft tissue enhancement at the L5 and sacral levels, likely paravertebral phlegmon. No psoas abscess. 3. No cervical or thoracic discitis-osteomyelitis or epidural collection. 4. Long segment cervical anterior and posterior fusion without spinal canal stenosis. 5. Poorly characterized multifocal pulmonary opacities. Correlation with chest radiography or chest CT is recommended. Electronically Signed   By: Deatra Robinson M.D.   On: 06/16/2018 03:24    EKG:   Orders placed or performed during the hospital encounter of 06/15/18  . EKG 12-Lead  . 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  1.  Lumbar L5-S1 osteomyelitis/discitis-MRI from December also showed osteomyelitis and phlegmon at that region.  But since she had multiple procedures done at the time, she was discharged with outpatient follow-up - she had MRSA, serratia and enterococcus bacteremia at that time -Patient was supposed to be taking oral doxycycline which she states she has  not been taking. -Fevers and chills for the last couple of days.  Continues to use IV drugs, last use yesterday. -Discussed with neurosurgery-they have clearly mentioned that they will not be doing any surgical procedures at this time. -No psoas abscess noted. -Awaiting ID input -On broad-spectrum antibiotics with vancomycin and Zosyn.  Blood cultures are pending  2.  Pain control-pain control has been an issue due to her high threshold for pain meds -Started on methadone.  She was discharged on Suboxone and no opioids while in December.  However patient continues to use IV opioids. -Continue to monitor -Also added gabapentin  3.  Anxiety and withdrawal from benzos-started on Klonopin at this time  4.  Pulmonary opacities-patient did have septic pulmonary emboli in December.  Status post tricuspid valve surgery and repeat bacteremia.  However was not a candidate for repeat surgery -Chest x-ray ordered, however patient has been refusing x-rays scans at this time  5.  Refused palliative care consult.  Overall poor prognosis due to continued use of IV drugs   All the records are reviewed and case discussed with Care Management/Social Workerr. Management plans discussed with the patient, family and they are in agreement.  CODE STATUS: Full code  TOTAL  TIME TAKING CARE OF THIS PATIENT: 38 minutes.   POSSIBLE D/C IN ? DAYS, DEPENDING ON CLINICAL CONDITION.   Enid Baasadhika Serrina Minogue M.D on 06/16/2018 at 2:46 PM  Between 7am to 6pm - Pager - [phone]  After 6pm go to [email or web address] - Social research officer, governmentpassword EPAS [facility]  [facility]  Office  [phone]  CC: Primary care physician; Alberteen SpindleSciora, [name], CNM

## 2018-06-16 NOTE — ED Notes (Signed)
Pt remains in MRI. RN unable to assess pain at this time.

## 2018-06-16 NOTE — ED Notes (Signed)
This RN and Butch, RN to bedside to do bedside handoff at this time. Pt resting in bed, arouses somewhat with verbal stimuli, grunts in response to this RN introducing self. Zosyn running per medication order. Will continue to monitor for admitted bed.

## 2018-06-16 NOTE — Consult Note (Signed)
Referring Physician:  No referring provider defined for this encounter.  Primary Physician:  Alberteen SpindleSciora, [name], CNM  Chief Complaint: Neck pain  History of Present Illness: 06/16/2018 Andrea King is a 43 yo female who presents with known history of IVDA, discitis/osteomyelitis (serratia), and prior anterior/posterior spine surgery by my [facility].  She had a significant period of time in the hospital after this surgery due to need for IV antibiotics. She reports that she is off antibiotics for a short period of time, but she does not appear to be a reliable historian.  She now presents with fever, low back pain, and neck pain after this known history of discitis/osteomyelitis.  She reports that she last used IV drugs ~2 weeks ago.  She is staying with a friend at this time.  She denies any new neurologic symptoms including weakness, numbness, or bowel or bladder symptoms.  [facility]: Patient was poor historian due to inability to focus because of pain/medication. History obtained through previous provider notes.  Andrea King is a 43 y.o. female with a history of IVDA who presents with the chief complaint of neck pain.  [facility] for cervical discitis with epidural abscess. She was started on IV antibiotics with the intention of treatment for 6 weeks.  However, after a few weeks she left the hospital AMA without completing full treatment.    Presented to the emergency room yesterday with complaints of neck pain, back pain, right arm numbness, and left arm numbness.  ED provider had spoken to Dr. Marcell BarlowYarborough and no weakness was noted on physical exam, but imaging revealed progressive osteomyelitis discitis as well as 4 mm retrolisthesis of C5 on C6 resulting in fairly severe spinal stenosis.  Patient was admitted for treatment.  On evaluation, patient complains of severe, midline, low posterior  cervical pain; right arm numbness; left calf numbness; bilateral knee pain. Unable to determine how long symptoms have been present.  Right arm numbness described posterior aspect of upper extremity with involvement of first 2 digits. Left leg numbness circumferentially through calf.    Bilateral knee pain may have been caused by a fall but she is unable to elaborate.  Complains that "muscles don't work".   Review of Systems:  A 10 point review of systems is negative, except for the pertinent positives and negatives detailed in the HPI.  Past Medical History: Past Medical History:  Diagnosis Date  . Anemia   . Asthma   . DJD (degenerative joint disease)   . Hepatitis C   . Substance abuse [facility](HCC)     Past Surgical History: Past Surgical History:  Procedure Laterality Date  . INGUINAL HERNIA REPAIR Left 04/28/2013   Procedure: HERNIA REPAIR INGUINAL ADULT with mesh;  Surgeon: Cherylynn RidgesJames O Wyatt, MD;  [facility];  Service: General;  Laterality: Left;  . TEE WITHOUT CARDIOVERSION N/A 11/18/2015   Procedure: TRANSESOPHAGEAL ECHOCARDIOGRAM (TEE);  Surgeon: Iran OuchMuhammad A Arida, MD;  Location: [facility] ORS;  Service: Cardiovascular;  Laterality: N/A;  . TEE WITHOUT CARDIOVERSION N/A 12/31/2017   Procedure: TRANSESOPHAGEAL ECHOCARDIOGRAM (TEE);  Surgeon: Dalia HeadingFath, Kenneth A, MD;  Location: [facility] ORS;  Service: Cardiovascular;  Laterality: N/A;  . TEE WITHOUT CARDIOVERSION N/A 01/04/2018   Procedure: TRANSESOPHAGEAL ECHOCARDIOGRAM (TEE);  Surgeon: Dalia HeadingFath, Kenneth A, MD;  Location: [facility] ORS;  Service: Cardiovascular;  Laterality: N/A;  . TUMOR EXCISION     left scalpula    Allergies: Allergies  as of 06/15/2018  . (No Known Allergies)    Medications:  Current Facility-Administered Medications:  .  HYDROcodone-acetaminophen (NORCO/VICODIN) 5-325 MG per tablet 1-2 tablet, 1-2 tablet, Oral, Q4H PRN, Salary, Montell D, MD, 2 tablet at 06/16/18 0600 .  piperacillin-tazobactam (ZOSYN) IVPB 3.375 g, 3.375 g,  Intravenous, Q8H, Salary, Montell D, MD, Last Rate: 12.5 mL/hr at 06/16/18 0636, 3.375 g at 06/16/18 0636 .  vancomycin (VANCOCIN) 500 mg in sodium chloride 0.9 % 100 mL IVPB, 500 mg, Intravenous, Q12H, Salary, Montell D, MD No current outpatient medications on file.   Social History: Social History   Tobacco Use  . Smoking status: Current Every Day Smoker    Types: Cigarettes  . Smokeless tobacco: Never Used  Substance Use Topics  . Alcohol use: No    Alcohol/week: 0.0 standard drinks  . Drug use: Yes    Comment: cocaine,Heroine    Family Medical History: Family History  Problem Relation Age of Onset  . Cancer Mother   . Cancer - Colon Maternal Grandfather   . Diabetes Maternal Grandfather     Physical Examination: Vitals:   06/16/18 0550 06/16/18 0638  BP: 134/69 (!) 124/56  Pulse: (!) 127 (!) 120  Resp: 18 17  Temp:    SpO2: 96% 95%     General: Patient is agitated, sleepy, and unfocused. Requires stimulation to follow exam. She asks for pain medications multiple times. She is quite sweaty. Psychiatric: Patient is non-anxious. Neck:   Supple.  Full range of motion. Respiratory: Patient is breathing without any difficulty. Extremities: No edema. Vascular: Palpable pulses in dorsal pedal vessels. Skin:   On exposed skin, there are no abnormal skin lesions noted. Incisions c/d/i x 2.  NEUROLOGICAL:  General: moderate distress, speech slurred. Responds appropriately to voice.    Awake, alert, oriented to person, place, and time.  Facial tone is symmetric. ROM of spine: Pain elicited on movement.  Palpation of spine: tenderness to palpation midline low posterior cervical spine.    Strength: Side Biceps Triceps Deltoid Interossei Grip Wrist Ext. Wrist Flex.  R 4+ 4+ 4+ 4+ 5 5 5   L 4+ 4+ 4+ 4+ 5 5 5    Side Iliopsoas Quads Hamstring PF DF EHL  R 4+ 5 5 5 5 5   L 4+ 5 5 5 5 5   *potentially effort limited weakness.   Reflexes are 2+ and symmetric at the biceps,  triceps, brachioradialis, patella and achilles.  Sensation intact.  Clonus is not present.  Toes are down-going.  Gait not assessed.  Hoffman's is absent.  She gives intermittent effort with exam, likely explaining the 4+ findings.  This is consistent with my prior examinations of Andrea King.  Imaging: MRI CTL spine w and without 06/16/2018 IMPRESSION: 1. Discitis-osteomyelitis at L5-S1 with ventral epidural phlegmon/abscess at the L5 and S1 levels causing mild spinal canal stenosis and moderate bilateral foraminal narrowing. 2. Prevertebral soft tissue enhancement at the L5 and sacral levels, likely paravertebral phlegmon. No psoas abscess. 3. No cervical or thoracic discitis-osteomyelitis or epidural collection. 4. Long segment cervical anterior and posterior fusion without spinal canal stenosis. 5. Poorly characterized multifocal pulmonary opacities. Correlation with chest radiography or chest CT is recommended.   Electronically Signed   By: Deatra Robinson M.D.   On: 06/16/2018 03:24  Assessment and Plan: Ms. Mitten is a pleasant 43 y.o. female with known osteomyelitis discitis in cervical and lumbar spine.    She has no new neurological symptoms, and no overt moderate or  severe stenosis in the cervical, thoracic, or lumbar spine.    I would not recommend surgical intervention.  Agree with admission to medicine for medical therapy, workup for fevers, ID consult, possible L5-S1 biopsy by IR (if recommended by ID), and further care.    No need to remain NPO from my standpoint.  Venetia Nighthester Anyah Swallow MD

## 2018-06-17 DIAGNOSIS — R509 Fever, unspecified: Secondary | ICD-10-CM

## 2018-06-17 DIAGNOSIS — Z8619 Personal history of other infectious and parasitic diseases: Secondary | ICD-10-CM

## 2018-06-17 LAB — CBC
HCT: 27.1 % — ABNORMAL LOW (ref 36.0–46.0)
Hemoglobin: 8.4 g/dL — ABNORMAL LOW (ref 12.0–15.0)
MCH: 26.1 pg (ref 26.0–34.0)
MCHC: 31 g/dL (ref 30.0–36.0)
MCV: 84.2 fL (ref 80.0–100.0)
Platelets: 284 10*3/uL (ref 150–400)
RBC: 3.22 MIL/uL — ABNORMAL LOW (ref 3.87–5.11)
RDW: 17.1 % — ABNORMAL HIGH (ref 11.5–15.5)
WBC: 13.3 10*3/uL — ABNORMAL HIGH (ref 4.0–10.5)
nRBC: 0 % (ref 0.0–0.2)

## 2018-06-17 LAB — BASIC METABOLIC PANEL
Anion gap: 5 (ref 5–15)
BUN: 16 mg/dL (ref 6–20)
CHLORIDE: 106 mmol/L (ref 98–111)
CO2: 25 mmol/L (ref 22–32)
CREATININE: 0.85 mg/dL (ref 0.44–1.00)
Calcium: 8.5 mg/dL — ABNORMAL LOW (ref 8.9–10.3)
GFR calc Af Amer: >60 mL/min (ref >60)
GFR calc non Af Amer: >60 mL/min (ref >60)
Glucose, Bld: 111 mg/dL — ABNORMAL HIGH (ref 70–99)
Potassium: 3.6 mmol/L (ref 3.5–5.1)
Sodium: 136 mmol/L (ref 135–145)

## 2018-06-17 LAB — ECHOCARDIOGRAM COMPLETE
Height: 62 in
Weight: 1808 oz

## 2018-06-17 LAB — URINE DRUG SCREEN, QUALITATIVE (ARMC ONLY)
Amphetamines, Ur Screen: NOT DETECTED
Barbiturates, Ur Screen: NOT DETECTED
Benzodiazepine, Ur Scrn: POSITIVE — AB
COCAINE METABOLITE, UR ~~LOC~~: NOT DETECTED
Cannabinoid 50 Ng, Ur ~~LOC~~: POSITIVE — AB
MDMA (Ecstasy)Ur Screen: NOT DETECTED
METHADONE SCREEN, URINE: POSITIVE — AB
Opiate, Ur Screen: POSITIVE — AB
Phencyclidine (PCP) Ur S: NOT DETECTED
Tricyclic, Ur Screen: NOT DETECTED

## 2018-06-17 MED ORDER — ALBUTEROL SULFATE (2.5 MG/3ML) 0.083% IN NEBU: 2.5000 mg | INHALATION_SOLUTION | Freq: Four times a day (QID) | RESPIRATORY_TRACT | Status: DC | PRN

## 2018-06-17 MED ORDER — SODIUM CHLORIDE 0.9 % IV SOLN
2.0000 g | Freq: Three times a day (TID) | INTRAVENOUS | Status: DC
  Administered 2018-06-17 – 2018-06-18 (×3): 2 g via INTRAVENOUS
  Filled 2018-06-17 (×5): qty 2

## 2018-06-17 NOTE — Progress Notes (Signed)
Primary nurse noted that pt was recording staff on cell phone ; Primary nurse informed pt that it was against our policy for pt to record staff members. Pt repeatedly kept stating " so you are not going to call my Dr." Primary nurse informed pt that the day nurse had spoken to the MD prior today and that they are going to keep the pt on oral pain medication  along with IV antibiotics. Primary nurse notified charge nurse along with Nursing Supervisor.

## 2018-06-17 NOTE — Progress Notes (Signed)
[facility] [place] [facility]   PATIENT NAME: Andrea MannsRaina King    MR#:  [id number]  DATE OF BIRTH:  1975-09-09  SUBJECTIVE:  CHIEF COMPLAINT:   Chief Complaint  Patient presents with  . Back Pain   -No fevers today.  Complains of significant low back pain. -Has a telemetry sitter as patient was found to have a syringe in her hand and lethargic last evening. -No IR guided procedure today  REVIEW OF SYSTEMS:  Review of Systems  Constitutional: Negative for chills and fever.  Respiratory: Negative for cough, shortness of breath and wheezing.   Cardiovascular: Negative for chest pain and palpitations.  Gastrointestinal: Negative for abdominal pain, constipation, diarrhea, nausea and vomiting.  Genitourinary: Negative for dysuria.  Musculoskeletal: Positive for back pain, joint pain and myalgias.  Neurological: Negative for dizziness, seizures and headaches.    DRUG ALLERGIES:  No Known Allergies  VITALS:  Blood pressure (!) 141/75, pulse 76, temperature 99.3 F (37.4 C), temperature source Oral, resp. rate 20, height 5\' 2"  (1.575 m), weight 51.3 kg, SpO2 97 %.  PHYSICAL EXAMINATION:  Physical Exam  GENERAL:  43 y.o.-year-old patient lying in the bed, very restless and requesting for pain medications EYES: Pupils equal, round, reactive to light and accommodation. No scleral icterus. Extraocular muscles intact.  HEENT: Head atraumatic, normocephalic. Oropharynx and nasopharynx clear.  NECK:  Supple, no jugular venous distention. No thyroid enlargement, no tenderness.  LUNGS: Normal breath sounds bilaterally, no wheezing, rales,rhonchi or crepitation. No use of accessory muscles of respiration.  Decreased bibasilar breath sounds CARDIOVASCULAR: S1, S2 normal. No  rubs, or gallops.  2/6 systolic murmur is present ABDOMEN: Soft, nontender, nondistended. Bowel sounds present. No organomegaly or mass.  EXTREMITIES: No pedal edema, cyanosis, or clubbing.    NEUROLOGIC: Cranial nerves II through XII are intact. Muscle strength 5/5 in all extremities. Sensation intact. Gait not checked.  Overall weakness noted PSYCHIATRIC: The patient is sleepy, but easily arousable and oriented SKIN: No obvious rash, lesion, or ulcer.    LABORATORY PANEL:   CBC Recent Labs  Lab 06/17/18 0705  WBC 13.3*  HGB 8.4*  HCT 27.1*  PLT 284   ------------------------------------------------------------------------------------------------------------------  Chemistries  Recent Labs  Lab 06/15/18 2158 06/17/18 0705  NA 133* 136  K 3.9 3.6  CL 102 106  CO2 24 25  GLUCOSE 112* 111*  BUN 16 16  CREATININE 0.89 0.85  CALCIUM 9.1 8.5*  AST 11*  --   ALT 8  --   ALKPHOS 62  --   BILITOT 1.0  --    ------------------------------------------------------------------------------------------------------------------  Cardiac Enzymes No results for input(s): TROPONINI in the last 168 hours. ------------------------------------------------------------------------------------------------------------------  RADIOLOGY:  Mr Cervical Spine W Or Wo Contrast  Result Date: 06/16/2018 CLINICAL DATA:  Fever. Severe neck and low back pain. Recent cervical fusion for discitis-osteomyelitis. EXAM: MRI CERVICAL, THORACIC AND LUMBAR SPINE WITH AND WITHOUT CONTRAST TECHNIQUE: Multiplanar and multiecho pulse sequences of the cervical spine, to include the craniocervical junction and cervicothoracic junction, and thoracic and lumbar spine, were obtained with without intravenous contrast. COMPARISON:  Cervical spine MRI 03/03/2018 CONTRAST:  5 mL Gadavist FINDINGS: MRI CERVICAL SPINE FINDINGS Alignment: Physiologic. Vertebrae: Status post long segment anterior and posterior spinal fusion with anterior hardware from C3-C7 and posterior hardware from C2-T2. No evidence of active discitis-osteomyelitis. Cord: Spinal cord signal is normal.  No epidural collection. Posterior Fossa,  vertebral arteries, paraspinal tissues: Opacities in the lung apices are poorly characterized by MRI.  Disc levels: There is no spinal canal stenosis or neural foraminal stenosis. MRI THORACIC SPINE FINDINGS Alignment:  Physiologic. Vertebrae: No fracture, evidence of discitis, or bone lesion. Cord:  Normal signal and morphology. Paraspinal and other soft tissues: Poorly visualized multifocal pulmonary opacities. Disc levels: No spinal canal or neural foraminal stenosis. MRI LUMBAR SPINE FINDINGS Segmentation:  Standard. Alignment:  Grade 1 anterolisthesis at L5-S1. Vertebrae: There is low T1-weighted signal within the endplates at L5-S1 with moderate contrast-enhancement. There is edema within the L5-S1 disc space. The pattern is consistent with discitis-osteomyelitis. Conus medullaris and cauda equina: Conus extends to the T12-L1 level. Conus and cauda equina appear normal. Paraspinal and other soft tissues: There is mild contrast enhancement of the prevertebral soft tissues at the L5 and sacral levels. There is ventral epidural contrast enhancement at the L5 and S1 levels. Disc levels: No spinal canal or neural foraminal stenosis above the L4-5 level. L5-S1: Grade 1 anterolisthesis. Mild narrowing of the spinal canal due to the enhancing ventral epidural tissue. Abnormal enhancement extends into both neural foramina, which are moderately narrowed. IMPRESSION: 1. Discitis-osteomyelitis at L5-S1 with ventral epidural phlegmon/abscess at the L5 and S1 levels causing mild spinal canal stenosis and moderate bilateral foraminal narrowing. 2. Prevertebral soft tissue enhancement at the L5 and sacral levels, likely paravertebral phlegmon. No psoas abscess. 3. No cervical or thoracic discitis-osteomyelitis or epidural collection. 4. Long segment cervical anterior and posterior fusion without spinal canal stenosis. 5. Poorly characterized multifocal pulmonary opacities. Correlation with chest radiography or chest CT is  recommended. Electronically Signed   By: Deatra Robinson M.D.   On: 06/16/2018 03:24   Mr Thoracic Spine W Wo Contrast  Result Date: 06/16/2018 CLINICAL DATA:  Fever. Severe neck and low back pain. Recent cervical fusion for discitis-osteomyelitis. EXAM: MRI CERVICAL, THORACIC AND LUMBAR SPINE WITH AND WITHOUT CONTRAST TECHNIQUE: Multiplanar and multiecho pulse sequences of the cervical spine, to include the craniocervical junction and cervicothoracic junction, and thoracic and lumbar spine, were obtained with without intravenous contrast. COMPARISON:  Cervical spine MRI 03/03/2018 CONTRAST:  5 mL Gadavist FINDINGS: MRI CERVICAL SPINE FINDINGS Alignment: Physiologic. Vertebrae: Status post long segment anterior and posterior spinal fusion with anterior hardware from C3-C7 and posterior hardware from C2-T2. No evidence of active discitis-osteomyelitis. Cord: Spinal cord signal is normal.  No epidural collection. Posterior Fossa, vertebral arteries, paraspinal tissues: Opacities in the lung apices are poorly characterized by MRI. Disc levels: There is no spinal canal stenosis or neural foraminal stenosis. MRI THORACIC SPINE FINDINGS Alignment:  Physiologic. Vertebrae: No fracture, evidence of discitis, or bone lesion. Cord:  Normal signal and morphology. Paraspinal and other soft tissues: Poorly visualized multifocal pulmonary opacities. Disc levels: No spinal canal or neural foraminal stenosis. MRI LUMBAR SPINE FINDINGS Segmentation:  Standard. Alignment:  Grade 1 anterolisthesis at L5-S1. Vertebrae: There is low T1-weighted signal within the endplates at L5-S1 with moderate contrast-enhancement. There is edema within the L5-S1 disc space. The pattern is consistent with discitis-osteomyelitis. Conus medullaris and cauda equina: Conus extends to the T12-L1 level. Conus and cauda equina appear normal. Paraspinal and other soft tissues: There is mild contrast enhancement of the prevertebral soft tissues at the L5 and  sacral levels. There is ventral epidural contrast enhancement at the L5 and S1 levels. Disc levels: No spinal canal or neural foraminal stenosis above the L4-5 level. L5-S1: Grade 1 anterolisthesis. Mild narrowing of the spinal canal due to the enhancing ventral epidural tissue. Abnormal enhancement extends into both neural foramina, which are  moderately narrowed. IMPRESSION: 1. Discitis-osteomyelitis at L5-S1 with ventral epidural phlegmon/abscess at the L5 and S1 levels causing mild spinal canal stenosis and moderate bilateral foraminal narrowing. 2. Prevertebral soft tissue enhancement at the L5 and sacral levels, likely paravertebral phlegmon. No psoas abscess. 3. No cervical or thoracic discitis-osteomyelitis or epidural collection. 4. Long segment cervical anterior and posterior fusion without spinal canal stenosis. 5. Poorly characterized multifocal pulmonary opacities. Correlation with chest radiography or chest CT is recommended. Electronically Signed   By: Deatra Robinson M.D.   On: 06/16/2018 03:24   Mr Lumbar Spine W Wo Contrast  Result Date: 06/16/2018 CLINICAL DATA:  Fever. Severe neck and low back pain. Recent cervical fusion for discitis-osteomyelitis. EXAM: MRI CERVICAL, THORACIC AND LUMBAR SPINE WITH AND WITHOUT CONTRAST TECHNIQUE: Multiplanar and multiecho pulse sequences of the cervical spine, to include the craniocervical junction and cervicothoracic junction, and thoracic and lumbar spine, were obtained with without intravenous contrast. COMPARISON:  Cervical spine MRI 03/03/2018 CONTRAST:  5 mL Gadavist FINDINGS: MRI CERVICAL SPINE FINDINGS Alignment: Physiologic. Vertebrae: Status post long segment anterior and posterior spinal fusion with anterior hardware from C3-C7 and posterior hardware from C2-T2. No evidence of active discitis-osteomyelitis. Cord: Spinal cord signal is normal.  No epidural collection. Posterior Fossa, vertebral arteries, paraspinal tissues: Opacities in the lung  apices are poorly characterized by MRI. Disc levels: There is no spinal canal stenosis or neural foraminal stenosis. MRI THORACIC SPINE FINDINGS Alignment:  Physiologic. Vertebrae: No fracture, evidence of discitis, or bone lesion. Cord:  Normal signal and morphology. Paraspinal and other soft tissues: Poorly visualized multifocal pulmonary opacities. Disc levels: No spinal canal or neural foraminal stenosis. MRI LUMBAR SPINE FINDINGS Segmentation:  Standard. Alignment:  Grade 1 anterolisthesis at L5-S1. Vertebrae: There is low T1-weighted signal within the endplates at L5-S1 with moderate contrast-enhancement. There is edema within the L5-S1 disc space. The pattern is consistent with discitis-osteomyelitis. Conus medullaris and cauda equina: Conus extends to the T12-L1 level. Conus and cauda equina appear normal. Paraspinal and other soft tissues: There is mild contrast enhancement of the prevertebral soft tissues at the L5 and sacral levels. There is ventral epidural contrast enhancement at the L5 and S1 levels. Disc levels: No spinal canal or neural foraminal stenosis above the L4-5 level. L5-S1: Grade 1 anterolisthesis. Mild narrowing of the spinal canal due to the enhancing ventral epidural tissue. Abnormal enhancement extends into both neural foramina, which are moderately narrowed. IMPRESSION: 1. Discitis-osteomyelitis at L5-S1 with ventral epidural phlegmon/abscess at the L5 and S1 levels causing mild spinal canal stenosis and moderate bilateral foraminal narrowing. 2. Prevertebral soft tissue enhancement at the L5 and sacral levels, likely paravertebral phlegmon. No psoas abscess. 3. No cervical or thoracic discitis-osteomyelitis or epidural collection. 4. Long segment cervical anterior and posterior fusion without spinal canal stenosis. 5. Poorly characterized multifocal pulmonary opacities. Correlation with chest radiography or chest CT is recommended. Electronically Signed   By: Deatra Robinson M.D.   On:  06/16/2018 03:24   Dg Chest Port 1 View  Result Date: 06/16/2018 CLINICAL DATA:  Fever.  History of substance abuse and hepatitis C. EXAM: PORTABLE CHEST 1 VIEW COMPARISON:  03/03/2018 FINDINGS: There changes from prior cardiac surgery and valve replacement. Cardiac silhouette is normal in size. No mediastinal or hilar masses. No evidence of adenopathy. Lungs are clear.  No pleural effusion or pneumothorax. There has been a long posterior and anterior cervical spine fusion, new since the prior radiographs. No acute skeletal abnormality. IMPRESSION: No acute cardiopulmonary disease.  Electronically Signed   By: Amie Portland M.D.   On: 06/16/2018 17:27    EKG:   Orders placed or performed during the hospital encounter of 06/15/18  . EKG 12-Lead  . 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  1.  Lumbar L5-S1 osteomyelitis/discitis-MRI from December 2019 also showed osteomyelitis and phlegmon at that region.  But since she had multiple procedures done at the time, she was discharged with outpatient follow-up was supposed to be on doxycycline prophylaxis which she did not take for the last 2 weeks according to patient. - she had MRSA, serratia and enterococcus bacteremia at that time -Admitted with fevers and chills, cultures are pending -Appreciate ID consult.  Requested IR guided biopsy for cultures, however not able to be done until Tuesday of next week. -Discussed with neurosurgery-they have clearly mentioned that they will not be doing any surgical procedures at this time. -No psoas abscess noted. -On broad-spectrum antibiotics with vancomycin and Zosyn.     2.  Pain control-pain control has  been an issue due to her high threshold for pain meds -Started on methadone.  She was discharged on Suboxone and no opioids while in December.  However patient continues to use IV opioids as outpatient. -Continue to monitor -Also added gabapentin  3.  Anxiety and withdrawal from benzos-started on Klonopin at this time  4.  Pulmonary opacities-patient did have septic pulmonary emboli in December.  Status post tricuspid valve surgery and repeat bacteremia.  However was not a candidate for repeat surgery -Chest x-ray ordered, however patient has been refusing x-rays scans at this time -Finally had a chest x-ray done which is clear at this time  5.  Refused palliative care consult.  Overall poor prognosis Continue to monitor   All the records are reviewed and case discussed with Care Management/Social Workerr. Management plans discussed with the patient, family and they are in agreement.  CODE STATUS: Full code  TOTAL TIME TAKING CARE OF THIS PATIENT: 36 minutes.   POSSIBLE D/C IN ? DAYS, DEPENDING ON CLINICAL CONDITION.   Enid Baas M.D on 06/17/2018 at 12:08 PM  Between 7am to 6pm - Pager - [phone]  After 6pm go to [email or web address] - Social research officer, government  [facility]  Office  [phone]  CC: Primary care physician; Alberteen Spindle, CNM

## 2018-06-17 NOTE — Progress Notes (Signed)
Per IR nurse, pt is not having the IR lumbar aspiration at all.. MD was notified.

## 2018-06-17 NOTE — Progress Notes (Signed)
*  PRELIMINARY RESULTS* Echocardiogram 2D Echocardiogram has been performed.  Andrea King 06/17/2018, 8:16 AM

## 2018-06-17 NOTE — Progress Notes (Signed)
Pt had visitor for approximately 20 minutes. After visitor left primary nurse rounded on pt. Pt seemed more lethargic than beginning of shift. Pt was A&O X 4 . Pt vitals was taken. Pt had temperature of 102.8. Pt given 2 tylenol for fever. Primary nurse informed pt that she was more lethargic than before and questioned pt did she use any drugs this evening. Pt stated that she did not. Primary nurse to continue to monitor pt closely.

## 2018-06-17 NOTE — Progress Notes (Signed)
Primary nurse rounded on pt and noted pt to be sound asleep with syring in hand. Primary nurse notified Charge Nurse Melissa. Primary RN  took the syring from pt and asked pt what was in the syring. Pt stated that it was water. Pt VSS; Primary nurse spoke to supervisor and paged and spoke to Dr. Desiree Hane. Orders received for [facility] sitter; cardiac monitoring; VS q 2 hr for 6 hr. Urine toxicity sample and A sign to be placed on door that no visitors are allowed without seeing nursing station. Primary nurse to continue to monitor pt.

## 2018-06-17 NOTE — Progress Notes (Signed)
Pt has a severity score of 5 on the RT protocol assessment. She has also refused each nebulizer treatment. Her nebulizer treatments have been changed to Q6prn.

## 2018-06-17 NOTE — Consult Note (Addendum)
Chief Complaint: Patient was seen in consultation today for discitis  Referring Physician(s): Dr. Rivka Safer  Supervising Physician: Richarda Overlie  Patient Status: [facility] - In-pt  History of Present Illness: Andrea King is a 43 y.o. 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  She now presents to [facility] with low back pain and fevers.  MRI Lumbar Spine yesterday shows: 1. Discitis-osteomyelitis at L5-S1 with ventral epidural phlegmon/abscess at the L5 and S1 levels causing mild spinal canal stenosis and moderate bilateral foraminal narrowing. 2. Prevertebral soft tissue enhancement at the L5 and sacral levels, likely paravertebral phlegmon. No psoas abscess.  IR consulted for disc aspiration.   Past Medical History:  Diagnosis Date  . Anemia   . Asthma   . DJD (degenerative joint disease)   . Hepatitis C   . Substance abuse [facility])     Past Surgical History:  Procedure Laterality Date  . INGUINAL HERNIA REPAIR Left 04/28/2013   Procedure: HERNIA REPAIR INGUINAL ADULT with mesh;  Surgeon: Cherylynn Ridges, MD;  Location: [facility] OR;  Service: General;  Laterality: Left;  . TEE WITHOUT CARDIOVERSION N/A 11/18/2015   Procedure: TRANSESOPHAGEAL ECHOCARDIOGRAM (TEE);  Surgeon: Iran Ouch, MD;  Location: [facility] ORS;  Service: Cardiovascular;  Laterality: N/A;  . TEE WITHOUT CARDIOVERSION N/A 12/31/2017   Procedure: TRANSESOPHAGEAL ECHOCARDIOGRAM (TEE);  Surgeon: Dalia Heading, MD;  Location: [facility] ORS;  Service: Cardiovascular;  Laterality: N/A;  . TEE WITHOUT CARDIOVERSION N/A 01/04/2018   Procedure: TRANSESOPHAGEAL ECHOCARDIOGRAM (TEE);  Surgeon: Dalia Heading, MD;  Location: [facility] ORS;  Service: Cardiovascular;  Laterality: N/A;  . TUMOR EXCISION     left scalpula    Allergies: Patient has no known  allergies.  Medications: Prior to Admission medications   Not on File     Family History  Problem Relation Age of Onset  . Cancer Mother   . Cancer - Colon Maternal Grandfather   . Diabetes Maternal Grandfather     Social History   Socioeconomic History  . Marital status: Single    Spouse name: Not on file  . Number of children: Not on file  . Years of education: Not on file  . Highest education level: Not on file  Occupational History  . Occupation: unemployed  Social Needs  . Financial resource strain: Not on file  . Food insecurity:    Worry: Not on file    Inability: Not on file  . Transportation needs:    Medical: Not on file    Non-medical: Not on file  Tobacco Use  . Smoking status: Current Every Day Smoker    Types: Cigarettes  . Smokeless tobacco: Never Used  Substance and Sexual Activity  . Alcohol use: No    Alcohol/week: 0.0 standard drinks  . Drug use: Yes    Comment: cocaine,Heroine  . Sexual activity: Yes    Birth control/protection: None  Lifestyle  . Physical activity:    Days per week: Not on file    Minutes per session: Not on file  . Stress: Not on file  Relationships  . Social connections:    Talks on phone: Not on file    Gets together: Not on file    Attends religious service: Not on file    Active member of club or organization: Not on file    Attends meetings of clubs or  organizations: Not on file    Relationship status: Not on file  Other Topics Concern  . Not on file  Social History Narrative  . Not on file     Review of Systems: A 12 point ROS discussed and pertinent positives are indicated in the HPI above.  All other systems are negative.  Review of Systems  Constitutional: Positive for fever. Negative for fatigue.  Respiratory: Negative for cough and shortness of breath.   Cardiovascular: Negative for chest pain.  Gastrointestinal: Negative for abdominal pain.  Musculoskeletal: Positive for back pain and neck pain.    Psychiatric/Behavioral: Negative for behavioral problems and confusion.    Vital Signs: BP 103/61   Pulse (!) 116   Temp (!) 103 F (39.4 C) (Oral) Comment: pre- tylenol vitals  Resp 19   Ht 5\' 2"  (1.575 m)   Wt 113 lb (51.3 kg)   LMP  (LMP Unknown)   SpO2 96%   BMI 20.67 kg/m   Physical Exam Vitals signs and nursing note reviewed.  Constitutional:      Appearance: Normal appearance.  Cardiovascular:     Rate and Rhythm: Normal rate and regular rhythm.     Heart sounds: No murmur. No friction rub. No gallop.   Pulmonary:     Effort: Pulmonary effort is normal. No respiratory distress.     Breath sounds: Normal breath sounds.  Musculoskeletal: Normal range of motion.  Neurological:     General: No focal deficit present.     Mental Status: She is alert and oriented to person, place, and time.  Psychiatric:        Mood and Affect: Mood normal.        Behavior: Behavior normal.        Thought Content: Thought content normal.        Judgment: Judgment normal.      MD Evaluation Airway: WNL Heart: WNL Abdomen: WNL Chest/ Lungs: WNL ASA  Classification: 3 Mallampati/Airway Score: One   Imaging: Mr Cervical Spine W Or Wo Contrast  Result Date: 06/16/2018 CLINICAL DATA:  Fever. Severe neck and low back pain. Recent cervical fusion for discitis-osteomyelitis. EXAM: MRI CERVICAL, THORACIC AND LUMBAR SPINE WITH AND WITHOUT CONTRAST TECHNIQUE: Multiplanar and multiecho pulse sequences of the cervical spine, to include the craniocervical junction and cervicothoracic junction, and thoracic and lumbar spine, were obtained with without intravenous contrast. COMPARISON:  Cervical spine MRI 03/03/2018 CONTRAST:  5 mL Gadavist FINDINGS: MRI CERVICAL SPINE FINDINGS Alignment: Physiologic. Vertebrae: Status post long segment anterior and posterior spinal fusion with anterior hardware from C3-C7 and posterior hardware from C2-T2. No evidence of active discitis-osteomyelitis. Cord:  Spinal cord signal is normal.  No epidural collection. Posterior Fossa, vertebral arteries, paraspinal tissues: Opacities in the lung apices are poorly characterized by MRI. Disc levels: There is no spinal canal stenosis or neural foraminal stenosis. MRI THORACIC SPINE FINDINGS Alignment:  Physiologic. Vertebrae: No fracture, evidence of discitis, or bone lesion. Cord:  Normal signal and morphology. Paraspinal and other soft tissues: Poorly visualized multifocal pulmonary opacities. Disc levels: No spinal canal or neural foraminal stenosis. MRI LUMBAR SPINE FINDINGS Segmentation:  Standard. Alignment:  Grade 1 anterolisthesis at L5-S1. Vertebrae: There is low T1-weighted signal within the endplates at L5-S1 with moderate contrast-enhancement. There is edema within the L5-S1 disc space. The pattern is consistent with discitis-osteomyelitis. Conus medullaris and cauda equina: Conus extends to the T12-L1 level. Conus and cauda equina appear normal. Paraspinal and other soft tissues: There is mild contrast  enhancement of the prevertebral soft tissues at the L5 and sacral levels. There is ventral epidural contrast enhancement at the L5 and S1 levels. Disc levels: No spinal canal or neural foraminal stenosis above the L4-5 level. L5-S1: Grade 1 anterolisthesis. Mild narrowing of the spinal canal due to the enhancing ventral epidural tissue. Abnormal enhancement extends into both neural foramina, which are moderately narrowed. IMPRESSION: 1. Discitis-osteomyelitis at L5-S1 with ventral epidural phlegmon/abscess at the L5 and S1 levels causing mild spinal canal stenosis and moderate bilateral foraminal narrowing. 2. Prevertebral soft tissue enhancement at the L5 and sacral levels, likely paravertebral phlegmon. No psoas abscess. 3. No cervical or thoracic discitis-osteomyelitis or epidural collection. 4. Long segment cervical anterior and posterior fusion without spinal canal stenosis. 5. Poorly characterized multifocal  pulmonary opacities. Correlation with chest radiography or chest CT is recommended. Electronically Signed   By: Deatra Robinson M.D.   On: 06/16/2018 03:24   Mr Thoracic Spine W Wo Contrast  Result Date: 06/16/2018 CLINICAL DATA:  Fever. Severe neck and low back pain. Recent cervical fusion for discitis-osteomyelitis. EXAM: MRI CERVICAL, THORACIC AND LUMBAR SPINE WITH AND WITHOUT CONTRAST TECHNIQUE: Multiplanar and multiecho pulse sequences of the cervical spine, to include the craniocervical junction and cervicothoracic junction, and thoracic and lumbar spine, were obtained with without intravenous contrast. COMPARISON:  Cervical spine MRI 03/03/2018 CONTRAST:  5 mL Gadavist FINDINGS: MRI CERVICAL SPINE FINDINGS Alignment: Physiologic. Vertebrae: Status post long segment anterior and posterior spinal fusion with anterior hardware from C3-C7 and posterior hardware from C2-T2. No evidence of active discitis-osteomyelitis. Cord: Spinal cord signal is normal.  No epidural collection. Posterior Fossa, vertebral arteries, paraspinal tissues: Opacities in the lung apices are poorly characterized by MRI. Disc levels: There is no spinal canal stenosis or neural foraminal stenosis. MRI THORACIC SPINE FINDINGS Alignment:  Physiologic. Vertebrae: No fracture, evidence of discitis, or bone lesion. Cord:  Normal signal and morphology. Paraspinal and other soft tissues: Poorly visualized multifocal pulmonary opacities. Disc levels: No spinal canal or neural foraminal stenosis. MRI LUMBAR SPINE FINDINGS Segmentation:  Standard. Alignment:  Grade 1 anterolisthesis at L5-S1. Vertebrae: There is low T1-weighted signal within the endplates at L5-S1 with moderate contrast-enhancement. There is edema within the L5-S1 disc space. The pattern is consistent with discitis-osteomyelitis. Conus medullaris and cauda equina: Conus extends to the T12-L1 level. Conus and cauda equina appear normal. Paraspinal and other soft tissues: There is  mild contrast enhancement of the prevertebral soft tissues at the L5 and sacral levels. There is ventral epidural contrast enhancement at the L5 and S1 levels. Disc levels: No spinal canal or neural foraminal stenosis above the L4-5 level. L5-S1: Grade 1 anterolisthesis. Mild narrowing of the spinal canal due to the enhancing ventral epidural tissue. Abnormal enhancement extends into both neural foramina, which are moderately narrowed. IMPRESSION: 1. Discitis-osteomyelitis at L5-S1 with ventral epidural phlegmon/abscess at the L5 and S1 levels causing mild spinal canal stenosis and moderate bilateral foraminal narrowing. 2. Prevertebral soft tissue enhancement at the L5 and sacral levels, likely paravertebral phlegmon. No psoas abscess. 3. No cervical or thoracic discitis-osteomyelitis or epidural collection. 4. Long segment cervical anterior and posterior fusion without spinal canal stenosis. 5. Poorly characterized multifocal pulmonary opacities. Correlation with chest radiography or chest CT is recommended. Electronically Signed   By: Deatra Robinson M.D.   On: 06/16/2018 03:24   Mr Lumbar Spine W Wo Contrast  Result Date: 06/16/2018 CLINICAL DATA:  Fever. Severe neck and low back pain. Recent cervical fusion for discitis-osteomyelitis.  EXAM: MRI CERVICAL, THORACIC AND LUMBAR SPINE WITH AND WITHOUT CONTRAST TECHNIQUE: Multiplanar and multiecho pulse sequences of the cervical spine, to include the craniocervical junction and cervicothoracic junction, and thoracic and lumbar spine, were obtained with without intravenous contrast. COMPARISON:  Cervical spine MRI 03/03/2018 CONTRAST:  5 mL Gadavist FINDINGS: MRI CERVICAL SPINE FINDINGS Alignment: Physiologic. Vertebrae: Status post long segment anterior and posterior spinal fusion with anterior hardware from C3-C7 and posterior hardware from C2-T2. No evidence of active discitis-osteomyelitis. Cord: Spinal cord signal is normal.  No epidural collection. Posterior  Fossa, vertebral arteries, paraspinal tissues: Opacities in the lung apices are poorly characterized by MRI. Disc levels: There is no spinal canal stenosis or neural foraminal stenosis. MRI THORACIC SPINE FINDINGS Alignment:  Physiologic. Vertebrae: No fracture, evidence of discitis, or bone lesion. Cord:  Normal signal and morphology. Paraspinal and other soft tissues: Poorly visualized multifocal pulmonary opacities. Disc levels: No spinal canal or neural foraminal stenosis. MRI LUMBAR SPINE FINDINGS Segmentation:  Standard. Alignment:  Grade 1 anterolisthesis at L5-S1. Vertebrae: There is low T1-weighted signal within the endplates at L5-S1 with moderate contrast-enhancement. There is edema within the L5-S1 disc space. The pattern is consistent with discitis-osteomyelitis. Conus medullaris and cauda equina: Conus extends to the T12-L1 level. Conus and cauda equina appear normal. Paraspinal and other soft tissues: There is mild contrast enhancement of the prevertebral soft tissues at the L5 and sacral levels. There is ventral epidural contrast enhancement at the L5 and S1 levels. Disc levels: No spinal canal or neural foraminal stenosis above the L4-5 level. L5-S1: Grade 1 anterolisthesis. Mild narrowing of the spinal canal due to the enhancing ventral epidural tissue. Abnormal enhancement extends into both neural foramina, which are moderately narrowed. IMPRESSION: 1. Discitis-osteomyelitis at L5-S1 with ventral epidural phlegmon/abscess at the L5 and S1 levels causing mild spinal canal stenosis and moderate bilateral foraminal narrowing. 2. Prevertebral soft tissue enhancement at the L5 and sacral levels, likely paravertebral phlegmon. No psoas abscess. 3. No cervical or thoracic discitis-osteomyelitis or epidural collection. 4. Long segment cervical anterior and posterior fusion without spinal canal stenosis. 5. Poorly characterized multifocal pulmonary opacities. Correlation with chest radiography or chest CT  is recommended. Electronically Signed   By: Deatra RobinsonKevin  Herman M.D.   On: 06/16/2018 03:24   Dg Chest Port 1 View  Result Date: 06/16/2018 CLINICAL DATA:  Fever.  History of substance abuse and hepatitis C. EXAM: PORTABLE CHEST 1 VIEW COMPARISON:  03/03/2018 FINDINGS: There changes from prior cardiac surgery and valve replacement. Cardiac silhouette is normal in size. No mediastinal or hilar masses. No evidence of adenopathy. Lungs are clear.  No pleural effusion or pneumothorax. There has been a long posterior and anterior cervical spine fusion, new since the prior radiographs. No acute skeletal abnormality. IMPRESSION: No acute cardiopulmonary disease. Electronically Signed   By: Amie Portlandavid  Ormond M.D.   On: 06/16/2018 17:27    Labs:  CBC: Recent Labs    03/03/18 1709 03/04/18 1017 06/15/18 2158 06/17/18 0705  WBC 28.3* 32.0* 19.8* 13.3*  HGB 10.9* 9.2* 10.3* 8.4*  HCT 32.3* 27.9* 33.2* 27.1*  PLT 168 210 351 284    COAGS: Recent Labs    06/15/18 2158  INR 1.05    BMP: Recent Labs    01/04/18 0535 01/11/18 0519 03/03/18 1709 06/15/18 2158 06/17/18 0705  NA 138  --  128* 133* 136  K 4.6  --  3.1* 3.9 3.6  CL 101  --  94* 102 106  CO2 30  --  22 24 25   GLUCOSE 110*  --  161* 112* 111*  BUN 15  --  37* 16 16  CALCIUM 9.5  --  8.5* 9.1 8.5*  CREATININE 0.55 0.60 1.31* 0.89 0.85  GFRNONAA >60 >60 49* >60 >60  GFRAA >60 >60 56* >60 >60    LIVER FUNCTION TESTS: Recent Labs    09/21/17 0325 12/28/17 0736 03/03/18 1709 06/15/18 2158  BILITOT 0.7 0.7 0.5 1.0  AST 15 15 26  11*  ALT 10* 8 11 8   ALKPHOS 45 74 144* 62  PROT 5.7* 7.9 7.2 8.2*  ALBUMIN 2.8* 3.5 2.6* 3.9    TUMOR MARKERS: No results for input(s): AFPTM, CEA, CA199, CHROMGRNA in the last 8760 hours.  Assessment and Plan: L5-S1 discitis-osteomyelitis IR consulted for disc aspiration at the request of ID.  Patient has been treated for the past several months for bacteremia, endocarditis, and osteomyelitis.  She was not taking her antibiotics as prescribed.  WBC 13. Currently on vanc and cefepime.  Continues to spike fevers- 103F this afternoon.  Will plan for procedure once IR MD available due to location of infection- this will likely be as early as Tuesday of next week.  Discussed with Dr. Nemiah Commander who is aware of timeline for procedure. PA to bedside to discuss with patient who provides consent.   Risks and benefits of aspiration was discussed with the patient and/or patient's family including, but not limited to bleeding, infection, damage to adjacent structures or low yield requiring additional tests.  All of the questions were answered and there is agreement to proceed.  Consent signed and in chart.   Thank you for this interesting consult.  I greatly enjoyed meeting Andrea King and look forward to participating in their care.  A copy of this report was sent to the requesting provider on this date.  Electronically Signed: Hoyt Koch, PA 06/17/2018, 2:37 PM   I spent a total of 20 Minutes    in face to face in clinical consultation, greater than 50% of which was counseling/coordinating care for osteomyelitis.

## 2018-06-17 NOTE — Progress Notes (Signed)
Per IR PA, Baird Lyons. Pt will have the procedure, Image guided aspiration next week due to Spine MD availability.  She has been consented by PA.

## 2018-06-17 NOTE — Progress Notes (Signed)
Initial Nutrition Assessment  DOCUMENTATION CODES:   Non-severe (moderate) malnutrition in context of chronic illness  INTERVENTION:  Ensure Enlive po BID, each supplement provides 350 kcal and 20 grams of protein (pt only likes vanilla)  Magic cup TID with meals, each supplement provides 290 kcal and 9 grams of protein (pt requested OC)  MVI   NUTRITION DIAGNOSIS:   Moderate Malnutrition related to chronic illness as evidenced by moderate muscle depletion, mild fat depletion, percent weight loss.   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   Patient is a current user IV heroin and found with a syringe in her hand this morning per RN note.    Patient returning to bed after receiving pt care with the assistance of RN at time of RD visit and appears to be in pain. Lunch tray in room with 100% intake and patient reports enjoying the chicken and mashed potatoes.   Prior to admission pt stated that she barely ate anything d/t severe back pain and reports 1 meal/day if she did eat. Pt unable to provide RD with recall.   Pt recalls UBW of 130 lbs approximately 6 months ago, 17lbs wt loss x 6 months (13%: severe)  Patient reports liking Vanilla Ensure and open to orange cream [facility]  NUTRITION - FOCUSED PHYSICAL EXAM:    Diet Order:   Diet Order            Diet regular Room service appropriate? Yes; Fluid consistency:  Thin  Diet effective now              EDUCATION NEEDS:   No education needs have been identified at this time  Skin:  Skin Assessment: Reviewed RN Assessment  Last BM:  2/11  Height:   Ht Readings from Last 1 Encounters:  06/15/18 5\' 2"  (1.575 m)    Weight:   Wt Readings from Last 1 Encounters:  06/15/18 51.3 kg    Ideal Body Weight:  50.1 kg  BMI:  Body mass index is 20.67 kg/m.  Estimated Nutritional Needs:   Kcal:  4163-8453  Protein:  66-82 grams  Fluid:  1.8L/day    Lars Masson, RD, LDN  After Hours/Weekend Pager: [phone]

## 2018-06-17 NOTE — Progress Notes (Signed)
Date of Admission:  06/15/2018      ID: Andrea King is a 43 y.o. female  Active Problems: L5-S1 discitis    Subjective: Still has back pain Fever present   Medications:  . clonazePAM  1 mg Oral BID  . docusate sodium  100 mg Oral BID  . feeding supplement (ENSURE ENLIVE)  237 mL Oral BID BM  . gabapentin  200 mg Oral TID  . methadone  30 mg Oral Daily  . multivitamin with minerals  1 tablet Oral Daily  . sodium chloride flush  10-40 mL Intracatheter Q12H  . vitamin C  250 mg Oral BID    Objective: Vital signs in last 24 hours: Temp:  [98.9 F (37.2 C)-103 F (39.4 C)] 98.9 F (37.2 C) (02/14 1620) Pulse Rate:  [76-116] 93 (02/14 1540) Resp:  [14-20] 19 (02/14 1248) BP: (90-141)/(53-75) 103/61 (02/14 1248) SpO2:  [93 %-99 %] 96 % (02/14 1248)  PHYSICAL EXAM:  General: Alert, cooperative, no distress currently , Lungs: b/l air entry Heart: systolic murmur  Abdomen: Soft, non-tender,not distended. Bowel sounds normal. No masses Extremities: b/l knee surgical scar Skin: No rashes or lesions. Or bruising Lymph: Cervical, supraclavicular normal. Neurologic: Grossly non-focal  Lab Results Recent Labs    06/15/18 2158 06/17/18 0705  WBC 19.8* 13.3*  HGB 10.3* 8.4*  HCT 33.2* 27.1*  NA 133* 136  K 3.9 3.6  CL 102 106  CO2 24 25  BUN 16 16  CREATININE 0.89 0.85   Liver Panel Recent Labs    06/15/18 2158  PROT 8.2*  ALBUMIN 3.9  AST 11*  ALT 8  ALKPHOS 62  BILITOT 1.0   Sedimentation Rate No results for input(s): ESRSEDRATE in the last 72 hours. C-Reactive Protein No results for input(s): CRP in the last 72 hours.  Microbiology:  Studies/Results: Mr Cervical Spine W Or Wo Contrast  Result Date: 06/16/2018 CLINICAL DATA:  Fever. Severe neck and low back pain. Recent cervical fusion for discitis-osteomyelitis. EXAM: MRI CERVICAL, THORACIC AND LUMBAR SPINE WITH AND WITHOUT CONTRAST TECHNIQUE: Multiplanar and multiecho pulse sequences of the  cervical spine, to include the craniocervical junction and cervicothoracic junction, and thoracic and lumbar spine, were obtained with without intravenous contrast. COMPARISON:  Cervical spine MRI 03/03/2018 CONTRAST:  5 mL Gadavist FINDINGS: MRI CERVICAL SPINE FINDINGS Alignment: Physiologic. Vertebrae: Status post long segment anterior and posterior spinal fusion with anterior hardware from C3-C7 and posterior hardware from C2-T2. No evidence of active discitis-osteomyelitis. Cord: Spinal cord signal is normal.  No epidural collection. Posterior Fossa, vertebral arteries, paraspinal tissues: Opacities in the lung apices are poorly characterized by MRI. Disc levels: There is no spinal canal stenosis or neural foraminal stenosis. MRI THORACIC SPINE FINDINGS Alignment:  Physiologic. Vertebrae: No fracture, evidence of discitis, or bone lesion. Cord:  Normal signal and morphology. Paraspinal and other soft tissues: Poorly visualized multifocal pulmonary opacities. Disc levels: No spinal canal or neural foraminal stenosis. MRI LUMBAR SPINE FINDINGS Segmentation:  Standard. Alignment:  Grade 1 anterolisthesis at L5-S1. Vertebrae: There is low T1-weighted signal within the endplates at L5-S1 with moderate contrast-enhancement. There is edema within the L5-S1 disc space. The pattern is consistent with discitis-osteomyelitis. Conus medullaris and cauda equina: Conus extends to the T12-L1 level. Conus and cauda equina appear normal. Paraspinal and other soft tissues: There is mild contrast enhancement of the prevertebral soft tissues at the L5 and sacral levels. There is ventral epidural contrast enhancement at the L5 and S1 levels. Disc  levels: No spinal canal or neural foraminal stenosis above the L4-5 level. L5-S1: Grade 1 anterolisthesis. Mild narrowing of the spinal canal due to the enhancing ventral epidural tissue. Abnormal enhancement extends into both neural foramina, which are moderately narrowed. IMPRESSION: 1.  Discitis-osteomyelitis at L5-S1 with ventral epidural phlegmon/abscess at the L5 and S1 levels causing mild spinal canal stenosis and moderate bilateral foraminal narrowing. 2. Prevertebral soft tissue enhancement at the L5 and sacral levels, likely paravertebral phlegmon. No psoas abscess. 3. No cervical or thoracic discitis-osteomyelitis or epidural collection. 4. Long segment cervical anterior and posterior fusion without spinal canal stenosis. 5. Poorly characterized multifocal pulmonary opacities. Correlation with chest radiography or chest CT is recommended. Electronically Signed   By: Deatra Robinson M.D.   On: 06/16/2018 03:24   Mr Thoracic Spine W Wo Contrast  Result Date: 06/16/2018 CLINICAL DATA:  Fever. Severe neck and low back pain. Recent cervical fusion for discitis-osteomyelitis. EXAM: MRI CERVICAL, THORACIC AND LUMBAR SPINE WITH AND WITHOUT CONTRAST TECHNIQUE: Multiplanar and multiecho pulse sequences of the cervical spine, to include the craniocervical junction and cervicothoracic junction, and thoracic and lumbar spine, were obtained with without intravenous contrast. COMPARISON:  Cervical spine MRI 03/03/2018 CONTRAST:  5 mL Gadavist FINDINGS: MRI CERVICAL SPINE FINDINGS Alignment: Physiologic. Vertebrae: Status post long segment anterior and posterior spinal fusion with anterior hardware from C3-C7 and posterior hardware from C2-T2. No evidence of active discitis-osteomyelitis. Cord: Spinal cord signal is normal.  No epidural collection. Posterior Fossa, vertebral arteries, paraspinal tissues: Opacities in the lung apices are poorly characterized by MRI. Disc levels: There is no spinal canal stenosis or neural foraminal stenosis. MRI THORACIC SPINE FINDINGS Alignment:  Physiologic. Vertebrae: No fracture, evidence of discitis, or bone lesion. Cord:  Normal signal and morphology. Paraspinal and other soft tissues: Poorly visualized multifocal pulmonary opacities. Disc levels: No spinal canal or  neural foraminal stenosis. MRI LUMBAR SPINE FINDINGS Segmentation:  Standard. Alignment:  Grade 1 anterolisthesis at L5-S1. Vertebrae: There is low T1-weighted signal within the endplates at L5-S1 with moderate contrast-enhancement. There is edema within the L5-S1 disc space. The pattern is consistent with discitis-osteomyelitis. Conus medullaris and cauda equina: Conus extends to the T12-L1 level. Conus and cauda equina appear normal. Paraspinal and other soft tissues: There is mild contrast enhancement of the prevertebral soft tissues at the L5 and sacral levels. There is ventral epidural contrast enhancement at the L5 and S1 levels. Disc levels: No spinal canal or neural foraminal stenosis above the L4-5 level. L5-S1: Grade 1 anterolisthesis. Mild narrowing of the spinal canal due to the enhancing ventral epidural tissue. Abnormal enhancement extends into both neural foramina, which are moderately narrowed. IMPRESSION: 1. Discitis-osteomyelitis at L5-S1 with ventral epidural phlegmon/abscess at the L5 and S1 levels causing mild spinal canal stenosis and moderate bilateral foraminal narrowing. 2. Prevertebral soft tissue enhancement at the L5 and sacral levels, likely paravertebral phlegmon. No psoas abscess. 3. No cervical or thoracic discitis-osteomyelitis or epidural collection. 4. Long segment cervical anterior and posterior fusion without spinal canal stenosis. 5. Poorly characterized multifocal pulmonary opacities. Correlation with chest radiography or chest CT is recommended. Electronically Signed   By: Deatra Robinson M.D.   On: 06/16/2018 03:24   Mr Lumbar Spine W Wo Contrast  Result Date: 06/16/2018 CLINICAL DATA:  Fever. Severe neck and low back pain. Recent cervical fusion for discitis-osteomyelitis. EXAM: MRI CERVICAL, THORACIC AND LUMBAR SPINE WITH AND WITHOUT CONTRAST TECHNIQUE: Multiplanar and multiecho pulse sequences of the cervical spine, to include the craniocervical  junction and  cervicothoracic junction, and thoracic and lumbar spine, were obtained with without intravenous contrast. COMPARISON:  Cervical spine MRI 03/03/2018 CONTRAST:  5 mL Gadavist FINDINGS: MRI CERVICAL SPINE FINDINGS Alignment: Physiologic. Vertebrae: Status post long segment anterior and posterior spinal fusion with anterior hardware from C3-C7 and posterior hardware from C2-T2. No evidence of active discitis-osteomyelitis. Cord: Spinal cord signal is normal.  No epidural collection. Posterior Fossa, vertebral arteries, paraspinal tissues: Opacities in the lung apices are poorly characterized by MRI. Disc levels: There is no spinal canal stenosis or neural foraminal stenosis. MRI THORACIC SPINE FINDINGS Alignment:  Physiologic. Vertebrae: No fracture, evidence of discitis, or bone lesion. Cord:  Normal signal and morphology. Paraspinal and other soft tissues: Poorly visualized multifocal pulmonary opacities. Disc levels: No spinal canal or neural foraminal stenosis. MRI LUMBAR SPINE FINDINGS Segmentation:  Standard. Alignment:  Grade 1 anterolisthesis at L5-S1. Vertebrae: There is low T1-weighted signal within the endplates at L5-S1 with moderate contrast-enhancement. There is edema within the L5-S1 disc space. The pattern is consistent with discitis-osteomyelitis. Conus medullaris and cauda equina: Conus extends to the T12-L1 level. Conus and cauda equina appear normal. Paraspinal and other soft tissues: There is mild contrast enhancement of the prevertebral soft tissues at the L5 and sacral levels. There is ventral epidural contrast enhancement at the L5 and S1 levels. Disc levels: No spinal canal or neural foraminal stenosis above the L4-5 level. L5-S1: Grade 1 anterolisthesis. Mild narrowing of the spinal canal due to the enhancing ventral epidural tissue. Abnormal enhancement extends into both neural foramina, which are moderately narrowed. IMPRESSION: 1. Discitis-osteomyelitis at L5-S1 with ventral epidural  phlegmon/abscess at the L5 and S1 levels causing mild spinal canal stenosis and moderate bilateral foraminal narrowing. 2. Prevertebral soft tissue enhancement at the L5 and sacral levels, likely paravertebral phlegmon. No psoas abscess. 3. No cervical or thoracic discitis-osteomyelitis or epidural collection. 4. Long segment cervical anterior and posterior fusion without spinal canal stenosis. 5. Poorly characterized multifocal pulmonary opacities. Correlation with chest radiography or chest CT is recommended. Electronically Signed   By: Deatra Robinson M.D.   On: 06/16/2018 03:24   Dg Chest Port 1 View  Result Date: 06/16/2018 CLINICAL DATA:  Fever.  History of substance abuse and hepatitis C. EXAM: PORTABLE CHEST 1 VIEW COMPARISON:  03/03/2018 FINDINGS: There changes from prior cardiac surgery and valve replacement. Cardiac silhouette is normal in size. No mediastinal or hilar masses. No evidence of adenopathy. Lungs are clear.  No pleural effusion or pneumothorax. There has been a long posterior and anterior cervical spine fusion, new since the prior radiographs. No acute skeletal abnormality. IMPRESSION: No acute cardiopulmonary disease. Electronically Signed   By: Amie Portland M.D.   On: 06/16/2018 17:27     Assessment/Plan: 43 year old female with a complicated medical history.  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 ?Discitis and osteomyelitis of L5-S1 area.  This is not new and was  seen in the MRI from 03/17/2018 at [facility] when she was getting treatment for MRSA with vancomycin and also was on ceftaroline and then cefepime.  She now has worsening low back pain and fever which could be because she stopped doxycycline last week or it could be that this is a new infection.  Discussed with radiologist  to see whether they would be able to aspirate the disc  space. IR cannot do it until next week  Recent MRSA bacteremia with extensive discitis of the cervical spine leading to anterior and posterior fusion extending from C2-T2 Treated with IV vancomycin for at least 8 weeks in NOV-Dec 2019  followed by oral doxycycline which she has to take indefinitely but she ran out of it 2 weeks ago   Recent bilateral septic arthritis of the knee status post washout MRSA in the left knee in Nov 2019  Recent Serratia bacteremia treated with nearly 4 weeks  of IV antibiotic in Nov 2019  Recent enterococcus bacteremia which was treated with IV vancomycin as well in Nov 2019  History of tricuspid valve endocarditis status post TAVR in 2017  She is currently on vancomycin and Zosyn - can change the latter to cefepime Syringe found in her hand with concern for manipulation IV - tele sitter in the room  Await blood culture  Discussed the management with patient and her nurse.  ID will follow her peri[pherally this weekend- call if needed

## 2018-06-18 LAB — CBC
HCT: 29.9 % — ABNORMAL LOW (ref 36.0–46.0)
Hemoglobin: 9 g/dL — ABNORMAL LOW (ref 12.0–15.0)
MCH: 25.8 pg — ABNORMAL LOW (ref 26.0–34.0)
MCHC: 30.1 g/dL (ref 30.0–36.0)
MCV: 85.7 fL (ref 80.0–100.0)
NRBC: 0 % (ref 0.0–0.2)
Platelets: 291 10*3/uL (ref 150–400)
RBC: 3.49 MIL/uL — AB (ref 3.87–5.11)
RDW: 17.1 % — ABNORMAL HIGH (ref 11.5–15.5)
WBC: 9 10*3/uL (ref 4.0–10.5)

## 2018-06-18 LAB — BASIC METABOLIC PANEL
Anion gap: 6 (ref 5–15)
BUN: 19 mg/dL (ref 6–20)
CHLORIDE: 107 mmol/L (ref 98–111)
CO2: 28 mmol/L (ref 22–32)
Calcium: 8.8 mg/dL — ABNORMAL LOW (ref 8.9–10.3)
Creatinine, Ser: 0.83 mg/dL (ref 0.44–1.00)
GFR calc Af Amer: >60 mL/min (ref >60)
GFR calc non Af Amer: >60 mL/min (ref >60)
Glucose, Bld: 102 mg/dL — ABNORMAL HIGH (ref 70–99)
POTASSIUM: 3.7 mmol/L (ref 3.5–5.1)
Sodium: 141 mmol/L (ref 135–145)

## 2018-06-18 MED ORDER — IBUPROFEN 400 MG PO TABS: 400.0000 mg | ORAL_TABLET | Freq: Four times a day (QID) | ORAL | Status: DC | PRN

## 2018-06-18 MED ORDER — SULFAMETHOXAZOLE-TRIMETHOPRIM 800-160 MG PO TABS: 1.0000 | ORAL_TABLET | Freq: Two times a day (BID) | ORAL | 0 refills | Status: DC

## 2018-06-18 MED ORDER — OXYCODONE-ACETAMINOPHEN 7.5-325 MG PO TABS
1.0000 | ORAL_TABLET | ORAL | Status: DC | PRN
  Administered 2018-06-18: 1 via ORAL
  Filled 2018-06-18: qty 1

## 2018-06-18 MED ORDER — IBUPROFEN 400 MG PO TABS: 400.0000 mg | ORAL_TABLET | Freq: Three times a day (TID) | ORAL | Status: DC | PRN

## 2018-06-18 NOTE — Progress Notes (Signed)
[facility] [place] [facility]   PATIENT NAME: Andrea King    MR#:  [id number]  DATE OF BIRTH:  1975-09-17  SUBJECTIVE:  CHIEF COMPLAINT:   Chief Complaint  Patient presents with  . Back Pain   Continues to complain of low back pain. Febrile  -Has a telemetry sitter as patient was found to have a syringe in her hand and lethargic a day back  REVIEW OF SYSTEMS:  Review of Systems  Constitutional: Positive for fever and malaise/fatigue. Negative for chills.  Respiratory: Negative for cough, shortness of breath and wheezing.   Cardiovascular: Negative for chest pain and palpitations.  Gastrointestinal: Negative for abdominal pain, constipation, diarrhea, nausea and vomiting.  Genitourinary: Negative for dysuria.  Musculoskeletal: Positive for back pain, joint pain and myalgias.  Neurological: Negative for dizziness, seizures and headaches.  Psychiatric/Behavioral: Positive for substance abuse. The patient is nervous/anxious.     DRUG ALLERGIES:  No Known Allergies  VITALS:  Blood pressure 107/60, pulse 80, temperature 98.9 F (37.2 C), temperature source Oral, resp. rate 18, height 5\' 2"  (1.575 m), weight 51.3 kg, SpO2 97 %.  PHYSICAL EXAMINATION:  Physical Exam  GENERAL:  43 y.o.-year-old patient lying in the bed EYES: Pupils equal, round, reactive to light and accommodation. No scleral icterus. Extraocular muscles intact.  HEENT: Head atraumatic, normocephalic. Oropharynx and nasopharynx clear.  NECK:  Supple, no jugular venous distention. No thyroid enlargement, no tenderness.  LUNGS: Normal breath sounds bilaterally, no wheezing, rales,rhonchi or crepitation. No use of accessory muscles of respiration.  Decreased bibasilar breath sounds CARDIOVASCULAR: S1, S2 normal. No  rubs, or gallops.  2/6 systolic murmur is present ABDOMEN: Soft, nontender, nondistended. Bowel sounds present. No organomegaly or mass.  EXTREMITIES: No pedal edema,  cyanosis, or clubbing.  NEUROLOGIC: Cranial nerves II through XII are intact. Muscle strength 5/5 in all extremities. Sensation intact. Gait not checked.  Overall weakness noted PSYCHIATRIC: The patient is sleepy, but easily arousable and oriented SKIN: No obvious rash, lesion, or ulcer.    LABORATORY PANEL:   CBC Recent Labs  Lab 06/18/18 0557  WBC 9.0  HGB 9.0*  HCT 29.9*  PLT 291   ------------------------------------------------------------------------------------------------------------------  Chemistries  Recent Labs  Lab 06/15/18 2158  06/18/18 0557  NA 133*   < > 141  K 3.9   < > 3.7  CL 102   < > 107  CO2 24   < > 28  GLUCOSE 112*   < > 102*  BUN 16   < > 19  CREATININE 0.89   < > 0.83  CALCIUM 9.1   < > 8.8*  AST 11*  --   --   ALT 8  --   --   ALKPHOS 62  --   --   BILITOT 1.0  --   --    < > = values in this interval not displayed.   ------------------------------------------------------------------------------------------------------------------  Cardiac Enzymes No results for input(s): TROPONINI in the last 168 hours. ------------------------------------------------------------------------------------------------------------------  RADIOLOGY:  Dg Chest Port 1 View  Result Date: 06/16/2018 CLINICAL DATA:  Fever.  History of substance abuse and hepatitis C. EXAM: PORTABLE CHEST 1 VIEW COMPARISON:  03/03/2018 FINDINGS: There changes from prior cardiac surgery and valve replacement. Cardiac silhouette is normal in size. No mediastinal or hilar masses. No evidence of adenopathy. Lungs are clear.  No pleural effusion or pneumothorax. There has been a long posterior and anterior cervical spine fusion, new since the prior radiographs. No  acute skeletal abnormality. IMPRESSION: No acute cardiopulmonary disease. Electronically Signed   By: Amie Portland M.D.   On: 06/16/2018 17:27    EKG:   Orders placed or performed during the hospital encounter of 06/15/18    . EKG 12-Lead  . EKG 12-Lead    ASSESSMENT AND PLAN:   42 year old female with complicated history of IV drug abuse-prolonged hospitalization at [facility] in December 2019 for MRSA bacteremia with discitis/osteomyelitis status post cervical corpectomy and tricuspid endocarditis status post valve replacement presents to hospital with low back pain and fevers.  1.  Lumbar L5-S1 osteomyelitis/discitis-MRI from December 2019 also showed osteomyelitis and phlegmon at that region.  But since she had multiple procedures done at the time, she was discharged with outpatient follow-up was supposed to be on doxycycline prophylaxis which she did not take for the last 2 weeks according to patient. - she had MRSA, serratia and enterococcus bacteremia at that time -Admitted with fevers and chills, cultures are pending -Appreciate ID consult.  Requested IR guided biopsy for cultures, however not able to be done until Tuesday of next week. -Discussed with neurosurgery-they have clearly mentioned that they will not be doing any surgical procedures at this time. -No psoas abscess noted. -On broad-spectrum antibiotics   2.  Pain control-pain control has been an issue due to her high threshold for pain meds -Started on methadone.  She was discharged on Suboxone and no opioids while in December.  However patient continues to use IV opioids as outpatient. -Continue to monitor Norco changed to Percocet today  3.  Anxiety and withdrawal from benzos-started on Klonopin   4.  Pulmonary opacities-patient did have septic pulmonary emboli in December.  Status post tricuspid valve surgery and repeat bacteremia.  However was not a candidate for repeat surgery -Chest x-ray ordered, however patient has been refusing x-rays scans at this time -Finally had a chest x-ray done which was clear  Overall poor prognosis Continue to monitor  All the records are reviewed and case discussed with Care Management/Social  Workerr. Management plans discussed with the patient, family and they are in agreement.  CODE STATUS: Full code  TOTAL TIME TAKING CARE OF THIS PATIENT: 36 minutes.    Molinda Bailiff Rashauna Tep M.D on 06/18/2018 at 9:48 AM  Between 7am to 6pm - Pager - [phone]  After 6pm go to [email or web address] - Social research officer, government  [facility]  Office  [phone]  CC: Primary care physician; Alberteen Spindle, CNM

## 2018-06-18 NOTE — Progress Notes (Addendum)
It was reported by the tele sitter that the patient had a female in the room who showed up for about two minutes and left the room. The patient was seen by the nurse after a few minutes after tele calling. The patient was informed by the nurse to not attempt to use any medications non prescribed. The patient confirmed she would not use any meds/drugs. 5 minutes later the tele sitter called to indicate the patient was doing something under her covers when the nurse step in to the room to talk to the patient. The patient had her knees drawn up to her chest. When I told her I would help her fix her covers she agreed. A syringe with a needle filled with fluid (black color) was found. The patient was informed that the needle and syringe had to be removed from the room. The RN called hospital police, the hospital supervisor and MD. The patient decided that she would leave as she did not want a personal sitter in the room or have the restrictions of no visitors in her room. IV was removed. Personal medications returned from pharmacy and AMA documents were signed.

## 2018-06-18 NOTE — Progress Notes (Signed)
The patient has left AMA. Documents have been signed. Midline peripheral IV removed.

## 2018-06-18 NOTE — Progress Notes (Signed)
The patient wants to leave since she does not want a Sports coach or restrictions on visitors.

## 2018-06-18 NOTE — Progress Notes (Signed)
MD messaged:  Can we order a tele sitter for the patient to prevent her from using drug/meds in her room brought from outside/home. Also she will like more information about possible transfer to [facility] hospital.

## 2018-06-19 NOTE — Progress Notes (Signed)
Paged by nurse regarding patient having a visitor after which patient was found to have a syringe she drew some unknown fluid and was ready to inject.  Remote tele sitter raise concern.  Patient accepted to being addicted to drugs.  Discussed with patient at length regarding patient not repeating this.  Requested a Comptroller.  Patient unhappy and would like to be discharged.  But patient continues to be febrile with acute infection.  Advised that patient stay in the hospital for further work-up and treatment.  She is wanting to leave AGAINST MEDICAL ADVICE.  In spite of discussing at length regarding patient's lab work, vitals, fever and need for further biopsy and IV antibiotics patient is wanting to leave.  Due to acute illness I have given prescription for Bactrim DS for 1 week.  I do not think this would be enough but at least would control the infection.  Patient not agreeing to stay in the hospital.  Left AGAINST MEDICAL ADVICE.  Time spent in prolonged care 35 minutes.

## 2018-06-20 LAB — CULTURE, BLOOD (ROUTINE X 2)
Culture: NO GROWTH
Culture: NO GROWTH
Special Requests: ADEQUATE
Special Requests: ADEQUATE

## 2018-06-20 NOTE — Discharge Summary (Signed)
[employer] - [place] at [facility]   PATIENT NAME: Andrea King    MR#:  [id number]  DATE OF BIRTH:  05-13-1975  DATE OF ADMISSION:  06/15/2018 ADMITTING PHYSICIAN: Bertrum Sol, MD  DATE OF DISCHARGE: 06/18/2018  1:47 PM  PRIMARY CARE PHYSICIAN: Alberteen Spindle, CNM   ADMISSION DIAGNOSIS:  Fever [R50.9] Abnormal CXR [R93.89] Fever, unspecified fever cause [R50.9] Midline low back pain without sciatica, unspecified chronicity [M54.5]  DISCHARGE DIAGNOSIS:  Active Problems:   Spinal epidural abscess   SECONDARY DIAGNOSIS:   Past Medical History:  Diagnosis Date  . Anemia   . Asthma   . DJD (degenerative joint disease)   . Hepatitis C   . Substance abuse (HCC)      ADMITTING HISTORY  HISTORY OF PRESENT ILLNESS: Andrea King  is a 43 y.o. 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, septic knee, just discharged from [facility] on the first of this month -currently not on antibiotics, presenting to our hospital for 2-3 day history of acute on chronic low back pain that is 10 out of 10, associated with fever, in the emergency room patient was found to have discitis-osteomyelitis at L5-S1 with ventral epidural phlegmon/abscess at the L5/S1 levels causing mild spinal canal stenosis/moderate bilateral foraminal narrowing, case discussed with neurosurgery-Dr. Marcell Barlow who recommended admission to our hospital for continued care, urine drug screen noted for benzos/cannabinoids/opioids, white count of 19,000, UA negative, patient evaluated in the emergency room, no apparent distress, resting comfortably in bed, patient is now been admitted for acute recurrent sepsis secondary to acute recurrent L5/S1 spinal  abscess.  HOSPITAL COURSE:   43 year old female with complicated history of IV drug abuse-prolonged hospitalization at [facility] in December 2019 for MRSA bacteremia with discitis/osteomyelitis status post cervical corpectomy and tricuspid endocarditis status post valve replacement presents to hospital with low back pain and fevers.  1.  Lumbar L5-S1 osteomyelitis/discitis-MRI from December 2019 also showed osteomyelitis and phlegmon at that region.  But since she had multiple procedures done at the time, she was discharged with outpatient follow-up was supposed to be on doxycycline prophylaxis which she did not take for the last 2 weeks according to patient. - she had MRSA, serratia and enterococcus bacteremia at that time -Admitted with fevers and chills, cultures are pending -Appreciate ID consult.  Requested IR guided biopsy for cultures, however not able to be done until Tuesday of next week. -Discussed with neurosurgery-they have clearly mentioned that they will not be doing any surgical procedures at this time. -No psoas abscess noted. -On broad-spectrum antibiotics   2.  Pain control-pain control has been an issue due to her high threshold for pain meds -Started on methadone.  She was discharged on Suboxone and no opioids while in December.  However patient continues to use IV opioids as outpatient. -Continue to monitor Norco changed to Percocet today  3.  Anxiety and withdrawal from benzos-started on Klonopin   4.  Pulmonary opacities-patient did have septic pulmonary emboli in December.  Status post tricuspid valve surgery and repeat bacteremia.  However was not a candidate for repeat surgery -Chest x-ray ordered, however patient has been refusing x-rays scans at this time -Finally had a chest x-ray done which was clear  Patient undergoing active treatment with IV antibiotics, pain control. Patient was  found with syringe in her hand with unknown fluid drawn after having a  visitor.  Quested patient not use drugs during the hospital stay.  Patient extremely restless and agitated.  Decided to leave AGAINST MEDICAL ADVICE.  Counseled extensively to not leave.  Was alert and oriented.  Left AGAINST MEDICAL ADVICE.  CONSULTS OBTAINED:  Treatment Team:  Lynn Itoavishankar, Jayashree, MD Venetia NightYarbrough, Chester, MD  DRUG ALLERGIES:  No Known Allergies  DISCHARGE MEDICATIONS:   Allergies as of 06/18/2018   No Known Allergies     Medication List    TAKE these medications   sulfamethoxazole-trimethoprim 800-160 MG tablet Commonly known as:  BACTRIM DS,SEPTRA DS Take 1 tablet by mouth 2 (two) times daily.       Today   VITAL SIGNS:  Blood pressure 103/60, pulse 87, temperature 98 F (36.7 C), temperature source Oral, resp. rate 14, height 5\' 2"  (1.575 m), weight 51.3 kg, SpO2 100 %.  I/O:  No intake or output data in the 24 hours ending 06/20/18 1348  PHYSICAL EXAMINATION:  Physical Exam  GENERAL:  [age] y.o.-year-old patient lying in the bed with no acute distress.  LUNGS: Normal breath sounds bilaterally, no wheezing, rales,rhonchi or crepitation. No use of accessory muscles of respiration.  CARDIOVASCULAR: S1, S2 normal. No murmurs, rubs, or gallops.  ABDOMEN: Soft, non-tender, non-distended. Bowel sounds present. No organomegaly or mass.  NEUROLOGIC: Moves all 4 extremities. PSYCHIATRIC: The patient is alert and oriented x 3.  SKIN: No obvious rash, lesion, or ulcer.   DATA REVIEW:   CBC Recent Labs  Lab 06/18/18 0557  WBC 9.0  HGB 9.0*  HCT 29.9*  PLT 291    Chemistries  Recent Labs  Lab 06/15/18 2158  06/18/18 0557  NA 133*   < > 141  K 3.9   < > 3.7  CL 102   < > 107  CO2 24   < > 28  GLUCOSE 112*   < > 102*  BUN 16   < > 19  CREATININE 0.89   < > 0.83  CALCIUM 9.1   < > 8.8*  AST 11*  --   --   ALT 8  --   --   ALKPHOS 62  --   --   BILITOT 1.0  --   --    < > = values in this interval not displayed.    Cardiac Enzymes No  results for input(s): TROPONINI in the last 168 hours.  Microbiology Results  Results for orders placed or performed during the hospital encounter of 06/15/18  Culture, blood (Routine x 2)     Status: None   Collection Time: 06/15/18  9:59 PM  Result Value Ref Range Status   Specimen Description BLOOD BLOOD LEFT FOREARM  Final   Special Requests   Final    BOTTLES DRAWN AEROBIC AND ANAEROBIC Blood Culture adequate volume   Culture   Final    NO GROWTH 5 DAYS Performed at [facility], [address] [place], [place] [address]    Report Status 06/20/2018 FINAL  Final  Culture, blood (Routine x 2)     Status: None   Collection Time: 06/15/18  9:59 PM  Result Value Ref Range Status   Specimen Description BLOOD BLOOD RIGHT FOREARM  Final   Special Requests   Final    BOTTLES DRAWN AEROBIC AND ANAEROBIC Blood Culture adequate volume   Culture   Final    NO GROWTH 5 DAYS Performed at  [facility], [address] [place], [place] [address]    Report Status 06/20/2018 FINAL  Final  MRSA PCR Screening     Status: None   Collection Time: 06/16/18  5:38 PM  Result Value Ref Range Status   MRSA by PCR NEGATIVE NEGATIVE Final    Comment:        The GeneXpert MRSA Assay (FDA approved for NASAL specimens only), is one component of a comprehensive MRSA colonization surveillance program. It is not intended to diagnose MRSA infection nor to guide or monitor treatment for MRSA infections. Performed at [facility], [address], [place], [place] [address]     RADIOLOGY:  No results found.  Follow up with PCP in 1 week.  Management plans discussed with the patient, family and they are in agreement.  CODE STATUS:  Code Status History    Date Active Date Inactive Code Status Order ID Comments User Context   06/16/2018 1249 06/18/2018 1720 Full Code [id number]  Bertrum Sol, MD ED   03/04/2018 0243 03/05/2018 0312 Full Code [id number]  Oralia Manis,  MD Inpatient   12/28/2017 1331 01/11/2018 2106 Full Code [id number]  Adrian Saran, MD Inpatient   09/20/2017 2027 09/24/2017 1255 Full Code [id number]  Rometta Emery, MD ED   11/13/2015 0614 11/20/2015 1845 Full Code [id number]  Ihor Austin, MD ED   04/28/2013 0742 04/29/2013 1615 Full Code [id number]  Almond Lint, MD Inpatient      TOTAL TIME TAKING CARE OF THIS PATIENT ON DAY OF DISCHARGE: more than 30 minutes.   Molinda Bailiff Chantay Whitelock M.D on 06/20/2018 at 1:48 PM  Between 7am to 6pm - Pager - [phone]  After 6pm go to [email or web address] - password [email or web address] [facility]  [facility]  Office  [phone]  CC: Primary care physician; Alberteen Spindle, CNM  Note: This dictation was prepared with Dragon dictation along with smaller phrase technology. Any transcriptional errors that result from this process are unintentional.

## 2019-08-30 ENCOUNTER — Emergency Department: Payer: Self-pay

## 2019-08-30 ENCOUNTER — Other Ambulatory Visit: Payer: Self-pay

## 2019-08-30 ENCOUNTER — Inpatient Hospital Stay
Admission: EM | Admit: 2019-08-30 | Discharge: 2019-09-01 | DRG: 871 | Disposition: A | Discharge status: Other Acute Inpatient Hospital | Payer: Self-pay | Attending: Internal Medicine | Admitting: Internal Medicine

## 2019-08-30 DIAGNOSIS — F17210 Nicotine dependence, cigarettes, uncomplicated: Secondary | ICD-10-CM | POA: Diagnosis present

## 2019-08-30 DIAGNOSIS — I330 Acute and subacute infective endocarditis: Secondary | ICD-10-CM | POA: Diagnosis present

## 2019-08-30 DIAGNOSIS — F1123 Opioid dependence with withdrawal: Secondary | ICD-10-CM | POA: Diagnosis present

## 2019-08-30 DIAGNOSIS — Z981 Arthrodesis status: Secondary | ICD-10-CM

## 2019-08-30 DIAGNOSIS — R6521 Severe sepsis with septic shock: Secondary | ICD-10-CM

## 2019-08-30 DIAGNOSIS — M1990 Unspecified osteoarthritis, unspecified site: Secondary | ICD-10-CM | POA: Diagnosis present

## 2019-08-30 DIAGNOSIS — R6520 Severe sepsis without septic shock: Secondary | ICD-10-CM | POA: Diagnosis present

## 2019-08-30 DIAGNOSIS — Z9119 Patient's noncompliance with other medical treatment and regimen: Secondary | ICD-10-CM

## 2019-08-30 DIAGNOSIS — J45909 Unspecified asthma, uncomplicated: Secondary | ICD-10-CM | POA: Diagnosis present

## 2019-08-30 DIAGNOSIS — Z6821 Body mass index (BMI) 21.0-21.9, adult: Secondary | ICD-10-CM

## 2019-08-30 DIAGNOSIS — F1110 Opioid abuse, uncomplicated: Secondary | ICD-10-CM | POA: Diagnosis present

## 2019-08-30 DIAGNOSIS — Z681 Body mass index (BMI) 19 or less, adult: Secondary | ICD-10-CM

## 2019-08-30 DIAGNOSIS — M4622 Osteomyelitis of vertebra, cervical region: Secondary | ICD-10-CM | POA: Diagnosis present

## 2019-08-30 DIAGNOSIS — M4626 Osteomyelitis of vertebra, lumbar region: Secondary | ICD-10-CM | POA: Diagnosis present

## 2019-08-30 DIAGNOSIS — M4647 Discitis, unspecified, lumbosacral region: Secondary | ICD-10-CM | POA: Diagnosis present

## 2019-08-30 DIAGNOSIS — R571 Hypovolemic shock: Secondary | ICD-10-CM | POA: Diagnosis present

## 2019-08-30 DIAGNOSIS — A4102 Sepsis due to Methicillin resistant Staphylococcus aureus: Principal | ICD-10-CM | POA: Diagnosis present

## 2019-08-30 DIAGNOSIS — Z20822 Contact with and (suspected) exposure to covid-19: Secondary | ICD-10-CM | POA: Diagnosis present

## 2019-08-30 DIAGNOSIS — I76 Septic arterial embolism: Secondary | ICD-10-CM | POA: Diagnosis present

## 2019-08-30 DIAGNOSIS — Z9114 Patient's other noncompliance with medication regimen: Secondary | ICD-10-CM

## 2019-08-30 DIAGNOSIS — J189 Pneumonia, unspecified organism: Secondary | ICD-10-CM | POA: Diagnosis present

## 2019-08-30 DIAGNOSIS — E43 Unspecified severe protein-calorie malnutrition: Secondary | ICD-10-CM | POA: Diagnosis present

## 2019-08-30 DIAGNOSIS — F1193 Opioid use, unspecified with withdrawal: Secondary | ICD-10-CM | POA: Diagnosis present

## 2019-08-30 DIAGNOSIS — A419 Sepsis, unspecified organism: Secondary | ICD-10-CM

## 2019-08-30 DIAGNOSIS — D649 Anemia, unspecified: Secondary | ICD-10-CM | POA: Diagnosis present

## 2019-08-30 DIAGNOSIS — B1920 Unspecified viral hepatitis C without hepatic coma: Secondary | ICD-10-CM | POA: Diagnosis present

## 2019-08-30 DIAGNOSIS — I2690 Septic pulmonary embolism without acute cor pulmonale: Secondary | ICD-10-CM | POA: Diagnosis present

## 2019-08-30 DIAGNOSIS — F1410 Cocaine abuse, uncomplicated: Secondary | ICD-10-CM | POA: Diagnosis present

## 2019-08-30 DIAGNOSIS — J9601 Acute respiratory failure with hypoxia: Secondary | ICD-10-CM | POA: Diagnosis present

## 2019-08-30 DIAGNOSIS — A4101 Sepsis due to Methicillin susceptible Staphylococcus aureus: Secondary | ICD-10-CM

## 2019-08-30 LAB — RESPIRATORY PANEL BY RT PCR (FLU A&B, COVID)
Influenza A by PCR: NEGATIVE
Influenza B by PCR: NEGATIVE
SARS Coronavirus 2 by RT PCR: NEGATIVE

## 2019-08-30 LAB — CBC WITH DIFFERENTIAL/PLATELET
Abs Immature Granulocytes: 0.76 10*3/uL — ABNORMAL HIGH (ref 0.00–0.07)
Basophils Absolute: 0.1 10*3/uL (ref 0.0–0.1)
Basophils Relative: 0 %
Eosinophils Absolute: 0 10*3/uL (ref 0.0–0.5)
Eosinophils Relative: 0 %
HCT: 15.2 % — ABNORMAL LOW (ref 36.0–46.0)
Hemoglobin: 5.1 g/dL — ABNORMAL LOW (ref 12.0–15.0)
Immature Granulocytes: 3 %
Lymphocytes Relative: 9 %
Lymphs Abs: 2.4 10*3/uL (ref 0.7–4.0)
MCH: 22.7 pg — ABNORMAL LOW (ref 26.0–34.0)
MCHC: 33.6 g/dL (ref 30.0–36.0)
MCV: 67.6 fL — ABNORMAL LOW (ref 80.0–100.0)
Monocytes Absolute: 1.4 10*3/uL — ABNORMAL HIGH (ref 0.1–1.0)
Monocytes Relative: 5 %
Neutro Abs: 21.9 10*3/uL — ABNORMAL HIGH (ref 1.7–7.7)
Neutrophils Relative %: 83 %
Platelets: 268 10*3/uL (ref 150–400)
RBC: 2.25 MIL/uL — ABNORMAL LOW (ref 3.87–5.11)
RDW: 17.2 % — ABNORMAL HIGH (ref 11.5–15.5)
WBC: 26.6 10*3/uL — ABNORMAL HIGH (ref 4.0–10.5)
nRBC: 0 % (ref 0.0–0.2)

## 2019-08-30 LAB — BLOOD GAS, VENOUS
Acid-base deficit: 2.3 mmol/L — ABNORMAL HIGH (ref 0.0–2.0)
Bicarbonate: 21.4 mmol/L (ref 20.0–28.0)
O2 Saturation: 65.1 %
Patient temperature: 37
pCO2, Ven: 33 mmHg — ABNORMAL LOW (ref 44.0–60.0)
pH, Ven: 7.42 (ref 7.250–7.430)
pO2, Ven: 33 mmHg (ref 32.0–45.0)

## 2019-08-30 LAB — URINALYSIS, COMPLETE (UACMP) WITH MICROSCOPIC
Bilirubin Urine: NEGATIVE
Glucose, UA: NEGATIVE mg/dL
Ketones, ur: NEGATIVE mg/dL
Nitrite: NEGATIVE
Protein, ur: 100 mg/dL — AB
Specific Gravity, Urine: 1.01 (ref 1.005–1.030)
pH: 6 (ref 5.0–8.0)

## 2019-08-30 LAB — COMPREHENSIVE METABOLIC PANEL
ALT: 16 U/L (ref 0–44)
AST: 37 U/L (ref 15–41)
Albumin: 1.9 g/dL — ABNORMAL LOW (ref 3.5–5.0)
Alkaline Phosphatase: 91 U/L (ref 38–126)
Anion gap: 12 (ref 5–15)
BUN: 22 mg/dL — ABNORMAL HIGH (ref 6–20)
CO2: 21 mmol/L — ABNORMAL LOW (ref 22–32)
Calcium: 7.4 mg/dL — ABNORMAL LOW (ref 8.9–10.3)
Chloride: 92 mmol/L — ABNORMAL LOW (ref 98–111)
Creatinine, Ser: 0.91 mg/dL (ref 0.44–1.00)
GFR calc Af Amer: >60 mL/min (ref >60)
GFR calc non Af Amer: >60 mL/min (ref >60)
Glucose, Bld: 104 mg/dL — ABNORMAL HIGH (ref 70–99)
Potassium: 3.8 mmol/L (ref 3.5–5.1)
Sodium: 125 mmol/L — ABNORMAL LOW (ref 135–145)
Total Bilirubin: 0.4 mg/dL (ref 0.3–1.2)
Total Protein: 6.7 g/dL (ref 6.5–8.1)

## 2019-08-30 LAB — PROTIME-INR
INR: 1.4 — ABNORMAL HIGH (ref 0.8–1.2)
Prothrombin Time: 16.2 seconds — ABNORMAL HIGH (ref 11.4–15.2)

## 2019-08-30 LAB — PREGNANCY, URINE: Preg Test, Ur: NEGATIVE

## 2019-08-30 LAB — PHOSPHORUS: Phosphorus: 3.4 mg/dL (ref 2.5–4.6)

## 2019-08-30 LAB — MAGNESIUM: Magnesium: 2 mg/dL (ref 1.7–2.4)

## 2019-08-30 LAB — GLUCOSE, CAPILLARY: Glucose-Capillary: 101 mg/dL — ABNORMAL HIGH (ref 70–99)

## 2019-08-30 LAB — ETHANOL: Alcohol, Ethyl (B): <10 mg/dL (ref <10)

## 2019-08-30 LAB — PROCALCITONIN: Procalcitonin: 7.36 ng/mL

## 2019-08-30 LAB — LACTIC ACID, PLASMA
Lactic Acid, Venous: 2.7 mmol/L (ref 0.5–1.9)
Lactic Acid, Venous: 1.4 mmol/L (ref 0.5–1.9)

## 2019-08-30 LAB — ABO/RH: ABO/RH(D): O POS

## 2019-08-30 LAB — APTT: aPTT: 33 seconds (ref 24–36)

## 2019-08-30 MED ORDER — SODIUM CHLORIDE 0.9 % IV SOLN
2.0000 g | Freq: Once | INTRAVENOUS | Status: AC
  Administered 2019-08-30: 2 g via INTRAVENOUS
  Filled 2019-08-30: qty 2

## 2019-08-30 MED ORDER — ACETAMINOPHEN 325 MG PO TABS
650.0000 mg | ORAL_TABLET | Freq: Once | ORAL | Status: AC
  Administered 2019-08-30: 650 mg via ORAL
  Filled 2019-08-30: qty 2

## 2019-08-30 MED ORDER — SODIUM CHLORIDE 0.9 % IV BOLUS
500.0000 mL | Freq: Once | INTRAVENOUS | Status: AC
  Administered 2019-08-30: 500 mL via INTRAVENOUS

## 2019-08-30 MED ORDER — SODIUM CHLORIDE 0.9 % IV SOLN: Freq: Once | INTRAVENOUS | Status: AC

## 2019-08-30 MED ORDER — VANCOMYCIN HCL IN DEXTROSE 1-5 GM/200ML-% IV SOLN
1000.0000 mg | Freq: Once | INTRAVENOUS | Status: AC
  Administered 2019-08-30: 1000 mg via INTRAVENOUS
  Filled 2019-08-30: qty 200

## 2019-08-30 MED ORDER — OXYCODONE HCL 5 MG PO TABS
10.0000 mg | ORAL_TABLET | ORAL | Status: DC | PRN
  Administered 2019-08-31 – 2019-09-01 (×6): 10 mg via ORAL
  Filled 2019-08-30 (×7): qty 2

## 2019-08-30 MED ORDER — LACTATED RINGERS IV BOLUS (SEPSIS): 1000.0000 mL | Freq: Once | INTRAVENOUS | Status: DC

## 2019-08-30 MED ORDER — MORPHINE SULFATE (PF) 4 MG/ML IV SOLN
4.0000 mg | INTRAVENOUS | Status: DC | PRN
  Administered 2019-08-30: 4 mg via INTRAVENOUS
  Filled 2019-08-30 (×2): qty 1

## 2019-08-30 MED ORDER — METRONIDAZOLE IN NACL 5-0.79 MG/ML-% IV SOLN
500.0000 mg | Freq: Once | INTRAVENOUS | Status: AC
  Administered 2019-08-30: 500 mg via INTRAVENOUS
  Filled 2019-08-30: qty 100

## 2019-08-30 MED ORDER — PROMETHAZINE HCL 25 MG/ML IJ SOLN
25.0000 mg | Freq: Four times a day (QID) | INTRAMUSCULAR | Status: DC | PRN
  Administered 2019-08-31 – 2019-09-01 (×2): 25 mg via INTRAVENOUS
  Filled 2019-08-30 (×3): qty 1

## 2019-08-30 MED ORDER — NOREPINEPHRINE 4 MG/250ML-% IV SOLN
2.0000 ug/min | INTRAVENOUS | Status: DC
  Administered 2019-08-30: 2 ug/min via INTRAVENOUS
  Filled 2019-08-30: qty 250

## 2019-08-30 MED ORDER — LACTATED RINGERS IV BOLUS (SEPSIS): 500.0000 mL | Freq: Once | INTRAVENOUS | Status: DC

## 2019-08-30 MED ORDER — ACETAMINOPHEN 500 MG PO TABS
1000.0000 mg | ORAL_TABLET | Freq: Once | ORAL | Status: AC
  Administered 2019-08-30: 1000 mg via ORAL
  Filled 2019-08-30: qty 2

## 2019-08-30 MED ORDER — DOCUSATE SODIUM 100 MG PO CAPS: 100.0000 mg | ORAL_CAPSULE | Freq: Two times a day (BID) | ORAL | Status: DC | PRN

## 2019-08-30 MED ORDER — SODIUM CHLORIDE 0.9 % IV SOLN: 10.0000 mL/h | Freq: Once | INTRAVENOUS | Status: DC

## 2019-08-30 MED ORDER — SODIUM CHLORIDE 0.9 % IV BOLUS
1000.0000 mL | Freq: Once | INTRAVENOUS | Status: AC
  Administered 2019-08-30: 1000 mL via INTRAVENOUS

## 2019-08-30 MED ORDER — SODIUM BICARBONATE 8.4 % IV SOLN
100.0000 meq | Freq: Once | INTRAVENOUS | Status: AC
  Administered 2019-08-31: 100 meq via INTRAVENOUS
  Filled 2019-08-30: qty 100

## 2019-08-30 MED ORDER — IPRATROPIUM-ALBUTEROL 0.5-2.5 (3) MG/3ML IN SOLN: 3.0000 mL | Freq: Four times a day (QID) | RESPIRATORY_TRACT | Status: DC | PRN

## 2019-08-30 MED ORDER — POLYETHYLENE GLYCOL 3350 17 G PO PACK: 17.0000 g | PACK | Freq: Every day | ORAL | Status: DC | PRN

## 2019-08-30 MED ORDER — DIPHENHYDRAMINE HCL 50 MG/ML IJ SOLN
25.0000 mg | Freq: Once | INTRAMUSCULAR | Status: AC
  Administered 2019-08-30: 25 mg via INTRAVENOUS
  Filled 2019-08-30: qty 1

## 2019-08-30 MED ORDER — SODIUM CHLORIDE 0.9 % IV SOLN: 250.0000 mL | INTRAVENOUS | Status: DC

## 2019-08-30 MED ORDER — MORPHINE SULFATE (PF) 2 MG/ML IV SOLN
2.0000 mg | INTRAVENOUS | Status: DC | PRN
  Administered 2019-08-30 – 2019-08-31 (×3): 4 mg via INTRAVENOUS
  Administered 2019-08-31: 2 mg via INTRAVENOUS
  Administered 2019-08-31 – 2019-09-01 (×4): 4 mg via INTRAVENOUS
  Administered 2019-09-01: 2 mg via INTRAVENOUS
  Administered 2019-09-01: 4 mg via INTRAVENOUS
  Filled 2019-08-30 (×2): qty 2
  Filled 2019-08-30: qty 1
  Filled 2019-08-30 (×5): qty 2
  Filled 2019-08-30: qty 1

## 2019-08-30 MED ORDER — CHLORHEXIDINE GLUCONATE CLOTH 2 % EX PADS
6.0000 | MEDICATED_PAD | Freq: Every day | CUTANEOUS | Status: DC
  Administered 2019-08-30 – 2019-09-01 (×3): 6 via TOPICAL
  Filled 2019-08-30 (×2): qty 6

## 2019-08-30 MED ORDER — ONDANSETRON HCL 4 MG/2ML IJ SOLN: 4.0000 mg | Freq: Four times a day (QID) | INTRAMUSCULAR | Status: DC | PRN

## 2019-08-30 MED ORDER — LIDOCAINE HCL (PF) 1 % IJ SOLN
5.0000 mL | Freq: Once | INTRAMUSCULAR | Status: DC
  Filled 2019-08-30: qty 5

## 2019-08-30 MED ORDER — SODIUM CHLORIDE 0.9 % IV SOLN: INTRAVENOUS | Status: DC

## 2019-08-30 NOTE — ED Notes (Signed)
IV access and blood draw attempted by Amy, RN without success, IV team consult placed

## 2019-08-30 NOTE — ED Notes (Signed)
Verbal order given by Dr. Roxan Hockey to change LR that is ordered to NS 1500cc bolus IV

## 2019-08-30 NOTE — Plan of Care (Addendum)
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  Pt was admitted to [facility] earlier this month found to have MSSA bacteremia, multifocal PNA likely septic emboli, tricuspid valve endocarditis.  ABx switched to nafcillin.  But before CVTS consult could be obtained pt left AMA.  Patient with extensive history of continuing to leave AMA despite sepsis admissions.  Now presents to [facility] with severe sepsis, HGB 5.1.  [facility] doc will call CVTS; however, did warn [facility] doc that [facility] was [facility] (looks like 31 holding in ED at the moment).  And suggested he try to admit elsewhere for CVTS availability due to lack of beds here at [facility]; alternatively could consider trying to admit to [facility] to stabilize then do an IP transfer later on.

## 2019-08-30 NOTE — Progress Notes (Signed)
PHARMACY -  BRIEF ANTIBIOTIC NOTE   Pharmacy has received consult(s) for vancomycin and cefepime from an ED provider.  The patient's profile has been reviewed for ht/wt/allergies/indication/available labs.    One time order(s) placed for vanc 1 g + cefepime 2 g  Further antibiotics/pharmacy consults should be ordered by admitting physician if indicated.                       Thank you,  Pricilla Riffle, PharmD 08/30/2019  6:05 PM

## 2019-08-30 NOTE — ED Notes (Signed)
Pt sweating when this RN enters room. This RN removes blanket that pt has over head. A opened needle hits this RN in arm and falls to floor. Needle did not penetrate this RNs skin. Needle has clear substance inside. Pt also has multiple drink bottle caps in groin and lap with soaked fabric inside with unknown residue. Pt sts, "I don't know what that is. That's not mine." MD called to bedside.

## 2019-08-30 NOTE — ED Notes (Signed)
Pt now under IVC. Pts personal belongings placed into bag and labeled. Pt changed into hospital gown and brief. [employer] called for unknown substance removal.

## 2019-08-30 NOTE — H&P (Addendum)
Name: Andrea King MRN: [id number] DOB: [birth year]    ADMISSION DATE:  08/30/2019 CONSULTATION DATE: 08/30/2019  REFERRING MD : Dr. Roxan Hockey   CHIEF COMPLAINT: Fever   BRIEF PATIENT DESCRIPTION:  44 yo female with hx of IVDU and opiate drug use along with cocaine abuse admitted with severe anemia without evidence of active bleeding, septic/hypovolemia shock secondary to recent MSSA bacteremia dx, multifocal pneumonia likely septic emboli, and endocarditis following leaving [facility] AMA and prescribed abx noncompliance   SIGNIFICANT EVENTS/STUDIES:  04/28: Pt admitted to ICU on levophed gtt   HISTORY OF PRESENT ILLNESS:   This is a 44 yo female with a PMH Frequently Leaves Hospital AMA, Polymicrobial Bacteremia (MRSA, Serratia, and Enterococcus), Discitis, Epidural Abscess, Spinal Hardware Infection, IV Drug User (heroin), Hepatitis C, Endocarditis of Tricuspid Valve s/p Repair (2017) secondary to IV Drug Use and Severe Opiate Drug Use Along with Cocaine Abuse, Cervical Osteomyelitis (with complicated admission 03/2018-12-/2019 discharged on doxycycline indefinitely), Acute Kidney Injury, Septic Arthritis, Febrile Nonhemolytic Transfusion Reaction, Anxiety, Depression, Acute Osteomyelitis of Lumbar Spine, and Severe Protein-Calorie Malnutrition. She was admitted to [facility] on 08/05/2019 with diagnosis of sepsis secondary to MSSA bacteremia and started on broad spectrum abx, TTE negative for valvular vegetations, TEE without valvular vegetations however there was evidence of right intra atrial vegetation.  Unfortunately the pt left AMA before CT surgery could be consulted, she was discharged on oral doxycycline 100 mg bid but advised this was not sufficient treatment for endocarditis and still left AMA.   During current ER presentation she presented to [facility] ER on 04/28 with fever, malaise, and stating "I have infection everywhere." Per ER notes the pt stated she has not been taking  her antibiotics as prescribed due to financial reasons.  Lab results revealed Na+ 125, CO2 21, glucose 104, BUN 22, calcium 7.4, albumin 1.9, lactic acid 2.7, wbc 26.6, hgb 5.1, pt 16.2, inr 1.4, and pct 7.36.  COVID-19/ Influenza PCR negative and UA revealed possible UTI. CXR concerning multifocal pulmonary opacities, which appear cavitary consistent with septic emboli in the setting of recent endocarditis. She received flagyl, cefepime, vancomycin, 2.5L NS bolus and 1 unit of pRBC's ordered.  ER physician attempted to transfer pt to [place], [place], and [place] however no beds were available.  ER physician discussed pts case in consultation with Cardiothoracic Surgeon Dr. Hortencia Pilar who recommended [facility] admission for medical management for now, and pt transfer could be considered if pt develops signs of worsening valve failure. Unfortunately while undergoing treatment in the ER pts RN found multiple syringes with a dark substance and caps hidden in her bilateral groins.  Upon further evaluation ER physician concerned due to pts chronic encephalopathy she could not make appropriate decisions regarding her care, and pt continued to threaten she would leave against medical advise.  Therefore, ER physician involuntarily committed Andrea King. ER physician subsequently contacted PCCM for ICU admission and due to continued hypotension peripheral levophed gtt initiated.    PAST MEDICAL HISTORY :   has a past medical history of Anemia, Asthma, DJD (degenerative joint disease), Hepatitis C, and Substance abuse (HCC).  has a past surgical history that includes Tumor excision; Inguinal hernia repair (Left, 04/28/2013); TEE without cardioversion (N/A, 11/18/2015); TEE without cardioversion (N/A, 12/31/2017); and TEE without cardioversion (N/A, 01/04/2018). Prior to Admission medications   Not on File   No Known Allergies  FAMILY HISTORY:  family history includes Cancer in her mother; Cancer - Colon in her maternal  grandfather; Diabetes in her maternal grandfather. SOCIAL HISTORY:  reports that she has been smoking cigarettes. She has never used smokeless tobacco. She reports current drug use. She reports that she does not drink alcohol.  REVIEW OF SYSTEMS: Positives in BOLD  Constitutional: fever, chills, weight loss, malaise/fatigue and diaphoresis.  HENT: Negative for hearing loss, ear pain, nosebleeds, congestion, sore throat, neck pain, tinnitus and ear discharge.   Eyes: Negative for blurred vision, double vision, photophobia, pain, discharge and redness.  Respiratory: cough, hemoptysis, sputum production, intermittent shortness of breath, wheezing and stridor.   Cardiovascular: chest pain, palpitations, orthopnea, claudication, leg swelling and PND.  Gastrointestinal: Negative for heartburn, nausea, vomiting, abdominal pain, diarrhea, constipation, blood in stool and melena.  Genitourinary: Negative for dysuria, urgency, frequency, hematuria and flank pain.  Musculoskeletal: Negative for myalgias, back pain, joint pain and falls.  Skin: Negative for itching and rash.  Neurological: Negative for dizziness, tingling, tremors, sensory change, speech change, focal weakness, seizures, loss of consciousness, weakness and headaches.  Endo/Heme/Allergies: Negative for environmental allergies and polydipsia. Does not bruise/bleed easily.  SUBJECTIVE:  Pt stating "I have infection everywhere and I hurt everywhere"  VITAL SIGNS: Temp:  [101.5 F (38.6 C)] 101.5 F (38.6 C) (04/28 1742) Pulse Rate:  [88-130] 88 (04/28 2200) Resp:  [18-29] 29 (04/28 2200) BP: (86-112)/(46-55) 86/46 (04/28 2200) SpO2:  [88 %-96 %] 96 % (04/28 2200) Weight:  [45.4 kg] 45.4 kg (04/28 1743)  PHYSICAL EXAMINATION: General: chronically ill appearing, cachetic female, resting in bed NAD  Neuro: alert and oriented, follows commands, PERRLA  HEENT: supple, no JVD  Cardiovascular: sinus rhythm, rrr, no R/G Lungs: rhonchi  throughout, even, non labored  Abdomen: +BS x4, non distended, slightly tender Musculoskeletal: severely cachetic, moves all extremities   Skin: track marks on bilateral upper extremities   Recent Labs  Lab 08/30/19 1829  NA 125*  K 3.8  CL 92*  CO2 21*  BUN 22*  CREATININE 0.91  GLUCOSE 104*   Recent Labs  Lab 08/30/19 1829  HGB 5.1*  HCT 15.2*  WBC 26.6*  PLT 268   DG Chest 1 View  Result Date: 08/30/2019 CLINICAL DATA:  Sepsis. Patient states infection in arm, hand, and endocarditis. Recent hospitalization for endocarditis. EXAM: CHEST  1 VIEW COMPARISON:  Most recent radiograph 06/16/2018, no interval chest imaging. FINDINGS: Multifocal patchy and nodular opacities throughout both lungs, some of which appear cavitary. Post median sternotomy. Prosthetic cardiac valve. Heart is normal in size. Normal mediastinal contours. No pneumothorax or large pleural effusion. Multiple surgical clips project over the left hemithorax. Surgical hardware in the lower cervical spine is partially included. IMPRESSION: Multifocal pulmonary opacities, some of which appear cavitary, most consistent with septic emboli in the setting of recent endocarditis. Electronically Signed   By: Keith Rake M.D.   On: 08/30/2019 19:23    ASSESSMENT / PLAN:  Mild acute respiratory failure secondary to pneumonia and acid-base deficit  Supplemental O2 for dyspnea and/or hypoxia  Prn bronchodilator therapy  1 amp of sodium bicarb x1 dose  Intermittent CXR's   Hypotension secondary to septic and hypovolemic shock Right intra atrial vegetation Continuous telemetry monitoring  Aggressive fluid resuscitation and prn peripheral levophed gtt to maintain map >65 Echo pending  Will consult cardiology appreciate input  Leukocytosis secondary to recent MSSA bacteremia dx, multifocal pneumonia likely septic emboli, and endocarditis Trend WBC and monitor fever curve  Trend PCT  Follow cultures  Continue  cefepime and vancomycin  Will consult infectious  disease appreciate input   Anemia no obvious signs of active bleeding  VTE px: SCD's, avoid chemical prophylaxis  Trend CBC and will check fibrinogen level   Monitor for s/sx of bleeding and transfuse for hgb <7  IV Drug Use (heroin) and Severe Opiate Drug Use Along with Cocaine Abuse Urine drug screen and alcohol level results pending  Prn morphine and oxycodone for pain management; pt refused suboxone stated it makes her nauseated Pt involuntarily committed sitter to remain at bedside  Will consult psychiatry  Smoking and polysubstance abuse cessation counseling provided   Severe Protein-Calorie Malnutrition Will consult dietitian appreciate input   Sonda Rumble, AGNP  Pulmonary/Critical Care Pager [phone] (please enter 7 digits) PCCM Consult Pager [phone] (please enter 7 digits)

## 2019-08-30 NOTE — ED Triage Notes (Signed)
Pt arrives via [facility] from home for c/o "infection everywhere" PT states she has an infection in her arm, hand and endocarditis. PT tachycardic with EMS. PT moaning out for help while this RN completes triage. Pt reports she was recently hospitalized for endocarditis. PT 88% on RA, placed on 2 L [place] by this RN

## 2019-08-30 NOTE — Progress Notes (Signed)
PIV consult: Second set of blood cultures drawn after first antibiotic was infusing for several minutes. Unable to assess R arm due to pain. Pt stated it is "infected". Declined to roll up sleeve on R arm.

## 2019-08-30 NOTE — ED Notes (Signed)
Ok per Dr. Roxan Hockey to start abx with 1 blood culture collected

## 2019-08-30 NOTE — ED Notes (Signed)
IV team at bedside 

## 2019-08-30 NOTE — ED Provider Notes (Signed)
[facility] Emergency Department Provider Note    First MD Initiated Contact with Patient 08/30/19 1740     (approximate)  I have reviewed the triage vital signs and the nursing notes.   HISTORY  Chief Complaint Fever    HPI Andrea King is a 44 y.o. 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  She presents to the ER after leaving AMA from [facility] at the beginning of this month discharged home on antibiotics presenting for fever malaise" infection everywhere."   States that she did not take the antibiotics that she was prescribed after leaving AMA from [facility] because she "could not afford them "however she has used IV narcotic medication since then.  Says she lives at home.   Past Medical History:  Diagnosis Date  . Anemia   . Asthma   . DJD (degenerative joint disease)   . Hepatitis C   . Substance abuse (HCC)    Family History  Problem Relation Age of Onset  . Cancer Mother   . Cancer - Colon Maternal Grandfather   . Diabetes Maternal Grandfather    Past Surgical History:  Procedure Laterality Date  . INGUINAL HERNIA REPAIR Left 04/28/2013   Procedure: HERNIA REPAIR INGUINAL ADULT with mesh;  Surgeon: Cherylynn Ridges, MD;  Location: [facility] OR;  Service: General;  Laterality: Left;  . TEE WITHOUT CARDIOVERSION N/A 11/18/2015   Procedure: TRANSESOPHAGEAL ECHOCARDIOGRAM (TEE);  Surgeon: Iran Ouch, MD;  Location: [facility] ORS;  Service: Cardiovascular;  Laterality: N/A;  . TEE WITHOUT CARDIOVERSION N/A 12/31/2017   Procedure: TRANSESOPHAGEAL ECHOCARDIOGRAM (TEE);  Surgeon: Dalia Heading, MD;  Location: [facility] ORS;  Service: Cardiovascular;  Laterality: N/A;  . TEE WITHOUT CARDIOVERSION N/A 01/04/2018   Procedure: TRANSESOPHAGEAL ECHOCARDIOGRAM (TEE);  Surgeon: Dalia Heading, MD;  Location: [facility] ORS;  Service: Cardiovascular;  Laterality: N/A;  . TUMOR EXCISION     left scalpula   Patient Active Problem List   Diagnosis Date Noted  . 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 Malnutrition of moderate degree 12/29/2017  . 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 Palliative care by specialist   . Goals of care, counseling/discussion   . Periorbital cellulitis 09/20/2017  . Hypoglycemia 09/20/2017  . Bacteremia due to Staphylococcus   . Sepsis (HCC) 11/13/2015  . Community acquired pneumonia 11/13/2015  . Pneumonia 11/13/2015  . Substance induced mood disorder (HCC) 11/13/2015  . Opiate withdrawal (HCC) 11/13/2015  . Opiate abuse, continuous (HCC) 11/13/2015  . Incarcerated femoral hernia left 04/28/2013      Prior to Admission medications   Not on File    Allergies Patient has no known allergies.    Social History Social History   Tobacco Use  . Smoking status: Current Every Day Smoker    Types: Cigarettes  . Smokeless tobacco: Never Used  Substance Use Topics  . Alcohol use: No    Alcohol/week: 0.0 standard drinks  . Drug use: Yes    Comment: cocaine,Heroine    Review of Systems Patient denies headaches, rhinorrhea, blurry vision, numbness, shortness of breath, chest pain, edema, cough, abdominal pain, nausea, vomiting, diarrhea, dysuria, fevers, rashes or hallucinations unless otherwise stated above in HPI. ____________________________________________   PHYSICAL EXAM:  VITAL SIGNS: Vitals:   08/30/19 1742  BP: (!) 95/55  Pulse: (!) 130  Resp: 18  Temp: (!) 101.5 F (38.6 C)  SpO2: (!) 88%    Constitutional: Alert and oriented.  Disheveled and cachectic appearing.  She is critically ill with acute respiratory failure with hypoxia.  Diffuse rhonchi throughout lung fields.  Does have a systolic murmur Eyes: Conjunctivae are normal.  Head: Atraumatic. Nose: No congestion/rhinnorhea. Mouth/Throat: Mucous membranes are moist.     Neck: No stridor. Painless ROM.  Cardiovascular: Normal rate, regular rhythm. Grossly normal heart sounds.  Good peripheral circulation. Respiratory: Normal respiratory effort.  No retractions. Lungs CTAB. Gastrointestinal: Soft and nontender. No distention. No abdominal bruits. No CVA tenderness. Genitourinary: no cellulitis, abscess or masses noted Musculoskeletal: No lower extremity tenderness nor edema.  No joint effusions. Neurologic:  Normal speech and language. No gross focal neurologic deficits are appreciated. No facial droop Skin:  Skin is warm, dry and intact. No rash noted. Psychiatric: Mood and affect are anxious  ____________________________________________   LABS (all labs ordered are listed, but only abnormal results are displayed)  Results for orders placed or performed during the hospital encounter of 08/30/19 (from the past 24 hour(s))  Protime-INR     Status: Abnormal   Collection Time: 08/30/19  6:29 PM  Result Value Ref Range   Prothrombin Time 16.2 (H) 11.4 - 15.2 seconds   INR 1.4 (H) 0.8 - 1.2   ____________________________________________  EKG My review and personal interpretation at Time: 17:37   Indication: sepsis  Rate: 135  Rhythm: sinus Axis: normal Other: normal intervals, non specific st abn ____________________________________________  RADIOLOGY  I personally reviewed all radiographic images ordered to evaluate for the above acute complaints and reviewed radiology reports and findings.  These findings were personally discussed with the patient.  Please see medical record for radiology report.  ____________________________________________   PROCEDURES  Procedure(s) performed:  .Critical Care Performed by: Merlyn Lot, MD Authorized by: Merlyn Lot, MD   Critical care provider statement:    Critical care time (minutes):  50   Critical care time was exclusive of:  Separately billable procedures and treating other patients    Critical care was necessary to treat or prevent imminent or life-threatening deterioration of the following conditions:  Sepsis, respiratory failure and toxidrome   Critical care was time spent personally by me on the following activities:  Development of treatment plan with patient or surrogate, discussions with consultants, evaluation of patient's response to treatment, examination of patient, obtaining history from patient or surrogate, ordering and performing treatments and interventions, ordering and review of laboratory studies, ordering and review of radiographic studies, pulse oximetry, re-evaluation of patient's condition and review of old charts      Critical Care performed: yes ____________________________________________   INITIAL IMPRESSION / ASSESSMENT AND PLAN / ED COURSE  Pertinent labs & imaging results that were available during my care of the patient were reviewed by me and considered in my medical decision making (see chart for details).   DDX: Sepsis, bacteremia, endocarditis, osteo-, epidural abscess, discitis, UTI, pneumonia  Aydin L Makki is a 44 y.o. who presents to the ED with extensive past medical history.  On review of her medical record in chart this is quite a tragic case.  It appears that she has such a profound narcotic addiction that even after multiple hospitalizations for severe sepsis endocarditis epidural abscesses discitis etc. she continues to frequently leave AMA.  She arrives today after not being on antibiotics that were prescribed for recurrent sepsis.  She is ill-appearing.  She is hypoxic.  I suspect  septic multifocal pneumonia likely secondary to persistent endocarditis that is incompletely treated because the patient's noncompliance and nonadherence to medical recommendations.  Today she does not seem to be complaining of much back pain.  She does appear to be having withdrawal but is refusing Suboxone stating that it makes her nauseated.  Clinical  Course as of Aug 29 2137  Wed Aug 30, 2019  1931 Noted progressive worsening of white count.  Hemoglobin continues to downtrend.  Platelets are normal.  Will order blood transfusion.  Her INR is increasing concerning for worsening underlying renal failure.  Possible DIC though platelets are normal.  Albumin is significantly downtrending as well.  Sodium also decreased will continue with IV fluids.  Of treated fever.  She receiving broad-spectrum antibiotics which should cover her previous multiple positive culture results from previous admissions with most recent being [facility].  She has multiple cavitary pulmonary infiltrates consistent with septic emboli.  This would explain the patient's hypoxia.   [PR]  2007 Discussed case with Dr. Orvan Falconer hospitalist working at [facility] who has accepted patient but they currently having extensive waitlist.  Will reach out to [facility].   [PR]  2016 [facility] also on wait list for transfer at outside facilities. Waitlist for transfer at all 3 facilities.   [PR]  2112 [facility] states that they also do not have bed availability.   [PR]  2125 I discussed the case in consultation with Dr. Hortencia Pilar of cardiothoracic surgery who states that admission here for medical management will be appropriate and that transfer can be considered if she starts having signs of worsening valve failure.  Patient was just found to have multiple syringes with dark substance and caps.  States that she is "in pain ".  She clearly has some form of chronic substance abuse encephalopathy is not making appropriate decisions is clearly under the influence and now she is threatening to leave AMA.  I will IVC her as I do not believe she has capacity to make an informed decision.  Will discuss with hsopitalist for admission.   [PR]  2138 Discussed with the intensivist who currently excepted patient to the ICU.   [PR]    Clinical Course User Index [PR] Willy Eddy, MD    The patient was evaluated in  Emergency Department today for the symptoms described in the history of present illness. He/she was evaluated in the context of the global COVID-19 pandemic, which necessitated consideration that the patient might be at risk for infection with the SARS-CoV-2 virus that causes COVID-19. Institutional protocols and algorithms that pertain to the evaluation of patients at risk for COVID-19 are in a state of rapid change based on information released by regulatory bodies including the CDC and federal and state organizations. These policies and algorithms were followed during the patient's care in the ED.  As part of my medical decision making, I reviewed the following data within the electronic MEDICAL RECORD NUMBER Nursing notes reviewed and incorporated, Labs reviewed, notes from prior ED visits and [place] Controlled Substance Database   ____________________________________________   FINAL CLINICAL IMPRESSION(S) / ED DIAGNOSES  Final diagnoses:  Severe sepsis (HCC)  Acute respiratory failure with hypoxia (HCC)      NEW MEDICATIONS STARTED DURING THIS VISIT:  New Prescriptions   No medications on file     Note:  This document was prepared using Dragon voice recognition software and may include unintentional dictation errors.    Willy Eddy, MD 08/30/19 2140

## 2019-08-30 NOTE — Progress Notes (Signed)
CODE SEPSIS - PHARMACY COMMUNICATION  **Broad Spectrum Antibiotics should be administered within 1 hour of Sepsis diagnosis**  Time Code Sepsis Called/Page Received: 1803  Antibiotics Ordered: vancomycin/cefepime/metronidazole  Time of 1st antibiotic administration: 1841  Additional action taken by pharmacy: NA  If necessary, Name of Provider/Nurse Contacted: NA    Pricilla Riffle ,PharmD Clinical Pharmacist  08/30/2019  6:51 PM

## 2019-08-31 ENCOUNTER — Inpatient Hospital Stay
Admit: 2019-08-31 | Discharge: 2019-08-31 | Disposition: A | Discharge status: Home / Self Care | Payer: Self-pay | Attending: Critical Care Medicine | Admitting: Critical Care Medicine

## 2019-08-31 DIAGNOSIS — F17210 Nicotine dependence, cigarettes, uncomplicated: Secondary | ICD-10-CM

## 2019-08-31 DIAGNOSIS — I330 Acute and subacute infective endocarditis: Secondary | ICD-10-CM

## 2019-08-31 DIAGNOSIS — Z8619 Personal history of other infectious and parasitic diseases: Secondary | ICD-10-CM

## 2019-08-31 DIAGNOSIS — F1110 Opioid abuse, uncomplicated: Secondary | ICD-10-CM

## 2019-08-31 DIAGNOSIS — Z981 Arthrodesis status: Secondary | ICD-10-CM

## 2019-08-31 DIAGNOSIS — Z515 Encounter for palliative care: Secondary | ICD-10-CM

## 2019-08-31 DIAGNOSIS — Z8774 Personal history of (corrected) congenital malformations of heart and circulatory system: Secondary | ICD-10-CM

## 2019-08-31 DIAGNOSIS — M4622 Osteomyelitis of vertebra, cervical region: Secondary | ICD-10-CM

## 2019-08-31 DIAGNOSIS — Z952 Presence of prosthetic heart valve: Secondary | ICD-10-CM

## 2019-08-31 DIAGNOSIS — F1990 Other psychoactive substance use, unspecified, uncomplicated: Secondary | ICD-10-CM

## 2019-08-31 DIAGNOSIS — B9561 Methicillin susceptible Staphylococcus aureus infection as the cause of diseases classified elsewhere: Secondary | ICD-10-CM

## 2019-08-31 DIAGNOSIS — R7881 Bacteremia: Secondary | ICD-10-CM

## 2019-08-31 DIAGNOSIS — Z8614 Personal history of Methicillin resistant Staphylococcus aureus infection: Secondary | ICD-10-CM

## 2019-08-31 DIAGNOSIS — M4626 Osteomyelitis of vertebra, lumbar region: Secondary | ICD-10-CM

## 2019-08-31 DIAGNOSIS — E43 Unspecified severe protein-calorie malnutrition: Secondary | ICD-10-CM | POA: Insufficient documentation

## 2019-08-31 DIAGNOSIS — I2690 Septic pulmonary embolism without acute cor pulmonale: Secondary | ICD-10-CM

## 2019-08-31 LAB — MRSA PCR SCREENING: MRSA by PCR: NEGATIVE

## 2019-08-31 LAB — COMPREHENSIVE METABOLIC PANEL
ALT: 17 U/L (ref 0–44)
AST: 34 U/L (ref 15–41)
Albumin: 2 g/dL — ABNORMAL LOW (ref 3.5–5.0)
Alkaline Phosphatase: 104 U/L (ref 38–126)
Anion gap: 10 (ref 5–15)
BUN: 20 mg/dL (ref 6–20)
CO2: 21 mmol/L — ABNORMAL LOW (ref 22–32)
Calcium: 7.2 mg/dL — ABNORMAL LOW (ref 8.9–10.3)
Chloride: 104 mmol/L (ref 98–111)
Creatinine, Ser: 0.82 mg/dL (ref 0.44–1.00)
GFR calc Af Amer: >60 mL/min (ref >60)
GFR calc non Af Amer: >60 mL/min (ref >60)
Glucose, Bld: 105 mg/dL — ABNORMAL HIGH (ref 70–99)
Potassium: 4.2 mmol/L (ref 3.5–5.1)
Sodium: 135 mmol/L (ref 135–145)
Total Bilirubin: 0.5 mg/dL (ref 0.3–1.2)
Total Protein: 7.3 g/dL (ref 6.5–8.1)

## 2019-08-31 LAB — BLOOD CULTURE ID PANEL (REFLEXED)

## 2019-08-31 LAB — FIBRINOGEN: Fibrinogen: 685 mg/dL — ABNORMAL HIGH (ref 210–475)

## 2019-08-31 LAB — HEMOGLOBIN AND HEMATOCRIT, BLOOD
HCT: 21 % — ABNORMAL LOW (ref 36.0–46.0)
Hemoglobin: 7.1 g/dL — ABNORMAL LOW (ref 12.0–15.0)

## 2019-08-31 LAB — TROPONIN I (HIGH SENSITIVITY): Troponin I (High Sensitivity): 6 ng/L (ref <18)

## 2019-08-31 LAB — URINE CULTURE

## 2019-08-31 LAB — ECHOCARDIOGRAM COMPLETE
Height: 62 in
Weight: 1622.59 oz

## 2019-08-31 LAB — PROCALCITONIN: Procalcitonin: 6.53 ng/mL

## 2019-08-31 LAB — MAGNESIUM: Magnesium: 2 mg/dL (ref 1.7–2.4)

## 2019-08-31 LAB — APTT: aPTT: 34 seconds (ref 24–36)

## 2019-08-31 LAB — PREPARE RBC (CROSSMATCH)

## 2019-08-31 LAB — HIV ANTIBODY (ROUTINE TESTING W REFLEX): HIV Screen 4th Generation wRfx: NONREACTIVE

## 2019-08-31 LAB — PHOSPHORUS: Phosphorus: 2.7 mg/dL (ref 2.5–4.6)

## 2019-08-31 MED ORDER — SODIUM CHLORIDE 0.9 % IV SOLN
2.0000 g | Freq: Three times a day (TID) | INTRAVENOUS | Status: DC
  Administered 2019-08-31: 2 g via INTRAVENOUS
  Filled 2019-08-31 (×3): qty 2

## 2019-08-31 MED ORDER — ENOXAPARIN SODIUM 40 MG/0.4ML ~~LOC~~ SOLN
40.0000 mg | Freq: Every day | SUBCUTANEOUS | Status: DC
  Filled 2019-08-31: qty 0.4

## 2019-08-31 MED ORDER — CEFAZOLIN SODIUM-DEXTROSE 2-4 GM/100ML-% IV SOLN
2.0000 g | Freq: Three times a day (TID) | INTRAVENOUS | Status: DC
  Administered 2019-08-31 – 2019-09-01 (×5): 2 g via INTRAVENOUS
  Filled 2019-08-31 (×7): qty 100

## 2019-08-31 MED ORDER — SODIUM CHLORIDE 0.9 % IV BOLUS
1000.0000 mL | Freq: Once | INTRAVENOUS | Status: AC
  Administered 2019-08-31: 1000 mL via INTRAVENOUS

## 2019-08-31 MED ORDER — FLUCONAZOLE 100 MG PO TABS
100.0000 mg | ORAL_TABLET | Freq: Every day | ORAL | Status: DC
  Administered 2019-08-31 – 2019-09-01 (×2): 100 mg via ORAL
  Filled 2019-08-31 (×3): qty 1

## 2019-08-31 MED ORDER — ALPRAZOLAM 0.5 MG PO TABS
1.0000 mg | ORAL_TABLET | Freq: Three times a day (TID) | ORAL | Status: DC
  Administered 2019-08-31 – 2019-09-01 (×5): 1 mg via ORAL
  Filled 2019-08-31 (×5): qty 2

## 2019-08-31 MED ORDER — GABAPENTIN 100 MG PO CAPS
200.0000 mg | ORAL_CAPSULE | Freq: Once | ORAL | Status: AC
  Administered 2019-08-31: 200 mg via ORAL
  Filled 2019-08-31: qty 2

## 2019-08-31 MED ORDER — HYDROMORPHONE HCL 1 MG/ML IJ SOLN
1.0000 mg | Freq: Once | INTRAMUSCULAR | Status: AC
  Administered 2019-08-31: 1 mg via INTRAVENOUS
  Filled 2019-08-31: qty 1

## 2019-08-31 MED ORDER — ACETAMINOPHEN 325 MG PO TABS
650.0000 mg | ORAL_TABLET | Freq: Four times a day (QID) | ORAL | Status: DC | PRN
  Administered 2019-08-31 – 2019-09-01 (×2): 650 mg via ORAL
  Filled 2019-08-31 (×2): qty 2

## 2019-08-31 MED ORDER — VANCOMYCIN HCL IN DEXTROSE 1-5 GM/200ML-% IV SOLN
1000.0000 mg | INTRAVENOUS | Status: DC
  Filled 2019-08-31: qty 200

## 2019-08-31 MED ORDER — SIMETHICONE 80 MG PO CHEW
80.0000 mg | CHEWABLE_TABLET | Freq: Four times a day (QID) | ORAL | Status: DC | PRN
  Administered 2019-08-31 – 2019-09-01 (×2): 80 mg via ORAL
  Filled 2019-08-31 (×3): qty 1

## 2019-08-31 MED ORDER — ENSURE ENLIVE PO LIQD
237.0000 mL | Freq: Three times a day (TID) | ORAL | Status: DC
  Administered 2019-08-31: 237 mL via ORAL

## 2019-08-31 MED ORDER — LACTATED RINGERS IV BOLUS
1000.0000 mL | Freq: Once | INTRAVENOUS | Status: AC
  Administered 2019-08-31: 1000 mL via INTRAVENOUS

## 2019-08-31 NOTE — Progress Notes (Signed)
*  PRELIMINARY RESULTS* Echocardiogram 2D Echocardiogram has been performed.  Andrea King 08/31/2019, 9:38 AM

## 2019-08-31 NOTE — Progress Notes (Signed)
SYNOPSIS 44 yo female with hx of IVDU and opiate drug use along with cocaine abuse admitted with severe anemia without evidence of active bleeding, septic/hypovolemia shock secondary to recent MSSA bacteremia dx, multifocal pneumonia likely septic emboli, and endocarditis following leaving [facility] AMA and prescribed abx noncompliance   SIGNIFICANT EVENTS/STUDIES:  04/28: Pt admitted to ICU on levophed gtt  4/29 off vasopressors, minimal oxygen  CC  Follow up sepsis   HPI Alert and awake Off vasopressors HR elevated +agitation +pain      VITALS: BP 112/64   Pulse (!) 101   Temp (!) 97 F (36.1 C) (Oral)   Resp (!) 38   Ht 5\' 2"  (1.575 m)   Wt 46 kg   SpO2 100%   BMI 18.55 kg/m     I/O last 3 completed shifts: In: 618.3 [Blood:618.3] Out: -  No intake/output data recorded.  SpO2: 100 % O2 Flow Rate (L/min): 2 L/min  Estimated body mass index is 18.55 kg/m as calculated from the following:   Height as of this encounter: 5\' 2"  (1.575 m).   Weight as of this encounter: 46 kg.   Review of Systems:  Gen:  +sweats, +chills weigh loss HEENT: Denies blurred vision, double vision, ear pain, eye pain, hearing loss, nose bleeds, sore throat Cardiac:  No dizziness, chest pain or heaviness, chest tightness,edema, No JVD Resp:   No cough, -sputum production, -shortness of breath,-wheezing, -hemoptysis,  Gi: Denies swallowing difficulty, stomach pain, nausea or vomiting, diarrhea, constipation, bowel incontinence Other:  All other systems negative      Physical Examination:   General Appearance: No distress, cachexia, frail  Neuro:without focal findings,  speech normal,  HEENT: PERRLA, EOM intact.   Pulmonary: normal breath sounds, No wheezing.  CardiovascularNormal S1,S2.  No m/r/g.   Abdomen: Benign, Soft, non-tender. Renal:  No costovertebral tenderness  GU:  Not performed at this time. Endoc: No evident thyromegaly Skin:   warm, no rashes, no  ecchymosis  Extremities: normal, no cyanosis, clubbing. PSYCHIATRIC: Mood, affect within normal limits.     I personally reviewed Labs under Results section.   MEDICATIONS: I have reviewed all medications and confirmed regimen as documented   CULTURE RESULTS   Recent Results (from the past 240 hour(s))  Culture, blood (Routine x 2)     Status: None (Preliminary result)   Collection Time: 08/30/19  6:29 PM   Specimen: BLOOD  Result Value Ref Range Status   Specimen Description BLOOD BLOOD LEFT ARM  Final   Special Requests   Final    BOTTLES DRAWN AEROBIC AND ANAEROBIC Blood Culture adequate volume   Culture  Setup Time   Final    GRAM POSITIVE COCCI IN BOTH AEROBIC AND ANAEROBIC BOTTLES Organism ID to follow CRITICAL RESULT CALLED TO, READ BACK BY AND VERIFIED WITH: SCOTT HALL AT 0713 ON 08/31/19 [facility] Performed at [facility], [address] [place], [address] [place]    Culture [facility] POSITIVE COCCI  Final   Report Status PENDING  Incomplete  Blood Culture ID Panel (Reflexed)     Status: Abnormal   Collection Time: 08/30/19  6:29 PM  Result Value Ref Range Status   Enterococcus species NOT DETECTED NOT DETECTED Final   Listeria monocytogenes NOT DETECTED NOT DETECTED Final   Staphylococcus species DETECTED (A) NOT DETECTED Final    Comment: CRITICAL RESULT CALLED TO, READ BACK BY AND VERIFIED WITH: SCOTT HALL AT 0713 ON 08/31/19 [facility]    Staphylococcus aureus (  BCID) DETECTED (A) NOT DETECTED Final    Comment: Methicillin (oxacillin) susceptible Staphylococcus aureus (MSSA). Preferred therapy is anti staphylococcal beta lactam antibiotic (Cefazolin or Nafcillin), unless clinically contraindicated. CRITICAL RESULT CALLED TO, READ BACK BY AND VERIFIED WITH: SCOTT HALL AT 0713 ON 08/31/19 [facility]    Methicillin resistance NOT DETECTED NOT DETECTED Final   Streptococcus species NOT DETECTED NOT DETECTED Final   Streptococcus agalactiae NOT DETECTED NOT DETECTED Final    Streptococcus pneumoniae NOT DETECTED NOT DETECTED Final   Streptococcus pyogenes NOT DETECTED NOT DETECTED Final   Acinetobacter baumannii NOT DETECTED NOT DETECTED Final   Enterobacteriaceae species NOT DETECTED NOT DETECTED Final   Enterobacter cloacae complex NOT DETECTED NOT DETECTED Final   Escherichia coli NOT DETECTED NOT DETECTED Final   Klebsiella oxytoca NOT DETECTED NOT DETECTED Final   Klebsiella pneumoniae NOT DETECTED NOT DETECTED Final   Proteus species NOT DETECTED NOT DETECTED Final   Serratia marcescens NOT DETECTED NOT DETECTED Final   Haemophilus influenzae NOT DETECTED NOT DETECTED Final   Neisseria meningitidis NOT DETECTED NOT DETECTED Final   Pseudomonas aeruginosa NOT DETECTED NOT DETECTED Final   Candida albicans NOT DETECTED NOT DETECTED Final   Candida glabrata NOT DETECTED NOT DETECTED Final   Candida krusei NOT DETECTED NOT DETECTED Final   Candida parapsilosis NOT DETECTED NOT DETECTED Final   Candida tropicalis NOT DETECTED NOT DETECTED Final    Comment: Performed at [facility], [address] [place], [place] [address]  Blood Culture (routine x 2)     Status: None (Preliminary result)   Collection Time: 08/30/19  6:57 PM   Specimen: BLOOD  Result Value Ref Range Status   Specimen Description BLOOD BLOOD LEFT ARM  Final   Special Requests   Final    BOTTLES DRAWN AEROBIC AND ANAEROBIC Blood Culture results may not be optimal due to an inadequate volume of blood received in culture bottles   Culture   Final    NO GROWTH < 12 HOURS Performed at [facility], [address] [place], [place] [address]    Report Status PENDING  Incomplete  Respiratory Panel by RT PCR (Flu A&B, Covid) - Nasopharyngeal Swab     Status: None   Collection Time: 08/30/19  7:19 PM   Specimen: Nasopharyngeal Swab  Result Value Ref Range Status   SARS Coronavirus 2 by RT PCR NEGATIVE NEGATIVE Final    Comment: (NOTE) SARS-CoV-2 target nucleic acids are  NOT DETECTED. The SARS-CoV-2 RNA is generally detectable in upper respiratoy specimens during the acute phase of infection. The lowest concentration of SARS-CoV-2 viral copies this assay can detect is 131 copies/mL. A negative result does not preclude SARS-Cov-2 infection and should not be used as the sole basis for treatment or other patient management decisions. A negative result may occur with  improper specimen collection/handling, submission of specimen other than nasopharyngeal swab, presence of viral mutation(s) within the areas targeted by this assay, and inadequate number of viral copies (<131 copies/mL). A negative result must be combined with clinical observations, patient history, and epidemiological information. The expected result is Negative. Fact Sheet for Patients:  PinkCheek.be Fact Sheet for Healthcare Providers:  GravelBags.it This test is not yet ap proved or cleared by the [place] FDA and  has been authorized for detection and/or diagnosis of SARS-CoV-2 by FDA under an Emergency Use Authorization (EUA). This EUA will remain  in effect (meaning this test can be used) for the duration of the COVID-19 declaration  under Section 564(b)(1) of the Act, 21 U.S.C. section 360bbb-3(b)(1), unless the authorization is terminated or revoked sooner.    Influenza A by PCR NEGATIVE NEGATIVE Final   Influenza B by PCR NEGATIVE NEGATIVE Final    Comment: (NOTE) The Xpert Xpress SARS-CoV-2/FLU/RSV assay is intended as an aid in  the diagnosis of influenza from Nasopharyngeal swab specimens and  should not be used as a sole basis for treatment. Nasal washings and  aspirates are unacceptable for Xpert Xpress SARS-CoV-2/FLU/RSV  testing. Fact Sheet for Patients: https://www.moore.com/ Fact Sheet for Healthcare Providers: https://www.young.biz/ This test is not yet approved or  cleared by the [place] FDA and  has been authorized for detection and/or diagnosis of SARS-CoV-2 by  FDA under an Emergency Use Authorization (EUA). This EUA will remain  in effect (meaning this test can be used) for the duration of the  Covid-19 declaration under Section 564(b)(1) of the Act, 21  U.S.C. section 360bbb-3(b)(1), unless the authorization is  terminated or revoked. Performed at [facility], [address] [place], [place] [address]   MRSA PCR Screening     Status: None   Collection Time: 08/30/19 11:30 PM   Specimen: Nasopharyngeal  Result Value Ref Range Status   MRSA by PCR NEGATIVE NEGATIVE Final    Comment:        The GeneXpert MRSA Assay (FDA approved for NASAL specimens only), is one component of a comprehensive MRSA colonization surveillance program. It is not intended to diagnose MRSA infection nor to guide or monitor treatment for MRSA infections. Performed at [facility], [address] [place], [place] [address]           IMAGING    DG Chest 1 View  Result Date: 08/30/2019 CLINICAL DATA:  Sepsis. Patient states infection in arm, hand, and endocarditis. Recent hospitalization for endocarditis. EXAM: CHEST  1 VIEW COMPARISON:  Most recent radiograph 06/16/2018, no interval chest imaging. FINDINGS: Multifocal patchy and nodular opacities throughout both lungs, some of which appear cavitary. Post median sternotomy. Prosthetic cardiac valve. Heart is normal in size. Normal mediastinal contours. No pneumothorax or large pleural effusion. Multiple surgical clips project over the left hemithorax. Surgical hardware in the lower cervical spine is partially included. IMPRESSION: Multifocal pulmonary opacities, some of which appear cavitary, most consistent with septic emboli in the setting of recent endocarditis. Electronically Signed   By: Narda Rutherford M.D.   On: 08/30/2019 19:23   ECHOCARDIOGRAM COMPLETE  Result Date:  08/31/2019    ECHOCARDIOGRAM REPORT   Patient Name:   Andrea King Date of Exam: 08/31/2019 Medical Rec #:  [id number]       Height:       62.0 in Accession #:    [id number]      Weight:       101.4 lb Date of Birth:  [birth year]      BSA:          1.432 m Patient Age:    43 years        BP:           112/64 mmHg Patient Gender: F               HR:           140 bpm. Exam Location:  [facility] Procedure: 2D Echo, Color Doppler and Cardiac Doppler Indications:     I38 Endocarditis  History:         Patient has prior history of Echocardiogram  examinations.                  Substance abuse; hepatitis C.  Sonographer:     Humphrey RollsJoan Heiss RDCS (AE) Referring Phys:  [id number] Eugenie NorrieANA G BLAKENEY Diagnosing Phys: Adrian BlackwaterShaukat Khan MD  Sonographer Comments: Image acquisition challenging due to uncooperative patient. IMPRESSIONS  1. Left ventricular ejection fraction, by estimation, is 60 to 65%. The left ventricle has normal function. The left ventricle has no regional wall motion abnormalities. Left ventricular diastolic parameters are consistent with Grade I diastolic dysfunction (impaired relaxation).  2. Right ventricular systolic function is normal. The right ventricular size is normal. There is normal pulmonary artery systolic pressure.  3. Left atrial size was mildly dilated.  4. Right atrial size was mildly dilated.  5. The mitral valve is normal in structure. No evidence of mitral valve regurgitation. No evidence of mitral stenosis.  6. Vegetation on tip of leaflets of TV, 1.2x.5 cm. 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  7. The aortic valve is normal in structure. Aortic valve regurgitation is not visualized. No aortic stenosis is present.  8. The inferior vena cava is normal in size with greater than 50% respiratory variability, suggesting right atrial pressure of 3 mmHg. Comparison(s): Vegetation on tricuspid valve. FINDINGS  Left Ventricle: Left ventricular ejection fraction, by estimation,  is 60 to 65%. The left ventricle has normal function. The left ventricle has no regional wall motion abnormalities. The left ventricular internal cavity size was normal in size. There is  no left ventricular hypertrophy. Left ventricular diastolic parameters are consistent with Grade I diastolic dysfunction (impaired relaxation). Right Ventricle: The right ventricular size is normal. No increase in right ventricular wall thickness. Right ventricular systolic function is normal. There is normal pulmonary artery systolic pressure. The tricuspid regurgitant velocity is 2.48 m/s, and  with an assumed right atrial pressure of 10 mmHg, the estimated right ventricular systolic pressure is 34.6 mmHg. Left Atrium: Left atrial size was mildly dilated. Right Atrium: Right atrial size was mildly dilated. Pericardium: There is no evidence of pericardial effusion. Mitral Valve: The mitral valve is normal in structure. Normal mobility of the mitral valve leaflets. No evidence of mitral valve regurgitation. No evidence of mitral valve stenosis. MV peak gradient, 5.0 mmHg. The mean mitral valve gradient is 3.0 mmHg. Tricuspid Valve: Vegetation on tip of leaflets of TV, 1.2x.5 cm. The tricuspid valve is myxomatous. Tricuspid valve regurgitation is moderate . No evidence of tricuspid stenosis. Aortic Valve: The aortic valve is normal in structure. Aortic valve regurgitation is not visualized. No aortic stenosis is present. Aortic valve mean gradient measures 9.0 mmHg. Aortic valve peak gradient measures 16.0 mmHg. Aortic valve area, by VTI measures 2.54 cm. Pulmonic Valve: The pulmonic valve was normal in structure. Pulmonic valve regurgitation is not visualized. No evidence of pulmonic stenosis. Aorta: The aortic root is normal in size and structure. Venous: The inferior vena cava is normal in size with greater than 50% respiratory variability, suggesting right atrial pressure of 3 mmHg. IAS/Shunts: No atrial level shunt detected by  color flow Doppler.  LEFT VENTRICLE PLAX 2D LVIDd:         4.33 cm  Diastology LVIDs:         2.83 cm  LV e' lateral:   19.60 cm/s LV PW:         0.93 cm  LV E/e' lateral: 3.5 LV IVS:        0.66 cm  LV  e' medial:    10.10 cm/s LVOT diam:     1.90 cm  LV E/e' medial:  6.7 LV SV:         68 LV SV Index:   48 LVOT Area:     2.84 cm  RIGHT VENTRICLE RV Basal diam:  2.63 cm LEFT ATRIUM             Index       RIGHT ATRIUM           Index LA diam:        3.40 cm 2.37 cm/m  RA Area:     13.30 cm LA Vol (A2C):   27.5 ml 19.20 ml/m RA Volume:   31.00 ml  21.64 ml/m LA Vol (A4C):   31.2 ml 21.78 ml/m LA Biplane Vol: 30.5 ml 21.29 ml/m  AORTIC VALVE                    PULMONIC VALVE AV Area (Vmax):    2.54 cm     PV Vmax:       1.34 m/s AV Area (Vmean):   2.45 cm     PV Vmean:      87.100 cm/s AV Area (VTI):     2.54 cm     PV VTI:        0.192 m AV Vmax:           200.00 cm/s  PV Peak grad:  7.2 mmHg AV Vmean:          140.000 cm/s PV Mean grad:  4.0 mmHg AV VTI:            0.269 m AV Peak Grad:      16.0 mmHg AV Mean Grad:      9.0 mmHg LVOT Vmax:         179.00 cm/s LVOT Vmean:        121.000 cm/s LVOT VTI:          0.241 m LVOT/AV VTI ratio: 0.90  AORTA Ao Root diam: 2.70 cm MITRAL VALVE               TRICUSPID VALVE MV Area (PHT): 6.71 cm    TR Peak grad:   24.6 mmHg MV Peak grad:  5.0 mmHg    TR Vmax:        248.00 cm/s MV Mean grad:  3.0 mmHg MV Vmax:       1.12 m/s    SHUNTS MV Vmean:      82.0 cm/s   Systemic VTI:  0.24 m MV Decel Time: 113 msec    Systemic Diam: 1.90 cm MV E velocity: 67.90 cm/s MV A velocity: 97.00 cm/s MV E/A ratio:  0.70 Adrian Blackwater MD Electronically signed by Adrian Blackwater MD Signature Date/Time: 08/31/2019/10:06:33 AM    Final          ASSESSMENT AND PLAN SYNOPSIS IVDA with SEVERE SEPSIS/SEPTCI SHOCK FROM ENDOCARDITIS WITH MULTIFOCAL PNEUMONIA WITH SEPTIC EMBOLI  OFF VASOPRESSORS SD STATUS AWAITING BED AT [facility]  INFECTIOUS DISEASE -continue antibiotics as  prescribed -follow up cultures -follow up ID consultation   ELECTROLYTES -follow labs as needed -replace as needed -pharmacy consultation and following    DVT/GI PRX ordered TRANSFUSIONS AS NEEDED MONITOR FSBS ASSESS the need for LABS as needed    Lucie Leather, M.D.  [facility]  Medical Director [facility] Medical Director [facility] Cardio-Pulmonary Department

## 2019-08-31 NOTE — Consult Note (Signed)
Consultation Note Date: 08/31/2019   Patient Name: Andrea King  DOB: [birth year]  MRN: [id number]  Age / Sex: 44 y.o., female  PCP: Alberteen Spindle, CNM Referring Physician: Erin Fulling, MD  Reason for Consultation: Establishing goals of care and Psychosocial/spiritual support  HPI/Patient Profile: 44 y.o. female   admitted on 08/30/2019 with significant past medical history of IV drug use, multiple infections in the past,  presented to the hospital with complaints of infection everywhere.   She reports infection in her arm hand and endocarditis.  In the ED she had a temperature of 101.5 ,blood pressure of 95/55 and heart rate of 130. Blood cultures were sent and she was started on vancomycin and cefepime.  Patient was recently in [facility] between 08/06/2019 until 08/08/2019 when she left AMA.  During that hospitalization she had MSSA bacteremia and TEE showed an intra-atrial vegetation.    Review of medical records significant for severe polysubstance abuse and significant medical conditions 2/2 to IV drug use and the unfortunate psycho-social impact.    Clinical Assessment and Goals of Care:   This NP Lorinda Creed reviewed medical records, received report from team, assessed the patient and then meet at the patient's bedside   to discuss diagnosis, prognosis, GOC, EOL wishes disposition and options.  Patient declined to engage in conversation with me at this time "I don't want to tal k about anything right now".  She gave me permission to call family  I spoke to her Jeanett Schlein  # [phone] and Doylene Bode # [phone]  They are open and frank regarding Willamae's difficult situation.  Ongoing drug use, "poor choices" in her section of friends and partners, her four children, (ages 28, 29, 28 and  youngest is 46 yo), none are in her custody.    Questions and concerns addressed.    Family encouraged to call with questions or concerns.      No documented HPOA or AD.  Patient tells me today if she cannot make her own decisions she would wish for her uncle Delane Ginger to be her spokespierson     SUMMARY OF RECOMMENDATIONS    Code Status/Advance Care Planning:   Full code    Additional Recommendations (Limitations, Scope, Preferences):  Psych consult for drug rehab possibility after stablaization    Discharge Planning: Discussion regarding possible transfer to [facility]  PMT will shadow for needs while hospitalized at [facility]     Primary Diagnoses: Present on Admission: . Sepsis (HCC)   I have reviewed the medical record, interviewed the patient and family, and examined the patient. The following aspects are pertinent.  Past Medical History:  Diagnosis Date  . Anemia   . Asthma   . DJD (degenerative joint disease)   . Hepatitis C   . Substance abuse (HCC)    Social History   Socioeconomic History  . Marital status: Single    Spouse name: Not on file  . Number of children: Not on file  . Years of education: Not on  file  . Highest education level: Not on file  Occupational History  . Occupation: unemployed  Tobacco Use  . Smoking status: Current Every Day Smoker    Types: Cigarettes  . Smokeless tobacco: Never Used  Substance and Sexual Activity  . Alcohol use: No    Alcohol/week: 0.0 standard drinks  . Drug use: Yes    Comment: cocaine,Heroine  . Sexual activity: Yes    Birth control/protection: None  Other Topics Concern  . Not on file  Social History Narrative  . Not on file   Social Determinants of Health   Financial Resource Strain:   . Difficulty of Paying Living Expenses:   Food Insecurity:   . Worried About Programme researcher, broadcasting/film/video in the Last Year:   . Barista in the Last Year:   Transportation Needs:   . Freight forwarder (Medical):   Marland Kitchen Lack of Transportation (Non-Medical):   Physical Activity:   . Days of  Exercise per Week:   . Minutes of Exercise per Session:   Stress:   . Feeling of Stress :   Social Connections:   . Frequency of Communication with Friends and Family:   . Frequency of Social Gatherings with Friends and Family:   . Attends Religious Services:   . Active Member of Clubs or Organizations:   . Attends Banker Meetings:   Marland Kitchen Marital Status:    Family History  Problem Relation Age of Onset  . Cancer Mother   . Cancer - Colon Maternal Grandfather   . Diabetes Maternal Grandfather    Scheduled Meds: . Chlorhexidine Gluconate Cloth  6 each Topical Daily  . enoxaparin (LOVENOX) injection  40 mg Subcutaneous Daily  . fluconazole  100 mg Oral Daily   Continuous Infusions: . sodium chloride    . sodium chloride    . sodium chloride    .  ceFAZolin (ANCEF) IV 2 g (08/31/19 0950)   PRN Meds:.docusate sodium, ipratropium-albuterol, morphine injection, ondansetron (ZOFRAN) IV, oxyCODONE, polyethylene glycol, promethazine Medications Prior to Admission:  Prior to Admission medications   Not on File   No Known Allergies Review of Systems  Constitutional:       - "pain all over"  Neurological: Headaches:      Physical Exam Constitutional:      Appearance: She is cachectic. She is ill-appearing.     Interventions: Nasal cannula in place.  Cardiovascular:     Rate and Rhythm: Tachycardia present.  Skin:    General: Skin is warm and dry.  Neurological:     Mental Status: She is alert.     Motor: Weakness present.     Vital Signs: BP 112/64   Pulse (!) 101   Temp (!) 97 F (36.1 C) (Oral)   Resp (!) 38   Ht 5\' 2"  (1.575 m)   Wt 46 kg   SpO2 100%   BMI 18.55 kg/m  Pain Scale: 0-10 POSS *See Group Information*: 2-Acceptable,Slightly drowsy, easily aroused Pain Score: 10-Worst pain ever   SpO2: SpO2: 100 % O2 Device:SpO2: 100 % O2 Flow Rate: .O2 Flow Rate (L/min): 2 L/min  IO: Intake/output summary:   Intake/Output Summary (Last 24  hours) at 08/31/2019 1113 Last data filed at 08/31/2019 0300 Gross per 24 hour  Intake 618.33 ml  Output --  Net 618.33 ml    LBM:   Baseline Weight: Weight: 45.4 kg Most recent weight: Weight: 46 kg     Palliative Assessment/Data:  Discussed with Dr Mortimer Fries  Time In: 1000 Time Out: 1110 Time Total: 70 minutes Greater than 50%  of this time was spent counseling and coordinating care related to the above assessment and plan.  Signed by: Wadie Lessen, NP   Please contact Palliative Medicine Team phone at [phone] for questions and concerns.  For individual provider: See Shea Evans

## 2019-08-31 NOTE — Progress Notes (Signed)
Pt refused am labs this morning, I arrived at bedside and discussed the importance of allowing the phlebotomist to obtain labs to determine if she needs another transfusion of pRBC's along with assessing other lab values.  However, when I ask Mrs. Walls why she has been refusing labs she states because "I am cold" repeatedly in addition she is requesting more pain medicine.  Pts RN informed me the pt just received iv morphine.  Pt states she wants to have a BM before she will get her labs drawn will continue to monitor and assess pt.  Sonda Rumble, AGNP  Pulmonary/Critical Care Pager [phone] (please enter 7 digits) PCCM Consult Pager [phone] (please enter 7 digits)

## 2019-08-31 NOTE — Consult Note (Signed)
NAME: Andrea King  DOB: [birth year]  MRN: [id number]  Date/Time: 08/31/2019 1:48 PM  REQUESTING PROVIDER: Salem Caster  Subjective:  REASON FOR CONSULT: Staph aureus bacteremia Pt a limited historian Andrea King is a 44 y.o. female with a history of IV drug use, multiple infections in the past presents to the hospital with complaints of infection everywhere.  Patient stated that she had infection in her arm hand and endocarditis.  In the ED she had a temperature of 101.5 ,blood pressure of 95/55 and heart rate of 130. Blood cultures were sent and she was started on vancomycin and cefepime.   Patient was recently in [facility] between 08/06/2019 until 08/08/2019 when she left AMA.  During that hospitalization she had MSSA bacteremia and TEE showed an intra-atrial vegetation.  ID had seen her and they wanted cardiothoracic surgeon to evaluate her when she left the hospital.  She was sent on p.o. doxycycline.    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  Left AMA before completion of treatment.  03/03/2018 presents to [facility] again with fever.  MRI showed progressing osteomyelitis discitis. Seen by neurosurgery and transferred to [facility] in [place] between 03/05/2018 until 05/03/2018.  BC 11/2, 11/3, 11/5 MRSA BC 11/6 enterococcus fecalis- S to amp BC 11/8, 11/9, 11/11  NG BC 11/17 MRSA BC 11/18, 11/19, 11/21,11/22 NG BC 11/28 serratia ( S to PIP tazo, Cefepime, cipro  R to ceftriaxone   [facility] 12/2,12/11, 12/18 and 12/27 NG  Underwent C5-C6 corpectomy on 03/17/2018 nd C4 C7 fusion.  Tissue culture was negative. On 04/06/2018 was taken back for surgery for hardware migration and  unstable cervical spine and had posterior fusion intervention.  Culture taken from the tissue then was also negative. Patient was initially on vancomycin and ceftaroline and the latter was stopped because of AKI.  She had intermittent fever during that hospitalization and multiple blood cultures were done and intermittently it was positive for various organisms like Serratia and enterococcus raising suspicion of manipulation of the PICC line  Bilateral septic arthritis of the knee status post washout and MRSA on the left knee November 2019.  She also had low back pain and was noted to have lumbar osteomyelitis and phlegmon on the MRI of the lumbar spine.  This was not amenable to IR drainage.  She completed vancomycin on 05/03/2018 was sent home with suppressive doxycycline indefinitely.  She was admitted to [facility] again in February 2020 between 06/15/2018 and 06/18/2018.  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  She decided to leave AMA again as she was found to have a Careers adviser and had an unknown fluid drawn after having a visitor. She was later admitted to [facility] in end of February with negative blood cultures but progressing lumbar spine osteomyelitis thought to be due to MRSA. ID saw her and neuro IR did aspiration without bone biopsy in 3 to.  Aspiration culture was negative and so patient was transitioned from IV Vanco and cefepime to p.o. clindamycin and ciprofloxacin for 6 weeks with a follow-up with the ID clinic and long-term suppressive treatment with Doxy.  .  She saw ID on a televisit June 2020 and and because of side effects  to Doxy it was stopped and she was put on clindamycin 300 mg 3 times a day.  She then [facility] and was admitted between 08/06/2019 to 08/08/2019 and she left AMA again.  During that hospitalization she was found to have staph aureus bacteremia on the cultures from 08/06/2019.  TTE was without valvular vegetation.  TEE without valvular regurgitation  with evidence of right interatrial vegetation.  She was initially started on vancomycin and cefepime but was then transitioned to nafcillin after peripheral blood cultures grew MSSA.  Infectious disease had recommended CT surgery consult to further evaluate for surgical intervention of the intra-atrial vegetation.  But patient left before CT surgery could assess her.  She was discharged on oral doxycycline.  Past Medical History:  Diagnosis Date  . Anemia   . Asthma   . DJD (degenerative joint disease)   . Hepatitis C   . Substance abuse [facility](HCC)     Past Surgical History:  Procedure Laterality Date  . INGUINAL HERNIA REPAIR Left 04/28/2013   Procedure: HERNIA REPAIR INGUINAL ADULT with mesh;  Surgeon: Cherylynn RidgesJames O Wyatt, MD;  [facility];  Service: General;  Laterality: Left;  . TEE WITHOUT CARDIOVERSION N/A 11/18/2015   Procedure: TRANSESOPHAGEAL ECHOCARDIOGRAM (TEE);  Surgeon: Iran OuchMuhammad A Arida, MD;  Location: [facility] ORS;  Service: Cardiovascular;  Laterality: N/A;  . TEE WITHOUT CARDIOVERSION N/A 12/31/2017   Procedure: TRANSESOPHAGEAL ECHOCARDIOGRAM (TEE);  Surgeon: Dalia HeadingFath, Kenneth A, MD;  Location: [facility] ORS;  Service: Cardiovascular;  Laterality: N/A;  . TEE WITHOUT CARDIOVERSION N/A 01/04/2018   Procedure: TRANSESOPHAGEAL ECHOCARDIOGRAM (TEE);  Surgeon: Dalia HeadingFath, Kenneth A, MD;  Location: [facility] ORS;  Service: Cardiovascular;  Laterality: N/A;  . TUMOR EXCISION     left scalpula    Social History   Socioeconomic History  . Marital status: Single    Spouse name: Not on file  . Number of children: Not on file  . Years of education: Not on file  . Highest education level: Not on file  Occupational History  . Occupation: unemployed  Tobacco Use  . Smoking status: Current Every Day Smoker    Types: Cigarettes  . Smokeless tobacco: Never Used  Substance and Sexual Activity  . Alcohol use: No    Alcohol/week: 0.0 standard drinks  . Drug use: Yes    Comment: cocaine,Heroine  . Sexual activity:  Yes    Birth control/protection: None  Other Topics Concern  . Not on file  Social History Narrative  . Not on file   Social Determinants of Health   Financial Resource Strain:   . Difficulty of Paying Living Expenses:   Food Insecurity:   . Worried About Programme researcher, broadcasting/film/videounning Out of Food in the Last Year:   . Baristaan Out of Food in the Last Year:   Transportation Needs:   . Freight forwarderLack of Transportation (Medical):   Marland Kitchen. Lack of Transportation (Non-Medical):   Physical Activity:   . Days of Exercise per Week:   . Minutes of Exercise per Session:   Stress:   . Feeling of Stress :   Social Connections:   . Frequency of Communication with Friends and Family:   . Frequency of Social Gatherings with Friends and Family:   . Attends Religious Services:   . Active Member of Clubs or Organizations:   . Attends BankerClub or Organization Meetings:   Marland Kitchen. Marital Status:   Intimate Partner Violence:   . Fear of Current or Ex-Partner:   . Emotionally Abused:   .  Physically Abused:   . Sexually Abused:     Family History  Problem Relation Age of Onset  . Cancer Mother   . Cancer - Colon Maternal Grandfather   . Diabetes Maternal Grandfather    No Known Allergies  ? Current Facility-Administered Medications  Medication Dose Route Frequency Provider Last Rate Last Admin  . 0.9 %  sodium chloride infusion  10 mL/hr Intravenous Once Willy Eddy, MD      . 0.9 %  sodium chloride infusion   Intravenous Continuous Eugenie Norrie, NP 75 mL/hr at 08/31/19 1310 New Bag at 08/31/19 1310  . 0.9 %  sodium chloride infusion  250 mL Intravenous Continuous Eugenie Norrie, NP      . ALPRAZolam Prudy Feeler) tablet 1 mg  1 mg Oral TID Erin Fulling, MD   1 mg at 08/31/19 1129  . ceFAZolin (ANCEF) IVPB 2g/100 mL premix  2 g Intravenous Q8H Hall, Scott A, RPH 200 mL/hr at 08/31/19 0950 2 g at 08/31/19 0950  . Chlorhexidine Gluconate Cloth 2 % PADS 6 each  6 each Topical Daily Erin Fulling, MD   6 each at 08/31/19 0956  . docusate  sodium (COLACE) capsule 100 mg  100 mg Oral BID PRN Eugenie Norrie, NP      . enoxaparin (LOVENOX) injection 40 mg  40 mg Subcutaneous Daily Kasa, Kurian, MD      . feeding supplement (ENSURE ENLIVE) (ENSURE ENLIVE) liquid 237 mL  237 mL Oral TID BM Erin Fulling, MD      . fluconazole (DIFLUCAN) tablet 100 mg  100 mg Oral Daily Erin Fulling, MD   100 mg at 08/31/19 1109  . ipratropium-albuterol (DUONEB) 0.5-2.5 (3) MG/3ML nebulizer solution 3 mL  3 mL Nebulization Q6H PRN Eugenie Norrie, NP      . morphine 2 MG/ML injection 2-4 mg  2-4 mg Intravenous Q3H PRN Eugenie Norrie, NP   4 mg at 08/31/19 1306  . ondansetron (ZOFRAN) injection 4 mg  4 mg Intravenous Q6H PRN Eugenie Norrie, NP      . oxyCODONE (Oxy IR/ROXICODONE) immediate release tablet 10 mg  10 mg Oral Q3H PRN Willy Eddy, MD   10 mg at 08/31/19 0734  . polyethylene glycol (MIRALAX / GLYCOLAX) packet 17 g  17 g Oral Daily PRN Eugenie Norrie, NP      . promethazine (PHENERGAN) injection 25 mg  25 mg Intravenous Q6H PRN Willy Eddy, MD   25 mg at 08/31/19 0900     Abtx:  Anti-infectives (From admission, onward)   Start     Dose/Rate Route Frequency Ordered Stop   08/31/19 1800  vancomycin (VANCOCIN) IVPB 1000 mg/200 mL premix  Status:  Discontinued     1,000 mg 200 mL/hr over 60 Minutes Intravenous Every 24 hours 08/31/19 0057 08/31/19 0724   08/31/19 1030  fluconazole (DIFLUCAN) tablet 100 mg     100 mg Oral Daily 08/31/19 1018     08/31/19 0900  ceFAZolin (ANCEF) IVPB 2g/100 mL premix     2 g 200 mL/hr over 30 Minutes Intravenous Every 8 hours 08/31/19 0725     08/31/19 0300  ceFEPIme (MAXIPIME) 2 g in sodium chloride 0.9 % 100 mL IVPB  Status:  Discontinued     2 g 200 mL/hr over 30 Minutes Intravenous Every 8 hours 08/31/19 0009 08/31/19 0723   08/30/19 1815  ceFEPIme (MAXIPIME) 2 g in sodium chloride 0.9 % 100 mL IVPB  2 g 200 mL/hr over 30 Minutes Intravenous  Once 08/30/19 1801 08/30/19 1913    08/30/19 1815  metroNIDAZOLE (FLAGYL) IVPB 500 mg     500 mg 100 mL/hr over 60 Minutes Intravenous  Once 08/30/19 1801 08/30/19 2016   08/30/19 1815  vancomycin (VANCOCIN) IVPB 1000 mg/200 mL premix     1,000 mg 200 mL/hr over 60 Minutes Intravenous  Once 08/30/19 1801 08/30/19 2043      REVIEW OF SYSTEMS:  Says she is not feeling well Drowsy, does not want to talk   Objective:  VITALS:  BP 112/64   Pulse (!) 101   Temp (!) 97 F (36.1 C) (Oral)   Resp (!) 38   Ht 5\' 2"  (1.575 m)   Wt 46 kg   SpO2 100%   BMI 18.55 kg/m  PHYSICAL EXAM:  General:sitting in bed, lethargic, ill looking, pale, sob  Head: Normocephalic, without obvious abnormality, atraumatic. Eyes: Conjunctivae clear, anicteric sclerae. Pupils are equal ENT - did not examine Neck: Supple,  Back: did not examine Lungs: b/l air entry- crepts present b/l Heart: tachycardia- systolic murmur Abdomen: Soft, non-tender,not distended. Bowel sounds normal. No masses Extremities: atraumatic, no cyanosis. No edema. No clubbing Skin: limited examination Toes and fingers no infarcts or splinter hemorrhage Lymph: Cervical, supraclavicular normal. Neurologic: Grossly non-focal Pertinent Labs Lab Results CBC    Component Value Date/Time   WBC 26.6 (H) 08/30/2019 1829   RBC 2.25 (L) 08/30/2019 1829   HGB 7.1 (L) 08/31/2019 0734   HCT 21.0 (L) 08/31/2019 0734   PLT 268 08/30/2019 1829   MCV 67.6 (L) 08/30/2019 1829   MCH 22.7 (L) 08/30/2019 1829   MCHC 33.6 08/30/2019 1829   RDW 17.2 (H) 08/30/2019 1829   LYMPHSABS 2.4 08/30/2019 1829   MONOABS 1.4 (H) 08/30/2019 1829   EOSABS 0.0 08/30/2019 1829   BASOSABS 0.1 08/30/2019 1829    CMP Latest Ref Rng & Units 08/31/2019 08/30/2019 06/18/2018  Glucose 70 - 99 mg/dL 06/20/2018) 518(A) 416(S)  BUN 6 - 20 mg/dL 20 063(K) 19  Creatinine 0.44 - 1.00 mg/dL 16(W 1.09 3.23  Sodium 135 - 145 mmol/L 135 125(L) 141  Potassium 3.5 - 5.1 mmol/L 4.2 3.8 3.7  Chloride 98 - 111 mmol/L  104 92(L) 107  CO2 22 - 32 mmol/L 21(L) 21(L) 28  Calcium 8.9 - 10.3 mg/dL 7.2(L) 7.4(L) 8.8(L)  Total Protein 6.5 - 8.1 g/dL 7.3 6.7 -  Total Bilirubin 0.3 - 1.2 mg/dL 0.5 0.4 -  Alkaline Phos 38 - 126 U/L 104 91 -  AST 15 - 41 U/L 34 37 -  ALT 0 - 44 U/L 17 16 -      Microbiology: Recent Results (from the past 240 hour(s))  Culture, blood (Routine x 2)     Status: None (Preliminary result)   Collection Time: 08/30/19  6:29 PM   Specimen: BLOOD  Result Value Ref Range Status   Specimen Description BLOOD BLOOD LEFT ARM  Final   Special Requests   Final    BOTTLES DRAWN AEROBIC AND ANAEROBIC Blood Culture adequate volume   Culture  Setup Time   Final    GRAM POSITIVE COCCI IN BOTH AEROBIC AND ANAEROBIC BOTTLES Organism ID to follow CRITICAL RESULT CALLED TO, READ BACK BY AND VERIFIED WITH: SCOTT HALL AT 0713 ON 08/31/19 [facility] Performed at [facility], [address], [place], [place]    Culture [facility] POSITIVE COCCI  Final   Report Status PENDING  Incomplete  Blood Culture ID Panel (Reflexed)     Status: Abnormal   Collection Time: 08/30/19  6:29 PM  Result Value Ref Range Status   Enterococcus species NOT DETECTED NOT DETECTED Final   Listeria monocytogenes NOT DETECTED NOT DETECTED Final   Staphylococcus species DETECTED (A) NOT DETECTED Final    Comment: CRITICAL RESULT CALLED TO, READ BACK BY AND VERIFIED WITH: SCOTT HALL AT 0713 ON 08/31/19 [facility]    Staphylococcus aureus (BCID) DETECTED (A) NOT DETECTED Final    Comment: Methicillin (oxacillin) susceptible Staphylococcus aureus (MSSA). Preferred therapy is anti staphylococcal beta lactam antibiotic (Cefazolin or Nafcillin), unless clinically contraindicated. CRITICAL RESULT CALLED TO, READ BACK BY AND VERIFIED WITH: SCOTT HALL AT 0713 ON 08/31/19 [facility]    Methicillin resistance NOT DETECTED NOT DETECTED Final   Streptococcus species NOT DETECTED NOT DETECTED Final   Streptococcus agalactiae NOT DETECTED NOT  DETECTED Final   Streptococcus pneumoniae NOT DETECTED NOT DETECTED Final   Streptococcus pyogenes NOT DETECTED NOT DETECTED Final   Acinetobacter baumannii NOT DETECTED NOT DETECTED Final   Enterobacteriaceae species NOT DETECTED NOT DETECTED Final   Enterobacter cloacae complex NOT DETECTED NOT DETECTED Final   Escherichia coli NOT DETECTED NOT DETECTED Final   Klebsiella oxytoca NOT DETECTED NOT DETECTED Final   Klebsiella pneumoniae NOT DETECTED NOT DETECTED Final   Proteus species NOT DETECTED NOT DETECTED Final   Serratia marcescens NOT DETECTED NOT DETECTED Final   Haemophilus influenzae NOT DETECTED NOT DETECTED Final   Neisseria meningitidis NOT DETECTED NOT DETECTED Final   Pseudomonas aeruginosa NOT DETECTED NOT DETECTED Final   Candida albicans NOT DETECTED NOT DETECTED Final   Candida glabrata NOT DETECTED NOT DETECTED Final   Candida krusei NOT DETECTED NOT DETECTED Final   Candida parapsilosis NOT DETECTED NOT DETECTED Final   Candida tropicalis NOT DETECTED NOT DETECTED Final    Comment: Performed at [facility], [address] [place], [place] [address]  Blood Culture (routine x 2)     Status: None (Preliminary result)   Collection Time: 08/30/19  6:57 PM   Specimen: BLOOD  Result Value Ref Range Status   Specimen Description BLOOD BLOOD LEFT ARM  Final   Special Requests   Final    BOTTLES DRAWN AEROBIC AND ANAEROBIC Blood Culture results may not be optimal due to an inadequate volume of blood received in culture bottles   Culture   Final    NO GROWTH < 12 HOURS Performed at [facility], [address] [place], [place] [address]    Report Status PENDING  Incomplete  Respiratory Panel by RT PCR (Flu A&B, Covid) - Nasopharyngeal Swab     Status: None   Collection Time: 08/30/19  7:19 PM   Specimen: Nasopharyngeal Swab  Result Value Ref Range Status   SARS Coronavirus 2 by RT PCR NEGATIVE NEGATIVE Final    Comment: (NOTE) SARS-CoV-2 target  nucleic acids are NOT DETECTED. The SARS-CoV-2 RNA is generally detectable in upper respiratoy specimens during the acute phase of infection. The lowest concentration of SARS-CoV-2 viral copies this assay can detect is 131 copies/mL. A negative result does not preclude SARS-Cov-2 infection and should not be used as the sole basis for treatment or other patient management decisions. A negative result may occur with  improper specimen collection/handling, submission of specimen other than nasopharyngeal swab, presence of viral mutation(s) within the areas targeted by this assay, and inadequate number of viral copies (<131 copies/mL). A negative result must be  combined with clinical observations, patient history, and epidemiological information. The expected result is Negative. Fact Sheet for Patients:  PinkCheek.be Fact Sheet for Healthcare Providers:  GravelBags.it This test is not yet ap proved or cleared by the [place] FDA and  has been authorized for detection and/or diagnosis of SARS-CoV-2 by FDA under an Emergency Use Authorization (EUA). This EUA will remain  in effect (meaning this test can be used) for the duration of the COVID-19 declaration under Section 564(b)(1) of the Act, 21 U.S.C. section 360bbb-3(b)(1), unless the authorization is terminated or revoked sooner.    Influenza A by PCR NEGATIVE NEGATIVE Final   Influenza B by PCR NEGATIVE NEGATIVE Final    Comment: (NOTE) The Xpert Xpress SARS-CoV-2/FLU/RSV assay is intended as an aid in  the diagnosis of influenza from Nasopharyngeal swab specimens and  should not be used as a sole basis for treatment. Nasal washings and  aspirates are unacceptable for Xpert Xpress SARS-CoV-2/FLU/RSV  testing. Fact Sheet for Patients: PinkCheek.be Fact Sheet for Healthcare Providers: GravelBags.it This test is not  yet approved or cleared by the [place] FDA and  has been authorized for detection and/or diagnosis of SARS-CoV-2 by  FDA under an Emergency Use Authorization (EUA). This EUA will remain  in effect (meaning this test can be used) for the duration of the  Covid-19 declaration under Section 564(b)(1) of the Act, 21  U.S.C. section 360bbb-3(b)(1), unless the authorization is  terminated or revoked. Performed at [facility], [address] [place], [place] [address]   MRSA PCR Screening     Status: None   Collection Time: 08/30/19 11:30 PM   Specimen: Nasopharyngeal  Result Value Ref Range Status   MRSA by PCR NEGATIVE NEGATIVE Final    Comment:        The GeneXpert MRSA Assay (FDA approved for NASAL specimens only), is one component of a comprehensive MRSA colonization surveillance program. It is not intended to diagnose MRSA infection nor to guide or monitor treatment for MRSA infections. Performed at [facility], [address] [place], [place] [address]     IMAGING RESULTS: Multifocal pulmonary opacities, some of which appear cavitary, most consistent with septic emboli in the setting of recent endocarditis I have personally reviewed the films ? Impression/Recommendation ? Recurrent infections in  an IV drug user. MSSA bacteremia with intra-atrial vegetation. Currently on vancomycin and cefepime will change to cefazolin or nafcillin.  Septic emboli lings  History of MRSA bacteremia/strep sanguis bacteremia and infective endocarditis in 2017 followed by tricuspid valve replacement  Cervical and lumbar spine osteomyelitis In October/November/December 2019 with Serratia bacteremia, MRSA, Enterococcus C4-T2 fusion _____________________________________________Discussed with requesting provider Note:  This document was prepared using Dragon voice recognition software and may include unintentional dictation errors.

## 2019-08-31 NOTE — Progress Notes (Addendum)
PHARMACY - PHYSICIAN COMMUNICATION CRITICAL VALUE ALERT - BLOOD CULTURE IDENTIFICATION (BCID)  Andrea King is an 44 y.o. female who presented to [facility] on 08/30/2019 with a chief complaint of fever  Assessment:  Lab reports 2 of 4 bottles + for staph aureus, MecA not detected  Name of physician (or Provider) Contacted: [facility] pharmacist, M. Simpson PharmD, Dr Rivka Safer  Current antibiotics: Vancomycin and Cefepime  Changes to prescribed antibiotics recommended: Consider switch to Cefazolin >> spoke w/ Dr Rivka Safer will change to Ancef 2gm IV q8hrs per protocol  Results for orders placed or performed during the hospital encounter of 08/30/19  Blood Culture ID Panel (Reflexed) (Collected: 08/30/2019  6:29 PM)  Result Value Ref Range   Enterococcus species NOT DETECTED NOT DETECTED   Listeria monocytogenes NOT DETECTED NOT DETECTED   Staphylococcus species DETECTED (A) NOT DETECTED   Staphylococcus aureus (BCID) DETECTED (A) NOT DETECTED   Methicillin resistance NOT DETECTED NOT DETECTED   Streptococcus species NOT DETECTED NOT DETECTED   Streptococcus agalactiae NOT DETECTED NOT DETECTED   Streptococcus pneumoniae NOT DETECTED NOT DETECTED   Streptococcus pyogenes NOT DETECTED NOT DETECTED   Acinetobacter baumannii NOT DETECTED NOT DETECTED   Enterobacteriaceae species NOT DETECTED NOT DETECTED   Enterobacter cloacae complex NOT DETECTED NOT DETECTED   Escherichia coli NOT DETECTED NOT DETECTED   Klebsiella oxytoca NOT DETECTED NOT DETECTED   Klebsiella pneumoniae NOT DETECTED NOT DETECTED   Proteus species NOT DETECTED NOT DETECTED   Serratia marcescens NOT DETECTED NOT DETECTED   Haemophilus influenzae NOT DETECTED NOT DETECTED   Neisseria meningitidis NOT DETECTED NOT DETECTED   Pseudomonas aeruginosa NOT DETECTED NOT DETECTED   Candida albicans NOT DETECTED NOT DETECTED   Candida glabrata NOT DETECTED NOT DETECTED   Candida krusei NOT DETECTED NOT DETECTED   Candida  parapsilosis NOT DETECTED NOT DETECTED   Candida tropicalis NOT DETECTED NOT DETECTED    Valrie Hart A 08/31/2019  7:14 AM

## 2019-08-31 NOTE — Consult Note (Signed)
[facility] Face-to-Face Psychiatry Consult   Reason for Consult:  Compliance with Care Referring Physician:  ICU MD Patient Identification: Andrea King MRN:  [id number] Principal Diagnosis: Sepsis (HCC) Diagnosis:  Principal Problem:   Sepsis (HCC) Active Problems:   Opiate withdrawal (HCC)   Opiate abuse, continuous (HCC)   Total Time spent with patient: 20 minutes  Subjective:   Andrea King is a 44 y.o. female patient admitted with IVDA and Sepsis and non-compliance HPI: per ICU MD 44 yo female with hx of IVDU and opiate drug use along with cocaine abuse admitted with severe anemia without evidence of active bleeding, septic/hypovolemia shock secondary torecent MSSA bacteremia dx, multifocal pneumonia likely septic emboli, and endocarditis following leaving [facility] AMA and prescribed abx noncompliance  SIGNIFICANT EVENTS/STUDIES: 04/28: Pt admitted to ICU on levophed gtt 4/29 off vasopressors, minimal oxygen Past Psychiatric History:   Risk to Self:   Risk to Others:   Prior Inpatient Therapy:   Prior Outpatient Therapy:    Past Medical History:  Past Medical History:  Diagnosis Date  . Anemia   . Asthma   . DJD (degenerative joint disease)   . Hepatitis C   . Substance abuse [facility])     Past Surgical History:  Procedure Laterality Date  . INGUINAL HERNIA REPAIR Left 04/28/2013   Procedure: HERNIA REPAIR INGUINAL ADULT with mesh;  Surgeon: Cherylynn Ridges, MD;  Location: [facility] OR;  Service: General;  Laterality: Left;  . TEE WITHOUT CARDIOVERSION N/A 11/18/2015   Procedure: TRANSESOPHAGEAL ECHOCARDIOGRAM (TEE);  Surgeon: Iran Ouch, MD;  Location: [facility] ORS;  Service: Cardiovascular;  Laterality: N/A;  . TEE WITHOUT CARDIOVERSION N/A 12/31/2017   Procedure: TRANSESOPHAGEAL ECHOCARDIOGRAM (TEE);  Surgeon: Dalia Heading, MD;  Location: [facility] ORS;  Service: Cardiovascular;  Laterality: N/A;  . TEE WITHOUT CARDIOVERSION N/A 01/04/2018   Procedure: TRANSESOPHAGEAL  ECHOCARDIOGRAM (TEE);  Surgeon: Dalia Heading, MD;  Location: [facility] ORS;  Service: Cardiovascular;  Laterality: N/A;  . TUMOR EXCISION     left scalpula   Family History:  Family History  Problem Relation Age of Onset  . Cancer Mother   . Cancer - Colon Maternal Grandfather   . Diabetes Maternal Grandfather    Family Psychiatric  History: Social History:  Social History   Substance and Sexual Activity  Alcohol Use No  . Alcohol/week: 0.0 standard drinks     Social History   Substance and Sexual Activity  Drug Use Yes   Comment: cocaine,Heroine    Social History   Socioeconomic History  . Marital status: Single    Spouse name: Not on file  . Number of children: Not on file  . Years of education: Not on file  . Highest education level: Not on file  Occupational History  . Occupation: unemployed  Tobacco Use  . Smoking status: Current Every Day Smoker    Types: Cigarettes  . Smokeless tobacco: Never Used  Substance and Sexual Activity  . Alcohol use: No    Alcohol/week: 0.0 standard drinks  . Drug use: Yes    Comment: cocaine,Heroine  . Sexual activity: Yes    Birth control/protection: None  Other Topics Concern  . Not on file  Social History Narrative  . Not on file   Social Determinants of Health   Financial Resource Strain:   . Difficulty of Paying Living Expenses:   Food Insecurity:   . Worried About Programme researcher, broadcasting/film/video in the Last Year:   . Ran  Out of Food in the Last Year:   Transportation Needs:   . Lack of Transportation (Medical):   Marland Kitchen Lack of Transportation (Non-Medical):   Physical Activity:   . Days of Exercise per Week:   . Minutes of Exercise per Session:   Stress:   . Feeling of Stress :   Social Connections:   . Frequency of Communication with Friends and Family:   . Frequency of Social Gatherings with Friends and Family:   . Attends Religious Services:   . Active Member of Clubs or Organizations:   . Attends Archivist  Meetings:   Marland Kitchen Marital Status:    Additional Social History:    Allergies:  No Known Allergies  Labs:  Results for orders placed or performed during the hospital encounter of 08/30/19 (from the past 48 hour(s))  Comprehensive metabolic panel     Status: Abnormal   Collection Time: 08/30/19  6:29 PM  Result Value Ref Range   Sodium 125 (L) 135 - 145 mmol/L   Potassium 3.8 3.5 - 5.1 mmol/L   Chloride 92 (L) 98 - 111 mmol/L   CO2 21 (L) 22 - 32 mmol/L   Glucose, Bld 104 (H) 70 - 99 mg/dL    Comment: Glucose reference range applies only to samples taken after fasting for at least 8 hours.   BUN 22 (H) 6 - 20 mg/dL   Creatinine, Ser 0.91 0.44 - 1.00 mg/dL   Calcium 7.4 (L) 8.9 - 10.3 mg/dL   Total Protein 6.7 6.5 - 8.1 g/dL   Albumin 1.9 (L) 3.5 - 5.0 g/dL   AST 37 15 - 41 U/L   ALT 16 0 - 44 U/L   Alkaline Phosphatase 91 38 - 126 U/L   Total Bilirubin 0.4 0.3 - 1.2 mg/dL   GFR calc non Af Amer >60 >60 mL/min   GFR calc Af Amer >60 >60 mL/min   Anion gap 12 5 - 15    Comment: Performed at [facility], [address] [place], [place] [address]  Lactic acid, plasma     Status: Abnormal   Collection Time: 08/30/19  6:29 PM  Result Value Ref Range   Lactic Acid, Venous 2.7 (HH) 0.5 - 1.9 mmol/L    Comment: CRITICAL RESULT CALLED TO, READ BACK BY AND VERIFIED WITH VANESSA ASHLEY @1924  08/30/19 MJU Performed at [facility], [address] [place], [place] [address]   CBC with Differential     Status: Abnormal   Collection Time: 08/30/19  6:29 PM  Result Value Ref Range   WBC 26.6 (H) 4.0 - 10.5 K/uL   RBC 2.25 (L) 3.87 - 5.11 MIL/uL   Hemoglobin 5.1 (L) 12.0 - 15.0 g/dL    Comment: Reticulocyte Hemoglobin testing may be clinically indicated, consider ordering this additional test [id number]    HCT 15.2 (L) 36.0 - 46.0 %   MCV 67.6 (L) 80.0 - 100.0 fL   MCH 22.7 (L) 26.0 - 34.0 pg   MCHC 33.6 30.0 - 36.0 g/dL   RDW 17.2 (H) 11.5 - 15.5 %   Platelets 268  150 - 400 K/uL   nRBC 0.0 0.0 - 0.2 %   Neutrophils Relative % 83 %   Neutro Abs 21.9 (H) 1.7 - 7.7 K/uL   Lymphocytes Relative 9 %   Lymphs Abs 2.4 0.7 - 4.0 K/uL   Monocytes Relative 5 %   Monocytes Absolute 1.4 (H) 0.1 - 1.0 K/uL   Eosinophils Relative 0 %  Eosinophils Absolute 0.0 0.0 - 0.5 K/uL   Basophils Relative 0 %   Basophils Absolute 0.1 0.0 - 0.1 K/uL   Immature Granulocytes 3 %   Abs Immature Granulocytes 0.76 (H) 0.00 - 0.07 K/uL    Comment: Performed at [facility], [address] [place], [place] [address]  Protime-INR     Status: Abnormal   Collection Time: 08/30/19  6:29 PM  Result Value Ref Range   Prothrombin Time 16.2 (H) 11.4 - 15.2 seconds   INR 1.4 (H) 0.8 - 1.2    Comment: (NOTE) INR goal varies based on device and disease states. Performed at [facility], [address] [place], [place] [address]   Culture, blood (Routine x 2)     Status: None (Preliminary result)   Collection Time: 08/30/19  6:29 PM   Specimen: BLOOD  Result Value Ref Range   Specimen Description BLOOD BLOOD LEFT ARM    Special Requests      BOTTLES DRAWN AEROBIC AND ANAEROBIC Blood Culture adequate volume   Culture  Setup Time      GRAM POSITIVE COCCI IN BOTH AEROBIC AND ANAEROBIC BOTTLES Organism ID to follow CRITICAL RESULT CALLED TO, READ BACK BY AND VERIFIED WITH: SCOTT HALL AT 0713 ON 08/31/19 [facility] Performed at [facility], [address] [place], [place] [address]    Culture GRAM POSITIVE COCCI    Report Status PENDING   ABO/Rh     Status: None   Collection Time: 08/30/19  6:29 PM  Result Value Ref Range   ABO/RH(D)      O POS Performed at [facility], [address] [place], [place] [address]   Blood Culture ID Panel (Reflexed)     Status: Abnormal   Collection Time: 08/30/19  6:29 PM  Result Value Ref Range   Enterococcus species NOT DETECTED NOT DETECTED   Listeria monocytogenes NOT DETECTED NOT DETECTED    Staphylococcus species DETECTED (A) NOT DETECTED    Comment: CRITICAL RESULT CALLED TO, READ BACK BY AND VERIFIED WITH: SCOTT HALL AT 0713 ON 08/31/19 [facility]    Staphylococcus aureus (BCID) DETECTED (A) NOT DETECTED    Comment: Methicillin (oxacillin) susceptible Staphylococcus aureus (MSSA). Preferred therapy is anti staphylococcal beta lactam antibiotic (Cefazolin or Nafcillin), unless clinically contraindicated. CRITICAL RESULT CALLED TO, READ BACK BY AND VERIFIED WITH: SCOTT HALL AT 0713 ON 08/31/19 [facility]    Methicillin resistance NOT DETECTED NOT DETECTED   Streptococcus species NOT DETECTED NOT DETECTED   Streptococcus agalactiae NOT DETECTED NOT DETECTED   Streptococcus pneumoniae NOT DETECTED NOT DETECTED   Streptococcus pyogenes NOT DETECTED NOT DETECTED   Acinetobacter baumannii NOT DETECTED NOT DETECTED   Enterobacteriaceae species NOT DETECTED NOT DETECTED   Enterobacter cloacae complex NOT DETECTED NOT DETECTED   Escherichia coli NOT DETECTED NOT DETECTED   Klebsiella oxytoca NOT DETECTED NOT DETECTED   Klebsiella pneumoniae NOT DETECTED NOT DETECTED   Proteus species NOT DETECTED NOT DETECTED   Serratia marcescens NOT DETECTED NOT DETECTED   Haemophilus influenzae NOT DETECTED NOT DETECTED   Neisseria meningitidis NOT DETECTED NOT DETECTED   Pseudomonas aeruginosa NOT DETECTED NOT DETECTED   Candida albicans NOT DETECTED NOT DETECTED   Candida glabrata NOT DETECTED NOT DETECTED   Candida krusei NOT DETECTED NOT DETECTED   Candida parapsilosis NOT DETECTED NOT DETECTED   Candida tropicalis NOT DETECTED NOT DETECTED    Comment: Performed at [facility], [address], [place], [place] [address]  Blood  Culture (routine x 2)     Status: None (Preliminary result)   Collection Time: 08/30/19  6:57 PM   Specimen: BLOOD  Result Value Ref Range   Specimen Description BLOOD BLOOD LEFT ARM    Special Requests      BOTTLES DRAWN AEROBIC AND ANAEROBIC Blood Culture results  may not be optimal due to an inadequate volume of blood received in culture bottles   Culture      NO GROWTH < 12 HOURS Performed at [facility] [address] [place], [place] [address]    Report Status PENDING   Prepare RBC (crossmatch)     Status: None   Collection Time: 08/30/19  7:18 PM  Result Value Ref Range   Order Confirmation      ORDER PROCESSED BY BLOOD BANK Performed at [facility], [address] [place], [place] [address]   Lactic acid, plasma     Status: None   Collection Time: 08/30/19  7:19 PM  Result Value Ref Range   Lactic Acid, Venous 1.4 0.5 - 1.9 mmol/L    Comment: Performed at [facility], [address] [place], [place] [address]  Urinalysis, Complete w Microscopic     Status: Abnormal   Collection Time: 08/30/19  7:19 PM  Result Value Ref Range   Color, Urine YELLOW (A) YELLOW   APPearance CLEAR (A) CLEAR   Specific Gravity, Urine 1.010 1.005 - 1.030   pH 6.0 5.0 - 8.0   Glucose, UA NEGATIVE NEGATIVE mg/dL   Hgb urine dipstick MODERATE (A) NEGATIVE   Bilirubin Urine NEGATIVE NEGATIVE   Ketones, ur NEGATIVE NEGATIVE mg/dL   Protein, ur 914 (A) NEGATIVE mg/dL   Nitrite NEGATIVE NEGATIVE   Leukocytes,Ua SMALL (A) NEGATIVE   RBC / HPF 11-20 0 - 5 RBC/hpf   WBC, UA 11-20 0 - 5 WBC/hpf   Bacteria, UA RARE (A) NONE SEEN   Squamous Epithelial / LPF 0-5 0 - 5    Comment: Performed at [facility], [address], [place], [place] [address]  Respiratory Panel by RT PCR (Flu A&B, Covid) - Nasopharyngeal Swab     Status: None   Collection Time: 08/30/19  7:19 PM   Specimen: Nasopharyngeal Swab  Result Value Ref Range   SARS Coronavirus 2 by RT PCR NEGATIVE NEGATIVE    Comment: (NOTE) SARS-CoV-2 target nucleic acids are NOT DETECTED. The SARS-CoV-2 RNA is generally detectable in upper respiratoy specimens during the acute phase of infection. The lowest concentration of SARS-CoV-2 viral copies this assay can  detect is 131 copies/mL. A negative result does not preclude SARS-Cov-2 infection and should not be used as the sole basis for treatment or other patient management decisions. A negative result may occur with  improper specimen collection/handling, submission of specimen other than nasopharyngeal swab, presence of viral mutation(s) within the areas targeted by this assay, and inadequate number of viral copies (<131 copies/mL). A negative result must be combined with clinical observations, patient history, and epidemiological information. The expected result is Negative. Fact Sheet for Patients:  https://www.moore.com/ Fact Sheet for Healthcare Providers:  https://www.young.biz/ This test is not yet ap proved or cleared by the [place] FDA and  has been authorized for detection and/or diagnosis of SARS-CoV-2 by FDA under an Emergency Use Authorization (EUA). This EUA will remain  in effect (meaning this test can be used) for the duration of the COVID-19 declaration under Section 564(b)(1) of the Act, 21 U.S.C. section 360bbb-3(b)(1), unless  the authorization is terminated or revoked sooner.    Influenza A by PCR NEGATIVE NEGATIVE   Influenza B by PCR NEGATIVE NEGATIVE    Comment: (NOTE) The Xpert Xpress SARS-CoV-2/FLU/RSV assay is intended as an aid in  the diagnosis of influenza from Nasopharyngeal swab specimens and  should not be used as a sole basis for treatment. Nasal washings and  aspirates are unacceptable for Xpert Xpress SARS-CoV-2/FLU/RSV  testing. Fact Sheet for Patients: https://www.moore.com/ Fact Sheet for Healthcare Providers: https://www.young.biz/ This test is not yet approved or cleared by the [place] FDA and  has been authorized for detection and/or diagnosis of SARS-CoV-2 by  FDA under an Emergency Use Authorization (EUA). This EUA will remain  in effect (meaning this test  can be used) for the duration of the  Covid-19 declaration under Section 564(b)(1) of the Act, 21  U.S.C. section 360bbb-3(b)(1), unless the authorization is  terminated or revoked. Performed at [facility], [address] [place], [place] [address]   Pregnancy, urine     Status: None   Collection Time: 08/30/19  7:19 PM  Result Value Ref Range   Preg Test, Ur NEGATIVE NEGATIVE    Comment: Performed at [facility], [address] [place], [place] [address]  Type and screen     Status: None   Collection Time: 08/30/19  8:36 PM  Result Value Ref Range   ABO/RH(D) O POS    Antibody Screen NEG    Sample Expiration 09/02/2019,2359    Unit Number [id number]    Blood Component Type RED CELLS,LR    Unit division 00    Status of Unit ISSUED,FINAL    Transfusion Status OK TO TRANSFUSE    Crossmatch Result      Compatible Performed at [facility], [address] [place], [place] [address]   HIV Antibody (routine testing w rflx)     Status: None   Collection Time: 08/30/19 10:12 PM  Result Value Ref Range   HIV Screen 4th Generation wRfx Non Reactive Non Reactive    Comment: Performed at [facility], [address], [place], [place] [address]  APTT     Status: None   Collection Time: 08/30/19 10:12 PM  Result Value Ref Range   aPTT 33 24 - 36 seconds    Comment: Performed at [facility], [address] [place], [place] [address]  Blood gas, venous     Status: Abnormal   Collection Time: 08/30/19 10:12 PM  Result Value Ref Range   pH, Ven 7.42 7.250 - 7.430   pCO2, Ven 33 (L) 44.0 - 60.0 mmHg   pO2, Ven 33.0 32.0 - 45.0 mmHg   Bicarbonate 21.4 20.0 - 28.0 mmol/L   Acid-base deficit 2.3 (H) 0.0 - 2.0 mmol/L   O2 Saturation 65.1 %   Patient temperature 37.0    Collection site VEIN    Sample type VENOUS     Comment: Performed at [facility], [address] [place], [place] [address]  Procalcitonin - Baseline      Status: None   Collection Time: 08/30/19 10:12 PM  Result Value Ref Range   Procalcitonin 7.36 ng/mL    Comment:        Interpretation: PCT > 2 ng/mL: Systemic infection (sepsis) is likely, unless other causes are known. (NOTE)       Sepsis PCT Algorithm           Lower Respiratory Tract  Infection PCT Algorithm    ----------------------------     ----------------------------         PCT < 0.25 ng/mL                PCT < 0.10 ng/mL         Strongly encourage             Strongly discourage   discontinuation of antibiotics    initiation of antibiotics    ----------------------------     -----------------------------       PCT 0.25 - 0.50 ng/mL            PCT 0.10 - 0.25 ng/mL               OR       >80% decrease in PCT            Discourage initiation of                                            antibiotics      Encourage discontinuation           of antibiotics    ----------------------------     -----------------------------         PCT >= 0.50 ng/mL              PCT 0.26 - 0.50 ng/mL               AND       <80% decrease in PCT              Encourage initiation of                                             antibiotics       Encourage continuation           of antibiotics    ----------------------------     -----------------------------        PCT >= 0.50 ng/mL                  PCT > 0.50 ng/mL               AND         increase in PCT                  Strongly encourage                                      initiation of antibiotics    Strongly encourage escalation           of antibiotics                                     -----------------------------                                           PCT <= 0.25 ng/mL  OR                                        > 80% decrease in PCT                                     Discontinue / Do not initiate                                              antibiotics Performed at [facility], [address] [place], [place] [address]   Magnesium     Status: None   Collection Time: 08/30/19 10:12 PM  Result Value Ref Range   Magnesium 2.0 1.7 - 2.4 mg/dL    Comment: Performed at [facility], [address] [place], [place] [address]  Phosphorus     Status: None   Collection Time: 08/30/19 10:12 PM  Result Value Ref Range   Phosphorus 3.4 2.5 - 4.6 mg/dL    Comment: Performed at [facility], [address] [place], [place] [address]  Ethanol     Status: None   Collection Time: 08/30/19 10:12 PM  Result Value Ref Range   Alcohol, Ethyl (B) <10 <10 mg/dL    Comment: (NOTE) Lowest detectable limit for serum alcohol is 10 mg/dL. For medical purposes only. Performed at [facility], [address] [place], [place] [address]   Troponin I (High Sensitivity)     Status: None   Collection Time: 08/30/19 10:12 PM  Result Value Ref Range   Troponin I (High Sensitivity) 6 <18 ng/L    Comment: (NOTE) Elevated high sensitivity troponin I (hsTnI) values and significant  changes across serial measurements may suggest ACS but many other  chronic and acute conditions are known to elevate hsTnI results.  Refer to the "Links" section for chest pain algorithms and additional  guidance. Performed at [facility], [address] [place], [place] [address]   Glucose, capillary     Status: Abnormal   Collection Time: 08/30/19 11:05 PM  Result Value Ref Range   Glucose-Capillary 101 (H) 70 - 99 mg/dL    Comment: Glucose reference range applies only to samples taken after fasting for at least 8 hours.  MRSA PCR Screening     Status: None   Collection Time: 08/30/19 11:30 PM   Specimen: Nasopharyngeal  Result Value Ref Range   MRSA by PCR NEGATIVE NEGATIVE    Comment:        The GeneXpert MRSA Assay (FDA approved for NASAL specimens only), is one component of a comprehensive MRSA  colonization surveillance program. It is not intended to diagnose MRSA infection nor to guide or monitor treatment for MRSA infections. Performed at [facility], [address] [place], [place] [address]   Procalcitonin     Status: None   Collection Time: 08/31/19  7:34 AM  Result Value Ref Range   Procalcitonin 6.53 ng/mL    Comment:        Interpretation: PCT > 2 ng/mL: Systemic infection (sepsis) is likely, unless other causes are known. (NOTE)       Sepsis PCT Algorithm  Lower Respiratory Tract                                      Infection PCT Algorithm    ----------------------------     ----------------------------         PCT < 0.25 ng/mL                PCT < 0.10 ng/mL         Strongly encourage             Strongly discourage   discontinuation of antibiotics    initiation of antibiotics    ----------------------------     -----------------------------       PCT 0.25 - 0.50 ng/mL            PCT 0.10 - 0.25 ng/mL               OR       >80% decrease in PCT            Discourage initiation of                                            antibiotics      Encourage discontinuation           of antibiotics    ----------------------------     -----------------------------         PCT >= 0.50 ng/mL              PCT 0.26 - 0.50 ng/mL               AND       <80% decrease in PCT              Encourage initiation of                                             antibiotics       Encourage continuation           of antibiotics    ----------------------------     -----------------------------        PCT >= 0.50 ng/mL                  PCT > 0.50 ng/mL               AND         increase in PCT                  Strongly encourage                                      initiation of antibiotics    Strongly encourage escalation           of antibiotics                                     -----------------------------  PCT  <= 0.25 ng/mL                                                 OR                                        > 80% decrease in PCT                                     Discontinue / Do not initiate                                             antibiotics Performed at [facility], [address] [place], [place] [address]   Hemoglobin and hematocrit, blood     Status: Abnormal   Collection Time: 08/31/19  7:34 AM  Result Value Ref Range   Hemoglobin 7.1 (L) 12.0 - 15.0 g/dL    Comment: REPEATED TO VERIFY   HCT 21.0 (L) 36.0 - 46.0 %    Comment: Performed at [facility], [address] [place], [place] [address]  Comprehensive metabolic panel     Status: Abnormal   Collection Time: 08/31/19  7:34 AM  Result Value Ref Range   Sodium 135 135 - 145 mmol/L   Potassium 4.2 3.5 - 5.1 mmol/L   Chloride 104 98 - 111 mmol/L   CO2 21 (L) 22 - 32 mmol/L   Glucose, Bld 105 (H) 70 - 99 mg/dL    Comment: Glucose reference range applies only to samples taken after fasting for at least 8 hours.   BUN 20 6 - 20 mg/dL   Creatinine, Ser 9.14 0.44 - 1.00 mg/dL   Calcium 7.2 (L) 8.9 - 10.3 mg/dL   Total Protein 7.3 6.5 - 8.1 g/dL   Albumin 2.0 (L) 3.5 - 5.0 g/dL   AST 34 15 - 41 U/L   ALT 17 0 - 44 U/L   Alkaline Phosphatase 104 38 - 126 U/L   Total Bilirubin 0.5 0.3 - 1.2 mg/dL   GFR calc non Af Amer >60 >60 mL/min   GFR calc Af Amer >60 >60 mL/min   Anion gap 10 5 - 15    Comment: Performed at [facility], [address], [place], [place] [address]  Magnesium     Status: None   Collection Time: 08/31/19  7:34 AM  Result Value Ref Range   Magnesium 2.0 1.7 - 2.4 mg/dL    Comment: Performed at [facility], [address], [place], [place] [address]  Phosphorus     Status: None   Collection Time: 08/31/19  7:34 AM  Result Value Ref Range   Phosphorus 2.7 2.5 - 4.6 mg/dL    Comment: Performed at [facility], [address] [place], [place]  [address]  Fibrinogen     Status: Abnormal   Collection Time: 08/31/19  7:34 AM  Result Value Ref Range   Fibrinogen 685 (H) 210 - 475 mg/dL    Comment: Performed at [facility], [address] [place], [place] [address]  APTT     Status: None   Collection Time: 08/31/19  7:34 AM  Result Value Ref Range   aPTT 34 24 - 36 seconds    Comment: Performed at [facility], [address] [place], [place] [address]    Current Facility-Administered Medications  Medication Dose Route Frequency Provider Last Rate Last Admin  . 0.9 %  sodium chloride infusion  10 mL/hr Intravenous Once Willy Eddy, MD      . 0.9 %  sodium chloride infusion   Intravenous Continuous Eugenie Norrie, NP 75 mL/hr at 08/31/19 1310 New Bag at 08/31/19 1310  . 0.9 %  sodium chloride infusion  250 mL Intravenous Continuous Eugenie Norrie, NP      . ALPRAZolam Prudy Feeler) tablet 1 mg  1 mg Oral TID Erin Fulling, MD   1 mg at 08/31/19 1129  . ceFAZolin (ANCEF) IVPB 2g/100 mL premix  2 g Intravenous Q8H Hall, Scott A, RPH 200 mL/hr at 08/31/19 0950 2 g at 08/31/19 0950  . Chlorhexidine Gluconate Cloth 2 % PADS 6 each  6 each Topical Daily Erin Fulling, MD   6 each at 08/31/19 0956  . docusate sodium (COLACE) capsule 100 mg  100 mg Oral BID PRN Eugenie Norrie, NP      . enoxaparin (LOVENOX) injection 40 mg  40 mg Subcutaneous Daily Kasa, Kurian, MD      . feeding supplement (ENSURE ENLIVE) (ENSURE ENLIVE) liquid 237 mL  237 mL Oral TID BM Erin Fulling, MD      . fluconazole (DIFLUCAN) tablet 100 mg  100 mg Oral Daily Erin Fulling, MD   100 mg at 08/31/19 1109  . ipratropium-albuterol (DUONEB) 0.5-2.5 (3) MG/3ML nebulizer solution 3 mL  3 mL Nebulization Q6H PRN Eugenie Norrie, NP      . morphine 2 MG/ML injection 2-4 mg  2-4 mg Intravenous Q3H PRN Eugenie Norrie, NP   4 mg at 08/31/19 1306  . ondansetron (ZOFRAN) injection 4 mg  4 mg Intravenous Q6H PRN Eugenie Norrie, NP      . oxyCODONE (Oxy  IR/ROXICODONE) immediate release tablet 10 mg  10 mg Oral Q3H PRN Willy Eddy, MD   10 mg at 08/31/19 0734  . polyethylene glycol (MIRALAX / GLYCOLAX) packet 17 g  17 g Oral Daily PRN Eugenie Norrie, NP      . promethazine (PHENERGAN) injection 25 mg  25 mg Intravenous Q6H PRN Willy Eddy, MD   25 mg at 08/31/19 0900    Her treatment of sepsis and severe illness limit the evaluation  Psychiatric Specialty Exam: Physical Exam  Review of Systems  Blood pressure 112/64, pulse (!) 101, temperature (!) 97 F (36.1 C), temperature source Oral, resp. rate (!) 38, height 5\' 2"  (1.575 m), weight 46 kg, SpO2 100 %.Body mass index is 18.55 kg/m.  General Appearance: Disheveled  Eye Contact:  Poor  Speech:  Garbled  Volume:  Normal  Mood:  Irritable and complaints of pain  Affect:  Restricted  Thought Process:  Goal Directed  Orientation:  Full (Time, Place, and Person)  Thought Content:  Logical  Suicidal Thoughts:  No  Homicidal Thoughts:  No  Memory:  Negative  Judgement:  Poor  Insight:  Lacking  Psychomotor Activity:  Normal  Concentration:  Concentration: Fair  Recall:  Negative  Fund of Knowledge:  Negative  Language:  Negative  Akathisia:  No  Handed:  Ambidextrous  AIMS (if indicated):     Assets:  Others:  None noted  ADL's:  Impaired  Cognition:  Impaired,  Mild  Sleep:      The patient has a long history of IV drug abuse which has highly led to her current medical condition.  She has little interest in receiving treatment.  Her most pressing concern is the pain she is feeling as the opioids washout.  She is not expressing any desire to harm her self or to leave the ICU environment.  Treatment Plan Summary: The patient is receiving opioids which can be delivered on an as-needed basis including both morphine and oxycodone.  Despite this she is still complaining of fairly severe pain.  It may be simpler to simply detox her from the opioids rather than try and  deliver a sufficient quantity that matches her current use.  Given her long-term history and clear evidence that this history has led to medical sequela it is unlikely that we can deliver a high enough amount of opioids to match what she has been taking or that it can be done safely.  I do not see evidence of depression at this point.  She has no agitation and appears to be compliant with care.  Her long-term prognosis is quite poor.  She showed no interest in discussing opioid addiction or the downside of IV drug abuse.  Disposition: No evidence of imminent risk to self or others at present.   Patient does not meet criteria for psychiatric inpatient admission.  Cindy Hazy, MD 08/31/2019 1:56 PM

## 2019-08-31 NOTE — Progress Notes (Signed)
Initial Nutrition Assessment  DOCUMENTATION CODES:   Severe malnutrition in context of social or environmental circumstances  INTERVENTION:  Provide Ensure Enlive po TID, each supplement provides 350 kcal and 20 grams of protein. Patient prefers vanilla.  Monitor magnesium, potassium, and phosphorus daily for at least 3 days, MD to replete as needed, as pt is at risk for refeeding syndrome given severe malnutrition.  NUTRITION DIAGNOSIS:   Severe Malnutrition related to social / environmental circumstances(substance abuse, inadequate oral intake) as evidenced by moderate fat depletion, severe fat depletion, severe muscle depletion.  GOAL:   Patient will meet greater than or equal to 90% of their needs  MONITOR:   PO intake, Supplement acceptance, Labs, Weight trends, I & O's  REASON FOR ASSESSMENT:   Consult Assessment of nutrition requirement/status  ASSESSMENT:   44 year old female with PMHx of substance abuse, asthma, hepatitis C who is admitted with severe sepsis/septic secondary to recent MSSA bacteremia, multifocal PNA likely septic emboli, and endocarditis following leaving [facility] and noncompliance with prescribed antibiotics.   Met with patient at bedside. Stanton Kidney, NP from Palliative Medicine and safety sitter were also present in room. Patient reports she has a decreased appetite and does not want any solid food at this time. Per safety sitter patient has only had milk, Ginger-ale, and juice so far today. Attempted to obtain more details on nutrition history from patient but she would not answer most questions directly. She does report she receives food stamps. However, it is unclear if she is able to get to the store to purchase food with them. She reports her boyfriend is able to grocery shop for her, but would not confirm if he does purchase groceries for her. She was unable to describe what she typically eats at home. Patient reports she is willing to drink  vanilla Ensure. She is unsure if she will be able to eat solid food today but reports she would try if she feels better.  Patient reports her UBW was 135 lbs at one time but she was unsure when she lost weight. According to chart patient was 54.4 kg (119.68 lbs) on 11/23/2015. She was 51.3 kg on 06/15/2018. Patient is now 46 kg (101.41 lbs). She has lost 5.3 kg (10.3% body weight) over > 1 year, which is not significant for time frame.  Medications reviewed and include: Xanax, Diflucan, cefazolin.  Labs reviewed.   NUTRITION - FOCUSED PHYSICAL EXAM:    Most Recent Value  Orbital Region  Moderate depletion  Upper Arm Region  Severe depletion  Thoracic and Lumbar Region  Severe depletion  Buccal Region  Moderate depletion  Temple Region  Severe depletion  Clavicle Bone Region  Severe depletion  Clavicle and Acromion Bone Region  Severe depletion  Scapular Bone Region  Unable to assess  Dorsal Hand  Moderate depletion  Patellar Region  Severe depletion  Anterior Thigh Region  Severe depletion  Posterior Calf Region  Severe depletion  Edema (RD Assessment)  Mild [right upper extremity]  Hair  Reviewed  Eyes  Unable to assess  Mouth  Unable to assess  Skin  Reviewed  Nails  Reviewed     Diet Order:   Diet Order            Diet regular Room service appropriate? Yes; Fluid consistency: Thin  Diet effective now             EDUCATION NEEDS:   No education needs have been identified at this  time  Skin:  Skin Assessment: Reviewed RN Assessment  Last BM:  Unknown  Height:   Ht Readings from Last 1 Encounters:  08/30/19 _0  (1.575 m)   Weight:   Wt Readings from Last 1 Encounters:  08/31/19 46 kg   Ideal Body Weight:  50 kg  BMI:  Body mass index is 18.55 kg/m.  Estimated Nutritional Needs:   Kcal:  1400-1600  Protein:  70-80 grams  Fluid:  1.4-1.6 L/day  Jacklynn Barnacle, MS, RD, LDN Pager number available on Amion

## 2019-08-31 NOTE — Progress Notes (Signed)
Pharmacy Antibiotic Note  Andrea King is a 44 y.o. female admitted on 08/30/2019 with sepsis.  Pharmacy has been consulted for Cefepime and Vancomycin dosing.  Plan: Cefepime 2gm IV q8hrs  Vancomycin 1000 mg IV Q 24 hrs. Goal AUC 400-550. Expected AUC: 549.7, Css min 11.5 SCr used: 0.91   Height: 5\' 2"  (157.5 cm) Weight: 45.4 kg (100 lb) IBW/kg (Calculated) : 50.1  Temp (24hrs), Avg:98.6 F (37 C), Min:97 F (36.1 C), Max:101.5 F (38.6 C)  Recent Labs  Lab 08/30/19 1829 08/30/19 1919  WBC 26.6*  --   CREATININE 0.91  --   LATICACIDVEN 2.7* 1.4    Estimated Creatinine Clearance: 56 mL/min (by C-G formula based on SCr of 0.91 mg/dL).    No Known Allergies  Antimicrobials this admission:   >>    >>   Dose adjustments this admission:   Microbiology results:  BCx:   UCx:    Sputum:    MRSA PCR:   Thank you for allowing pharmacy to be a part of this patient's care.  09/01/19 A 08/31/2019 12:57 AM

## 2019-09-01 ENCOUNTER — Telehealth: Payer: Self-pay | Admitting: Pharmacist

## 2019-09-01 DIAGNOSIS — I071 Rheumatic tricuspid insufficiency: Secondary | ICD-10-CM

## 2019-09-01 DIAGNOSIS — D649 Anemia, unspecified: Secondary | ICD-10-CM

## 2019-09-01 DIAGNOSIS — A4101 Sepsis due to Methicillin susceptible Staphylococcus aureus: Secondary | ICD-10-CM

## 2019-09-01 DIAGNOSIS — M4627 Osteomyelitis of vertebra, lumbosacral region: Secondary | ICD-10-CM

## 2019-09-01 DIAGNOSIS — I079 Rheumatic tricuspid valve disease, unspecified: Secondary | ICD-10-CM

## 2019-09-01 DIAGNOSIS — F1123 Opioid dependence with withdrawal: Secondary | ICD-10-CM

## 2019-09-01 LAB — CBC WITH DIFFERENTIAL/PLATELET
Abs Immature Granulocytes: 0.37 10*3/uL — ABNORMAL HIGH (ref 0.00–0.07)
Basophils Absolute: 0.1 10*3/uL (ref 0.0–0.1)
Basophils Relative: 0 %
Eosinophils Absolute: 0.1 10*3/uL (ref 0.0–0.5)
Eosinophils Relative: 0 %
HCT: 17.4 % — ABNORMAL LOW (ref 36.0–46.0)
Hemoglobin: 5.8 g/dL — ABNORMAL LOW (ref 12.0–15.0)
Immature Granulocytes: 2 %
Lymphocytes Relative: 11 %
Lymphs Abs: 2.5 10*3/uL (ref 0.7–4.0)
MCH: 23.4 pg — ABNORMAL LOW (ref 26.0–34.0)
MCHC: 33.3 g/dL (ref 30.0–36.0)
MCV: 70.2 fL — ABNORMAL LOW (ref 80.0–100.0)
Monocytes Absolute: 1.2 10*3/uL — ABNORMAL HIGH (ref 0.1–1.0)
Monocytes Relative: 5 %
Neutro Abs: 18.1 10*3/uL — ABNORMAL HIGH (ref 1.7–7.7)
Neutrophils Relative %: 82 %
Platelets: 227 10*3/uL (ref 150–400)
RBC: 2.48 MIL/uL — ABNORMAL LOW (ref 3.87–5.11)
RDW: 18.6 % — ABNORMAL HIGH (ref 11.5–15.5)
WBC: 22.2 10*3/uL — ABNORMAL HIGH (ref 4.0–10.5)
nRBC: 0 % (ref 0.0–0.2)

## 2019-09-01 LAB — COMPREHENSIVE METABOLIC PANEL
ALT: 9 U/L (ref 0–44)
AST: 21 U/L (ref 15–41)
Albumin: 1.6 g/dL — ABNORMAL LOW (ref 3.5–5.0)
Alkaline Phosphatase: 88 U/L (ref 38–126)
Anion gap: 8 (ref 5–15)
BUN: 15 mg/dL (ref 6–20)
CO2: 21 mmol/L — ABNORMAL LOW (ref 22–32)
Calcium: 7.4 mg/dL — ABNORMAL LOW (ref 8.9–10.3)
Chloride: 104 mmol/L (ref 98–111)
Creatinine, Ser: 0.7 mg/dL (ref 0.44–1.00)
GFR calc Af Amer: >60 mL/min (ref >60)
GFR calc non Af Amer: >60 mL/min (ref >60)
Glucose, Bld: 89 mg/dL (ref 70–99)
Potassium: 4 mmol/L (ref 3.5–5.1)
Sodium: 133 mmol/L — ABNORMAL LOW (ref 135–145)
Total Bilirubin: 0.3 mg/dL (ref 0.3–1.2)
Total Protein: 6 g/dL — ABNORMAL LOW (ref 6.5–8.1)

## 2019-09-01 LAB — PREPARE RBC (CROSSMATCH)

## 2019-09-01 LAB — PHOSPHORUS: Phosphorus: 2.7 mg/dL (ref 2.5–4.6)

## 2019-09-01 LAB — PROCALCITONIN: Procalcitonin: 5.06 ng/mL

## 2019-09-01 LAB — MAGNESIUM: Magnesium: 1.9 mg/dL (ref 1.7–2.4)

## 2019-09-01 LAB — LACTATE DEHYDROGENASE: LDH: 169 U/L (ref 98–192)

## 2019-09-01 MED ORDER — MAGNESIUM SULFATE IN D5W 1-5 GM/100ML-% IV SOLN
1.0000 g | Freq: Once | INTRAVENOUS | Status: AC
  Administered 2019-09-01: 1 g via INTRAVENOUS
  Filled 2019-09-01: qty 100

## 2019-09-01 MED ORDER — CHLORHEXIDINE GLUCONATE CLOTH 2 % EX PADS: 6.0000 | MEDICATED_PAD | Freq: Every day | CUTANEOUS | Status: DC

## 2019-09-01 MED ORDER — SODIUM CHLORIDE 0.9% FLUSH: 10.0000 mL | INTRAVENOUS | Status: DC | PRN

## 2019-09-01 MED ORDER — METHADONE HCL 10 MG PO TABS: 10.0000 mg | ORAL_TABLET | Freq: Every day | ORAL | 0 refills | Status: DC

## 2019-09-01 MED ORDER — ENSURE ENLIVE PO LIQD: 237.0000 mL | Freq: Three times a day (TID) | ORAL | 12 refills | Status: DC

## 2019-09-01 MED ORDER — OXYCODONE HCL 10 MG PO TABS: 10.0000 mg | ORAL_TABLET | ORAL | 0 refills | Status: DC | PRN

## 2019-09-01 MED ORDER — ONDANSETRON HCL 4 MG/2ML IJ SOLN: 4.0000 mg | Freq: Four times a day (QID) | INTRAMUSCULAR | 0 refills | Status: DC | PRN

## 2019-09-01 MED ORDER — MORPHINE SULFATE (PF) 2 MG/ML IV SOLN: 2.0000 mg | INTRAVENOUS | 0 refills | Status: DC | PRN

## 2019-09-01 MED ORDER — NAFCILLIN IV (FOR PTA / DISCHARGE USE ONLY): 12.0000 g | INTRAVENOUS | Status: DC

## 2019-09-01 MED ORDER — SODIUM CHLORIDE 0.9 % IV SOLN
12.0000 g | INTRAVENOUS | Status: DC
  Administered 2019-09-01: 12 g via INTRAVENOUS
  Filled 2019-09-01: qty 12000

## 2019-09-01 MED ORDER — SODIUM CHLORIDE 0.9% FLUSH: 10.0000 mL | Freq: Two times a day (BID) | INTRAVENOUS | Status: DC

## 2019-09-01 MED ORDER — DOCUSATE SODIUM 100 MG PO CAPS: 100.0000 mg | ORAL_CAPSULE | Freq: Two times a day (BID) | ORAL | 0 refills | Status: DC | PRN

## 2019-09-01 MED ORDER — SODIUM CHLORIDE 0.9 % IV SOLN: 75.0000 mL | INTRAVENOUS | 0 refills | Status: DC

## 2019-09-01 MED ORDER — IPRATROPIUM-ALBUTEROL 0.5-2.5 (3) MG/3ML IN SOLN: 3.0000 mL | Freq: Four times a day (QID) | RESPIRATORY_TRACT | Status: DC | PRN

## 2019-09-01 MED ORDER — PROMETHAZINE HCL 25 MG/ML IJ SOLN: 25.0000 mg | Freq: Four times a day (QID) | INTRAMUSCULAR | 0 refills | Status: DC | PRN

## 2019-09-01 MED ORDER — SIMETHICONE 80 MG PO CHEW: 80.0000 mg | CHEWABLE_TABLET | Freq: Four times a day (QID) | ORAL | 0 refills | Status: DC | PRN

## 2019-09-01 MED ORDER — FLUCONAZOLE 100 MG PO TABS: 100.0000 mg | ORAL_TABLET | Freq: Every day | ORAL | Status: DC

## 2019-09-01 MED ORDER — ACETAMINOPHEN 325 MG PO TABS: 650.0000 mg | ORAL_TABLET | Freq: Four times a day (QID) | ORAL | Status: DC | PRN

## 2019-09-01 MED ORDER — OXYCODONE HCL ER 10 MG PO T12A: 10.0000 mg | EXTENDED_RELEASE_TABLET | Freq: Two times a day (BID) | ORAL | Status: DC

## 2019-09-01 MED ORDER — ALPRAZOLAM 1 MG PO TABS: 1.0000 mg | ORAL_TABLET | Freq: Three times a day (TID) | ORAL | 0 refills | Status: DC

## 2019-09-01 MED ORDER — SODIUM CHLORIDE 0.9 % IV SOLN: 250.0000 mL | INTRAVENOUS | 0 refills | Status: DC

## 2019-09-01 MED ORDER — METHADONE HCL 10 MG PO TABS
10.0000 mg | ORAL_TABLET | Freq: Every day | ORAL | Status: DC
  Administered 2019-09-01: 10 mg via ORAL
  Filled 2019-09-01: qty 1

## 2019-09-01 MED ORDER — SODIUM CHLORIDE 0.9% IV SOLUTION: Freq: Once | INTRAVENOUS | Status: AC

## 2019-09-01 MED ORDER — SODIUM CHLORIDE 0.9 % IV SOLN: 12.0000 g | INTRAVENOUS | Status: DC

## 2019-09-01 MED ORDER — POLYETHYLENE GLYCOL 3350 17 G PO PACK: 17.0000 g | PACK | Freq: Every day | ORAL | 0 refills | Status: DC | PRN

## 2019-09-01 MED ORDER — ENOXAPARIN SODIUM 40 MG/0.4ML ~~LOC~~ SOLN: 40.0000 mg | Freq: Every day | SUBCUTANEOUS | Status: DC

## 2019-09-01 NOTE — Discharge Summary (Addendum)
Physician Discharge Summary  Andrea King CBU:[id number] DOB: 10/23/1975 DOA: 08/30/2019  PCP: [name], CNM  Admit date: 08/30/2019 Discharge date: 09/01/2019  Recommendations for Outpatient Follow-up:  1. Transfer to [facility] for consideration of surgical valvular repair for tricuspid myxoma/vegetations.   Discharge Diagnoses: Principal diagnosis is #1 1. MSSA endocarditis with Tricuspid valve vegetations. 2. Hypotension 3. MSSA Bacteremia 4. Opiate dependence 5. Cocaine abuse 6. Severe anemia 7. 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 She was admitted to [facility] on 08/05/2019 with diagnosis of sepsis secondary to MSSA bacteremia and started on broad spectrum abx, TTE negative for valvular vegetations, TEE without valvular vegetations however there was evidence of right intra atrial vegetation.  Unfortunately the pt left AMA before CT surgery could be consulted, she was discharged on oral doxycycline 100 mg bid but advised this was not sufficient treatment for endocarditis and still left AMA.   During current ER presentation she presented to [facility] ER on 04/28 with  fever, malaise, and stating "I have infection everywhere." Per ER notes the pt stated she has not been taking her antibiotics as prescribed due to financial reasons.  Lab results revealed Na+ 125, CO2 21, glucose 104, BUN 22, calcium 7.4, albumin 1.9, lactic acid 2.7, wbc 26.6, hgb 5.1, pt 16.2, inr 1.4, and pct 7.36.  COVID-19/ Influenza PCR negative and UA revealed possible UTI. CXR concerning multifocal pulmonary opacities, which appear cavitary consistent with septic emboli in the setting of recent endocarditis. She received flagyl, cefepime, vancomycin, 2.5L NS bolus and 1 unit of pRBC's ordered.  ER physician attempted to transfer pt to [facility] however no beds were available.  ER physician discussed pts case in consultation with Cardiothoracic Surgeon Dr. Wallace Going who recommended [facility] admission for medical management for now, and pt transfer could be considered if pt develops signs of worsening valve failure. Unfortunately while undergoing treatment in the ER pts RN found multiple syringes with a dark substance and caps hidden in her bilateral groins.  Upon further evaluation ER physician concerned due to pts chronic encephalopathy she could not make appropriate decisions regarding her care, and pt continued to threaten she would leave against medical advise.  Therefore, ER physician involuntarily committed Andrea King. ER physician subsequently contacted PCCM for ICU admission and due to continued hypotension peripheral levophed gtt initiated.    PAST MEDICAL HISTORY :   has a past medical history of Anemia, Asthma, DJD (degenerative joint disease), Hepatitis C, and Substance abuse ([place]).  has a past surgical history that includes Tumor excision; Inguinal hernia repair (Left, 04/28/2013); TEE without cardioversion (N/A, 11/18/2015); TEE without cardioversion (N/A, 12/31/2017); and TEE without cardioversion (N/A, 01/04/2018).  Hospital Course:  44 yo female with hx of IVDU and  opiate drug use along with cocaine abuse admitted with severe  anemia without evidence of active bleeding, septic/hypovolemia shock secondary torecent MSSA bacteremia dx, multifocal pneumonia likely septic emboli, and endocarditis following leaving [facility] and prescribed abx noncompliance  The patient has been admitted to ICU on a levophed gtt. She was initially IVC'd. However, she was evaluated ID has been consulted. The patient is receiving IV nafcillin. She has had ongoing pain control issues. PO methadone was added today for pain control and to avoid withdrawal symptoms as the patient states that she is allergic to suboxone. On 08/31/2019 Dr. Mortimer Fries discussed the patient with [facility]. She will be transferred there when a bed becomes available.   Today's assessment: S: The patient is resting in bed. She is complaining bitterly of pain. She appears chronically and acutely ill. O: Vitals:  Vitals:   09/01/19 2006 09/01/19 2010  BP: 107/74 107/74  Pulse: (!) 112   Resp: (!) 37   Temp: 98.4 F (36.9 C) 98.4 F (36.9 C)  SpO2:  97%   Exam:  Constitutional:  . The patient is awake, alert, and oriented x 3. Moderate distress from pain. Respiratory:  . No increased work of breathing. . No wheezes, rales, or rhonchi . No tactile fremitus Cardiovascular:  . Regular rate and rhythm . No murmurs, ectopy, or gallups. . No lateral PMI. No thrills. Abdomen:  . Abdomen is soft, non-tender, non-distended . No hernias, masses, or organomegaly . Normoactive bowel sounds.  Musculoskeletal:  . No cyanosis, clubbing, or edema Skin:  . No rashes, lesions, ulcers . palpation of skin: no induration or nodules Neurologic:  . CN 2-12 intact . Sensation all 4 extremities intact Psychiatric:  . Mental status o Mood, affect appropriate o Orientation to person, place, time  . judgment and insight appear intact  Discharge Instructions   Transfer to [facility]. Allergies  as of 09/01/2019   No Known Allergies     Medication List    TAKE these medications   acetaminophen 325 MG tablet Commonly known as: TYLENOL Take 2 tablets (650 mg total) by mouth every 6 (six) hours as needed for mild pain or fever.   ALPRAZolam 1 MG tablet Commonly known as: XANAX Take 1 tablet (1 mg total) by mouth 3 (three) times daily.   Chlorhexidine Gluconate Cloth 2 % Pads Apply 6 each topically daily. Start taking on: Sep 02, 2019   docusate sodium 100 MG capsule Commonly known as: COLACE Take 1 capsule (100 mg total) by mouth 2 (two) times daily as needed for mild constipation.   enoxaparin 40 MG/0.4ML injection Commonly known as: LOVENOX Inject 0.4 mLs (40 mg total) into the skin daily. Start taking on: Sep 02, 2019   feeding supplement (ENSURE ENLIVE) Liqd Take 237 mLs by mouth 3 (three) times daily between meals. Start taking on: Sep 02, 2019   fluconazole 100 MG tablet Commonly known as: DIFLUCAN Take 1 tablet (100 mg total) by mouth daily. Start taking on: Sep 02, 2019   ipratropium-albuterol 0.5-2.5 (3) MG/3ML Soln Commonly known as: DUONEB Take 3 mLs by nebulization every 6 (six) hours as needed.   methadone 10 MG tablet Commonly known as: DOLOPHINE Take 1 tablet (10 mg total) by mouth daily. Start taking on: Sep 02, 2019   morphine 2 MG/ML injection Inject 1-2 mLs (2-4 mg total) into the vein every 3 (three) hours as needed.   nafcillin  IVPB Inject 12 g into the vein daily. Indication:  MSSA Bacteremia, Endocarditis First Dose: 09/01/2019 Last Day  of Therapy:  To be determined by Infectious Disease Labs - Once weekly:  CBC/D and BMP, Labs - Every other week:  ESR and CRP Method of administration: Elastomeric (Continuous infusion) Method of administration may be changed at the discretion of home infusion pharmacist based upon assessment of the patient and/or caregiver's ability to self-administer the medication ordered.   ondansetron 4 MG/2ML Soln  injection Commonly known as: ZOFRAN Inject 2 mLs (4 mg total) into the vein every 6 (six) hours as needed for nausea.   Oxycodone HCl 10 MG Tabs Take 1 tablet (10 mg total) by mouth every 3 (three) hours as needed for severe pain.   polyethylene glycol 17 g packet Commonly known as: MIRALAX / GLYCOLAX Take 17 g by mouth daily as needed for moderate constipation.   promethazine 25 MG/ML injection Commonly known as: PHENERGAN Inject 1 mL (25 mg total) into the vein every 6 (six) hours as needed for nausea or vomiting.   simethicone 80 MG chewable tablet Commonly known as: MYLICON Chew 1 tablet (80 mg total) by mouth every 6 (six) hours as needed for flatulence.   sodium chloride 0.9 % infusion Inject 75 mLs into the vein continuous.   sodium chloride 0.9 % infusion Inject 250 mLs into the vein continuous.   sodium chloride 0.9 % SOLN 452 mL with nafcillin 2 g SOLR 12 g Inject 12 g into the vein daily. Start taking on: Sep 02, 2019            Discharge Care Instructions  (From admission, onward)         Start     Ordered   09/01/19 0000  Change dressing on IV access line weekly and PRN  (Home infusion instructions - [facility] )     09/01/19 2104          No Known Allergies  The results of significant diagnostics from this hospitalization (including imaging, microbiology, ancillary and laboratory) are listed below for reference.    Significant Diagnostic Studies: DG Chest 1 View  Result Date: 08/30/2019 CLINICAL DATA:  Sepsis. Patient states infection in arm, hand, and endocarditis. Recent hospitalization for endocarditis. EXAM: CHEST  1 VIEW COMPARISON:  Most recent radiograph 06/16/2018, no interval chest imaging. FINDINGS: Multifocal patchy and nodular opacities throughout both lungs, some of which appear cavitary. Post median sternotomy. Prosthetic cardiac valve. Heart is normal in size. Normal mediastinal contours. No pneumothorax or large pleural  effusion. Multiple surgical clips project over the left hemithorax. Surgical hardware in the lower cervical spine is partially included. IMPRESSION: Multifocal pulmonary opacities, some of which appear cavitary, most consistent with septic emboli in the setting of recent endocarditis. Electronically Signed   By: Keith Rake M.D.   On: 08/30/2019 19:23   ECHOCARDIOGRAM COMPLETE  Result Date: 08/31/2019    ECHOCARDIOGRAM REPORT   Patient Name:   Andrea King Date of Exam: 08/31/2019 Medical Rec #:  [id number]       Height:       62.0 in Accession #:    [id number]      Weight:       101.4 lb Date of Birth:  [birth year]      BSA:          1.432 m Patient Age:    30 years        BP:           112/64 mmHg Patient Gender: F  HR:           140 bpm. Exam Location:  [facility] Procedure: 2D Echo, Color Doppler and Cardiac Doppler Indications:     I38 Endocarditis  History:         Patient has prior history of Echocardiogram examinations.                  Substance abuse; hepatitis C.  Sonographer:     Charmayne Sheer RDCS (AE) Referring Phys:  [id number] Awilda Bill Diagnosing Phys: Neoma Laming MD  Sonographer Comments: Image acquisition challenging due to uncooperative patient. IMPRESSIONS  1. Left ventricular ejection fraction, by estimation, is 60 to 65%. The left ventricle has normal function. The left ventricle has no regional wall motion abnormalities. Left ventricular diastolic parameters are consistent with Grade I diastolic dysfunction (impaired relaxation).  2. Right ventricular systolic function is normal. The right ventricular size is normal. There is normal pulmonary artery systolic pressure.  3. Left atrial size was mildly dilated.  4. Right atrial size was mildly dilated.  5. The mitral valve is normal in structure. No evidence of mitral valve regurgitation. No evidence of mitral stenosis.  6. Vegetation on tip of leaflets of TV, 1.2x.5 cm. The tricuspid valve is myxomatous. Tricuspid valve  regurgitation is moderate.  7. The aortic valve is normal in structure. Aortic valve regurgitation is not visualized. No aortic stenosis is present.  8. The inferior vena cava is normal in size with greater than 50% respiratory variability, suggesting right atrial pressure of 3 mmHg. Comparison(s): Vegetation on tricuspid valve. FINDINGS  Left Ventricle: Left ventricular ejection fraction, by estimation, is 60 to 65%. The left ventricle has normal function. The left ventricle has no regional wall motion abnormalities. The left ventricular internal cavity size was normal in size. There is  no left ventricular hypertrophy. Left ventricular diastolic parameters are consistent with Grade I diastolic dysfunction (impaired relaxation). Right Ventricle: The right ventricular size is normal. No increase in right ventricular wall thickness. Right ventricular systolic function is normal. There is normal pulmonary artery systolic pressure. The tricuspid regurgitant velocity is 2.48 m/s, and  with an assumed right atrial pressure of 10 mmHg, the estimated right ventricular systolic pressure is 09.8 mmHg. Left Atrium: Left atrial size was mildly dilated. Right Atrium: Right atrial size was mildly dilated. Pericardium: There is no evidence of pericardial effusion. Mitral Valve: The mitral valve is normal in structure. Normal mobility of the mitral valve leaflets. No evidence of mitral valve regurgitation. No evidence of mitral valve stenosis. MV peak gradient, 5.0 mmHg. The mean mitral valve gradient is 3.0 mmHg. Tricuspid Valve: Vegetation on tip of leaflets of TV, 1.2x.5 cm. The tricuspid valve is myxomatous. Tricuspid valve regurgitation is moderate . No evidence of tricuspid stenosis. Aortic Valve: The aortic valve is normal in structure. Aortic valve regurgitation is not visualized. No aortic stenosis is present. Aortic valve mean gradient measures 9.0 mmHg. Aortic valve peak gradient measures 16.0 mmHg. Aortic valve area,  by VTI measures 2.54 cm. Pulmonic Valve: The pulmonic valve was normal in structure. Pulmonic valve regurgitation is not visualized. No evidence of pulmonic stenosis. Aorta: The aortic root is normal in size and structure. Venous: The inferior vena cava is normal in size with greater than 50% respiratory variability, suggesting right atrial pressure of 3 mmHg. IAS/Shunts: No atrial level shunt detected by color flow Doppler.  LEFT VENTRICLE PLAX 2D LVIDd:         4.33 cm  Diastology LVIDs:  2.83 cm  LV e' lateral:   19.60 cm/s LV PW:         0.93 cm  LV E/e' lateral: 3.5 LV IVS:        0.66 cm  LV e' medial:    10.10 cm/s LVOT diam:     1.90 cm  LV E/e' medial:  6.7 LV SV:         68 LV SV Index:   48 LVOT Area:     2.84 cm  RIGHT VENTRICLE RV Basal diam:  2.63 cm LEFT ATRIUM             Index       RIGHT ATRIUM           Index LA diam:        3.40 cm 2.37 cm/m  RA Area:     13.30 cm LA Vol (A2C):   27.5 ml 19.20 ml/m RA Volume:   31.00 ml  21.64 ml/m LA Vol (A4C):   31.2 ml 21.78 ml/m LA Biplane Vol: 30.5 ml 21.29 ml/m  AORTIC VALVE                    PULMONIC VALVE AV Area (Vmax):    2.54 cm     PV Vmax:       1.34 m/s AV Area (Vmean):   2.45 cm     PV Vmean:      87.100 cm/s AV Area (VTI):     2.54 cm     PV VTI:        0.192 m AV Vmax:           200.00 cm/s  PV Peak grad:  7.2 mmHg AV Vmean:          140.000 cm/s PV Mean grad:  4.0 mmHg AV VTI:            0.269 m AV Peak Grad:      16.0 mmHg AV Mean Grad:      9.0 mmHg LVOT Vmax:         179.00 cm/s LVOT Vmean:        121.000 cm/s LVOT VTI:          0.241 m LVOT/AV VTI ratio: 0.90  AORTA Ao Root diam: 2.70 cm MITRAL VALVE               TRICUSPID VALVE MV Area (PHT): 6.71 cm    TR Peak grad:   24.6 mmHg MV Peak grad:  5.0 mmHg    TR Vmax:        248.00 cm/s MV Mean grad:  3.0 mmHg MV Vmax:       1.12 m/s    SHUNTS MV Vmean:      82.0 cm/s   Systemic VTI:  0.24 m MV Decel Time: 113 msec    Systemic Diam: 1.90 cm MV E velocity: 67.90 cm/s MV A  velocity: 97.00 cm/s MV E/A ratio:  0.70 Neoma Laming MD Electronically signed by Neoma Laming MD Signature Date/Time: 08/31/2019/10:06:33 AM    Final     Microbiology: Recent Results (from the past 240 hour(s))  Culture, blood (Routine x 2)     Status: Abnormal (Preliminary result)   Collection Time: 08/30/19  6:29 PM   Specimen: BLOOD  Result Value Ref Range Status   Specimen Description   Final    BLOOD BLOOD LEFT ARM Performed at [facility], [address] [place], [place]  [address]    Special Requests   Final    BOTTLES DRAWN AEROBIC AND ANAEROBIC Blood Culture adequate volume Performed at [facility], [address] [place], [place] [address]    Culture  Setup Time   Final    GRAM POSITIVE COCCI IN BOTH AEROBIC AND ANAEROBIC BOTTLES CRITICAL RESULT CALLED TO, READ BACK BY AND VERIFIED WITH: [place] AT 2505 ON 08/31/19 SNG Performed at [facility], [place] [address], [place] [address]    Culture STAPHYLOCOCCUS AUREUS (A)  Final   Report Status PENDING  Incomplete  Blood Culture ID Panel (Reflexed)     Status: Abnormal   Collection Time: 08/30/19  6:29 PM  Result Value Ref Range Status   Enterococcus species NOT DETECTED NOT DETECTED Final   Listeria monocytogenes NOT DETECTED NOT DETECTED Final   Staphylococcus species DETECTED (A) NOT DETECTED Final    Comment: CRITICAL RESULT CALLED TO, READ BACK BY AND VERIFIED WITH: SCOTT HALL AT 0713 ON 08/31/19 SNG    Staphylococcus aureus (BCID) DETECTED (A) NOT DETECTED Final    Comment: Methicillin (oxacillin) susceptible Staphylococcus aureus (MSSA). Preferred therapy is anti staphylococcal beta lactam antibiotic (Cefazolin or Nafcillin), unless clinically contraindicated. CRITICAL RESULT CALLED TO, READ BACK BY AND VERIFIED WITH: SCOTT HALL AT 0713 ON 08/31/19 SNG    Methicillin resistance NOT DETECTED NOT DETECTED Final   Streptococcus species NOT DETECTED NOT DETECTED Final    Streptococcus agalactiae NOT DETECTED NOT DETECTED Final   Streptococcus pneumoniae NOT DETECTED NOT DETECTED Final   Streptococcus pyogenes NOT DETECTED NOT DETECTED Final   Acinetobacter baumannii NOT DETECTED NOT DETECTED Final   Enterobacteriaceae species NOT DETECTED NOT DETECTED Final   Enterobacter cloacae complex NOT DETECTED NOT DETECTED Final   Escherichia coli NOT DETECTED NOT DETECTED Final   Klebsiella oxytoca NOT DETECTED NOT DETECTED Final   Klebsiella pneumoniae NOT DETECTED NOT DETECTED Final   Proteus species NOT DETECTED NOT DETECTED Final   Serratia marcescens NOT DETECTED NOT DETECTED Final   Haemophilus influenzae NOT DETECTED NOT DETECTED Final   Neisseria meningitidis NOT DETECTED NOT DETECTED Final   Pseudomonas aeruginosa NOT DETECTED NOT DETECTED Final   Candida albicans NOT DETECTED NOT DETECTED Final   Candida glabrata NOT DETECTED NOT DETECTED Final   Candida krusei NOT DETECTED NOT DETECTED Final   Candida parapsilosis NOT DETECTED NOT DETECTED Final   Candida tropicalis NOT DETECTED NOT DETECTED Final    Comment: Performed at [facility], [address] [place], [place] [address]  Blood Culture (routine x 2)     Status: None (Preliminary result)   Collection Time: 08/30/19  6:57 PM   Specimen: BLOOD  Result Value Ref Range Status   Specimen Description BLOOD BLOOD LEFT ARM  Final   Special Requests   Final    BOTTLES DRAWN AEROBIC AND ANAEROBIC Blood Culture results may not be optimal due to an inadequate volume of blood received in culture bottles   Culture   Final    NO GROWTH 2 DAYS Performed at [facility], [address], [place], [place] [address]    Report Status PENDING  Incomplete  Urine culture     Status: Abnormal   Collection Time: 08/30/19  7:19 PM   Specimen: In/Out Cath Urine  Result Value Ref Range Status   Specimen Description   Final    IN/OUT CATH URINE Performed at [facility], [address] [place], [place] [address]  Special Requests   Final    NONE Performed at [facility], [address] [place], [place] [address]    Culture MULTIPLE SPECIES PRESENT, SUGGEST RECOLLECTION (A)  Final   Report Status 08/31/2019 FINAL  Final  Respiratory Panel by RT PCR (Flu A&B, Covid) - Nasopharyngeal Swab     Status: None   Collection Time: 08/30/19  7:19 PM   Specimen: Nasopharyngeal Swab  Result Value Ref Range Status   SARS Coronavirus 2 by RT PCR NEGATIVE NEGATIVE Final    Comment: (NOTE) SARS-CoV-2 target nucleic acids are NOT DETECTED. The SARS-CoV-2 RNA is generally detectable in upper respiratoy specimens during the acute phase of infection. The lowest concentration of SARS-CoV-2 viral copies this assay can detect is 131 copies/mL. A negative result does not preclude SARS-Cov-2 infection and should not be used as the sole basis for treatment or other patient management decisions. A negative result may occur with  improper specimen collection/handling, submission of specimen other than nasopharyngeal swab, presence of viral mutation(s) within the areas targeted by this assay, and inadequate number of viral copies (<131 copies/mL). A negative result must be combined with clinical observations, patient history, and epidemiological information. The expected result is Negative. Fact Sheet for Patients:  PinkCheek.be Fact Sheet for Healthcare Providers:  GravelBags.it This test is not yet ap proved or cleared by the [place] FDA and  has been authorized for detection and/or diagnosis of SARS-CoV-2 by FDA under an Emergency Use Authorization (EUA). This EUA will remain  in effect (meaning this test can be used) for the duration of the COVID-19 declaration under Section 564(b)(1) of the Act, 21 U.S.C. section 360bbb-3(b)(1), unless the authorization is terminated or revoked sooner.    Influenza A by PCR  NEGATIVE NEGATIVE Final   Influenza B by PCR NEGATIVE NEGATIVE Final    Comment: (NOTE) The Xpert Xpress SARS-CoV-2/FLU/RSV assay is intended as an aid in  the diagnosis of influenza from Nasopharyngeal swab specimens and  should not be used as a sole basis for treatment. Nasal washings and  aspirates are unacceptable for Xpert Xpress SARS-CoV-2/FLU/RSV  testing. Fact Sheet for Patients: PinkCheek.be Fact Sheet for Healthcare Providers: GravelBags.it This test is not yet approved or cleared by the [place] FDA and  has been authorized for detection and/or diagnosis of SARS-CoV-2 by  FDA under an Emergency Use Authorization (EUA). This EUA will remain  in effect (meaning this test can be used) for the duration of the  Covid-19 declaration under Section 564(b)(1) of the Act, 21  U.S.C. section 360bbb-3(b)(1), unless the authorization is  terminated or revoked. Performed at [facility], [address] [place], [place] [address]   MRSA PCR Screening     Status: None   Collection Time: 08/30/19 11:30 PM   Specimen: Nasopharyngeal  Result Value Ref Range Status   MRSA by PCR NEGATIVE NEGATIVE Final    Comment:        The GeneXpert MRSA Assay (FDA approved for NASAL specimens only), is one component of a comprehensive MRSA colonization surveillance program. It is not intended to diagnose MRSA infection nor to guide or monitor treatment for MRSA infections. Performed at [facility], [address] [place], [place] [address]      Labs: Basic Metabolic Panel: Recent Labs  Lab 08/30/19 1829 08/30/19 2212 08/31/19 0734 09/01/19 0658  NA 125*  --  135 133*  K 3.8  --  4.2 4.0  CL 92*  --  104  104  CO2 21*  --  21* 21*  GLUCOSE 104*  --  105* 89  BUN 22*  --  20 15  CREATININE 0.91  --  0.82 0.70  CALCIUM 7.4*  --  7.2* 7.4*  MG  --  2.0 2.0 1.9  PHOS  --  3.4 2.7 2.7   Liver Function  Tests: Recent Labs  Lab 08/30/19 1829 08/31/19 0734 09/01/19 0658  AST 37 34 21  ALT '16 17 9  '$ ALKPHOS 91 104 88  BILITOT 0.4 0.5 0.3  PROT 6.7 7.3 6.0*  ALBUMIN 1.9* 2.0* 1.6*   No results for input(s): LIPASE, AMYLASE in the last 168 hours. No results for input(s): AMMONIA in the last 168 hours. CBC: Recent Labs  Lab 08/30/19 1829 08/31/19 0734 09/01/19 0658  WBC 26.6*  --  22.2*  NEUTROABS 21.9*  --  18.1*  HGB 5.1* 7.1* 5.8*  HCT 15.2* 21.0* 17.4*  MCV 67.6*  --  70.2*  PLT 268  --  227   Cardiac Enzymes: No results for input(s): CKTOTAL, CKMB, CKMBINDEX, TROPONINI in the last 168 hours. BNP: BNP (last 3 results) No results for input(s): BNP in the last 8760 hours.  ProBNP (last 3 results) No results for input(s): PROBNP in the last 8760 hours.  CBG: Recent Labs  Lab 08/30/19 2305  GLUCAP 101*    Principal Problem:   Sepsis ([place]) Active Problems:   Opiate withdrawal ([facility])   Opiate abuse, continuous ([place])   Protein-calorie malnutrition, severe   Time coordinating discharge: 38 minutes.  Signed:        Greidy Sherard, DO [facility]  09/01/2019, 8:45 PM

## 2019-09-01 NOTE — Consult Note (Signed)
PHARMACY CONSULT NOTE - FOLLOW UP  Pharmacy Consult for Electrolyte Monitoring and Replacement   Recent Labs: Potassium (mmol/L)  Date Value  09/01/2019 4.0   Magnesium (mg/dL)  Date Value  02/33/4356 1.9   Calcium (mg/dL)  Date Value  86/16/8372 7.4 (L)   Albumin (g/dL)  Date Value  90/21/1155 1.6 (L)   Phosphorus (mg/dL)  Date Value  20/80/2233 2.7   Sodium (mmol/L)  Date Value  09/01/2019 133 (L)     Assessment: 44 year-old female admitted with MSSA bacteremia. Pharmacy has been consulted to manage electrolytes.  Goal of Therapy:  Given critical illness, will treat to potassium ~4 and magnesium ~2.    Plan:  Will give magnesium 1 g x 1 dose.  Electrolytes with AM labs.  Cherly Hensen ,PharmD 09/01/2019 4:42 PM

## 2019-09-01 NOTE — Progress Notes (Signed)
Patient is currently exiting the facility with [facility] in stable condition. All IV medications & fluids disconnected. Pain medication administered to patient prior to exiting the unit. All patient belongings transported with her.

## 2019-09-01 NOTE — Progress Notes (Signed)
While infusinf PRBCs, patient IV site in left forearm started to leak blood. Blood infusion stopped and new stat order placed for IV Team Consult. Patient noted to be a hard stick for IVs and to obtain blood.

## 2019-09-01 NOTE — Progress Notes (Addendum)
ID  pt drowsy Ill Patient Vitals for the past 24 hrs:  BP Temp Temp src Pulse Resp SpO2 Weight  09/01/19 1613 117/77 (!) 101.1 F (38.4 C) Oral (!) 115 (!) 25 96 % --  09/01/19 1500 -- -- -- (!) 117 (!) 23 99 % --  09/01/19 1400 119/75 -- -- (!) 121 (!) 26 95 % --  09/01/19 1300 120/76 -- -- (!) 118 (!) 36 100 % --  09/01/19 1200 127/71 98.9 F (37.2 C) Oral (!) 116 (!) 41 99 % --  09/01/19 1100 -- -- -- (!) 117 (!) 45 97 % --  09/01/19 1000 112/65 -- -- (!) 112 (!) 36 97 % --  09/01/19 0900 115/77 -- -- (!) 118 -- 99 % --  09/01/19 0800 119/74 98.5 F (36.9 C) Oral (!) 115 (!) 28 99 % --  09/01/19 0700 -- -- -- (!) 114 -- 94 % --  09/01/19 0500 -- -- -- -- -- -- 52.4 kg  09/01/19 0414 -- 97.9 F (36.6 C) Oral -- -- -- --  08/31/19 1925 -- (!) 102.4 F (39.1 C) Oral -- -- -- --  08/31/19 1800 128/73 -- -- (!) 124 (!) 37 96 % --  08/31/19 1700 135/71 -- -- (!) 128 (!) 42 96 % --    Chest b/l air entry- crepts Tachycardia Abd soft   CBC Latest Ref Rng & Units 09/01/2019 08/31/2019 08/30/2019  WBC 4.0 - 10.5 K/uL 22.2(H) - 26.6(H)  Hemoglobin 12.0 - 15.0 g/dL 7.0(J) 7.1(L) 5.1(L)  Hematocrit 36.0 - 46.0 % 17.4(L) 21.0(L) 15.2(L)  Platelets 150 - 400 K/uL 227 - 268    CMP Latest Ref Rng & Units 09/01/2019 08/31/2019 08/30/2019  Glucose 70 - 99 mg/dL 89 628(Z) 662(H)  BUN 6 - 20 mg/dL 15 20 47(M)  Creatinine 0.44 - 1.00 mg/dL 5.46 5.03 5.46  Sodium 135 - 145 mmol/L 133(L) 135 125(L)  Potassium 3.5 - 5.1 mmol/L 4.0 4.2 3.8  Chloride 98 - 111 mmol/L 104 104 92(L)  CO2 22 - 32 mmol/L 21(L) 21(L) 21(L)  Calcium 8.9 - 10.3 mg/dL 7.4(L) 7.2(L) 7.4(L)  Total Protein 6.5 - 8.1 g/dL 6.0(L) 7.3 6.7  Total Bilirubin 0.3 - 1.2 mg/dL 0.3 0.5 0.4  Alkaline Phos 38 - 126 U/L 88 104 91  AST 15 - 41 U/L 21 34 37  ALT 0 - 44 U/L 9 17 16     Micro 4/28 BC MSSA   Impression/recommendation  MSSA bacteremia with likely disseminated infection Septic emboli lungs  Tricuspid valve  endocarditis -prosthetic valve Vegetation on tip of leaflets of TV, 1.2x.5 cm. The tricuspid valve is  myxomatous. Tricuspid valve regurgitation is moderate.  L5-s1 discitis /osteomyelitis and rt sided facet septic arthritis as evidenced by MRI done at [facility] on 08/06/19 Very likely this is progressing and she may have paraspinal /psoas collection Will need MRI of the lumbar spine when stable  Because of ongoing fever change cefazolin to continuous Nafcillin infusion  Anemia- HB was 5.6 - check for hemolysis ( bili normal) will do LDH. 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

## 2019-09-02 LAB — TYPE AND SCREEN
ABO/RH(D): O POS
Antibody Screen: NEGATIVE
Unit division: 0
Unit division: 0
Unit division: 0

## 2019-09-02 LAB — CULTURE, BLOOD (ROUTINE X 2): Special Requests: ADEQUATE

## 2019-09-02 LAB — BPAM RBC
Blood Product Expiration Date: 202105182359
Blood Product Expiration Date: 202106032359
Blood Product Expiration Date: 202106052359
ISSUE DATE / TIME: 202104282251
ISSUE DATE / TIME: 202104301602
ISSUE DATE / TIME: 202104301940
Unit Type and Rh: 5100
Unit Type and Rh: 5100
Unit Type and Rh: 5100

## 2019-09-02 MED ORDER — GENERIC EXTERNAL MEDICATION
Status: DC
Start: ? — End: 2019-09-02

## 2019-09-02 MED ORDER — ACETAMINOPHEN 325 MG PO TABS
650.00 | ORAL_TABLET | ORAL | Status: DC
Start: ? — End: 2019-09-02

## 2019-09-02 MED ORDER — SENNOSIDES 8.6 MG PO TABS
1.00 | ORAL_TABLET | ORAL | Status: DC
Start: 2019-09-04 — End: 2019-09-02

## 2019-09-02 MED ORDER — GENERIC EXTERNAL MEDICATION
1.00 | Status: DC
Start: 2019-09-02 — End: 2019-09-02

## 2019-09-02 MED ORDER — HYDROXYZINE HCL 10 MG/5ML PO SYRP
25.00 | ORAL_SOLUTION | ORAL | Status: DC
Start: 2019-09-04 — End: 2019-09-02

## 2019-09-02 MED ORDER — BUSPIRONE HCL 7.5 MG PO TABS
7.50 | ORAL_TABLET | ORAL | Status: DC
Start: 2019-09-08 — End: 2019-09-02

## 2019-09-02 MED ORDER — DSS 100 MG PO CAPS
100.00 | ORAL_CAPSULE | ORAL | Status: DC
Start: 2019-09-04 — End: 2019-09-02

## 2019-09-02 MED ORDER — POLYETHYLENE GLYCOL 3350 17 GM/SCOOP PO POWD
17.00 | ORAL | Status: DC
Start: 2019-09-05 — End: 2019-09-02

## 2019-09-02 MED ORDER — HEPARIN SODIUM (PORCINE) 5000 UNIT/ML IJ SOLN
5000.00 | INTRAMUSCULAR | Status: DC
Start: 2019-09-08 — End: 2019-09-02

## 2019-09-02 MED ORDER — ONDANSETRON HCL 4 MG/2ML IJ SOLN
4.00 | INTRAMUSCULAR | Status: DC
Start: ? — End: 2019-09-02

## 2019-09-02 MED ORDER — GABAPENTIN 300 MG PO CAPS
300.00 | ORAL_CAPSULE | ORAL | Status: DC
Start: 2019-09-08 — End: 2019-09-02

## 2019-09-04 LAB — CULTURE, BLOOD (ROUTINE X 2): Culture: NO GROWTH

## 2019-09-04 MED ORDER — GENERIC EXTERNAL MEDICATION
2.00 | Status: DC
Start: 2019-09-17 — End: 2019-09-04

## 2019-09-04 MED ORDER — LIDOCAINE 5 % EX PTCH
1.00 | MEDICATED_PATCH | CUTANEOUS | Status: DC
Start: 2019-09-09 — End: 2019-09-04

## 2019-09-04 MED ORDER — ACETAMINOPHEN 325 MG PO TABS
650.00 | ORAL_TABLET | ORAL | Status: DC
Start: 2019-09-08 — End: 2019-09-04

## 2019-09-04 MED ORDER — HYDROMORPHONE HCL 1 MG/ML IJ SOLN
0.50 | INTRAMUSCULAR | Status: DC
Start: ? — End: 2019-09-04

## 2019-09-04 MED ORDER — DICLOFENAC SODIUM 1 % EX GEL
2.00 | CUTANEOUS | Status: DC
Start: 2019-09-08 — End: 2019-09-04

## 2019-09-04 MED ORDER — NYSTATIN 100000 UNIT/ML MT SUSP
500000.00 | OROMUCOSAL | Status: DC
Start: 2019-09-08 — End: 2019-09-04

## 2019-09-04 MED ORDER — IOHEXOL 350 MG/ML SOLN
100.00 | INTRAVENOUS | Status: DC
Start: ? — End: 2019-09-04

## 2019-09-04 MED ORDER — ALPRAZOLAM 0.5 MG PO TABS
0.50 | ORAL_TABLET | ORAL | Status: DC
Start: ? — End: 2019-09-04

## 2019-09-08 MED ORDER — SODIUM BICARBONATE 8.4 % IV SOLN
INTRAVENOUS | Status: DC
Start: ? — End: 2019-09-08

## 2019-09-08 MED ORDER — GENERIC EXTERNAL MEDICATION
Status: DC
Start: ? — End: 2019-09-08

## 2019-09-08 MED ORDER — MELATONIN 3 MG PO TABS
3.00 | ORAL_TABLET | ORAL | Status: DC
Start: 2019-09-08 — End: 2019-09-08

## 2019-09-08 MED ORDER — SIMETHICONE 80 MG PO CHEW
80.00 | CHEWABLE_TABLET | ORAL | Status: DC
Start: ? — End: 2019-09-08

## 2019-09-08 MED ORDER — LACTATED RINGERS IV SOLN
INTRAVENOUS | Status: DC
Start: ? — End: 2019-09-08

## 2019-09-08 MED ORDER — OXYCODONE HCL 5 MG PO TABS
5.00 | ORAL_TABLET | ORAL | Status: DC
Start: ? — End: 2019-09-08

## 2019-09-08 MED ORDER — HYDROMORPHONE HCL 1 MG/ML IJ SOLN
0.50 | INTRAMUSCULAR | Status: DC
Start: ? — End: 2019-09-08

## 2019-09-08 MED ORDER — ALBUMIN HUMAN 25 % IV SOLN
INTRAVENOUS | Status: DC
Start: ? — End: 2019-09-08

## 2019-09-08 MED ORDER — RIFAMPIN 300 MG PO CAPS
300.00 | ORAL_CAPSULE | ORAL | Status: DC
Start: 2019-09-08 — End: 2019-09-08

## 2019-09-08 MED ORDER — MANNITOL 25 % IV SOLN
INTRAVENOUS | Status: DC
Start: ? — End: 2019-09-08

## 2019-09-08 MED ORDER — DIPHENHYDRAMINE HCL 25 MG PO CAPS
25.00 | ORAL_CAPSULE | ORAL | Status: DC
Start: ? — End: 2019-09-08

## 2019-09-08 MED ORDER — CHLORHEXIDINE GLUCONATE 0.12 % MT SOLN
15.00 | OROMUCOSAL | Status: DC
Start: 2019-09-08 — End: 2019-09-08

## 2019-09-08 MED ORDER — MUPIROCIN 2 % EX OINT
1.00 | TOPICAL_OINTMENT | CUTANEOUS | Status: DC
Start: 2019-09-08 — End: 2019-09-08

## 2019-09-08 MED ORDER — ALBUTEROL SULFATE (2.5 MG/3ML) 0.083% IN NEBU
2.50 | INHALATION_SOLUTION | RESPIRATORY_TRACT | Status: DC
Start: ? — End: 2019-09-08

## 2019-09-08 MED ORDER — LOPERAMIDE HCL 2 MG PO CAPS
2.00 | ORAL_CAPSULE | ORAL | Status: DC
Start: ? — End: 2019-09-08

## 2019-09-08 MED ORDER — PROMETHAZINE HCL 25 MG PO TABS
12.50 | ORAL_TABLET | ORAL | Status: DC
Start: ? — End: 2019-09-08

## 2019-09-08 MED ORDER — ACETAMINOPHEN 325 MG PO TABS
650.00 | ORAL_TABLET | ORAL | Status: DC
Start: ? — End: 2019-09-08

## 2019-09-08 MED ORDER — CEFUROXIME SODIUM 1.5 G IJ SOLR
INTRAMUSCULAR | Status: DC
Start: ? — End: 2019-09-08

## 2019-09-08 MED ORDER — HEPARIN SODIUM (PORCINE) 5000 UNIT/ML IJ SOLN
INTRAMUSCULAR | Status: DC
Start: ? — End: 2019-09-08

## 2019-09-08 MED ORDER — AMINOCAPROIC ACID 250 MG/ML IV SOLN
INTRAVENOUS | Status: DC
Start: ? — End: 2019-09-08

## 2019-09-08 MED ORDER — HEPARIN SODIUM (PORCINE) 10000 UNIT/ML IJ SOLN
INTRAMUSCULAR | Status: DC
Start: ? — End: 2019-09-08

## 2019-09-08 MED ORDER — NUTRISOURCE FIBER PO PACK
1.00 | PACK | ORAL | Status: DC
Start: 2019-09-08 — End: 2019-09-08

## 2019-09-08 MED ORDER — SODIUM CHLORIDE 0.9 % IR SOLN
Status: DC
Start: ? — End: 2019-09-08

## 2019-09-08 MED ORDER — FUROSEMIDE 40 MG PO TABS
40.00 | ORAL_TABLET | ORAL | Status: DC
Start: 2019-09-09 — End: 2019-09-08

## 2019-09-08 MED ORDER — OXYCODONE HCL 5 MG PO TABS
10.00 | ORAL_TABLET | ORAL | Status: DC
Start: ? — End: 2019-09-08

## 2019-09-08 MED ORDER — NICOTINE 21 MG/24HR TD PT24
1.00 | MEDICATED_PATCH | TRANSDERMAL | Status: DC
Start: 2019-09-09 — End: 2019-09-08

## 2019-09-08 MED ORDER — VANCOMYCIN HCL 1000 MG IV SOLR
INTRAVENOUS | Status: DC
Start: ? — End: 2019-09-08

## 2019-09-10 MED ORDER — HYDROMORPHONE HCL 2 MG PO TABS
2.00 | ORAL_TABLET | ORAL | Status: DC
Start: 2019-09-10 — End: 2019-09-10

## 2019-09-10 MED ORDER — NALOXONE HCL 0.4 MG/ML IJ SOLN
0.40 | INTRAMUSCULAR | Status: DC
Start: ? — End: 2019-09-10

## 2019-09-10 MED ORDER — POLYETHYLENE GLYCOL 3350 17 GM/SCOOP PO POWD
17.00 | ORAL | Status: DC
Start: 2019-09-11 — End: 2019-09-10

## 2019-09-10 MED ORDER — LIDOCAINE 5 % EX PTCH
2.00 | MEDICATED_PATCH | CUTANEOUS | Status: DC
Start: 2019-09-18 — End: 2019-09-10

## 2019-09-10 MED ORDER — GABAPENTIN 300 MG PO CAPS
600.00 | ORAL_CAPSULE | ORAL | Status: DC
Start: 2019-09-17 — End: 2019-09-10

## 2019-09-10 MED ORDER — GENERIC EXTERNAL MEDICATION
60.00 | Status: DC
Start: 2019-09-14 — End: 2019-09-10

## 2019-09-10 MED ORDER — NICOTINE 21 MG/24HR TD PT24
1.00 | MEDICATED_PATCH | TRANSDERMAL | Status: DC
Start: 2019-09-18 — End: 2019-09-10

## 2019-09-10 MED ORDER — SODIUM CHLORIDE 0.9 % IV SOLN
20.00 | INTRAVENOUS | Status: DC
Start: ? — End: 2019-09-10

## 2019-09-10 MED ORDER — ONDANSETRON HCL 4 MG/2ML IJ SOLN
4.00 | INTRAMUSCULAR | Status: DC
Start: ? — End: 2019-09-10

## 2019-09-10 MED ORDER — ASPIRIN 81 MG PO CHEW
81.00 | CHEWABLE_TABLET | ORAL | Status: DC
Start: 2019-09-18 — End: 2019-09-10

## 2019-09-10 MED ORDER — SENNOSIDES 8.6 MG PO TABS
1.00 | ORAL_TABLET | ORAL | Status: DC
Start: 2019-09-11 — End: 2019-09-10

## 2019-09-10 MED ORDER — HEPARIN SODIUM (PORCINE) 5000 UNIT/ML IJ SOLN
5000.00 | INTRAMUSCULAR | Status: DC
Start: 2019-09-17 — End: 2019-09-10

## 2019-09-10 MED ORDER — ACETAMINOPHEN 500 MG PO TABS
1000.00 | ORAL_TABLET | ORAL | Status: DC
Start: 2019-09-17 — End: 2019-09-10

## 2019-09-10 MED ORDER — HYDROMORPHONE HCL 1 MG/ML IJ SOLN
1.00 | INTRAMUSCULAR | Status: DC
Start: ? — End: 2019-09-10

## 2019-09-10 MED ORDER — GENERIC EXTERNAL MEDICATION
12.50 | Status: DC
Start: ? — End: 2019-09-10

## 2019-09-10 MED ORDER — RIFAMPIN 300 MG PO CAPS
300.00 | ORAL_CAPSULE | ORAL | Status: DC
Start: 2019-09-17 — End: 2019-09-10

## 2019-09-10 MED ORDER — OLANZAPINE 2.5 MG PO TABS
5.00 | ORAL_TABLET | ORAL | Status: DC
Start: ? — End: 2019-09-10

## 2019-09-10 MED ORDER — HYDROMORPHONE HCL 2 MG PO TABS
2.00 | ORAL_TABLET | ORAL | Status: DC
Start: ? — End: 2019-09-10

## 2019-09-10 MED ORDER — GENERIC EXTERNAL MEDICATION
Status: DC
Start: ? — End: 2019-09-10

## 2019-09-10 MED ORDER — DSS 100 MG PO CAPS
100.00 | ORAL_CAPSULE | ORAL | Status: DC
Start: 2019-09-11 — End: 2019-09-10

## 2019-09-13 MED ORDER — GENERIC EXTERNAL MEDICATION
Status: DC
Start: ? — End: 2019-09-13

## 2019-09-13 MED ORDER — LOPERAMIDE HCL 2 MG PO CAPS
2.00 | ORAL_CAPSULE | ORAL | Status: DC
Start: ? — End: 2019-09-13

## 2019-09-13 MED ORDER — METOPROLOL TARTRATE 25 MG PO TABS
12.50 | ORAL_TABLET | ORAL | Status: DC
Start: 2019-09-13 — End: 2019-09-13

## 2019-09-13 MED ORDER — MELATONIN 3 MG PO TABS
3.00 | ORAL_TABLET | ORAL | Status: DC
Start: 2019-09-17 — End: 2019-09-13

## 2019-09-13 MED ORDER — SIMETHICONE 80 MG PO CHEW
80.00 | CHEWABLE_TABLET | ORAL | Status: DC
Start: ? — End: 2019-09-13

## 2019-09-13 MED ORDER — HYDROMORPHONE HCL 2 MG PO TABS
1.00 | ORAL_TABLET | ORAL | Status: DC
Start: 2019-09-13 — End: 2019-09-13

## 2019-09-13 MED ORDER — POTASSIUM CHLORIDE 20 MEQ/100ML IV SOLN
20.00 | INTRAVENOUS | Status: DC
Start: 2019-09-13 — End: 2019-09-13

## 2019-09-18 ENCOUNTER — Encounter: Payer: Self-pay | Admitting: Emergency Medicine

## 2019-09-18 ENCOUNTER — Emergency Department: Payer: Medicaid Other

## 2019-09-18 ENCOUNTER — Other Ambulatory Visit: Payer: Self-pay

## 2019-09-18 ENCOUNTER — Inpatient Hospital Stay
Admission: EM | Admit: 2019-09-18 | Discharge: 2019-09-26 | DRG: 288 | Discharge status: Against Medical Advice | Payer: Medicaid Other | Attending: Internal Medicine | Admitting: Internal Medicine

## 2019-09-18 DIAGNOSIS — Z800 Family history of malignant neoplasm of digestive organs: Secondary | ICD-10-CM

## 2019-09-18 DIAGNOSIS — F1990 Other psychoactive substance use, unspecified, uncomplicated: Secondary | ICD-10-CM

## 2019-09-18 DIAGNOSIS — Z981 Arthrodesis status: Secondary | ICD-10-CM

## 2019-09-18 DIAGNOSIS — F319 Bipolar disorder, unspecified: Secondary | ICD-10-CM | POA: Diagnosis present

## 2019-09-18 DIAGNOSIS — D638 Anemia in other chronic diseases classified elsewhere: Secondary | ICD-10-CM | POA: Diagnosis present

## 2019-09-18 DIAGNOSIS — Z79899 Other long term (current) drug therapy: Secondary | ICD-10-CM

## 2019-09-18 DIAGNOSIS — E872 Acidosis, unspecified: Secondary | ICD-10-CM | POA: Diagnosis present

## 2019-09-18 DIAGNOSIS — I2690 Septic pulmonary embolism without acute cor pulmonale: Secondary | ICD-10-CM | POA: Diagnosis present

## 2019-09-18 DIAGNOSIS — F1110 Opioid abuse, uncomplicated: Secondary | ICD-10-CM | POA: Diagnosis present

## 2019-09-18 DIAGNOSIS — Z560 Unemployment, unspecified: Secondary | ICD-10-CM

## 2019-09-18 DIAGNOSIS — I330 Acute and subacute infective endocarditis: Principal | ICD-10-CM | POA: Diagnosis present

## 2019-09-18 DIAGNOSIS — R7881 Bacteremia: Secondary | ICD-10-CM

## 2019-09-18 DIAGNOSIS — F17210 Nicotine dependence, cigarettes, uncomplicated: Secondary | ICD-10-CM | POA: Diagnosis present

## 2019-09-18 DIAGNOSIS — I079 Rheumatic tricuspid valve disease, unspecified: Secondary | ICD-10-CM | POA: Diagnosis present

## 2019-09-18 DIAGNOSIS — Z5320 Procedure and treatment not carried out because of patient's decision for unspecified reasons: Secondary | ICD-10-CM | POA: Diagnosis not present

## 2019-09-18 DIAGNOSIS — Z833 Family history of diabetes mellitus: Secondary | ICD-10-CM

## 2019-09-18 DIAGNOSIS — E871 Hypo-osmolality and hyponatremia: Secondary | ICD-10-CM | POA: Diagnosis present

## 2019-09-18 DIAGNOSIS — Z7901 Long term (current) use of anticoagulants: Secondary | ICD-10-CM

## 2019-09-18 DIAGNOSIS — Z8619 Personal history of other infectious and parasitic diseases: Secondary | ICD-10-CM

## 2019-09-18 DIAGNOSIS — I4510 Unspecified right bundle-branch block: Secondary | ICD-10-CM | POA: Diagnosis present

## 2019-09-18 DIAGNOSIS — B9561 Methicillin susceptible Staphylococcus aureus infection as the cause of diseases classified elsewhere: Secondary | ICD-10-CM | POA: Diagnosis present

## 2019-09-18 DIAGNOSIS — N179 Acute kidney failure, unspecified: Secondary | ICD-10-CM | POA: Diagnosis present

## 2019-09-18 DIAGNOSIS — Z716 Tobacco abuse counseling: Secondary | ICD-10-CM

## 2019-09-18 DIAGNOSIS — Z5329 Procedure and treatment not carried out because of patient's decision for other reasons: Secondary | ICD-10-CM | POA: Diagnosis present

## 2019-09-18 DIAGNOSIS — Z952 Presence of prosthetic heart valve: Secondary | ICD-10-CM

## 2019-09-18 DIAGNOSIS — E876 Hypokalemia: Secondary | ICD-10-CM | POA: Diagnosis present

## 2019-09-18 DIAGNOSIS — M4647 Discitis, unspecified, lumbosacral region: Secondary | ICD-10-CM | POA: Diagnosis present

## 2019-09-18 DIAGNOSIS — B182 Chronic viral hepatitis C: Secondary | ICD-10-CM | POA: Diagnosis present

## 2019-09-18 DIAGNOSIS — Z8774 Personal history of (corrected) congenital malformations of heart and circulatory system: Secondary | ICD-10-CM

## 2019-09-18 DIAGNOSIS — Z9119 Patient's noncompliance with other medical treatment and regimen: Secondary | ICD-10-CM

## 2019-09-18 LAB — PREGNANCY, URINE: Preg Test, Ur: NEGATIVE

## 2019-09-18 LAB — CBC WITH DIFFERENTIAL/PLATELET
Abs Immature Granulocytes: 0.29 10*3/uL — ABNORMAL HIGH (ref 0.00–0.07)
Basophils Absolute: 0.2 10*3/uL — ABNORMAL HIGH (ref 0.0–0.1)
Basophils Relative: 1 %
Eosinophils Absolute: 0.2 10*3/uL (ref 0.0–0.5)
Eosinophils Relative: 1 %
HCT: 20.4 % — ABNORMAL LOW (ref 36.0–46.0)
Hemoglobin: 6.5 g/dL — ABNORMAL LOW (ref 12.0–15.0)
Immature Granulocytes: 2 %
Lymphocytes Relative: 22 %
Lymphs Abs: 2.9 10*3/uL (ref 0.7–4.0)
MCH: 27.3 pg (ref 26.0–34.0)
MCHC: 31.9 g/dL (ref 30.0–36.0)
MCV: 85.7 fL (ref 80.0–100.0)
Monocytes Absolute: 1 10*3/uL (ref 0.1–1.0)
Monocytes Relative: 8 %
Neutro Abs: 8.5 10*3/uL — ABNORMAL HIGH (ref 1.7–7.7)
Neutrophils Relative %: 66 %
Platelets: 713 10*3/uL — ABNORMAL HIGH (ref 150–400)
RBC: 2.38 MIL/uL — ABNORMAL LOW (ref 3.87–5.11)
RDW: 22.8 % — ABNORMAL HIGH (ref 11.5–15.5)
Smear Review: NORMAL
WBC: 13.1 10*3/uL — ABNORMAL HIGH (ref 4.0–10.5)
nRBC: 0 % (ref 0.0–0.2)

## 2019-09-18 LAB — PROTEIN / CREATININE RATIO, URINE
Creatinine, Urine: 21 mg/dL
Protein Creatinine Ratio: 4.67 mg/mg{Cre} — ABNORMAL HIGH (ref 0.00–0.15)
Total Protein, Urine: 98 mg/dL

## 2019-09-18 LAB — COMPREHENSIVE METABOLIC PANEL
ALT: 8 U/L (ref 0–44)
AST: 10 U/L — ABNORMAL LOW (ref 15–41)
Albumin: 1.9 g/dL — ABNORMAL LOW (ref 3.5–5.0)
Alkaline Phosphatase: 93 U/L (ref 38–126)
Anion gap: 12 (ref 5–15)
BUN: 19 mg/dL (ref 6–20)
CO2: 13 mmol/L — ABNORMAL LOW (ref 22–32)
Calcium: 8.2 mg/dL — ABNORMAL LOW (ref 8.9–10.3)
Chloride: 111 mmol/L (ref 98–111)
Creatinine, Ser: 2 mg/dL — ABNORMAL HIGH (ref 0.44–1.00)
GFR calc Af Amer: 34 mL/min — ABNORMAL LOW (ref >60)
GFR calc non Af Amer: 29 mL/min — ABNORMAL LOW (ref >60)
Glucose, Bld: 84 mg/dL (ref 70–99)
Potassium: 3 mmol/L — ABNORMAL LOW (ref 3.5–5.1)
Sodium: 136 mmol/L (ref 135–145)
Total Bilirubin: 0.9 mg/dL (ref 0.3–1.2)
Total Protein: 7.5 g/dL (ref 6.5–8.1)

## 2019-09-18 LAB — BRAIN NATRIURETIC PEPTIDE: B Natriuretic Peptide: 329 pg/mL — ABNORMAL HIGH (ref 0.0–100.0)

## 2019-09-18 LAB — URINALYSIS, COMPLETE (UACMP) WITH MICROSCOPIC
Bilirubin Urine: NEGATIVE
Glucose, UA: NEGATIVE mg/dL
Ketones, ur: NEGATIVE mg/dL
Nitrite: NEGATIVE
Protein, ur: 30 mg/dL — AB
Specific Gravity, Urine: 1.008 (ref 1.005–1.030)
Squamous Epithelial / HPF: NONE SEEN (ref 0–5)
pH: 5 (ref 5.0–8.0)

## 2019-09-18 LAB — URINE DRUG SCREEN, QUALITATIVE (ARMC ONLY)
Amphetamines, Ur Screen: NOT DETECTED
Barbiturates, Ur Screen: NOT DETECTED
Benzodiazepine, Ur Scrn: POSITIVE — AB
Cannabinoid 50 Ng, Ur ~~LOC~~: NOT DETECTED
Cocaine Metabolite,Ur ~~LOC~~: NOT DETECTED
MDMA (Ecstasy)Ur Screen: NOT DETECTED
Methadone Scn, Ur: NOT DETECTED
Opiate, Ur Screen: POSITIVE — AB
Phencyclidine (PCP) Ur S: NOT DETECTED
Tricyclic, Ur Screen: NOT DETECTED

## 2019-09-18 LAB — PREPARE RBC (CROSSMATCH)

## 2019-09-18 LAB — PROCALCITONIN: Procalcitonin: 0.13 ng/mL

## 2019-09-18 LAB — APTT: aPTT: 65 seconds — ABNORMAL HIGH (ref 24–36)

## 2019-09-18 LAB — PROTIME-INR
INR: 2 — ABNORMAL HIGH (ref 0.8–1.2)
Prothrombin Time: 22.2 seconds — ABNORMAL HIGH (ref 11.4–15.2)

## 2019-09-18 MED ORDER — POTASSIUM CHLORIDE CRYS ER 20 MEQ PO TBCR
40.0000 meq | EXTENDED_RELEASE_TABLET | Freq: Once | ORAL | Status: AC
  Administered 2019-09-18: 40 meq via ORAL
  Filled 2019-09-18: qty 2

## 2019-09-18 MED ORDER — ONDANSETRON HCL 4 MG PO TABS
4.0000 mg | ORAL_TABLET | Freq: Four times a day (QID) | ORAL | Status: DC | PRN
  Administered 2019-09-18 – 2019-09-20 (×3): 4 mg via ORAL
  Filled 2019-09-18 (×5): qty 1

## 2019-09-18 MED ORDER — ALPRAZOLAM 0.5 MG PO TABS
0.50 | ORAL_TABLET | ORAL | Status: DC
Start: ? — End: 2019-09-18

## 2019-09-18 MED ORDER — NICOTINE 21 MG/24HR TD PT24
21.0000 mg | MEDICATED_PATCH | Freq: Every day | TRANSDERMAL | Status: DC
  Administered 2019-09-19 – 2019-09-26 (×8): 21 mg via TRANSDERMAL
  Filled 2019-09-18 (×8): qty 1

## 2019-09-18 MED ORDER — OXYCODONE HCL 5 MG/5ML PO SOLN
10.00 | ORAL | Status: DC
Start: ? — End: 2019-09-18

## 2019-09-18 MED ORDER — METOPROLOL TARTRATE 25 MG PO TABS
12.50 | ORAL_TABLET | ORAL | Status: DC
Start: 2019-09-17 — End: 2019-09-18

## 2019-09-18 MED ORDER — SODIUM CHLORIDE 0.9 % IV SOLN
12.0000 g | INTRAVENOUS | Status: DC
  Administered 2019-09-19 – 2019-09-26 (×7): 12 g via INTRAVENOUS
  Filled 2019-09-18 (×9): qty 12000

## 2019-09-18 MED ORDER — ONDANSETRON HCL 4 MG/2ML IJ SOLN
4.0000 mg | Freq: Four times a day (QID) | INTRAMUSCULAR | Status: DC | PRN
  Administered 2019-09-19 – 2019-09-26 (×22): 4 mg via INTRAVENOUS
  Filled 2019-09-18 (×22): qty 2

## 2019-09-18 MED ORDER — SIMETHICONE 80 MG PO CHEW
80.0000 mg | CHEWABLE_TABLET | Freq: Four times a day (QID) | ORAL | Status: DC | PRN
  Administered 2019-09-19 – 2019-09-22 (×4): 80 mg via ORAL
  Filled 2019-09-18 (×6): qty 1

## 2019-09-18 MED ORDER — GENTAMICIN SULFATE 40 MG/ML IJ SOLN
60.0000 mg | INTRAVENOUS | Status: DC
  Administered 2019-09-18: 60 mg via INTRAVENOUS
  Filled 2019-09-18: qty 1.5

## 2019-09-18 MED ORDER — SODIUM CHLORIDE FLUSH 0.9 % IV SOLN
10.00 | INTRAVENOUS | Status: DC
Start: 2019-09-17 — End: 2019-09-18

## 2019-09-18 MED ORDER — SODIUM CHLORIDE 0.9% IV SOLUTION
Freq: Once | INTRAVENOUS | Status: DC
  Filled 2019-09-18: qty 250

## 2019-09-18 MED ORDER — NAFCILLIN SODIUM 2 G IJ SOLR
2.0000 g | Freq: Three times a day (TID) | INTRAMUSCULAR | Status: DC
  Filled 2019-09-18: qty 2000

## 2019-09-18 MED ORDER — OXYCODONE HCL 5 MG/5ML PO SOLN
10.00 | ORAL | Status: DC
Start: 2019-09-17 — End: 2019-09-18

## 2019-09-18 MED ORDER — ACETAMINOPHEN 325 MG PO TABS
650.0000 mg | ORAL_TABLET | Freq: Four times a day (QID) | ORAL | Status: DC | PRN
  Administered 2019-09-19 – 2019-09-21 (×3): 650 mg via ORAL
  Filled 2019-09-18 (×3): qty 2

## 2019-09-18 MED ORDER — ALPRAZOLAM 0.5 MG PO TABS
1.0000 mg | ORAL_TABLET | Freq: Three times a day (TID) | ORAL | Status: DC
  Administered 2019-09-18 – 2019-09-26 (×26): 1 mg via ORAL
  Filled 2019-09-18 (×26): qty 2

## 2019-09-18 MED ORDER — METHADONE HCL 10 MG PO TABS: 10.0000 mg | ORAL_TABLET | Freq: Every day | ORAL | Status: DC

## 2019-09-18 MED ORDER — GENTAMICIN IN SALINE 1.2-0.9 MG/ML-% IV SOLN
60.0000 mg | INTRAVENOUS | Status: DC
  Filled 2019-09-18: qty 50

## 2019-09-18 MED ORDER — ENSURE ENLIVE PO LIQD
237.0000 mL | Freq: Three times a day (TID) | ORAL | Status: DC
  Administered 2019-09-19 – 2019-09-26 (×20): 237 mL via ORAL

## 2019-09-18 MED ORDER — ACETAMINOPHEN 10 MG/ML IV SOLN
1000.0000 mg | Freq: Four times a day (QID) | INTRAVENOUS | Status: AC
  Administered 2019-09-18 – 2019-09-19 (×4): 1000 mg via INTRAVENOUS
  Filled 2019-09-18 (×5): qty 100

## 2019-09-18 MED ORDER — GENERIC EXTERNAL MEDICATION
10.00 | Status: DC
Start: 2019-09-17 — End: 2019-09-18

## 2019-09-18 MED ORDER — POLYETHYLENE GLYCOL 3350 17 G PO PACK: 17.0000 g | PACK | Freq: Every day | ORAL | Status: DC | PRN

## 2019-09-18 MED ORDER — SODIUM CHLORIDE 0.9 % IV SOLN
2.0000 g | INTRAVENOUS | Status: DC
  Administered 2019-09-18: 2 g via INTRAVENOUS
  Filled 2019-09-18 (×4): qty 2000

## 2019-09-18 MED ORDER — SODIUM CHLORIDE 0.9 % IV SOLN: INTRAVENOUS | Status: DC

## 2019-09-18 MED ORDER — OXYCODONE HCL 5 MG/5ML PO SOLN
5.00 | ORAL | Status: DC
Start: ? — End: 2019-09-18

## 2019-09-18 MED ORDER — DOCUSATE SODIUM 100 MG PO CAPS: 100.0000 mg | ORAL_CAPSULE | Freq: Two times a day (BID) | ORAL | Status: DC | PRN

## 2019-09-18 MED ORDER — RIFAMPIN 300 MG PO CAPS
300.0000 mg | ORAL_CAPSULE | Freq: Two times a day (BID) | ORAL | Status: DC
  Administered 2019-09-18 – 2019-09-20 (×5): 300 mg via ORAL
  Filled 2019-09-18 (×6): qty 1

## 2019-09-18 MED ORDER — OLANZAPINE 5 MG PO TBDP
5.00 | ORAL_TABLET | ORAL | Status: DC
Start: ? — End: 2019-09-18

## 2019-09-18 MED ORDER — CHLORHEXIDINE GLUCONATE CLOTH 2 % EX PADS
6.0000 | MEDICATED_PAD | Freq: Every day | CUTANEOUS | Status: DC
  Administered 2019-09-19 – 2019-09-26 (×6): 6 via TOPICAL
  Filled 2019-09-18: qty 6

## 2019-09-18 MED ORDER — HYDROMORPHONE HCL 1 MG/ML IJ SOLN
1.0000 mg | INTRAMUSCULAR | Status: DC | PRN
  Administered 2019-09-18 – 2019-09-23 (×27): 1 mg via INTRAVENOUS
  Filled 2019-09-18 (×27): qty 1

## 2019-09-18 MED ORDER — OXYCODONE HCL 5 MG PO TABS: 10.0000 mg | ORAL_TABLET | ORAL | Status: DC | PRN

## 2019-09-18 MED ORDER — IPRATROPIUM-ALBUTEROL 0.5-2.5 (3) MG/3ML IN SOLN: 3.0000 mL | Freq: Four times a day (QID) | RESPIRATORY_TRACT | Status: DC | PRN

## 2019-09-18 NOTE — ED Notes (Signed)
Pt woke up and voided on the floor.  Pt states she was confused.  prbc's still infusing  No reaction noted.  Pt more alert and now asking for pain meds again.  None given at this time.

## 2019-09-18 NOTE — H&P (Addendum)
History and Physical    Andrea King:[id number] DOB: 1975-11-11 DOA: 09/18/2019  PCP: [name], CNM   Patient coming from: Home  I have personally briefly reviewed patient's old medical records in [place]  Chief Complaint: Altered mental status  HPI: Andrea King is a 44 y.o. 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 She [facility] 4/6, [facility] fever, malaise, and TTE confirming 1.2x0.5cm TV veg with moderate TR [facility] valvular repair for tricuspid vegetations with endocarditis secondary to MSSA bacteremia. Patient is status post redo Sternotomy,Tricupsid valve replacement, Repair PFO on 09/08/19. Post op TTE completed 5/10 with EF 50-55%, mild transvalvular regurgitation of prosthetic valve. Patient was seen by infectious disease  5/7 OR cultures of heart valve - NGTD 5/11 repeat blood cultures x2 drawn on 05/11 Recommend to continue nafcillin (anticipate 6 week course of therapy), end date TBD  Continue gentamicin (plan is for 2 weeks tentatively with end date of 5/17) Continue rifampin po tid  Repeat MRI C spine to eval for osteo, abscesses per ID however patient has wires that will be cut -  Negative c diff on 05/04  [facility] and was given a prescription for Augmentin 875 mg p.o. twice daily (for 28 days, end date 10/15/19).  Patient did not fill the  prescription and was brought into the emergency room by her friend because he states that she was not acting right.  Patient was noted to be lethargic in triage and not responding to verbal stimuli.  She also had pinpoint pupils and her friend stated that she had used heroin.  The ER nurse was going to give Narcan but patient declined.  She is currently awake and alert. Labs reveal a serum creatinine of 2.0 above her baseline of 1.27.  Potassium of 3 and hemoglobin of 6.5g/dl.  ED Course: 44year old female status [facility] room for evaluation of altered mental status.  Patient will be admitted to complete her IV antibiotic therapy.  She received a dose of gentamicin in the ER.  Patient to be admitted for further evaluation.  Review of Systems: As per HPI otherwise 10 point review of systems negative.  Cough   Past Medical History:  Diagnosis Date  . Anemia   . Asthma   . DJD (degenerative joint disease)   . Hepatitis C   . Substance abuse ([facility]     Past Surgical History:  Procedure Laterality Date  . INGUINAL HERNIA REPAIR Left 04/28/2013   Procedure: HERNIA REPAIR INGUINAL ADULT with mesh;  Surgeon: JGwenyth Ober MD;  [facility]  Service: General;  Laterality: Left;  . TEE WITHOUT CARDIOVERSION N/A 11/18/2015   Procedure: TRANSESOPHAGEAL ECHOCARDIOGRAM (TEE);  Surgeon: MWellington Hampshire MD;  Location: [facility] ORS;  Service: Cardiovascular;  Laterality: N/A;  . TEE  WITHOUT CARDIOVERSION N/A 12/31/2017   Procedure: TRANSESOPHAGEAL ECHOCARDIOGRAM (TEE);  Surgeon: Teodoro Spray, MD;  Location: [facility] ORS;  Service: Cardiovascular;  Laterality: N/A;  . TEE WITHOUT CARDIOVERSION N/A 01/04/2018   Procedure: TRANSESOPHAGEAL ECHOCARDIOGRAM (TEE);  Surgeon: Teodoro Spray, MD;  Location: [facility] ORS;  Service: Cardiovascular;  Laterality:  N/A;  . tricuspid valve replacement    . TUMOR EXCISION     left scalpula     reports that she has been smoking cigarettes. She has never used smokeless tobacco. She reports current drug use. She reports that she does not drink alcohol.  No Known Allergies  Family History  Problem Relation Age of Onset  . Cancer Mother   . Cancer - Colon Maternal Grandfather   . Diabetes Maternal Grandfather      Prior to Admission medications   Medication Sig Start Date End Date Taking? Authorizing Provider  acetaminophen (TYLENOL) 325 MG tablet Take 2 tablets (650 mg total) by mouth every 6 (six) hours as needed for mild pain or fever. 09/01/19   Bradly Bienenstock, NP  ALPRAZolam Duanne Moron) 1 MG tablet Take 1 tablet (1 mg total) by mouth 3 (three) times daily. 09/01/19   Bradly Bienenstock, NP  Chlorhexidine Gluconate Cloth 2 % PADS Apply 6 each topically daily. 09/02/19   Bradly Bienenstock, NP  docusate sodium (COLACE) 100 MG capsule Take 1 capsule (100 mg total) by mouth 2 (two) times daily as needed for mild constipation. 09/01/19   Bradly Bienenstock, NP  enoxaparin (LOVENOX) 40 MG/0.4ML injection Inject 0.4 mLs (40 mg total) into the skin daily. 09/02/19   Bradly Bienenstock, NP  feeding supplement, ENSURE ENLIVE, (ENSURE ENLIVE) LIQD Take 237 mLs by mouth 3 (three) times daily between meals. 09/02/19   Bradly Bienenstock, NP  fluconazole (DIFLUCAN) 100 MG tablet Take 1 tablet (100 mg total) by mouth daily. 09/02/19   Darel Hong D, NP  ipratropium-albuterol (DUONEB) 0.5-2.5 (3) MG/3ML SOLN Take 3 mLs by nebulization every 6 (six) hours as needed. 09/01/19   Darel Hong D, NP  methadone (DOLOPHINE) 10 MG tablet Take 1 tablet (10 mg total) by mouth daily. 09/02/19   Darel Hong D, NP  morphine 2 MG/ML injection Inject 1-2 mLs (2-4 mg total) into the vein every 3 (three) hours as needed. 09/01/19   Darel Hong D, NP  nafcillin IVPB Inject 12 g into the vein daily. Indication:  MSSA Bacteremia,  Endocarditis First Dose: 09/01/2019 Last Day of Therapy:  To be determined by Infectious Disease Labs - Once weekly:  CBC/D and BMP, Labs - Every other week:  ESR and CRP Method of administration: Elastomeric (Continuous infusion) Method of administration may be changed at the discretion of home infusion pharmacist based upon assessment of the patient and/or caregiver's ability to self-administer the medication ordered. 09/01/19   Bradly Bienenstock, NP  ondansetron (ZOFRAN) 4 MG/2ML SOLN injection Inject 2 mLs (4 mg total) into the vein every 6 (six) hours as needed for nausea. 09/01/19   Bradly Bienenstock, NP  oxyCODONE 10 MG TABS Take 1 tablet (10 mg total) by mouth every 3 (three) hours as needed for severe pain. 09/01/19   Bradly Bienenstock, NP  polyethylene glycol (MIRALAX / GLYCOLAX) 17 g packet Take 17 g by mouth daily as needed for moderate constipation. 09/01/19   Bradly Bienenstock, NP  promethazine (PHENERGAN) 25 MG/ML injection Inject 1 mL (25 mg total) into the vein every 6 (six) hours as  needed for nausea or vomiting. 09/01/19   Bradly Bienenstock, NP  simethicone (MYLICON) 80 MG chewable tablet Chew 1 tablet (80 mg total) by mouth every 6 (six) hours as needed for flatulence. 09/01/19   Darel Hong D, NP  sodium chloride 0.9 % infusion Inject 75 mLs into the vein continuous. 09/01/19   Darel Hong D, NP  sodium chloride 0.9 % infusion Inject 250 mLs into the vein continuous. 09/01/19   Darel Hong D, NP  sodium chloride 0.9 % SOLN 452 mL with nafcillin 2 g SOLR 12 g Inject 12 g into the vein daily. 09/02/19   Bradly Bienenstock, NP    Physical Exam: Vitals:   09/18/19 1030 09/18/19 1130 09/18/19 1230 09/18/19 1300  BP: (!) 98/54 135/85 112/70 128/76  Pulse: 98 99 99 (!) 103  Resp: (!) '7 13 15 14  '$ Temp:      TempSrc:      SpO2: 100% 100% 99% 100%  Weight:      Height:         Vitals:   09/18/19 1030 09/18/19 1130 09/18/19 1230 09/18/19 1300  BP: (!) 98/54 135/85 112/70  128/76  Pulse: 98 99 99 (!) 103  Resp: (!) '7 13 15 14  '$ Temp:      TempSrc:      SpO2: 100% 100% 99% 100%  Weight:      Height:        Constitutional: NAD, alert and oriented x 3.  Chronically ill-appearing and looks older than stated age Eyes: PERRL, lids and conjunctivae pallor ENMT: Mucous membranes are moist.  Neck: normal, supple, no masses, no thyromegaly Respiratory: clear to auscultation bilaterally, no wheezing, no crackles. Normal respiratory effort. No accessory muscle use.  Cardiovascular:   Mid sternal incision, Regular rate and rhythm, no murmurs / rubs / gallops. No extremity edema. 2+ pedal pulses. No carotid bruits.  Abdomen: no tenderness, no masses palpated. No hepatosplenomegaly. Bowel sounds positive.  Musculoskeletal: no clubbing / cyanosis. No joint deformity upper and lower extremities.  Skin: no rashes, lesions, ulcers. Ecchymoses on both forearms Neurologic: No gross focal neurologic deficit. Psychiatric: Normal mood and affect.   Labs on Admission: I have personally reviewed following labs and imaging studies  CBC: Recent Labs  Lab 09/18/19 1058  WBC 13.1*  NEUTROABS 8.5*  HGB 6.5*  HCT 20.4*  MCV 85.7  PLT 654*   Basic Metabolic Panel: Recent Labs  Lab 09/18/19 1058  NA 136  K 3.0*  CL 111  CO2 13*  GLUCOSE 84  BUN 19  CREATININE 2.00*  CALCIUM 8.2*   GFR: Estimated Creatinine Clearance: 28.1 mL/min (A) (by C-G formula based on SCr of 2 mg/dL (H)). Liver Function Tests: Recent Labs  Lab 09/18/19 1058  AST 10*  ALT 8  ALKPHOS 93  BILITOT 0.9  PROT 7.5  ALBUMIN 1.9*   No results for input(s): LIPASE, AMYLASE in the last 168 hours. No results for input(s): AMMONIA in the last 168 hours. Coagulation Profile: Recent Labs  Lab 09/18/19 1058  INR 2.0*   Cardiac Enzymes: No results for input(s): CKTOTAL, CKMB, CKMBINDEX, TROPONINI in the last 168 hours. BNP (last 3 results) No results for input(s): PROBNP in the last 8760  hours. HbA1C: No results for input(s): HGBA1C in the last 72 hours. CBG: No results for input(s): GLUCAP in the last 168 hours. Lipid Profile: No results for input(s): CHOL, HDL, LDLCALC, TRIG, CHOLHDL, LDLDIRECT in the last 72 hours. Thyroid Function Tests:  No results for input(s): TSH, T4TOTAL, FREET4, T3FREE, THYROIDAB in the last 72 hours. Anemia Panel: No results for input(s): VITAMINB12, FOLATE, FERRITIN, TIBC, IRON, RETICCTPCT in the last 72 hours. Urine analysis:    Component Value Date/Time   COLORURINE YELLOW (A) 08/30/2019 1919   APPEARANCEUR CLEAR (A) 08/30/2019 1919   LABSPEC 1.010 08/30/2019 1919   PHURINE 6.0 08/30/2019 1919   GLUCOSEU NEGATIVE 08/30/2019 1919   HGBUR MODERATE (A) 08/30/2019 [place] NEGATIVE 08/30/2019 [place] 08/30/2019 1919   PROTEINUR 100 (A) 08/30/2019 1919   UROBILINOGEN 0.2 04/27/2013 2330   NITRITE NEGATIVE 08/30/2019 1919   LEUKOCYTESUR SMALL (A) 08/30/2019 1919    Radiological Exams on Admission: DG Chest Port 1 View  Result Date: 09/18/2019 CLINICAL DATA:  Recent valve replacement EXAM: PORTABLE CHEST 1 VIEW COMPARISON:  08/30/2019 FINDINGS: Bilateral patchy interstitial and alveolar airspace opacities. No pleural effusion or pneumothorax. Stable cardiomediastinal silhouette. Interval valvuloplasty. Prior median sternotomy. Prior partially visualized anterior and posterior cervical fusion. IMPRESSION: Bilateral patchy interstitial and alveolar airspace opacities consistent with patient's known history of septic emboli. No significant interval change compared with 08/30/2019. Electronically Signed   By: Kathreen Devoid   On: 09/18/2019 11:36    EKG: Independently reviewed.  Sinus tachycardia Incomplete right bundle branch block  Assessment/Plan Principal Problem:   Endocarditis of tricuspid valve Active Problems:   Opiate abuse, continuous (HCC)   AKI (acute kidney injury) ([place])   Hypokalemia   Anemia in  other chronic diseases classified elsewhere   Chronic hepatitis C without hepatic coma (HCC)    MSSA endocarditis of tricuspid valve Patient is status post redo sternotomy,tricupsid valve replacement, repair PFO on 09/08/19.  Patient was seen by infectious disease at [facility] 5/7 OR cultures of heart valve - NGTD 5/11 repeat blood cultures x2 drawn on 05/11 Recommend to continue nafcillin (anticipate 6 week course of therapy), end date TBD  Continue gentamicin (plan is for 2 weeks tentatively with end date of 5/17).  Patient received a dose in the emergency room at [facility] Continue rifampin po tid  We will request infectious disease consult during this hospitalization   Acute kidney injury Most likely medication induced Patient has been on gentamicin IV for endocarditis and has completed a 2-week course of therapy IV fluid hydration We will consult nephrology    Anemia of chronic disease Patient noted to have a hemoglobin of 6.5g/dl We will transfuse 1 unit of packed RBC during this hospitalization Monitor H&H   History of opiate dependence Patient is at high risk of developing symptoms of withdrawal We will monitor closely during this hospitalization    Hypokalemia Supplement potassium Check magnesium levels   Chronic hepatitis C without mental status changes   Nicotine dependence Smoking cessation has been discussed with patient in detail We will place patient on nicotine transdermal patch 21 mg daily   DVT prophylaxis: SCD Code Status: Full Family Communication: Greater than 50% of time was spent discussing patient's condition and plan of care with her at the bedside.  All questions and concerns have been addressed.   Disposition Plan: Back to previous home environment Consults called: Infectious Disease, Nephrology    Collier Bullock MD [facility]     09/18/2019, 1:59 PM

## 2019-09-18 NOTE — ED Notes (Signed)
Pt unable to sign consent due to drowsiness from medications.

## 2019-09-18 NOTE — ED Notes (Signed)
Pt wanting to leave because not getting the pain meds she wants.  Dr Onalee Hua and requested to come speak with pt.

## 2019-09-18 NOTE — ED Triage Notes (Signed)
Pt is here with a friend who states the pt was at [facility] after having open heart surgery to replace a heart valve due to sepsis infection and had stayed one night with him, states she is not acting right . Pt is lethargic in triage and is not responding verbally.

## 2019-09-18 NOTE — ED Notes (Signed)
Pt sleeping.  nsr on monitor.  prbc's infusing.   No reaction noted.  siderails up x 2

## 2019-09-18 NOTE — ED Notes (Addendum)
Emergent consent for blood done.  Unable to reach father via telephone for consent of blood.  Elige Radon also validated this with this er rn.  Dr Joylene Igo aware of situation concerning consent. Charge nurse Lawyer also aware of situation and in room with pt too.

## 2019-09-18 NOTE — Plan of Care (Signed)

## 2019-09-18 NOTE — ED Notes (Signed)
First set blood cx drawn with IV via [place] by dr Colon Branch.  Phlebotomy called for second set blood cx.

## 2019-09-18 NOTE — ED Notes (Signed)
Pt has only 1 iv that was inserted by er md dr monks.

## 2019-09-18 NOTE — ED Notes (Addendum)
Dr Colon Branch at bedside. quickclot dressing applied to RUE where picc line was previously.  Dr Colon Branch attempting IV via [place]

## 2019-09-18 NOTE — ED Notes (Signed)
Report called to tracey rn floor nurse 

## 2019-09-18 NOTE — ED Notes (Addendum)
Pharmacy aware of abx orders, working on making meds. This RN and phlebotomy unable to obtain second blood cx. Dr Colon Branch aware and would like abx stared when they arrive with only one cx sent.

## 2019-09-18 NOTE — ED Notes (Signed)
Denies CP.

## 2019-09-18 NOTE — ED Triage Notes (Signed)
Pt arrived lethargic and slumped over in wheelchair. Friend that brought pt reports she does used drugs, pupils mildly pinpoint.  This RN states "may need to give narcan" because of decreased mental status and pt opens eyes and states " do not give me narcan".  Pt admits to heroin use.  Left [facility] after valve replacement. Has not been taking abx prescription. Active bleeding under dressing of removed PICC line noted.  Dr Colon Branch aware RR 6-8.

## 2019-09-18 NOTE — Consult Note (Signed)
[facility]  CONSULT NOTE    Date: 09/18/2019                  Patient Name:  Andrea King  MRN: [id number]  DOB: [birth year]  Age / Sex: 44 y.o., female         PCP: Alberteen Spindle, CNM                 Service Requesting Consult: Dr. Joylene Igo                 Reason for Consult: Acute renal failure            History of Present Illness: [name] admitted to [facility] today after leaving AMA from [facility] yesterday. 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 She was getting PO rifampin, gentamicin and nafcillin..    Medications: Outpatient medications: (Not in a hospital admission)   Current medications: Current Facility-Administered Medications  Medication Dose Route Frequency Provider Last Rate Last Admin  . 0.9 %  sodium chloride infusion (Manually program via Guardrails IV Fluids)   Intravenous Once Agbata, Tochukwu, MD      . 0.9 %  sodium chloride infusion   Intravenous Continuous Agbata, Tochukwu, MD      . acetaminophen (OFIRMEV) IV 1,000 mg  1,000 mg Intravenous Q6H Miguel Aschoff., MD   Stopped at 09/18/19 1408  . acetaminophen (TYLENOL) tablet 650 mg  650 mg Oral Q6H PRN Agbata, Tochukwu, MD      . ALPRAZolam Prudy Feeler) tablet 1 mg  1 mg Oral TID Agbata, Tochukwu, MD      . Chlorhexidine Gluconate Cloth 2 % PADS 6 each  6 each Topical Daily Agbata, Tochukwu, MD      . docusate sodium (COLACE) capsule 100 mg  100 mg Oral BID PRN Agbata, Tochukwu, MD      . feeding supplement (ENSURE ENLIVE) (ENSURE ENLIVE) liquid 237 mL  237 mL Oral TID BM Agbata, Tochukwu, MD      . gentamicin (GARAMYCIN) 60 mg in dextrose 5 % 50 mL IVPB  60 mg Intravenous Q24H Miguel Aschoff., MD   Stopped at 09/18/19 1415  . ipratropium-albuterol (DUONEB) 0.5-2.5 (3) MG/3ML nebulizer solution 3 mL  3 mL Nebulization Q6H PRN  Agbata, Tochukwu, MD      . methadone (DOLOPHINE) tablet 10 mg  10 mg Oral Daily Agbata, Tochukwu, MD      . nafcillin 2 g in sodium chloride 0.9 % 100 mL IVPB  2 g Intravenous Q4H Colon Branch, Sarah L., MD      . nafcillin injection 2 g  2 g Intravenous Q8H Agbata, Tochukwu, MD      . ondansetron (ZOFRAN) tablet 4 mg  4 mg Oral Q6H PRN Agbata, Tochukwu, MD       Or  . ondansetron (ZOFRAN) injection 4 mg  4 mg Intravenous Q6H PRN Agbata, Tochukwu, MD      . polyethylene glycol (MIRALAX / GLYCOLAX) packet 17 g  17 g Oral Daily PRN Agbata, Tochukwu, MD      . potassium chloride SA (KLOR-CON) CR tablet 40 mEq  40 mEq Oral Once Agbata, Tochukwu, MD      . rifampin (RIFADIN) capsule 300 mg  300 mg Oral Q12H Agbata, Tochukwu, MD      . simethicone (MYLICON) chewable tablet 80  mg  80 mg Oral Q6H PRN Agbata, Tochukwu, MD       Current Outpatient Medications  Medication Sig Dispense Refill  . acetaminophen (TYLENOL) 325 MG tablet Take 2 tablets (650 mg total) by mouth every 6 (six) hours as needed for mild pain or fever. (Patient not taking: Reported on 09/18/2019)    . ALPRAZolam (XANAX) 1 MG tablet Take 1 tablet (1 mg total) by mouth 3 (three) times daily. (Patient not taking: Reported on 09/18/2019)  0  . Chlorhexidine Gluconate Cloth 2 % PADS Apply 6 each topically daily. (Patient not taking: Reported on 09/18/2019)    . docusate sodium (COLACE) 100 MG capsule Take 1 capsule (100 mg total) by mouth 2 (two) times daily as needed for mild constipation. (Patient not taking: Reported on 09/18/2019) 10 capsule 0  . enoxaparin (LOVENOX) 40 MG/0.4ML injection Inject 0.4 mLs (40 mg total) into the skin daily. (Patient not taking: Reported on 09/18/2019) 0 mL   . feeding supplement, ENSURE ENLIVE, (ENSURE ENLIVE) LIQD Take 237 mLs by mouth 3 (three) times daily between meals. (Patient not taking: Reported on 09/18/2019) 237 mL 12  . fluconazole (DIFLUCAN) 100 MG tablet Take 1 tablet (100 mg total) by mouth daily. (Patient  not taking: Reported on 09/18/2019)    . ipratropium-albuterol (DUONEB) 0.5-2.5 (3) MG/3ML SOLN Take 3 mLs by nebulization every 6 (six) hours as needed. (Patient not taking: Reported on 09/18/2019) 360 mL   . methadone (DOLOPHINE) 10 MG tablet Take 1 tablet (10 mg total) by mouth daily. (Patient not taking: Reported on 09/18/2019) 30 tablet 0  . oxyCODONE 10 MG TABS Take 1 tablet (10 mg total) by mouth every 3 (three) hours as needed for severe pain. (Patient not taking: Reported on 09/18/2019) 30 tablet 0  . polyethylene glycol (MIRALAX / GLYCOLAX) 17 g packet Take 17 g by mouth daily as needed for moderate constipation. (Patient not taking: Reported on 09/18/2019) 14 each 0  . simethicone (MYLICON) 80 MG chewable tablet Chew 1 tablet (80 mg total) by mouth every 6 (six) hours as needed for flatulence. (Patient not taking: Reported on 09/18/2019) 30 tablet 0      Allergies: No Known Allergies    Past Medical History: Past Medical History:  Diagnosis Date  . Anemia   . Asthma   . DJD (degenerative joint disease)   . Hepatitis C   . Substance abuse [facility])      Past Surgical History: Past Surgical History:  Procedure Laterality Date  . INGUINAL HERNIA REPAIR Left 04/28/2013   Procedure: HERNIA REPAIR INGUINAL ADULT with mesh;  Surgeon: Cherylynn Ridges, MD;  Location: [facility] OR;  Service: General;  Laterality: Left;  . TEE WITHOUT CARDIOVERSION N/A 11/18/2015   Procedure: TRANSESOPHAGEAL ECHOCARDIOGRAM (TEE);  Surgeon: Iran Ouch, MD;  Location: [facility] ORS;  Service: Cardiovascular;  Laterality: N/A;  . TEE WITHOUT CARDIOVERSION N/A 12/31/2017   Procedure: TRANSESOPHAGEAL ECHOCARDIOGRAM (TEE);  Surgeon: Dalia Heading, MD;  Location: [facility] ORS;  Service: Cardiovascular;  Laterality: N/A;  . TEE WITHOUT CARDIOVERSION N/A 01/04/2018   Procedure: TRANSESOPHAGEAL ECHOCARDIOGRAM (TEE);  Surgeon: Dalia Heading, MD;  Location: [facility] ORS;  Service: Cardiovascular;  Laterality: N/A;  . tricuspid valve  replacement    . TUMOR EXCISION     left scalpula     Family History: Family History  Problem Relation Age of Onset  . Cancer Mother   . Cancer - Colon Maternal Grandfather   . Diabetes Maternal Grandfather  Social History: Social History   Socioeconomic History  . Marital status: Single    Spouse name: Not on file  . Number of children: Not on file  . Years of education: Not on file  . Highest education level: Not on file  Occupational History  . Occupation: unemployed  Tobacco Use  . Smoking status: Current Every Day Smoker    Types: Cigarettes  . Smokeless tobacco: Never Used  Substance and Sexual Activity  . Alcohol use: No    Alcohol/week: 0.0 standard drinks  . Drug use: Yes    Comment: cocaine,Heroine  . Sexual activity: Yes    Birth control/protection: None  Other Topics Concern  . Not on file  Social History Narrative  . Not on file   Social Determinants of Health   Financial Resource Strain:   . Difficulty of Paying Living Expenses:   Food Insecurity:   . Worried About Programme researcher, broadcasting/film/videounning Out of Food in the Last Year:   . Baristaan Out of Food in the Last Year:   Transportation Needs:   . Freight forwarderLack of Transportation (Medical):   Marland Kitchen. Lack of Transportation (Non-Medical):   Physical Activity:   . Days of Exercise per Week:   . Minutes of Exercise per Session:   Stress:   . Feeling of Stress :   Social Connections:   . Frequency of Communication with Friends and Family:   . Frequency of Social Gatherings with Friends and Family:   . Attends Religious Services:   . Active Member of Clubs or Organizations:   . Attends BankerClub or Organization Meetings:   Marland Kitchen. Marital Status:   Intimate Partner Violence:   . Fear of Current or Ex-Partner:   . Emotionally Abused:   Marland Kitchen. Physically Abused:   . Sexually Abused:      Review of Systems: Review of Systems  Constitutional: Negative.  Negative for chills, diaphoresis, fever, malaise/fatigue and weight loss.  HENT: Negative.   Negative for congestion, ear discharge, ear pain, hearing loss, nosebleeds, sinus pain, sore throat and tinnitus.   Eyes: Negative.  Negative for blurred vision, double vision, photophobia, pain, discharge and redness.  Respiratory: Positive for cough. Negative for hemoptysis, sputum production, shortness of breath, wheezing and stridor.   Cardiovascular: Positive for leg swelling. Negative for chest pain, palpitations, orthopnea, claudication and PND.  Gastrointestinal: Negative.  Negative for abdominal pain, blood in stool, constipation, diarrhea, heartburn, melena, nausea and vomiting.  Genitourinary: Negative.  Negative for dysuria, flank pain, frequency, hematuria and urgency.  Musculoskeletal: Negative.  Negative for back pain, falls, joint pain, myalgias and neck pain.  Skin: Negative.  Negative for itching and rash.  Neurological: Positive for weakness. Negative for dizziness, tingling, tremors, sensory change, speech change, focal weakness, seizures, loss of consciousness and headaches.  Endo/Heme/Allergies: Negative.  Negative for environmental allergies and polydipsia. Does not bruise/bleed easily.  Psychiatric/Behavioral: Negative.  Negative for depression, hallucinations, memory loss, substance abuse and suicidal ideas. The patient is not nervous/anxious and does not have insomnia.     Vital Signs: Blood pressure 128/76, pulse (!) 103, temperature 98.4 F (36.9 C), temperature source Oral, resp. rate 14, height 5\' 2"  (1.575 m), weight 52 kg, SpO2 100 %.  Weight trends: Filed Weights   09/18/19 1026  Weight: 52 kg    Physical Exam: General: NAD,   Head: Normocephalic, atraumatic. Moist oral mucosal membranes  Eyes: Anicteric, PERRL  Neck: Supple, trachea midline  Lungs:  Clear to auscultation  Heart: Regular rate and rhythm, +murmur  Abdomen:  Soft, nontender,   Extremities:  ++ peripheral edema.  Neurologic: Nonfocal, moving all four extremities  Skin: No lesions          Lab results: Basic Metabolic Panel: Recent Labs  Lab 09/18/19 1058  NA 136  K 3.0*  CL 111  CO2 13*  GLUCOSE 84  BUN 19  CREATININE 2.00*  CALCIUM 8.2*    Liver Function Tests: Recent Labs  Lab 09/18/19 1058  AST 10*  ALT 8  ALKPHOS 93  BILITOT 0.9  PROT 7.5  ALBUMIN 1.9*   No results for input(s): LIPASE, AMYLASE in the last 168 hours. No results for input(s): AMMONIA in the last 168 hours.  CBC: Recent Labs  Lab 09/18/19 1058  WBC 13.1*  NEUTROABS 8.5*  HGB 6.5*  HCT 20.4*  MCV 85.7  PLT 713*    Cardiac Enzymes: No results for input(s): CKTOTAL, CKMB, CKMBINDEX, TROPONINI in the last 168 hours.  BNP: Invalid input(s): POCBNP  CBG: No results for input(s): GLUCAP in the last 168 hours.  Microbiology: Results for orders placed or performed during the hospital encounter of 08/30/19  Culture, blood (Routine x 2)     Status: Abnormal   Collection Time: 08/30/19  6:29 PM   Specimen: BLOOD  Result Value Ref Range Status   Specimen Description   Final    BLOOD BLOOD LEFT ARM Performed at [facility], [address] [place], [place] [address]    Special Requests   Final    BOTTLES DRAWN AEROBIC AND ANAEROBIC Blood Culture adequate volume Performed at [facility], [address], [place], [place] [address]    Culture  Setup Time   Final    GRAM POSITIVE COCCI IN BOTH AEROBIC AND ANAEROBIC BOTTLES CRITICAL RESULT CALLED TO, READ BACK BY AND VERIFIED WITH: SCOTT HALL AT 0160 ON 08/31/19 SNG Performed at [facility], [address], [place], [place] [address]    Culture STAPHYLOCOCCUS AUREUS (A)  Final   Report Status 09/02/2019 FINAL  Final   Organism ID, Bacteria STAPHYLOCOCCUS AUREUS  Final      Susceptibility   Staphylococcus aureus - MIC*    CIPROFLOXACIN >=8 RESISTANT Resistant     ERYTHROMYCIN >=8 RESISTANT Resistant     GENTAMICIN <=0.5 SENSITIVE Sensitive     OXACILLIN <=0.25 SENSITIVE Sensitive      TETRACYCLINE >=16 RESISTANT Resistant     VANCOMYCIN 1 SENSITIVE Sensitive     TRIMETH/SULFA <=10 SENSITIVE Sensitive     CLINDAMYCIN >=8 RESISTANT Resistant     RIFAMPIN <=0.5 SENSITIVE Sensitive     Inducible Clindamycin NEGATIVE Sensitive     * STAPHYLOCOCCUS AUREUS  Blood Culture ID Panel (Reflexed)     Status: Abnormal   Collection Time: 08/30/19  6:29 PM  Result Value Ref Range Status   Enterococcus species NOT DETECTED NOT DETECTED Final   Listeria monocytogenes NOT DETECTED NOT DETECTED Final   Staphylococcus species DETECTED (A) NOT DETECTED Final    Comment: CRITICAL RESULT CALLED TO, READ BACK BY AND VERIFIED WITH: SCOTT HALL AT 0713 ON 08/31/19 SNG    Staphylococcus aureus (BCID) DETECTED (A) NOT DETECTED Final    Comment: Methicillin (oxacillin) susceptible Staphylococcus aureus (MSSA). Preferred therapy is anti staphylococcal beta lactam antibiotic (Cefazolin or Nafcillin), unless clinically contraindicated. CRITICAL RESULT CALLED TO, READ BACK BY AND VERIFIED WITH: SCOTT HALL AT 3557 ON 08/31/19 SNG    Methicillin resistance NOT DETECTED NOT DETECTED Final   Streptococcus species NOT  DETECTED NOT DETECTED Final   Streptococcus agalactiae NOT DETECTED NOT DETECTED Final   Streptococcus pneumoniae NOT DETECTED NOT DETECTED Final   Streptococcus pyogenes NOT DETECTED NOT DETECTED Final   Acinetobacter baumannii NOT DETECTED NOT DETECTED Final   Enterobacteriaceae species NOT DETECTED NOT DETECTED Final   Enterobacter cloacae complex NOT DETECTED NOT DETECTED Final   Escherichia coli NOT DETECTED NOT DETECTED Final   Klebsiella oxytoca NOT DETECTED NOT DETECTED Final   Klebsiella pneumoniae NOT DETECTED NOT DETECTED Final   Proteus species NOT DETECTED NOT DETECTED Final   Serratia marcescens NOT DETECTED NOT DETECTED Final   Haemophilus influenzae NOT DETECTED NOT DETECTED Final   Neisseria meningitidis NOT DETECTED NOT DETECTED Final   Pseudomonas aeruginosa NOT  DETECTED NOT DETECTED Final   Candida albicans NOT DETECTED NOT DETECTED Final   Candida glabrata NOT DETECTED NOT DETECTED Final   Candida krusei NOT DETECTED NOT DETECTED Final   Candida parapsilosis NOT DETECTED NOT DETECTED Final   Candida tropicalis NOT DETECTED NOT DETECTED Final    Comment: Performed at [facility], [address] [place], [place] [address]  Blood Culture (routine x 2)     Status: None   Collection Time: 08/30/19  6:57 PM   Specimen: BLOOD  Result Value Ref Range Status   Specimen Description BLOOD BLOOD LEFT ARM  Final   Special Requests   Final    BOTTLES DRAWN AEROBIC AND ANAEROBIC Blood Culture results may not be optimal due to an inadequate volume of blood received in culture bottles   Culture   Final    NO GROWTH 5 DAYS Performed at [facility], [address] [place], [place] [address]    Report Status 09/04/2019 FINAL  Final  Urine culture     Status: Abnormal   Collection Time: 08/30/19  7:19 PM   Specimen: In/Out Cath Urine  Result Value Ref Range Status   Specimen Description   Final    IN/OUT CATH URINE Performed at [facility], [address], [place], [place] [address]    Special Requests   Final    NONE Performed at [facility], [address] [place], [place] [address]    Culture MULTIPLE SPECIES PRESENT, SUGGEST RECOLLECTION (A)  Final   Report Status 08/31/2019 FINAL  Final  Respiratory Panel by RT PCR (Flu A&B, Covid) - Nasopharyngeal Swab     Status: None   Collection Time: 08/30/19  7:19 PM   Specimen: Nasopharyngeal Swab  Result Value Ref Range Status   SARS Coronavirus 2 by RT PCR NEGATIVE NEGATIVE Final    Comment: (NOTE) SARS-CoV-2 target nucleic acids are NOT DETECTED. The SARS-CoV-2 RNA is generally detectable in upper respiratoy specimens during the acute phase of infection. The lowest concentration of SARS-CoV-2 viral copies this assay can detect is 131 copies/mL. A negative  result does not preclude SARS-Cov-2 infection and should not be used as the sole basis for treatment or other patient management decisions. A negative result may occur with  improper specimen collection/handling, submission of specimen other than nasopharyngeal swab, presence of viral mutation(s) within the areas targeted by this assay, and inadequate number of viral copies (<131 copies/mL). A negative result must be combined with clinical observations, patient history, and epidemiological information. The expected result is Negative. Fact Sheet for Patients:  https://www.moore.com/ Fact Sheet for Healthcare Providers:  https://www.young.biz/ This test is not yet ap proved or cleared by the [place] and  has been authorized  for detection and/or diagnosis of SARS-CoV-2 by FDA under an Emergency Use Authorization (EUA). This EUA will remain  in effect (meaning this test can be used) for the duration of the COVID-19 declaration under Section 564(b)(1) of the Act, 21 U.S.C. section 360bbb-3(b)(1), unless the authorization is terminated or revoked sooner.    Influenza A by PCR NEGATIVE NEGATIVE Final   Influenza B by PCR NEGATIVE NEGATIVE Final    Comment: (NOTE) The Xpert Xpress SARS-CoV-2/FLU/RSV assay is intended as an aid in  the diagnosis of influenza from Nasopharyngeal swab specimens and  should not be used as a sole basis for treatment. Nasal washings and  aspirates are unacceptable for Xpert Xpress SARS-CoV-2/FLU/RSV  testing. Fact Sheet for Patients: PinkCheek.be Fact Sheet for Healthcare Providers: GravelBags.it This test is not yet approved or cleared by the [place] FDA and  has been authorized for detection and/or diagnosis of SARS-CoV-2 by  FDA under an Emergency Use Authorization (EUA). This EUA will remain  in effect (meaning this test can be used) for the  duration of the  Covid-19 declaration under Section 564(b)(1) of the Act, 21  U.S.C. section 360bbb-3(b)(1), unless the authorization is  terminated or revoked. Performed at [facility], [address] [place], [place] [address]   MRSA PCR Screening     Status: None   Collection Time: 08/30/19 11:30 PM   Specimen: Nasopharyngeal  Result Value Ref Range Status   MRSA by PCR NEGATIVE NEGATIVE Final    Comment:        The GeneXpert MRSA Assay (FDA approved for NASAL specimens only), is one component of a comprehensive MRSA colonization surveillance program. It is not intended to diagnose MRSA infection nor to guide or monitor treatment for MRSA infections. Performed at [facility], [address] [place], [place] [address]     Coagulation Studies: Recent Labs    09/18/19 1058  LABPROT 22.2*  INR 2.0*    Urinalysis: No results for input(s): COLORURINE, LABSPEC, PHURINE, GLUCOSEU, HGBUR, BILIRUBINUR, KETONESUR, PROTEINUR, UROBILINOGEN, NITRITE, LEUKOCYTESUR in the last 72 hours.  Invalid input(s): APPERANCEUR    Imaging: DG Chest Port 1 View  Result Date: 09/18/2019 CLINICAL DATA:  Recent valve replacement EXAM: PORTABLE CHEST 1 VIEW COMPARISON:  08/30/2019 FINDINGS: Bilateral patchy interstitial and alveolar airspace opacities. No pleural effusion or pneumothorax. Stable cardiomediastinal silhouette. Interval valvuloplasty. Prior median sternotomy. Prior partially visualized anterior and posterior cervical fusion. IMPRESSION: Bilateral patchy interstitial and alveolar airspace opacities consistent with patient's known history of septic emboli. No significant interval change compared with 08/30/2019. Electronically Signed   By: Kathreen Devoid   On: 09/18/2019 11:36      Assessment & Plan: [name] is a 45 y.o. 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 Acute renal failure Baseline creatinine of 0.8. Acute renal failure most likely secondary to endocarditis  2. Proteinuria: with chronic hepatitis C.   3. Hematuria - check serum complements  4. Endocarditis: status post tricuspid valve replacement last week at [facility].  - nafcillin and gentamicin.  - Appreciate ID input.   5. Anemia with renal failure: hemoglobin 6.5. PRBC transfusion scheduled - Check haptoglobin and LDH  LOS: 0 Andrea King 5/17/20212:16 PM

## 2019-09-18 NOTE — ED Notes (Signed)
Spoke with pharmacy. They are mixing abx at this time.

## 2019-09-18 NOTE — ED Provider Notes (Signed)
[facility] Emergency Department Provider Note  ____________________________________________   First MD Initiated Contact with Patient 09/18/19 1021     (approximate)  I have reviewed the triage vital signs and the nursing notes.  History  Chief Complaint Altered Mental Status and Post-op Problem    HPI Andrea King is a 44 y.o. 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  Patient was transferred from [facility] to [facility] at the end of April for MSSA endocarditis with tricuspid valve vegetations and associated bacteremia, needing a TV replacement.  At [facility] she underwent tricuspid valve replacement on 5/7.  Plan was for 6 weeks of nafcillin IV, and 2 weeks of gentamicin.  Unfortunately, it seems she left [facility] AMA yesterday.  She tells me she left because she did not feel like she was being treated well.  They did attempt to give her an Rx for antibiotics upon her discharge, but patient states she has not filled this.    On arrival to the emergency department she is quite somnolent, not apneic, but frequently falls asleep during conversation. She does admit to using heroin last night. When asked why she presented to the ER today she states "because this is near my home".  She reports whole body pain.  She states she has returned because she is now prepared to complete her recommended treatments.  Caveat: hx limited by patient participation, as well as strong suspicion for substance use   Past Medical Hx Past Medical History:  Diagnosis Date  . Anemia   . Asthma   . DJD (degenerative joint disease)   . Hepatitis C     . Substance abuse [facility])     Problem List Patient Active Problem List   Diagnosis Date Noted  . Protein-calorie malnutrition, severe 08/31/2019  . 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 Malnutrition of moderate degree 12/29/2017  . 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 Palliative care by specialist   . Goals of care, counseling/discussion   . Periorbital cellulitis 09/20/2017  . Hypoglycemia 09/20/2017  . Bacteremia due to Staphylococcus   . Sepsis ([place]) 11/13/2015  . Community acquired pneumonia 11/13/2015  . Pneumonia 11/13/2015  . Substance induced mood disorder ([place]) 11/13/2015  . Opiate withdrawal ([place]) 11/13/2015  . Opiate abuse, continuous ([place]) 11/13/2015  . Incarcerated femoral hernia left 04/28/2013    Past Surgical Hx Past Surgical History:  Procedure Laterality Date  . INGUINAL HERNIA REPAIR Left 04/28/2013   Procedure: HERNIA REPAIR INGUINAL ADULT with mesh;  Surgeon: Gwenyth Ober, MD;  Location: [place];  Service: General;  Laterality: Left;  . TEE WITHOUT CARDIOVERSION N/A 11/18/2015   Procedure: TRANSESOPHAGEAL ECHOCARDIOGRAM (TEE);  Surgeon: Wellington Hampshire, MD;  Location: [facility] OR[facility];  Service: Cardiovascular;  Laterality: N/A;  . TEE WITHOUT CARDIOVERSION N/A 12/31/2017   Procedure: TRANSESOPHAGEAL ECHOCARDIOGRAM (TEE);  Surgeon: Teodoro Spray, MD;  Location: [facility] OR[facility];  Service: Cardiovascular;  Laterality: N/A;  . TEE WITHOUT CARDIOVERSION N/A 01/04/2018   Procedure: TRANSESOPHAGEAL ECHOCARDIOGRAM (TEE);  Surgeon: Teodoro Spray, MD;  Location: [facility] OR[facility];  Service: Cardiovascular;  Laterality: N/A;  . tricuspid valve replacement    . TUMOR EXCISION     left scalpula  Medications Prior to Admission medications   Medication Sig Start Date End Date Taking? Authorizing Provider  acetaminophen (TYLENOL) 325 MG tablet Take 2 tablets (650 mg total) by mouth every 6 (six) hours as needed for mild pain or fever. 09/01/19   Bradly Bienenstock, NP  ALPRAZolam Duanne Moron) 1 MG  tablet Take 1 tablet (1 mg total) by mouth 3 (three) times daily. 09/01/19   Bradly Bienenstock, NP  Chlorhexidine Gluconate Cloth 2 % PADS Apply 6 each topically daily. 09/02/19   Bradly Bienenstock, NP  docusate sodium (COLACE) 100 MG capsule Take 1 capsule (100 mg total) by mouth 2 (two) times daily as needed for mild constipation. 09/01/19   Bradly Bienenstock, NP  enoxaparin (LOVENOX) 40 MG/0.4ML injection Inject 0.4 mLs (40 mg total) into the skin daily. 09/02/19   Bradly Bienenstock, NP  feeding supplement, ENSURE ENLIVE, (ENSURE ENLIVE) LIQD Take 237 mLs by mouth 3 (three) times daily between meals. 09/02/19   Bradly Bienenstock, NP  fluconazole (DIFLUCAN) 100 MG tablet Take 1 tablet (100 mg total) by mouth daily. 09/02/19   Darel Hong D, NP  ipratropium-albuterol (DUONEB) 0.5-2.5 (3) MG/3ML SOLN Take 3 mLs by nebulization every 6 (six) hours as needed. 09/01/19   Darel Hong D, NP  methadone (DOLOPHINE) 10 MG tablet Take 1 tablet (10 mg total) by mouth daily. 09/02/19   Darel Hong D, NP  morphine 2 MG/ML injection Inject 1-2 mLs (2-4 mg total) into the vein every 3 (three) hours as needed. 09/01/19   Darel Hong D, NP  nafcillin IVPB Inject 12 g into the vein daily. Indication:  MSSA Bacteremia, Endocarditis First Dose: 09/01/2019 Last Day of Therapy:  To be determined by Infectious Disease Labs - Once weekly:  CBC/D and BMP, Labs - Every other week:  ESR and CRP Method of administration: Elastomeric (Continuous infusion) Method of administration may be changed at the discretion of home infusion pharmacist based upon assessment of the patient and/or caregiver's ability to self-administer the medication ordered. 09/01/19   Bradly Bienenstock, NP  ondansetron (ZOFRAN) 4 MG/2ML SOLN injection Inject 2 mLs (4 mg total) into the vein every 6 (six) hours as needed for nausea. 09/01/19   Bradly Bienenstock, NP  oxyCODONE 10 MG TABS Take 1 tablet (10 mg total) by mouth every 3 (three) hours as needed for  severe pain. 09/01/19   Bradly Bienenstock, NP  polyethylene glycol (MIRALAX / GLYCOLAX) 17 g packet Take 17 g by mouth daily as needed for moderate constipation. 09/01/19   Bradly Bienenstock, NP  promethazine (PHENERGAN) 25 MG/ML injection Inject 1 mL (25 mg total) into the vein every 6 (six) hours as needed for nausea or vomiting. 09/01/19   Bradly Bienenstock, NP  simethicone (MYLICON) 80 MG chewable tablet Chew 1 tablet (80 mg total) by mouth every 6 (six) hours as needed for flatulence. 09/01/19   Darel Hong D, NP  sodium chloride 0.9 % infusion Inject 75 mLs into the vein continuous. 09/01/19   Darel Hong D, NP  sodium chloride 0.9 % infusion Inject 250 mLs into the vein continuous. 09/01/19   Darel Hong D, NP  sodium chloride 0.9 % SOLN 452 mL with nafcillin 2 g SOLR 12 g Inject 12 g into the vein daily. 09/02/19   Bradly Bienenstock, NP    Allergies Patient has no known allergies.  Family Hx Family History  Problem Relation Age of Onset  . Cancer Mother   .  Cancer - Colon Maternal Grandfather   . Diabetes Maternal Grandfather     Social Hx Social History   Tobacco Use  . Smoking status: Current Every Day Smoker    Types: Cigarettes  . Smokeless tobacco: Never Used  Substance Use Topics  . Alcohol use: No    Alcohol/week: 0.0 standard drinks  . Drug use: Yes    Comment: cocaine,Heroine     Review of Systems  Constitutional: Negative for fever. Negative for chills. + whole body pain Eyes: Negative for visual changes. ENT: Negative for sore throat. Cardiovascular: Negative for chest pain. Respiratory: Negative for shortness of breath. Gastrointestinal: Negative for nausea. Negative for vomiting.  Genitourinary: Negative for dysuria. Musculoskeletal: Negative for leg swelling. Skin: Negative for rash. Neurological: Negative for headaches.   Physical Exam  Vital Signs: ED Triage Vitals  Enc Vitals Group     BP 09/18/19 1028 104/69     Pulse Rate 09/18/19  1028 100     Resp 09/18/19 1028 (!) 7     Temp 09/18/19 1028 98.4 F (36.9 C)     Temp Source 09/18/19 1028 Oral     SpO2 09/18/19 1028 100 %     Weight 09/18/19 1026 114 lb 10.2 oz (52 kg)     Height 09/18/19 1026 '5\' 2"'$  (1.575 m)     Head Circumference --      Peak Flow --      Pain Score 09/18/19 1026 10     Pain Loc --      Pain Edu? --      Excl. in [place]? --     Constitutional: Appears chronically ills. Falls asleep in the middle of conversation, but awakens easily to voice.  No apnea.  Suspect intoxication. Head: Normocephalic. Atraumatic. Eyes: Conjunctivae clear. Sclera anicteric. Pupils small, equal and symmetric. Nose: No masses or lesions. No congestion or rhinorrhea. Mouth/Throat: Wearing mask.  Neck: No stridor. Trachea midline.  Cardiovascular: HR low 100s, regular rhythm. Murmur, recent TVR. Extremities well perfused. Midline sternotomy site appears to be well healing.  Respiratory: RR variable, drops to 8-10 with sleeping, but no apnea. Increases appropriately when awake and increasing throughout her stay. Suspect substance related.  Gastrointestinal: Soft. Non-distended. Non-tender.  Genitourinary: Deferred. Musculoskeletal: Area of recent PICC line removal w/ dressing in place to RUE with some bloody guaze in place, but no active bleeding. Neurologic:  Normal speech and language. No gross focal or lateralizing neurologic deficits are appreciated.  Skin: Multiple areas of recent IV sticks. Psychiatric: Mood and affect are appropriate for situation.  EKG  Personally reviewed and interpreted by myself.   Date: 09/18/19 Time: 1011 Rate: 106 Rhythm: sinus Axis: normal Intervals: QRS 118 ms, QTc 478 ms Incomplete RBBB No acute ischemic changes No STEMI    Radiology  Personally reviewed available imaging myself.   CXR - IMPRESSION:  Bilateral patchy interstitial and alveolar airspace opacities  consistent with patient's known history of septic emboli. No    significant interval change compared with 08/30/2019.    Procedures  Procedure(s) performed (including critical care):  Ultrasound ED Peripheral IV (Provider)  Date/Time: 09/18/2019 3:44 PM Performed by: Lilia Pro., MD Authorized by: Lilia Pro., MD   Procedure details:    Indications: multiple failed IV attempts and poor IV access     Skin Prep: chlorhexidine gluconate     Location:  Left AC   Angiocath:  20 G   Bedside Ultrasound Guided: Yes     Patient  tolerated procedure without complications: Yes     Dressing applied: Yes       Initial Impression / Assessment and Plan / MDM / ED Course  44 y.o. female with quite an extensive and unfortunate past medical history including IV drug use with multiple resultant complications including spinal osteomyelitis, endocarditis and bacteremia s/p TV replacement in 2017 (and now 2021) who presents to the emergency department for feeling generally unwell and for return for further treatment after leaving an extensive hospitalization at [facility] yesterday.  Patient was transferred from [facility] to [facility] at the end of April for MSSA endocarditis with tricuspid valve vegetations and associated bacteremia, needing a TV replacement.  At [facility] she underwent tricuspid valve replacement on 5/7.  Plan was for 6 weeks of nafcillin IV, and 2 weeks of gentamicin. Unfortunately, it seems she left [facility] AMA yesterday.   She presents to [facility] today, saying she is ready to be readmitted and treated. She does admit to heroin use yesterday, though her exam today is concerning for more recent use. States she snorted it and denies IV drug use.   Will obtain repeat labs here, including cultures. Able to review her [facility] documentation via care everywhere, and chart reviewed her stay here. Will reorder antibiotics based on this review, including nafcillin and gentamicin, and ultimately plan for admission. .   She is complaining of whole body pain, will treat with  non-narcotics as her exam is concerning for recent opiate use. No indication for Narcan at this time.   Labs here reveal Hgb 6.5 (from upper 7s/low 8s). INR 2. Creatinine 2 from normal previously. XR c/w known hx of septic emboli.   Discussed w/ hospitalist for admission.    _______________________________   As part of my medical decision making I have reviewed available labs, radiology tests, reviewed old records/performed extensive chart review.    Final Clinical Impression(s) / ED Diagnosis  Final diagnoses:  AKI (acute kidney injury) ([place])  H/O tricuspid valve replacement  Bacteremia  Substance use       Note:  This document was prepared using Dragon voice recognition software and may include unintentional dictation errors.   Lilia Pro., MD 09/18/19 [phone]

## 2019-09-18 NOTE — ED Notes (Signed)
abx have not arrived from pharmacy. Will start when available.

## 2019-09-18 NOTE — ED Notes (Signed)
Pt talking on cell phone with tv on.   Pain meds given.  Sinus tach on monitor.  md in with pt talking to her about signing out ama.

## 2019-09-18 NOTE — Consult Note (Addendum)
NAME: Andrea King  DOB: [birth year]  MRN: [id number]  Date/Time: 09/18/2019 8:09 PM  REQUESTING PROVIDER Subjective:  REASON FOR CONSULT:  ? Andrea King is a 44 y.o. 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 Patient was  in [facility] between 08/06/2019 until 08/08/2019 when she left AMA.  During that hospitalization she had MSSA bacteremia and TEE showed an intra-atrial vegetation.  ID had seen her and they wanted cardiothoracic surgeon to evaluate her when she left the hospital.  She was sent on p.o. doxycycline. She came to [facility] on 08/30/19 and was transferred to [facility] . She underwent redo TV replacement on 5/7.along with PFO repair. Vegetations found on old PFO repair material and 2 on prosthetic tricuspid valve. She also had b/l septic emboli to the lungs  Culture from the valve was MSSA She was on nafcillin and genta, and the later stopped due to AKI. Pt left AMA on 5/15 unclear reason and she is now at [facility] brought in by her friend    Medical history Patient has complicated infectious disease history MRSA bacteremia septic emboli to the lungs in 2017 Left AMA Strep sanguis bacteremia with tricuspid endocarditis on 03/02/2016 and had tricuspid valve repair annuloplasty performed as well as PFO closure at [facility]  August 2019 fever and cervical spine pain with Serratia bacteremia and C5-C6 discitis.  Left AMA before completion of treatment.  03/03/2018 presents to [facility] again with fever.  MRI showed progressing osteomyelitis discitis. Seen by neurosurgery and  transferred to [facility] in [place] between 03/05/2018 until 05/03/2018.  BC 11/2, 11/3, 11/5 MRSA BC 11/6 enterococcus fecalis- S to amp BC 11/8, 11/9, 11/11 NG BC 11/17 MRSA BC 11/18, 11/19, 11/21,11/22 NG BC 11/28 serratia ( S to PIP tazo, Cefepime, cipro R to ceftriaxone   [facility] 12/2,12/11, 12/18 and 12/27 NG  Underwent C5-C6 corpectomy on 03/17/2018 nd C4 C7 fusion.  Tissue culture was negative. On 04/06/2018 was taken back for surgery for hardware migration and unstable cervical spine and had posterior fusion intervention.  Culture taken from the tissue then was also negative. Patient was initially on vancomycin and ceftaroline and the latter was stopped because of AKI. She had intermittent fever during that hospitalization and multiple blood cultures were done and intermittently it was positive for various organisms like Serratia and enterococcus raising suspicion of manipulation of the PICC line  Bilateral septic arthritis of the knee status post washout and MRSA on the left knee November 2019.  She also had low back pain and was noted to have lumbar osteomyelitis and phlegmon on the MRI of the lumbar spine.  This was not amenable to IR drainage.  She completed vancomycin on 05/03/2018 was sent home with suppressive doxycycline indefinitely.  She was admitted to [facility] again in February 2020 between 06/15/2018 and 06/18/2018.  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  She decided to leave AMA again as she was found to have a Careers adviser and had an unknown fluid drawn after having a visitor. She was later  admitted to [facility] in end of February with negative blood cultures but progressing lumbar spine osteomyelitis thought to be due to MRSA. ID saw her and neuro IR did aspiration without bone biopsy in 3 to.  Aspiration culture was negative and so patient was transitioned from IV Vanco and cefepime to p.o. clindamycin and ciprofloxacin for 6 weeks with a follow-up with the ID clinic  and long-term suppressive treatment with Doxy.  .  She saw ID on a televisit June 2020 and and because of side effects to Doxy it was stopped and she was put on clindamycin 300 mg 3 times a day. Past Medical History:  Diagnosis Date  . Anemia   . Asthma   . DJD (degenerative joint disease)   . Hepatitis C   . Substance abuse [facility])     Past Surgical History:  Procedure Laterality Date  . INGUINAL HERNIA REPAIR Left 04/28/2013   Procedure: HERNIA REPAIR INGUINAL ADULT with mesh;  Surgeon: Cherylynn Ridges, MD;  Location: [facility] OR;  Service: General;  Laterality: Left;  . TEE WITHOUT CARDIOVERSION N/A 11/18/2015   Procedure: TRANSESOPHAGEAL ECHOCARDIOGRAM (TEE);  Surgeon: Iran Ouch, MD;  Location: [facility] ORS;  Service: Cardiovascular;  Laterality: N/A;  . TEE WITHOUT CARDIOVERSION N/A 12/31/2017   Procedure: TRANSESOPHAGEAL ECHOCARDIOGRAM (TEE);  Surgeon: Dalia Heading, MD;  Location: [facility] ORS;  Service: Cardiovascular;  Laterality: N/A;  . TEE WITHOUT CARDIOVERSION N/A 01/04/2018   Procedure: TRANSESOPHAGEAL ECHOCARDIOGRAM (TEE);  Surgeon: Dalia Heading, MD;  Location: [facility] ORS;  Service: Cardiovascular;  Laterality: N/A;  . tricuspid valve replacement    . TUMOR EXCISION     left scalpula    Social History   Socioeconomic History  . Marital status: Single    Spouse name: Not on file  . Number of children: Not on file  . Years of education: Not on file  . Highest education level: Not on file  Occupational History  . Occupation: unemployed  Tobacco Use  . Smoking status: Current Every Day Smoker    Types: Cigarettes  . Smokeless tobacco: Never Used  Substance and Sexual Activity  . Alcohol use: No    Alcohol/week: 0.0 standard drinks  . Drug use: Yes    Comment: cocaine,Heroine  . Sexual activity: Yes    Birth control/protection: None  Other Topics Concern  . Not on file  Social History Narrative  . Not on file   Social Determinants of Health   Financial Resource  Strain:   . Difficulty of Paying Living Expenses:   Food Insecurity:   . Worried About Programme researcher, broadcasting/film/video in the Last Year:   . Barista in the Last Year:   Transportation Needs:   . Freight forwarder (Medical):   Marland Kitchen Lack of Transportation (Non-Medical):   Physical Activity:   . Days of Exercise per Week:   . Minutes of Exercise per Session:   Stress:   . Feeling of Stress :   Social Connections:   . Frequency of Communication with Friends and Family:   . Frequency of Social Gatherings with Friends and Family:   . Attends Religious Services:   . Active Member of Clubs or Organizations:   . Attends Banker Meetings:   Marland Kitchen Marital Status:   Intimate Partner Violence:   . Fear of Current or Ex-Partner:   . Emotionally Abused:   Marland Kitchen Physically Abused:   . Sexually Abused:     Family History  Problem Relation Age of Onset  . Cancer Mother   . Cancer - Colon Maternal Grandfather   . Diabetes Maternal Grandfather    No Known Allergies  ? Current Facility-Administered Medications  Medication Dose Route Frequency Provider Last Rate Last Admin  . 0.9 %  sodium chloride infusion (Manually program via Guardrails IV Fluids)   Intravenous Once Agbata, Tochukwu, MD      . 0.9 %  sodium chloride infusion   Intravenous Continuous Agbata, Tochukwu, MD      . acetaminophen (OFIRMEV) IV 1,000 mg  1,000 mg Intravenous Q6H Miguel AschoffMonks, Sarah L., MD   Stopped at 09/18/19 1408  . acetaminophen (TYLENOL) tablet 650 mg  650 mg Oral Q6H PRN Agbata, Tochukwu, MD      . ALPRAZolam Prudy Feeler(XANAX) tablet 1 mg  1 mg Oral TID Agbata, Tochukwu, MD   1 mg at 09/18/19 1554  . Chlorhexidine Gluconate Cloth 2 % PADS 6 each  6 each Topical Daily Agbata, Tochukwu, MD      . docusate sodium (COLACE) capsule 100 mg  100 mg Oral BID PRN Agbata, Tochukwu, MD      . feeding supplement (ENSURE ENLIVE) (ENSURE ENLIVE) liquid 237 mL  237 mL Oral TID BM Agbata, Tochukwu, MD      . HYDROmorphone (DILAUDID)  injection 1 mg  1 mg Intravenous Q4H PRN Agbata, Tochukwu, MD   1 mg at 09/18/19 1547  . ipratropium-albuterol (DUONEB) 0.5-2.5 (3) MG/3ML nebulizer solution 3 mL  3 mL Nebulization Q6H PRN Agbata, Tochukwu, MD      . nafcillin 12 g in sodium chloride 0.9 % 500 mL continuous infusion  12 g Intravenous Q24H Aleda GranaZeigler, Dustin G, RPH      . nicotine (NICODERM CQ - dosed in mg/24 hours) patch 21 mg  21 mg Transdermal Daily Agbata, Tochukwu, MD      . ondansetron (ZOFRAN) tablet 4 mg  4 mg Oral Q6H PRN Agbata, Tochukwu, MD       Or  . ondansetron (ZOFRAN) injection 4 mg  4 mg Intravenous Q6H PRN Agbata, Tochukwu, MD      . polyethylene glycol (MIRALAX / GLYCOLAX) packet 17 g  17 g Oral Daily PRN Agbata, Tochukwu, MD      . rifampin (RIFADIN) capsule 300 mg  300 mg Oral Q12H Agbata, Tochukwu, MD   300 mg at 09/18/19 1631  . simethicone (MYLICON) chewable tablet 80 mg  80 mg Oral Q6H PRN Agbata, Tochukwu, MD         Abtx:  Anti-infectives (From admission, onward)   Start     Dose/Rate Route Frequency Ordered Stop   09/18/19 1800  nafcillin 12 g in sodium chloride 0.9 % 500 mL continuous infusion     12 g 20.8 mL/hr over 24 Hours Intravenous Every 24 hours 09/18/19 1422     09/18/19 1500  nafcillin injection 2 g  Status:  Discontinued     2 g Intravenous Every 8 hours 09/18/19 1356 09/18/19 1422   09/18/19 1500  rifampin (RIFADIN) capsule 300 mg     300 mg Oral Every 12 hours 09/18/19 1356     09/18/19 1200  gentamicin (GARAMYCIN) 60 mg in dextrose 5 % 50 mL IVPB  Status:  Discontinued     60 mg 103 mL/hr over 30 Minutes Intravenous Every 24 hours 09/18/19 1123 09/18/19 1418   09/18/19 1145  nafcillin 2 g in sodium chloride 0.9 % 100 mL IVPB  Status:  Discontinued  2 g 200 mL/hr over 30 Minutes Intravenous Every 4 hours 09/18/19 1104 09/18/19 1422   09/18/19 1145  gentamicin (GARAMYCIN) IVPB 60 mg  Status:  Discontinued     60 mg 100 mL/hr over 30 Minutes Intravenous Every 24 hours 09/18/19  1104 09/18/19 1123      REVIEW OF SYSTEMS:  Const: negative fever, negative chills,  Eyes: negative diplopia or visual changes, negative eye pain ENT: negative coryza, negative sore throat Resp: negative cough, hemoptysis, dyspnea Cards: has chest pain, palpitations, lower extremity edema GU: negative for frequency, dysuria and hematuria GI: Negative for abdominal pain, has diarrhea, bleeding, constipation Skin: negative for rash and pruritus Heme: negative for easy bruising and gum/nose bleeding MS: generalized weakness  Neurolo:negative for headaches, dizziness, vertigo, memory problems  Psych anxiety, depression  Endocrine: negative for thyroid, diabetes Allergy/Immunology- negative for any medication or food allergies  Objective:  VITALS:  BP 105/65 (BP Location: Left Arm)   Pulse 91   Temp 97.9 F (36.6 C) (Oral)   Resp 17   Ht 5\' 2"  (1.575 m)   Wt 51 kg   LMP  (LMP Unknown)   SpO2 100%   BMI 20.58 kg/m  PHYSICAL EXAM:  General: Alert, cooperative, no distress, appears stated age. pale Head: Normocephalic, without obvious abnormality, atraumatic. Eyes: Conjunctivae clear, anicteric sclerae. Pupils are equal ENT Nares normal. No drainage or sinus tenderness. Oral cavity poor dentition Neck: Supple, symmetrical, no adenopathy, thyroid: non tender no carotid bruit and no JVD. Back: No CVA tenderness. Lungs: b/la ir entry- decreased bases Heart: s1s2 Sternal surgical site healed well. Abdomen: Soft, non-tender,mild distension Bowel sounds normal. No masses Extremities: atraumatic, no cyanosis. No edema. No clubbing Skin: No rashes or lesions. Or bruising Lymph: Cervical, supraclavicular normal. Neurologic: Grossly non-focal Pertinent Labs Lab Results CBC    Component Value Date/Time   WBC 13.1 (H) 09/18/2019 1058   RBC 2.38 (L) 09/18/2019 1058   HGB 6.5 (L) 09/18/2019 1058   HCT 20.4 (L) 09/18/2019 1058   PLT 713 (H) 09/18/2019 1058   MCV 85.7 09/18/2019  1058   MCH 27.3 09/18/2019 1058   MCHC 31.9 09/18/2019 1058   RDW 22.8 (H) 09/18/2019 1058   LYMPHSABS 2.9 09/18/2019 1058   MONOABS 1.0 09/18/2019 1058   EOSABS 0.2 09/18/2019 1058   BASOSABS 0.2 (H) 09/18/2019 1058    CMP Latest Ref Rng & Units 09/18/2019 09/01/2019 08/31/2019  Glucose 70 - 99 mg/dL 84 89 09/02/2019)  BUN 6 - 20 mg/dL 19 15 20   Creatinine 0.44 - 1.00 mg/dL 786(V) 6.72(C  Sodium 135 - 145 mmol/L 136 133(L) 135  Potassium 3.5 - 5.1 mmol/L 3.0(L) 4.0 4.2  Chloride 98 - 111 mmol/L 111 104 104  CO2 22 - 32 mmol/L 13(L) 21(L) 21(L)  Calcium 8.9 - 10.3 mg/dL 8.2(L) 7.4(L) 7.2(L)  Total Protein 6.5 - 8.1 g/dL 7.5 6.0(L) 7.3  Total Bilirubin 0.3 - 1.2 mg/dL 0.9 0.3 0.5  Alkaline Phos 38 - 126 U/L 93 88 104  AST 15 - 41 U/L 10(L) 21 34  ALT 0 - 44 U/L 8 9 17       Microbiology: No results found for this or any previous visit (from the past 240 hour(s)).  IMAGING RESULTS:  I have personally reviewed the films ?  Impression/Recommendation ? ?Recent MSSA bacteremia with tricuspid valve endocarditis and septic emboli since 08/06/19 with stay at [facility]/ [facility]-then back to [facility] - AMA twice- non compliant Last transfer to [facility] on 4/30  and underwent redo tricuspid valve replacement and PFO repair Valve culture from 09/08/19 is MSSA Pt is on nafcillin + rifampin Left AMA on 5/15 from [facility] and is here now Need 6 weeks of antibiotic   Anemia- getting PRBC  AKI_ avoid gentamycin   L5-S1 discitis/OM presacral abscess as noted at [facility] 4/421  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 Suboxone? So that she will stay and complete her antibiotics    Cervical and lumbar spine osteomyelitis In October/November/December 2019 with Serratia bacteremia, MRSA, Enterococcus C4-T2 fusion  HCV RNA negative, reactive ab  Discussed with patient, requesting provider Note:  This document was prepared using Dragon voice recognition software and may include unintentional dictation errors.

## 2019-09-18 NOTE — ED Notes (Signed)
Assisted to bathroom by other RN. Urine was not saved.

## 2019-09-18 NOTE — ED Notes (Signed)
Pt resting quietly.

## 2019-09-18 NOTE — Progress Notes (Signed)
Pt received to room 248 from ED.  Report taken from Amy, RN.  Pt oriented to room and call bell.  Pt agitated and asking/demanding pain meds or "I will leave".  BP okay so will give pain med.  See assessment, vs's and strip.  Pt c/o 9/10 rib pain.

## 2019-09-19 DIAGNOSIS — E872 Acidosis, unspecified: Secondary | ICD-10-CM | POA: Diagnosis present

## 2019-09-19 DIAGNOSIS — F1110 Opioid abuse, uncomplicated: Secondary | ICD-10-CM

## 2019-09-19 DIAGNOSIS — F1990 Other psychoactive substance use, unspecified, uncomplicated: Secondary | ICD-10-CM

## 2019-09-19 DIAGNOSIS — Z952 Presence of prosthetic heart valve: Secondary | ICD-10-CM

## 2019-09-19 DIAGNOSIS — I2690 Septic pulmonary embolism without acute cor pulmonale: Secondary | ICD-10-CM

## 2019-09-19 DIAGNOSIS — R7881 Bacteremia: Secondary | ICD-10-CM

## 2019-09-19 DIAGNOSIS — B182 Chronic viral hepatitis C: Secondary | ICD-10-CM

## 2019-09-19 LAB — BASIC METABOLIC PANEL
Anion gap: 9 (ref 5–15)
BUN: 21 mg/dL — ABNORMAL HIGH (ref 6–20)
CO2: 13 mmol/L — ABNORMAL LOW (ref 22–32)
Calcium: 7.8 mg/dL — ABNORMAL LOW (ref 8.9–10.3)
Chloride: 115 mmol/L — ABNORMAL HIGH (ref 98–111)
Creatinine, Ser: 1.82 mg/dL — ABNORMAL HIGH (ref 0.44–1.00)
GFR calc Af Amer: 38 mL/min — ABNORMAL LOW (ref >60)
GFR calc non Af Amer: 33 mL/min — ABNORMAL LOW (ref >60)
Glucose, Bld: 104 mg/dL — ABNORMAL HIGH (ref 70–99)
Potassium: 3 mmol/L — ABNORMAL LOW (ref 3.5–5.1)
Sodium: 137 mmol/L (ref 135–145)

## 2019-09-19 LAB — LACTATE DEHYDROGENASE: LDH: 236 U/L — ABNORMAL HIGH (ref 98–192)

## 2019-09-19 LAB — CBC
HCT: 20.1 % — ABNORMAL LOW (ref 36.0–46.0)
Hemoglobin: 7 g/dL — ABNORMAL LOW (ref 12.0–15.0)
MCH: 28.6 pg (ref 26.0–34.0)
MCHC: 34.8 g/dL (ref 30.0–36.0)
MCV: 82 fL (ref 80.0–100.0)
Platelets: 491 10*3/uL — ABNORMAL HIGH (ref 150–400)
RBC: 2.45 MIL/uL — ABNORMAL LOW (ref 3.87–5.11)
RDW: 21.2 % — ABNORMAL HIGH (ref 11.5–15.5)
WBC: 10.5 10*3/uL (ref 4.0–10.5)
nRBC: 0 % (ref 0.0–0.2)

## 2019-09-19 LAB — C DIFFICILE QUICK SCREEN W PCR REFLEX
C Diff antigen: NEGATIVE
C Diff interpretation: NOT DETECTED
C Diff toxin: NEGATIVE

## 2019-09-19 LAB — TYPE AND SCREEN
ABO/RH(D): O POS
Antibody Screen: NEGATIVE
Unit division: 0

## 2019-09-19 LAB — BPAM RBC
Blood Product Expiration Date: 202106112359
ISSUE DATE / TIME: 202105171800
Unit Type and Rh: 5100

## 2019-09-19 LAB — MAGNESIUM: Magnesium: 2 mg/dL (ref 1.7–2.4)

## 2019-09-19 MED ORDER — ASPIRIN EC 81 MG PO TBEC
81.0000 mg | DELAYED_RELEASE_TABLET | Freq: Every day | ORAL | Status: DC
  Administered 2019-09-19 – 2019-09-26 (×8): 81 mg via ORAL
  Filled 2019-09-19 (×8): qty 1

## 2019-09-19 MED ORDER — POTASSIUM CHLORIDE 20 MEQ PO PACK
40.0000 meq | PACK | ORAL | Status: DC
  Filled 2019-09-19: qty 2

## 2019-09-19 MED ORDER — STERILE WATER FOR INJECTION IV SOLN
INTRAVENOUS | Status: DC
  Filled 2019-09-19 (×5): qty 850

## 2019-09-19 NOTE — Progress Notes (Signed)
[facility]  PROGRESS NOTE  JERICCA RUSSETT WUJ:[id number] DOB: November 12, 1975 DOA: 09/18/2019 PCP: Alberteen Spindle, CNM Admit date - 09/18/2019   Admitting Physician Lucile Shutters, MD  Outpatient Primary MD for the patient is Alberteen Spindle, CNM  LOS - 1 Brief Narrative   Andrea King is a 44 y.o. 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  Patient left AGAINST MEDICAL ADVICE for unclear reason and presented to [facility] as her friend reported patient was not (acting right).    Patient seemed to be lethargic and not responding verbally in the ED, with pinpoint pupils, however patient was able to decline Narcan offered in the ED.  She admits to not taking any of the antibiotics prescribed when she left AGAINST MEDICAL ADVICE a few days ago.    In the ED she was afebrile, heart rate 103, blood pressure 98/54.  Lab work notable for UDS positive for opioids, benzodiazepine.  WBC 13.1, hemoglobin 6.5, platelets 713, potassium 3, creatinine 2, CO2 13, procalcitonin 0.13, BNP 329. Chest x-ray showed bilateral patchy interstitial/alveolar airspace opacities consistent with known history of septic emboli with no interval change.  Blood cultures were obtained and patient was restarted on IV nafcillin and gentamicin in the ED.  Hospital course: ID was consulted on admission and recommended continuing nafcillin and rifampin and to discontinue gentamicin due to AKI.  Allergy was also consulted  for AKI.   Subjective  Today she complains of pain all over, some nausea, decreased appetite.  Also she states she feels very "gassy"  A & P    MSSA bacteremia and tricuspid valve endocarditis and septic pulmonary emboli in the setting of IV drug use in patient who left AGAINST MEDICAL ADVICE after recent tricuspid valve redo.  Currently remains afebrile, leukocytosis is down trended, not requiring any oxygen.  Has left AMA twice (from [facility] on 5/15, as well as [facility] on 4/30) -Continue nafcillin, rifampin as recommended by ID, needs 6 total weeks -Monitor repeat blood cultures-monitor fever curve, daily CBC  L5-S1 discitis/OM  Anemia.  Hemoglobin 6.5 on admission and improved to 7 after a unit current signs of symptoms of bleeding.  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  Likely multifactorial etiology related to gentamicin as well as untreated endocarditis. 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 No current back pain.   Polysubstance abuse (opioids, tobacco and benzodiazepines). -Dilaudid 1 mg every 4 hours as needed severe pain  -Xanax 1 mg 3 times daily -Tylenol every 6 hours as needed mild pain -Continue methadone -Nicotine patch in place  Status post redo sternotomy intracuspid valve replacement and PFO repair (09/08/2019). Postop TTE on 5/10 at outside hospital showed preserved EF with mild transvalvular regurgitation of prosthetic valve -CT surgery recommended aspirin daily will resume given no bleeding and recent valve repair and high clot risk  Nausea, abdominal  discomfort.  Symptoms seem consistent with gas.  We will try supportive care with simethicone as needed,  if persists may warrant further imaging.  Abdominal imaging at outside hospital showed hepatic congestion and some gallbladder thickening LFTs here unremarkable.  Bilirubin unremarkable.  Negative Murphy sign on exam -Simethicone as needed -Zofran as needed -Closely monitor  Chronic hepatitis C without mental status changes.  HCV antibody positive and HCVRNA negative    Family Communication  : None  Code Status : Full  Disposition Plan  :  Patient is from home. Anticipated d/c date: >3 days. Barriers to d/c or necessity for inpatient status:  Consults  : Factious disease, nephrology  Procedures  : 5/7-redo sternotomy, tricuspid valve replacement, repair PFO (  DVT Prophylaxis  :  SCDs  Lab Results  Component Value Date   PLT 491 (H) 09/19/2019    Diet :  Diet Order            Diet 2 gram sodium Room service appropriate? Yes; Fluid consistency: Thin  Diet effective now               Inpatient Medications Scheduled Meds: . sodium chloride   Intravenous Once  . ALPRAZolam  1 mg Oral TID  . Chlorhexidine Gluconate Cloth  6 each Topical Daily  . feeding supplement (ENSURE ENLIVE)  237 mL Oral TID BM  . nicotine  21 mg Transdermal Daily  . rifampin  300 mg Oral Q12H   Continuous Infusions: . nafcillin (NAFCIL) continuous infusion 12 g (09/19/19 1723)  . sterile water 850 mL with potassium chloride 40 mEq, sodium bicarbonate 150 mEq infusion 75 mL/hr at 09/19/19 1009   PRN Meds:.acetaminophen, docusate sodium, HYDROmorphone (DILAUDID) injection, ipratropium-albuterol, ondansetron **OR** ondansetron (ZOFRAN) IV, polyethylene glycol, simethicone  Antibiotics  :   Anti-infectives (From admission, onward)   Start     Dose/Rate Route Frequency Ordered Stop   09/18/19 1800  nafcillin 12 g in sodium chloride 0.9 % 500 mL continuous infusion     12 g 20.8 mL/hr over 24 Hours Intravenous Every 24 hours 09/18/19 1422     09/18/19 1500  nafcillin injection 2 g  Status:   Discontinued     2 g Intravenous Every 8 hours 09/18/19 1356 09/18/19 1422   09/18/19 1500  rifampin (RIFADIN) capsule 300 mg     300 mg Oral Every 12 hours 09/18/19 1356     09/18/19 1200  gentamicin (GARAMYCIN) 60 mg in dextrose 5 % 50 mL IVPB  Status:  Discontinued     60 mg 103 mL/hr over 30 Minutes Intravenous Every 24 hours 09/18/19 1123 09/18/19 1418   09/18/19 1145  nafcillin 2 g in sodium chloride 0.9 % 100 mL IVPB  Status:  Discontinued     2 g 200 mL/hr over 30 Minutes Intravenous Every 4 hours 09/18/19 1104 09/18/19 1422   09/18/19 1145  gentamicin (GARAMYCIN) IVPB 60 mg  Status:  Discontinued     60 mg 100 mL/hr over 30 Minutes Intravenous Every 24 hours 09/18/19 1104 09/18/19 1123       Objective   Vitals:   09/19/19 0541 09/19/19 0833 09/19/19 1149 09/19/19 1505  BP: 101/61 115/73 140/84 115/64  Pulse: 89 88 95 92  Resp:  17 17 17   Temp: 98.5 F (36.9 C) 98.1 F (36.7 C) 97.7 F (36.5 C) 97.8 F (36.6 C)  TempSrc: Oral Oral    SpO2: 100% 100% 100% 100%  Weight: 51.3 kg  Height:        SpO2: 100 % O2 Flow Rate (L/min): 0 L/min  Wt Readings from Last 3 Encounters:  09/19/19 51.3 kg  09/01/19 52.4 kg  06/15/18 51.3 kg     Intake/Output Summary (Last 24 hours) at 09/19/2019 1832 Last data filed at 09/19/2019 1800 Gross per 24 hour  Intake 3285.42 ml  Output 2775 ml  Net 510.42 ml    Physical Exam:     Awake Alert, Oriented X 3, Normal affect No new F.N deficits,  [place].AT, Normal respiratory effort on room air, CTAB RRR, systolic click heard Decreased bowel sounds abd Soft, No tenderness with palpation, No rebound, guarding or rigidity. No Cyanosis, No new Rash or bruise     I have personally reviewed the following:   Data Reviewed:  CBC Recent Labs  Lab 09/18/19 1058 09/19/19 0021  WBC 13.1* 10.5  HGB 6.5* 7.0*  HCT 20.4* 20.1*  PLT 713* 491*  MCV 85.7 82.0  MCH 27.3 28.6  MCHC 31.9 34.8  RDW 22.8* 21.2*  LYMPHSABS 2.9  --    MONOABS 1.0  --   EOSABS 0.2  --   BASOSABS 0.2*  --     Chemistries  Recent Labs  Lab 09/18/19 1058 09/19/19 0004 09/19/19 0021  NA 136  --  137  K 3.0*  --  3.0*  CL 111  --  115*  CO2 13*  --  13*  GLUCOSE 84  --  104*  BUN 19  --  21*  CREATININE 2.00*  --  1.82*  CALCIUM 8.2*  --  7.8*  MG  --  2.0  --   AST 10*  --   --   ALT 8  --   --   ALKPHOS 93  --   --   BILITOT 0.9  --   --    ------------------------------------------------------------------------------------------------------------------ No results for input(s): CHOL, HDL, LDLCALC, TRIG, CHOLHDL, LDLDIRECT in the last 72 hours.  No results found for: HGBA1C ------------------------------------------------------------------------------------------------------------------ No results for input(s): TSH, T4TOTAL, T3FREE, THYROIDAB in the last 72 hours.  Invalid input(s): FREET3 ------------------------------------------------------------------------------------------------------------------ No results for input(s): VITAMINB12, FOLATE, FERRITIN, TIBC, IRON, RETICCTPCT in the last 72 hours.  Coagulation profile Recent Labs  Lab 09/18/19 1058  INR 2.0*    No results for input(s): DDIMER in the last 72 hours.  Cardiac Enzymes No results for input(s): CKMB, TROPONINI, MYOGLOBIN in the last 168 hours.  Invalid input(s): CK ------------------------------------------------------------------------------------------------------------------    Component Value Date/Time   BNP 329.0 (H) 09/18/2019 1058    Micro Results Recent Results (from the past 240 hour(s))  Culture, blood (routine x 2)     Status: None (Preliminary result)   Collection Time: 09/18/19 10:58 AM   Specimen: BLOOD LEFT ARM  Result Value Ref Range Status   Specimen Description BLOOD LEFT ARM  Final   Special Requests   Final    BOTTLES DRAWN AEROBIC AND ANAEROBIC Blood Culture adequate volume   Culture   Final    NO GROWTH < 24 HOURS  Performed at [facility], [address] [place], [place] [address]    Report Status PENDING  Incomplete  Culture, blood (routine x 2)     Status: None (Preliminary result)   Collection Time: 09/18/19 12:20 PM   Specimen: BLOOD  Result Value Ref Range Status   Specimen Description BLOOD RIGHT [facility]  Final   Special Requests   Final    BOTTLES DRAWN AEROBIC ONLY Blood  Culture results may not be optimal due to an inadequate volume of blood received in culture bottles   Culture   Final    NO GROWTH < 24 HOURS Performed at [facility], [address] [place], [place] [address]    Report Status PENDING  Incomplete  C Difficile Quick Screen w PCR reflex     Status: None   Collection Time: 09/19/19  7:01 AM   Specimen: STOOL  Result Value Ref Range Status   C Diff antigen NEGATIVE NEGATIVE Final   C Diff toxin NEGATIVE NEGATIVE Final   C Diff interpretation No C. difficile detected.  Final    Comment: Performed at [facility], [address] [place], [place] [address]    Radiology Reports DG Chest 1 View  Result Date: 08/30/2019 CLINICAL DATA:  Sepsis. Patient states infection in arm, hand, and endocarditis. Recent hospitalization for endocarditis. EXAM: CHEST  1 VIEW COMPARISON:  Most recent radiograph 06/16/2018, no interval chest imaging. FINDINGS: Multifocal patchy and nodular opacities throughout both lungs, some of which appear cavitary. Post median sternotomy. Prosthetic cardiac valve. Heart is normal in size. Normal mediastinal contours. No pneumothorax or large pleural effusion. Multiple surgical clips project over the left hemithorax. Surgical hardware in the lower cervical spine is partially included. IMPRESSION: Multifocal pulmonary opacities, some of which appear cavitary, most consistent with septic emboli in the setting of recent endocarditis. Electronically Signed   By: Keith Rake M.D.   On: 08/30/2019 19:23   DG Chest Port 1 View   Result Date: 09/18/2019 CLINICAL DATA:  Recent valve replacement EXAM: PORTABLE CHEST 1 VIEW COMPARISON:  08/30/2019 FINDINGS: Bilateral patchy interstitial and alveolar airspace opacities. No pleural effusion or pneumothorax. Stable cardiomediastinal silhouette. Interval valvuloplasty. Prior median sternotomy. Prior partially visualized anterior and posterior cervical fusion. IMPRESSION: Bilateral patchy interstitial and alveolar airspace opacities consistent with patient's known history of septic emboli. No significant interval change compared with 08/30/2019. Electronically Signed   By: Kathreen Devoid   On: 09/18/2019 11:36   ECHOCARDIOGRAM COMPLETE  Result Date: 08/31/2019    ECHOCARDIOGRAM REPORT   Patient Name:   Andrea King Date of Exam: 08/31/2019 Medical Rec #:  [id number]       Height:       62.0 in Accession #:    [id number]      Weight:       101.4 lb Date of Birth:  [birth year]      BSA:          1.432 m Patient Age:    42 years        BP:           112/64 mmHg Patient Gender: F               HR:           140 bpm. Exam Location:  [facility] Procedure: 2D Echo, Color Doppler and Cardiac Doppler Indications:     I38 Endocarditis  History:         Patient has prior history of Echocardiogram examinations.                  Substance abuse; hepatitis C.  Sonographer:     Charmayne Sheer RDCS (AE) Referring Phys:  [id number] Awilda Bill Diagnosing Phys: Neoma Laming MD  Sonographer Comments: Image acquisition challenging due to uncooperative patient. IMPRESSIONS  1. Left ventricular ejection fraction, by estimation, is 60 to 65%. The left ventricle has normal function.  The left ventricle has no regional wall motion abnormalities. Left ventricular diastolic parameters are consistent with Grade I diastolic dysfunction (impaired relaxation).  2. Right ventricular systolic function is normal. The right ventricular size is normal. There is normal pulmonary artery systolic pressure.  3. Left atrial size was mildly  dilated.  4. Right atrial size was mildly dilated.  5. The mitral valve is normal in structure. No evidence of mitral valve regurgitation. No evidence of mitral stenosis.  6. Vegetation on tip of leaflets of TV, 1.2x.5 cm. 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  7. The aortic valve is normal in structure. Aortic valve regurgitation is not visualized. No aortic stenosis is present.  8. The inferior vena cava is normal in size with greater than 50% respiratory variability, suggesting right atrial pressure of 3 mmHg. Comparison(s): Vegetation on tricuspid valve. FINDINGS  Left Ventricle: Left ventricular ejection fraction, by estimation, is 60 to 65%. The left ventricle has normal function. The left ventricle has no regional wall motion abnormalities. The left ventricular internal cavity size was normal in size. There is  no left ventricular hypertrophy. Left ventricular diastolic parameters are consistent with Grade I diastolic dysfunction (impaired relaxation). Right Ventricle: The right ventricular size is normal. No increase in right ventricular wall thickness. Right ventricular systolic function is normal. There is normal pulmonary artery systolic pressure. The tricuspid regurgitant velocity is 2.48 m/s, and  with an assumed right atrial pressure of 10 mmHg, the estimated right ventricular systolic pressure is 34.6 mmHg. Left Atrium: Left atrial size was mildly dilated. Right Atrium: Right atrial size was mildly dilated. Pericardium: There is no evidence of pericardial effusion. Mitral Valve: The mitral valve is normal in structure. Normal mobility of the mitral valve leaflets. No evidence of mitral valve regurgitation. No evidence of mitral valve stenosis. MV peak gradient, 5.0 mmHg. The mean mitral valve gradient is 3.0 mmHg. Tricuspid Valve: Vegetation on tip of leaflets of TV, 1.2x.5 cm. The tricuspid valve is myxomatous. Tricuspid valve regurgitation is moderate . No  evidence of tricuspid stenosis. Aortic Valve: The aortic valve is normal in structure. Aortic valve regurgitation is not visualized. No aortic stenosis is present. Aortic valve mean gradient measures 9.0 mmHg. Aortic valve peak gradient measures 16.0 mmHg. Aortic valve area, by VTI measures 2.54 cm. Pulmonic Valve: The pulmonic valve was normal in structure. Pulmonic valve regurgitation is not visualized. No evidence of pulmonic stenosis. Aorta: The aortic root is normal in size and structure. Venous: The inferior vena cava is normal in size with greater than 50% respiratory variability, suggesting right atrial pressure of 3 mmHg. IAS/Shunts: No atrial level shunt detected by color flow Doppler.  LEFT VENTRICLE PLAX 2D LVIDd:         4.33 cm  Diastology LVIDs:         2.83 cm  LV e' lateral:   19.60 cm/s LV PW:         0.93 cm  LV E/e' lateral: 3.5 LV IVS:        0.66 cm  LV e' medial:    10.10 cm/s LVOT diam:     1.90 cm  LV E/e' medial:  6.7 LV SV:         68 LV SV Index:   48 LVOT Area:     2.84 cm  RIGHT VENTRICLE RV Basal diam:  2.63 cm LEFT ATRIUM             Index  RIGHT ATRIUM           Index LA diam:        3.40 cm 2.37 cm/m  RA Area:     13.30 cm LA Vol (A2C):   27.5 ml 19.20 ml/m RA Volume:   31.00 ml  21.64 ml/m LA Vol (A4C):   31.2 ml 21.78 ml/m LA Biplane Vol: 30.5 ml 21.29 ml/m  AORTIC VALVE                    PULMONIC VALVE AV Area (Vmax):    2.54 cm     PV Vmax:       1.34 m/s AV Area (Vmean):   2.45 cm     PV Vmean:      87.100 cm/s AV Area (VTI):     2.54 cm     PV VTI:        0.192 m AV Vmax:           200.00 cm/s  PV Peak grad:  7.2 mmHg AV Vmean:          140.000 cm/s PV Mean grad:  4.0 mmHg AV VTI:            0.269 m AV Peak Grad:      16.0 mmHg AV Mean Grad:      9.0 mmHg LVOT Vmax:         179.00 cm/s LVOT Vmean:        121.000 cm/s LVOT VTI:          0.241 m LVOT/AV VTI ratio: 0.90  AORTA Ao Root diam: 2.70 cm MITRAL VALVE               TRICUSPID VALVE MV Area (PHT): 6.71  cm    TR Peak grad:   24.6 mmHg MV Peak grad:  5.0 mmHg    TR Vmax:        248.00 cm/s MV Mean grad:  3.0 mmHg MV Vmax:       1.12 m/s    SHUNTS MV Vmean:      82.0 cm/s   Systemic VTI:  0.24 m MV Decel Time: 113 msec    Systemic Diam: 1.90 cm MV E velocity: 67.90 cm/s MV A velocity: 97.00 cm/s MV E/A ratio:  0.70 Adrian Blackwater MD Electronically signed by Adrian Blackwater MD Signature Date/Time: 08/31/2019/10:06:33 AM    Final      Time Spent in minutes  30     Laverna Peace M.D on 09/19/2019 at 6:32 PM  To page go to [email or web address] - password [facility]

## 2019-09-19 NOTE — Plan of Care (Signed)

## 2019-09-19 NOTE — Progress Notes (Signed)
Reyes Ivan, NP notified of K+ this am.  No orders.

## 2019-09-19 NOTE — Progress Notes (Signed)
Upon entering pt room, a visitor found at bs.  Young lady says she is the patients sister, appeared disheveled.  Visitor asked if she checked in at the desk and she said no.  Visitor instructed that visiting hours are over and have been over since 8pm and that any time she comes to visit she must also sign in at the front desk and sign in.  She verbalized understand.  Visitor asked fr some ice and then left.

## 2019-09-19 NOTE — Progress Notes (Signed)
[facility]  ROUNDING NOTE   Subjective:   PRBC transfusion last night.   Patient is complaining of weakness and feels overwhelmed  Objective:  Vital signs in last 24 hours:  Temp:  [97.9 F (36.6 C)-98.9 F (37.2 C)] 98.1 F (36.7 C) (05/18 0833) Pulse Rate:  [84-109] 88 (05/18 0833) Resp:  [10-19] 17 (05/18 0833) BP: (101-153)/(61-83) 115/73 (05/18 0833) SpO2:  [97 %-100 %] 100 % (05/18 0833) Weight:  [51 kg-51.3 kg] 51.3 kg (05/18 0541)  Weight change:  Filed Weights   09/18/19 1026 09/18/19 1953 09/19/19 0541  Weight: 52 kg 51 kg 51.3 kg    Intake/Output: I/O last 3 completed shifts: In: 2426 [P.O.:1306; I.V.:810; Blood:310] Out: 1800 [Urine:1800]   Intake/Output this shift:  Total I/O In: 276 [I.V.:276] Out: 0   Physical Exam: General: NAD,   Head: Normocephalic, atraumatic. Moist oral mucosal membranes  Eyes: Anicteric, PERRL  Neck: Supple, trachea midline  Lungs:  Clear to auscultation  Heart: Regular rate and rhythm  Abdomen:  Soft, nontender,   Extremities:  + peripheral edema.  Neurologic: Nonfocal, moving all four extremities  Skin: No lesions        Basic Metabolic Panel: Recent Labs  Lab 09/18/19 1058 09/19/19 0004 09/19/19 0021  NA 136  --  137  K 3.0*  --  3.0*  CL 111  --  115*  CO2 13*  --  13*  GLUCOSE 84  --  104*  BUN 19  --  21*  CREATININE 2.00*  --  1.82*  CALCIUM 8.2*  --  7.8*  MG  --  2.0  --     Liver Function Tests: Recent Labs  Lab 09/18/19 1058  AST 10*  ALT 8  ALKPHOS 93  BILITOT 0.9  PROT 7.5  ALBUMIN 1.9*   No results for input(s): LIPASE, AMYLASE in the last 168 hours. No results for input(s): AMMONIA in the last 168 hours.  CBC: Recent Labs  Lab 09/18/19 1058 09/19/19 0021  WBC 13.1* 10.5  NEUTROABS 8.5*  --   HGB 6.5* 7.0*  HCT 20.4* 20.1*  MCV 85.7 82.0  PLT 713* 491*    Cardiac Enzymes: No results for input(s): CKTOTAL, CKMB, CKMBINDEX, TROPONINI in the last 168  hours.  BNP: Invalid input(s): POCBNP  CBG: No results for input(s): GLUCAP in the last 168 hours.  Microbiology: Results for orders placed or performed during the hospital encounter of 09/18/19  Culture, blood (routine x 2)     Status: None (Preliminary result)   Collection Time: 09/18/19 10:58 AM   Specimen: BLOOD LEFT ARM  Result Value Ref Range Status   Specimen Description BLOOD LEFT ARM  Final   Special Requests   Final    BOTTLES DRAWN AEROBIC AND ANAEROBIC Blood Culture adequate volume   Culture   Final    NO GROWTH < 24 HOURS Performed at [facility], [address], [place], [place] [address]    Report Status PENDING  Incomplete  Culture, blood (routine x 2)     Status: None (Preliminary result)   Collection Time: 09/18/19 12:20 PM   Specimen: BLOOD  Result Value Ref Range Status   Specimen Description BLOOD RIGHT AC  Final   Special Requests   Final    BOTTLES DRAWN AEROBIC ONLY Blood Culture results may not be optimal due to an inadequate volume of blood received in culture bottles   Culture   Final    NO GROWTH <  76 HOURS Performed at [facility], [address] [place], [place] [address]    Report Status PENDING  Incomplete  C Difficile Quick Screen w PCR reflex     Status: None   Collection Time: 09/19/19  7:01 AM   Specimen: STOOL  Result Value Ref Range Status   C Diff antigen NEGATIVE NEGATIVE Final   C Diff toxin NEGATIVE NEGATIVE Final   C Diff interpretation No C. difficile detected.  Final    Comment: Performed at [facility], [address] [place], [place] [address]    Coagulation Studies: Recent Labs    09/18/19 1058  LABPROT 22.2*  INR 2.0*    Urinalysis: Recent Labs    09/18/19 2240  COLORURINE STRAW*  LABSPEC 1.008  PHURINE 5.0  GLUCOSEU NEGATIVE  HGBUR LARGE*  BILIRUBINUR NEGATIVE  KETONESUR NEGATIVE  PROTEINUR 30*  NITRITE NEGATIVE  LEUKOCYTESUR TRACE*      Imaging: DG Chest Port 1  View  Result Date: 09/18/2019 CLINICAL DATA:  Recent valve replacement EXAM: PORTABLE CHEST 1 VIEW COMPARISON:  08/30/2019 FINDINGS: Bilateral patchy interstitial and alveolar airspace opacities. No pleural effusion or pneumothorax. Stable cardiomediastinal silhouette. Interval valvuloplasty. Prior median sternotomy. Prior partially visualized anterior and posterior cervical fusion. IMPRESSION: Bilateral patchy interstitial and alveolar airspace opacities consistent with patient's known history of septic emboli. No significant interval change compared with 08/30/2019. Electronically Signed   By: Kathreen Devoid   On: 09/18/2019 11:36     Medications:   . nafcillin (NAFCIL) continuous infusion    . sterile water 850 mL with potassium chloride 40 mEq, sodium bicarbonate 150 mEq infusion 75 mL/hr at 09/19/19 1009   . sodium chloride   Intravenous Once  . ALPRAZolam  1 mg Oral TID  . Chlorhexidine Gluconate Cloth  6 each Topical Daily  . feeding supplement (ENSURE ENLIVE)  237 mL Oral TID BM  . nicotine  21 mg Transdermal Daily  . rifampin  300 mg Oral Q12H   acetaminophen, docusate sodium, HYDROmorphone (DILAUDID) injection, ipratropium-albuterol, ondansetron **OR** ondansetron (ZOFRAN) IV, polyethylene glycol, simethicone  Assessment/ Plan:  [name] is a 44 y.o. white female [name] is a 44 y.o. 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 Acute renal failure with metabolic acidosis, hypokalemia, proteinuria and hematuria Baseline creatinine of 0.8. Acute renal failure most likely secondary to endocarditis Holding gentamicin due to renal failure.  - Change IV fluids to bicarb with potassium chloride infusion at 45mL/hr  2. Endocarditis: status post tricuspid valve replacement last week at [facility].  - nafcillin, rifampin.   -  Appreciate ID input.   3. Anemia with renal failure: status post PRBC transfusion. LDH elevated.    LOS: 1 Andrea King 5/18/202111:34 AM

## 2019-09-20 LAB — CBC
HCT: 21.4 % — ABNORMAL LOW (ref 36.0–46.0)
Hemoglobin: 7.4 g/dL — ABNORMAL LOW (ref 12.0–15.0)
MCH: 28.1 pg (ref 26.0–34.0)
MCHC: 34.6 g/dL (ref 30.0–36.0)
MCV: 81.4 fL (ref 80.0–100.0)
Platelets: 656 10*3/uL — ABNORMAL HIGH (ref 150–400)
RBC: 2.63 MIL/uL — ABNORMAL LOW (ref 3.87–5.11)
RDW: 21.1 % — ABNORMAL HIGH (ref 11.5–15.5)
WBC: 12.7 10*3/uL — ABNORMAL HIGH (ref 4.0–10.5)
nRBC: 0 % (ref 0.0–0.2)

## 2019-09-20 LAB — BASIC METABOLIC PANEL
Anion gap: 9 (ref 5–15)
BUN: 20 mg/dL (ref 6–20)
CO2: 19 mmol/L — ABNORMAL LOW (ref 22–32)
Calcium: 7.7 mg/dL — ABNORMAL LOW (ref 8.9–10.3)
Chloride: 108 mmol/L (ref 98–111)
Creatinine, Ser: 1.5 mg/dL — ABNORMAL HIGH (ref 0.44–1.00)
GFR calc Af Amer: 48 mL/min — ABNORMAL LOW (ref >60)
GFR calc non Af Amer: 42 mL/min — ABNORMAL LOW (ref >60)
Glucose, Bld: 94 mg/dL (ref 70–99)
Potassium: 3 mmol/L — ABNORMAL LOW (ref 3.5–5.1)
Sodium: 136 mmol/L (ref 135–145)

## 2019-09-20 LAB — C3 COMPLEMENT: C3 Complement: 134 mg/dL (ref 82–167)

## 2019-09-20 LAB — HAPTOGLOBIN: Haptoglobin: 87 mg/dL (ref 42–296)

## 2019-09-20 LAB — C4 COMPLEMENT: Complement C4, Body Fluid: 22 mg/dL (ref 12–38)

## 2019-09-20 MED ORDER — POTASSIUM CHLORIDE CRYS ER 20 MEQ PO TBCR
20.0000 meq | EXTENDED_RELEASE_TABLET | Freq: Once | ORAL | Status: AC
  Administered 2019-09-20: 20 meq via ORAL
  Filled 2019-09-20: qty 1

## 2019-09-20 MED ORDER — RIFAMPIN 300 MG PO CAPS
300.0000 mg | ORAL_CAPSULE | Freq: Three times a day (TID) | ORAL | Status: DC
  Administered 2019-09-20 – 2019-09-26 (×17): 300 mg via ORAL
  Filled 2019-09-20 (×20): qty 1

## 2019-09-20 MED ORDER — SODIUM BICARBONATE 650 MG PO TABS
650.0000 mg | ORAL_TABLET | Freq: Every day | ORAL | Status: DC
  Administered 2019-09-20 – 2019-09-26 (×7): 650 mg via ORAL
  Filled 2019-09-20 (×7): qty 1

## 2019-09-20 MED ORDER — POTASSIUM CHLORIDE CRYS ER 20 MEQ PO TBCR: 20.0000 meq | EXTENDED_RELEASE_TABLET | Freq: Once | ORAL | Status: DC

## 2019-09-20 NOTE — Progress Notes (Signed)
Pt refuses IVF. Dr Ronn Melena and Marisa Cyphers NP notified.

## 2019-09-20 NOTE — Progress Notes (Signed)
ID Says she is feeling okay Abdominal pain okay No fever Low energy   O/e Patient Vitals for the past 24 hrs:  BP Temp Temp src Pulse Resp SpO2 Weight  09/20/19 1638 119/62 98.2 F (36.8 C) Oral 93 18 100 % --  09/20/19 1206 105/68 98 F (36.7 C) Oral 99 18 100 % --  09/20/19 0746 109/65 98.2 F (36.8 C) Oral 96 18 98 % --  09/20/19 0512 108/63 97.7 F (36.5 C) Oral 99 19 98 % 51.7 kg  09/19/19 1932 (!) 110/59 98.1 F (36.7 C) Oral 96 19 100 % --   Chest b/l air entry- crepts present HSs 1s2 abd soft Sternal surgical site healing well Cns non focal   LAbs  CBC Latest Ref Rng & Units 09/20/2019 09/19/2019 09/18/2019  WBC 4.0 - 10.5 K/uL 12.7(H) 10.5 13.1(H)  Hemoglobin 12.0 - 15.0 g/dL 7.4(L) 7.0(L) 6.5(L)  Hematocrit 36.0 - 46.0 % 21.4(L) 20.1(L) 20.4(L)  Platelets 150 - 400 K/uL 656(H) 491(H) 713(H)    CMP Latest Ref Rng & Units 09/20/2019 09/19/2019 09/18/2019  Glucose 70 - 99 mg/dL 94 132(G) 84  BUN 6 - 20 mg/dL 20 40(N) 19  Creatinine 0.44 - 1.00 mg/dL 0.27(O) 5.36(U) 4.40(H)  Sodium 135 - 145 mmol/L 136 137 136  Potassium 3.5 - 5.1 mmol/L 3.0(L) 3.0(L) 3.0(L)  Chloride 98 - 111 mmol/L 108 115(H) 111  CO2 22 - 32 mmol/L 19(L) 13(L) 13(L)  Calcium 8.9 - 10.3 mg/dL 7.7(L) 7.8(L) 8.2(L)  Total Protein 6.5 - 8.1 g/dL - - 7.5  Total Bilirubin 0.3 - 1.2 mg/dL - - 0.9  Alkaline Phos 38 - 126 U/L - - 93  AST 15 - 41 U/L - - 10(L)  ALT 0 - 44 U/L - - 8     Impression/Recommendation ? MSSA bacteremia with tricuspid valve endocarditis and septic emboli since 08/06/19 with stay at [facility]/ [facility]-then back to [facility] - AMA twice- non compliant Last transfer to [facility] on 4/30 and underwent redo tricuspid valve replacement and PFO repair Valve culture from 09/08/19 is MSSA Pt is on nafcillin + rifampin Left AMA on 5/15 from [facility] and is here now [facility] 5/17 NG  Will Need 6 weeks of IV antibiotic -- 10/19/19 Cannot be discharged home with PICC 5  Anemia-s/p  PRBC  AKI_improving  L5-S1 discitis/OM presacral abscess as noted at [facility] 4/421  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 Suboxone? So that she will stay and complete her antibiotics   Cervical and lumbar spine osteomyelitis In October/November/December 2019 with Serratia bacteremia, MRSA, Enterococcus C4-T2 fusion  HCV RNA negative, reactive ab  Discussed with patient. ID will follow her peripherally- call if needed

## 2019-09-20 NOTE — Progress Notes (Signed)
PROGRESS NOTE    Andrea King  EXN:[id number] DOB: Aug 06, 1975 DOA: 09/18/2019 PCP: Alberteen Spindle, CNM    Assessment & Plan:   Principal Problem:   Endocarditis of tricuspid valve Active Problems:   Opiate abuse, continuous (HCC)   AKI (acute kidney injury) (HCC)   Hypokalemia   Anemia in other chronic diseases classified elsewhere   Chronic hepatitis C without hepatic coma (HCC)   Metabolic acidosis   Septic pulmonary embolism (HCC)   MSSA bacteremia & tricuspid valve endocarditis: w/ septic pulmonary emboli in the setting of IV drug use in patient who left AMA after recent tricuspid valve redo. Has left AMA twice (from [facility] on 5/15 as well as [facility] on 4/30). Continue nafcillin, rifampin as recommended by ID, needs 6 total weeks  Normocytic anemia: s/p 1 unit of PRBCs this admission. Haptoglobin is WNL & LDH is elevated. H&H are trending up slightly today. Will continue to monitor   AKI: Cr is trending down today. 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 No current back pain   Polysubstance abuse: w/ opioids, tobacco and benzodiazepines. Dilaudid prn. Continue on xanax, methadone. Nicotine patch to prevent w/drawal. 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 Postop TTE on 5/10 at outside hospital showed preserved EF with mild transvalvular regurgitation of prosthetic valve. CT surgery recommended aspirin daily   Nausea, abdominal discomfort:  Symptoms seem consistent with gas.  We will try supportive care with simethicone as needed, if persists may warrant further imaging.  Abdominal imaging at outside hospital showed hepatic congestion and some gallbladder thickening. LFTs here unremarkable.  Bilirubin unremarkable. Negative Murphy sign on exam. Simethicone, zofran prn   Chronic hepatitis C: without mental status changes.  HCV antibody  positive and HCVRNA negative  Metabolic acidosis: will start po bicarb   Hypokalemia: KCl repleted. Will continue to monitor   Thrombocytosis: etiology unclear, possibly reactive. Will continue to monitor  Leukocytosis: likely secondary to MSSA bacteremia. Continue on IV abxs   DVT prophylaxis: Code Status: full  Family Communication:  Disposition Plan: possibly d/c to SNF as pt needs 6 weeks of IV abxs as per ID. Home health will not do IV abxs for pts w/ hx of IVDA   Consultants:   nephro  ID   Procedures:    Antimicrobials: nafcillin, rifampin    Subjective: Pt c/o severe malaise. Pt denies sleeping well last night.   Objective: Vitals:   09/19/19 1505 09/19/19 1932 09/20/19 0512 09/20/19 0746  BP: 115/64 (!) 110/59 108/63 109/65  Pulse: 92 96 99 96  Resp: 17 19 19 18   Temp: 97.8 F (36.6 C) 98.1 F (36.7 C) 97.7 F (36.5 C) 98.2 F (36.8 C)  TempSrc:  Oral Oral Oral  SpO2: 100% 100% 98% 98%  Weight:   51.7 kg   Height:        Intake/Output Summary (Last 24 hours) at 09/20/2019 0824 Last data filed at 09/20/2019 0518 Gross per 24 hour  Intake 2518.96 ml  Output 2975 ml  Net -456.04 ml   Filed Weights   09/18/19 1953 09/19/19 0541 09/20/19 0512  Weight: 51 kg 51.3 kg 51.7 kg    Examination:  General exam: Appears calm and comfortable but lethargic  Respiratory system: diminished breath sounds b/l. No wheezes  Cardiovascular system: S1 & S2 +. No rubs, gallops or clicks.  Gastrointestinal system: Abdomen is nondistended,  soft and nontender. Hypoactive bowel sounds heard. Central nervous system: Alert and oriented. Moves all 4 extremities  Psychiatry: Judgement and insight appear abnormal. Flat mood and affect     Data Reviewed: I have personally reviewed following labs and imaging studies  CBC: Recent Labs  Lab 09/18/19 1058 09/19/19 0021 09/20/19 0637  WBC 13.1* 10.5 12.7*  NEUTROABS 8.5*  --   --   HGB 6.5* 7.0* 7.4*  HCT 20.4* 20.1*  21.4*  MCV 85.7 82.0 81.4  PLT 713* 491* 656*   Basic Metabolic Panel: Recent Labs  Lab 09/18/19 1058 09/19/19 0004 09/19/19 0021  NA 136  --  137  K 3.0*  --  3.0*  CL 111  --  115*  CO2 13*  --  13*  GLUCOSE 84  --  104*  BUN 19  --  21*  CREATININE 2.00*  --  1.82*  CALCIUM 8.2*  --  7.8*  MG  --  2.0  --    GFR: Estimated Creatinine Clearance: 30.9 mL/min (A) (by C-G formula based on SCr of 1.82 mg/dL (H)). Liver Function Tests: Recent Labs  Lab 09/18/19 1058  AST 10*  ALT 8  ALKPHOS 93  BILITOT 0.9  PROT 7.5  ALBUMIN 1.9*   No results for input(s): LIPASE, AMYLASE in the last 168 hours. No results for input(s): AMMONIA in the last 168 hours. Coagulation Profile: Recent Labs  Lab 09/18/19 1058  INR 2.0*   Cardiac Enzymes: No results for input(s): CKTOTAL, CKMB, CKMBINDEX, TROPONINI in the last 168 hours. BNP (last 3 results) No results for input(s): PROBNP in the last 8760 hours. HbA1C: No results for input(s): HGBA1C in the last 72 hours. CBG: No results for input(s): GLUCAP in the last 168 hours. Lipid Profile: No results for input(s): CHOL, HDL, LDLCALC, TRIG, CHOLHDL, LDLDIRECT in the last 72 hours. Thyroid Function Tests: No results for input(s): TSH, T4TOTAL, FREET4, T3FREE, THYROIDAB in the last 72 hours. Anemia Panel: No results for input(s): VITAMINB12, FOLATE, FERRITIN, TIBC, IRON, RETICCTPCT in the last 72 hours. Sepsis Labs: Recent Labs  Lab 09/18/19 1058  PROCALCITON 0.13    Recent Results (from the past 240 hour(s))  Culture, blood (routine x 2)     Status: None (Preliminary result)   Collection Time: 09/18/19 10:58 AM   Specimen: BLOOD LEFT ARM  Result Value Ref Range Status   Specimen Description BLOOD LEFT ARM  Final   Special Requests   Final    BOTTLES DRAWN AEROBIC AND ANAEROBIC Blood Culture adequate volume   Culture   Final    NO GROWTH 2 DAYS Performed at [facility], [address], [place], [place]  [address]    Report Status PENDING  Incomplete  Culture, blood (routine x 2)     Status: None (Preliminary result)   Collection Time: 09/18/19 12:20 PM   Specimen: BLOOD  Result Value Ref Range Status   Specimen Description BLOOD RIGHT AC  Final   Special Requests   Final    BOTTLES DRAWN AEROBIC ONLY Blood Culture results may not be optimal due to an inadequate volume of blood received in culture bottles   Culture   Final    NO GROWTH 2 DAYS Performed at [facility], [address], [place], [place] [address]    Report Status PENDING  Incomplete  C Difficile Quick Screen w PCR reflex     Status: None   Collection Time: 09/19/19  7:01 AM   Specimen:  STOOL  Result Value Ref Range Status   C Diff antigen NEGATIVE NEGATIVE Final   C Diff toxin NEGATIVE NEGATIVE Final   C Diff interpretation No C. difficile detected.  Final    Comment: Performed at [facility], [address], [place], [place] [address]         Radiology Studies: DG Chest [place] 1 View  Result Date: 09/18/2019 CLINICAL DATA:  Recent valve replacement EXAM: PORTABLE CHEST 1 VIEW COMPARISON:  08/30/2019 FINDINGS: Bilateral patchy interstitial and alveolar airspace opacities. No pleural effusion or pneumothorax. Stable cardiomediastinal silhouette. Interval valvuloplasty. Prior median sternotomy. Prior partially visualized anterior and posterior cervical fusion. IMPRESSION: Bilateral patchy interstitial and alveolar airspace opacities consistent with patient's known history of septic emboli. No significant interval change compared with 08/30/2019. Electronically Signed   By: Kathreen Devoid   On: 09/18/2019 11:36        Scheduled Meds: . sodium chloride   Intravenous Once  . ALPRAZolam  1 mg Oral TID  . aspirin EC  81 mg Oral Daily  . Chlorhexidine Gluconate Cloth  6 each Topical Daily  . feeding supplement (ENSURE ENLIVE)  237 mL Oral TID BM  . nicotine  21 mg Transdermal Daily  . rifampin  300 mg  Oral Q12H   Continuous Infusions: . nafcillin (NAFCIL) continuous infusion 12 g (09/19/19 1723)  . sterile water 850 mL with potassium chloride 40 mEq, sodium bicarbonate 150 mEq infusion 75 mL/hr at 09/19/19 2238     LOS: 2 days    Time spent: 31 mins     Wyvonnia Dusky, MD [facility] Pager [phone]  If 7PM-7AM, please contact night-coverage [email or web address] 09/20/2019, 8:24 AM

## 2019-09-20 NOTE — Progress Notes (Signed)
[facility]  ROUNDING NOTE   Subjective:   Currently states she wants to be left alone. Refused labs this morning but now agreed. Labs are back and renal function has improved  Objective:  Vital signs in last 24 hours:  Temp:  [97.7 F (36.5 C)-98.2 F (36.8 C)] 98.2 F (36.8 C) (05/19 0746) Pulse Rate:  [92-99] 96 (05/19 0746) Resp:  [17-19] 18 (05/19 0746) BP: (108-140)/(59-84) 109/65 (05/19 0746) SpO2:  [98 %-100 %] 98 % (05/19 0746) Weight:  [51.7 kg] 51.7 kg (05/19 0512)  Weight change: -0.335 kg Filed Weights   09/18/19 1953 09/19/19 0541 09/20/19 0512  Weight: 51 kg 51.3 kg 51.7 kg    Intake/Output: I/O last 3 completed shifts: In: 4945 [P.O.:2017; I.V.:2410.7; Blood:310; IV Piggyback:207.3] Out: 4775 [Urine:4775]   Intake/Output this shift:  No intake/output data recorded.  Physical Exam: General: NAD,   Head: Normocephalic, atraumatic. Moist oral mucosal membranes  Eyes: Anicteric, PERRL  Neck: Supple, trachea midline  Lungs:  Clear to auscultation  Heart: Regular rate and rhythm  Abdomen:  Soft, nontender,   Extremities:  + peripheral edema.  Neurologic: Nonfocal, moving all four extremities  Skin: No lesions        Basic Metabolic Panel: Recent Labs  Lab 09/18/19 1058 09/19/19 0004 09/19/19 0021 09/20/19 0903  NA 136  --  137 136  K 3.0*  --  3.0* 3.0*  CL 111  --  115* 108  CO2 13*  --  13* 19*  GLUCOSE 84  --  104* 94  BUN 19  --  21* 20  CREATININE 2.00*  --  1.82* 1.50*  CALCIUM 8.2*  --  7.8* 7.7*  MG  --  2.0  --   --     Liver Function Tests: Recent Labs  Lab 09/18/19 1058  AST 10*  ALT 8  ALKPHOS 93  BILITOT 0.9  PROT 7.5  ALBUMIN 1.9*   No results for input(s): LIPASE, AMYLASE in the last 168 hours. No results for input(s): AMMONIA in the last 168 hours.  CBC: Recent Labs  Lab 09/18/19 1058 09/19/19 0021 09/20/19 0637  WBC 13.1* 10.5 12.7*  NEUTROABS 8.5*  --   --   HGB 6.5* 7.0* 7.4*  HCT 20.4*  20.1* 21.4*  MCV 85.7 82.0 81.4  PLT 713* 491* 656*    Cardiac Enzymes: No results for input(s): CKTOTAL, CKMB, CKMBINDEX, TROPONINI in the last 168 hours.  BNP: Invalid input(s): POCBNP  CBG: No results for input(s): GLUCAP in the last 168 hours.  Microbiology: Results for orders placed or performed during the hospital encounter of 09/18/19  Culture, blood (routine x 2)     Status: None (Preliminary result)   Collection Time: 09/18/19 10:58 AM   Specimen: BLOOD LEFT ARM  Result Value Ref Range Status   Specimen Description BLOOD LEFT ARM  Final   Special Requests   Final    BOTTLES DRAWN AEROBIC AND ANAEROBIC Blood Culture adequate volume   Culture   Final    NO GROWTH 2 DAYS Performed at [facility], [address], [place], [place] [address]    Report Status PENDING  Incomplete  Culture, blood (routine x 2)     Status: None (Preliminary result)   Collection Time: 09/18/19 12:20 PM   Specimen: BLOOD  Result Value Ref Range Status   Specimen Description BLOOD RIGHT [facility]  Final   Special Requests   Final    BOTTLES DRAWN AEROBIC ONLY Blood  Culture results may not be optimal due to an inadequate volume of blood received in culture bottles   Culture   Final    NO GROWTH 2 DAYS Performed at [facility], [address] [place], [place] [address]    Report Status PENDING  Incomplete  C Difficile Quick Screen w PCR reflex     Status: None   Collection Time: 09/19/19  7:01 AM   Specimen: STOOL  Result Value Ref Range Status   C Diff antigen NEGATIVE NEGATIVE Final   C Diff toxin NEGATIVE NEGATIVE Final   C Diff interpretation No C. difficile detected.  Final    Comment: Performed at [facility], [address] [place], [place] [address]    Coagulation Studies: Recent Labs    09/18/19 1058  LABPROT 22.2*  INR 2.0*    Urinalysis: Recent Labs    09/18/19 2240  COLORURINE STRAW*  LABSPEC 1.008  PHURINE 5.0  GLUCOSEU NEGATIVE   HGBUR LARGE*  BILIRUBINUR NEGATIVE  KETONESUR NEGATIVE  PROTEINUR 30*  NITRITE NEGATIVE  LEUKOCYTESUR TRACE*      Imaging: DG Chest Port 1 View  Result Date: 09/18/2019 CLINICAL DATA:  Recent valve replacement EXAM: PORTABLE CHEST 1 VIEW COMPARISON:  08/30/2019 FINDINGS: Bilateral patchy interstitial and alveolar airspace opacities. No pleural effusion or pneumothorax. Stable cardiomediastinal silhouette. Interval valvuloplasty. Prior median sternotomy. Prior partially visualized anterior and posterior cervical fusion. IMPRESSION: Bilateral patchy interstitial and alveolar airspace opacities consistent with patient's known history of septic emboli. No significant interval change compared with 08/30/2019. Electronically Signed   By: Kathreen Devoid   On: 09/18/2019 11:36     Medications:   . nafcillin (NAFCIL) continuous infusion 12 g (09/19/19 1723)  . sterile water 850 mL with potassium chloride 40 mEq, sodium bicarbonate 150 mEq infusion 75 mL/hr at 09/19/19 2238   . sodium chloride   Intravenous Once  . ALPRAZolam  1 mg Oral TID  . aspirin EC  81 mg Oral Daily  . Chlorhexidine Gluconate Cloth  6 each Topical Daily  . feeding supplement (ENSURE ENLIVE)  237 mL Oral TID BM  . nicotine  21 mg Transdermal Daily  . rifampin  300 mg Oral Q12H   acetaminophen, docusate sodium, HYDROmorphone (DILAUDID) injection, ipratropium-albuterol, ondansetron **OR** ondansetron (ZOFRAN) IV, polyethylene glycol, simethicone  Assessment/ Plan:  Ms. KAYMARIE WYNN is a 44 y.o. white female Ms. FANTASIA JINKINS is a 44 y.o. 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 Acute renal failure with metabolic acidosis, hypokalemia, proteinuria and hematuria Baseline creatinine of 0.8. Acute renal failure most likely secondary to endocarditis Holding gentamicin due to  renal failure.  Potassium and anion gap have improved.  - Continue IV fluids to bicarb with potassium chloride infusion at 54mL/hr  2. Endocarditis: status post tricuspid valve replacement last week at [facility]. MSSA - nafcillin, rifampin.   - Appreciate ID input.   3. Anemia with renal failure: status post PRBC transfusion on 5/17    LOS: 2 Laterria [place]ota 5/19/20219:40 AM

## 2019-09-21 DIAGNOSIS — B9561 Methicillin susceptible Staphylococcus aureus infection as the cause of diseases classified elsewhere: Secondary | ICD-10-CM

## 2019-09-21 LAB — BASIC METABOLIC PANEL
Anion gap: 9 (ref 5–15)
BUN: 16 mg/dL (ref 6–20)
CO2: 21 mmol/L — ABNORMAL LOW (ref 22–32)
Calcium: 7.5 mg/dL — ABNORMAL LOW (ref 8.9–10.3)
Chloride: 107 mmol/L (ref 98–111)
Creatinine, Ser: 1.25 mg/dL — ABNORMAL HIGH (ref 0.44–1.00)
GFR calc Af Amer: >60 mL/min (ref >60)
GFR calc non Af Amer: 52 mL/min — ABNORMAL LOW (ref >60)
Glucose, Bld: 101 mg/dL — ABNORMAL HIGH (ref 70–99)
Potassium: 3.4 mmol/L — ABNORMAL LOW (ref 3.5–5.1)
Sodium: 137 mmol/L (ref 135–145)

## 2019-09-21 LAB — CBC
HCT: 20.5 % — ABNORMAL LOW (ref 36.0–46.0)
Hemoglobin: 7.3 g/dL — ABNORMAL LOW (ref 12.0–15.0)
MCH: 28.5 pg (ref 26.0–34.0)
MCHC: 35.6 g/dL (ref 30.0–36.0)
MCV: 80.1 fL (ref 80.0–100.0)
Platelets: 579 10*3/uL — ABNORMAL HIGH (ref 150–400)
RBC: 2.56 MIL/uL — ABNORMAL LOW (ref 3.87–5.11)
RDW: 20.8 % — ABNORMAL HIGH (ref 11.5–15.5)
WBC: 12.8 10*3/uL — ABNORMAL HIGH (ref 4.0–10.5)
nRBC: 0 % (ref 0.0–0.2)

## 2019-09-21 LAB — MAGNESIUM: Magnesium: 1.6 mg/dL — ABNORMAL LOW (ref 1.7–2.4)

## 2019-09-21 MED ORDER — ENOXAPARIN SODIUM 40 MG/0.4ML ~~LOC~~ SOLN
40.0000 mg | SUBCUTANEOUS | Status: DC
  Administered 2019-09-21: 40 mg via SUBCUTANEOUS
  Filled 2019-09-21: qty 0.4

## 2019-09-21 MED ORDER — POTASSIUM CHLORIDE 10 MEQ/100ML IV SOLN
10.0000 meq | INTRAVENOUS | Status: DC
  Filled 2019-09-21 (×4): qty 100

## 2019-09-21 MED ORDER — MELATONIN 3 MG PO TABS
3.0000 mg | ORAL_TABLET | Freq: Every day | ORAL | Status: DC
  Filled 2019-09-21: qty 1

## 2019-09-21 MED ORDER — MELATONIN 5 MG PO TABS
2.5000 mg | ORAL_TABLET | Freq: Every day | ORAL | Status: DC
  Administered 2019-09-21 – 2019-09-26 (×6): 2.5 mg via ORAL
  Filled 2019-09-21 (×7): qty 1

## 2019-09-21 MED ORDER — POTASSIUM CHLORIDE CRYS ER 20 MEQ PO TBCR
40.0000 meq | EXTENDED_RELEASE_TABLET | Freq: Once | ORAL | Status: AC
  Administered 2019-09-21: 40 meq via ORAL
  Filled 2019-09-21: qty 4

## 2019-09-21 MED ORDER — LOPERAMIDE HCL 2 MG PO CAPS
4.0000 mg | ORAL_CAPSULE | Freq: Once | ORAL | Status: AC
  Administered 2019-09-21: 4 mg via ORAL
  Filled 2019-09-21: qty 2

## 2019-09-21 MED ORDER — MAGNESIUM SULFATE 2 GM/50ML IV SOLN
2.0000 g | Freq: Once | INTRAVENOUS | Status: AC
  Administered 2019-09-21: 2 g via INTRAVENOUS
  Filled 2019-09-21: qty 50

## 2019-09-21 NOTE — Progress Notes (Signed)
[facility]  ROUNDING NOTE   Subjective:   Refusing IV fluids.   Objective:  Vital signs in last 24 hours:  Temp:  [97.9 F (36.6 C)-98.2 F (36.8 C)] 97.9 F (36.6 C) (05/20 0828) Pulse Rate:  [90-99] 90 (05/20 0828) Resp:  [18-20] 18 (05/20 0828) BP: (105-121)/(62-73) 120/73 (05/20 0828) SpO2:  [98 %-100 %] 99 % (05/20 0828)  Weight change:  Filed Weights   09/18/19 1953 09/19/19 0541 09/20/19 0512  Weight: 51 kg 51.3 kg 51.7 kg    Intake/Output: I/O last 3 completed shifts: In: 7371 [P.O.:2631; I.V.:1468.3; IV Piggyback:385.7] Out: 3875 [Urine:3875]   Intake/Output this shift:  No intake/output data recorded.  Physical Exam: General: NAD,   Head: Normocephalic, atraumatic. Moist oral mucosal membranes  Eyes: Anicteric, PERRL  Neck: Supple, trachea midline  Lungs:  Clear to auscultation  Heart: Regular rate and rhythm  Abdomen:  Soft, nontender,   Extremities:  + peripheral edema.  Neurologic: Nonfocal, moving all four extremities  Skin: No lesions        Basic Metabolic Panel: Recent Labs  Lab 09/18/19 1058 09/19/19 0004 09/19/19 0021 09/20/19 0903  NA 136  --  137 136  K 3.0*  --  3.0* 3.0*  CL 111  --  115* 108  CO2 13*  --  13* 19*  GLUCOSE 84  --  104* 94  BUN 19  --  21* 20  CREATININE 2.00*  --  1.82* 1.50*  CALCIUM 8.2*  --  7.8* 7.7*  MG  --  2.0  --   --     Liver Function Tests: Recent Labs  Lab 09/18/19 1058  AST 10*  ALT 8  ALKPHOS 93  BILITOT 0.9  PROT 7.5  ALBUMIN 1.9*   No results for input(s): LIPASE, AMYLASE in the last 168 hours. No results for input(s): AMMONIA in the last 168 hours.  CBC: Recent Labs  Lab 09/18/19 1058 09/19/19 0021 09/20/19 0637  WBC 13.1* 10.5 12.7*  NEUTROABS 8.5*  --   --   HGB 6.5* 7.0* 7.4*  HCT 20.4* 20.1* 21.4*  MCV 85.7 82.0 81.4  PLT 713* 491* 656*    Cardiac Enzymes: No results for input(s): CKTOTAL, CKMB, CKMBINDEX, TROPONINI in the last 168  hours.  BNP: Invalid input(s): POCBNP  CBG: No results for input(s): GLUCAP in the last 168 hours.  Microbiology: Results for orders placed or performed during the hospital encounter of 09/18/19  Culture, blood (routine x 2)     Status: None (Preliminary result)   Collection Time: 09/18/19 10:58 AM   Specimen: BLOOD LEFT ARM  Result Value Ref Range Status   Specimen Description BLOOD LEFT ARM  Final   Special Requests   Final    BOTTLES DRAWN AEROBIC AND ANAEROBIC Blood Culture adequate volume   Culture   Final    NO GROWTH 3 DAYS Performed at [facility], [address] [place], [place] [address]    Report Status PENDING  Incomplete  Culture, blood (routine x 2)     Status: None (Preliminary result)   Collection Time: 09/18/19 12:20 PM   Specimen: BLOOD  Result Value Ref Range Status   Specimen Description BLOOD RIGHT AC  Final   Special Requests   Final    BOTTLES DRAWN AEROBIC ONLY Blood Culture results may not be optimal due to an inadequate volume of blood received in culture bottles   Culture   Final    NO GROWTH 3  DAYS Performed at [facility], [address] [place], [place] [address]    Report Status PENDING  Incomplete  C Difficile Quick Screen w PCR reflex     Status: None   Collection Time: 09/19/19  7:01 AM   Specimen: STOOL  Result Value Ref Range Status   C Diff antigen NEGATIVE NEGATIVE Final   C Diff toxin NEGATIVE NEGATIVE Final   C Diff interpretation No C. difficile detected.  Final    Comment: Performed at [facility], [address] [place], [place] [address]    Coagulation Studies: Recent Labs    09/18/19 1058  LABPROT 22.2*  INR 2.0*    Urinalysis: Recent Labs    09/18/19 2240  COLORURINE STRAW*  LABSPEC 1.008  PHURINE 5.0  GLUCOSEU NEGATIVE  HGBUR LARGE*  BILIRUBINUR NEGATIVE  KETONESUR NEGATIVE  PROTEINUR 30*  NITRITE NEGATIVE  LEUKOCYTESUR TRACE*      Imaging: No results  found.   Medications:   . nafcillin (NAFCIL) continuous infusion 12 g (09/20/19 1853)  . sterile water 850 mL with potassium chloride 40 mEq, sodium bicarbonate 150 mEq infusion Stopped (09/20/19 2150)   . sodium chloride   Intravenous Once  . ALPRAZolam  1 mg Oral TID  . aspirin EC  81 mg Oral Daily  . Chlorhexidine Gluconate Cloth  6 each Topical Daily  . feeding supplement (ENSURE ENLIVE)  237 mL Oral TID BM  . nicotine  21 mg Transdermal Daily  . rifampin  300 mg Oral Q8H  . sodium bicarbonate  650 mg Oral Daily   acetaminophen, docusate sodium, HYDROmorphone (DILAUDID) injection, ipratropium-albuterol, ondansetron **OR** ondansetron (ZOFRAN) IV, polyethylene glycol, simethicone  Assessment/ Plan:  Andrea King is a 44 y.o. white female [name] is a 44 y.o. 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 Acute renal failure with metabolic acidosis, hypokalemia, proteinuria and hematuria Baseline creatinine of 0.8. Acute renal failure most likely secondary to endocarditis Holding gentamicin due to renal failure.  Potassium and anion gap have improved.  - Encourage PO intake.  - PO sodium bicarbonate and potassium chloride IV  2. Endocarditis: status post tricuspid valve replacement last week at [facility]. MSSA - nafcillin, rifampin.   - Appreciate ID input.   3. Anemia with renal failure: status post PRBC transfusion on 5/17 . Hemoglobin 7.4   LOS: 3 Andrea King 5/20/20218:46 AM

## 2019-09-21 NOTE — Progress Notes (Signed)
PROGRESS NOTE    Andrea King  NLG:[id number] DOB: Mar 24, 1976 DOA: 09/18/2019 PCP: Alberteen Spindle, CNM    Assessment & Plan:   Principal Problem:   Endocarditis of tricuspid valve Active Problems:   Opiate abuse, continuous (HCC)   AKI (acute kidney injury) (HCC)   Hypokalemia   Anemia in other chronic diseases classified elsewhere   Chronic hepatitis C without hepatic coma (HCC)   Metabolic acidosis   Septic pulmonary embolism (HCC)   MSSA bacteremia & tricuspid valve endocarditis: w/ septic pulmonary emboli in the setting of IV drug use in patient who left AMA after recent tricuspid valve redo. Has left AMA twice (from [facility] on 5/15 as well as [facility] on 4/30). Continue nafcillin, rifampin as recommended by ID, needs 6 total weeks (10/19/19). C. diff neg.   Normocytic anemia: s/p 1 unit of PRBCs this admission. Haptoglobin is WNL & LDH is elevated. H&H are labile. Will continue to monitor   AKI: Cr continues to trend down. 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 No current back pain   Polysubstance abuse: w/ opioids, tobacco and benzodiazepines. Dilaudid prn. Continue on xanax, methadone. Nicotine patch to prevent w/drawal. 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 Postop TTE on 5/10 at outside hospital showed preserved EF with mild transvalvular regurgitation of prosthetic valve. CT surgery recommended aspirin daily   Nausea, abdominal discomfort:  Symptoms seem consistent with gas.  We will try supportive care with simethicone as needed, if persists may warrant further imaging. Abdominal imaging at outside hospital showed hepatic congestion and some gallbladder thickening. LFTs here unremarkable.  Bilirubin unremarkable. Negative Murphy sign on exam. Simethicone, zofran prn   Chronic hepatitis C: without mental status changes.  HCV  antibody positive and HCVRNA negative  Metabolic acidosis: will continue po bicarb   Hypokalemia: KCl repleted. Will continue to monitor   Hypomagnesemia: mg sulfate ordered. Will continue to monitor   Thrombocytosis: etiology unclear, possibly reactive. Will continue to monitor  Leukocytosis: likely secondary to MSSA bacteremia. Continue on IV abxs   DVT prophylaxis: lovenox  Code Status: full  Family Communication:  Disposition Plan: possibly d/c to SNF as pt needs 6 weeks of IV abxs as per ID. Home health will not do IV abxs for pts w/ hx of IVDA  Status is: Inpatient  Remains inpatient appropriate because:IV treatments appropriate due to intensity of illness or inability to take PO   Dispo: The patient is from: Home              Anticipated d/c is to: SNF              Anticipated d/c date is: > 3 days              Patient currently is not medically stable to d/c.       Consultants:   nephro  ID   Procedures:    Antimicrobials: nafcillin, rifampin    Subjective: Pt c/o fatigue   Objective: Vitals:   09/20/19 1937 09/21/19 0337 09/21/19 0828 09/21/19 1200  BP: 121/66 117/73 120/73 118/65  Pulse: 93 95 90 94  Resp: 20 20 18    Temp: 98.2 F (36.8 C) 98.1 F (36.7 C) 97.9 F (36.6 C) 97.6 F (36.4 C)  TempSrc: Oral Oral Oral Oral  SpO2: 100% 98% 99% 99%  Weight:      Height:  Intake/Output Summary (Last 24 hours) at 09/21/2019 1440 Last data filed at 09/21/2019 1418 Gross per 24 hour  Intake 2105.46 ml  Output 1652 ml  Net 453.46 ml   Filed Weights   09/18/19 1953 09/19/19 0541 09/20/19 0512  Weight: 51 kg 51.3 kg 51.7 kg    Examination:  General exam: Appears calm and comfortable Respiratory system: decreased breath sounds b/l. No rales Cardiovascular system: S1 & S2 +. No rubs, gallops or clicks.  Gastrointestinal system: Abdomen is nondistended, soft and nontender. Hyperactive bowel sounds heard. Central nervous system: Alert  and oriented. Moves all 4 extremities  Psychiatry: Judgement and insight appear abnormal. Flat mood and affect     Data Reviewed: I have personally reviewed following labs and imaging studies  CBC: Recent Labs  Lab 09/18/19 1058 09/19/19 0021 09/20/19 0637 09/21/19 1229  WBC 13.1* 10.5 12.7* 12.8*  NEUTROABS 8.5*  --   --   --   HGB 6.5* 7.0* 7.4* 7.3*  HCT 20.4* 20.1* 21.4* 20.5*  MCV 85.7 82.0 81.4 80.1  PLT 713* 491* 656* 371*   Basic Metabolic Panel: Recent Labs  Lab 09/18/19 1058 09/19/19 0004 09/19/19 0021 09/20/19 0903 09/21/19 1229  NA 136  --  137 136 137  K 3.0*  --  3.0* 3.0* 3.4*  CL 111  --  115* 108 107  CO2 13*  --  13* 19* 21*  GLUCOSE 84  --  104* 94 101*  BUN 19  --  21* 20 16  CREATININE 2.00*  --  1.82* 1.50* 1.25*  CALCIUM 8.2*  --  7.8* 7.7* 7.5*  MG  --  2.0  --   --  1.6*   GFR: Estimated Creatinine Clearance: 45 mL/min (A) (by C-G formula based on SCr of 1.25 mg/dL (H)). Liver Function Tests: Recent Labs  Lab 09/18/19 1058  AST 10*  ALT 8  ALKPHOS 93  BILITOT 0.9  PROT 7.5  ALBUMIN 1.9*   No results for input(s): LIPASE, AMYLASE in the last 168 hours. No results for input(s): AMMONIA in the last 168 hours. Coagulation Profile: Recent Labs  Lab 09/18/19 1058  INR 2.0*   Cardiac Enzymes: No results for input(s): CKTOTAL, CKMB, CKMBINDEX, TROPONINI in the last 168 hours. BNP (last 3 results) No results for input(s): PROBNP in the last 8760 hours. HbA1C: No results for input(s): HGBA1C in the last 72 hours. CBG: No results for input(s): GLUCAP in the last 168 hours. Lipid Profile: No results for input(s): CHOL, HDL, LDLCALC, TRIG, CHOLHDL, LDLDIRECT in the last 72 hours. Thyroid Function Tests: No results for input(s): TSH, T4TOTAL, FREET4, T3FREE, THYROIDAB in the last 72 hours. Anemia Panel: No results for input(s): VITAMINB12, FOLATE, FERRITIN, TIBC, IRON, RETICCTPCT in the last 72 hours. Sepsis Labs: Recent Labs  Lab  09/18/19 1058  PROCALCITON 0.13    Recent Results (from the past 240 hour(s))  Culture, blood (routine x 2)     Status: None (Preliminary result)   Collection Time: 09/18/19 10:58 AM   Specimen: BLOOD LEFT ARM  Result Value Ref Range Status   Specimen Description BLOOD LEFT ARM  Final   Special Requests   Final    BOTTLES DRAWN AEROBIC AND ANAEROBIC Blood Culture adequate volume   Culture   Final    NO GROWTH 3 DAYS Performed at [facility], [address] [place], [place] [address]    Report Status PENDING  Incomplete  Culture, blood (routine x 2)  Status: None (Preliminary result)   Collection Time: 09/18/19 12:20 PM   Specimen: BLOOD  Result Value Ref Range Status   Specimen Description BLOOD RIGHT AC  Final   Special Requests   Final    BOTTLES DRAWN AEROBIC ONLY Blood Culture results may not be optimal due to an inadequate volume of blood received in culture bottles   Culture   Final    NO GROWTH 3 DAYS Performed at [facility], [address], [place], [place] [address]    Report Status PENDING  Incomplete  C Difficile Quick Screen w PCR reflex     Status: None   Collection Time: 09/19/19  7:01 AM   Specimen: STOOL  Result Value Ref Range Status   C Diff antigen NEGATIVE NEGATIVE Final   C Diff toxin NEGATIVE NEGATIVE Final   C Diff interpretation No C. difficile detected.  Final    Comment: Performed at [facility], [address], [place], [place] [address]         Radiology Studies: No results found.      Scheduled Meds: . sodium chloride   Intravenous Once  . ALPRAZolam  1 mg Oral TID  . aspirin EC  81 mg Oral Daily  . Chlorhexidine Gluconate Cloth  6 each Topical Daily  . enoxaparin (LOVENOX) injection  40 mg Subcutaneous Q24H  . feeding supplement (ENSURE ENLIVE)  237 mL Oral TID BM  . melatonin  2.5 mg Oral QHS  . nicotine  21 mg Transdermal Daily  . rifampin  300 mg Oral Q8H  . sodium bicarbonate  650 mg  Oral Daily   Continuous Infusions: . nafcillin (NAFCIL) continuous infusion 12 g (09/20/19 1853)     LOS: 3 days    Time spent: 30 mins     Charise Killian, MD [facility] Pager [phone]  If 7PM-7AM, please contact night-coverage [email or web address] 09/21/2019, 2:40 PM

## 2019-09-21 NOTE — Progress Notes (Addendum)
Due to safety concerns and illicit drug use this patient is to have NO visitors, visitors are restricted. Director, Evette aware.

## 2019-09-21 NOTE — Progress Notes (Signed)
Patient refused labs this AM. MD aware.

## 2019-09-21 NOTE — TOC Initial Note (Signed)
Transition of Care [facility]) - Initial/Assessment Note    Patient Details  Name: Andrea King MRN: [id number] Date of Birth: Feb 23, 1976  Transition of Care [facility]) CM/SW Contact:    Shawn Route, RN Phone Number: 09/21/2019, 2:18 PM  Clinical Narrative:                  Patient referred to [facility] and Medication Management to establish PCP and medication pharmacy.  Expected Discharge Plan: Skilled Nursing Facility Barriers to Discharge: Continued Medical Work up   Patient Goals and CMS Choice        Expected Discharge Plan and Services Expected Discharge Plan: Skilled Nursing Facility   Discharge Planning Services: CM Consult                                          Prior Living Arrangements/Services   Lives with:: Friends([place])                   Activities of Daily Living Home Assistive Devices/Equipment: None ADL Screening (condition at time of admission) Patient's cognitive ability adequate to safely complete daily activities?: Yes Is the patient deaf or have difficulty hearing?: No Does the patient have difficulty seeing, even when wearing glasses/contacts?: No Does the patient have difficulty concentrating, remembering, or making decisions?: No Patient able to express need for assistance with ADLs?: Yes Does the patient have difficulty dressing or bathing?: No Independently performs ADLs?: Yes (appropriate for developmental age) Does the patient have difficulty walking or climbing stairs?: No Weakness of Legs: None Weakness of Arms/Hands: None  Permission Sought/Granted                  Emotional Assessment              Admission diagnosis:  Bacteremia [R78.81] AKI (acute kidney injury) (HCC) [N17.9] H/O tricuspid valve replacement [Z95.2] 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 Metabolic acidosis 09/19/2019  . Septic pulmonary  embolism (HCC) 09/19/2019  . 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 AKI (acute kidney injury) (HCC) 09/18/2019  . Hypokalemia 09/18/2019  . Anemia in other chronic diseases classified elsewhere 09/18/2019  . Chronic hepatitis C without hepatic coma (HCC) 09/18/2019  . Protein-calorie malnutrition, severe 08/31/2019  . 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 Malnutrition of moderate degree 12/29/2017  . 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 Palliative care by specialist   . Goals of care, counseling/discussion   . Periorbital cellulitis 09/20/2017  . Hypoglycemia 09/20/2017  . Bacteremia due to Staphylococcus   . Sepsis (HCC) 11/13/2015  . Community acquired pneumonia 11/13/2015  . Pneumonia 11/13/2015  . Substance induced mood disorder (HCC) 11/13/2015  . Opiate withdrawal (HCC) 11/13/2015  . Opiate abuse, continuous (HCC) 11/13/2015  . Incarcerated femoral hernia left 04/28/2013   PCP:  Alberteen Spindle, CNM Pharmacy:   [facility] [address] [place], [place] - [address] [place] [address] Phone: [phone] Fax: [phone]  [facility] [place], [place] - [address] [place] [address] Phone: [phone] Fax: [phone]     Social Determinants of Health (SDOH) Interventions    Readmission Risk Interventions No flowsheet data found.

## 2019-09-22 LAB — CBC
HCT: 20.5 % — ABNORMAL LOW (ref 36.0–46.0)
Hemoglobin: 6.9 g/dL — ABNORMAL LOW (ref 12.0–15.0)
MCH: 28.2 pg (ref 26.0–34.0)
MCHC: 33.7 g/dL (ref 30.0–36.0)
MCV: 83.7 fL (ref 80.0–100.0)
Platelets: 600 10*3/uL — ABNORMAL HIGH (ref 150–400)
RBC: 2.45 MIL/uL — ABNORMAL LOW (ref 3.87–5.11)
RDW: 20.9 % — ABNORMAL HIGH (ref 11.5–15.5)
WBC: 12.6 10*3/uL — ABNORMAL HIGH (ref 4.0–10.5)
nRBC: 0 % (ref 0.0–0.2)

## 2019-09-22 LAB — BASIC METABOLIC PANEL
Anion gap: 9 (ref 5–15)
BUN: 13 mg/dL (ref 6–20)
CO2: 22 mmol/L (ref 22–32)
Calcium: 7.4 mg/dL — ABNORMAL LOW (ref 8.9–10.3)
Chloride: 105 mmol/L (ref 98–111)
Creatinine, Ser: 1.09 mg/dL — ABNORMAL HIGH (ref 0.44–1.00)
GFR calc Af Amer: >60 mL/min (ref >60)
GFR calc non Af Amer: >60 mL/min (ref >60)
Glucose, Bld: 84 mg/dL (ref 70–99)
Potassium: 3.2 mmol/L — ABNORMAL LOW (ref 3.5–5.1)
Sodium: 136 mmol/L (ref 135–145)

## 2019-09-22 LAB — PREPARE RBC (CROSSMATCH)

## 2019-09-22 LAB — MAGNESIUM: Magnesium: 1.7 mg/dL (ref 1.7–2.4)

## 2019-09-22 MED ORDER — POTASSIUM CHLORIDE CRYS ER 20 MEQ PO TBCR
20.0000 meq | EXTENDED_RELEASE_TABLET | Freq: Once | ORAL | Status: AC
  Administered 2019-09-22: 20 meq via ORAL
  Filled 2019-09-22: qty 1

## 2019-09-22 MED ORDER — SODIUM CHLORIDE 0.9% IV SOLUTION: Freq: Once | INTRAVENOUS | Status: DC

## 2019-09-22 MED ORDER — LOPERAMIDE HCL 2 MG PO CAPS
4.0000 mg | ORAL_CAPSULE | Freq: Once | ORAL | Status: AC
  Administered 2019-09-22: 4 mg via ORAL
  Filled 2019-09-22: qty 2

## 2019-09-22 MED ORDER — POTASSIUM CHLORIDE CRYS ER 20 MEQ PO TBCR
20.0000 meq | EXTENDED_RELEASE_TABLET | Freq: Every day | ORAL | Status: DC
  Administered 2019-09-23 – 2019-09-24 (×2): 20 meq via ORAL
  Filled 2019-09-22 (×3): qty 1

## 2019-09-22 MED ORDER — OXYCODONE-ACETAMINOPHEN 7.5-325 MG PO TABS
1.0000 | ORAL_TABLET | ORAL | Status: DC | PRN
  Administered 2019-09-22 – 2019-09-26 (×6): 1 via ORAL
  Filled 2019-09-22 (×6): qty 1

## 2019-09-22 NOTE — Progress Notes (Signed)
MD notified via secure text about pt's HR being ST in low 100s. No new orders at this time. I will continue to assess.

## 2019-09-22 NOTE — Progress Notes (Signed)
Will monitor

## 2019-09-22 NOTE — Progress Notes (Signed)
Floor RN contacted re: IV consult for IV start/blood draw. Patient has 2 working PIV and patient needing blood draw. Advised to contact phlebotomy need for blood draw and place another IV team consult if needed. Per floor RN she'll remove 2 PIV so IV team can restart PIV and draw the blood.

## 2019-09-22 NOTE — Progress Notes (Signed)
[facility]  ROUNDING NOTE   Subjective:   UOP .   Hemoglobin 6.9  Creatinine 1.09 (1.25) K 3.2 (3.4)  Objective:  Vital signs in last 24 hours:  Temp:  [97.6 F (36.4 C)-98.2 F (36.8 C)] 98.2 F (36.8 C) (05/21 0808) Pulse Rate:  [90-95] 91 (05/21 0808) Resp:  [16-18] 18 (05/21 0808) BP: (111-123)/(65-75) 113/70 (05/21 0808) SpO2:  [98 %-100 %] 98 % (05/21 0808)  Weight change:  Filed Weights   09/19/19 0541 09/20/19 0512  Weight: 51.3 kg 51.7 kg    Intake/Output: I/O last 3 completed shifts: In: 1316.9 [P.O.:720; IV Piggyback:596.9] Out: 2002 [Urine:2000; Stool:2]   Intake/Output this shift:  No intake/output data recorded.  Physical Exam: General: NAD,   Head: Normocephalic, atraumatic. Moist oral mucosal membranes  Eyes: Anicteric, PERRL  Neck: Supple, trachea midline  Lungs:  Clear to auscultation  Heart: Regular rate and rhythm  Abdomen:  Soft, nontender,   Extremities:  + peripheral edema.  Neurologic: Nonfocal, moving all four extremities  Skin: No lesions        Basic Metabolic Panel: Recent Labs  Lab 09/18/19 1058 09/18/19 1058 09/19/19 0004 09/19/19 0021 09/19/19 0021 09/20/19 0903 09/21/19 1229 09/22/19 0514  NA 136  --   --  137  --  136 137 136  K 3.0*  --   --  3.0*  --  3.0* 3.4* 3.2*  CL 111  --   --  115*  --  108 107 105  CO2 13*  --   --  13*  --  19* 21* 22  GLUCOSE 84  --   --  104*  --  94 101* 84  BUN 19  --   --  21*  --  20 16 13   CREATININE 2.00*  --   --  1.82*  --  1.50* 1.25* 1.09*  CALCIUM 8.2*   < >  --  7.8*   < > 7.7* 7.5* 7.4*  MG  --   --  2.0  --   --   --  1.6* 1.7   < > = values in this interval not displayed.    Liver Function Tests: Recent Labs  Lab 09/18/19 1058  AST 10*  ALT 8  ALKPHOS 93  BILITOT 0.9  PROT 7.5  ALBUMIN 1.9*   No results for input(s): LIPASE, AMYLASE in the last 168 hours. No results for input(s): AMMONIA in the last 168 hours.  CBC: Recent Labs   Lab 09/18/19 1058 09/19/19 0021 09/20/19 0637 09/21/19 1229 09/22/19 0514  WBC 13.1* 10.5 12.7* 12.8* 12.6*  NEUTROABS 8.5*  --   --   --   --   HGB 6.5* 7.0* 7.4* 7.3* 6.9*  HCT 20.4* 20.1* 21.4* 20.5* 20.5*  MCV 85.7 82.0 81.4 80.1 83.7  PLT 713* 491* 656* 579* 600*    Cardiac Enzymes: No results for input(s): CKTOTAL, CKMB, CKMBINDEX, TROPONINI in the last 168 hours.  BNP: Invalid input(s): POCBNP  CBG: No results for input(s): GLUCAP in the last 168 hours.  Microbiology: Results for orders placed or performed during the hospital encounter of 09/18/19  Culture, blood (routine x 2)     Status: None (Preliminary result)   Collection Time: 09/18/19 10:58 AM   Specimen: BLOOD LEFT ARM  Result Value Ref Range Status   Specimen Description BLOOD LEFT ARM  Final   Special Requests   Final    BOTTLES DRAWN AEROBIC AND ANAEROBIC Blood Culture adequate  volume   Culture   Final    NO GROWTH 4 DAYS Performed at [facility], [address] [place], [place] [address]    Report Status PENDING  Incomplete  Culture, blood (routine x 2)     Status: None (Preliminary result)   Collection Time: 09/18/19 12:20 PM   Specimen: BLOOD  Result Value Ref Range Status   Specimen Description BLOOD RIGHT AC  Final   Special Requests   Final    BOTTLES DRAWN AEROBIC ONLY Blood Culture results may not be optimal due to an inadequate volume of blood received in culture bottles   Culture   Final    NO GROWTH 4 DAYS Performed at [facility], [address] [place], [place] [address]    Report Status PENDING  Incomplete  C Difficile Quick Screen w PCR reflex     Status: None   Collection Time: 09/19/19  7:01 AM   Specimen: STOOL  Result Value Ref Range Status   C Diff antigen NEGATIVE NEGATIVE Final   C Diff toxin NEGATIVE NEGATIVE Final   C Diff interpretation No C. difficile detected.  Final    Comment: Performed at [facility], [address]  [place], [place] [address]    Coagulation Studies: No results for input(s): LABPROT, INR in the last 72 hours.  Urinalysis: No results for input(s): COLORURINE, LABSPEC, PHURINE, GLUCOSEU, HGBUR, BILIRUBINUR, KETONESUR, PROTEINUR, UROBILINOGEN, NITRITE, LEUKOCYTESUR in the last 72 hours.  Invalid input(s): APPERANCEUR    Imaging: No results found.   Medications:   . nafcillin (NAFCIL) continuous infusion 12 g (09/21/19 1649)   . sodium chloride   Intravenous Once  . sodium chloride   Intravenous Once  . ALPRAZolam  1 mg Oral TID  . aspirin EC  81 mg Oral Daily  . Chlorhexidine Gluconate Cloth  6 each Topical Daily  . enoxaparin (LOVENOX) injection  40 mg Subcutaneous Q24H  . feeding supplement (ENSURE ENLIVE)  237 mL Oral TID BM  . melatonin  2.5 mg Oral QHS  . nicotine  21 mg Transdermal Daily  . rifampin  300 mg Oral Q8H  . sodium bicarbonate  650 mg Oral Daily   acetaminophen, docusate sodium, HYDROmorphone (DILAUDID) injection, ipratropium-albuterol, ondansetron **OR** ondansetron (ZOFRAN) IV, polyethylene glycol, simethicone  Assessment/ Plan:  [name] is a 44 y.o. white female [name] is a 44 y.o. 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 Acute renal failure with metabolic acidosis, hypokalemia, proteinuria and hematuria Baseline creatinine of 0.8. Acute renal failure most likely secondary to endocarditis Holding gentamicin due to renal failure.  Potassium and anion gap have improved.  - Encourage PO intake.  - PO sodium bicarbonate and potassium chloride IV  2. Endocarditis: status post tricuspid valve replacement 5/7 at [facility]. MSSA - nafcillin, rifampin.   - Appreciate ID input.   3. Anemia with renal failure: status post PRBC transfusion on 5/17 . Hemoglobin 6.9 today.   Will follow up as an  outpatient. Please call with questions.    LOS: 4 Andrea King 5/21/202110:17 AM

## 2019-09-22 NOTE — Progress Notes (Signed)
PROGRESS NOTE    Andrea King  IEP:[id number] DOB: 1975/05/12 DOA: 09/18/2019 PCP: [name], CNM    Assessment & Plan:   Principal Problem:   Endocarditis of tricuspid valve Active Problems:   Opiate abuse, continuous ([place])   AKI (acute kidney injury) ([place])   Hypokalemia   Anemia in other chronic diseases classified elsewhere   Chronic hepatitis C without hepatic coma (HCC)   Metabolic acidosis   Septic pulmonary embolism (HCC)   MSSA bacteremia & tricuspid valve endocarditis: w/ septic pulmonary emboli in the setting of IV drug use in patient who left AMA after recent tricuspid valve redo. Has left AMA twice (from [facility] on 5/15 as well as [facility] on 4/30). Continue nafcillin, rifampin as recommended by ID, needs 6 total weeks (10/19/19). C. diff neg.   Normocytic anemia: s/p 1 unit of PRBCs this admission & transfuse 1 unit of PRBCs 09/22/19. Haptoglobin is WNL & LDH is elevated. H&H are labile. Will continue to monitor   AKI: Cr is trending down daily. 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 No current back pain   Polysubstance abuse: w/ opioids, tobacco and benzodiazepines. Dilaudid, percocet prn. Continue on xanax. Nicotine patch to prevent w/drawal. 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 Postop TTE on 5/10 at outside hospital showed preserved EF with mild transvalvular regurgitation of prosthetic valve. CT surgery recommended aspirin daily   Nausea, abdominal discomfort:  Symptoms seem consistent with gas.  We will try supportive care with simethicone prn, if persists may warrant further imaging. Abdominal imaging at outside hospital showed hepatic congestion and some gallbladder thickening. LFTs here unremarkable.  Bilirubin unremarkable.  Simethicone, zofran prn   Chronic hepatitis C: without mental status changes.  HCV antibody  positive and HCVRNA negative  Metabolic acidosis: resolved  Hypokalemia: KCl repleted. Will continue to monitor   Hypomagnesemia: WNL today. Will continue to monitor   Thrombocytosis: etiology unclear, possibly reactive. Will continue to monitor  Leukocytosis: likely secondary to MSSA bacteremia. Continue on IV abxs   DVT prophylaxis: lovenox  Code Status: full  Family Communication:  Disposition Plan: possibly d/c to SNF as pt needs 6 weeks of IV abxs as per ID. Home health will not do IV abxs for pts w/ hx of IVDA  Status is: Inpatient  Remains inpatient appropriate because:IV treatments appropriate due to intensity of illness or inability to take PO   Dispo: The patient is from: Home              Anticipated d/c is to: SNF              Anticipated d/c date is: > 3 days              Patient currently is not medically stable to d/c.       Consultants:   nephro  ID   Procedures:    Antimicrobials: nafcillin, rifampin    Subjective: Pt c/o back pain  Objective: Vitals:   09/21/19 1200 09/21/19 1557 09/21/19 2132 09/22/19 0507  BP: 118/65 123/66 113/70 111/75  Pulse: 94 95 91 90  Resp:  18 16 18   Temp: 97.6 F (36.4 C) 98.1 F (36.7 C) 98 F (36.7 C) 98 F (36.7 C)  TempSrc: Oral Oral  Oral  SpO2: 99% 100% 99% 99%  Weight:      Height:  Intake/Output Summary (Last 24 hours) at 09/22/2019 0747 Last data filed at 09/22/2019 0300 Gross per 24 hour  Intake 596.94 ml  Output 1252 ml  Net -655.06 ml   Filed Weights   09/19/19 0541 09/20/19 0512  Weight: 51.3 kg 51.7 kg    Examination:  General exam: Appears calm and comfortable Respiratory system: diminished breath sounds b/l. No wheezes Cardiovascular system: S1 & S2 +. No rubs, gallops or clicks.  Gastrointestinal system: Abdomen is nondistended, soft and nontender. Hyperactive bowel sounds heard. Central nervous system: Alert and oriented. Moves all 4 extremities  Psychiatry:  Judgement and insight appear abnormal. Flat mood and affect     Data Reviewed: I have personally reviewed following labs and imaging studies  CBC: Recent Labs  Lab 09/18/19 1058 09/19/19 0021 09/20/19 0637 09/21/19 1229 09/22/19 0514  WBC 13.1* 10.5 12.7* 12.8* 12.6*  NEUTROABS 8.5*  --   --   --   --   HGB 6.5* 7.0* 7.4* 7.3* 6.9*  HCT 20.4* 20.1* 21.4* 20.5* 20.5*  MCV 85.7 82.0 81.4 80.1 83.7  PLT 713* 491* 656* 579* 600*   Basic Metabolic Panel: Recent Labs  Lab 09/18/19 1058 09/19/19 0004 09/19/19 0021 09/20/19 0903 09/21/19 1229 09/22/19 0514  NA 136  --  137 136 137 136  K 3.0*  --  3.0* 3.0* 3.4* 3.2*  CL 111  --  115* 108 107 105  CO2 13*  --  13* 19* 21* 22  GLUCOSE 84  --  104* 94 101* 84  BUN 19  --  21* 20 16 13   CREATININE 2.00*  --  1.82* 1.50* 1.25* 1.09*  CALCIUM 8.2*  --  7.8* 7.7* 7.5* 7.4*  MG  --  2.0  --   --  1.6* 1.7   GFR: Estimated Creatinine Clearance: 51.5 mL/min (A) (by C-G formula based on SCr of 1.09 mg/dL (H)). Liver Function Tests: Recent Labs  Lab 09/18/19 1058  AST 10*  ALT 8  ALKPHOS 93  BILITOT 0.9  PROT 7.5  ALBUMIN 1.9*   No results for input(s): LIPASE, AMYLASE in the last 168 hours. No results for input(s): AMMONIA in the last 168 hours. Coagulation Profile: Recent Labs  Lab 09/18/19 1058  INR 2.0*   Cardiac Enzymes: No results for input(s): CKTOTAL, CKMB, CKMBINDEX, TROPONINI in the last 168 hours. BNP (last 3 results) No results for input(s): PROBNP in the last 8760 hours. HbA1C: No results for input(s): HGBA1C in the last 72 hours. CBG: No results for input(s): GLUCAP in the last 168 hours. Lipid Profile: No results for input(s): CHOL, HDL, LDLCALC, TRIG, CHOLHDL, LDLDIRECT in the last 72 hours. Thyroid Function Tests: No results for input(s): TSH, T4TOTAL, FREET4, T3FREE, THYROIDAB in the last 72 hours. Anemia Panel: No results for input(s): VITAMINB12, FOLATE, FERRITIN, TIBC, IRON, RETICCTPCT in  the last 72 hours. Sepsis Labs: Recent Labs  Lab 09/18/19 1058  PROCALCITON 0.13    Recent Results (from the past 240 hour(s))  Culture, blood (routine x 2)     Status: None (Preliminary result)   Collection Time: 09/18/19 10:58 AM   Specimen: BLOOD LEFT ARM  Result Value Ref Range Status   Specimen Description BLOOD LEFT ARM  Final   Special Requests   Final    BOTTLES DRAWN AEROBIC AND ANAEROBIC Blood Culture adequate volume   Culture   Final    NO GROWTH 4 DAYS Performed at [facility], [address] [place], [place]  Report Status PENDING  Incomplete  Culture, blood (routine x 2)     Status: None (Preliminary result)   Collection Time: 09/18/19 12:20 PM   Specimen: BLOOD  Result Value Ref Range Status   Specimen Description BLOOD RIGHT AC  Final   Special Requests   Final    BOTTLES DRAWN AEROBIC ONLY Blood Culture results may not be optimal due to an inadequate volume of blood received in culture bottles   Culture   Final    NO GROWTH 4 DAYS Performed at [facility], [address] [place], [place] [address]    Report Status PENDING  Incomplete  C Difficile Quick Screen w PCR reflex     Status: None   Collection Time: 09/19/19  7:01 AM   Specimen: STOOL  Result Value Ref Range Status   C Diff antigen NEGATIVE NEGATIVE Final   C Diff toxin NEGATIVE NEGATIVE Final   C Diff interpretation No C. difficile detected.  Final    Comment: Performed at [facility], [address] [place], [place] [address]         Radiology Studies: No results found.      Scheduled Meds: . sodium chloride   Intravenous Once  . ALPRAZolam  1 mg Oral TID  . aspirin EC  81 mg Oral Daily  . Chlorhexidine Gluconate Cloth  6 each Topical Daily  . enoxaparin (LOVENOX) injection  40 mg Subcutaneous Q24H  . feeding supplement (ENSURE ENLIVE)  237 mL Oral TID BM  . melatonin  2.5 mg Oral QHS  . nicotine  21 mg Transdermal Daily  .  rifampin  300 mg Oral Q8H  . sodium bicarbonate  650 mg Oral Daily   Continuous Infusions: . nafcillin (NAFCIL) continuous infusion 12 g (09/21/19 1649)     LOS: 4 days    Time spent: 33 mins     Charise Killian, MD [facility] Pager [phone]  If 7PM-7AM, please contact night-coverage [email or web address] 09/22/2019, 7:47 AM

## 2019-09-23 LAB — CBC
HCT: 23.2 % — ABNORMAL LOW (ref 36.0–46.0)
Hemoglobin: 8.2 g/dL — ABNORMAL LOW (ref 12.0–15.0)
MCH: 28.7 pg (ref 26.0–34.0)
MCHC: 35.3 g/dL (ref 30.0–36.0)
MCV: 81.1 fL (ref 80.0–100.0)
Platelets: 522 10*3/uL — ABNORMAL HIGH (ref 150–400)
RBC: 2.86 MIL/uL — ABNORMAL LOW (ref 3.87–5.11)
RDW: 19.3 % — ABNORMAL HIGH (ref 11.5–15.5)
WBC: 12.2 10*3/uL — ABNORMAL HIGH (ref 4.0–10.5)
nRBC: 0.2 % (ref 0.0–0.2)

## 2019-09-23 LAB — CULTURE, BLOOD (ROUTINE X 2)
Culture: NO GROWTH
Culture: NO GROWTH
Special Requests: ADEQUATE

## 2019-09-23 LAB — TYPE AND SCREEN
ABO/RH(D): O POS
Antibody Screen: NEGATIVE
Unit division: 0

## 2019-09-23 LAB — BASIC METABOLIC PANEL
Anion gap: 13 (ref 5–15)
BUN: 12 mg/dL (ref 6–20)
CO2: 17 mmol/L — ABNORMAL LOW (ref 22–32)
Calcium: 7.4 mg/dL — ABNORMAL LOW (ref 8.9–10.3)
Chloride: 104 mmol/L (ref 98–111)
Creatinine, Ser: 0.95 mg/dL (ref 0.44–1.00)
GFR calc Af Amer: >60 mL/min (ref >60)
GFR calc non Af Amer: >60 mL/min (ref >60)
Glucose, Bld: 84 mg/dL (ref 70–99)
Potassium: 3.6 mmol/L (ref 3.5–5.1)
Sodium: 134 mmol/L — ABNORMAL LOW (ref 135–145)

## 2019-09-23 LAB — BPAM RBC
Blood Product Expiration Date: 202106252359
ISSUE DATE / TIME: 202105211657
Unit Type and Rh: 5100

## 2019-09-23 LAB — MAGNESIUM: Magnesium: 1.7 mg/dL (ref 1.7–2.4)

## 2019-09-23 MED ORDER — HYDROMORPHONE HCL 2 MG PO TABS
5.0000 mg | ORAL_TABLET | ORAL | Status: DC | PRN
  Administered 2019-09-23 – 2019-09-26 (×18): 5 mg via ORAL
  Filled 2019-09-23 (×19): qty 3

## 2019-09-23 NOTE — Progress Notes (Addendum)
PROGRESS NOTE    Andrea King  KVQ:[id number] DOB: 1975-06-01 DOA: 09/18/2019 PCP: [name], CNM    Assessment & Plan:   Principal Problem:   Endocarditis of tricuspid valve Active Problems:   Opiate abuse, continuous ([place])   AKI (acute kidney injury) ([place])   Hypokalemia   Anemia in other chronic diseases classified elsewhere   Chronic hepatitis C without hepatic coma (HCC)   Metabolic acidosis   Septic pulmonary embolism (HCC)   MSSA bacteremia & tricuspid valve endocarditis: w/ septic pulmonary emboli in the setting of IV drug use in patient who left AMA after recent tricuspid valve redo. Has left AMA twice (from [facility] on 5/15 as well as [place] on 4/30). Continue nafcillin, rifampin as recommended by ID, needs 6 total weeks (10/19/19). C. diff neg.   Normocytic anemia: s/p 2 units of PRBCs. Haptoglobin is WNL & LDH is elevated. H&H are trending up today after 1 unit of pRBCs yesterday. 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 No current back pain   Polysubstance abuse: w/ opioids, tobacco and benzodiazepines. Changed to po dilaudid, percocet prn. Continue on xanax. Nicotine patch to prevent w/drawal. 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 Postop TTE on 5/10 at outside hospital showed preserved EF with mild transvalvular regurgitation of prosthetic valve. CT surgery recommended aspirin daily   Nausea, abdominal discomfort:  resolved  Chronic hepatitis C: without mental status changes.  HCV antibody positive and HCVRNA negative  Metabolic acidosis: will start bicarb   Hypokalemia: WNL today. Will continue to monitor   Hypomagnesemia: WNL today. Will continue to monitor   Thrombocytosis: etiology unclear, possibly reactive. Will continue to monitor  Leukocytosis: likely secondary to MSSA bacteremia. Continue on  IV abxs   DVT prophylaxis: lovenox  Code Status: full  Family Communication:  Disposition Plan: possibly d/c to SNF as pt needs 6 weeks of IV abxs as per ID. Home health will not do IV abxs for pts w/ hx of IVDA  Status is: Inpatient  Remains inpatient appropriate because:IV treatments appropriate due to intensity of illness or inability to take PO   Dispo: The patient is from: Home              Anticipated d/c is to: SNF              Anticipated d/c date is: > 3 days              Patient currently is not medically stable to d/c.       Consultants:   nephro  ID   Procedures:    Antimicrobials: nafcillin, rifampin    Subjective: Pt c/o back pain again today   Objective: Vitals:   09/22/19 2030 09/23/19 0448 09/23/19 0821 09/23/19 1127  BP: 119/72 114/74 131/77 115/71  Pulse: 80 83 88 93  Resp: 16 18 18 18   Temp: 98 F (36.7 C) 98.1 F (36.7 C) 98.1 F (36.7 C) 98.5 F (36.9 C)  TempSrc: Oral Oral Oral Oral  SpO2: 100% 99% 100% 100%  Weight:      Height:        Intake/Output Summary (Last 24 hours) at 09/23/2019 1248 Last data filed at 09/23/2019 1222 Gross per 24 hour  Intake 850 ml  Output 900 ml  Net -50 ml   Filed Weights   09/20/19 0512  Weight: 51.7  kg    Examination:  General exam: Appears calm and comfortable Respiratory system: decreased breath sounds b/l. No rales  Cardiovascular system: S1 & S2 +. No rubs, gallops or clicks.  Gastrointestinal system: Abdomen is nondistended, soft and nontender. Normal bowel sounds heard. Central nervous system: Alert and oriented. Moves all 4 extremities  Psychiatry: Judgement and insight appear normal. Flat mood and affect     Data Reviewed: I have personally reviewed following labs and imaging studies  CBC: Recent Labs  Lab 09/18/19 1058 09/18/19 1058 09/19/19 0021 09/20/19 0637 09/21/19 1229 09/22/19 0514 09/23/19 0854  WBC 13.1*   < > 10.5 12.7* 12.8* 12.6* 12.2*  NEUTROABS 8.5*  --    --   --   --   --   --   HGB 6.5*   < > 7.0* 7.4* 7.3* 6.9* 8.2*  HCT 20.4*   < > 20.1* 21.4* 20.5* 20.5* 23.2*  MCV 85.7   < > 82.0 81.4 80.1 83.7 81.1  PLT 713*   < > 491* 656* 579* 600* 522*   < > = values in this interval not displayed.   Basic Metabolic Panel: Recent Labs  Lab 09/18/19 1058 09/19/19 0004 09/19/19 0021 09/20/19 0903 09/21/19 1229 09/22/19 0514 09/23/19 0741  NA   < >  --  137 136 137 136 134*  K   < >  --  3.0* 3.0* 3.4* 3.2* 3.6  CL   < >  --  115* 108 107 105 104  CO2   < >  --  13* 19* 21* 22 17*  GLUCOSE   < >  --  104* 94 101* 84 84  BUN   < >  --  21* 20 16 13 12   CREATININE   < >  --  1.82* 1.50* 1.25* 1.09* 0.95  CALCIUM   < >  --  7.8* 7.7* 7.5* 7.4* 7.4*  MG  --  2.0  --   --  1.6* 1.7 1.7   < > = values in this interval not displayed.   GFR: Estimated Creatinine Clearance: 59.1 mL/min (by C-G formula based on SCr of 0.95 mg/dL). Liver Function Tests: Recent Labs  Lab 09/18/19 1058  AST 10*  ALT 8  ALKPHOS 93  BILITOT 0.9  PROT 7.5  ALBUMIN 1.9*   No results for input(s): LIPASE, AMYLASE in the last 168 hours. No results for input(s): AMMONIA in the last 168 hours. Coagulation Profile: Recent Labs  Lab 09/18/19 1058  INR 2.0*   Cardiac Enzymes: No results for input(s): CKTOTAL, CKMB, CKMBINDEX, TROPONINI in the last 168 hours. BNP (last 3 results) No results for input(s): PROBNP in the last 8760 hours. HbA1C: No results for input(s): HGBA1C in the last 72 hours. CBG: No results for input(s): GLUCAP in the last 168 hours. Lipid Profile: No results for input(s): CHOL, HDL, LDLCALC, TRIG, CHOLHDL, LDLDIRECT in the last 72 hours. Thyroid Function Tests: No results for input(s): TSH, T4TOTAL, FREET4, T3FREE, THYROIDAB in the last 72 hours. Anemia Panel: No results for input(s): VITAMINB12, FOLATE, FERRITIN, TIBC, IRON, RETICCTPCT in the last 72 hours. Sepsis Labs: Recent Labs  Lab 09/18/19 1058  PROCALCITON 0.13     Recent Results (from the past 240 hour(s))  Culture, blood (routine x 2)     Status: None   Collection Time: 09/18/19 10:58 AM   Specimen: BLOOD LEFT ARM  Result Value Ref Range Status   Specimen Description BLOOD LEFT ARM  Final  Special Requests   Final    BOTTLES DRAWN AEROBIC AND ANAEROBIC Blood Culture adequate volume   Culture   Final    NO GROWTH 5 DAYS Performed at [facility], [address] [place], [place] [address]    Report Status 09/23/2019 FINAL  Final  Culture, blood (routine x 2)     Status: None   Collection Time: 09/18/19 12:20 PM   Specimen: BLOOD  Result Value Ref Range Status   Specimen Description BLOOD RIGHT AC  Final   Special Requests   Final    BOTTLES DRAWN AEROBIC ONLY Blood Culture results may not be optimal due to an inadequate volume of blood received in culture bottles   Culture   Final    NO GROWTH 5 DAYS Performed at [facility], [address] [place], [place] [address]    Report Status 09/23/2019 FINAL  Final  C Difficile Quick Screen w PCR reflex     Status: None   Collection Time: 09/19/19  7:01 AM   Specimen: STOOL  Result Value Ref Range Status   C Diff antigen NEGATIVE NEGATIVE Final   C Diff toxin NEGATIVE NEGATIVE Final   C Diff interpretation No C. difficile detected.  Final    Comment: Performed at [facility], [address] [place], [place] [address]         Radiology Studies: No results found.      Scheduled Meds: . sodium chloride   Intravenous Once  . sodium chloride   Intravenous Once  . ALPRAZolam  1 mg Oral TID  . aspirin EC  81 mg Oral Daily  . Chlorhexidine Gluconate Cloth  6 each Topical Daily  . feeding supplement (ENSURE ENLIVE)  237 mL Oral TID BM  . melatonin  2.5 mg Oral QHS  . nicotine  21 mg Transdermal Daily  . potassium chloride  20 mEq Oral Daily  . rifampin  300 mg Oral Q8H  . sodium bicarbonate  650 mg Oral Daily   Continuous Infusions: .  nafcillin (NAFCIL) continuous infusion 12 g (09/21/19 1649)     LOS: 5 days    Time spent: 35 mins     Charise Killian, MD [facility] Pager [phone]  If 7PM-7AM, please contact night-coverage [email or web address] 09/23/2019, 12:48 PM

## 2019-09-23 NOTE — Plan of Care (Signed)
  Problem: Education: Goal: Knowledge of General Education information will improve Description: Including pain rating scale, medication(s)/side effects and non-pharmacologic comfort measures Outcome: Progressing   Problem: Health Behavior/Discharge Planning: Goal: Ability to manage health-related needs will improve Outcome: Not Progressing Note: Patient still requesting strong narcotics usually as soon as they are able to be given. Now on Q 4 hour PO Dilaudid. Have already administered PRN Dilaudid twice today. Will continue to monitor pain for the remainder of the shift. Jari Favre [facility]

## 2019-09-24 NOTE — Progress Notes (Addendum)
PROGRESS NOTE    Andrea King  PJA:[id number] DOB: 1976/03/23 DOA: 09/18/2019 PCP: [name], CNM    Assessment & Plan:   Principal Problem:   Endocarditis of tricuspid valve Active Problems:   Opiate abuse, continuous ([place])   AKI (acute kidney injury) ([place])   Hypokalemia   Anemia in other chronic diseases classified elsewhere   Chronic hepatitis C without hepatic coma (HCC)   Metabolic acidosis   Septic pulmonary embolism (HCC)   MSSA bacteremia & tricuspid valve endocarditis: w/ septic pulmonary emboli in the setting of IV drug use in patient who left AMA after recent tricuspid valve redo. Has left AMA twice (from [facility] on 5/15 as well as [facility] on 4/30). Continue nafcillin, rifampin as recommended by ID, needs 6 total weeks (10/19/19). C. diff neg.   Normocytic anemia: s/p 2 units of PRBCs. Haptoglobin is WNL & LDH is elevated. H&H are trending up today after 1 unit of pRBCs yesterday. 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 No current back pain   Polysubstance abuse: w/ opioids, tobacco and benzodiazepines. Changed to po dilaudid, percocet prn. No IV pain meds currently. Continue on xanax. Nicotine patch to prevent w/drawal. 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 Postop TTE on 5/10 at outside hospital showed preserved EF with mild transvalvular regurgitation of prosthetic valve. CT surgery recommended aspirin daily   Nausea, abdominal discomfort:  resolved  Chronic hepatitis C: without mental status changes.  HCV antibody positive and HCVRNA negative  Metabolic acidosis: will continue bicarb   Hypokalemia: WNL today. Will continue to monitor   Hypomagnesemia: WNL today. Will continue to monitor   Thrombocytosis: etiology unclear, possibly reactive. Will continue to monitor  Leukocytosis: likely secondary to  MSSA bacteremia. Continue on IV abxs   Generalized weakness: PT consulted   DVT prophylaxis: lovenox  Code Status: full  Family Communication:  Disposition Plan: possibly d/c to SNF as pt needs 6 weeks of IV abxs as per ID. Home health will not do IV abxs for pts w/ hx of IVDA  Status is: Inpatient  Remains inpatient appropriate because:IV treatments appropriate due to intensity of illness or inability to take PO   Dispo: The patient is from: Home              Anticipated d/c is to: SNF              Anticipated d/c date is: > 3 days              Patient currently is not medically stable to d/c.     Consultants:   nephro  ID   Procedures:    Antimicrobials: nafcillin, rifampin    Subjective: Pt c/o fatigue. Pt refused labs this morning.  Objective: Vitals:   09/23/19 1926 09/24/19 0422 09/24/19 0423 09/24/19 0738  BP: 110/70  109/70 (!) 114/59  Pulse: 88  90 92  Resp:    17  Temp: 98.6 F (37 C)  98.4 F (36.9 C) 98.1 F (36.7 C)  TempSrc: Oral  Oral Oral  SpO2: 99%  98% 99%  Weight:  49.3 kg 49.3 kg   Height:        Intake/Output Summary (Last 24 hours) at 09/24/2019 1131 Last data filed at 09/24/2019 0430 Gross per 24 hour  Intake --  Output 2200 ml  Net -2200 ml  Filed Weights   09/24/19 0422 09/24/19 0423  Weight: 49.3 kg 49.3 kg    Examination:  General exam: Appears calm and comfortable Respiratory system: decreased breath sounds b/l. No wheezes. Cardiovascular system: S1 & S2 +. No rubs, gallops or clicks.  Gastrointestinal system: Abdomen is nondistended, soft and nontender. Hypoactive bowel sounds heard. Central nervous system: Alert and oriented. Moves all 4 extremities  Psychiatry: Judgement and insight appear normal. Normal mood and affect     Data Reviewed: I have personally reviewed following labs and imaging studies  CBC: Recent Labs  Lab 09/18/19 1058 09/18/19 1058 09/19/19 0021 09/20/19 0637 09/21/19 1229  09/22/19 0514 09/23/19 0854  WBC 13.1*   < > 10.5 12.7* 12.8* 12.6* 12.2*  NEUTROABS 8.5*  --   --   --   --   --   --   HGB 6.5*   < > 7.0* 7.4* 7.3* 6.9* 8.2*  HCT 20.4*   < > 20.1* 21.4* 20.5* 20.5* 23.2*  MCV 85.7   < > 82.0 81.4 80.1 83.7 81.1  PLT 713*   < > 491* 656* 579* 600* 522*   < > = values in this interval not displayed.   Basic Metabolic Panel: Recent Labs  Lab 09/18/19 1058 09/19/19 0004 09/19/19 0021 09/20/19 0903 09/21/19 1229 09/22/19 0514 09/23/19 0741  NA   < >  --  137 136 137 136 134*  K   < >  --  3.0* 3.0* 3.4* 3.2* 3.6  CL   < >  --  115* 108 107 105 104  CO2   < >  --  13* 19* 21* 22 17*  GLUCOSE   < >  --  104* 94 101* 84 84  BUN   < >  --  21* 20 16 13 12   CREATININE   < >  --  1.82* 1.50* 1.25* 1.09* 0.95  CALCIUM   < >  --  7.8* 7.7* 7.5* 7.4* 7.4*  MG  --  2.0  --   --  1.6* 1.7 1.7   < > = values in this interval not displayed.   GFR: Estimated Creatinine Clearance: 58.2 mL/min (by C-G formula based on SCr of 0.95 mg/dL). Liver Function Tests: Recent Labs  Lab 09/18/19 1058  AST 10*  ALT 8  ALKPHOS 93  BILITOT 0.9  PROT 7.5  ALBUMIN 1.9*   No results for input(s): LIPASE, AMYLASE in the last 168 hours. No results for input(s): AMMONIA in the last 168 hours. Coagulation Profile: Recent Labs  Lab 09/18/19 1058  INR 2.0*   Cardiac Enzymes: No results for input(s): CKTOTAL, CKMB, CKMBINDEX, TROPONINI in the last 168 hours. BNP (last 3 results) No results for input(s): PROBNP in the last 8760 hours. HbA1C: No results for input(s): HGBA1C in the last 72 hours. CBG: No results for input(s): GLUCAP in the last 168 hours. Lipid Profile: No results for input(s): CHOL, HDL, LDLCALC, TRIG, CHOLHDL, LDLDIRECT in the last 72 hours. Thyroid Function Tests: No results for input(s): TSH, T4TOTAL, FREET4, T3FREE, THYROIDAB in the last 72 hours. Anemia Panel: No results for input(s): VITAMINB12, FOLATE, FERRITIN, TIBC, IRON, RETICCTPCT  in the last 72 hours. Sepsis Labs: Recent Labs  Lab 09/18/19 1058  PROCALCITON 0.13    Recent Results (from the past 240 hour(s))  Culture, blood (routine x 2)     Status: None   Collection Time: 09/18/19 10:58 AM   Specimen: BLOOD LEFT ARM  Result Value Ref  Range Status   Specimen Description BLOOD LEFT ARM  Final   Special Requests   Final    BOTTLES DRAWN AEROBIC AND ANAEROBIC Blood Culture adequate volume   Culture   Final    NO GROWTH 5 DAYS Performed at [facility], [address] [place], [place] [address]    Report Status 09/23/2019 FINAL  Final  Culture, blood (routine x 2)     Status: None   Collection Time: 09/18/19 12:20 PM   Specimen: BLOOD  Result Value Ref Range Status   Specimen Description BLOOD RIGHT AC  Final   Special Requests   Final    BOTTLES DRAWN AEROBIC ONLY Blood Culture results may not be optimal due to an inadequate volume of blood received in culture bottles   Culture   Final    NO GROWTH 5 DAYS Performed at [facility], [address] [place], [place] [address]    Report Status 09/23/2019 FINAL  Final  C Difficile Quick Screen w PCR reflex     Status: None   Collection Time: 09/19/19  7:01 AM   Specimen: STOOL  Result Value Ref Range Status   C Diff antigen NEGATIVE NEGATIVE Final   C Diff toxin NEGATIVE NEGATIVE Final   C Diff interpretation No C. difficile detected.  Final    Comment: Performed at [facility], [address], [place], [place] [address]         Radiology Studies: No results found.      Scheduled Meds: . sodium chloride   Intravenous Once  . sodium chloride   Intravenous Once  . ALPRAZolam  1 mg Oral TID  . aspirin EC  81 mg Oral Daily  . Chlorhexidine Gluconate Cloth  6 each Topical Daily  . feeding supplement (ENSURE ENLIVE)  237 mL Oral TID BM  . melatonin  2.5 mg Oral QHS  . nicotine  21 mg Transdermal Daily  . potassium chloride  20 mEq Oral Daily  . rifampin  300 mg  Oral Q8H  . sodium bicarbonate  650 mg Oral Daily   Continuous Infusions: . nafcillin (NAFCIL) continuous infusion 12 g (09/23/19 2121)     LOS: 6 days    Time spent: 30 mins     Charise Killian, MD [facility] Pager [phone]  If 7PM-7AM, please contact night-coverage [email or web address] 09/24/2019, 11:31 AM

## 2019-09-24 NOTE — Evaluation (Signed)
Physical Therapy Evaluation Patient Details Name: Andrea King MRN: [id number] DOB: [birth year] Today's Date: 09/24/2019   History of Present Illness  Patient is a 44 y/o F that presents with shortness of breath, septic pulmonary embolism after recent tricuspid valve re-do on 5/7.  Clinical Impression  Patient is a 44 y/o F that presents with shortness of breath likely secondary to pulmonary emoblism and recent tricuspid valve replacement re-do on 5/7. She is quite limited in her stamina and demonstrates decreased independence with ambulation this date using IV pole and decreased stride length. She is unable to provide thorough history, though given her age she is likely far from her baseline physically and would benefit from skilled PT services to address listed deficits.     Follow Up Recommendations Home health PT    Equipment Recommendations  Rolling walker with 5" wheels    Recommendations for Other Services       Precautions / Restrictions Precautions Precautions: None Restrictions Weight Bearing Restrictions: No      Mobility  Bed Mobility Overal bed mobility: Independent             General bed mobility comments: No deficits observed  Transfers Overall transfer level: Independent               General transfer comment: No deficits observed.  Ambulation/Gait Ambulation/Gait assistance: Modified independent (Device/Increase time) Gait Distance (Feet): 200 Feet Assistive device: IV Pole Gait Pattern/deviations: WFL(Within Functional Limits)   Gait velocity interpretation: <1.8 ft/sec, indicate of risk for recurrent falls General Gait Details: Patient is able to ambulate without LOB or shortness of breath increase for whole lap around RN station w/ IV pole.  Stairs            Wheelchair Mobility    Modified Rankin (Stroke Patients Only)       Balance Overall balance assessment: Modified Independent                                            Pertinent Vitals/Pain Pain Assessment: (Patient requested pain medication and Zofran before PT.)    Home Living Family/patient expects to be discharged to:: Private residence Living Arrangements: Non-relatives/Friends Available Help at Discharge: Family Type of Home: House       Home Layout: One level Home Equipment: None      Prior Function Level of Independence: Independent         Comments: Patient has been quite limited by recent cardiac procedure. Does not elaborate more on her current mobility level(s).     Hand Dominance        Extremity/Trunk Assessment   Upper Extremity Assessment Upper Extremity Assessment: Overall WFL for tasks assessed    Lower Extremity Assessment Lower Extremity Assessment: Overall WFL for tasks assessed       Communication   Communication: No difficulties  Cognition Arousal/Alertness: Awake/alert Behavior During Therapy: WFL for tasks assessed/performed Overall Cognitive Status: Within Functional Limits for tasks assessed                                        General Comments      Exercises     Assessment/Plan    PT Assessment Patient needs continued PT services  PT Problem List Decreased strength;Decreased mobility;Decreased coordination;Decreased activity tolerance;Cardiopulmonary  status limiting activity;Decreased balance;Decreased knowledge of use of DME       PT Treatment Interventions DME instruction;Therapeutic exercise;Gait training;Balance training;Stair training;Neuromuscular re-education;Functional mobility training;Therapeutic activities;Patient/family education    PT Goals (Current goals can be found in the Care Plan section)  Acute Rehab PT Goals Patient Stated Goal: To return home safely PT Goal Formulation: With patient Time For Goal Achievement: 10/08/19 Potential to Achieve Goals: Fair    Frequency Min 2X/week   Barriers to discharge Other  (comment) Patient requiring 6 weeks of IV abx    Co-evaluation               AM-PAC PT "6 Clicks" Mobility  Outcome Measure Help needed turning from your back to your side while in a flat bed without using bedrails?: None Help needed moving from lying on your back to sitting on the side of a flat bed without using bedrails?: None Help needed moving to and from a bed to a chair (including a wheelchair)?: None Help needed standing up from a chair using your arms (e.g., wheelchair or bedside chair)?: None Help needed to walk in hospital room?: None Help needed climbing 3-5 steps with a railing? : A Little 6 Click Score: 23    End of Session Equipment Utilized During Treatment: (Patient declined gait belt) Activity Tolerance: Patient tolerated treatment well Patient left: in bed Nurse Communication: Mobility status PT Visit Diagnosis: Unsteadiness on feet (R26.81);Difficulty in walking, not elsewhere classified (R26.2)    Time: 2505-3976 PT Time Calculation (min) (ACUTE ONLY): 21 min   Charges:   PT Evaluation $PT Eval Moderate Complexity: 1 Mod         Alva Garnet PT, DPT, CSCS    09/24/2019, 3:34 PM

## 2019-09-25 LAB — CBC
HCT: 24.9 % — ABNORMAL LOW (ref 36.0–46.0)
Hemoglobin: 8.5 g/dL — ABNORMAL LOW (ref 12.0–15.0)
MCH: 28.6 pg (ref 26.0–34.0)
MCHC: 34.1 g/dL (ref 30.0–36.0)
MCV: 83.8 fL (ref 80.0–100.0)
Platelets: 477 10*3/uL — ABNORMAL HIGH (ref 150–400)
RBC: 2.97 MIL/uL — ABNORMAL LOW (ref 3.87–5.11)
RDW: 19.4 % — ABNORMAL HIGH (ref 11.5–15.5)
WBC: 10.1 10*3/uL (ref 4.0–10.5)
nRBC: 0 % (ref 0.0–0.2)

## 2019-09-25 LAB — COMPREHENSIVE METABOLIC PANEL
ALT: 8 U/L (ref 0–44)
AST: 9 U/L — ABNORMAL LOW (ref 15–41)
Albumin: 1.7 g/dL — ABNORMAL LOW (ref 3.5–5.0)
Alkaline Phosphatase: 85 U/L (ref 38–126)
Anion gap: 8 (ref 5–15)
BUN: 9 mg/dL (ref 6–20)
CO2: 19 mmol/L — ABNORMAL LOW (ref 22–32)
Calcium: 7.5 mg/dL — ABNORMAL LOW (ref 8.9–10.3)
Chloride: 105 mmol/L (ref 98–111)
Creatinine, Ser: 0.93 mg/dL (ref 0.44–1.00)
GFR calc Af Amer: >60 mL/min (ref >60)
GFR calc non Af Amer: >60 mL/min (ref >60)
Glucose, Bld: 114 mg/dL — ABNORMAL HIGH (ref 70–99)
Potassium: 3 mmol/L — ABNORMAL LOW (ref 3.5–5.1)
Sodium: 132 mmol/L — ABNORMAL LOW (ref 135–145)
Total Bilirubin: 1.3 mg/dL — ABNORMAL HIGH (ref 0.3–1.2)
Total Protein: 6.9 g/dL (ref 6.5–8.1)

## 2019-09-25 LAB — MAGNESIUM: Magnesium: 1.6 mg/dL — ABNORMAL LOW (ref 1.7–2.4)

## 2019-09-25 MED ORDER — POTASSIUM CHLORIDE 10 MEQ/100ML IV SOLN
10.0000 meq | INTRAVENOUS | Status: DC
  Filled 2019-09-25 (×4): qty 100

## 2019-09-25 MED ORDER — MAGNESIUM SULFATE 2 GM/50ML IV SOLN
2.0000 g | Freq: Once | INTRAVENOUS | Status: AC
  Administered 2019-09-25: 2 g via INTRAVENOUS
  Filled 2019-09-25: qty 50

## 2019-09-25 MED ORDER — POTASSIUM CHLORIDE 20 MEQ PO PACK
40.0000 meq | PACK | Freq: Once | ORAL | Status: AC
  Administered 2019-09-25: 40 meq via ORAL
  Filled 2019-09-25: qty 2

## 2019-09-25 MED ORDER — POTASSIUM CHLORIDE 20 MEQ PO PACK: 20.0000 meq | PACK | Freq: Every day | ORAL | Status: DC

## 2019-09-25 NOTE — Progress Notes (Signed)
Pt actively vomiting. Given 4mg  IV zofran. Continues to complain of nausea. Given ice and ginger ale. Pt requesting Phenergan. NP on unit and I asked her about a PRN dose of Phenergan. She said if pt begins vomiting again she will order Phenergan. Patient is resting at this time. Will continue to assess status.

## 2019-09-25 NOTE — Progress Notes (Signed)
PROGRESS NOTE    Andrea King  FBP:[id number] DOB: [birth year] DOA: 09/18/2019 PCP: [name], CNM    Assessment & Plan:   Principal Problem:   Endocarditis of tricuspid valve Active Problems:   Opiate abuse, continuous ([place])   AKI (acute kidney injury) ([place])   Hypokalemia   Anemia in other chronic diseases classified elsewhere   Chronic hepatitis C without hepatic coma (HCC)   Metabolic acidosis   Septic pulmonary embolism (HCC)   MSSA bacteremia & tricuspid valve endocarditis: w/ septic pulmonary emboli in the setting of IV drug use in patient who left AMA after recent tricuspid valve redo. Has left AMA twice (from [facility] on 5/15 as well as [facility] on 4/30). Continue nafcillin, rifampin as recommended by ID, needs 6 total weeks (10/19/19). C. diff neg.   Normocytic anemia: s/p 2 units of PRBCs. Haptoglobin is WNL & LDH is elevated. H&H are stable. 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 No current back pain   Polysubstance abuse: w/ opioids, tobacco and benzodiazepines. Changed to po dilaudid, percocet prn. No IV pain meds currently. Continue on xanax. Nicotine patch to prevent w/drawal. 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 Postop TTE on 5/10 at outside hospital showed preserved EF with mild transvalvular regurgitation of prosthetic valve. CT surgery recommended aspirin daily   Nausea, abdominal discomfort:  resolved  Chronic hepatitis C: without mental status changes.  HCV antibody positive and HCVRNA negative  Metabolic acidosis: will continue bicarb   Hypokalemia: WNL today. Will continue to monitor   Hypomagnesemia: WNL today. Will continue to monitor   Thrombocytosis: etiology unclear, possibly reactive. Will continue to monitor  Leukocytosis: likely secondary to MSSA bacteremia. Continue on IV abxs    Generalized weakness: PT recs home health   DVT prophylaxis: lovenox  Code Status: full  Family Communication:  Disposition Plan: possibly d/c to SNF as pt needs 6 weeks of IV abxs as per ID. Home health will not do IV abxs for pts w/ hx of IVDA  Status is: Inpatient  Remains inpatient appropriate because:IV treatments appropriate due to intensity of illness or inability to take PO   Dispo: The patient is from: Home              Anticipated d/c is to: SNF              Anticipated d/c date is: > 3 days              Patient currently is not medically stable to d/c.     Consultants:   nephro  ID   Procedures:    Antimicrobials: nafcillin, rifampin    Subjective: Pt c/o malaise  Objective: Vitals:   09/24/19 2056 09/25/19 0530 09/25/19 0748 09/25/19 1125  BP: 118/64 119/64 119/71 114/72  Pulse: 94 88 86 93  Resp: 20 18 16 16   Temp: 99 F (37.2 C) 98.6 F (37 C) 98.6 F (37 C) 98.4 F (36.9 C)  TempSrc: Oral Oral Oral   SpO2: 98% 98% 98% 99%  Weight:      Height:        Intake/Output Summary (Last 24 hours) at 09/25/2019 1411 Last data filed at 09/25/2019 0705 Gross per 24 hour  Intake 943.83 ml  Output 450 ml  Net 493.83 ml   Filed Weights   09/24/19 0422 09/24/19 0423  Weight:  49.3 kg 49.3 kg    Examination:  General exam: Appears calm and comfortable Respiratory system: diminished breath sounds b/l. No rales, rhonchi Cardiovascular system: S1 & S2 +. No rubs, gallops or clicks.  Gastrointestinal system: Abdomen is nondistended, soft and nontender. Hypoactive bowel sounds heard. Central nervous system: Alert and oriented. Moves all 4 extremities  Psychiatry: Judgement and insight appear normal. Normal mood and affect     Data Reviewed: I have personally reviewed following labs and imaging studies  CBC: Recent Labs  Lab 09/20/19 0637 09/21/19 1229 09/22/19 0514 09/23/19 0854 09/25/19 1345  WBC 12.7* 12.8* 12.6* 12.2* 10.1  HGB 7.4*  7.3* 6.9* 8.2* 8.5*  HCT 21.4* 20.5* 20.5* 23.2* 24.9*  MCV 81.4 80.1 83.7 81.1 83.8  PLT 656* 579* 600* 522* 477*   Basic Metabolic Panel: Recent Labs  Lab 09/19/19 0004 09/19/19 0021 09/20/19 0903 09/21/19 1229 09/22/19 0514 09/23/19 0741  NA  --  137 136 137 136 134*  K  --  3.0* 3.0* 3.4* 3.2* 3.6  CL  --  115* 108 107 105 104  CO2  --  13* 19* 21* 22 17*  GLUCOSE  --  104* 94 101* 84 84  BUN  --  21* 20 16 13 12   CREATININE  --  1.82* 1.50* 1.25* 1.09* 0.95  CALCIUM  --  7.8* 7.7* 7.5* 7.4* 7.4*  MG 2.0  --   --  1.6* 1.7 1.7   GFR: Estimated Creatinine Clearance: 58.2 mL/min (by C-G formula based on SCr of 0.95 mg/dL). Liver Function Tests: No results for input(s): AST, ALT, ALKPHOS, BILITOT, PROT, ALBUMIN in the last 168 hours. No results for input(s): LIPASE, AMYLASE in the last 168 hours. No results for input(s): AMMONIA in the last 168 hours. Coagulation Profile: No results for input(s): INR, PROTIME in the last 168 hours. Cardiac Enzymes: No results for input(s): CKTOTAL, CKMB, CKMBINDEX, TROPONINI in the last 168 hours. BNP (last 3 results) No results for input(s): PROBNP in the last 8760 hours. HbA1C: No results for input(s): HGBA1C in the last 72 hours. CBG: No results for input(s): GLUCAP in the last 168 hours. Lipid Profile: No results for input(s): CHOL, HDL, LDLCALC, TRIG, CHOLHDL, LDLDIRECT in the last 72 hours. Thyroid Function Tests: No results for input(s): TSH, T4TOTAL, FREET4, T3FREE, THYROIDAB in the last 72 hours. Anemia Panel: No results for input(s): VITAMINB12, FOLATE, FERRITIN, TIBC, IRON, RETICCTPCT in the last 72 hours. Sepsis Labs: No results for input(s): PROCALCITON, LATICACIDVEN in the last 168 hours.  Recent Results (from the past 240 hour(s))  Culture, blood (routine x 2)     Status: None   Collection Time: 09/18/19 10:58 AM   Specimen: BLOOD LEFT ARM  Result Value Ref Range Status   Specimen Description BLOOD LEFT ARM  Final    Special Requests   Final    BOTTLES DRAWN AEROBIC AND ANAEROBIC Blood Culture adequate volume   Culture   Final    NO GROWTH 5 DAYS Performed at [facility], [address], [place], [place]    Report Status 09/23/2019 FINAL  Final  Culture, blood (routine x 2)     Status: None   Collection Time: 09/18/19 12:20 PM   Specimen: BLOOD  Result Value Ref Range Status   Specimen Description BLOOD RIGHT AC  Final   Special Requests   Final    BOTTLES DRAWN AEROBIC ONLY Blood Culture results may not be optimal due to an inadequate volume of  blood received in culture bottles   Culture   Final    NO GROWTH 5 DAYS Performed at [facility], [address] [place], [place] [address]    Report Status 09/23/2019 FINAL  Final  C Difficile Quick Screen w PCR reflex     Status: None   Collection Time: 09/19/19  7:01 AM   Specimen: STOOL  Result Value Ref Range Status   C Diff antigen NEGATIVE NEGATIVE Final   C Diff toxin NEGATIVE NEGATIVE Final   C Diff interpretation No C. difficile detected.  Final    Comment: Performed at [facility], [address] [place], [place] [address]         Radiology Studies: No results found.      Scheduled Meds: . sodium chloride   Intravenous Once  . sodium chloride   Intravenous Once  . ALPRAZolam  1 mg Oral TID  . aspirin EC  81 mg Oral Daily  . Chlorhexidine Gluconate Cloth  6 each Topical Daily  . feeding supplement (ENSURE ENLIVE)  237 mL Oral TID BM  . melatonin  2.5 mg Oral QHS  . nicotine  21 mg Transdermal Daily  . potassium chloride  20 mEq Oral Daily  . rifampin  300 mg Oral Q8H  . sodium bicarbonate  650 mg Oral Daily   Continuous Infusions: . nafcillin (NAFCIL) continuous infusion 12 g (09/24/19 2051)     LOS: 7 days    Time spent: 31 mins     Charise Killian, MD [facility] Pager [phone]  If 7PM-7AM, please contact night-coverage [email or web address] 09/25/2019,  2:11 PM

## 2019-09-25 NOTE — Progress Notes (Signed)
ID Pt doing well Ambulating No pain Is going to have a shower  Patient Vitals for the past 24 hrs:  BP Temp Temp src Pulse Resp SpO2  09/25/19 1551 118/66 98.5 F (36.9 C) -- 98 16 97 %  09/25/19 1125 114/72 98.4 F (36.9 C) -- 93 16 99 %  09/25/19 0748 119/71 98.6 F (37 C) Oral 86 16 98 %  09/25/19 0530 119/64 98.6 F (37 C) Oral 88 18 98 %  09/24/19 2056 118/64 99 F (37.2 C) Oral 94 20 98 %    CBC Latest Ref Rng & Units 09/25/2019 09/23/2019 09/22/2019  WBC 4.0 - 10.5 K/uL 10.1 12.2(H) 12.6(H)  Hemoglobin 12.0 - 15.0 g/dL 9.2(J) 1.9(E) 6.9(L)  Hematocrit 36.0 - 46.0 % 24.9(L) 23.2(L) 20.5(L)  Platelets 150 - 400 K/uL 477(H) 522(H) 600(H)    CMP Latest Ref Rng & Units 09/25/2019 09/23/2019 09/22/2019  Glucose 70 - 99 mg/dL 174(Y) 84 84  BUN 6 - 20 mg/dL 9 12 13   Creatinine 0.44 - 1.00 mg/dL 8.14 4.81)  Sodium 135 - 145 mmol/L 132(L) 134(L) 136  Potassium 3.5 - 5.1 mmol/L 3.0(L) 3.6 3.2(L)  Chloride 98 - 111 mmol/L 105 104 105  CO2 22 - 32 mmol/L 19(L) 17(L) 22  Calcium 8.9 - 10.3 mg/dL 7.5(L) 7.4(L) 7.4(L)  Total Protein 6.5 - 8.1 g/dL 6.9 - -  Total Bilirubin 0.3 - 1.2 mg/dL 8.56(D) - -  Alkaline Phos 38 - 126 U/L 85 - -  AST 15 - 41 U/L 9(L) - -  ALT 0 - 44 U/L 8 - -    Impression/recommendation MSSA bacteremia with tricuspid valve endocarditis and septic emboli since 08/06/19 with stay at [facility]/ [facility]-then back to [facility] - AMA twice- non compliant Last transfer to [facility] on 4/30 and underwent redo tricuspid valve replacement and PFO repair Valve culture from 09/08/19 is MSSA Pt is on nafcillin + rifampin Left AMA on 5/15 from [facility] and is here now [facility] 5/17 NG  Will Need 6 weeks of IV antibiotic -- 10/19/19 Cannot be discharged home with PICC  mild elevation in bilirubin- but ast/alt okaywatch closely as on rifampin/ nafcillin  Anemia-s/p PRBC  AKI_resolved  L5-S1 discitis/OM presacral abscess as noted at [facility] 4/421  Cervical and lumbar spine osteomyelitis In  October/November/December 2019 with Serratia bacteremia, MRSA, Enterococcus C4-T2 fusion  HCV RNA negative, reactive ab  Discussed with patient.

## 2019-09-25 NOTE — Progress Notes (Signed)
Checked on patient. She woke up and needed to pee. Helped her to bedside commode. Agitated state. Gave her milk she requested. Pt requesting Phenergan at this time. Not currently vomiting. Will continue to assess per NP Manuela Schwartz previous conversation.

## 2019-09-26 LAB — BASIC METABOLIC PANEL
Anion gap: 8 (ref 5–15)
BUN: 7 mg/dL (ref 6–20)
CO2: 20 mmol/L — ABNORMAL LOW (ref 22–32)
Calcium: 7.7 mg/dL — ABNORMAL LOW (ref 8.9–10.3)
Chloride: 102 mmol/L (ref 98–111)
Creatinine, Ser: 0.86 mg/dL (ref 0.44–1.00)
GFR calc Af Amer: >60 mL/min (ref >60)
GFR calc non Af Amer: >60 mL/min (ref >60)
Glucose, Bld: 93 mg/dL (ref 70–99)
Potassium: 3.5 mmol/L (ref 3.5–5.1)
Sodium: 130 mmol/L — ABNORMAL LOW (ref 135–145)

## 2019-09-26 MED ORDER — PROMETHAZINE HCL 25 MG/ML IJ SOLN
12.5000 mg | INTRAMUSCULAR | Status: AC
  Administered 2019-09-26: 12.5 mg via INTRAVENOUS
  Filled 2019-09-26: qty 1

## 2019-09-26 MED ORDER — METOCLOPRAMIDE HCL 5 MG/ML IJ SOLN
5.0000 mg | Freq: Once | INTRAMUSCULAR | Status: AC
  Administered 2019-09-26: 5 mg via INTRAVENOUS
  Filled 2019-09-26: qty 2

## 2019-09-26 MED ORDER — SCOPOLAMINE 1 MG/3DAYS TD PT72
1.0000 | MEDICATED_PATCH | TRANSDERMAL | Status: DC
  Administered 2019-09-26: 1.5 mg via TRANSDERMAL
  Filled 2019-09-26: qty 1

## 2019-09-26 MED ORDER — POTASSIUM CHLORIDE CRYS ER 20 MEQ PO TBCR
20.0000 meq | EXTENDED_RELEASE_TABLET | Freq: Every day | ORAL | Status: DC
  Administered 2019-09-26: 20 meq via ORAL
  Filled 2019-09-26: qty 1

## 2019-09-26 NOTE — Plan of Care (Addendum)
Per 3rd shift report and morning assessments - Patient has been refusing care all night and is continuing this AM. (NO labs, vitals, potassium replacements, has been periodically turning off IV anbx at the pump and removing tele.)  Dr. Informed of complications.  I DID have a heart to heart discussion with patient and clearly acknowleged her ability to make choices for her care, but needed to make sure she understood the severity of her situation and the possible consequences of refusing care.  Patient voiced understanding that she IS really sick and her condition could get much worse or even lead to death if not treated.  Patient stated "I will try to cooperate, but I'm so sick."

## 2019-09-26 NOTE — Progress Notes (Addendum)
Cross Cover brief note Patient c/o ongoing nausea.  Nurse reports patient drinking mountain dew all night and heart rate 120's. Also nurse reports suspicious behaviors displayed by patient with regards to personal belongings.  Was advised of effect of blood sugar and heart rate from caffeine content of beverage.  Ordered CBG UDS and alcohol level.  Patient reused to have CBG done.  Explained to patient the inability to treat her if we could not determine etiology of symptom. Patient continued to refuse. Additionally nurse reports patient not only refusing vitals and refusing heart monitor but continues to turn IV antbiotic infusion off.  When asked why she says she does not want it and when re explained purpose of antibiotic therapy she stated she didn't care, she did not want it.  Deferring to attending input for psych eval need as behavior could be interpreted as self harm risk but patient did not display other suicide ideation

## 2019-09-26 NOTE — Progress Notes (Signed)
Patient refusing meds and lab draws. Asking for pain meds. Freq too close at this time.

## 2019-09-26 NOTE — Progress Notes (Signed)
Pt received 12.5 mg of Phenergan IV diluted in 82mL NS pushed over 4 minutes for nausea and dry heaving. Will reassess patient in 15 minutes then 30 minutes for effectiveness.

## 2019-09-26 NOTE — Progress Notes (Signed)
Patient signs AMA form. This nurse and NP B. Jon Billings witness. When asked if she is aware of potential risk of leaving she states" I could die".

## 2019-09-26 NOTE — Progress Notes (Signed)
Informed of Dr. Mayford Knife stating she saw patient put a syringe back into her bag when she entered for morning rounds. Discussed with patient the policy of searching belongings when there is a reasonable suspicion of contraband. After three attempts to search belongings, patient agreed to room and belonging search by Marchelle Folks, AD and [place], [facility]. Very detailed search completed with no findings of contraband. Nursing will continue to complete frequent rounds to ensure patient safety. Patient in agreement with treatment plan after beside discussion with Dr. Mayford Knife.

## 2019-09-26 NOTE — Progress Notes (Signed)
This notified by [place] that patient requesting IV removed as she is leaving AMA. This nurse ask patient what is the reasoning and she states she just cant take it here any longer. NP B. Jon Billings notified of patient leaving.

## 2019-09-26 NOTE — Progress Notes (Signed)
PROGRESS NOTE    Andrea King  VEL:[id number] DOB: 03-May-1976 DOA: 09/18/2019 PCP: [facility], CNM  HPI was taken from Andrea [facility]: Andrea King is a 44 y.o. 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 She left AMA 4/6, then re-presented to [facility] 4/28 with fever, malaise, and TTE confirming 1.2x0.5cm TV veg with moderate TR prompting transfer to [facility] on September 01, 2019 for consideration of surgical valvular repair for tricuspid vegetations with endocarditis secondary to MSSA bacteremia. Patient is status post redo Sternotomy,Tricupsid valve replacement, Repair PFO on 09/08/19. Post op TTE completed 5/10 with EF 50-55%, mild transvalvular regurgitation of prosthetic valve. Patient was seen by infectious disease  5/7 OR cultures of heart valve - NGTD 5/11 repeat blood cultures x2 drawn on 05/11 Recommend to continue nafcillin (anticipate 6 week course of therapy), end date TBD  Continue gentamicin (plan is for 2 weeks tentatively with end date of 5/17) Continue rifampin po tid  Repeat MRI C spine to eval for osteo, abscesses per ID however patient has wires that will be cut -  Negative c diff on 05/04  Patient signed out from [facility] AGAINST MEDICAL ADVICE on 09/17/19 and was given a prescription for Augmentin 875 mg p.o. twice daily (for 28 days, end date 10/15/19).  Patient did not fill the prescription and was brought into the emergency room by her friend because he states that she was not acting right.  Patient was noted to be  lethargic in triage and not responding to verbal stimuli.  She also had pinpoint pupils and her friend stated that she had used heroin.  The ER nurse was going to give Narcan but patient declined.  She is currently awake and alert. Labs reveal a serum creatinine of 2.0 above her baseline of 1.27.  Potassium of 3 and hemoglobin of 6.5g/dl.  ED Course: 44 year old female status post recent tricuspid valve replacement and PFO repair at [facility] for MSSA endocarditis who signed out AGAINST MEDICAL ADVICE from [facility] ([facility]) and was brought into the emergency room for evaluation of altered mental status.  Patient will be admitted to complete her IV antibiotic therapy.  She received a dose of gentamicin in the ER.  Patient to be admitted for further evaluation.  Assessment & Plan:   Principal Problem:   Endocarditis of tricuspid valve Active Problems:   Opiate abuse, continuous (HCC)   AKI (acute kidney injury) (HCC)   Hypokalemia   Anemia in other chronic diseases classified elsewhere   Chronic hepatitis C without hepatic coma (HCC)   Metabolic acidosis   Septic pulmonary embolism (HCC)   MSSA bacteremia & tricuspid valve endocarditis: w/ septic pulmonary emboli in the setting of IV drug use in patient who left AMA after recent tricuspid valve redo. Has left AMA twice (from [facility] on 5/15 as well as [facility] on 4/30). Continue nafcillin, rifampin as recommended by ID, needs 6 total weeks (10/19/19). C. diff neg.   Normocytic anemia: s/p 2 units of PRBCs. Haptoglobin is WNL & LDH is elevated. H&H are stable. 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 No current back pain   Polysubstance abuse: w/ opioids, tobacco and benzodiazepines. Changed to po dilaudid, percocet prn. No IV pain meds currently. Continue on xanax. Nicotine patch to prevent w/drawal. Smoking cessation counseling. I saw pt put a syringe back in her purse this morning  when I was rounding. Pt initially refused searching belongings x 3 but pt finally agreed to have her belongings searched. No syringe was found. Repeat urine drug screen ordered overnight. No visitors allowed  S/p redo sternotomy intracuspid valve replacement and PFO repair: on  09/08/2019. Postop TTE on 5/10 at [facility] showed preserved EF with mild transvalvular regurgitation of prosthetic valve. CT surgery recommended aspirin daily   Nausea, abdominal discomfort: labile. Zofran prn. Scopolamine patch ordered  Chronic hepatitis C: without mental status changes.  HCV antibody positive and HCVRNA negative  Metabolic acidosis: will continue bicarb   Hypokalemia: WNL today. Will continue to monitor   Hypomagnesemia: WNL today. Will continue to monitor   Thrombocytosis: etiology unclear, possibly reactive. Will continue to monitor  Hyponatremia: Na is labile. Will continue to monitor   Leukocytosis: likely secondary to MSSA bacteremia. Continue on IV abxs   Generalized weakness: PT recs home health   DVT prophylaxis: lovenox  Code Status: full  Family Communication:  Disposition Plan: possibly d/c to SNF as pt needs 6 weeks of IV abxs as per ID. Home health will not do IV abxs for pts w/ hx of IVDA. Will likely have to stay inpatient until abxs complete. Consider doing labs every other day or every 3 days as pt is a hard stick and often refuses labs. Recommend no PICC has pt hx IVDA and leaving AMA  Status is: Inpatient  Remains inpatient appropriate because:IV treatments appropriate due to intensity of illness or inability to take PO   Dispo: The patient is from: Home              Anticipated d/c is to: SNF              Anticipated d/c date is: > 3 days              Patient currently is not medically stable to d/c.     Consultants:   nephro  ID   Procedures:    Antimicrobials: nafcillin, rifampin    Subjective: Pt denies any complaints  Objective: Vitals:    09/25/19 1551 09/25/19 1931 09/26/19 0731 09/26/19 1136  BP: 118/66 124/73 (!) 155/94 114/78  Pulse: 98 (!) 109 (!) 109 (!) 110  Resp: 16 20 15 16   Temp: 98.5 F (36.9 C) 99.2 F (37.3 C) 99.8 F (37.7 C) 98.8 F (37.1 C)  TempSrc:  Oral Oral Oral  SpO2: 97% 97% 96% 98%  Weight:      Height:        Intake/Output Summary (Last 24 hours) at 09/26/2019 1408 Last data filed at 09/26/2019 0950 Gross per 24 hour  Intake 569.42 ml  Output 300 ml  Net 269.42 ml   Filed Weights   09/24/19 0422 09/24/19 0423  Weight: 49.3 kg 49.3 kg    Examination:  General exam: Appears calm and comfortable Respiratory system: decreased breath sounds b/l. No wheezes Cardiovascular system: S1 & S2 +. No rubs, gallops or clicks.  Gastrointestinal system: Abdomen is nondistended, soft and nontender. Normal bowel sounds heard. Central nervous system: Alert and oriented. Moves all 4 extremities  Psychiatry: Judgement and insight appear normal.  Normal mood and affect     Data Reviewed: I have personally reviewed following labs and imaging studies  CBC: Recent Labs  Lab 09/20/19 0637 09/21/19 1229 09/22/19 0514 09/23/19 0854 09/25/19 1345  WBC 12.7* 12.8* 12.6* 12.2* 10.1  HGB 7.4* 7.3* 6.9* 8.2* 8.5*  HCT 21.4* 20.5* 20.5* 23.2* 24.9*  MCV 81.4 80.1 83.7 81.1 83.8  PLT 656* 579* 600* 522* 477*   Basic Metabolic Panel: Recent Labs  Lab 09/21/19 1229 09/22/19 0514 09/23/19 0741 09/25/19 1345 09/26/19 0927  NA 137 136 134* 132* 130*  K 3.4* 3.2* 3.6 3.0* 3.5  CL 107 105 104 105 102  CO2 21* 22 17* 19* 20*  GLUCOSE 101* 84 84 114* 93  BUN 16 13 12 9 7   CREATININE 1.25* 1.09* 0.95 0.93 0.86  CALCIUM 7.5* 7.4* 7.4* 7.5* 7.7*  MG 1.6* 1.7 1.7 1.6*  --    GFR: Estimated Creatinine Clearance: 64.3 mL/min (by C-G formula based on SCr of 0.86 mg/dL). Liver Function Tests: Recent Labs  Lab 09/25/19 1345  AST 9*  ALT 8  ALKPHOS 85  BILITOT 1.3*  PROT 6.9  ALBUMIN 1.7*   No  results for input(s): LIPASE, AMYLASE in the last 168 hours. No results for input(s): AMMONIA in the last 168 hours. Coagulation Profile: No results for input(s): INR, PROTIME in the last 168 hours. Cardiac Enzymes: No results for input(s): CKTOTAL, CKMB, CKMBINDEX, TROPONINI in the last 168 hours. BNP (last 3 results) No results for input(s): PROBNP in the last 8760 hours. HbA1C: No results for input(s): HGBA1C in the last 72 hours. CBG: No results for input(s): GLUCAP in the last 168 hours. Lipid Profile: No results for input(s): CHOL, HDL, LDLCALC, TRIG, CHOLHDL, LDLDIRECT in the last 72 hours. Thyroid Function Tests: No results for input(s): TSH, T4TOTAL, FREET4, T3FREE, THYROIDAB in the last 72 hours. Anemia Panel: No results for input(s): VITAMINB12, FOLATE, FERRITIN, TIBC, IRON, RETICCTPCT in the last 72 hours. Sepsis Labs: No results for input(s): PROCALCITON, LATICACIDVEN in the last 168 hours.  Recent Results (from the past 240 hour(s))  Culture, blood (routine x 2)     Status: None   Collection Time: 09/18/19 10:58 AM   Specimen: BLOOD LEFT ARM  Result Value Ref Range Status   Specimen Description BLOOD LEFT ARM  Final   Special Requests   Final    BOTTLES DRAWN AEROBIC AND ANAEROBIC Blood Culture adequate volume   Culture   Final    NO GROWTH 5 DAYS Performed at [facility], [address] [place], [place]    Report Status 09/23/2019 FINAL  Final  Culture, blood (routine x 2)     Status: None   Collection Time: 09/18/19 12:20 PM   Specimen: BLOOD  Result Value Ref Range Status   Specimen Description BLOOD RIGHT AC  Final   Special Requests   Final    BOTTLES DRAWN AEROBIC ONLY Blood Culture results may not be optimal due to an inadequate volume of blood received in culture bottles   Culture   Final    NO GROWTH 5 DAYS Performed at [facility], [address], [place], [place]    Report Status 09/23/2019 FINAL  Final    C Difficile Quick Screen w PCR reflex     Status: None   Collection Time: 09/19/19  7:01 AM   Specimen: STOOL  Result Value Ref Range Status   C Diff antigen NEGATIVE NEGATIVE Final   C  Diff toxin NEGATIVE NEGATIVE Final   C Diff interpretation No C. difficile detected.  Final    Comment: Performed at [facility], [address] [place], [place] [address]         Radiology Studies: No results found.      Scheduled Meds: . sodium chloride   Intravenous Once  . sodium chloride   Intravenous Once  . ALPRAZolam  1 mg Oral TID  . aspirin EC  81 mg Oral Daily  . Chlorhexidine Gluconate Cloth  6 each Topical Daily  . feeding supplement (ENSURE ENLIVE)  237 mL Oral TID BM  . melatonin  2.5 mg Oral QHS  . nicotine  21 mg Transdermal Daily  . potassium chloride  20 mEq Oral Daily  . rifampin  300 mg Oral Q8H  . scopolamine  1 patch Transdermal Q72H  . sodium bicarbonate  650 mg Oral Daily   Continuous Infusions: . nafcillin (NAFCIL) continuous infusion Stopped (09/26/19 0202)     LOS: 8 days    Time spent: 35 mins     Wyvonnia Dusky, MD [facility] Pager [phone]  If 7PM-7AM, please contact night-coverage [email or web address] 09/26/2019, 2:08 PM

## 2019-09-26 NOTE — Progress Notes (Signed)
Patient refusing care and VS from NT. This nurse advises patient of importance of complying with plan of care. This nurse obtains VS at this time.

## 2019-09-26 NOTE — Progress Notes (Signed)
Discussed patient with NP Manuela Schwartz. HR sustaining around 120 throughout the night. Educate patient about caffeine intake. Patient frequently trying to unplug or remove tele monitoring. I have educated patient about why she is on medications and cardiac monitoring. Her response it consistently "I don't care". NP Jon Billings ordered CBG and urine and ethanol tests, Patient is refusing all care including vitals currently EXCEPT for medications. I have been in patient's room at least hourly throughout shift. Requests pain meds, nausea meds, cups of ice, milk at each interaction. Refuses to have any cups etc removed from room.

## 2019-09-28 ENCOUNTER — Telehealth: Payer: Self-pay | Admitting: General Practice

## 2019-09-28 NOTE — Telephone Encounter (Signed)
Reached out to go over eligibility and application after received a referral from [facility]. Call could not be completed

## 2019-10-04 NOTE — Discharge Summary (Signed)
Physician Discharge Summary  Andrea King ZOX:[id number] DOB: 12-24-1975 DOA: 09/18/2019  PCP: [name], CNM  Admit date: 09/18/2019 Discharge date: 10/04/2019  Admitted From: home Disposition:  Pt left AMA   Recommendations for Outpatient Follow-up:  1. Pt left AMA   Home Health: no  Equipment/Devices:  Discharge Condition: guarded CODE STATUS: full  Diet recommendation: Heart Healthy   Brief/Interim Summary: HPI was taken from Dr. Francine Graven: Andrea Confer Troxleris a 44 y.o.femalewith medical history significant forprior TV endocarditis s/p replacement &PFO closure in 2017, multiple polymicrobial bacteremias in 2019 and 2020 including MRSA, Enterococcus, Serratia, recently (08/06/19) who presented with fever and severe back pain with imaging confirming osteomyelitisof the lumbar and sacral spine, likely scattered abscesses, cavitary lung lesions consistent with septic emboli as well as TEE with mobile echodensity (12x69mm) adherent to RA interatrial septum with mild MR. She left AMA 4/6, then re-presented to [facility] 4/28 with fever, malaise, and TTE confirming 1.2x0.5cm TV veg with moderate TR prompting transfer to [facility] September 01, 2019 for consideration of surgical valvular repair for tricuspid vegetations with endocarditis secondary to MSSA bacteremia. Patient is status post redo Sternotomy,Tricupsid valve replacement, Repair PFOon 09/08/19. Post op TTE completed 5/10 with EF 50-55%, mild transvalvular regurgitation of prosthetic valve. Patient was seen by infectious disease 5/7 OR cultures of heart valve - NGTD 5/11 repeat blood cultures x2 drawn on 05/11 Recommend tocontinue nafcillin (anticipate 6 week course of therapy), end date TBD Continue gentamicin (plan is for 2 weeks tentatively with end date of 5/17) Continue rifampin po tid Repeat MRI C spine to eval for osteo, abscesses per ID however patient has wires that will be cut - Negative c diff on  05/04 Patient signed out from [facility] on05/16/21and was given a prescription for Augmentin 875 mg p.o. twice daily (for 28 days, end date06/13/21).Patient did not fill the prescription and was brought into the emergency room by her friend because he states that she was not acting right. Patient was noted to be lethargic in triage and not responding to verbal stimuli. She also had pinpoint pupils and her friend stated that she had used heroin. The ER nurse was going to give Narcan but patient declined. She is currently awake and alert. Labs reveal a serum creatinine of 2.0 above her baseline of 1.27. Potassium of 3 and hemoglobin of 6.5g/dl. ED Course:44 year old female status post recent tricuspid valve replacement and PFO repair at [facility] for MSSA endocarditis who signed out [place] from [facility] and was brought into the emergency room for evaluation of altered mental status. Patient will be admitted to complete her IV antibiotic therapy. She received a dose of gentamicin in the ER.Patient to be admitted for further evaluation.    Pt evidently decided to leave AMA on the night of 09/26/19. Please see below A&P for the week I saw this pt. Pt has previously left AMA several times from [facility] as well as [facility]. Please see previous progress notes for more information.    Discharge Diagnoses:  Principal Problem:   Endocarditis of tricuspid valve Active Problems:   Opiate abuse, continuous (HCC)   AKI (acute kidney injury) ([place])   Hypokalemia   Anemia in other chronic diseases classified elsewhere   Chronic hepatitis C without hepatic coma (HCC)   Metabolic acidosis   Septic pulmonary embolism (HCC)   MSSA bacteremia & tricuspid valve endocarditis: w/ septic pulmonary emboli in the setting of IV drug useinpatient wholeft AMA after recent  tricuspid valve redo. Has left AMA twice (from [facility] on 5/15 as well as [facility] on 4/30). Continue nafcillin, rifampin  as recommended by ID, needs 6 total weeks (10/19/19). C. diff neg.   Normocytic anemia: s/p 2 units of PRBCs. Haptoglobin is WNL & LDH is elevated. H&H are stable. 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 No current back pain   Polysubstance abuse: w/ opioids, tobacco and benzodiazepines. Changed to po dilaudid, percocet prn. No IV pain meds currently. Continue on xanax. Nicotine patch to prevent w/drawal. Smoking cessation counseling. I saw pt put a syringe back in her purse this morning when I was rounding. Pt initially refused searching belongings x 3 but pt finally agreed to have her belongings searched. No syringe was found. Repeat urine drug screen ordered overnight. No visitors allowed  S/p redo sternotomy intracuspid valve replacement and PFO repair: on  09/08/2019. Postop TTE on 5/10 at outside hospital showed preserved EF with mild transvalvular regurgitation of prosthetic valve. CT surgery recommended aspirin daily  Nausea, abdominal discomfort: labile. Zofran prn. Scopolamine patch ordered  Chronic hepatitis C: without mental status changes. HCV antibody positive and HCVRNA negative  Metabolic acidosis: will continue bicarb   Hypokalemia: WNL today. Will continue to monitor   Hypomagnesemia: WNL today. Will continue to monitor   Thrombocytosis: etiology unclear, possibly reactive. Will continue to monitor  Hyponatremia: Na is labile. Will continue to monitor   Leukocytosis: likely secondary to MSSA bacteremia. Continue on IV abxs   Generalized weakness: PT recs home health   Discharge Instructions    No Known Allergies  Consultations:  ID  nephro    Procedures/Studies: DG Chest Port 1 View  Result Date: 09/18/2019 CLINICAL DATA:  Recent valve replacement EXAM: PORTABLE CHEST 1 VIEW COMPARISON:  08/30/2019 FINDINGS: Bilateral patchy interstitial and alveolar airspace  opacities. No pleural effusion or pneumothorax. Stable cardiomediastinal silhouette. Interval valvuloplasty. Prior median sternotomy. Prior partially visualized anterior and posterior cervical fusion. IMPRESSION: Bilateral patchy interstitial and alveolar airspace opacities consistent with patient's known history of septic emboli. No significant interval change compared with 08/30/2019. Electronically Signed   By: Elige Ko   On: 09/18/2019 11:36      Subjective: Pt denies any complaints   Discharge Exam: Vitals:   09/26/19 2008 09/26/19 2149  BP: 117/75 114/67  Pulse: (!) 120 (!) 123  Resp:    Temp: 99 F (37.2 C) 99 F (37.2 C)  SpO2: 98% 98%   Vitals:   09/26/19 1136 09/26/19 1545 09/26/19 2008 09/26/19 2149  BP: 114/78 123/76 117/75 114/67  Pulse: (!) 110 98 (!) 120 (!) 123  Resp: 16 15    Temp: 98.8 F (37.1 C) 98.7 F (37.1 C) 99 F (37.2 C) 99 F (37.2 C)  TempSrc: Oral  Oral Oral  SpO2: 98% 99% 98% 98%  Weight:      Height:        General exam: Appears calm and comfortable Respiratory system: decreased breath sounds b/l. No wheezes Cardiovascular system: S1 & S2 +. No rubs, gallops or clicks.  Gastrointestinal system: Abdomen is nondistended, soft and nontender. Normal bowel sounds heard. Central nervous system: Alert and oriented. Moves all 4 extremities  Psychiatry: Judgement and insight appear normal. Normal mood and affect     The results of significant diagnostics from this hospitalization (including imaging, microbiology, ancillary and laboratory) are listed below for reference.     Microbiology: No results  found for this or any previous visit (from the past 240 hour(s)).   Labs: BNP (last 3 results) Recent Labs    09/18/19 1058  BNP 329.0*   Basic Metabolic Panel: No results for input(s): NA, K, CL, CO2, GLUCOSE, BUN, CREATININE, CALCIUM, MG, PHOS in the last 168 hours. Liver Function Tests: No results for input(s): AST, ALT, ALKPHOS,  BILITOT, PROT, ALBUMIN in the last 168 hours. No results for input(s): LIPASE, AMYLASE in the last 168 hours. No results for input(s): AMMONIA in the last 168 hours. CBC: No results for input(s): WBC, NEUTROABS, HGB, HCT, MCV, PLT in the last 168 hours. Cardiac Enzymes: No results for input(s): CKTOTAL, CKMB, CKMBINDEX, TROPONINI in the last 168 hours. BNP: Invalid input(s): POCBNP CBG: No results for input(s): GLUCAP in the last 168 hours. D-Dimer No results for input(s): DDIMER in the last 72 hours. Hgb A1c No results for input(s): HGBA1C in the last 72 hours. Lipid Profile No results for input(s): CHOL, HDL, LDLCALC, TRIG, CHOLHDL, LDLDIRECT in the last 72 hours. Thyroid function studies No results for input(s): TSH, T4TOTAL, T3FREE, THYROIDAB in the last 72 hours.  Invalid input(s): FREET3 Anemia work up No results for input(s): VITAMINB12, FOLATE, FERRITIN, TIBC, IRON, RETICCTPCT in the last 72 hours. Urinalysis    Component Value Date/Time   COLORURINE STRAW (A) 09/18/2019 2240   APPEARANCEUR CLEAR (A) 09/18/2019 2240   LABSPEC 1.008 09/18/2019 2240   PHURINE 5.0 09/18/2019 2240   GLUCOSEU NEGATIVE 09/18/2019 2240   HGBUR LARGE (A) 09/18/2019 2240   BILIRUBINUR NEGATIVE 09/18/2019 2240   KETONESUR NEGATIVE 09/18/2019 2240   PROTEINUR 30 (A) 09/18/2019 2240   UROBILINOGEN 0.2 04/27/2013 2330   NITRITE NEGATIVE 09/18/2019 2240   LEUKOCYTESUR TRACE (A) 09/18/2019 2240   Sepsis Labs Invalid input(s): PROCALCITONIN,  WBC,  LACTICIDVEN Microbiology No results found for this or any previous visit (from the past 240 hour(s)).   Time coordinating discharge: Over 30 minutes  SIGNED:   Charise Killian, MD  [facility] 10/04/2019, 4:50 PM Pager   If 7PM-7AM, please contact night-coverage [email or web address]

## 2019-10-11 ENCOUNTER — Telehealth: Payer: Self-pay | Admitting: General Practice

## 2019-10-11 NOTE — Telephone Encounter (Signed)
LVM regarding how to become a pt here in the clinic.

## 2019-10-18 ENCOUNTER — Telehealth: Payer: Self-pay | Admitting: Advanced Practice Midwife

## 2019-10-18 ENCOUNTER — Telehealth: Payer: Self-pay | Admitting: General Practice

## 2019-10-18 NOTE — Telephone Encounter (Signed)
LVM regarding establishing care in the clinic.

## 2019-10-19 ENCOUNTER — Telehealth: Payer: Self-pay | Admitting: General Practice

## 2019-10-19 NOTE — Telephone Encounter (Signed)
LVM attempting to establish PCP.

## 2019-11-01 NOTE — Telephone Encounter (Signed)
Called 3+ times therefore marked completed

## 2019-11-08 ENCOUNTER — Ambulatory Visit: Payer: Medicaid Other | Admitting: Gerontology

## 2020-08-16 ENCOUNTER — Other Ambulatory Visit: Payer: Self-pay

## 2020-08-16 ENCOUNTER — Encounter (HOSPITAL_COMMUNITY): Payer: Self-pay

## 2020-08-16 ENCOUNTER — Emergency Department (HOSPITAL_COMMUNITY)
Admission: EM | Admit: 2020-08-16 | Discharge: 2020-08-16 | Disposition: A | Discharge status: Home / Self Care | Payer: Medicaid Other | Attending: Emergency Medicine | Admitting: Emergency Medicine

## 2020-08-16 DIAGNOSIS — Z5321 Procedure and treatment not carried out due to patient leaving prior to being seen by health care provider: Secondary | ICD-10-CM | POA: Insufficient documentation

## 2020-08-16 DIAGNOSIS — M542 Cervicalgia: Secondary | ICD-10-CM | POA: Diagnosis not present

## 2020-08-16 NOTE — ED Triage Notes (Signed)
Patient arrives with [facility] after she was pulled over by PD and her car was searched, PD reports patient appeared to have a panic attack, patient reports she is here for neck pain. Patient uncooperative with triage.

## 2020-08-16 NOTE — ED Notes (Signed)
Pt name called 3 different times, no response. Pt not in bathroom or outside.

## 2020-08-16 NOTE — ED Provider Notes (Signed)
MSE was initiated and I personally evaluated the patient and placed orders (if any) at  3:21 AM on August 16, 2020.  Patient to ED for neck pain x 1 year since she broke her neck. No new injury. Brought by EMS, called by [employer] after she had what appeared to be a panic attack after being pulled over and her car searched. Patient only reports neck pain.   Well healed midline surgical scars over cervical spine. No swelling or redness.  FROM all extremities.  The patient appears stable so that the remainder of the MSE may be completed by another provider.   Elpidio Anis, PA-C 08/16/20 0325    Zadie Rhine, MD 08/16/20 2326

## 2020-09-23 ENCOUNTER — Encounter: Payer: Self-pay | Admitting: Emergency Medicine

## 2020-09-23 ENCOUNTER — Inpatient Hospital Stay
Admission: EM | Admit: 2020-09-23 | Discharge: 2020-09-29 | DRG: 870 | Discharge status: Against Medical Advice | Payer: Medicaid Other | Attending: Pulmonary Disease | Admitting: Pulmonary Disease

## 2020-09-23 ENCOUNTER — Inpatient Hospital Stay: Payer: Medicaid Other

## 2020-09-23 ENCOUNTER — Emergency Department: Payer: Medicaid Other

## 2020-09-23 ENCOUNTER — Other Ambulatory Visit: Payer: Self-pay

## 2020-09-23 DIAGNOSIS — R1084 Generalized abdominal pain: Secondary | ICD-10-CM | POA: Diagnosis present

## 2020-09-23 DIAGNOSIS — G928 Other toxic encephalopathy: Secondary | ICD-10-CM | POA: Diagnosis present

## 2020-09-23 DIAGNOSIS — G0490 Encephalitis and encephalomyelitis, unspecified: Secondary | ICD-10-CM | POA: Diagnosis present

## 2020-09-23 DIAGNOSIS — E872 Acidosis: Secondary | ICD-10-CM | POA: Diagnosis present

## 2020-09-23 DIAGNOSIS — F1110 Opioid abuse, uncomplicated: Secondary | ICD-10-CM | POA: Diagnosis present

## 2020-09-23 DIAGNOSIS — I509 Heart failure, unspecified: Secondary | ICD-10-CM | POA: Diagnosis present

## 2020-09-23 DIAGNOSIS — Z515 Encounter for palliative care: Secondary | ICD-10-CM | POA: Diagnosis not present

## 2020-09-23 DIAGNOSIS — Z01818 Encounter for other preprocedural examination: Secondary | ICD-10-CM

## 2020-09-23 DIAGNOSIS — Z638 Other specified problems related to primary support group: Secondary | ICD-10-CM

## 2020-09-23 DIAGNOSIS — A4101 Sepsis due to Methicillin susceptible Staphylococcus aureus: Principal | ICD-10-CM | POA: Diagnosis present

## 2020-09-23 DIAGNOSIS — E876 Hypokalemia: Secondary | ICD-10-CM | POA: Diagnosis not present

## 2020-09-23 DIAGNOSIS — K7460 Unspecified cirrhosis of liver: Secondary | ICD-10-CM

## 2020-09-23 DIAGNOSIS — F1410 Cocaine abuse, uncomplicated: Secondary | ICD-10-CM | POA: Diagnosis present

## 2020-09-23 DIAGNOSIS — K828 Other specified diseases of gallbladder: Secondary | ICD-10-CM | POA: Diagnosis present

## 2020-09-23 DIAGNOSIS — J9601 Acute respiratory failure with hypoxia: Secondary | ICD-10-CM | POA: Diagnosis present

## 2020-09-23 DIAGNOSIS — J189 Pneumonia, unspecified organism: Secondary | ICD-10-CM | POA: Diagnosis not present

## 2020-09-23 DIAGNOSIS — Z86711 Personal history of pulmonary embolism: Secondary | ICD-10-CM

## 2020-09-23 DIAGNOSIS — Z8619 Personal history of other infectious and parasitic diseases: Secondary | ICD-10-CM | POA: Diagnosis not present

## 2020-09-23 DIAGNOSIS — G9389 Other specified disorders of brain: Secondary | ICD-10-CM | POA: Diagnosis present

## 2020-09-23 DIAGNOSIS — K766 Portal hypertension: Secondary | ICD-10-CM | POA: Diagnosis present

## 2020-09-23 DIAGNOSIS — R4020 Unspecified coma: Secondary | ICD-10-CM | POA: Diagnosis not present

## 2020-09-23 DIAGNOSIS — D638 Anemia in other chronic diseases classified elsewhere: Secondary | ICD-10-CM | POA: Diagnosis not present

## 2020-09-23 DIAGNOSIS — I079 Rheumatic tricuspid valve disease, unspecified: Secondary | ICD-10-CM | POA: Diagnosis present

## 2020-09-23 DIAGNOSIS — M1990 Unspecified osteoarthritis, unspecified site: Secondary | ICD-10-CM | POA: Diagnosis present

## 2020-09-23 DIAGNOSIS — T82528A Displacement of other cardiac and vascular devices and implants, initial encounter: Secondary | ICD-10-CM

## 2020-09-23 DIAGNOSIS — A419 Sepsis, unspecified organism: Secondary | ICD-10-CM | POA: Diagnosis not present

## 2020-09-23 DIAGNOSIS — R6521 Severe sepsis with septic shock: Secondary | ICD-10-CM | POA: Diagnosis present

## 2020-09-23 DIAGNOSIS — F17210 Nicotine dependence, cigarettes, uncomplicated: Secondary | ICD-10-CM | POA: Diagnosis present

## 2020-09-23 DIAGNOSIS — B182 Chronic viral hepatitis C: Secondary | ICD-10-CM | POA: Diagnosis not present

## 2020-09-23 DIAGNOSIS — Z20822 Contact with and (suspected) exposure to covid-19: Secondary | ICD-10-CM | POA: Diagnosis present

## 2020-09-23 DIAGNOSIS — R112 Nausea with vomiting, unspecified: Secondary | ICD-10-CM | POA: Diagnosis not present

## 2020-09-23 DIAGNOSIS — R109 Unspecified abdominal pain: Secondary | ICD-10-CM

## 2020-09-23 DIAGNOSIS — R4182 Altered mental status, unspecified: Secondary | ICD-10-CM

## 2020-09-23 DIAGNOSIS — T826XXA Infection and inflammatory reaction due to cardiac valve prosthesis, initial encounter: Secondary | ICD-10-CM | POA: Diagnosis not present

## 2020-09-23 DIAGNOSIS — Z4659 Encounter for fitting and adjustment of other gastrointestinal appliance and device: Secondary | ICD-10-CM

## 2020-09-23 DIAGNOSIS — K652 Spontaneous bacterial peritonitis: Secondary | ICD-10-CM | POA: Diagnosis present

## 2020-09-23 DIAGNOSIS — Z79899 Other long term (current) drug therapy: Secondary | ICD-10-CM

## 2020-09-23 DIAGNOSIS — J45901 Unspecified asthma with (acute) exacerbation: Secondary | ICD-10-CM | POA: Diagnosis present

## 2020-09-23 DIAGNOSIS — R1110 Vomiting, unspecified: Secondary | ICD-10-CM

## 2020-09-23 DIAGNOSIS — Z8774 Personal history of (corrected) congenital malformations of heart and circulatory system: Secondary | ICD-10-CM

## 2020-09-23 DIAGNOSIS — I330 Acute and subacute infective endocarditis: Secondary | ICD-10-CM | POA: Diagnosis not present

## 2020-09-23 DIAGNOSIS — F1910 Other psychoactive substance abuse, uncomplicated: Secondary | ICD-10-CM | POA: Diagnosis not present

## 2020-09-23 DIAGNOSIS — G9340 Encephalopathy, unspecified: Secondary | ICD-10-CM | POA: Diagnosis present

## 2020-09-23 DIAGNOSIS — J9602 Acute respiratory failure with hypercapnia: Secondary | ICD-10-CM | POA: Diagnosis present

## 2020-09-23 DIAGNOSIS — Z7982 Long term (current) use of aspirin: Secondary | ICD-10-CM

## 2020-09-23 DIAGNOSIS — Z7189 Other specified counseling: Secondary | ICD-10-CM | POA: Diagnosis not present

## 2020-09-23 DIAGNOSIS — Z62890 Parent-child estrangement NEC: Secondary | ICD-10-CM | POA: Diagnosis present

## 2020-09-23 DIAGNOSIS — E43 Unspecified severe protein-calorie malnutrition: Secondary | ICD-10-CM | POA: Diagnosis not present

## 2020-09-23 DIAGNOSIS — I38 Endocarditis, valve unspecified: Secondary | ICD-10-CM | POA: Diagnosis not present

## 2020-09-23 DIAGNOSIS — R509 Fever, unspecified: Secondary | ICD-10-CM

## 2020-09-23 LAB — CBC WITH DIFFERENTIAL/PLATELET
Abs Immature Granulocytes: 0.21 10*3/uL — ABNORMAL HIGH (ref 0.00–0.07)
Abs Immature Granulocytes: 0.21 10*3/uL — ABNORMAL HIGH (ref 0.00–0.07)
Basophils Absolute: 0 10*3/uL (ref 0.0–0.1)
Basophils Absolute: 0 10*3/uL (ref 0.0–0.1)
Basophils Relative: 0 %
Basophils Relative: 0 %
Eosinophils Absolute: 0 10*3/uL (ref 0.0–0.5)
Eosinophils Absolute: 0 10*3/uL (ref 0.0–0.5)
Eosinophils Relative: 0 %
Eosinophils Relative: 0 %
HCT: 23.8 % — ABNORMAL LOW (ref 36.0–46.0)
HCT: 27.8 % — ABNORMAL LOW (ref 36.0–46.0)
Hemoglobin: 7.8 g/dL — ABNORMAL LOW (ref 12.0–15.0)
Hemoglobin: 9.1 g/dL — ABNORMAL LOW (ref 12.0–15.0)
Immature Granulocytes: 1 %
Immature Granulocytes: 1 %
Lymphocytes Relative: 3 %
Lymphocytes Relative: 3 %
Lymphs Abs: 0.6 10*3/uL — ABNORMAL LOW (ref 0.7–4.0)
Lymphs Abs: 0.5 10*3/uL — ABNORMAL LOW (ref 0.7–4.0)
MCH: 26.7 pg (ref 26.0–34.0)
MCH: 25.9 pg — ABNORMAL LOW (ref 26.0–34.0)
MCHC: 32.8 g/dL (ref 30.0–36.0)
MCHC: 32.7 g/dL (ref 30.0–36.0)
MCV: 81.5 fL (ref 80.0–100.0)
MCV: 79 fL — ABNORMAL LOW (ref 80.0–100.0)
Monocytes Absolute: 0.3 10*3/uL (ref 0.1–1.0)
Monocytes Absolute: 0.6 10*3/uL (ref 0.1–1.0)
Monocytes Relative: 2 %
Monocytes Relative: 3 %
Neutro Abs: 15.5 10*3/uL — ABNORMAL HIGH (ref 1.7–7.7)
Neutro Abs: 15.5 10*3/uL — ABNORMAL HIGH (ref 1.7–7.7)
Neutrophils Relative %: 94 %
Neutrophils Relative %: 93 %
Platelets: 137 10*3/uL — ABNORMAL LOW (ref 150–400)
Platelets: 141 10*3/uL — ABNORMAL LOW (ref 150–400)
RBC: 2.92 MIL/uL — ABNORMAL LOW (ref 3.87–5.11)
RBC: 3.52 MIL/uL — ABNORMAL LOW (ref 3.87–5.11)
RDW: 14.8 % (ref 11.5–15.5)
RDW: 14.6 % (ref 11.5–15.5)
WBC: 16.6 10*3/uL — ABNORMAL HIGH (ref 4.0–10.5)
WBC: 16.7 10*3/uL — ABNORMAL HIGH (ref 4.0–10.5)
nRBC: 0 % (ref 0.0–0.2)
nRBC: 0 % (ref 0.0–0.2)

## 2020-09-23 LAB — CBC
HCT: 26.6 % — ABNORMAL LOW (ref 36.0–46.0)
HCT: 30.9 % — ABNORMAL LOW (ref 36.0–46.0)
Hemoglobin: 8.8 g/dL — ABNORMAL LOW (ref 12.0–15.0)
Hemoglobin: 9.9 g/dL — ABNORMAL LOW (ref 12.0–15.0)
MCH: 25.6 pg — ABNORMAL LOW (ref 26.0–34.0)
MCH: 26.5 pg (ref 26.0–34.0)
MCHC: 32 g/dL (ref 30.0–36.0)
MCHC: 33.1 g/dL (ref 30.0–36.0)
MCV: 79.8 fL — ABNORMAL LOW (ref 80.0–100.0)
MCV: 80.1 fL (ref 80.0–100.0)
Platelets: 111 10*3/uL — ABNORMAL LOW (ref 150–400)
Platelets: 190 10*3/uL (ref 150–400)
RBC: 3.32 MIL/uL — ABNORMAL LOW (ref 3.87–5.11)
RBC: 3.87 MIL/uL (ref 3.87–5.11)
RDW: 14.6 % (ref 11.5–15.5)
RDW: 14.8 % (ref 11.5–15.5)
WBC: 14.8 10*3/uL — ABNORMAL HIGH (ref 4.0–10.5)
WBC: 23.2 10*3/uL — ABNORMAL HIGH (ref 4.0–10.5)
nRBC: 0 % (ref 0.0–0.2)
nRBC: 0.1 % (ref 0.0–0.2)

## 2020-09-23 LAB — URINALYSIS, COMPLETE (UACMP) WITH MICROSCOPIC
Bacteria, UA: NONE SEEN
Bilirubin Urine: NEGATIVE
Glucose, UA: NEGATIVE mg/dL
Ketones, ur: NEGATIVE mg/dL
Leukocytes,Ua: NEGATIVE
Nitrite: NEGATIVE
Protein, ur: 30 mg/dL — AB
Specific Gravity, Urine: 1.012 (ref 1.005–1.030)
pH: 9 — ABNORMAL HIGH (ref 5.0–8.0)

## 2020-09-23 LAB — COMPREHENSIVE METABOLIC PANEL
ALT: 13 U/L (ref 0–44)
ALT: 12 U/L (ref 0–44)
ALT: 10 U/L (ref 0–44)
AST: 22 U/L (ref 15–41)
AST: 27 U/L (ref 15–41)
AST: 23 U/L (ref 15–41)
Albumin: 3.2 g/dL — ABNORMAL LOW (ref 3.5–5.0)
Albumin: 2.7 g/dL — ABNORMAL LOW (ref 3.5–5.0)
Albumin: 2.7 g/dL — ABNORMAL LOW (ref 3.5–5.0)
Alkaline Phosphatase: 109 U/L (ref 38–126)
Alkaline Phosphatase: 96 U/L (ref 38–126)
Alkaline Phosphatase: 85 U/L (ref 38–126)
Anion gap: 11 (ref 5–15)
Anion gap: 13 (ref 5–15)
Anion gap: 11 (ref 5–15)
BUN: 11 mg/dL (ref 6–20)
BUN: 14 mg/dL (ref 6–20)
BUN: 16 mg/dL (ref 6–20)
CO2: 24 mmol/L (ref 22–32)
CO2: 22 mmol/L (ref 22–32)
CO2: 25 mmol/L (ref 22–32)
Calcium: 8.8 mg/dL — ABNORMAL LOW (ref 8.9–10.3)
Calcium: 8.1 mg/dL — ABNORMAL LOW (ref 8.9–10.3)
Calcium: 7.8 mg/dL — ABNORMAL LOW (ref 8.9–10.3)
Chloride: 100 mmol/L (ref 98–111)
Chloride: 99 mmol/L (ref 98–111)
Chloride: 100 mmol/L (ref 98–111)
Creatinine, Ser: 0.77 mg/dL (ref 0.44–1.00)
Creatinine, Ser: 0.94 mg/dL (ref 0.44–1.00)
Creatinine, Ser: 0.82 mg/dL (ref 0.44–1.00)
GFR, Estimated: >60 mL/min (ref >60)
GFR, Estimated: >60 mL/min (ref >60)
GFR, Estimated: >60 mL/min (ref >60)
Glucose, Bld: 130 mg/dL — ABNORMAL HIGH (ref 70–99)
Glucose, Bld: 142 mg/dL — ABNORMAL HIGH (ref 70–99)
Glucose, Bld: 135 mg/dL — ABNORMAL HIGH (ref 70–99)
Potassium: 3.8 mmol/L (ref 3.5–5.1)
Potassium: 3.4 mmol/L — ABNORMAL LOW (ref 3.5–5.1)
Potassium: 3.4 mmol/L — ABNORMAL LOW (ref 3.5–5.1)
Sodium: 135 mmol/L (ref 135–145)
Sodium: 134 mmol/L — ABNORMAL LOW (ref 135–145)
Sodium: 136 mmol/L (ref 135–145)
Total Bilirubin: 0.8 mg/dL (ref 0.3–1.2)
Total Bilirubin: 0.6 mg/dL (ref 0.3–1.2)
Total Bilirubin: 0.5 mg/dL (ref 0.3–1.2)
Total Protein: 7.9 g/dL (ref 6.5–8.1)
Total Protein: 7 g/dL (ref 6.5–8.1)
Total Protein: 6.7 g/dL (ref 6.5–8.1)

## 2020-09-23 LAB — MRSA PCR SCREENING: MRSA by PCR: NEGATIVE

## 2020-09-23 LAB — APTT
aPTT: 40 seconds — ABNORMAL HIGH (ref 24–36)
aPTT: 39 seconds — ABNORMAL HIGH (ref 24–36)

## 2020-09-23 LAB — PROTIME-INR
INR: 2.5 — ABNORMAL HIGH (ref 0.8–1.2)
INR: 1.3 — ABNORMAL HIGH (ref 0.8–1.2)
Prothrombin Time: 27.2 seconds — ABNORMAL HIGH (ref 11.4–15.2)
Prothrombin Time: 15.9 seconds — ABNORMAL HIGH (ref 11.4–15.2)

## 2020-09-23 LAB — URINE DRUG SCREEN, QUALITATIVE (ARMC ONLY)
Amphetamines, Ur Screen: NOT DETECTED
Barbiturates, Ur Screen: NOT DETECTED
Benzodiazepine, Ur Scrn: NOT DETECTED
Cannabinoid 50 Ng, Ur ~~LOC~~: NOT DETECTED
Cocaine Metabolite,Ur ~~LOC~~: NOT DETECTED
MDMA (Ecstasy)Ur Screen: NOT DETECTED
Methadone Scn, Ur: NOT DETECTED
Opiate, Ur Screen: NOT DETECTED
Phencyclidine (PCP) Ur S: NOT DETECTED
Tricyclic, Ur Screen: NOT DETECTED

## 2020-09-23 LAB — LACTIC ACID, PLASMA
Lactic Acid, Venous: 1 mmol/L (ref 0.5–1.9)
Lactic Acid, Venous: 3.4 mmol/L (ref 0.5–1.9)
Lactic Acid, Venous: 2 mmol/L (ref 0.5–1.9)

## 2020-09-23 LAB — RESP PANEL BY RT-PCR (FLU A&B, COVID) ARPGX2
Influenza A by PCR: NEGATIVE
Influenza B by PCR: NEGATIVE
SARS Coronavirus 2 by RT PCR: NEGATIVE

## 2020-09-23 LAB — MAGNESIUM: Magnesium: 1.5 mg/dL — ABNORMAL LOW (ref 1.7–2.4)

## 2020-09-23 LAB — LIPASE, BLOOD: Lipase: 25 U/L (ref 11–51)

## 2020-09-23 LAB — AMMONIA: Ammonia: 26 umol/L (ref 9–35)

## 2020-09-23 LAB — PHOSPHORUS: Phosphorus: 3.1 mg/dL (ref 2.5–4.6)

## 2020-09-23 LAB — PROCALCITONIN: Procalcitonin: 38.81 ng/mL

## 2020-09-23 LAB — HIV ANTIBODY (ROUTINE TESTING W REFLEX): HIV Screen 4th Generation wRfx: NONREACTIVE

## 2020-09-23 LAB — POC URINE PREG, ED: Preg Test, Ur: NEGATIVE

## 2020-09-23 MED ORDER — CHLORHEXIDINE GLUCONATE CLOTH 2 % EX PADS
6.0000 | MEDICATED_PAD | Freq: Every day | CUTANEOUS | Status: DC
  Administered 2020-09-23 – 2020-09-26 (×3): 6 via TOPICAL

## 2020-09-23 MED ORDER — LORAZEPAM 2 MG/ML IJ SOLN: 2.0000 mg | Freq: Once | INTRAMUSCULAR | Status: DC

## 2020-09-23 MED ORDER — ACETAMINOPHEN 650 MG RE SUPP: 650.0000 mg | Freq: Four times a day (QID) | RECTAL | Status: DC | PRN

## 2020-09-23 MED ORDER — LORAZEPAM 2 MG/ML IJ SOLN
1.0000 mg | INTRAMUSCULAR | Status: DC | PRN
  Administered 2020-09-23: 1 mg via INTRAVENOUS
  Administered 2020-09-23 (×2): 2 mg via INTRAVENOUS
  Filled 2020-09-23 (×3): qty 1

## 2020-09-23 MED ORDER — KETOROLAC TROMETHAMINE 30 MG/ML IJ SOLN
15.0000 mg | Freq: Once | INTRAMUSCULAR | Status: AC
  Administered 2020-09-23: 15 mg via INTRAVENOUS
  Filled 2020-09-23: qty 1

## 2020-09-23 MED ORDER — PIPERACILLIN-TAZOBACTAM 3.375 G IVPB 30 MIN: 3.3750 g | Freq: Once | INTRAVENOUS | Status: DC

## 2020-09-23 MED ORDER — HALOPERIDOL LACTATE 5 MG/ML IJ SOLN
2.0000 mg | Freq: Once | INTRAMUSCULAR | Status: AC
  Administered 2020-09-23: 2 mg via INTRAVENOUS
  Filled 2020-09-23: qty 1

## 2020-09-23 MED ORDER — PANTOPRAZOLE SODIUM 40 MG IV SOLR
40.0000 mg | Freq: Two times a day (BID) | INTRAVENOUS | Status: DC
  Administered 2020-09-23 – 2020-09-29 (×13): 40 mg via INTRAVENOUS
  Filled 2020-09-23 (×13): qty 40

## 2020-09-23 MED ORDER — THIAMINE HCL 100 MG/ML IJ SOLN
100.0000 mg | Freq: Every day | INTRAMUSCULAR | Status: DC
  Administered 2020-09-23 – 2020-09-24 (×2): 100 mg via INTRAVENOUS
  Filled 2020-09-23 (×2): qty 2

## 2020-09-23 MED ORDER — ONDANSETRON HCL 4 MG/2ML IJ SOLN
4.0000 mg | Freq: Once | INTRAMUSCULAR | Status: AC
  Administered 2020-09-23: 4 mg via INTRAVENOUS
  Filled 2020-09-23: qty 2

## 2020-09-23 MED ORDER — PANTOPRAZOLE SODIUM 40 MG IV SOLR: 40.0000 mg | Freq: Two times a day (BID) | INTRAVENOUS | Status: DC

## 2020-09-23 MED ORDER — LORAZEPAM 2 MG/ML IJ SOLN
1.0000 mg | Freq: Once | INTRAMUSCULAR | Status: AC
  Administered 2020-09-23: 1 mg via INTRAVENOUS

## 2020-09-23 MED ORDER — TRAZODONE HCL 50 MG PO TABS: 25.0000 mg | ORAL_TABLET | Freq: Every evening | ORAL | Status: DC | PRN

## 2020-09-23 MED ORDER — SODIUM CHLORIDE 0.9 % IV SOLN: INTRAVENOUS | Status: DC

## 2020-09-23 MED ORDER — ADULT MULTIVITAMIN W/MINERALS CH
1.0000 | ORAL_TABLET | Freq: Every day | ORAL | Status: DC
  Administered 2020-09-25: 1 via ORAL
  Filled 2020-09-23: qty 1

## 2020-09-23 MED ORDER — ONDANSETRON HCL 4 MG/2ML IJ SOLN: 4.0000 mg | Freq: Four times a day (QID) | INTRAMUSCULAR | Status: DC | PRN

## 2020-09-23 MED ORDER — METOCLOPRAMIDE HCL 5 MG/ML IJ SOLN
10.0000 mg | Freq: Four times a day (QID) | INTRAMUSCULAR | Status: DC
  Administered 2020-09-23 – 2020-09-29 (×25): 10 mg via INTRAVENOUS
  Filled 2020-09-23 (×25): qty 2

## 2020-09-23 MED ORDER — LORAZEPAM 2 MG/ML IJ SOLN
INTRAMUSCULAR | Status: AC
  Filled 2020-09-23: qty 1

## 2020-09-23 MED ORDER — LORAZEPAM 1 MG PO TABS: 1.0000 mg | ORAL_TABLET | ORAL | Status: DC | PRN

## 2020-09-23 MED ORDER — ACETAMINOPHEN 10 MG/ML IV SOLN
1000.0000 mg | Freq: Four times a day (QID) | INTRAVENOUS | Status: AC
  Administered 2020-09-23 (×3): 1000 mg via INTRAVENOUS
  Filled 2020-09-23 (×4): qty 100

## 2020-09-23 MED ORDER — ACETAMINOPHEN 325 MG PO TABS: 650.0000 mg | ORAL_TABLET | Freq: Four times a day (QID) | ORAL | Status: DC | PRN

## 2020-09-23 MED ORDER — ENOXAPARIN SODIUM 40 MG/0.4ML IJ SOSY
40.0000 mg | PREFILLED_SYRINGE | INTRAMUSCULAR | Status: DC
  Administered 2020-09-23 – 2020-09-29 (×5): 40 mg via SUBCUTANEOUS
  Filled 2020-09-23 (×6): qty 0.4

## 2020-09-23 MED ORDER — MAGNESIUM HYDROXIDE 400 MG/5ML PO SUSP: 30.0000 mL | Freq: Every day | ORAL | Status: DC | PRN

## 2020-09-23 MED ORDER — SODIUM CHLORIDE 0.9 % IV SOLN
2.0000 g | INTRAVENOUS | Status: DC
  Administered 2020-09-23 – 2020-09-24 (×2): 2 g via INTRAVENOUS
  Filled 2020-09-23: qty 20
  Filled 2020-09-23: qty 2

## 2020-09-23 MED ORDER — FOLIC ACID 1 MG PO TABS
1.0000 mg | ORAL_TABLET | Freq: Every day | ORAL | Status: DC
  Administered 2020-09-25: 1 mg via ORAL
  Filled 2020-09-23: qty 1

## 2020-09-23 MED ORDER — THIAMINE HCL 100 MG PO TABS
100.0000 mg | ORAL_TABLET | Freq: Every day | ORAL | Status: DC
  Administered 2020-09-25: 100 mg via ORAL
  Filled 2020-09-23: qty 1

## 2020-09-23 MED ORDER — ONDANSETRON HCL 4 MG PO TABS: 4.0000 mg | ORAL_TABLET | Freq: Four times a day (QID) | ORAL | Status: DC | PRN

## 2020-09-23 MED ORDER — LACTATED RINGERS IV BOLUS
1000.0000 mL | Freq: Once | INTRAVENOUS | Status: AC
  Administered 2020-09-23: 1000 mL via INTRAVENOUS

## 2020-09-23 MED ORDER — METRONIDAZOLE 500 MG/100ML IV SOLN
500.0000 mg | Freq: Three times a day (TID) | INTRAVENOUS | Status: DC
  Administered 2020-09-23 – 2020-09-24 (×4): 500 mg via INTRAVENOUS
  Filled 2020-09-23 (×6): qty 100

## 2020-09-23 MED ORDER — ONDANSETRON 4 MG PO TBDP
4.0000 mg | ORAL_TABLET | Freq: Once | ORAL | Status: AC | PRN
  Administered 2020-09-23: 4 mg via ORAL
  Filled 2020-09-23: qty 1

## 2020-09-23 NOTE — ED Notes (Signed)
Patient frequently moving around screaming help me and not following commands. Plan for head CT after [place] is complete.

## 2020-09-23 NOTE — ED Triage Notes (Signed)
Pt assisted to bathroom at start of triage

## 2020-09-23 NOTE — ED Notes (Signed)
Ice packs on groin and armpits.

## 2020-09-23 NOTE — H&P (Addendum)
[place]   PATIENT NAME: Andrea King    MR#:  [id number]  DATE OF BIRTH:  [birth year]  DATE OF ADMISSION:  09/23/2020  PRIMARY CARE PHYSICIAN: Alberteen Spindle, CNM   Patient is coming from: Home  REQUESTING/REFERRING PHYSICIAN: Nita Sickle, MD CHIEF COMPLAINT:   Chief Complaint  Patient presents with  . Emesis    HISTORY OF PRESENT ILLNESS:  Andrea King is a 45 y.o. 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  She did not have any melena or bright red blood per rectum.  She denies any cough or wheezing or dyspnea.  No fever or chills.  No diarrhea.  No dysuria, oliguria or hematuria or flank pain. ED Course: Upon presentation to the ER heart rate was 112 and later 121 with respiratory rate of 13, temperature 98.1 near 99.1 blood pressure 103/66 with pulse 71% on room air.  Labs revealed unremarkable CMP except for low albumin of 3.2.  Her ammonia level was 26.  Lactic acid was 1.  CBC showed significant leukocytosis of 23.2.  UA showed 21-50 RBCs and 0-5 WBCs, negative nitrite and Esterase.  Urine drug screen came back negative.  Pregnancy test came back negative.  Imaging: Noncontrast head CT revealed no acute intracranial normalities. Abdominal and pelvic CT scan revealed the following: Questionable gallbladder wall thickening with pericholecystic fluid with recommendation of right upper quadrant ultrasound.  It showed hepatosplenomegaly with question of an inferior splenic lesion versus adjacent small bowel.   Showed small left lower inguinal hernia that is fat-containing and likely decompressed to short loop of small bowel with no evidence of bowel obstruction.  It showed trace perihepatic and pelvic ascites and multiple pulmonary nodules with recommendation for repeat CT in 3 to 6 months.  Right upper quad ultrasound showed the following: 1. Gallbladder sludge with gallbladder wall thickening which could be due to chronic liver disease in a patient with cirrhosis. No definite finding of acute cholecystitis or choledocholithiasis. 2. Increased renal parenchymal echogenicity suggestive of renal parenchymal disease.   The patient was given 1 L bolus of IV lactated Ringer, 4 mg IV Zofran twice, 1 g of IV Toradol and IV Zosyn for suspected spontaneous bacterial peritonitis.  She will be admitted to a medical monitored bed for further evaluation and management.Marland Kitchen PAST MEDICAL HISTORY:   Past Medical History:  Diagnosis Date  . Anemia   . Asthma   . DJD (degenerative joint disease)   . Hepatitis C   . 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 INGUINAL HERNIA REPAIR Left 04/28/2013   Procedure: HERNIA REPAIR INGUINAL ADULT with mesh;  Surgeon: Cherylynn Ridges, MD;  Location: [facility] OR;  Service: General;  Laterality: Left;  . TEE WITHOUT CARDIOVERSION N/A 11/18/2015   Procedure: TRANSESOPHAGEAL ECHOCARDIOGRAM (TEE);  Surgeon: Chelsea Aus  Kirke Corin, MD;  Location: [facility] ORS;  Service: Cardiovascular;  Laterality: N/A;  . TEE WITHOUT CARDIOVERSION N/A 12/31/2017   Procedure: TRANSESOPHAGEAL ECHOCARDIOGRAM (TEE);  Surgeon: Dalia Heading, MD;  Location: [facility] ORS;  Service:  Cardiovascular;  Laterality: N/A;  . TEE WITHOUT CARDIOVERSION N/A 01/04/2018   Procedure: TRANSESOPHAGEAL ECHOCARDIOGRAM (TEE);  Surgeon: Dalia Heading, MD;  Location: [facility] ORS;  Service: Cardiovascular;  Laterality: N/A;  . tricuspid valve replacement    . TUMOR EXCISION     left scalpula    SOCIAL HISTORY:   Social History   Tobacco Use  . Smoking status: Current Every Day Smoker    Types: Cigarettes  . Smokeless tobacco: Never Used  Substance Use Topics  . Alcohol use: No    Alcohol/week: 0.0 standard drinks    FAMILY HISTORY:   Family History  Problem Relation Age of Onset  . Cancer Mother   . Cancer - Colon Maternal Grandfather   . Diabetes Maternal Grandfather     DRUG ALLERGIES:  No Known Allergies  REVIEW OF SYSTEMS:   ROS As per history of present illness. All pertinent systems were reviewed above. Constitutional, HEENT, cardiovascular, respiratory, GI, GU, musculoskeletal, neuro, psychiatric, endocrine, integumentary and hematologic systems were reviewed and are otherwise negative/unremarkable except for positive findings mentioned above in the HPI.   MEDICATIONS AT HOME:   Prior to Admission medications   Medication Sig Start Date End Date Taking? Authorizing Provider  amoxicillin-clavulanate (AUGMENTIN) 875-125 MG tablet Take 1 tablet by mouth 2 (two) times daily.    [provider]  aspirin EC 81 MG tablet Take 81 mg by mouth daily.    [provider]  Buprenorphine HCl-Naloxone HCl 8-2 MG FILM Place under the tongue.    [provider]  doxycycline (VIBRAMYCIN) 100 MG capsule Take 100 mg by mouth 2 (two) times daily.    [provider]  gabapentin (NEURONTIN) 300 MG capsule Take 300 mg by mouth 3 (three) times daily. Take 2 caps in the AM, midday, and 3 caps at night    [provider]  hydrOXYzine (VISTARIL) 25 MG capsule Take 25 mg by mouth 4 (four) times daily.    [provider]      VITAL  SIGNS:  Blood pressure 117/69, pulse (!) 121, temperature 99.2 F (37.3 C), temperature source Oral, resp. rate 18, height 5\' 2"  (1.575 m), weight 54.4 kg, SpO2 97 %.  PHYSICAL EXAMINATION:  Physical Exam  GENERAL:  45 y.o.-year-old Caucasian female patient lying in the bed with mild distress from recurrent vomiting.  EYES: Pupils equal, round, reactive to light and accommodation. No scleral icterus. Extraocular muscles intact.  HEENT: Head atraumatic, normocephalic. Oropharynx and nasopharynx clear.  NECK:  Supple, no jugular venous distention. No thyroid enlargement, no tenderness.  LUNGS: Normal breath sounds bilaterally, no wheezing, rales,rhonchi or crepitation. No use of accessory muscles of respiration.  CARDIOVASCULAR: Regular rate and rhythm, S1, S2 normal. No murmurs, rubs, or gallops.  ABDOMEN: Soft, nondistended, with minimal generalized tenderness in the epigastric area without rebound guarding or rigidity bowel sounds present. No organomegaly or mass.  EXTREMITIES: No pedal edema, cyanosis, or clubbing.  NEUROLOGIC: Cranial nerves II through XII are intact. Muscle strength 5/5 in all extremities. Sensation intact. Gait not checked.  PSYCHIATRIC: The patient is alert and oriented x 3.  Normal affect and good eye contact. SKIN: No obvious rash, lesion, or ulcer.   LABORATORY PANEL:   CBC Recent Labs  Lab 09/23/20 0043  WBC 23.2*  HGB 9.9*  HCT 30.9*  PLT 190   ------------------------------------------------------------------------------------------------------------------  Chemistries  Recent Labs  Lab 09/23/20 0043  NA 135  K 3.8  CL 100  CO2 24  GLUCOSE 130*  BUN 11  CREATININE 0.77  CALCIUM 8.8*  AST 22  ALT 13  ALKPHOS 109  BILITOT 0.8   ------------------------------------------------------------------------------------------------------------------  Cardiac Enzymes No results for input(s): TROPONINI in the last 168  hours. ------------------------------------------------------------------------------------------------------------------  RADIOLOGY:  CT ABDOMEN PELVIS WO CONTRAST  Addendum Date: 09/23/2020   ADDENDUM REPORT: 09/23/2020 03:32 ADDENDUM: Please note findings and impression should note cirrhosis with portal hypertension. Electronically Signed   By: Tish Frederickson M.D.   On: 09/23/2020 03:32   Result Date: 09/23/2020 CLINICAL DATA:  Nonlocalized acute abdominal pain. History of septic emboli in 2021. EXAM: CT ABDOMEN AND PELVIS WITHOUT CONTRAST TECHNIQUE: Multidetector CT imaging of the abdomen and pelvis was performed following the standard protocol without IV contrast. COMPARISON:  Chest x-ray 09/18/2018, CT abdomen pelvis 04/08/2013 FINDINGS: Lower chest: Subsegmental atelectasis with question of a 1.6 x 0.6 cm lesion within the right lower lobe. Question subpleural micronodule just superiorly (3:1). Subpleural pulmonary micronodule within the left lower lobe (3:6). 6 mm pulmonary nodule within the right middle lobe (3:6). Subpleural nodule measuring 5 mm within the right middle lobe (3:8). Replace tricuspid valve.  Retained epicardial pacing wires. Hepatobiliary: The liver is enlarged in size measuring up to 24 cm. No focal liver abnormality. Question of gallbladder wall thickening and pericholecystic fluid. No CT findings gallbladder stones. No biliary dilatation. Pancreas: No focal lesion. Normal pancreatic contour. No surrounding inflammatory changes. No main pancreatic ductal dilatation. Spleen: The spleen is enlarged measuring up to 15.5 cm. Question inferior splenic lesion versus adjacent colon (5:56). Adrenals/Urinary Tract: No adrenal nodule bilaterally. No nephrolithiasis, no hydronephrosis, and no contour-deforming renal mass. No ureterolithiasis or hydroureter. The urinary bladder is unremarkable. Stomach/Bowel: Stomach is within normal limits. No evidence of bowel wall thickening or  dilatation. No pneumatosis. Appendix appears normal. Vascular/Lymphatic: No abdominal aorta or iliac aneurysm. Mild atherosclerotic plaque of the aorta and its branches. No abdominal, pelvic, or inguinal lymphadenopathy. Reproductive: Uterus and bilateral adnexa/ovaries are unremarkable. Other: Trace free perihepatic fluid. Trace free pelvic fluid. No intraperitoneal free gas. No organized fluid collection. Musculoskeletal: Small left inguinal hernia containing fat and likely decompressed loop of small bowel. No suspicious lytic or blastic osseous lesions. No acute displaced fracture. 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 Fusion of the L5-S1 vertebral bodies. IMPRESSION: 1. Question gallbladder wall thickening and pericholecystic fluid. Recommend right upper quadrant ultrasound for a more sensitive evaluation. 2. Hepatosplenomegaly with question of an inferior splenic lesion versus adjacent small bowel. 3. Small left inguinal hernia containing fat and likely decompressed short loop of small bowel. No definite associated bowel obstruction. Limited evaluation for ischemia on this noncontrast study with no definite pneumatosis identified. 4. Trace perihepatic and pelvic ascites. 5. Visualized lungs demonstrate multiple pulmonary nodules. Non-contrast chest CT at 3-6 months is recommended. If the nodules are stable at time of repeat CT, then future CT at 18-24 months (from today's scan) is considered optional for low-risk patients, but is recommended for high-risk patients. This recommendation follows the consensus statement: Guidelines for Management of Incidental Pulmonary Nodules Detected on CT Images: From the Fleischner Society 2017; Radiology 2017; 284:228-243. Electronically Signed: By: Tish Frederickson M.D. On: 09/23/2020 02:36   CT Head Wo Contrast  Result Date: 09/23/2020 CLINICAL  DATA:  Delirium EXAM: CT HEAD WITHOUT CONTRAST TECHNIQUE: Contiguous  axial images were obtained from the base of the skull through the vertex without intravenous contrast. COMPARISON:  None. FINDINGS: Brain: There is no mass, hemorrhage or extra-axial collection. The size and configuration of the ventricles and extra-axial CSF spaces are normal. The brain parenchyma is normal, without acute or chronic infarction. Vascular: No abnormal hyperdensity of the major intracranial arteries or dural venous sinuses. No intracranial atherosclerosis. Skull: The visualized skull base, calvarium and extracranial soft tissues are normal. Sinuses/Orbits: No fluid levels or advanced mucosal thickening of the visualized paranasal sinuses. No mastoid or middle ear effusion. The orbits are normal. IMPRESSION: Normal head CT. Electronically Signed   By: Deatra Robinson M.D.   On: 09/23/2020 03:49   US ABDOMEN LIMITED RUQ (LIVER/GB)  Result Date: 09/23/2020 CLINICAL DATA:  Pain.  Encephalopathy. EXAM: ULTRASOUND ABDOMEN LIMITED RIGHT UPPER QUADRANT COMPARISON:  CT abdomen pelvis 09/23/2020 FINDINGS: Gallbladder: Gallbladder sludge identified. Enlarged gallbladder wall measuring up to 6 mm. No pericholecystic fluid. No gallstones. No sonographic Murphy sign noted by sonographer. Common bile duct: Diameter: 2 mm. Liver: Nodular hepatic contour. No focal lesion identified. Coarsened heterogeneous parenchymal echogenicity. Portal vein is patent on color Doppler imaging with normal direction of blood flow towards the liver. Other: Increased renal parenchymal echogenicity. IMPRESSION: 1. Gallbladder sludge with gallbladder wall thickening which could be due to chronic liver disease in a patient with cirrhosis. No definite finding of acute cholecystitis or choledocholithiasis. 2. Increased renal parenchymal echogenicity suggestive of renal parenchymal disease. Electronically Signed   By: Tish Frederickson M.D.   On: 09/23/2020 03:35      IMPRESSION AND PLAN:  Active Problems:   SBP (spontaneous bacterial  peritonitis) (HCC) 1.  Suspected spontaneous bacterial peritonitis with subsequent sepsis as manifested by leukocytosis and tachycardia and associated altered mental status with confusion. - The patient will be admitted to a medical monitor bed. - She will be placed on hydration IV normal saline. - Pain management to be provided. - We will continue antibiotic therapy with IV Rocephin and Flagyl. - GI consultation will be obtained. - I notified Dr. Tobi Bastos about the patient. - We will follow neurochecks every 4 hours for 24 hours.  2.  Intractable nausea and vomiting, possibly secondary to acute gastritis. - The patient will be placed on IV PPI therapy - We will add scheduled IV Reglan.  3.  Polysubstance abuse including tobacco, alcohol and benzodiazepine with history of IV drug use. - She was counseled for cessation.  4.  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 - The patient has been continued on p.o. Augmentin. - Given her recurrent vomiting Augmentin was held off while she is placed on IV Rocephin. - Augmentin can be resumed with improvement of her vomiting.  5.  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 - Pain management will be provided. - GI consult will be obtained as mentioned above  DVT prophylaxis: Lovenox. Code Status: full code. Family Communication:  The plan of care was discussed in details with the patient (requested Members to be notified at this time). I answered all questions. The patient agreed to proceed with the above mentioned plan. Further management will depend upon hospital course. Disposition Plan: Back to previous home environment Consults called: none. All the records are reviewed and case discussed with ED provider.  Status is: Inpatient  Remains inpatient appropriate because:Ongoing active pain requiring  inpatient pain management, Ongoing  diagnostic testing needed not appropriate for outpatient work up, Unsafe d/c plan, IV treatments appropriate due to intensity of illness or inability to take PO and Inpatient level of care appropriate due to severity of illness   Dispo: The patient is from: Home              Anticipated d/c is to: Home              Patient currently is not medically stable to d/c.   Difficult to place patient No   TOTAL TIME TAKING CARE OF THIS PATIENT: 55 minutes.    Hannah BeatJan A Latonyia Lopata M.D on 09/23/2020 at 4:49 AM  [facility]   From 7 PM-7 AM, contact night-coverage [email or web address]  CC: Primary care physician; Alberteen SpindleSciora, [name], CNM

## 2020-09-23 NOTE — ED Provider Notes (Addendum)
[facility] Emergency Department Provider Note  ____________________________________________  Time seen: Approximately 3:48 AM  I have reviewed the triage vital signs and the nursing notes.   HISTORY  Chief Complaint Emesis  Level 5 caveat:  Portions of the history and physical were unable to be obtained due to encephalopathy   HPI Andrea King is a 45 y.o. female history of substance abuse, hepatitis C, anemia who presents for evaluation of abdominal pain.  Patient is encephalopathic but is complaining of abdominal pain, nausea and vomiting.  Will not provide much history.  She was dropped off by a friend who provided initial history in triage.   Past Medical History:  Diagnosis Date  . Anemia   . Asthma   . DJD (degenerative joint disease)   . Hepatitis C   . Substance abuse [facility])     Patient Active Problem List   Diagnosis Date Noted  . SBP (spontaneous bacterial peritonitis) (HCC) 09/23/2020  . Metabolic acidosis 09/19/2019  . Septic pulmonary embolism (HCC) 09/19/2019  . 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 AKI (acute kidney injury) (HCC) 09/18/2019  . Hypokalemia 09/18/2019  . Anemia in other chronic diseases classified elsewhere 09/18/2019  . Chronic hepatitis C without hepatic coma (HCC) 09/18/2019  . Protein-calorie malnutrition, severe 08/31/2019  . 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 Malnutrition of moderate degree 12/29/2017  . 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 Palliative care by specialist   . Goals of care, counseling/discussion   . Periorbital cellulitis 09/20/2017  . Hypoglycemia 09/20/2017  . Bacteremia due to Staphylococcus   . Sepsis (HCC) 11/13/2015  . Community acquired pneumonia 11/13/2015  . Pneumonia 11/13/2015  . Substance induced mood disorder (HCC) 11/13/2015  . Opiate withdrawal (HCC) 11/13/2015  . Opiate abuse, continuous (HCC) 11/13/2015  . Incarcerated femoral hernia left 04/28/2013    Past  Surgical History:  Procedure Laterality Date  . INGUINAL HERNIA REPAIR Left 04/28/2013   Procedure: HERNIA REPAIR INGUINAL ADULT with mesh;  Surgeon: Cherylynn Ridges, MD;  Location: [facility] OR;  Service: General;  Laterality: Left;  . TEE WITHOUT CARDIOVERSION N/A 11/18/2015   Procedure: TRANSESOPHAGEAL ECHOCARDIOGRAM (TEE);  Surgeon: Iran Ouch, MD;  Location: [facility] ORS;  Service: Cardiovascular;  Laterality: N/A;  . TEE WITHOUT CARDIOVERSION N/A 12/31/2017   Procedure: TRANSESOPHAGEAL ECHOCARDIOGRAM (TEE);  Surgeon: Dalia Heading, MD;  Location: [facility] ORS;  Service: Cardiovascular;  Laterality: N/A;  . TEE WITHOUT CARDIOVERSION N/A 01/04/2018   Procedure: TRANSESOPHAGEAL ECHOCARDIOGRAM (TEE);  Surgeon: Dalia Heading, MD;  Location: [facility] ORS;  Service: Cardiovascular;  Laterality: N/A;  . tricuspid valve replacement    . TUMOR EXCISION     left scalpula    Prior to Admission medications   Medication Sig Start Date End Date Taking? Authorizing Provider  amoxicillin-clavulanate (AUGMENTIN) 875-125 MG tablet Take 1 tablet by mouth 2 (two) times daily.    [provider]  aspirin EC 81 MG tablet Take 81 mg by mouth daily.    [provider]  Buprenorphine HCl-Naloxone HCl 8-2 MG FILM Place under the tongue.    [provider]  doxycycline (VIBRAMYCIN) 100 MG capsule Take 100 mg by mouth 2 (two) times daily.    [provider]  gabapentin (NEURONTIN) 300 MG capsule Take 300 mg by mouth 3 (three) times daily. Take 2 caps in the AM, midday, and 3 caps at night    [provider]  hydrOXYzine (VISTARIL) 25 MG capsule Take 25 mg by  mouth 4 (four) times daily.    [provider]    Allergies Patient has no known allergies.  Family History  Problem Relation Age of Onset  . Cancer Mother   . Cancer - Colon Maternal Grandfather   . Diabetes Maternal Grandfather     Social History Social History   Tobacco Use  . Smoking status: Current  Every Day Smoker    Types: Cigarettes  . Smokeless tobacco: Never Used  Substance Use Topics  . Alcohol use: No    Alcohol/week: 0.0 standard drinks  . Drug use: Yes    Comment: cocaine,Heroine    Review of Systems  Constitutional: Negative for fever. Gastrointestinal: + abdominal pain, vomiting.  Level 5 caveat:  Portions of the history and physical were unable to be obtained due to ams   ____________________________________________   PHYSICAL EXAM:  VITAL SIGNS: ED Triage Vitals  Enc Vitals Group     BP 09/23/20 0031 110/70     Pulse Rate 09/23/20 0031 (!) 133     Resp 09/23/20 0031 20     Temp 09/23/20 0031 98.5 F (36.9 C)     Temp Source 09/23/20 0031 Oral     SpO2 09/23/20 0031 98 %     Weight 09/23/20 0032 120 lb (54.4 kg)     Height 09/23/20 0032 5\' 2"  (1.575 m)     Head Circumference --      Peak Flow --      Pain Score --      Pain Loc --      Pain Edu? --      Excl. in GC? --     Constitutional: Alert, actively vomiting, will not hold vomit bag, moaning "help me" but not providing much history. Shake her head yes when asked if she has abdominal pain HEENT:      Head: Normocephalic and atraumatic.         Eyes: Conjunctivae are normal. Sclera is non-icteric.       Mouth/Throat: Mucous membranes are moist.       Neck: Supple with no signs of meningismus. Cardiovascular: Tachycardic with regular rhythm Respiratory: Normal respiratory effort. Lungs are clear to auscultation bilaterally.  Gastrointestinal: Soft, diffusely tender to palpation, and non distended with positive bowel sounds. No rebound or guarding. Genitourinary: No CVA tenderness. Musculoskeletal:  No edema, cyanosis, or erythema of extremities. Neurologic: Moving all extremities, face is symmetric Skin: Skin is warm, dry and intact. No rash noted. Psychiatric: Mood and affect are normal. Speech and behavior are normal.  ____________________________________________   LABS (all labs  ordered are listed, but only abnormal results are displayed)  Labs Reviewed  COMPREHENSIVE METABOLIC PANEL - Abnormal; Notable for the following components:      Result Value   Glucose, Bld 130 (*)    Calcium 8.8 (*)    Albumin 3.2 (*)    All other components within normal limits  CBC - Abnormal; Notable for the following components:   WBC 23.2 (*)    Hemoglobin 9.9 (*)    HCT 30.9 (*)    MCV 79.8 (*)    MCH 25.6 (*)    All other components within normal limits  URINALYSIS, COMPLETE (UACMP) WITH MICROSCOPIC - Abnormal; Notable for the following components:   Color, Urine YELLOW (*)    APPearance CLEAR (*)    pH 9.0 (*)    Hgb urine dipstick MODERATE (*)    Protein, ur 30 (*)    All other  components within normal limits  RESP PANEL BY RT-PCR (FLU A&B, COVID) ARPGX2  CULTURE, BLOOD (ROUTINE X 2)  CULTURE, BLOOD (ROUTINE X 2)  LIPASE, BLOOD  URINE DRUG SCREEN, QUALITATIVE ([facility] ONLY)  LACTIC ACID, PLASMA  AMMONIA  LACTIC ACID, PLASMA  HIV ANTIBODY (ROUTINE TESTING W REFLEX)  CBC  COMPREHENSIVE METABOLIC PANEL  PROTIME-INR  CBC WITH DIFFERENTIAL/PLATELET  COMPREHENSIVE METABOLIC PANEL  LACTIC ACID, PLASMA  LACTIC ACID, PLASMA  PROTIME-INR  APTT  PROCALCITONIN  POC URINE PREG, ED   ____________________________________________  EKG  none  ____________________________________________  RADIOLOGY  I have personally reviewed the images performed during this visit and I agree with the Radiologist's read.   Interpretation by Radiologist:  CT ABDOMEN PELVIS WO CONTRAST  Addendum Date: 09/23/2020   ADDENDUM REPORT: 09/23/2020 03:32 ADDENDUM: Please note findings and impression should note cirrhosis with portal hypertension. Electronically Signed   By: Tish Frederickson M.D.   On: 09/23/2020 03:32   Result Date: 09/23/2020 CLINICAL DATA:  Nonlocalized acute abdominal pain. History of septic emboli in 2021. EXAM: CT ABDOMEN AND PELVIS WITHOUT CONTRAST TECHNIQUE:  Multidetector CT imaging of the abdomen and pelvis was performed following the standard protocol without IV contrast. COMPARISON:  Chest x-ray 09/18/2018, CT abdomen pelvis 04/08/2013 FINDINGS: Lower chest: Subsegmental atelectasis with question of a 1.6 x 0.6 cm lesion within the right lower lobe. Question subpleural micronodule just superiorly (3:1). Subpleural pulmonary micronodule within the left lower lobe (3:6). 6 mm pulmonary nodule within the right middle lobe (3:6). Subpleural nodule measuring 5 mm within the right middle lobe (3:8). Replace tricuspid valve.  Retained epicardial pacing wires. Hepatobiliary: The liver is enlarged in size measuring up to 24 cm. No focal liver abnormality. Question of gallbladder wall thickening and pericholecystic fluid. No CT findings gallbladder stones. No biliary dilatation. Pancreas: No focal lesion. Normal pancreatic contour. No surrounding inflammatory changes. No main pancreatic ductal dilatation. Spleen: The spleen is enlarged measuring up to 15.5 cm. Question inferior splenic lesion versus adjacent colon (5:56). Adrenals/Urinary Tract: No adrenal nodule bilaterally. No nephrolithiasis, no hydronephrosis, and no contour-deforming renal mass. No ureterolithiasis or hydroureter. The urinary bladder is unremarkable. Stomach/Bowel: Stomach is within normal limits. No evidence of bowel wall thickening or dilatation. No pneumatosis. Appendix appears normal. Vascular/Lymphatic: No abdominal aorta or iliac aneurysm. Mild atherosclerotic plaque of the aorta and its branches. No abdominal, pelvic, or inguinal lymphadenopathy. Reproductive: Uterus and bilateral adnexa/ovaries are unremarkable. Other: Trace free perihepatic fluid. Trace free pelvic fluid. No intraperitoneal free gas. No organized fluid collection. Musculoskeletal: Small left inguinal hernia containing fat and likely decompressed loop of small bowel. No suspicious lytic or blastic osseous lesions. No acute  displaced fracture. 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 Fusion of the L5-S1 vertebral bodies. IMPRESSION: 1. Question gallbladder wall thickening and pericholecystic fluid. Recommend right upper quadrant ultrasound for a more sensitive evaluation. 2. Hepatosplenomegaly with question of an inferior splenic lesion versus adjacent small bowel. 3. Small left inguinal hernia containing fat and likely decompressed short loop of small bowel. No definite associated bowel obstruction. Limited evaluation for ischemia on this noncontrast study with no definite pneumatosis identified. 4. Trace perihepatic and pelvic ascites. 5. Visualized lungs demonstrate multiple pulmonary nodules. Non-contrast chest CT at 3-6 months is recommended. If the nodules are stable at time of repeat CT, then future CT at 18-24 months (from today's scan) is considered optional for low-risk patients, but is recommended for high-risk patients. This recommendation follows  the consensus statement: Guidelines for Management of Incidental Pulmonary Nodules Detected on CT Images: From the Fleischner Society 2017; Radiology 2017; 284:228-243. Electronically Signed: By: Tish Frederickson M.D. On: 09/23/2020 02:36   CT Head Wo Contrast  Result Date: 09/23/2020 CLINICAL DATA:  Delirium EXAM: CT HEAD WITHOUT CONTRAST TECHNIQUE: Contiguous axial images were obtained from the base of the skull through the vertex without intravenous contrast. COMPARISON:  None. FINDINGS: Brain: There is no mass, hemorrhage or extra-axial collection. The size and configuration of the ventricles and extra-axial CSF spaces are normal. The brain parenchyma is normal, without acute or chronic infarction. Vascular: No abnormal hyperdensity of the major intracranial arteries or dural venous sinuses. No intracranial atherosclerosis. Skull: The visualized skull base, calvarium and extracranial soft tissues are normal.  Sinuses/Orbits: No fluid levels or advanced mucosal thickening of the visualized paranasal sinuses. No mastoid or middle ear effusion. The orbits are normal. IMPRESSION: Normal head CT. Electronically Signed   By: Deatra Robinson M.D.   On: 09/23/2020 03:49   US ABDOMEN LIMITED RUQ (LIVER/GB)  Result Date: 09/23/2020 CLINICAL DATA:  Pain.  Encephalopathy. EXAM: ULTRASOUND ABDOMEN LIMITED RIGHT UPPER QUADRANT COMPARISON:  CT abdomen pelvis 09/23/2020 FINDINGS: Gallbladder: Gallbladder sludge identified. Enlarged gallbladder wall measuring up to 6 mm. No pericholecystic fluid. No gallstones. No sonographic Murphy sign noted by sonographer. Common bile duct: Diameter: 2 mm. Liver: Nodular hepatic contour. No focal lesion identified. Coarsened heterogeneous parenchymal echogenicity. Portal vein is patent on color Doppler imaging with normal direction of blood flow towards the liver. Other: Increased renal parenchymal echogenicity. IMPRESSION: 1. Gallbladder sludge with gallbladder wall thickening which could be due to chronic liver disease in a patient with cirrhosis. No definite finding of acute cholecystitis or choledocholithiasis. 2. Increased renal parenchymal echogenicity suggestive of renal parenchymal disease. Electronically Signed   By: Tish Frederickson M.D.   On: 09/23/2020 03:35     ____________________________________________   PROCEDURES  Procedure(s) performed: yes .1-3 Lead EKG Interpretation Performed by: Nita Sickle, MD Authorized by: Nita Sickle, MD     Interpretation: non-specific     ECG rate assessment: tachycardic     Rhythm: sinus tachycardia     Ectopy: none     Conduction: normal     Critical Care performed:  None ____________________________________________   INITIAL IMPRESSION / ASSESSMENT AND PLAN / ED COURSE   45 y.o. female history of substance abuse, hepatitis C, anemia who presents for evaluation of abdominal pain.  Patient is encephalopathic.   Unclear what her baseline is.  She has no family here with her.  All she says does help me and will not provide any other history.  According to friend her drop her off earlier today she was complaining of abdominal pain and vomiting.  She is diffusely tender to palpation with no distention.  History of cirrhosis but has very small of ascites just around the liver, so unable to tap it to rule out SBP. CT abdomen pelvis and ultrasound of the gallbladder showing only sludge.  Labs showing leukocytosis with white count of 23.2 but normal LFTs, normal lipase, normal ammonia, normal lactic.  Pregnancy test is negative.  UA is negative for urinary tract infection.  UDS is negative.  Due to altered mental status patient was sent for head CT which is normal.  At this time unclear etiology of patient's symptoms especially with her altered mental status.  Will cover with broad-spectrum antibiotics in case patient has an intra-abdominal infection such as SBP.  Will  give IV fluids, Zofran and Toradol for pain.  Will discuss to hospitalist for admission.  Patient has no meningeal signs.     _________________________ 6:54 AM on 09/23/2020 -----------------------------------------  Patient admitted to the hospitalist service but awaiting a bed down here.  I got called in the room by the nurse since patient was tachycardic again with pulse of 136.  We did get a rectal temperature which was 105.3.  Patient still encephalopathic therefore IV Tylenol has been ordered.  She is still receiving fluids.  Antibiotics given.  _____________________________________________ Please note:  Patient was evaluated in Emergency Department today for the symptoms described in the history of present illness. Patient was evaluated in the context of the global COVID-19 pandemic, which necessitated consideration that the patient might be at risk for infection with the SARS-CoV-2 virus that causes COVID-19. Institutional protocols and algorithms  that pertain to the evaluation of patients at risk for COVID-19 are in a state of rapid change based on information released by regulatory bodies including the CDC and federal and state organizations. These policies and algorithms were followed during the patient's care in the ED.  Some ED evaluations and interventions may be delayed as a result of limited staffing during the pandemic.   [place] Controlled Substance Database was reviewed by me. ____________________________________________   FINAL CLINICAL IMPRESSION(S) / ED DIAGNOSES   Final diagnoses:  Abdominal pain  Altered mental status, unspecified altered mental status type  Generalized abdominal pain  Intractable vomiting with nausea, unspecified vomiting type      NEW MEDICATIONS STARTED DURING THIS VISIT:  ED Discharge Orders    None       Note:  This document was prepared using Dragon voice recognition software and may include unintentional dictation errors.    Don PerkingVeronese, [place], MD 09/23/20 [id number]    Nita SickleVeronese, [place], MD 09/23/20 [phone]

## 2020-09-23 NOTE — ED Triage Notes (Signed)
Pt denies and drug or alcohol use today. Pt keeps saying "help me" and moaning.

## 2020-09-23 NOTE — ED Triage Notes (Addendum)
Pt arrived via POV with friend, reports vomiting for the last few hours and abdominal pain.  Pt dry heaving in lobby. Pt has hx of substance abuse.

## 2020-09-23 NOTE — ED Notes (Signed)
Patient transported to CT 

## 2020-09-23 NOTE — Progress Notes (Signed)
1mg  Ativan given for agitation.

## 2020-09-23 NOTE — ED Notes (Signed)
Unable to obtain blood cultures before abx. Provider aware. Okay to administer abx.

## 2020-09-23 NOTE — ED Notes (Signed)
Unable to obtain IV access. Patient screaming when attempting to place IV. Patient moving and thrashing when attempting to put in IV. Second RN at bedside to assist. Provider aware.

## 2020-09-23 NOTE — Progress Notes (Signed)
Dr. Nelson Chimes notified that patient is rolling around in bed and moaning. MRI scheduled for after 1pm today. Ativan ordered for MRI. Dr. Nelson Chimes ordered 1mg  ativan to be given now.

## 2020-09-23 NOTE — ED Notes (Signed)
RN to bedside to obtain IV access and complete assessment. Patient frequently vomiting and spiting up on the floor. Patient not answering questions appropriately.

## 2020-09-23 NOTE — ED Notes (Signed)
Sent message to Dr Nelson Chimes to recommend ICU bed because pt not stable enough for medsurg. Informed of L gaze deviation and rectal T of 105 and recommended Dr Nelson Chimes come evaluate pt.

## 2020-09-23 NOTE — Progress Notes (Signed)
MD and MRI notified that patient is still not able to tolerate MRI at this time due to agitation. MD made aware that 1mg  of ativan did not seem to help earlier. 2mg  of ativan ordered to give prior to MRI. MRI unable to take patient until after 7pm.

## 2020-09-23 NOTE — ED Notes (Signed)
Patient heart rate increased to 140s-150s. Patient appears more agitated and has eye deviation to the left. ED provider aware and at bedside. Second RN at bedside to assist this RN with obtaining rectal temp. Patient hot to the touch with dry skin.

## 2020-09-23 NOTE — ED Notes (Signed)
Three RNs and MD at bedside for attempt to obtain IV access.

## 2020-09-23 NOTE — Progress Notes (Signed)
Pt pulling off mitts and yelling "I have to leave to go get my money!" Pt remains confused but is now easily redirected.

## 2020-09-23 NOTE — ED Notes (Signed)
Persistent dry heaving and vomiting noted. Provider aware. See orders.

## 2020-09-23 NOTE — ED Notes (Signed)
Bedside report given to [facility] G. RN

## 2020-09-23 NOTE — Progress Notes (Signed)
Pt arrived on unit at this time. Pt is rolling around in bed and moaning. Pt has left gaze deviation. Pupils are equal and reactive. Pt will not follow commands. Safety mitts on. Tachycardic on monitor, all other VS stable at this time.

## 2020-09-23 NOTE — Progress Notes (Signed)
Pt pulling safety mitts off and saying she is leaving. Pt is now able to make eye contact, follow some commands, and answer some questions appropriately. Pt redirected.

## 2020-09-23 NOTE — ED Notes (Signed)
Ultrasound at bedside

## 2020-09-23 NOTE — Progress Notes (Signed)
Pt pulled mittens and IV out.

## 2020-09-23 NOTE — Progress Notes (Signed)
PROGRESS NOTE    Andrea King  IEP:[id number] DOB: 1975-07-27 DOA: 09/23/2020 PCP: [name], CNM   Brief Narrative: Taken from H&P. Andrea King is a 45 y.o. 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  She did not have any melena or bright red blood per rectum.  She denies any cough or wheezing or dyspnea.  No fever or chills.  No diarrhea.  No dysuria, oliguria or hematuria or flank pain. CBC with significant leukocytosis, UDS negative, COVID-19 PCR negative, abdominal imaging with some concern of pericholecystic fluid but ultrasound did not show any sign of cholecystitis to have some gallbladder sludge which could be due to chronic liver disease in a patient with cirrhosis.  Patient continued to have nausea and vomiting overnight.  Becoming more irritable and fever up to 105 noted on rectal check.  Concern of left upward gaze and not following any command.  No other obvious focal deficit-might be some functional component, unknown underlying psych history. Multiple track marks but UDS was negative.  MRI brain ordered  Subjective: Patient was seen and examined.  Per nursing staff she was having a lot of nausea and vomiting overnight, some improvement since this morning.  Worsening agitation requiring some restraints and intense nursing supervision.  There was also some concern of left upward gaze. Admission status changed to stepdown at nursing concern as she is needing more intensive supervision.  Assessment & Plan:   Active  Problems:   SBP (spontaneous bacterial peritonitis) ([place])   Encephalopathy  Acute metabolic encephalopathy.  Currently unclear etiology, although having leukocytosis but preliminary blood cultures negative.  No other source of infection.  Markedly elevated temperature which can be due to some viral illnesses.  Concern of left upper gaze, might have some functional component in it but we will do MRI brain to rule out any organic cause. Multiple track marks on body but UDS was negative.  History of prior IV drug use resulted in endocarditis and osteomyelitis, might be some withdrawal. -Get MRI brain -We will place her on CIWA protocol. -Continue ceftriaxone and Flagyl-abdominal exam was quite benign, not sure about abdominal pathology at this time. -Admit to stepdown as she needs more nursing supervision at this time. -Continue to monitor.  -Continue with supportive care  Sepsis.  Met sepsis criteria with being febrile, leukocytosis, tachycardia and tachypnea along with altered mental status.  Lactic acid elevated.  Procalcitonin at 38.81. Pending blood cultures. -Continue with ceftriaxone and Flagyl -Monitor cultures -Trend procalcitonin  History of polysubstance abuse.  Includes tobacco, alcohol, benzodiazepines and opioids.  Multiple track marks on body but UDS was negative. High risk for any withdrawal. -Put her on CIWA protocol -Continue to monitor  History of MSSA bacteremia with tricuspid valve endocarditis, septic emboli along with discitis/osteomyelitis.  Patient was on p.o. Augmentin which is being held at this time as she is unable to take p.o. -Follow-up blood cultures -Continue with ceftriaxone and Flagyl  History of post hepatitis C liver cirrhosis, current gallbladder sludge.  No sign of cholecystitis cholelithiasis.  It can be due to chronic liver disease.  Ammonia and lipase levels within normal limit. -GI was consulted by admitting provider-will appreciate their  recommendations.  Objective: Vitals:   09/23/20 1100 09/23/20 1200 09/23/20 1300 09/23/20 1400  BP: 104/70 (!) _0  Pulse: (!) 125 (!) 115 (!) 108 98  Resp: (!) 23 (!) 28 (!) 26 20  Temp:  97.7 F (36.5 C)  97.9 F (36.6 C)  TempSrc:  Oral  Axillary  SpO2: 100% 96% 95% 97%  Weight:      Height:        Intake/Output Summary (Last 24 hours) at 09/23/2020 1426 Last data filed at 09/23/2020 1128 Gross per 24 hour  Intake 1849.77 ml  Output 0 ml  Net 1849.77 ml   Filed Weights   09/23/20 0032 09/23/20 0845  Weight: 54.4 kg 48.7 kg    Examination:  General exam: Malnourished lady, thrashing around the bed with left upward gaze, not following any commands. Respiratory system: Clear to auscultation. Respiratory effort normal. Cardiovascular system: S1 & S2 heard, RRR.  Gastrointestinal system: Soft, nontender, nondistended, bowel sounds positive. Central nervous system: Left upper gaze, not following any commands Extremities: No edema, no cyanosis, pulses intact and symmetrical. Psychiatry: Judgement and insight appear impaired   DVT prophylaxis: Lovenox Code Status: Full Family Communication:  Disposition Plan:  Status is: Inpatient  Remains inpatient appropriate because:Inpatient level of care appropriate due to severity of illness   Dispo: The patient is from: Home              Anticipated d/c is to: Home              Patient currently is not medically stable to d/c.   Difficult to place patient No              Level of care: Stepdown  All the records are reviewed and case discussed with Care Management/Social Worker. Management plans discussed with the patient, nursing and they are in agreement.  Consultants:   None  Procedures:  Antimicrobials:  Ceftriaxone Metronidazole  Data Reviewed: I have personally reviewed following labs and imaging studies  CBC: Recent Labs  Lab 09/23/20 0043 09/23/20 0616 09/23/20 0744  WBC 23.2* 16.6* 16.7*   NEUTROABS  --  15.5* 15.5*  HGB 9.9* 7.8* 9.1*  HCT 30.9* 23.8* 27.8*  MCV 79.8* 81.5 79.0*  PLT 190 137* 287*   Basic Metabolic Panel: Recent Labs  Lab 09/23/20 0043 09/23/20 0616  NA 135 134*  K 3.8 3.4*  CL 100 99  CO2 24 22  GLUCOSE 130* 142*  BUN 11 14  CREATININE 0.77 0.94  CALCIUM 8.8* 8.1*   GFR: Estimated Creatinine Clearance: 53.7 mL/min (by C-G formula based on SCr of 0.94 mg/dL). Liver Function Tests: Recent Labs  Lab 09/23/20 0043 09/23/20 0616  AST 22 27  ALT 13 12  ALKPHOS 109 96  BILITOT 0.8 0.6  PROT 7.9 7.0  ALBUMIN 3.2* 2.7*   Recent Labs  Lab 09/23/20 0043  LIPASE 25   Recent Labs  Lab 09/23/20 0302  AMMONIA 26   Coagulation Profile: Recent Labs  Lab 09/23/20 0616 09/23/20 0744  INR 2.5* 1.3*   Cardiac Enzymes: No results for input(s): CKTOTAL, CKMB, CKMBINDEX, TROPONINI in the last 168 hours. BNP (last 3 results) No results for input(s): PROBNP in the last 8760 hours. HbA1C: No results for input(s): HGBA1C in the last 72 hours. CBG: No results for input(s): GLUCAP in the last 168 hours. Lipid Profile: No results for input(s):  CHOL, HDL, LDLCALC, TRIG, CHOLHDL, LDLDIRECT in the last 72 hours. Thyroid Function Tests: No results for input(s): TSH, T4TOTAL, FREET4, T3FREE, THYROIDAB in the last 72 hours. Anemia Panel: No results for input(s): VITAMINB12, FOLATE, FERRITIN, TIBC, IRON, RETICCTPCT in the last 72 hours. Sepsis Labs: Recent Labs  Lab 09/23/20 0302 09/23/20 0744  PROCALCITON  --  38.81  LATICACIDVEN 1.0 3.4*    Recent Results (from the past 240 hour(s))  Resp Panel by RT-PCR (Flu A&B, Covid) Nasopharyngeal Swab     Status: None   Collection Time: 09/23/20  6:16 AM   Specimen: Nasopharyngeal Swab; Nasopharyngeal(NP) swabs in vial transport medium  Result Value Ref Range Status   SARS Coronavirus 2 by RT PCR NEGATIVE NEGATIVE Final    Comment: (NOTE) SARS-CoV-2 target nucleic acids are NOT DETECTED.  The  SARS-CoV-2 RNA is generally detectable in upper respiratory specimens during the acute phase of infection. The lowest concentration of SARS-CoV-2 viral copies this assay can detect is 138 copies/mL. A negative result does not preclude SARS-Cov-2 infection and should not be used as the sole basis for treatment or other patient management decisions. A negative result may occur with  improper specimen collection/handling, submission of specimen other than nasopharyngeal swab, presence of viral mutation(s) within the areas targeted by this assay, and inadequate number of viral copies(<138 copies/mL). A negative result must be combined with clinical observations, patient history, and epidemiological information. The expected result is Negative.  Fact Sheet for Patients:  EntrepreneurPulse.com.au  Fact Sheet for Healthcare Providers:  IncredibleEmployment.be  This test is no t yet approved or cleared by the [place] FDA and  has been authorized for detection and/or diagnosis of SARS-CoV-2 by FDA under an Emergency Use Authorization (EUA). This EUA will remain  in effect (meaning this test can be used) for the duration of the COVID-19 declaration under Section 564(b)(1) of the Act, 21 U.S.C.section 360bbb-3(b)(1), unless the authorization is terminated  or revoked sooner.       Influenza A by PCR NEGATIVE NEGATIVE Final   Influenza B by PCR NEGATIVE NEGATIVE Final    Comment: (NOTE) The Xpert Xpress SARS-CoV-2/FLU/RSV plus assay is intended as an aid in the diagnosis of influenza from Nasopharyngeal swab specimens and should not be used as a sole basis for treatment. Nasal washings and aspirates are unacceptable for Xpert Xpress SARS-CoV-2/FLU/RSV testing.  Fact Sheet for Patients: EntrepreneurPulse.com.au  Fact Sheet for Healthcare Providers: IncredibleEmployment.be  This test is not yet approved or  cleared by the [place] FDA and has been authorized for detection and/or diagnosis of SARS-CoV-2 by FDA under an Emergency Use Authorization (EUA). This EUA will remain in effect (meaning this test can be used) for the duration of the COVID-19 declaration under Section 564(b)(1) of the Act, 21 U.S.C. section 360bbb-3(b)(1), unless the authorization is terminated or revoked.  Performed at [facility], [address] [place], [place] [address]   MRSA PCR Screening     Status: None   Collection Time: 09/23/20  9:33 AM   Specimen: Nasal Mucosa; Nasopharyngeal  Result Value Ref Range Status   MRSA by PCR NEGATIVE NEGATIVE Final    Comment:        The GeneXpert MRSA Assay (FDA approved for NASAL specimens only), is one component of a comprehensive MRSA colonization surveillance program. It is not intended to diagnose MRSA infection nor to guide or monitor treatment for MRSA infections. Performed at [facility], [address] [place], [place] [address]  Radiology Studies: CT ABDOMEN PELVIS WO CONTRAST  Addendum Date: 09/23/2020   ADDENDUM REPORT: 09/23/2020 03:32 ADDENDUM: Please note findings and impression should note cirrhosis with portal hypertension. Electronically Signed   By: Iven Finn M.D.   On: 09/23/2020 03:32   Result Date: 09/23/2020 CLINICAL DATA:  Nonlocalized acute abdominal pain. History of septic emboli in 2021. EXAM: CT ABDOMEN AND PELVIS WITHOUT CONTRAST TECHNIQUE: Multidetector CT imaging of the abdomen and pelvis was performed following the standard protocol without IV contrast. COMPARISON:  Chest x-ray 09/18/2018, CT abdomen pelvis 04/08/2013 FINDINGS: Lower chest: Subsegmental atelectasis with question of a 1.6 x 0.6 cm lesion within the right lower lobe. Question subpleural micronodule just superiorly (3:1). Subpleural pulmonary micronodule within the left lower lobe (3:6). 6 mm pulmonary nodule within the right middle lobe  (3:6). Subpleural nodule measuring 5 mm within the right middle lobe (3:8). Replace tricuspid valve.  Retained epicardial pacing wires. Hepatobiliary: The liver is enlarged in size measuring up to 24 cm. No focal liver abnormality. Question of gallbladder wall thickening and pericholecystic fluid. No CT findings gallbladder stones. No biliary dilatation. Pancreas: No focal lesion. Normal pancreatic contour. No surrounding inflammatory changes. No main pancreatic ductal dilatation. Spleen: The spleen is enlarged measuring up to 15.5 cm. Question inferior splenic lesion versus adjacent colon (5:56). Adrenals/Urinary Tract: No adrenal nodule bilaterally. No nephrolithiasis, no hydronephrosis, and no contour-deforming renal mass. No ureterolithiasis or hydroureter. The urinary bladder is unremarkable. Stomach/Bowel: Stomach is within normal limits. No evidence of bowel wall thickening or dilatation. No pneumatosis. Appendix appears normal. Vascular/Lymphatic: No abdominal aorta or iliac aneurysm. Mild atherosclerotic plaque of the aorta and its branches. No abdominal, pelvic, or inguinal lymphadenopathy. Reproductive: Uterus and bilateral adnexa/ovaries are unremarkable. Other: Trace free perihepatic fluid. Trace free pelvic fluid. No intraperitoneal free gas. No organized fluid collection. Musculoskeletal: Small left inguinal hernia containing fat and likely decompressed loop of small bowel. No suspicious lytic or blastic osseous lesions. No acute displaced fracture. 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 Fusion of the L5-S1 vertebral bodies. IMPRESSION: 1. Question gallbladder wall thickening and pericholecystic fluid. Recommend right upper quadrant ultrasound for a more sensitive evaluation. 2. Hepatosplenomegaly with question of an inferior splenic lesion versus adjacent small bowel. 3. Small left inguinal hernia containing fat and likely decompressed short  loop of small bowel. No definite associated bowel obstruction. Limited evaluation for ischemia on this noncontrast study with no definite pneumatosis identified. 4. Trace perihepatic and pelvic ascites. 5. Visualized lungs demonstrate multiple pulmonary nodules. Non-contrast chest CT at 3-6 months is recommended. If the nodules are stable at time of repeat CT, then future CT at 18-24 months (from today's scan) is considered optional for low-risk patients, but is recommended for high-risk patients. This recommendation follows the consensus statement: Guidelines for Management of Incidental Pulmonary Nodules Detected on CT Images: From the Fleischner Society 2017; Radiology 2017; 284:228-243. Electronically Signed: By: Iven Finn M.D. On: 09/23/2020 02:36   CT Head Wo Contrast  Result Date: 09/23/2020 CLINICAL DATA:  Delirium EXAM: CT HEAD WITHOUT CONTRAST TECHNIQUE: Contiguous axial images were obtained from the base of the skull through the vertex without intravenous contrast. COMPARISON:  None. FINDINGS: Brain: There is no mass, hemorrhage or extra-axial collection. The size and configuration of the ventricles and extra-axial CSF spaces are normal. The brain parenchyma is normal, without acute or chronic infarction. Vascular: No abnormal hyperdensity of the major intracranial arteries or dural venous sinuses. No intracranial  atherosclerosis. Skull: The visualized skull base, calvarium and extracranial soft tissues are normal. Sinuses/Orbits: No fluid levels or advanced mucosal thickening of the visualized paranasal sinuses. No mastoid or middle ear effusion. The orbits are normal. IMPRESSION: Normal head CT. Electronically Signed   By: Ulyses Jarred M.D.   On: 09/23/2020 03:49   DG Chest Port 1 View  Result Date: 09/23/2020 CLINICAL DATA:  45 year old female with fever and altered mental status. History of endocarditis, discitis. EXAM: PORTABLE CHEST 1 VIEW COMPARISON:  09/18/2019 portable chest and  earlier. FINDINGS: Portable AP upright view at 0805 hours. Extensive anterior and posterior lower cervical and upper thoracic spine fusion hardware appears stable. Prosthetic cardiac valve. Normal cardiac size and mediastinal contours. Left upper chest surgical clips. Stable lung volumes. No pneumothorax, pulmonary edema, pleural effusion. Regression of mild bilateral streaky opacity. No new pulmonary opacity. Paucity of bowel gas. IMPRESSION: 1. Improved bilateral ventilation and regressed bilateral likely infectious streaky and nodular opacity. 2. No new cardiopulmonary abnormality. Electronically Signed   By: Genevie Ann M.D.   On: 09/23/2020 08:49   US ABDOMEN LIMITED RUQ (LIVER/GB)  Result Date: 09/23/2020 CLINICAL DATA:  Pain.  Encephalopathy. EXAM: ULTRASOUND ABDOMEN LIMITED RIGHT UPPER QUADRANT COMPARISON:  CT abdomen pelvis 09/23/2020 FINDINGS: Gallbladder: Gallbladder sludge identified. Enlarged gallbladder wall measuring up to 6 mm. No pericholecystic fluid. No gallstones. No sonographic Murphy sign noted by sonographer. Common bile duct: Diameter: 2 mm. Liver: Nodular hepatic contour. No focal lesion identified. Coarsened heterogeneous parenchymal echogenicity. Portal vein is patent on color Doppler imaging with normal direction of blood flow towards the liver. Other: Increased renal parenchymal echogenicity. IMPRESSION: 1. Gallbladder sludge with gallbladder wall thickening which could be due to chronic liver disease in a patient with cirrhosis. No definite finding of acute cholecystitis or choledocholithiasis. 2. Increased renal parenchymal echogenicity suggestive of renal parenchymal disease. Electronically Signed   By: Iven Finn M.D.   On: 09/23/2020 03:35    Scheduled Meds: . LORazepam      . enoxaparin (LOVENOX) injection  40 mg Subcutaneous Q24H  . LORazepam  2 mg Intravenous Once  . metoCLOPramide (REGLAN) injection  10 mg Intravenous Q6H  . pantoprazole (PROTONIX) IV  40 mg  Intravenous Q12H   Continuous Infusions: . sodium chloride Stopped (09/23/20 0840)  . acetaminophen 1,000 mg (09/23/20 1230)  . cefTRIAXone (ROCEPHIN)  IV Stopped (09/23/20 0655)  . metronidazole 100 mL/hr at 09/23/20 1128     LOS: 0 days   Time spent: 40 minutes More than 50% of the time was spent in counseling/coordination of care  Lorella Nimrod, MD [facility]  If 7PM-7AM, please contact night-coverage [email or web address]  09/23/2020, 2:26 PM   This record has been created using Systems analyst. Errors have been sought and corrected,but may not always be located. Such creation errors do not reflect on the standard of care.

## 2020-09-23 NOTE — ED Notes (Signed)
Dr Nelson Chimes at bedside. 2mg  Ativan ordered for MRI.

## 2020-09-23 NOTE — Consult Note (Signed)
Andrea Minium, MD [facility]  [address], [address] [place], [place] [address] Phone: [phone] Fax : [phone]  Consultation  Referring Provider:     Dr. Arville Care Primary Care Physician:  Alberteen Spindle, CNM Primary Gastroenterologist: Gentry Fitz         Reason for Consultation:     SBP  Date of Admission:  09/23/2020 Date of Consultation:  09/23/2020         HPI:   Andrea King is a 45 y.o. female who is not able to give any history at the present time due to her confusion.  Patient has a history of hepatitis C and had presented for abdominal pain.  The patient was encephalopathic on presentation to the hospital and had reported not only abdominal pain but nausea and vomiting .  The patient was found to have a elevated white cell count on admission with the white cell count of 23.2 that has come down to 14.8.  The patient's hemoglobin on admission was around the baseline at 9.9 and today is 8.8.  The patient has had an elevated platelet count for some time but since admission the patient's platelet counts have been low with today's platelets of 111.  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  Patient had an ultrasound that showed:  IMPRESSION: 1. Gallbladder sludge with gallbladder wall thickening which could be due to chronic liver disease in a patient with cirrhosis. No definite finding of acute cholecystitis or choledocholithiasis. 2. Increased renal parenchymal echogenicity suggestive of renal parenchymal disease.  The patient had a CT scan of the abdomen that also reported in the addendum cirrhosis with portal hypertension.  The patient also had a normal CT of the head.  The patient was given the diagnosis of suspected spontaneous bacterial peritonitis despite the CT scan showing:  "Trace free perihepatic fluid. Trace free pelvic fluid. No intraperitoneal free gas. No organized fluid  collection."  Past Medical History:  Diagnosis Date  . Anemia   . Asthma   . DJD (degenerative joint disease)   . Hepatitis C   . Substance abuse [facility])     Past Surgical History:  Procedure Laterality Date  . INGUINAL HERNIA REPAIR Left 04/28/2013   Procedure: HERNIA REPAIR INGUINAL ADULT with mesh;  Surgeon: Cherylynn Ridges, MD;  Location: [facility] OR;  Service: General;  Laterality: Left;  . TEE WITHOUT CARDIOVERSION N/A 11/18/2015   Procedure: TRANSESOPHAGEAL ECHOCARDIOGRAM (TEE);  Surgeon: Iran Ouch, MD;  Location: [facility] ORS;  Service: Cardiovascular;  Laterality: N/A;  . TEE WITHOUT CARDIOVERSION N/A 12/31/2017   Procedure: TRANSESOPHAGEAL ECHOCARDIOGRAM (TEE);  Surgeon: Dalia Heading, MD;  Location: [facility] ORS;  Service: Cardiovascular;  Laterality: N/A;  . TEE WITHOUT CARDIOVERSION N/A 01/04/2018   Procedure: TRANSESOPHAGEAL ECHOCARDIOGRAM (TEE);  Surgeon: Dalia Heading, MD;  Location: [facility] ORS;  Service: Cardiovascular;  Laterality: N/A;  . tricuspid valve replacement    . TUMOR EXCISION     left scalpula    Prior to Admission medications   Medication Sig Start Date End Date Taking? Authorizing Provider  amoxicillin-clavulanate (AUGMENTIN) 875-125 MG tablet Take 1 tablet by mouth 2 (two) times daily. Patient not taking: Reported on 09/23/2020    [provider]  aspirin EC 81 MG tablet Take 81 mg by mouth daily. Patient not taking: Reported on 09/23/2020    [provider]  Buprenorphine HCl-Naloxone HCl 8-2 MG  FILM Place under the tongue. Patient not taking: Reported on 09/23/2020    [provider]  doxycycline (VIBRAMYCIN) 100 MG capsule Take 100 mg by mouth 2 (two) times daily. Patient not taking: Reported on 09/23/2020    [provider]  gabapentin (NEURONTIN) 300 MG capsule Take 300 mg by mouth 3 (three) times daily. Take 2 caps in the AM, midday, and 3 caps at night Patient not taking: Reported on 09/23/2020    [provider]   hydrOXYzine (VISTARIL) 25 MG capsule Take 25 mg by mouth 4 (four) times daily. Patient not taking: Reported on 09/23/2020    [provider]    Family History  Problem Relation Age of Onset  . Cancer Mother   . Cancer - Colon Maternal Grandfather   . Diabetes Maternal Grandfather      Social History   Tobacco Use  . Smoking status: Current Every Day Smoker    Types: Cigarettes  . Smokeless tobacco: Never Used  Substance Use Topics  . Alcohol use: No    Alcohol/week: 0.0 standard drinks  . Drug use: Yes    Comment: cocaine,Heroine    Allergies as of 09/23/2020  . (No Known Allergies)    Review of Systems:    All systems reviewed and negative except where noted in HPI.   Physical Exam:  Vital signs in last 24 hours: Temp:  [97.7 F (36.5 C)-105.3 F (40.7 C)] 97.9 F (36.6 C) (05/23 1400) Pulse Rate:  [96-150] 96 (05/23 1500) Resp:  [18-35] 20 (05/23 1500) BP: (92-123)/(53-70) 92/66 (05/23 1500) SpO2:  [95 %-100 %] 100 % (05/23 1500) Weight:  [48.7 kg-54.4 kg] 48.7 kg (05/23 0845) Last BM Date:  (PTA) General:   Obtunded Head:  Normocephalic and atraumatic. Eyes:   No icterus.   Conjunctiva pink. PERRLA. Ears:  Normal auditory acuity. Neck:  Supple; no masses or thyroidomegaly Lungs: Respirations even and unlabored. Lungs clear to auscultation bilaterally.   No wheezes, crackles, or rhonchi.  Heart:  Regular rate and rhythm;  Without murmur, clicks, rubs or gallops Abdomen:  Soft, nondistended, nontender. Normal bowel sounds. No appreciable masses or hepatomegaly.  No rebound or guarding.  Rectal:  Not performed. Msk:  Symmetrical without gross deformities.   Extremities:  Without edema, cyanosis or clubbing. Neurologic: Unable to assess Skin:  Intact without significant lesions or rashes. Cervical Nodes:  No significant cervical adenopathy. Psych: Obtunded.  LAB RESULTS: Recent Labs    09/23/20 0043 09/23/20 0616 09/23/20 0744  WBC 23.2* 16.6*  16.7*  HGB 9.9* 7.8* 9.1*  HCT 30.9* 23.8* 27.8*  PLT 190 137* 141*   BMET Recent Labs    09/23/20 0043 09/23/20 0616  NA 135 134*  K 3.8 3.4*  CL 100 99  CO2 24 22  GLUCOSE 130* 142*  BUN 11 14  CREATININE 0.77 0.94  CALCIUM 8.8* 8.1*   LFT Recent Labs    09/23/20 0616  PROT 7.0  ALBUMIN 2.7*  AST 27  ALT 12  ALKPHOS 96  BILITOT 0.6   PT/INR Recent Labs    09/23/20 0616 09/23/20 0744  LABPROT 27.2* 15.9*  INR 2.5* 1.3*    STUDIES: CT ABDOMEN PELVIS WO CONTRAST  Addendum Date: 09/23/2020   ADDENDUM REPORT: 09/23/2020 03:32 ADDENDUM: Please note findings and impression should note cirrhosis with portal hypertension. Electronically Signed   By: Tish Frederickson M.D.   On: 09/23/2020 03:32   Result Date: 09/23/2020 CLINICAL DATA:  Nonlocalized acute abdominal pain. History  of septic emboli in 2021. EXAM: CT ABDOMEN AND PELVIS WITHOUT CONTRAST TECHNIQUE: Multidetector CT imaging of the abdomen and pelvis was performed following the standard protocol without IV contrast. COMPARISON:  Chest x-ray 09/18/2018, CT abdomen pelvis 04/08/2013 FINDINGS: Lower chest: Subsegmental atelectasis with question of a 1.6 x 0.6 cm lesion within the right lower lobe. Question subpleural micronodule just superiorly (3:1). Subpleural pulmonary micronodule within the left lower lobe (3:6). 6 mm pulmonary nodule within the right middle lobe (3:6). Subpleural nodule measuring 5 mm within the right middle lobe (3:8). Replace tricuspid valve.  Retained epicardial pacing wires. Hepatobiliary: The liver is enlarged in size measuring up to 24 cm. No focal liver abnormality. Question of gallbladder wall thickening and pericholecystic fluid. No CT findings gallbladder stones. No biliary dilatation. Pancreas: No focal lesion. Normal pancreatic contour. No surrounding inflammatory changes. No main pancreatic ductal dilatation. Spleen: The spleen is enlarged measuring up to 15.5 cm. Question inferior splenic  lesion versus adjacent colon (5:56). Adrenals/Urinary Tract: No adrenal nodule bilaterally. No nephrolithiasis, no hydronephrosis, and no contour-deforming renal mass. No ureterolithiasis or hydroureter. The urinary bladder is unremarkable. Stomach/Bowel: Stomach is within normal limits. No evidence of bowel wall thickening or dilatation. No pneumatosis. Appendix appears normal. Vascular/Lymphatic: No abdominal aorta or iliac aneurysm. Mild atherosclerotic plaque of the aorta and its branches. No abdominal, pelvic, or inguinal lymphadenopathy. Reproductive: Uterus and bilateral adnexa/ovaries are unremarkable. Other: Trace free perihepatic fluid. Trace free pelvic fluid. No intraperitoneal free gas. No organized fluid collection. Musculoskeletal: Small left inguinal hernia containing fat and likely decompressed loop of small bowel. No suspicious lytic or blastic osseous lesions. No acute displaced fracture. 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 Fusion of the L5-S1 vertebral bodies. IMPRESSION: 1. Question gallbladder wall thickening and pericholecystic fluid. Recommend right upper quadrant ultrasound for a more sensitive evaluation. 2. Hepatosplenomegaly with question of an inferior splenic lesion versus adjacent small bowel. 3. Small left inguinal hernia containing fat and likely decompressed short loop of small bowel. No definite associated bowel obstruction. Limited evaluation for ischemia on this noncontrast study with no definite pneumatosis identified. 4. Trace perihepatic and pelvic ascites. 5. Visualized lungs demonstrate multiple pulmonary nodules. Non-contrast chest CT at 3-6 months is recommended. If the nodules are stable at time of repeat CT, then future CT at 18-24 months (from today's scan) is considered optional for low-risk patients, but is recommended for high-risk patients. This recommendation follows the consensus statement: Guidelines for  Management of Incidental Pulmonary Nodules Detected on CT Images: From the Fleischner Society 2017; Radiology 2017; 284:228-243. Electronically Signed: By: Tish Frederickson M.D. On: 09/23/2020 02:36   CT Head Wo Contrast  Result Date: 09/23/2020 CLINICAL DATA:  Delirium EXAM: CT HEAD WITHOUT CONTRAST TECHNIQUE: Contiguous axial images were obtained from the base of the skull through the vertex without intravenous contrast. COMPARISON:  None. FINDINGS: Brain: There is no mass, hemorrhage or extra-axial collection. The size and configuration of the ventricles and extra-axial CSF spaces are normal. The brain parenchyma is normal, without acute or chronic infarction. Vascular: No abnormal hyperdensity of the major intracranial arteries or dural venous sinuses. No intracranial atherosclerosis. Skull: The visualized skull base, calvarium and extracranial soft tissues are normal. Sinuses/Orbits: No fluid levels or advanced mucosal thickening of the visualized paranasal sinuses. No mastoid or middle ear effusion. The orbits are normal. IMPRESSION: Normal head CT. Electronically Signed   By: Deatra Robinson M.D.   On: 09/23/2020 03:49  DG Chest Port 1 View  Result Date: 09/23/2020 CLINICAL DATA:  45 year old female with fever and altered mental status. History of endocarditis, discitis. EXAM: PORTABLE CHEST 1 VIEW COMPARISON:  09/18/2019 portable chest and earlier. FINDINGS: Portable AP upright view at 0805 hours. Extensive anterior and posterior lower cervical and upper thoracic spine fusion hardware appears stable. Prosthetic cardiac valve. Normal cardiac size and mediastinal contours. Left upper chest surgical clips. Stable lung volumes. No pneumothorax, pulmonary edema, pleural effusion. Regression of mild bilateral streaky opacity. No new pulmonary opacity. Paucity of bowel gas. IMPRESSION: 1. Improved bilateral ventilation and regressed bilateral likely infectious streaky and nodular opacity. 2. No new  cardiopulmonary abnormality. Electronically Signed   By: Odessa FlemingH  Hall M.D.   On: 09/23/2020 08:49   US ABDOMEN LIMITED RUQ (LIVER/GB)  Result Date: 09/23/2020 CLINICAL DATA:  Pain.  Encephalopathy. EXAM: ULTRASOUND ABDOMEN LIMITED RIGHT UPPER QUADRANT COMPARISON:  CT abdomen pelvis 09/23/2020 FINDINGS: Gallbladder: Gallbladder sludge identified. Enlarged gallbladder wall measuring up to 6 mm. No pericholecystic fluid. No gallstones. No sonographic Murphy sign noted by sonographer. Common bile duct: Diameter: 2 mm. Liver: Nodular hepatic contour. No focal lesion identified. Coarsened heterogeneous parenchymal echogenicity. Portal vein is patent on color Doppler imaging with normal direction of blood flow towards the liver. Other: Increased renal parenchymal echogenicity. IMPRESSION: 1. Gallbladder sludge with gallbladder wall thickening which could be due to chronic liver disease in a patient with cirrhosis. No definite finding of acute cholecystitis or choledocholithiasis. 2. Increased renal parenchymal echogenicity suggestive of renal parenchymal disease. Electronically Signed   By: Tish FredericksonMorgane  Naveau M.D.   On: 09/23/2020 03:35      Impression / Plan:   Assessment: Active Problems:   SBP (spontaneous bacterial peritonitis) (HCC)   Encephalopathy   Altered mental status   Generalized abdominal pain   Intractable vomiting   Fever   Andrea King is a 45 y.o. y/o female with change in mental status and suspected spontaneous bacterial peritonitis on admission.  I have reviewed the hospitalist note from today and agree that acute metabolic encephalopathy is more likely than this patient having spontaneous bacterial peritonitis with very little fluid in the abdomen seen on imaging.  I also agree with the assessment that sepsis is likely and finding the source of sepsis is currently underway.  The patient has already been started on antibiotics and the white cell count is responded.  Plan:  Despite this  patient having cirrhosis there is very little fluid seen on the CT scan and although possible it is unlikely that this patient has spontaneous bacterial peritonitis.  Wherever the infection may be the patient is now responding to antibiotics with a lowering of the white cell count although the patient's mental status has not improved.  I agree with continued supportive care.  At this point the patient does not need to be treated for her hepatitis C or any further evaluation for the hepatitis C while an inpatient and should follow-up as an outpatient.  Thank you for involving me in the care of this patient.      LOS: 0 days   Andrea Miniumarren Kendon Sedeno, MD, [facility] 09/23/2020, 3:43 PM,  Pager [phone] 7am-5pm  Check AMION for 5pm -7am coverage and on weekends   Note: This dictation was prepared with Dragon dictation along with smaller phrase technology. Any transcriptional errors that result from this process are unintentional.

## 2020-09-23 NOTE — ED Notes (Addendum)
Pt actively vomiting in triage on floor, pt keeps getting up to walk around in triage, won't hold emesis bag up to vomit in.

## 2020-09-24 ENCOUNTER — Inpatient Hospital Stay: Payer: Medicaid Other

## 2020-09-24 ENCOUNTER — Inpatient Hospital Stay (HOSPITAL_COMMUNITY)
Admit: 2020-09-24 | Discharge: 2020-09-24 | Disposition: A | Discharge status: Home / Self Care | Payer: Medicaid Other | Attending: Pulmonary Disease | Admitting: Pulmonary Disease

## 2020-09-24 DIAGNOSIS — G9340 Encephalopathy, unspecified: Secondary | ICD-10-CM

## 2020-09-24 DIAGNOSIS — J9601 Acute respiratory failure with hypoxia: Secondary | ICD-10-CM | POA: Diagnosis not present

## 2020-09-24 DIAGNOSIS — R4182 Altered mental status, unspecified: Secondary | ICD-10-CM

## 2020-09-24 DIAGNOSIS — R4020 Unspecified coma: Secondary | ICD-10-CM

## 2020-09-24 DIAGNOSIS — I38 Endocarditis, valve unspecified: Secondary | ICD-10-CM

## 2020-09-24 LAB — BLOOD GAS, ARTERIAL
Acid-base deficit: 6.7 mmol/L — ABNORMAL HIGH (ref 0.0–2.0)
Acid-base deficit: 7.8 mmol/L — ABNORMAL HIGH (ref 0.0–2.0)
Bicarbonate: 22.3 mmol/L (ref 20.0–28.0)
Bicarbonate: 20.3 mmol/L (ref 20.0–28.0)
FIO2: 1
FIO2: 0.75
MECHVT: 400 mL
MECHVT: 400 mL
Mechanical Rate: 16
Mechanical Rate: 20
O2 Saturation: 99.9 %
O2 Saturation: 99.7 %
PEEP: 8 cmH2O
PEEP: 5 cmH2O
Patient temperature: 37
Patient temperature: 37
RATE: 16 resp/min
RATE: 20 resp/min
pCO2 arterial: 61 mmHg — ABNORMAL HIGH (ref 32.0–48.0)
pCO2 arterial: 52 mmHg — ABNORMAL HIGH (ref 32.0–48.0)
pH, Arterial: 7.17 — CL (ref 7.350–7.450)
pH, Arterial: 7.2 — ABNORMAL LOW (ref 7.350–7.450)
pO2, Arterial: 311 mmHg — ABNORMAL HIGH (ref 83.0–108.0)
pO2, Arterial: 241 mmHg — ABNORMAL HIGH (ref 83.0–108.0)

## 2020-09-24 LAB — GLUCOSE, CAPILLARY
Glucose-Capillary: 131 mg/dL — ABNORMAL HIGH (ref 70–99)
Glucose-Capillary: 123 mg/dL — ABNORMAL HIGH (ref 70–99)
Glucose-Capillary: 125 mg/dL — ABNORMAL HIGH (ref 70–99)
Glucose-Capillary: 108 mg/dL — ABNORMAL HIGH (ref 70–99)
Glucose-Capillary: 99 mg/dL (ref 70–99)
Glucose-Capillary: 129 mg/dL — ABNORMAL HIGH (ref 70–99)
Glucose-Capillary: 186 mg/dL — ABNORMAL HIGH (ref 70–99)

## 2020-09-24 LAB — MAGNESIUM
Magnesium: 1.4 mg/dL — ABNORMAL LOW (ref 1.7–2.4)
Magnesium: 2.6 mg/dL — ABNORMAL HIGH (ref 1.7–2.4)

## 2020-09-24 LAB — CBC
HCT: 24.7 % — ABNORMAL LOW (ref 36.0–46.0)
Hemoglobin: 8.2 g/dL — ABNORMAL LOW (ref 12.0–15.0)
MCH: 26 pg (ref 26.0–34.0)
MCHC: 33.2 g/dL (ref 30.0–36.0)
MCV: 78.4 fL — ABNORMAL LOW (ref 80.0–100.0)
Platelets: 76 10*3/uL — ABNORMAL LOW (ref 150–400)
RBC: 3.15 MIL/uL — ABNORMAL LOW (ref 3.87–5.11)
RDW: 14.9 % (ref 11.5–15.5)
WBC: 11.1 10*3/uL — ABNORMAL HIGH (ref 4.0–10.5)
nRBC: 0 % (ref 0.0–0.2)

## 2020-09-24 LAB — BRAIN NATRIURETIC PEPTIDE: B Natriuretic Peptide: 737.1 pg/mL — ABNORMAL HIGH (ref 0.0–100.0)

## 2020-09-24 LAB — LACTIC ACID, PLASMA
Lactic Acid, Venous: 1.1 mmol/L (ref 0.5–1.9)
Lactic Acid, Venous: 1 mmol/L (ref 0.5–1.9)
Lactic Acid, Venous: 1.6 mmol/L (ref 0.5–1.9)
Lactic Acid, Venous: 1.5 mmol/L (ref 0.5–1.9)
Lactic Acid, Venous: 2 mmol/L (ref 0.5–1.9)
Lactic Acid, Venous: 2.2 mmol/L (ref 0.5–1.9)

## 2020-09-24 LAB — FIBRINOGEN: Fibrinogen: 468 mg/dL (ref 210–475)

## 2020-09-24 LAB — URINALYSIS, ROUTINE W REFLEX MICROSCOPIC
Bacteria, UA: NONE SEEN
Bilirubin Urine: NEGATIVE
Glucose, UA: NEGATIVE mg/dL
Ketones, ur: NEGATIVE mg/dL
Nitrite: NEGATIVE
Protein, ur: 30 mg/dL — AB
RBC / HPF: >50 RBC/hpf — ABNORMAL HIGH (ref 0–5)
Specific Gravity, Urine: 1.018 (ref 1.005–1.030)
pH: 8 (ref 5.0–8.0)

## 2020-09-24 LAB — COMPREHENSIVE METABOLIC PANEL
ALT: 11 U/L (ref 0–44)
ALT: 10 U/L (ref 0–44)
AST: 19 U/L (ref 15–41)
AST: 16 U/L (ref 15–41)
Albumin: 2.7 g/dL — ABNORMAL LOW (ref 3.5–5.0)
Albumin: 2.8 g/dL — ABNORMAL LOW (ref 3.5–5.0)
Alkaline Phosphatase: 81 U/L (ref 38–126)
Alkaline Phosphatase: 71 U/L (ref 38–126)
Anion gap: 8 (ref 5–15)
Anion gap: 9 (ref 5–15)
BUN: 17 mg/dL (ref 6–20)
BUN: 16 mg/dL (ref 6–20)
CO2: 21 mmol/L — ABNORMAL LOW (ref 22–32)
CO2: 22 mmol/L (ref 22–32)
Calcium: 7.5 mg/dL — ABNORMAL LOW (ref 8.9–10.3)
Calcium: 7.6 mg/dL — ABNORMAL LOW (ref 8.9–10.3)
Chloride: 102 mmol/L (ref 98–111)
Chloride: 104 mmol/L (ref 98–111)
Creatinine, Ser: 0.84 mg/dL (ref 0.44–1.00)
Creatinine, Ser: 0.73 mg/dL (ref 0.44–1.00)
GFR, Estimated: >60 mL/min (ref >60)
GFR, Estimated: >60 mL/min (ref >60)
Glucose, Bld: 133 mg/dL — ABNORMAL HIGH (ref 70–99)
Glucose, Bld: 127 mg/dL — ABNORMAL HIGH (ref 70–99)
Potassium: 2.8 mmol/L — ABNORMAL LOW (ref 3.5–5.1)
Potassium: 3.5 mmol/L (ref 3.5–5.1)
Sodium: 131 mmol/L — ABNORMAL LOW (ref 135–145)
Sodium: 135 mmol/L (ref 135–145)
Total Bilirubin: 0.9 mg/dL (ref 0.3–1.2)
Total Bilirubin: 0.8 mg/dL (ref 0.3–1.2)
Total Protein: 6.7 g/dL (ref 6.5–8.1)
Total Protein: 6.4 g/dL — ABNORMAL LOW (ref 6.5–8.1)

## 2020-09-24 LAB — CBC WITH DIFFERENTIAL/PLATELET
Abs Immature Granulocytes: 0.23 10*3/uL — ABNORMAL HIGH (ref 0.00–0.07)
Basophils Absolute: 0 10*3/uL (ref 0.0–0.1)
Basophils Relative: 0 %
Eosinophils Absolute: 0 10*3/uL (ref 0.0–0.5)
Eosinophils Relative: 0 %
HCT: 27.1 % — ABNORMAL LOW (ref 36.0–46.0)
Hemoglobin: 8.8 g/dL — ABNORMAL LOW (ref 12.0–15.0)
Immature Granulocytes: 2 %
Lymphocytes Relative: 4 %
Lymphs Abs: 0.5 10*3/uL — ABNORMAL LOW (ref 0.7–4.0)
MCH: 26.1 pg (ref 26.0–34.0)
MCHC: 32.5 g/dL (ref 30.0–36.0)
MCV: 80.4 fL (ref 80.0–100.0)
Monocytes Absolute: 0.6 10*3/uL (ref 0.1–1.0)
Monocytes Relative: 4 %
Neutro Abs: 12.3 10*3/uL — ABNORMAL HIGH (ref 1.7–7.7)
Neutrophils Relative %: 90 %
Platelets: 88 10*3/uL — ABNORMAL LOW (ref 150–400)
RBC: 3.37 MIL/uL — ABNORMAL LOW (ref 3.87–5.11)
RDW: 14.6 % (ref 11.5–15.5)
WBC: 13.6 10*3/uL — ABNORMAL HIGH (ref 4.0–10.5)
nRBC: 0 % (ref 0.0–0.2)

## 2020-09-24 LAB — TROPONIN I (HIGH SENSITIVITY)
Troponin I (High Sensitivity): 52 ng/L — ABNORMAL HIGH
Troponin I (High Sensitivity): 26 ng/L — ABNORMAL HIGH (ref <18)

## 2020-09-24 LAB — D-DIMER, QUANTITATIVE: D-Dimer, Quant: 8.09 ug/mL-FEU — ABNORMAL HIGH (ref 0.00–0.50)

## 2020-09-24 LAB — C-REACTIVE PROTEIN: CRP: 20.4 mg/dL — ABNORMAL HIGH (ref <1.0)

## 2020-09-24 LAB — STREP PNEUMONIAE URINARY ANTIGEN: Strep Pneumo Urinary Antigen: NEGATIVE

## 2020-09-24 LAB — CK: Total CK: 10 U/L — ABNORMAL LOW (ref 38–234)

## 2020-09-24 LAB — SAVE SMEAR(SSMR), FOR PROVIDER SLIDE REVIEW

## 2020-09-24 LAB — PROTIME-INR
INR: 1.4 — ABNORMAL HIGH (ref 0.8–1.2)
Prothrombin Time: 17.6 seconds — ABNORMAL HIGH (ref 11.4–15.2)

## 2020-09-24 LAB — APTT: aPTT: 42 seconds — ABNORMAL HIGH (ref 24–36)

## 2020-09-24 LAB — PATHOLOGIST SMEAR REVIEW

## 2020-09-24 MED ORDER — ALBUMIN HUMAN 25 % IV SOLN
25.0000 g | Freq: Once | INTRAVENOUS | Status: AC
  Administered 2020-09-24: 25 g via INTRAVENOUS
  Filled 2020-09-24: qty 100

## 2020-09-24 MED ORDER — FENTANYL 2500MCG IN NS 250ML (10MCG/ML) PREMIX INFUSION
0.0000 ug/h | INTRAVENOUS | Status: DC
  Administered 2020-09-24: 300 ug/h via INTRAVENOUS
  Administered 2020-09-25 (×2): 400 ug/h via INTRAVENOUS
  Filled 2020-09-24 (×3): qty 250

## 2020-09-24 MED ORDER — PROPOFOL 1000 MG/100ML IV EMUL
INTRAVENOUS | Status: AC
  Administered 2020-09-24: 40 ug/kg/min via INTRAVENOUS
  Filled 2020-09-24: qty 100

## 2020-09-24 MED ORDER — DEXMEDETOMIDINE HCL IN NACL 400 MCG/100ML IV SOLN
0.4000 ug/kg/h | INTRAVENOUS | Status: DC
  Administered 2020-09-24: 0.6 ug/kg/h via INTRAVENOUS
  Administered 2020-09-24: 0.9 ug/kg/h via INTRAVENOUS
  Filled 2020-09-24: qty 100

## 2020-09-24 MED ORDER — ORAL CARE MOUTH RINSE
15.0000 mL | OROMUCOSAL | Status: DC
  Administered 2020-09-24 – 2020-09-29 (×48): 15 mL via OROMUCOSAL

## 2020-09-24 MED ORDER — LABETALOL HCL 5 MG/ML IV SOLN
INTRAVENOUS | Status: AC
  Filled 2020-09-24: qty 4

## 2020-09-24 MED ORDER — MIDAZOLAM HCL 2 MG/2ML IJ SOLN
INTRAMUSCULAR | Status: AC
  Filled 2020-09-24: qty 2

## 2020-09-24 MED ORDER — POLYETHYLENE GLYCOL 3350 17 G PO PACK
17.0000 g | PACK | Freq: Every day | ORAL | Status: DC
  Administered 2020-09-25 – 2020-09-29 (×4): 17 g
  Filled 2020-09-24 (×4): qty 1

## 2020-09-24 MED ORDER — ROCURONIUM BROMIDE 50 MG/5ML IV SOLN
INTRAVENOUS | Status: AC
  Filled 2020-09-24: qty 1

## 2020-09-24 MED ORDER — FENTANYL 2500MCG IN NS 250ML (10MCG/ML) PREMIX INFUSION
INTRAVENOUS | Status: AC
  Administered 2020-09-24: 100 ug/h via INTRAVENOUS
  Filled 2020-09-24: qty 250

## 2020-09-24 MED ORDER — FENTANYL CITRATE (PF) 100 MCG/2ML IJ SOLN
INTRAMUSCULAR | Status: AC
  Filled 2020-09-24: qty 2

## 2020-09-24 MED ORDER — MORPHINE SULFATE (PF) 2 MG/ML IV SOLN
INTRAVENOUS | Status: AC
  Administered 2020-09-24: 1 mg via INTRAVENOUS
  Filled 2020-09-24: qty 1

## 2020-09-24 MED ORDER — VECURONIUM BROMIDE 10 MG IV SOLR
INTRAVENOUS | Status: AC
  Filled 2020-09-24: qty 10

## 2020-09-24 MED ORDER — VANCOMYCIN HCL 1000 MG/200ML IV SOLN
1000.0000 mg | Freq: Once | INTRAVENOUS | Status: AC
  Administered 2020-09-24: 1000 mg via INTRAVENOUS
  Filled 2020-09-24: qty 200

## 2020-09-24 MED ORDER — NOREPINEPHRINE 16 MG/250ML-% IV SOLN
0.0000 ug/min | INTRAVENOUS | Status: DC
  Administered 2020-09-25: 10 ug/min via INTRAVENOUS
  Filled 2020-09-24: qty 250

## 2020-09-24 MED ORDER — VASOPRESSIN 20 UNITS/100 ML INFUSION FOR SHOCK
0.0000 [IU]/min | INTRAVENOUS | Status: DC
  Administered 2020-09-24: 0.03 [IU]/min via INTRAVENOUS
  Administered 2020-09-25: 0.02 [IU]/min via INTRAVENOUS
  Filled 2020-09-24 (×2): qty 100

## 2020-09-24 MED ORDER — VECURONIUM BROMIDE 10 MG IV SOLR
10.0000 mg | Freq: Once | INTRAVENOUS | Status: AC
  Administered 2020-09-24: 10 mg via INTRAVENOUS

## 2020-09-24 MED ORDER — HYDROCORTISONE NA SUCCINATE PF 100 MG IJ SOLR
50.0000 mg | Freq: Once | INTRAMUSCULAR | Status: AC
  Administered 2020-09-24: 50 mg via INTRAVENOUS
  Filled 2020-09-24: qty 2

## 2020-09-24 MED ORDER — FENTANYL CITRATE (PF) 100 MCG/2ML IJ SOLN
100.0000 ug | Freq: Once | INTRAMUSCULAR | Status: AC
  Administered 2020-09-24: 100 ug via INTRAVENOUS

## 2020-09-24 MED ORDER — SODIUM CHLORIDE 0.9 % IV SOLN
100.0000 mg | INTRAVENOUS | Status: DC
  Administered 2020-09-24 – 2020-09-26 (×2): 100 mg via INTRAVENOUS
  Filled 2020-09-24 (×3): qty 100

## 2020-09-24 MED ORDER — MIDAZOLAM 50MG/50ML (1MG/ML) PREMIX INFUSION
0.5000 mg/h | INTRAVENOUS | Status: DC
  Administered 2020-09-24: 6 mg/h via INTRAVENOUS
  Administered 2020-09-24: 2 mg/h via INTRAVENOUS
  Administered 2020-09-25: 7 mg/h via INTRAVENOUS
  Administered 2020-09-25 (×2): 10 mg/h via INTRAVENOUS
  Administered 2020-09-25: 8 mg/h via INTRAVENOUS
  Administered 2020-09-26: 10 mg/h via INTRAVENOUS
  Administered 2020-09-26: 9 mg/h via INTRAVENOUS
  Administered 2020-09-26 – 2020-09-27 (×6): 10 mg/h via INTRAVENOUS
  Administered 2020-09-27: 7 mg/h via INTRAVENOUS
  Administered 2020-09-27 – 2020-09-29 (×7): 10 mg/h via INTRAVENOUS
  Filled 2020-09-24 (×22): qty 50

## 2020-09-24 MED ORDER — PHENYLEPHRINE CONCENTRATED 100MG/250ML (0.4 MG/ML) INFUSION SIMPLE
0.0000 ug/min | INTRAVENOUS | Status: DC
Start: 2020-09-24 — End: 2020-09-26
  Administered 2020-09-24: 160 ug/min via INTRAVENOUS
  Administered 2020-09-24: 20 ug/min via INTRAVENOUS
  Filled 2020-09-24 (×3): qty 250

## 2020-09-24 MED ORDER — SODIUM CHLORIDE 0.9 % IV SOLN
200.0000 mg | Freq: Once | INTRAVENOUS | Status: AC
  Administered 2020-09-24: 200 mg via INTRAVENOUS
  Filled 2020-09-24: qty 200

## 2020-09-24 MED ORDER — PROPOFOL 1000 MG/100ML IV EMUL: 5.0000 ug/kg/min | INTRAVENOUS | Status: DC

## 2020-09-24 MED ORDER — ETOMIDATE 2 MG/ML IV SOLN
20.0000 mg | Freq: Once | INTRAVENOUS | Status: AC
  Administered 2020-09-24: 20 mg via INTRAVENOUS
  Filled 2020-09-24: qty 10

## 2020-09-24 MED ORDER — MIDAZOLAM HCL 2 MG/2ML IJ SOLN
2.0000 mg | INTRAMUSCULAR | Status: DC | PRN
  Administered 2020-09-24 – 2020-09-29 (×8): 2 mg via INTRAVENOUS
  Filled 2020-09-24 (×4): qty 2

## 2020-09-24 MED ORDER — LABETALOL HCL 5 MG/ML IV SOLN
10.0000 mg | Freq: Once | INTRAVENOUS | Status: AC
  Administered 2020-09-24: 10 mg via INTRAVENOUS

## 2020-09-24 MED ORDER — DEXMEDETOMIDINE HCL IN NACL 400 MCG/100ML IV SOLN
0.4000 ug/kg/h | INTRAVENOUS | Status: DC
  Administered 2020-09-24: 0.4 ug/kg/h via INTRAVENOUS

## 2020-09-24 MED ORDER — HYDROCORTISONE NA SUCCINATE PF 100 MG IJ SOLR
50.0000 mg | Freq: Four times a day (QID) | INTRAMUSCULAR | Status: DC
  Administered 2020-09-24 – 2020-09-29 (×20): 50 mg via INTRAVENOUS
  Filled 2020-09-24 (×20): qty 2

## 2020-09-24 MED ORDER — POTASSIUM CHLORIDE 10 MEQ/100ML IV SOLN
10.0000 meq | INTRAVENOUS | Status: AC
  Administered 2020-09-24 (×6): 10 meq via INTRAVENOUS
  Filled 2020-09-24 (×6): qty 100

## 2020-09-24 MED ORDER — MIDAZOLAM HCL 2 MG/2ML IJ SOLN
2.0000 mg | INTRAMUSCULAR | Status: AC | PRN
  Administered 2020-09-24 – 2020-09-26 (×3): 2 mg via INTRAVENOUS

## 2020-09-24 MED ORDER — SODIUM CHLORIDE 0.9 % IV SOLN
2.0000 g | Freq: Two times a day (BID) | INTRAVENOUS | Status: DC
  Administered 2020-09-24 – 2020-09-27 (×6): 2 g via INTRAVENOUS
  Filled 2020-09-24: qty 2
  Filled 2020-09-24: qty 20
  Filled 2020-09-24 (×2): qty 2
  Filled 2020-09-24 (×2): qty 20
  Filled 2020-09-24: qty 2

## 2020-09-24 MED ORDER — MAGNESIUM SULFATE 4 GM/100ML IV SOLN
4.0000 g | Freq: Once | INTRAVENOUS | Status: AC
  Administered 2020-09-24: 4 g via INTRAVENOUS
  Filled 2020-09-24: qty 100

## 2020-09-24 MED ORDER — MORPHINE SULFATE (PF) 2 MG/ML IV SOLN: 1.0000 mg | Freq: Once | INTRAVENOUS | Status: AC

## 2020-09-24 MED ORDER — LACTATED RINGERS IV BOLUS
500.0000 mL | Freq: Once | INTRAVENOUS | Status: AC
  Administered 2020-09-24: 500 mL via INTRAVENOUS

## 2020-09-24 MED ORDER — ROCURONIUM BROMIDE 50 MG/5ML IV SOLN
50.0000 mg | Freq: Once | INTRAVENOUS | Status: AC
  Administered 2020-09-24: 50 mg via INTRAVENOUS

## 2020-09-24 MED ORDER — DEXTROSE 5 % IV SOLN
10.0000 mg/kg | Freq: Three times a day (TID) | INTRAVENOUS | Status: DC
  Administered 2020-09-24 – 2020-09-27 (×10): 485 mg via INTRAVENOUS
  Filled 2020-09-24 (×11): qty 9.7

## 2020-09-24 MED ORDER — ESMOLOL HCL-SODIUM CHLORIDE 2000 MG/100ML IV SOLN
25.0000 ug/kg/min | INTRAVENOUS | Status: DC
  Filled 2020-09-24: qty 100

## 2020-09-24 MED ORDER — DOCUSATE SODIUM 50 MG/5ML PO LIQD
100.0000 mg | Freq: Two times a day (BID) | ORAL | Status: DC
  Administered 2020-09-25 – 2020-09-29 (×8): 100 mg
  Filled 2020-09-24 (×8): qty 10

## 2020-09-24 MED ORDER — ACETAMINOPHEN 10 MG/ML IV SOLN
1000.0000 mg | Freq: Four times a day (QID) | INTRAVENOUS | Status: DC
  Filled 2020-09-24 (×2): qty 100

## 2020-09-24 MED ORDER — LABETALOL HCL 5 MG/ML IV SOLN: 10.0000 mg | INTRAVENOUS | Status: DC | PRN

## 2020-09-24 MED ORDER — LACTATED RINGERS IV BOLUS
1000.0000 mL | Freq: Once | INTRAVENOUS | Status: AC
  Administered 2020-09-24: 1000 mL via INTRAVENOUS

## 2020-09-24 MED ORDER — CHLORHEXIDINE GLUCONATE 0.12% ORAL RINSE (MEDLINE KIT)
15.0000 mL | Freq: Two times a day (BID) | OROMUCOSAL | Status: DC
  Administered 2020-09-24 – 2020-09-29 (×11): 15 mL via OROMUCOSAL

## 2020-09-24 MED ORDER — SODIUM CHLORIDE 0.9 % IV SOLN
2.0000 g | INTRAVENOUS | Status: DC
  Administered 2020-09-24 – 2020-09-26 (×13): 2 g via INTRAVENOUS
  Filled 2020-09-24: qty 2000
  Filled 2020-09-24: qty 2
  Filled 2020-09-24: qty 2000
  Filled 2020-09-24 (×5): qty 2
  Filled 2020-09-24: qty 2000
  Filled 2020-09-24 (×6): qty 2
  Filled 2020-09-24: qty 2000

## 2020-09-24 MED ORDER — VANCOMYCIN HCL 500 MG/100ML IV SOLN
500.0000 mg | Freq: Two times a day (BID) | INTRAVENOUS | Status: DC
  Administered 2020-09-25 – 2020-09-27 (×5): 500 mg via INTRAVENOUS
  Filled 2020-09-24 (×7): qty 100

## 2020-09-24 NOTE — Consult Note (Signed)
NAME:  Andrea King, MRN:  [id number], DOB:  [birth year], LOS: 1 ADMISSION DATE:  09/23/2020 CONSULTATION DATE:09/24/2020  REFERRING MD:  Nelson Chimes CHIEF COMPLAINT:  respiratory failure  BRIEF SYNOPSIS 45 y.o. 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   Pregnancy test came back negative. given 1 L bolus of IV lactated Ringer, 4 mg IV Zofran twice, 1 g of IV Toradol and IV Zosyn   INTERIM HISTORY SEVERE SEPSIS, HIGH FEVERS SEVERE ENCEPHALOPATHY PCCM WILL TAKE OVER  Micro Data:  COVID NEG HIV NEG INF A/B NEG 4 years ago MRSA bacteremia 08/2019-1 year ago MSSA bacteremia and TRICUSPID VALVE ENDOCARDITIS 08/2018 SPINAL INFECTIONS   Antimicrobials:   Antibiotics Given (last 72 hours)    Date/Time Action Medication Dose Rate   09/23/20 7824 New Bag/Given   cefTRIAXone (ROCEPHIN) 2 g in sodium chloride 0.9 % 100 mL IVPB 2 g 200 mL/hr   09/23/20 2353 New Bag/Given   metroNIDAZOLE (FLAGYL) IVPB 500 mg 500 mg 100 mL/hr   09/23/20 1110 New Bag/Given   metroNIDAZOLE (FLAGYL) IVPB 500 mg 500 mg 100 mL/hr   09/23/20 2021 New Bag/Given   metroNIDAZOLE (FLAGYL) IVPB 500 mg 500 mg 100 mL/hr   09/24/20 6144 New Bag/Given   metroNIDAZOLE (FLAGYL) IVPB 500 mg 500 mg 100 mL/hr   09/24/20 0630 New Bag/Given   cefTRIAXone (ROCEPHIN) 2 g in sodium chloride 0.9 % 100 mL IVPB 2 g 200 mL/hr        Significant Hospital Events: Including procedures, antibiotic start and stop dates in addition to other pertinent events   . 5/23 admitted for mental status changes . 5/24 severe encephalopathy and very high fevers 106 . 5/24 emergently intubated       Objective   Blood  pressure 103/75, pulse 87, temperature (!) 97.5 F (36.4 C), temperature source Axillary, resp. rate (!) 29, height 5' (1.524 m), weight 48.7 kg, SpO2 95 %.        Intake/Output Summary (Last 24 hours) at 09/24/2020 1100 Last data filed at 09/24/2020 3154 Gross per 24 hour  Intake 2410.25 ml  Output 500 ml  Net 1910.25 ml   Filed Weights   09/23/20 0032 09/23/20 0845  Weight: 54.4 kg 48.7 kg      REVIEW OF SYSTEMS  PATIENT IS UNABLE TO PROVIDE COMPLETE REVIEW OF SYSTEMS DUE TO SEVERE CRITICAL ILLNESS AND TOXIC METABOLIC ENCEPHALOPATHY   PHYSICAL EXAMINATION:  GENERAL:critically ill appearing, +resp distress HEAD: Normocephalic, atraumatic.  EYES: Pupils equal, round, reactive to light.  No scleral icterus.  MOUTH: Moist mucosal membrane. NECK: Supple.  PULMONARY: +rhonchi, +wheezing CARDIOVASCULAR: S1 and S2. Regular rate and rhythm. No murmurs, rubs, or gallops.  GASTROINTESTINAL: Soft, nontender, -distended. Positive bowel sounds.  MUSCULOSKELETAL: No swelling, clubbing, or edema.  NEUROLOGIC: obtunded SKIN:intact,warm,dry   Labs/imaging that I havepersonally reviewed  (right click and "Reselect all SmartList Selections" daily)      ASSESSMENT AND PLAN SYNOPSIS  46 yo white female with multiple medical issues with severe sepsis with high fevers with metabolic acidosis with severe hypoxic respiratory failure with severe toxic metabolic encephalopathy leading to severe acute decompensation with very high risk for cardiac arrest, patient emergently intubated, I ma concerned for septic  emboli and or severe infections encephalitis  Severe ACUTE Hypoxic and Hypercapnic Respiratory Failure -continue Mechanical Ventilator support -continue Bronchodilator Therapy -Wean Fio2 and PEEP as tolerated -VAP/VENT bundle implementation    SEVERE ASTHMA EXACERBATION -continue IV steroids as prescribed -continue NEB THERAPY as prescribed -morphine as needed -wean fio2 as  needed and tolerated   CARDIAC FAILURE-h/o endocarditis Patient will need TEE   CARDIAC ICU monitoring   NEUROLOGY Acute toxic metabolic encephalopathy, need for sedation Goal RASS -2 to -3 Will need MRI brain Will need LP   SEVERE SEPSIS -use vasopressors to keep MAP>65 as needed -follow ABG and LA -follow up cultures -emperic ABX -consider stress dose steroids -aggressive IV fluid resuscitation  INFECTIOUS DISEASE h/o MSSA endocarditis H/o septic emboli Will need MRI brain, CT chest -continue antibiotics as prescribed -follow up cultures -follow up ID consultation Need to consider LP, NEUROLOGY CONSULTED Will empirically treat for infectious encephalitis  ENDO - ICU hypoglycemic\Hyperglycemia protocol -check FSBS per protocol   GI GI PROPHYLAXIS as indicated  NUTRITIONAL STATUS DIET-->TF's as tolerated Constipation protocol as indicated   ELECTROLYTES -follow labs as needed -replace as needed -pharmacy consultation and following     Best practice (right click and "Reselect all SmartList Selections" daily)  Diet:  NPO Pain/Anxiety/Delirium protocol (if indicated): Yes (RASS goal -2) VAP protocol (if indicated): Yes DVT prophylaxis: Contraindicated GI prophylaxis:H2B Glucose control:  SSI Yes Central venous access:  Yes, and it is still needed Foley:  N/A Mobility: B ED REST Code Status:FULL Disposition: ICU  Labs   CBC: Recent Labs  Lab 09/23/20 0616 09/23/20 0744 09/23/20 1511 09/24/20 0056 09/24/20 0433  WBC 16.6* 16.7* 14.8* 13.6* 11.1*  NEUTROABS 15.5* 15.5*  --  12.3*  --   HGB 7.8* 9.1* 8.8* 8.8* 8.2*  HCT 23.8* 27.8* 26.6* 27.1* 24.7*  MCV 81.5 79.0* 80.1 80.4 78.4*  PLT 137* 141* 111* 88* 76*    Basic Metabolic Panel: Recent Labs  Lab 09/23/20 0043 09/23/20 0616 09/23/20 1511 09/24/20 0056 09/24/20 0433  NA 135 134* 136 131* 135  K 3.8 3.4* 3.4* 2.8* 3.5  CL 100 99 100 102 104  CO2 24 22 25  21* 22  GLUCOSE 130*  142* 135* 133* 127*  BUN 11 14 16 17 16   CREATININE 0.77 0.94 0.82 0.84 0.73  CALCIUM 8.8* 8.1* 7.8* 7.5* 7.6*  MG  --   --  1.5* 1.4* 2.6*  PHOS  --   --  3.1  --   --    GFR: Estimated Creatinine Clearance: 63.1 mL/min (by C-G formula based on SCr of 0.73 mg/dL). Recent Labs  Lab 09/23/20 0744 09/23/20 1511 09/24/20 0056 09/24/20 0433 09/24/20 1003  PROCALCITON 38.81  --   --   --   --   WBC 16.7* 14.8* 13.6* 11.1*  --   LATICACIDVEN 3.4* 2.0* 1.1 1.0 1.6    Liver Function Tests: Recent Labs  Lab 09/23/20 0043 09/23/20 0616 09/23/20 1511 09/24/20 0056 09/24/20 0433  AST 22 27 23 19 16   ALT 13 12 10 11 10   ALKPHOS 109 96 85 81 71  BILITOT 0.8 0.6 0.5 0.9 0.8  PROT 7.9 7.0 6.7 6.7 6.4*  ALBUMIN 3.2* 2.7* 2.7* 2.7* 2.8*   Recent Labs  Lab 09/23/20 0043  LIPASE 25   Recent Labs  Lab 09/23/20 0302  AMMONIA 26    ABG    Component Value Date/Time   HCO3 21.4 08/30/2019 2212   ACIDBASEDEF 2.3 (H) 08/30/2019 2212  O2SAT 65.1 08/30/2019 2212     Coagulation Profile: Recent Labs  Lab 09/23/20 0616 09/23/20 0744  INR 2.5* 1.3*    Cardiac Enzymes: Recent Labs  Lab 09/24/20 1003  CKTOTAL 10*    HbA1C: No results found for: HGBA1C  CBG: Recent Labs  Lab 09/24/20 0346 09/24/20 0802  GLUCAP 123* 125*     Past Medical History:  She,  has a past medical history of Anemia, Asthma, DJD (degenerative joint disease), Hepatitis C, and Substance abuse (HCC).   Surgical History:   Past Surgical History:  Procedure Laterality Date  . INGUINAL HERNIA REPAIR Left 04/28/2013   Procedure: HERNIA REPAIR INGUINAL ADULT with mesh;  Surgeon: Cherylynn Ridges, MD;  Location: [facility] OR;  Service: General;  Laterality: Left;  . TEE WITHOUT CARDIOVERSION N/A 11/18/2015   Procedure: TRANSESOPHAGEAL ECHOCARDIOGRAM (TEE);  Surgeon: Iran Ouch, MD;  Location: [facility] ORS;  Service: Cardiovascular;  Laterality: N/A;  . TEE WITHOUT CARDIOVERSION N/A 12/31/2017    Procedure: TRANSESOPHAGEAL ECHOCARDIOGRAM (TEE);  Surgeon: Dalia Heading, MD;  Location: [facility] ORS;  Service: Cardiovascular;  Laterality: N/A;  . TEE WITHOUT CARDIOVERSION N/A 01/04/2018   Procedure: TRANSESOPHAGEAL ECHOCARDIOGRAM (TEE);  Surgeon: Dalia Heading, MD;  Location: [facility] ORS;  Service: Cardiovascular;  Laterality: N/A;  . tricuspid valve replacement    . TUMOR EXCISION     left scalpula     Social History:   reports that she has been smoking cigarettes. She has never used smokeless tobacco. She reports current drug use. She reports that she does not drink alcohol.   Family History:  Her family history includes Cancer in her mother; Cancer - Colon in her maternal grandfather; Diabetes in her maternal grandfather.   Allergies No Known Allergies   Home Medications  Prior to Admission medications   Medication Sig Start Date End Date Taking? Authorizing Provider  amoxicillin-clavulanate (AUGMENTIN) 875-125 MG tablet Take 1 tablet by mouth 2 (two) times daily. Patient not taking: Reported on 09/23/2020    [provider]  aspirin EC 81 MG tablet Take 81 mg by mouth daily. Patient not taking: Reported on 09/23/2020    [provider]  Buprenorphine HCl-Naloxone HCl 8-2 MG FILM Place under the tongue. Patient not taking: Reported on 09/23/2020    [provider]  doxycycline (VIBRAMYCIN) 100 MG capsule Take 100 mg by mouth 2 (two) times daily. Patient not taking: Reported on 09/23/2020    [provider]  gabapentin (NEURONTIN) 300 MG capsule Take 300 mg by mouth 3 (three) times daily. Take 2 caps in the AM, midday, and 3 caps at night Patient not taking: Reported on 09/23/2020    [provider]  hydrOXYzine (VISTARIL) 25 MG capsule Take 25 mg by mouth 4 (four) times daily. Patient not taking: Reported on 09/23/2020    [provider]       DVT/GI PRX  assessed I Assessed the need for Labs I Assessed the need for Foley I  Assessed the need for Central Venous Line Family Discussion when available I Assessed the need for Mobilization I made an Assessment of medications to be adjusted accordingly Safety Risk assessment completed  CASE DISCUSSED IN MULTIDISCIPLINARY ROUNDS WITH ICU TEAM     Critical Care Time devoted to patient care services described in this note is 65  minutes.  Critical care was necessary to treat /prevent imminent and life-threatening deterioration. Overall, patient is critically ill, prognosis is guarded.  Patient with Multiorgan failure and at  high risk for cardiac arrest and death.    Corrin Parker, M.D.  [facility]  Medical Director [place] Director [facility] Cardio-Pulmonary Department

## 2020-09-24 NOTE — Progress Notes (Signed)
Pt getting very agitated. Temp 99.6. Pt is tachycardic. Breathing is extremely labored and O2 sats in the 70s. Pt is yelling that she is cold and wants to leave. Jeri Modena, NP at bedside to intubate.

## 2020-09-24 NOTE — Procedures (Signed)
Arterial Catheter Insertion Procedure Note  Andrea King  [id number]  03-04-1976  Date:09/24/20  Time:2:58 PM    Provider Performing: Judithe Modest    Procedure: Insertion of Arterial Line (54008) with US guidance (67619)   Indication(s) Blood pressure monitoring and/or need for frequent ABGs  Consent Unable to obtain consent due to emergent nature of procedure.  Anesthesia None   Time Out Verified patient identification, verified procedure, site/side was marked, verified correct patient position, special equipment/implants available, medications/allergies/relevant history reviewed, required imaging and test results available.   Sterile Technique Maximal sterile technique including full sterile barrier drape, hand hygiene, sterile gown, sterile gloves, mask, hair covering, sterile ultrasound probe cover (if used).   Procedure Description Area of catheter insertion was cleaned with chlorhexidine and draped in sterile fashion. With real-time ultrasound guidance an arterial catheter was placed into the right femoral artery.  Appropriate arterial tracings confirmed on monitor.     Complications/Tolerance None; patient tolerated the procedure well.   EBL Minimal   Specimen(s) None  BIOPATCH applied to the insertion site.    Harlon Ditty, AGACNP-BC [facility] Prefer epic messenger for cross cover needs If after hours, please call E-link

## 2020-09-24 NOTE — Progress Notes (Signed)
Patient's HR continued to increase over the last couple of hours. Rectal Temp was 105.6. IV Tylenol administered prior to taking temp. Steward Drone, NP notified. Cooling blanket ordered, Patient packed with ice & rubbed down with ice cold wash clothes.

## 2020-09-24 NOTE — Progress Notes (Signed)
This patient was consult for possible SBP with no significant amount of ascites found to support the diagnosis of SBP.  The patient's GI issues are not the main concern since the patient decompensated significantly today and was intubated in the ICU. The patient has been diagnosed with sepsis and severe encephalopathy.  The patient does not need any GI workup at this time.  As stated above SBP is unlikely the cause of this patient's issues.  I will sign off.  Please call if any further GI concerns or questions.  We would like to thank you for the opportunity to participate in the care of Andrea King.

## 2020-09-24 NOTE — Progress Notes (Signed)
Patient rectal temp down to 101.6, noted to have increased agitation. Continuous attempts  to get out of bed while pulling off monitoring devices. Precedex started as ordered without incident.

## 2020-09-24 NOTE — Procedures (Signed)
Central Venous Catheter Insertion Procedure Note  Andrea King  [id number]  [birth year]  Date:09/24/20  Time:11:57 AM   Provider Performing:Deem Marmol A Chapman Matteucci   Procedure: Insertion of Non-tunneled Central Venous Catheter(36556) with US guidance (23300)   Indication(s) Medication administration and Difficult access  Consent Unable to obtain consent due to emergent nature of procedure.  Anesthesia Topical only with 1% lidocaine   Timeout Verified patient identification, verified procedure, site/side was marked, verified correct patient position, special equipment/implants available, medications/allergies/relevant history reviewed, required imaging and test results available.  Sterile Technique Maximal sterile technique including full sterile barrier drape, hand hygiene, sterile gown, sterile gloves, mask, hair covering, sterile ultrasound probe cover (if used).  Procedure Description Area of catheter insertion was cleaned with chlorhexidine and draped in sterile fashion.  With real-time ultrasound guidance a central venous catheter was placed into the right internal jugular vein. Nonpulsatile blood flow and easy flushing noted in all ports.  The catheter was sutured in place and sterile dressing applied.  Complications/Tolerance None; patient tolerated the procedure well. Chest X-ray is ordered to verify placement for internal jugular or subclavian cannulation.   Chest x-ray is not ordered for femoral cannulation.  EBL Minimal   Catheter secured at 18 cm mark  Specimen(s) None  Webb Silversmith, DNP, CCRN, FNP-C, AGACNP-BC Acute Care Nurse Practitioner  [facility] Pager: [phone] [place] at [facility]

## 2020-09-24 NOTE — Progress Notes (Signed)
     Referral received for Andrea King :goals of care discussion. Chart reviewed and updates received. Patient required intubation earlier today. Staff currently at the bedside performing central/arterial line access. No family is present.   PMT acknowledges consult and will determine next of kin/decision maker with a goal of engaging in goals of care discussions.   PMT will attempt to contact family at a later time/date. Detailed note and recommendations to follow once GOC has been completed.   Thank you for your referral and allowing PMT to assist in Mrs. Andrea King's care.   Willette Alma, AGPCNP-BC Palliative Medicine Team  Phone: [phone] Pager: [phone] Amion: N. [place]   NO CHARGE

## 2020-09-24 NOTE — Consult Note (Addendum)
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 She presented to ED after acute onset AMS with abdominal pain and was found to be in severe sepsis with high fevers up to 106 and was ultimately intubated and admitted to ICU.   Micro data COVID NEG HIV NEG INF A/B NEG 4 years ago MRSA bacteremia 08/2019-1 year ago MSSA bacteremia and TRICUSPID VALVE ENDOCARDITIS 08/2018 SPINAL INFECTIONS  She is on empiric CNS coverage with vanc, ceftriaxone, ampicillin, and acyclovir. CTH showed NAICP.    ROS   10 point review of systems was performed and was negative except as described in HPI.  Past History   Past Medical History:  Diagnosis Date  . Anemia   . Asthma   . DJD (degenerative joint disease)   . Hepatitis C   . Substance abuse [facility])    Past Surgical History:  Procedure Laterality Date  . INGUINAL HERNIA REPAIR Left 04/28/2013   Procedure: HERNIA REPAIR INGUINAL ADULT with mesh;  Surgeon: Cherylynn Ridges, MD;  Location: [facility] OR;  Service: General;  Laterality: Left;  . TEE WITHOUT CARDIOVERSION N/A 11/18/2015   Procedure: TRANSESOPHAGEAL ECHOCARDIOGRAM (TEE);  Surgeon: Iran Ouch, MD;  Location: [facility] ORS;  Service: Cardiovascular;  Laterality: N/A;  . TEE WITHOUT CARDIOVERSION N/A 12/31/2017   Procedure: TRANSESOPHAGEAL ECHOCARDIOGRAM (TEE);  Surgeon: Dalia Heading, MD;  Location: [facility] ORS;  Service: Cardiovascular;  Laterality: N/A;  . TEE WITHOUT CARDIOVERSION N/A 01/04/2018   Procedure: TRANSESOPHAGEAL ECHOCARDIOGRAM (TEE);  Surgeon: Dalia Heading, MD;  Location: [facility]  ORS;  Service: Cardiovascular;  Laterality: N/A;  . tricuspid valve replacement    . TUMOR EXCISION     left scalpula   Family History  Problem Relation Age of Onset  . Cancer Mother   . Cancer - Colon Maternal Grandfather   . Diabetes Maternal Grandfather    Social History   Socioeconomic History  . Marital status: Single    Spouse name: Not on file  . Number of children: Not on file  . Years of education: Not on file  . Highest education level: Not on file  Occupational History  . Occupation: unemployed  Tobacco Use  . Smoking status: Current Every Day Smoker    Types: Cigarettes  . Smokeless tobacco: Never Used  Substance and Sexual Activity  . Alcohol use: No    Alcohol/week: 0.0 standard drinks  . Drug use: Yes    Comment: cocaine,Heroine  . Sexual activity: Yes    Birth control/protection: None  Other Topics Concern  . Not on file  Social History Narrative  . Not on file   Social Determinants of Health   Financial Resource Strain: Not on file  Food Insecurity: Not on file  Transportation Needs: Not on file  Physical Activity: Not on file  Stress: Not on file  Social Connections: Not on file   No Known Allergies  Medications   Medications Prior to Admission  Medication Sig Dispense Refill Last Dose  . amoxicillin-clavulanate (  AUGMENTIN) 875-125 MG tablet Take 1 tablet by mouth 2 (two) times daily. (Patient not taking: Reported on 09/23/2020)   Not Taking at Unknown time  . aspirin EC 81 MG tablet Take 81 mg by mouth daily. (Patient not taking: Reported on 09/23/2020)   Not Taking at Unknown time  . Buprenorphine HCl-Naloxone HCl 8-2 MG FILM Place under the tongue. (Patient not taking: Reported on 09/23/2020)   Not Taking at Unknown time  . doxycycline (VIBRAMYCIN) 100 MG capsule Take 100 mg by mouth 2 (two) times daily. (Patient not taking: Reported on 09/23/2020)   Not Taking at Unknown time  . gabapentin (NEURONTIN) 300 MG capsule Take 300 mg by mouth 3  (three) times daily. Take 2 caps in the AM, midday, and 3 caps at night (Patient not taking: Reported on 09/23/2020)   Not Taking at Unknown time  . hydrOXYzine (VISTARIL) 25 MG capsule Take 25 mg by mouth 4 (four) times daily. (Patient not taking: Reported on 09/23/2020)   Not Taking at Unknown time     Vitals   Vitals:   09/24/20 1900 09/24/20 1915 09/24/20 1930 09/24/20 1945  BP: 123/70 129/71 127/76 135/78  Pulse: 85     Resp: 20     Temp:    97.7 F (36.5 C)  TempSrc:      SpO2: 97%     Weight:      Height:         Body mass index is 20.97 kg/m.  Physical Exam   Pt not examined off sedation today bc she was unstable and hypotensive requiring initiation of pressors  MS: opens eyes but does not follow commands Speech: intubated CN: PERRL, (+) corneals, oculocephalics, cough, gag Motor & sensory: withdraws to noxious stimuli though not briskly in all extremities  Labs   CBC:  Recent Labs  Lab 09/23/20 0744 09/23/20 1511 09/24/20 0056 09/24/20 0433  WBC 16.7*   < > 13.6* 11.1*  NEUTROABS 15.5*  --  12.3*  --   HGB 9.1*   < > 8.8* 8.2*  HCT 27.8*   < > 27.1* 24.7*  MCV 79.0*   < > 80.4 78.4*  PLT 141*   < > 88* 76*   < > = values in this interval not displayed.    Basic Metabolic Panel:  Lab Results  Component Value Date   NA 135 09/24/2020   K 3.5 09/24/2020   CO2 22 09/24/2020   GLUCOSE 127 (H) 09/24/2020   BUN 16 09/24/2020   CREATININE 0.73 09/24/2020   CALCIUM 7.6 (L) 09/24/2020   GFRNONAA >60 09/24/2020   GFRAA >60 09/26/2019   Lipid Panel: No results found for: LDLCALC HgbA1c: No results found for: HGBA1C Urine Drug Screen:     Component Value Date/Time   LABOPIA NONE DETECTED 09/23/2020 0043   LABOPIA POSITIVE (A) 09/20/2017 1751   COCAINSCRNUR NONE DETECTED 09/23/2020 0043   LABBENZ NONE DETECTED 09/23/2020 0043   LABBENZ POSITIVE (A) 09/20/2017 1751   AMPHETMU NONE DETECTED 09/23/2020 0043   AMPHETMU NONE DETECTED 09/20/2017 1751    THCU NONE DETECTED 09/23/2020 0043   THCU NONE DETECTED 09/20/2017 1751   LABBARB NONE DETECTED 09/23/2020 0043   LABBARB NONE DETECTED 09/20/2017 1751    Alcohol Level     Component Value Date/Time   ETH <10 08/30/2019 2212     Impression   This is a 45 yo patient with hx IVDA, hep C, DJD, anemia, asthma, hx MSSA bacteremia, tricuspid valve endocarditis  with septic pulmonary emboli as well as disci tis/osteomyelitis (latter unknown location) admitted with severe encephalopathy, severe sepsis with unknown source, fevers up to 106.   Recommendations   - Continue empiric coverage for CNS infection - Bedside LP planned for today was unable to be completed 2/2 patient being unstable. Given her hx discitis/osteo of unknown location plan for IR LP tomorrow (studies to incl cell count in 2 tubes, protein, glucose, crypto antigen, HSV, VZV, culture) - MRI brain, c, t and l spine with and without contrast ordered (contrast imperative given sepsis indicating infection) - Continued medical mgmt by ICU - Consider EEG tomorrow  Will continue to follow. ______________________________________________________________________   Thank you for the opportunity to take part in the care of this patient. If you have any further questions, please contact the neurology consultation attending.  Signed,  Bing Neighbors, MD [facility] [phone]  If 7pm- 7am, please page neurology on call as listed in AMION.'

## 2020-09-24 NOTE — Procedures (Signed)
Intubation Procedure Note  KAZIAH KRIZEK  [id number]  1975-07-12  Date:09/24/20  Time:11:20 AM   Provider Performing:Yaeko Fazekas D Elvina Sidle    Procedure: Intubation (31500)  Indication(s) Respiratory Failure  Consent Unable to obtain consent due to emergent nature of procedure.   Anesthesia Etomidate, Fentanyl and Rocuronium   Time Out Verified patient identification, verified procedure, site/side was marked, verified correct patient position, special equipment/implants available, medications/allergies/relevant history reviewed, required imaging and test results available.   Sterile Technique Usual hand hygeine, masks, and gloves were used   Procedure Description Patient positioned in bed supine.  Sedation given as noted above.  Patient was intubated with endotracheal tube using Glidescope.  View was Grade 1 full glottis .  Number of attempts was 1.  Colorimetric CO2 detector was consistent with tracheal placement.   Complications/Tolerance None; patient tolerated the procedure well. Chest X-ray is ordered to verify placement.   EBL Minimal   Specimen(s) None  Size 7.5 cm ETT  Secured at 23 cm at the lip.   Harlon Ditty, AGACNP-BC [facility] Prefer epic messenger for cross cover needs If after hours, please call E-link

## 2020-09-24 NOTE — Progress Notes (Signed)
Pt intubated at this time by Jeri Modena, NP.

## 2020-09-24 NOTE — Progress Notes (Signed)
Pharmacy Antibiotic Note  Andrea King is a 45 y.o. female admitted on 09/23/2020. 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 Concern for possible infectious encephalitis and septic emboli. Pharmacy has been consulted for vancomycin and acyclovir dosing.  Plan: Vancomycin 1000 mg followed by 500 mg q12h for goal trough 15-20  Acyclovir 10 mg/kg q8h  Ampicillin 2 g q4h  Increase ceftriaxone to 2 g q12h  Follow up cultures for ability to de-escalate as appropriate.  Height: 5' (152.4 cm) Weight: 48.7 kg (107 lb 5.8 oz) IBW/kg (Calculated) : 45.5  Temp (24hrs), Avg:99.6 F (37.6 C), Min:97.1 F (36.2 C), Max:105.6 F (40.9 C)  Recent Labs  Lab 09/23/20 0043 09/23/20 0302 09/23/20 0616 09/23/20 0744 09/23/20 1511 09/24/20 0056 09/24/20 0433 09/24/20 1003  WBC 23.2*  --  16.6* 16.7* 14.8* 13.6* 11.1*  --   CREATININE 0.77  --  0.94  --  0.82 0.84 0.73  --   LATICACIDVEN  --    < >  --  3.4* 2.0* 1.1 1.0 1.6   < > = values in this interval not displayed.    Estimated Creatinine Clearance: 63.1 mL/min (by C-G formula based on SCr of 0.73 mg/dL).    No Known Allergies  Antimicrobials this admission: Vanc 5/24 >> Ampicillin 5/24 >> Acyclovir 5/24 >> Ceftriaxone 5/23 >> Metronidazole 5/23 >> 5/24   Microbiology results: 5/23 BCx: NG 5/24 UCx: pending  5/23 MRSA PCR: negative  Thank you for allowing pharmacy to be a part of this patient's care.  Pricilla Riffle, PharmD 09/24/2020 1:50 PM

## 2020-09-25 ENCOUNTER — Inpatient Hospital Stay: Payer: Medicaid Other

## 2020-09-25 DIAGNOSIS — R4182 Altered mental status, unspecified: Secondary | ICD-10-CM | POA: Diagnosis not present

## 2020-09-25 DIAGNOSIS — Z7189 Other specified counseling: Secondary | ICD-10-CM

## 2020-09-25 DIAGNOSIS — J9601 Acute respiratory failure with hypoxia: Secondary | ICD-10-CM | POA: Diagnosis not present

## 2020-09-25 DIAGNOSIS — R112 Nausea with vomiting, unspecified: Secondary | ICD-10-CM | POA: Diagnosis not present

## 2020-09-25 DIAGNOSIS — R1084 Generalized abdominal pain: Secondary | ICD-10-CM | POA: Diagnosis not present

## 2020-09-25 DIAGNOSIS — Z515 Encounter for palliative care: Secondary | ICD-10-CM

## 2020-09-25 LAB — COMPREHENSIVE METABOLIC PANEL
ALT: 31 U/L (ref 0–44)
AST: 71 U/L — ABNORMAL HIGH (ref 15–41)
Albumin: 2.5 g/dL — ABNORMAL LOW (ref 3.5–5.0)
Alkaline Phosphatase: 58 U/L (ref 38–126)
Anion gap: 10 (ref 5–15)
BUN: 27 mg/dL — ABNORMAL HIGH (ref 6–20)
CO2: 19 mmol/L — ABNORMAL LOW (ref 22–32)
Calcium: 7.1 mg/dL — ABNORMAL LOW (ref 8.9–10.3)
Chloride: 111 mmol/L (ref 98–111)
Creatinine, Ser: 0.93 mg/dL (ref 0.44–1.00)
GFR, Estimated: >60 mL/min (ref >60)
Glucose, Bld: 125 mg/dL — ABNORMAL HIGH (ref 70–99)
Potassium: 3.9 mmol/L (ref 3.5–5.1)
Sodium: 140 mmol/L (ref 135–145)
Total Bilirubin: 0.8 mg/dL (ref 0.3–1.2)
Total Protein: 5.7 g/dL — ABNORMAL LOW (ref 6.5–8.1)

## 2020-09-25 LAB — BLOOD GAS, ARTERIAL
Acid-base deficit: 7.6 mmol/L — ABNORMAL HIGH (ref 0.0–2.0)
Bicarbonate: 18.2 mmol/L — ABNORMAL LOW (ref 20.0–28.0)
FIO2: 0.5
MECHVT: 400 mL
Mechanical Rate: 24
O2 Saturation: 98.1 %
PEEP: 5 cmH2O
Patient temperature: 36.1
RATE: 24 resp/min
pCO2 arterial: 36 mmHg (ref 32.0–48.0)
pH, Arterial: 7.31 — ABNORMAL LOW (ref 7.350–7.450)
pO2, Arterial: 111 mmHg — ABNORMAL HIGH (ref 83.0–108.0)

## 2020-09-25 LAB — URINE CULTURE: Culture: NO GROWTH

## 2020-09-25 LAB — LEGIONELLA PNEUMOPHILA SEROGP 1 UR AG: L. pneumophila Serogp 1 Ur Ag: NEGATIVE

## 2020-09-25 LAB — CBC WITH DIFFERENTIAL/PLATELET
Abs Immature Granulocytes: 0.27 10*3/uL — ABNORMAL HIGH (ref 0.00–0.07)
Basophils Absolute: 0.1 10*3/uL (ref 0.0–0.1)
Basophils Relative: 0 %
Eosinophils Absolute: 0.2 10*3/uL (ref 0.0–0.5)
Eosinophils Relative: 1 %
HCT: 26 % — ABNORMAL LOW (ref 36.0–46.0)
Hemoglobin: 8.4 g/dL — ABNORMAL LOW (ref 12.0–15.0)
Immature Granulocytes: 1 %
Lymphocytes Relative: 6 %
Lymphs Abs: 1.4 10*3/uL (ref 0.7–4.0)
MCH: 26.2 pg (ref 26.0–34.0)
MCHC: 32.3 g/dL (ref 30.0–36.0)
MCV: 81 fL (ref 80.0–100.0)
Monocytes Absolute: 1.2 10*3/uL — ABNORMAL HIGH (ref 0.1–1.0)
Monocytes Relative: 5 %
Neutro Abs: 19.8 10*3/uL — ABNORMAL HIGH (ref 1.7–7.7)
Neutrophils Relative %: 87 %
Platelets: 61 10*3/uL — ABNORMAL LOW (ref 150–400)
RBC: 3.21 MIL/uL — ABNORMAL LOW (ref 3.87–5.11)
RDW: 15.6 % — ABNORMAL HIGH (ref 11.5–15.5)
Smear Review: DECREASED
WBC: 22.8 10*3/uL — ABNORMAL HIGH (ref 4.0–10.5)
nRBC: 0 % (ref 0.0–0.2)

## 2020-09-25 LAB — PROCALCITONIN: Procalcitonin: >150 ng/mL

## 2020-09-25 LAB — ECHOCARDIOGRAM COMPLETE
Area-P 1/2: 5.54 cm2
Height: 60 in
MV M vel: 5.4 m/s
MV Peak grad: 116.6 mmHg
S' Lateral: 3.15 cm
Weight: 1717.82 oz

## 2020-09-25 LAB — GLUCOSE, CAPILLARY
Glucose-Capillary: 117 mg/dL — ABNORMAL HIGH (ref 70–99)
Glucose-Capillary: 163 mg/dL — ABNORMAL HIGH (ref 70–99)
Glucose-Capillary: 138 mg/dL — ABNORMAL HIGH (ref 70–99)
Glucose-Capillary: 130 mg/dL — ABNORMAL HIGH (ref 70–99)
Glucose-Capillary: 152 mg/dL — ABNORMAL HIGH (ref 70–99)
Glucose-Capillary: 148 mg/dL — ABNORMAL HIGH (ref 70–99)

## 2020-09-25 LAB — TRIGLYCERIDES: Triglycerides: 129 mg/dL (ref <150)

## 2020-09-25 LAB — PHOSPHORUS: Phosphorus: 4 mg/dL (ref 2.5–4.6)

## 2020-09-25 LAB — MAGNESIUM: Magnesium: 2.6 mg/dL — ABNORMAL HIGH (ref 1.7–2.4)

## 2020-09-25 MED ORDER — FREE WATER
30.0000 mL | Status: DC
  Administered 2020-09-25 – 2020-09-29 (×23): 30 mL

## 2020-09-25 MED ORDER — FOLIC ACID 1 MG PO TABS
1.0000 mg | ORAL_TABLET | Freq: Every day | ORAL | Status: DC
  Administered 2020-09-26 – 2020-09-29 (×4): 1 mg
  Filled 2020-09-25 (×4): qty 1

## 2020-09-25 MED ORDER — ADULT MULTIVITAMIN W/MINERALS CH
1.0000 | ORAL_TABLET | Freq: Every day | ORAL | Status: DC
  Administered 2020-09-26 – 2020-09-29 (×4): 1
  Filled 2020-09-25 (×4): qty 1

## 2020-09-25 MED ORDER — SODIUM CHLORIDE 0.9 % IV SOLN
0.5000 mg/h | INTRAVENOUS | Status: DC
  Administered 2020-09-25: 1 mg/h via INTRAVENOUS
  Administered 2020-09-26 – 2020-09-27 (×5): 4 mg/h via INTRAVENOUS
  Administered 2020-09-28: 3.5 mg/h via INTRAVENOUS
  Administered 2020-09-28: 4 mg/h via INTRAVENOUS
  Filled 2020-09-25 (×10): qty 5

## 2020-09-25 MED ORDER — CLONAZEPAM 1 MG PO TABS
2.0000 mg | ORAL_TABLET | Freq: Two times a day (BID) | ORAL | Status: DC
  Administered 2020-09-25 (×2): 2 mg
  Filled 2020-09-25 (×2): qty 2

## 2020-09-25 MED ORDER — HYDROMORPHONE BOLUS VIA INFUSION
0.2500 mg | INTRAVENOUS | Status: DC | PRN
  Administered 2020-09-25: 1 mg via INTRAVENOUS
  Administered 2020-09-26 – 2020-09-28 (×10): 2 mg via INTRAVENOUS
  Filled 2020-09-25: qty 2

## 2020-09-25 MED ORDER — ACETAMINOPHEN 325 MG PO TABS: 650.0000 mg | ORAL_TABLET | Freq: Four times a day (QID) | ORAL | Status: DC | PRN

## 2020-09-25 MED ORDER — ACETAMINOPHEN 650 MG RE SUPP: 650.0000 mg | Freq: Four times a day (QID) | RECTAL | Status: DC | PRN

## 2020-09-25 MED ORDER — HYDROMORPHONE HCL 1 MG/ML IJ SOLN: 1.0000 mg | Freq: Once | INTRAMUSCULAR | Status: DC

## 2020-09-25 MED ORDER — VITAL AF 1.2 CAL PO LIQD
1000.0000 mL | ORAL | Status: DC
  Administered 2020-09-25 – 2020-09-26 (×2): 1000 mL

## 2020-09-25 MED ORDER — OXYCODONE HCL 5 MG/5ML PO SOLN
5.0000 mg | Freq: Four times a day (QID) | ORAL | Status: DC
  Administered 2020-09-25 – 2020-09-26 (×4): 5 mg
  Filled 2020-09-25 (×4): qty 5

## 2020-09-25 MED ORDER — VECURONIUM BROMIDE 10 MG IV SOLR
20.0000 mg | Freq: Once | INTRAVENOUS | Status: AC
  Administered 2020-09-25: 20 mg via INTRAVENOUS
  Filled 2020-09-25: qty 20

## 2020-09-25 NOTE — Procedures (Signed)
Routine EEG Report  Andrea King is a 45 y.o. female with a history of severe encephalopathy who is undergoing an EEG to evaluate for seizures. EEG was recorded on fentanyl, versed was paused x30 min prior to recording.  Report: This EEG was acquired with electrodes placed according to the International 10-20 electrode system (including Fp1, Fp2, F3, F4, C3, C4, P3, P4, O1, O2, T3, T4, T5, T6, A1, A2, Fz, Cz, Pz). The following electrodes were missing or displaced: none.  The best background was 7-8 Hz near-continuous activity with bifrontotemporal slowing and relative suppression over the L temporoparietal region. This activity is reactive to stimulation. There was some sleep architecture (spindles) present). There were no interictal epileptiform discharges. There were no electrographic seizures identified. There was no abnormal response to photic stimulation. Hyperventilation was not performed.   Impression and clinical correlation: This EEG was obtained while comatose and is abnormal due to mild diffuse slowing and bifrontotemporal focal slowing indicating generalized and bilateral focal cerebral dysfunction.   Bing Neighbors, MD [facility] [phone]  If 7pm- 7am, please page neurology on call as listed in AMION.

## 2020-09-25 NOTE — Progress Notes (Signed)
Patient transported to CT via transport vent with RN.  Patient tolerated well.

## 2020-09-25 NOTE — Progress Notes (Signed)
eeg done °

## 2020-09-25 NOTE — Progress Notes (Signed)
Unable to complete MRI, due MRI  PO2 probe being misplaced and not having a replacement. Study would be approximately 2 1/2 to 3 hours long and patient is ventilated. Annabelle Harman NP made aware.

## 2020-09-25 NOTE — Consult Note (Signed)
Consultation Note Date: 09/25/2020   Patient Name: Andrea King  DOB: 02-Apr-1976  MRN: [id number]  Age / Sex: 45 y.o., female   PCP: Andrea King, CNM Referring Physician: [name], MD   REASON FOR CONSULTATION:Establishing goals of care  Palliative Care consult requested for goals of care discussion in this 45 y.o. 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  Patient presented to the ED via private vehicle with concerns for acute altered mental status, abdominal pain, and intractable nausea and vomiting.  Head CT showed no acute abnormalities.  Abdominal CT showed questionable gallbladder wall thickening, hepatosplenomegaly, there is perihepatic and pelvic ascites with multiple pulmonary nodules.  UDS negative.  Since admission patient developed severe hypoxic respiratory failure requiring emergent intubation.  Clinical Assessment and Goals of Care: I have reviewed medical records including lab results, imaging, Epic notes, and MAR, received report from the bedside RN, and assessed the patient.   Patient remains intubated/sedated in critical condition.  I introduced Palliative Medicine as specialized medical care for people living with serious illness. It focuses on providing relief from the symptoms and stress of a serious illness. The goal is to improve quality of life for both the patient and the family.  I attempted to reach patient's father as listed in demographics without success.  After several rings message stating number is unavailable.  Unsuccessful attempt made to reach Andrea King (uncle) unable to leave voicemail.  I was able to successfully reach patient's aunt (mother's sister) [name].  Patient's aunt states she has  been estranged from most of her family for many years including her father. Andrea King states she and her brother Andrea King are the closest relatives to patient that have some form of relationship with her.  Patient has 4 children who she has not seen in years.  Andrea King is around 45 years old and is a ward of the state, 2 of the children reside with the father's parents, and her oldest lives alone however location is unknown to family as she has been estranged for many years and also raised in the foster system.  Discussed at length with patient's aunt we are trying to establish next of kin to assist with decision-making.  Aunt states she and her brother Andrea King would serve as surrogate decision makers for patient.  Detailed updates provided.  Patient's aunt was tearful expressing they have been concerned about her whereabouts and how she is doing.  She states patient is not stable and lives with multiple people (mostly her drug dealers or associates). Aunt shares patient's previous near death experiences over the years at [place] all related to her drug addictions and poor health choices. Andrea King states both she and Andrea King were her support at that time also.   We discussed Her current illness and what it means in the larger context of Her on-going co-morbidities. Natural disease trajectory and expectations at EOL were discussed.  Andrea King states she would like for patient's uncle to also be updated and involved in any decision making. She is tearful expressing their concerns for patient's well being and chances of survival each time she has been hospitalized. She is tearful expressing when they receive a call in regards to patient they fear it is to notify them of her death. Emotional support provided.  I discussed at length patient's full code status with consideration of her current illness and co-morbidities.   Patient's uncle Andrea King "Andrea King" returned call. Updates provided. We continued discussions to include  code status and life-prolonging measures. Andrea King states he has always been patient's primary contact and when she was in the hospital at [facility] and she completed documents stating he and Andrea King were her medical decision makers. He states he did not receive a copy of the documents but knew they were completed. He was primary and Andrea King was secondary.   Uncle states he saw patient 3 weeks ago and at that time she looked ill and complaining of severe lower back pain and weakness. States she informed him that she may have another infection. He unfortunately was not able to follow up on how she was doing due to her unknown location.   Andrea King states patient has been through a lot and her condition sounds similar as before. He shares she almost died due to the extent of condition at that time, resulting in hospitalization for almost 3 months, however she survived and was able to discharge home.  States patient has been intubated before and although the medical team did not think she would make it she did and he wishes to allow her this opportunity again.  Strongly encourage family to continue discussions with recommendations for DNR.  Family verbalized understanding expressing their wishes to continue to treat the treatable aggressively and take it day by day.  If patient does not survive they would be at peace knowing they at least tried.  Family would not want tracheostomy/PEG knowing this would not be the quality of life she would want.   I discussed the importance of continued conversation with family and their medical providers regarding overall plan of care and treatment options, ensuring decisions are within the context of the patients values and GOCs.  Questions and concerns were addressed.  The family was encouraged to call with questions or concerns.  PMT will continue to support holistically as needed.   CODE STATUS: Full code  ADVANCE DIRECTIVES: Primary Decision Maker: Andrea King and Andrea King   SYMPTOM MANAGEMENT: per attending  Palliative Prophylaxis:   Aspiration and Turn Reposition  PSYCHO-SOCIAL/SPIRITUAL:  Support System: Family  Desire for further Chaplaincy support: NO  Additional Recommendations (Limitations, Scope, Preferences):  Full Scope Treatment  Education on hospice/palliative    PAST MEDICAL HISTORY: Past Medical History:  Diagnosis Date  . Anemia   . Asthma   . DJD (degenerative joint disease)   . Hepatitis C   . Substance abuse ([place])     ALLERGIES:  has No Known Allergies.   MEDICATIONS:  Current Facility-Administered Medications  Medication Dose Route Frequency Provider Last Rate Last Admin  . 0.9 %  sodium chloride infusion   Intravenous Continuous Mansy, Arvella Merles, MD 100 mL/hr at 09/25/20 0926 New Bag at 09/25/20 0926  . acetaminophen (TYLENOL) tablet 650 mg  650 mg Per Tube Q6H PRN Andrea Lipps, MD       Or  . acetaminophen (TYLENOL) suppository 650 mg  650 mg Rectal Q6H PRN Andrea Lipps, MD      . acyclovir (ZOVIRAX) 485 mg in dextrose 5 % 100 mL IVPB  10 mg/kg Intravenous Q8H Lorella Nimrod, MD 110 mL/hr at 09/25/20 0600 Infusion Verify at 09/25/20 0600  . ampicillin (OMNIPEN) 2 g in sodium chloride 0.9 % 100 mL IVPB  2 g Intravenous Q4H Andrea Lipps, MD 300 mL/hr at 09/25/20  1243 2 g at 09/25/20 1243  . anidulafungin (ERAXIS) 100 mg in sodium chloride 0.9 % 100 mL IVPB  100 mg Intravenous Q24H Bradly Bienenstock, NP   Stopped at 09/24/20 2002  . cefTRIAXone (ROCEPHIN) 2 g in sodium chloride 0.9 % 100 mL IVPB  2 g Intravenous Q12H Andrea Lipps, MD 200 mL/hr at 09/25/20 0924 2 g at 09/25/20 0924  . chlorhexidine gluconate (MEDLINE KIT) (PERIDEX) 0.12 % solution 15 mL  15 mL Mouth Rinse BID Andrea Lipps, MD   15 mL at 09/25/20 0741  . Chlorhexidine Gluconate Cloth 2 % PADS 6 each  6 each Topical Daily Lorella Nimrod, MD   6 each at 09/23/20 1429  . clonazePAM (KLONOPIN) tablet 2 mg  2 mg Per Tube BID Andrea Lipps, MD   2 mg at 09/25/20  1244  . docusate (COLACE) 50 MG/5ML liquid 100 mg  100 mg Per Tube BID Darel Hong D, NP   100 mg at 09/25/20 0916  . enoxaparin (LOVENOX) injection 40 mg  40 mg Subcutaneous Q24H Mansy, Jan A, MD   40 mg at 09/25/20 0735  . feeding supplement (VITAL AF 1.2 CAL) liquid 1,000 mL  1,000 mL Per Tube Q24H Andrea Lipps, MD 15 mL/hr at 09/25/20 1318 1,000 mL at 09/25/20 1318  . fentaNYL 2562mcg in NS 266mL (3mcg/ml) infusion-PREMIX  0-400 mcg/hr Intravenous Continuous Andrea Lipps, MD 40 mL/hr at 09/25/20 0736 400 mcg/hr at 09/25/20 0736  . [START ON 7/35/7897] folic acid (FOLVITE) tablet 1 mg  1 mg Per Tube Daily Kasa, Kurian, MD      . free water 30 mL  30 mL Per Tube Q4H Andrea Lipps, MD   30 mL at 09/25/20 1321  . hydrocortisone sodium succinate (SOLU-CORTEF) 100 MG injection 50 mg  50 mg Intravenous Q6H Darel Hong D, NP   50 mg at 09/25/20 1244  . HYDROmorphone (DILAUDID) 50 mg in sodium chloride 0.9 % 100 mL (0.5 mg/mL) infusion  0.5-4 mg/hr Intravenous Continuous Andrea Lipps, MD 2 mL/hr at 09/25/20 1310 1 mg/hr at 09/25/20 1310  . HYDROmorphone (DILAUDID) bolus via infusion 0.25-2 mg  0.25-2 mg Intravenous Q30 min PRN Andrea Lipps, MD   1 mg at 09/25/20 1314  . HYDROmorphone (DILAUDID) injection 1 mg  1 mg Intravenous Once Andrea Lipps, MD      . labetalol (NORMODYNE) injection 10 mg  10 mg Intravenous Q2H PRN Darel Hong D, NP      . magnesium hydroxide (MILK OF MAGNESIA) suspension 30 mL  30 mL Oral Daily PRN Mansy, Jan A, MD      . MEDLINE mouth rinse  15 mL Mouth Rinse 10 times per day Andrea Lipps, MD   15 mL at 09/25/20 1321  . metoCLOPramide (REGLAN) injection 10 mg  10 mg Intravenous Q6H Mansy, Jan A, MD   10 mg at 09/25/20 1244  . midazolam (VERSED) 50 mg/50 mL (1 mg/mL) premix infusion  0.5-10 mg/hr Intravenous Continuous Andrea Lipps, MD 8 mL/hr at 09/25/20 1056 8 mg/hr at 09/25/20 1056  . midazolam (VERSED) injection 2 mg  2 mg Intravenous Q15 min PRN Darel Hong D, NP    2 mg at 09/24/20 1200  . midazolam (VERSED) injection 2 mg  2 mg Intravenous Q2H PRN Bradly Bienenstock, NP   2 mg at 09/24/20 2230  . [START ON 09/26/2020] multivitamin with minerals tablet 1 tablet  1 tablet Per Tube Daily Andrea Lipps, MD      . norepinephrine (  LEVOPHED) 16 mg in 249mL premix infusion  0-40 mcg/min Intravenous Titrated Darel Hong D, NP 9.38 mL/hr at 09/25/20 1312 10 mcg/min at 09/25/20 1312  . ondansetron (ZOFRAN) tablet 4 mg  4 mg Oral Q6H PRN Mansy, Jan A, MD       Or  . ondansetron [facility]) injection 4 mg  4 mg Intravenous Q6H PRN Mansy, Jan A, MD      . oxyCODONE (ROXICODONE) 5 MG/5ML solution 5 mg  5 mg Per Tube Q6H Andrea Lipps, MD   5 mg at 09/25/20 1244  . pantoprazole (PROTONIX) injection 40 mg  40 mg Intravenous Q12H Mansy, Jan A, MD   40 mg at 09/25/20 0917  . phenylephrine CONCENTRATED 100mg  in sodium chloride 0.9% 274mL (0.4mg /mL) infusion  0-400 mcg/min Intravenous Titrated Darel Hong D, NP 12 mL/hr at 09/25/20 0600 80 mcg/min at 09/25/20 0600  . polyethylene glycol (MIRALAX / GLYCOLAX) packet 17 g  17 g Per Tube Daily Darel Hong D, NP   17 g at 09/25/20 0917  . thiamine tablet 100 mg  100 mg Oral Daily Lorella Nimrod, MD   100 mg at 09/25/20 9030   Or  . thiamine (B-1) injection 100 mg  100 mg Intravenous Daily Lorella Nimrod, MD   100 mg at 09/24/20 0936  . vancomycin (VANCOREADY) IVPB 500 mg/100 mL  500 mg Intravenous Q12H Andrea Lipps, MD 100 mL/hr at 09/25/20 1330 500 mg at 09/25/20 1330  . vasopressin (PITRESSIN) 20 Units in sodium chloride 0.9 % 100 mL infusion-*FOR SHOCK*  0-0.03 Units/min Intravenous Continuous Darel Hong D, NP 6 mL/hr at 09/25/20 0600 0.02 Units/min at 09/25/20 0600    VITAL SIGNS: BP 118/65   Pulse 89   Temp 98.42 F (36.9 C)   Resp (!) 24   Ht 5' (1.524 m)   Wt 48.7 kg   LMP  (LMP Unknown)   SpO2 97%   BMI 20.97 kg/m  Filed Weights   09/23/20 0032 09/23/20 0845  Weight: 54.4 kg 48.7 kg    Estimated body  mass index is 20.97 kg/m as calculated from the following:   Height as of this encounter: 5' (1.524 m).   Weight as of this encounter: 48.7 kg.  LABS: CBC:    Component Value Date/Time   WBC 22.8 (H) 09/25/2020 0413   HGB 8.4 (L) 09/25/2020 0413   HCT 26.0 (L) 09/25/2020 0413   PLT 61 (L) 09/25/2020 0413   Comprehensive Metabolic Panel:    Component Value Date/Time   NA 140 09/25/2020 0413   K 3.9 09/25/2020 0413   BUN 27 (H) 09/25/2020 0413   CREATININE 0.93 09/25/2020 0413   ALBUMIN 2.5 (L) 09/25/2020 0413     Review of Systems  Unable to perform ROS: Intubated   Physical Exam General: Critically ill Cardiovascular: regular rate and rhythm Pulmonary: Rhonchi Abdomen: soft, nontender, + bowel sounds Extremities: no edema, no joint deformities Skin: no rashes, warm and dry, track marks Neurological: Obtunded, intubated, sedated  Prognosis: Extremely guarded-poor  Discharge Planning:  To Be Determined  Recommendations: . Full Code-as confirmed by next of kin . Continue with current plan of care, with aggressive interventions as needed . Long discussion with patient's aunt and uncle who states they are patient's next of kin/decision makers.  Uncle states patient completed documentation while at [facility] naming them both as her POA.  Patient has been estranged from her father, children, and other family members for many years.  Aunt and uncle requesting full  scope care allowing patient every opportunity to improve.  They continue to compare patient's previous admissions at [facility] stating she was critical and required intubation at that time, medical team did not feel she would make it, fortunately with time she was able to survive and continue living a normal life (with emphasis of poor health/life choices continuing). . Recommendations provided encouraging family to continue with ongoing discussions and consideration for DNR. Marland Kitchen PMT will continue to support and follow as  needed. Please call team line with urgent needs.   Palliative Performance Scale: Intubated              Family expressed understanding and was in agreement with this plan.   Thank you for allowing the Palliative Medicine Team to assist in the care of this patient. Please utilize secure chat with additional questions, if there is no response within 30 minutes please call the above phone number.   Time In: 1345 Time Out: 1500 Time Total: 75 min   Visit consisted of counseling and education dealing with the complex and emotionally intense issues of symptom management and palliative care in the setting of serious and potentially life-threatening illness.Greater than 50%  of this time was spent counseling and coordinating care related to the above assessment and plan.  Signed by:  Alda Lea, AGPCNP-BC Palliative Medicine Team  Phone: [phone] Pager: [phone] Amion: [place] Team providers are available by phone from 7am to 7pm daily and can be reached through the team cell phone.  Should this patient require assistance outside of these hours, please call the patient's attending physician.

## 2020-09-25 NOTE — Progress Notes (Signed)
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 She presented to ED after acute onset AMS with abdominal pain and was found to be in severe sepsis with high fevers up to 106 and was ultimately intubated and admitted to ICU.   Micro data COVID NEG HIV NEG INF A/B NEG 4 years ago MRSA bacteremia 08/2019-1 year ago MSSA bacteremia and TRICUSPID VALVE ENDOCARDITIS 08/2018 SPINAL INFECTIONS  She is on empiric CNS coverage with vanc, ceftriaxone, ampicillin, and acyclovir. CTH showed NAICP.   Subjective: - NAEON - MRI delayed 2/2 misplaced MR-compatible O2 probe - IR LP deferred pending imaging 2/2 concern for discitis/osteo of unknown location   Exam: Vitals:   09/25/20 1900 09/25/20 1915  BP: 101/63 106/63  Pulse: 97 95  Resp: (!) 24 (!) 24  Temp: 98.24 F (36.8 C) 98.24 F (36.8 C)  SpO2: 97% 93%   Pt not examined off sedation today 2/2 EEG being performed; pt required fentanyl to cooperate  MS: opens eyes but does not follow commands Speech: intubated CN: PERRL, (+) corneals, oculocephalics, cough, gag Motor & sensory: withdraws to noxious stimuli though not briskly in all extremities   Impression: This is a 45 yo patient with hx IVDA, hep C, DJD, anemia, asthma, hx MSSA bacteremia, 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   Recommendations:  - Continue empiric coverage for CNS infection - MRI brain, c, t and l spine with and without contrast ordered (contrast imperative given sepsis indicating infection) but unable to be performed currently 2/2 missing O2 probe - CTs ordered and pending - Given her hx  discitis/osteo of unknown location plan for IR LP tomorrow (studies to incl cell count in 2 tubes, protein, glucose, crypto antigen, HSV, VZV, culture) - Continued medical mgmt by ICU - EEG pending  Bing Neighbors, MD [facility] [phone]  If 7pm- 7am, please page neurology on call as listed in AMION.

## 2020-09-25 NOTE — Progress Notes (Signed)
Initial Nutrition Assessment  DOCUMENTATION CODES:   Non-severe (moderate) malnutrition in context of chronic illness  INTERVENTION:   Initiate Vital 1.2 @ trickle rate of 15ml/hr- Once patient is tolerating, advance by 48ml/hr q 8 hours until goal rate of 89ml/hr is reached.   Free water flushes 72ml q4 hours to maintain tube patency   Regimen provides 1296kcal/day, 81g/day protein and 1024ml/day free water   Pt at high refeed risk; recommend monitor potassium, magnesium and phosphorus labs daily until stable  NUTRITION DIAGNOSIS:   Moderate Malnutrition related to chronic illness (cirrhosis, substance abuse) as evidenced by moderate fat depletion,severe muscle depletion.  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 She presented to ED after acute onset AMS with abdominal pain and was found to be in severe sepsis with high fevers up to 106 and was ultimately intubated and admitted to ICU.  Pt sedated and ventilated. OGT in place. Plan is to start trickle feeds today. Pt is at high refeed risk. RD suspects pt with poor oral intake at baseline r/t substance abuse as pt is malnourished.   Per chart, pt is down 8lbs(7%) since 08/2019; RD unsure how recently weight loss occurred.   Medications reviewed and include: colace, lovenox, folic acid, solu-cortef, hydromorphone, reglan, MVI, oxycodone, protonix, miralax, thiamine, NaCl @100ml /hr, omnipen, ceftriaxone, fentanyl, phenylephrine, vancomycin, vasopression  Labs reviewed: BUN 27(H), K 3.9 wnl, P 4.0 wnl, Mg 2.6(H) Wbc- 22.8(H), Hgb 8.4(L), Hct 26.0(L) cbgs- 117, 163, 138 x 24 hrs  Patient is  currently intubated on ventilator support MV: 9.7 L/min Temp (24hrs), Avg:97.7 F (36.5 C), Min:96.8 F (36 C), Max:98.6 F (37 C)  Propofol: none   MAP- >73mmHg  UOP- 77m  NUTRITION - FOCUSED PHYSICAL EXAM:  Flowsheet Row Most Recent Value  Orbital Region Moderate depletion  Upper Arm Region Moderate depletion  Thoracic and Lumbar Region Moderate depletion  Buccal Region Moderate depletion  Temple Region Severe depletion  Clavicle Bone Region Severe depletion  Clavicle and Acromion Bone Region Severe depletion  Scapular Bone Region Severe depletion  Dorsal Hand Moderate depletion  Patellar Region Severe depletion  Anterior Thigh Region Severe depletion  Posterior Calf Region Severe depletion  Edema (RD Assessment) None  Hair Reviewed  Eyes Reviewed  Mouth Reviewed  Skin Reviewed  Nails Reviewed     Diet Order:   Diet Order            Diet NPO time specified  Diet effective now                EDUCATION NEEDS:   No education needs have been identified at this time  Skin:  Skin Assessment: Reviewed RN Assessment (ecchymosis)  Last BM:  pta  Height:   Ht Readings from Last 1 Encounters:  09/23/20 5' (1.524 m)    Weight:   Wt Readings from Last 1 Encounters:  09/23/20 48.7 kg    Ideal Body Weight:  45.45 kg  BMI:  Body mass index is 20.97 kg/m.  Estimated Nutritional Needs:   Kcal:  1277kcal/day  Protein:  75-85g/day  Fluid:  1.5-1.7L/day  09/25/20 MS, RD, LDN Please refer to [facility] for RD and/or RD on-call/weekend/after hours pager

## 2020-09-25 NOTE — Progress Notes (Signed)
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  Pregnancy test came back negative. given 1 L bolus of IV lactated Ringer, 4 mg IV Zofran twice, 1 g of IV Toradol and IV Zosyn   INTERIM HISTORY SEVERE SEPSIS, HIGH FEVERS SEVERE ENCEPHALOPATHY PCCM WILL TAKE OVER  Micro Data:  COVID NEG HIV NEG INF A/B NEG 4 years ago MRSA bacteremia 08/2019-1 year ago MSSA bacteremia and TRICUSPID VALVE ENDOCARDITIS 08/2018 SPINAL INFECTIONS   Antibiotics Given (last 72 hours)    Date/Time Action Medication Dose Rate   09/23/20 0632 New Bag/Given   cefTRIAXone (ROCEPHIN) 2 g in sodium chloride 0.9 % 100 mL IVPB 2 g 200 mL/hr   09/23/20 0653 New Bag/Given   metroNIDAZOLE (FLAGYL) IVPB 500 mg 500 mg 100 mL/hr   09/23/20 1110 New Bag/Given   metroNIDAZOLE (FLAGYL) IVPB 500 mg 500 mg 100 mL/hr   09/23/20 2021 New Bag/Given   metroNIDAZOLE (FLAGYL) IVPB 500 mg 500 mg 100 mL/hr   09/24/20 0511 New Bag/Given   metroNIDAZOLE (FLAGYL) IVPB 500 mg 500 mg 100 mL/hr   09/24/20 0630 New Bag/Given   cefTRIAXone (ROCEPHIN) 2 g in sodium chloride 0.9 % 100 mL IVPB 2 g 200 mL/hr   09/24/20 1346 New Bag/Given   vancomycin (VANCOREADY) IVPB 1000 mg/200 mL 1,000 mg 200 mL/hr   09/24/20 1506 New Bag/Given   ampicillin (OMNIPEN) 2 g in sodium chloride 0.9 % 100 mL IVPB 2 g 300 mL/hr   09/24/20 1548 New Bag/Given   acyclovir (ZOVIRAX) 485 mg in dextrose 5 % 100 mL IVPB 485 mg 109.7 mL/hr   09/24/20 1655 New Bag/Given   ampicillin (OMNIPEN) 2 g in sodium chloride 0.9 % 100 mL IVPB 2 g 300 mL/hr    09/24/20 2015 New Bag/Given   ampicillin (OMNIPEN) 2 g in sodium chloride 0.9 % 100 mL IVPB 2 g 300 mL/hr   09/24/20 2145 New Bag/Given   cefTRIAXone (ROCEPHIN) 2 g in sodium chloride 0.9 % 100 mL IVPB 2 g 200 mL/hr   09/24/20 2223 New Bag/Given   acyclovir (ZOVIRAX) 485 mg in dextrose 5 % 100 mL IVPB 485 mg 109.7 mL/hr   09/24/20 2342 New Bag/Given   ampicillin (OMNIPEN) 2 g in sodium chloride 0.9 % 100 mL IVPB 2 g 300 mL/hr   09/25/20 0109 New Bag/Given   vancomycin (VANCOREADY) IVPB 500 mg/100 mL 500 mg 100 mL/hr   09/25/20 0335 New Bag/Given   ampicillin (OMNIPEN) 2 g in sodium chloride 0.9 % 100 mL IVPB 2 g 300 mL/hr   09/25/20 2426 New Bag/Given   acyclovir (ZOVIRAX) 485 mg in dextrose 5 % 100 mL IVPB 485 mg 109.7 mL/hr   09/25/20 0739 New Bag/Given   ampicillin (OMNIPEN) 2 g in sodium chloride 0.9 % 100 mL IVPB 2 g 300 mL/hr         Significant Hospital Events: Including procedures, antibiotic start and stop dates in addition to other pertinent events    5/23 admitted for mental status changes  5/24 severe encephalopathy and very high fevers 106  5/24 emergently intubated  5/25 severe sepsis  and resp failure, remains critically ill     Interim History / Subjective:  Remains critically ill Remains on vent On pressors MRI brain and spine pending  Objective   Blood pressure 101/71, pulse 76, temperature 97.7 F (36.5 C), resp. rate (!) 24, height 5' (1.524 m), weight 48.7 kg, SpO2 95 %. CVP:  [12 mmHg-14 mmHg] 12 mmHg  Vent Mode: PRVC FiO2 (%):  [40 %-100 %] 40 % Set Rate:  [16 bmp-24 bmp] 24 bmp Vt Set:  [400 mL] 400 mL PEEP:  [5 cmH20-8 cmH20] 5 cmH20 Plateau Pressure:  [22 cmH20] 22 cmH20   Intake/Output Summary (Last 24 hours) at 09/25/2020 0831 Last data filed at 09/25/2020 0600 Gross per 24 hour  Intake 5899.5 ml  Output 1880 ml  Net 4019.5 ml   Filed Weights   09/23/20 0032 09/23/20 0845  Weight: 54.4 kg 48.7 kg      REVIEW OF  SYSTEMS  PATIENT IS UNABLE TO PROVIDE COMPLETE REVIEW OF SYSTEMS DUE TO SEVERE CRITICAL ILLNESS AND TOXIC METABOLIC ENCEPHALOPATHY   PHYSICAL EXAMINATION:  GENERAL:critically ill appearing, +resp distress HEAD: Normocephalic, atraumatic.  EYES: Pupils equal, round, reactive to light.  No scleral icterus.  MOUTH: Moist mucosal membrane. NECK: Supple. PULMONARY: +rhonchi, +wheezing CARDIOVASCULAR: S1 and S2. Regular rate and rhythm. No murmurs, rubs, or gallops.  GASTROINTESTINAL: Soft, nontender, -distended. Positive bowel sounds.  MUSCULOSKELETAL: No swelling, clubbing, or edema.  NEUROLOGIC: obtunded SKIN:intact,warm,dry   Labs/imaging that I havepersonally reviewed  (right click and "Reselect all SmartList Selections" daily)       ASSESSMENT AND PLAN SYNOPSIS   45 yo white female with multiple medical issues with severe sepsis with high fevers with metabolic acidosis with severe hypoxic respiratory failure with severe toxic metabolic encephalopathy leading to severe acute decompensation with very high risk for cardiac arrest, patient emergently intubated, I ma concerned for septic emboli and or severe infections encephalitis    Severe ACUTE Hypoxic and Hypercapnic Respiratory Failure -continue Mechanical Ventilator support -continue Bronchodilator Therapy -Wean Fio2 and PEEP as tolerated -VAP/VENT bundle implementation  SEVERE ASTHMA  EXACERBATION -continue IV steroids as prescribed -continue NEB THERAPY as prescribed  CARDIAC FAILURE-h/o endocarditis Will need TEE -follow up cardiac enzymes as indicated   CARDIAC ICU monitoring   NEUROLOGY Acute toxic metabolic encephalopathy, need for sedation Goal RASS -2 to -3 Will need MRI brain Will need LP  SEPTIC SHOCK -use vasopressors to keep MAP>65 as needed -follow ABG and LA -follow up cultures -emperic ABX -consider stress dose steroids -aggressive IV fluid resuscitation  INFECTIOUS DISEASE h/o MSSA  endocarditis -continue antibiotics as prescribed -follow up cultures Will need MRI brain, CT chest -continue antibiotics as prescribed -follow up cultures -follow up ID consultation Need to consider LP, NEUROLOGY CONSULTED Will empirically treat for infectious encephalitis  ENDO - ICU hypoglycemic\Hyperglycemia protocol -check FSBS per protocol   GI GI PROPHYLAXIS as indicated  NUTRITIONAL STATUS DIET-->TF's as tolerated Constipation protocol as indicated   ELECTROLYTES -follow labs as needed -replace as needed -pharmacy consultation and following    Best practice (right click and "Reselect all SmartList Selections" daily)  Diet:  NPO Pain/Anxiety/Delirium protocol (if indicated): Yes (RASS goal -2) VAP protocol (if indicated): Yes DVT prophylaxis: Contraindicated GI prophylaxis:H2B Glucose control:  SSI Yes Central venous access:  Yes, and it is still needed Foley:  N/A Mobility: B ED REST Code Status:FULL Disposition: ICU     Labs   CBC: Recent Labs  Lab 09/23/20 0616 09/23/20 0744 09/23/20 1511 09/24/20  7564 09/24/20 0433 09/25/20 0413  WBC 16.6* 16.7* 14.8* 13.6* 11.1* 22.8*  NEUTROABS 15.5* 15.5*  --  12.3*  --  19.8*  HGB 7.8* 9.1* 8.8* 8.8* 8.2* 8.4*  HCT 23.8* 27.8* 26.6* 27.1* 24.7* 26.0*  MCV 81.5 79.0* 80.1 80.4 78.4* 81.0  PLT 137* 141* 111* 88* 76* 61*    Basic Metabolic Panel: Recent Labs  Lab 09/23/20 0616 09/23/20 1511 09/24/20 0056 09/24/20 0433 09/25/20 0413  NA 134* 136 131* 135 140  K 3.4* 3.4* 2.8* 3.5 3.9  CL 99 100 102 104 111  CO2 22 25 21* 22 19*  GLUCOSE 142* 135* 133* 127* 125*  BUN 14 16 17 16  27*  CREATININE 0.94 0.82 0.84 0.73 0.93  CALCIUM 8.1* 7.8* 7.5* 7.6* 7.1*  MG  --  1.5* 1.4* 2.6* 2.6*  PHOS  --  3.1  --   --  4.0   GFR: Estimated Creatinine Clearance: 54.3 mL/min (by C-G formula based on SCr of 0.93 mg/dL). Recent Labs  Lab 09/23/20 0744 09/23/20 1511 09/24/20 0056 09/24/20 0433  09/24/20 1003 09/24/20 1600 09/24/20 2021 09/24/20 2243 09/25/20 0413  PROCALCITON 38.81  --   --   --   --   --   --   --  >150.00  WBC 16.7* 14.8* 13.6* 11.1*  --   --   --   --  22.8*  LATICACIDVEN 3.4* 2.0* 1.1 1.0 1.6 1.5 2.0* 2.2*  --     Liver Function Tests: Recent Labs  Lab 09/23/20 0616 09/23/20 1511 09/24/20 0056 09/24/20 0433 09/25/20 0413  AST 27 23 19 16  71*  ALT 12 10 11 10 31   ALKPHOS 96 85 81 71 58  BILITOT 0.6 0.5 0.9 0.8 0.8  PROT 7.0 6.7 6.7 6.4* 5.7*  ALBUMIN 2.7* 2.7* 2.7* 2.8* 2.5*   Recent Labs  Lab 09/23/20 0043  LIPASE 25   Recent Labs  Lab 09/23/20 0302  AMMONIA 26    ABG    Component Value Date/Time   PHART 7.31 (L) 09/25/2020 0446   PCO2ART 36 09/25/2020 0446   PO2ART 111 (H) 09/25/2020 0446   HCO3 18.2 (L) 09/25/2020 0446   ACIDBASEDEF 7.6 (H) 09/25/2020 0446   O2SAT 98.1 09/25/2020 0446     Coagulation Profile: Recent Labs  Lab 09/23/20 0616 09/23/20 0744 09/24/20 1003  INR 2.5* 1.3* 1.4*    Cardiac Enzymes: Recent Labs  Lab 09/24/20 1003  CKTOTAL 10*    HbA1C: No results found for: HGBA1C  CBG: Recent Labs  Lab 09/24/20 1600 09/24/20 1927 09/24/20 2328 09/25/20 0415 09/25/20 0733  GLUCAP 99 129* 186* 117* 163*    Allergies No Known Allergies     DVT/GI PRX  assessed I Assessed the need for Labs I Assessed the need for Foley I Assessed the need for Central Venous Line Family Discussion when available I Assessed the need for Mobilization I made an Assessment of medications to be adjusted accordingly Safety Risk assessment completed  CASE DISCUSSED IN MULTIDISCIPLINARY ROUNDS WITH ICU TEAM     Critical Care Time devoted to patient care services described in this note is 60 minutes.  Critical care was necessary to treat or prevent imminent or life-threatening deterioration. Overall, patient is critically ill, prognosis is guarded.  Patient with Multiorgan failure and at high risk for cardiac  arrest and death.    09/26/20, M.D.  09/27/20 Pulmonary & Critical Care Medicine  Medical Director [facility] Medical Director [facility] Cardio-Pulmonary Department

## 2020-09-26 ENCOUNTER — Inpatient Hospital Stay: Payer: Medicaid Other

## 2020-09-26 ENCOUNTER — Encounter
Admission: EM | Discharge status: Against Medical Advice | Payer: Self-pay | Source: Home / Self Care | Attending: Internal Medicine

## 2020-09-26 ENCOUNTER — Inpatient Hospital Stay: Payer: Self-pay

## 2020-09-26 ENCOUNTER — Inpatient Hospital Stay
Admit: 2020-09-26 | Discharge: 2020-09-26 | Disposition: A | Discharge status: Home / Self Care | Payer: Medicaid Other | Attending: Internal Medicine | Admitting: Internal Medicine

## 2020-09-26 DIAGNOSIS — R1084 Generalized abdominal pain: Secondary | ICD-10-CM

## 2020-09-26 DIAGNOSIS — J9601 Acute respiratory failure with hypoxia: Secondary | ICD-10-CM | POA: Diagnosis not present

## 2020-09-26 DIAGNOSIS — A419 Sepsis, unspecified organism: Secondary | ICD-10-CM

## 2020-09-26 DIAGNOSIS — R6521 Severe sepsis with septic shock: Secondary | ICD-10-CM

## 2020-09-26 DIAGNOSIS — R4020 Unspecified coma: Secondary | ICD-10-CM | POA: Diagnosis not present

## 2020-09-26 DIAGNOSIS — R4182 Altered mental status, unspecified: Secondary | ICD-10-CM | POA: Diagnosis not present

## 2020-09-26 DIAGNOSIS — R112 Nausea with vomiting, unspecified: Secondary | ICD-10-CM | POA: Diagnosis not present

## 2020-09-26 DIAGNOSIS — K7460 Unspecified cirrhosis of liver: Secondary | ICD-10-CM | POA: Diagnosis not present

## 2020-09-26 LAB — CBC WITH DIFFERENTIAL/PLATELET
Abs Immature Granulocytes: 0.29 10*3/uL — ABNORMAL HIGH (ref 0.00–0.07)
Basophils Absolute: 0 10*3/uL (ref 0.0–0.1)
Basophils Relative: 0 %
Eosinophils Absolute: 0 10*3/uL (ref 0.0–0.5)
Eosinophils Relative: 0 %
HCT: 25.8 % — ABNORMAL LOW (ref 36.0–46.0)
Hemoglobin: 8.6 g/dL — ABNORMAL LOW (ref 12.0–15.0)
Immature Granulocytes: 2 %
Lymphocytes Relative: 11 %
Lymphs Abs: 1.7 10*3/uL (ref 0.7–4.0)
MCH: 26 pg (ref 26.0–34.0)
MCHC: 33.3 g/dL (ref 30.0–36.0)
MCV: 77.9 fL — ABNORMAL LOW (ref 80.0–100.0)
Monocytes Absolute: 0.9 10*3/uL (ref 0.1–1.0)
Monocytes Relative: 5 %
Neutro Abs: 13.6 10*3/uL — ABNORMAL HIGH (ref 1.7–7.7)
Neutrophils Relative %: 82 %
Platelets: 66 10*3/uL — ABNORMAL LOW (ref 150–400)
RBC: 3.31 MIL/uL — ABNORMAL LOW (ref 3.87–5.11)
RDW: 16 % — ABNORMAL HIGH (ref 11.5–15.5)
WBC: 16.6 10*3/uL — ABNORMAL HIGH (ref 4.0–10.5)
nRBC: 0 % (ref 0.0–0.2)

## 2020-09-26 LAB — COMPREHENSIVE METABOLIC PANEL WITH GFR
ALT: 25 U/L (ref 0–44)
AST: 18 U/L (ref 15–41)
Albumin: 2.5 g/dL — ABNORMAL LOW (ref 3.5–5.0)
Alkaline Phosphatase: 63 U/L (ref 38–126)
Anion gap: 8 (ref 5–15)
BUN: 24 mg/dL — ABNORMAL HIGH (ref 6–20)
CO2: 21 mmol/L — ABNORMAL LOW (ref 22–32)
Calcium: 7.8 mg/dL — ABNORMAL LOW (ref 8.9–10.3)
Chloride: 116 mmol/L — ABNORMAL HIGH (ref 98–111)
Creatinine, Ser: 0.83 mg/dL (ref 0.44–1.00)
GFR, Estimated: >60 mL/min
Glucose, Bld: 153 mg/dL — ABNORMAL HIGH (ref 70–99)
Potassium: 3.4 mmol/L — ABNORMAL LOW (ref 3.5–5.1)
Sodium: 145 mmol/L (ref 135–145)
Total Bilirubin: 0.6 mg/dL (ref 0.3–1.2)
Total Protein: 6.1 g/dL — ABNORMAL LOW (ref 6.5–8.1)

## 2020-09-26 LAB — GLUCOSE, CAPILLARY
Glucose-Capillary: 149 mg/dL — ABNORMAL HIGH (ref 70–99)
Glucose-Capillary: 156 mg/dL — ABNORMAL HIGH (ref 70–99)
Glucose-Capillary: 132 mg/dL — ABNORMAL HIGH (ref 70–99)
Glucose-Capillary: 141 mg/dL — ABNORMAL HIGH (ref 70–99)
Glucose-Capillary: 129 mg/dL — ABNORMAL HIGH (ref 70–99)
Glucose-Capillary: 125 mg/dL — ABNORMAL HIGH (ref 70–99)

## 2020-09-26 LAB — MYCOPLASMA PNEUMONIAE ANTIBODY, IGM: Mycoplasma pneumo IgM: <770 U/mL (ref 0–769)

## 2020-09-26 LAB — PROCALCITONIN: Procalcitonin: >150 ng/mL

## 2020-09-26 LAB — PHOSPHORUS: Phosphorus: 2.4 mg/dL — ABNORMAL LOW (ref 2.5–4.6)

## 2020-09-26 LAB — MAGNESIUM: Magnesium: 2.9 mg/dL — ABNORMAL HIGH (ref 1.7–2.4)

## 2020-09-26 SURGERY — ECHOCARDIOGRAM, TRANSESOPHAGEAL
Anesthesia: Moderate Sedation

## 2020-09-26 MED ORDER — DIAZEPAM 2 MG PO TABS
5.0000 mg | ORAL_TABLET | Freq: Four times a day (QID) | ORAL | Status: DC
  Administered 2020-09-26 – 2020-09-29 (×10): 5 mg
  Filled 2020-09-26 (×12): qty 3

## 2020-09-26 MED ORDER — POTASSIUM & SODIUM PHOSPHATES 280-160-250 MG PO PACK
1.0000 | PACK | Freq: Three times a day (TID) | ORAL | Status: DC
  Administered 2020-09-26: 1 via ORAL
  Filled 2020-09-26 (×2): qty 1

## 2020-09-26 MED ORDER — POTASSIUM & SODIUM PHOSPHATES 280-160-250 MG PO PACK
1.0000 | PACK | Freq: Three times a day (TID) | ORAL | Status: DC
  Administered 2020-09-26 – 2020-09-29 (×12): 1
  Filled 2020-09-26 (×15): qty 1

## 2020-09-26 MED ORDER — OXYCODONE HCL 5 MG/5ML PO SOLN
10.0000 mg | Freq: Four times a day (QID) | ORAL | Status: DC
  Administered 2020-09-26 – 2020-09-29 (×12): 10 mg
  Filled 2020-09-26 (×12): qty 10

## 2020-09-26 MED ORDER — CHLORHEXIDINE GLUCONATE CLOTH 2 % EX PADS
6.0000 | MEDICATED_PAD | Freq: Every day | CUTANEOUS | Status: DC
  Administered 2020-09-26 – 2020-09-28 (×3): 6 via TOPICAL

## 2020-09-26 MED ORDER — SODIUM CHLORIDE 0.9% FLUSH: 10.0000 mL | INTRAVENOUS | Status: DC | PRN

## 2020-09-26 MED ORDER — VITAL AF 1.2 CAL PO LIQD
1000.0000 mL | ORAL | Status: DC
  Administered 2020-09-26 – 2020-09-28 (×3): 1000 mL

## 2020-09-26 MED ORDER — THIAMINE HCL 100 MG PO TABS
100.0000 mg | ORAL_TABLET | Freq: Every day | ORAL | Status: DC
  Administered 2020-09-26 – 2020-09-29 (×3): 100 mg
  Filled 2020-09-26 (×3): qty 1

## 2020-09-26 MED ORDER — PROPOFOL 1000 MG/100ML IV EMUL
INTRAVENOUS | Status: AC
  Administered 2020-09-26: 20 ug/kg/min via INTRAVENOUS
  Filled 2020-09-26: qty 100

## 2020-09-26 MED ORDER — PROPOFOL 1000 MG/100ML IV EMUL
5.0000 ug/kg/min | INTRAVENOUS | Status: DC
  Administered 2020-09-26 – 2020-09-27 (×2): 20 ug/kg/min via INTRAVENOUS
  Administered 2020-09-27: 10 ug/kg/min via INTRAVENOUS
  Administered 2020-09-28 (×2): 30 ug/kg/min via INTRAVENOUS
  Administered 2020-09-28: 35 ug/kg/min via INTRAVENOUS
  Administered 2020-09-29 (×2): 50 ug/kg/min via INTRAVENOUS
  Filled 2020-09-26 (×8): qty 100

## 2020-09-26 MED ORDER — ONDANSETRON HCL 4 MG PO TABS
4.0000 mg | ORAL_TABLET | Freq: Four times a day (QID) | ORAL | Status: DC | PRN
Start: 2020-09-26 — End: 2020-09-29

## 2020-09-26 MED ORDER — ONDANSETRON HCL 4 MG/2ML IJ SOLN
4.0000 mg | Freq: Four times a day (QID) | INTRAMUSCULAR | Status: DC | PRN
Start: 2020-09-26 — End: 2020-09-29

## 2020-09-26 MED ORDER — THIAMINE HCL 100 MG/ML IJ SOLN
100.0000 mg | Freq: Every day | INTRAMUSCULAR | Status: DC
  Administered 2020-09-28: 100 mg via INTRAVENOUS
  Filled 2020-09-26: qty 2

## 2020-09-26 MED ORDER — SODIUM CHLORIDE 0.9% FLUSH
10.0000 mL | Freq: Two times a day (BID) | INTRAVENOUS | Status: DC
  Administered 2020-09-26 – 2020-09-27 (×2): 10 mL
  Administered 2020-09-27: 30 mL
  Administered 2020-09-28 – 2020-09-29 (×3): 10 mL

## 2020-09-26 NOTE — Progress Notes (Signed)
   Daily Progress Note   Patient Name: Andrea King       Date: 09/26/2020 DOB: 1975-06-30  Age: 45 y.o. MRN#: [id number] Attending Physician: Erin Fulling, MD Primary Care Physician: Alberteen Spindle, CNM Admit Date: 09/23/2020  Reason for Consultation/Follow-up: Establishing goals of care  Subjective: Chart Reviewed. Updates Received. Patient Assessed.   Patient remains in critical condition. Obtunded. Intubated.  Does not follow commands. No family at the bedside.   Patient's uncle Jillyn Hidden) and aunt Jennette Kettle) called requesting additional updates. Acknowledged conversations and updates provided from attending. Re-emphasized similar updates and highly encouraged family to consider DNR in the setting of severe respiratory failure, concerns for infection, multiorgan failure, and little to no chance of a meaningful recovery.   Family states they have discussed after speaking with Dr. Belia Heman and would like to continue with aggressive interventions and for patient to remain a full code. I explained what a full code situation would look like for [name] and her quality of life. They verbalized understanding with continued comparison of her previous illnesses and hospitalization. Family is remaining hopeful patient will "pull through again!"   All questions answered.    Length of Stay: 3 days  Vital Signs: BP 95/67   Pulse 82   Temp (!) 97.52 F (36.4 C)   Resp (!) 24   Ht 5' (1.524 m)   Wt 48.7 kg   LMP  (LMP Unknown)   SpO2 99%   BMI 20.97 kg/m  SpO2: SpO2: 99 % O2 Device: O2 Device: Ventilator O2 Flow Rate: O2 Flow Rate (L/min): 2 L/min  Physical Exam: Obtunded, intubated Will not follow commands             Palliative Care Assessment & Plan  HPI: Palliative Care consult requested for goals of care discussion in this 45 y.o. 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  Patient presented to the ED via private vehicle with concerns for acute altered mental status, abdominal pain, and intractable nausea and vomiting.  Head CT showed no acute abnormalities.  Abdominal CT showed questionable gallbladder wall thickening, hepatosplenomegaly, there is perihepatic and pelvic ascites with multiple pulmonary nodules.  UDS negative.  Since admission patient developed severe hypoxic respiratory failure requiring emergent intubation.  Code Status:  Full code  Goals of Care/Recommendations:  Extensive updates provided to family in addition to Dr. Belia Heman. Family understands the severity of patient's condition and high risk of sudden death/decline. Despite discussions they are remaining hopeful of her improvement and survival. Requesting full court press/full code.   PMT will continue to follow.   Prognosis: POOR   Thank you for allowing the Palliative Medicine Team to assist in the care of this patient.  Time Total: 45 min  Visit consisted of counseling and education dealing with the complex and emotionally intense issues of symptom management and palliative care in the setting of serious and potentially life-threatening illness.Greater than 50%  of this time was spent counseling and coordinating care related to the above assessment and plan.  Willette Alma, AGPCNP-BC  Palliative Medicine Team [phone]

## 2020-09-26 NOTE — CV Procedure (Signed)
Transesophageal echocardiogram preliminary report  Andrea King [id number] [birth year]  Preliminary diagnosis  Bacteremia with possible endocarditis  Postprocedural diagnosis  Bacteremia with evidence of Tricuspid valve endocarditis  Time out A timeout was performed by the nursing staff and physicians specifically identifying the procedure performed, identification of the patient, the type of sedation, all allergies and medications, all pertinent medical history, and presedation assessment of nasopharynx. The patient and or family understand the risks of the procedure including the rare risks of death, stroke, heart attack, esophogeal perforation, sore throat, and reaction to medications given.  Moderate sedation During this procedure the patient has received continued intravenous sedation from metered sedation for intubation and respiration to achieve appropriate moderate sedation.  The patient had continued monitoring of heart rate, oxygenation, blood pressure, respiratory rate, and extent of signs of sedation throughout the entire procedure.  The patient received this moderate sedation over a period of 22 minutes.  Both the nursing staff and I were present during the procedure when the patient had moderate sedation for 100% of the time.  Treatment considerations  Continuation of antibiotic treatment for Tricuspid valve endocarditis  For further details of transesophageal echocardiogram please refer to final report.  Signed,  Lamar Blinks M.D. [facility] 09/26/2020 2:10 PM

## 2020-09-26 NOTE — Progress Notes (Signed)
Peripherally Inserted Central Catheter Placement  The IV Nurse has discussed with the patient and/or persons authorized to consent for the patient, the purpose of this procedure and the potential benefits and risks involved with this procedure.  The benefits include less needle sticks, lab draws from the catheter, and the patient may be discharged home with the catheter. Risks include, but not limited to, infection, bleeding, blood clot (thrombus formation), and puncture of an artery; nerve damage and irregular heartbeat and possibility to perform a PICC exchange if needed/ordered by physician.  Alternatives to this procedure were also discussed.  Bard Power PICC patient education guide, fact sheet on infection prevention and patient information card has been provided to patient /or left at bedside.    PICC Placement Documentation  PICC Triple Lumen 09/26/20 PICC Right Basilic 34 cm 1 cm (Active)  Indication for Insertion or Continuance of Line Vasoactive infusions 09/26/20 1705  Exposed Catheter (cm) 1 cm 09/26/20 1705  Site Assessment Clean;Dry;Intact 09/26/20 1705  Lumen #1 Status Flushed;Saline locked;Blood return noted 09/26/20 1705  Lumen #2 Status Flushed;Saline locked;Blood return noted 09/26/20 1705  Lumen #3 Status Flushed;Saline locked;Blood return noted 09/26/20 1705  Dressing Type Transparent;Securing device 09/26/20 1705  Dressing Status Clean;Dry;Intact 09/26/20 1705  Antimicrobial disc in place? Yes 09/26/20 1705  Dressing Intervention New dressing 09/26/20 1705  Dressing Change Due 10/03/20 09/26/20 1705    Consent signed by Doylene Bode, via telephone,  verified by 2 IV Team RNs.   Annett Fabian 09/26/2020, 5:07 PM

## 2020-09-26 NOTE — Progress Notes (Signed)
GOALS OF CARE DISCUSSION  The Clinical status was relayed to family in detail. Uncle Andrea King and Aunty Andrea King   Updated and notified of patients medical condition.  Patient remains unresponsive and will not open eyes to command.   Patient is having a weak cough and struggling to remove secretions.   Patient with increased WOB and using accessory muscles to breathe Explained to family course of therapy and the modalities     Patient with Progressive multiorgan failure with a very high probablity of a very minimal chance of meaningful recovery despite all aggressive and optimal medical therapy.  PATIENT REMAINS FULL CODE  Family understands the situation. Severe sepsis and encephalopathy  Family are satisfied with Plan of action and management. All questions answered  Additional CC time 35 mins   Livingston Denner Santiago Glad, M.D.  [facility]  Medical Director [facility] Medical Director [facility] Cardio-Pulmonary Department

## 2020-09-26 NOTE — Progress Notes (Signed)
  Patient remains unresponsive and will not open eyes to command.  Patient is having a weak cough and struggling to remove secretions.  Patient with increased WOB and using accessory muscles to breathe  Explained to family course of therapy and the modalities     TEE shows 1 cm vegetation on Tricuspid valve-plan for ID consultation for culture  Case discussed with NEURO-plan for LP tomorrow Case discussed with IR  Patient with Progressive multiorgan failure with a very high probablity of a very minimal chance of meaningful recovery despite all aggressive and optimal medical therapy.    PATIENT REMAINS FULL CODE I have encouraged family to consider DNR status  Family are satisfied with Plan of action and management. All questions answered  Additional CC time 18 mins   Nini Cavan Santiago Glad, M.D.  [facility]  Medical Director [facility] Medical Director [facility] Cardio-Pulmonary Department

## 2020-09-26 NOTE — Progress Notes (Signed)
Neurology Progress Note  Patient visit and examination attempted but unable to be performed bc patient was undergoing TEE.  Will f/u tomorrow.  Bing Neighbors, MD [facility] [phone]  If 7pm- 7am, please page neurology on call as listed in AMION.

## 2020-09-26 NOTE — Plan of Care (Signed)
Pt without any significant event during shift. Family updated x 2 with plan of care. Weaning off of Levophed as appropriate. Handoff report given to [facility]. Relinquishing care Problem: Education: Goal: Knowledge of General Education information will improve Description: Including pain rating scale, medication(s)/side effects and non-pharmacologic comfort measures Outcome: Progressing   Problem: Health Behavior/Discharge Planning: Goal: Ability to manage health-related needs will improve Outcome: Progressing   Problem: Clinical Measurements: Goal: Ability to maintain clinical measurements within normal limits will improve Outcome: Progressing Goal: Will remain free from infection Outcome: Progressing Goal: Diagnostic test results will improve Outcome: Progressing Goal: Respiratory complications will improve Outcome: Progressing Goal: Cardiovascular complication will be avoided Outcome: Progressing   Problem: Activity: Goal: Risk for activity intolerance will decrease Outcome: Progressing   Problem: Nutrition: Goal: Adequate nutrition will be maintained Outcome: Progressing   Problem: Coping: Goal: Level of anxiety will decrease Outcome: Progressing   Problem: Elimination: Goal: Will not experience complications related to bowel motility Outcome: Progressing Goal: Will not experience complications related to urinary retention Outcome: Progressing   Problem: Pain Managment: Goal: General experience of comfort will improve Outcome: Progressing   Problem: Safety: Goal: Ability to remain free from injury will improve Outcome: Progressing   Problem: Skin Integrity: Goal: Risk for impaired skin integrity will decrease Outcome: Progressing

## 2020-09-26 NOTE — Plan of Care (Signed)
  Problem: Nutrition: Goal: Adequate nutrition will be maintained Outcome: Progressing   Problem: Safety: Goal: Ability to remain free from injury will improve Outcome: Progressing   Problem: Clinical Measurements: Goal: Will remain free from infection Outcome: Not Progressing

## 2020-09-26 NOTE — Consult Note (Signed)
[facility] Consultation Note  Patient ID: Andrea King, MRN: [id number], DOB/AGE: 10-04-1975 45 y.o. Admit date: 09/23/2020   Date of Consult: 09/26/2020 Primary Physician: [name], CNM Primary Cardiologist: None  Chief Complaint:  Chief Complaint  Patient presents with  . Emesis   Reason for Consult: Endocarditis  HPI: 45 y.o. 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  She now comes with septic emboli to her lungs as well as methicillin sensitive Staph aureus and osteomyelitis with bacteremia.  The patient has been intubated and respirated due to the sepsis and is currently hemodynamically stable.  After further consent from the family members to perform a transesophageal echocardiogram it does appear that the patient has normal LV systolic function with ejection fraction of 60%.  Mitral valve has minimal regurgitation but prosthetic tricuspid valve has significant vegetation with moderate tricuspid regurgitation.  Past Medical History:  Diagnosis Date  . Anemia   . Asthma   . DJD (degenerative joint disease)   . Hepatitis C   . Substance abuse [facility])       Surgical History:  Past Surgical History:  Procedure Laterality Date  . INGUINAL HERNIA REPAIR Left 04/28/2013   Procedure: HERNIA REPAIR INGUINAL ADULT with mesh;  Surgeon: Gwenyth Ober, MD;  Location: [place];  Service: General;  Laterality: Left;  . TEE WITHOUT CARDIOVERSION N/A 11/18/2015   Procedure: TRANSESOPHAGEAL ECHOCARDIOGRAM (TEE);  Surgeon: Wellington Hampshire, MD;  Location: [facility];  Service: Cardiovascular;  Laterality: N/A;  . TEE WITHOUT CARDIOVERSION N/A 12/31/2017   Procedure: TRANSESOPHAGEAL ECHOCARDIOGRAM (TEE);  Surgeon: Teodoro Spray, MD;  Location: [facility];  Service: Cardiovascular;  Laterality: N/A;  . TEE WITHOUT CARDIOVERSION N/A 01/04/2018    Procedure: TRANSESOPHAGEAL ECHOCARDIOGRAM (TEE);  Surgeon: Teodoro Spray, MD;  Location: [facility];  Service: Cardiovascular;  Laterality: N/A;  . tricuspid valve replacement    . TUMOR EXCISION     left scalpula     Home Meds: Prior to Admission medications   Medication Sig Start Date End Date Taking? Authorizing Provider  amoxicillin-clavulanate (AUGMENTIN) 875-125 MG tablet Take 1 tablet by mouth 2 (two) times daily. Patient not taking: Reported on 09/23/2020    [provider]  aspirin EC 81 MG tablet Take 81 mg by mouth daily. Patient not taking: Reported on 09/23/2020    [provider]  Buprenorphine HCl-Naloxone HCl 8-2 MG FILM Place under the tongue. Patient not taking: Reported on 09/23/2020    [provider]  doxycycline (VIBRAMYCIN) 100 MG capsule Take 100 mg by mouth 2 (two) times daily. Patient not taking: Reported on 09/23/2020    [provider]  gabapentin (NEURONTIN) 300 MG capsule Take 300 mg by mouth 3 (three) times daily. Take 2 caps in the AM, midday, and 3 caps at night Patient not taking: Reported on 09/23/2020    [provider]  hydrOXYzine (VISTARIL) 25 MG capsule Take 25 mg by mouth 4 (four) times daily. Patient not taking: Reported on 09/23/2020    [provider]    Inpatient Medications:  . chlorhexidine gluconate (MEDLINE KIT)  15 mL Mouth Rinse BID  . Chlorhexidine Gluconate Cloth  6 each Topical Daily  . diazepam  5 mg Per Tube Q6H  . docusate  100 mg Per Tube BID  . enoxaparin (LOVENOX) injection  40 mg Subcutaneous Q24H  .  folic acid  1 mg Per Tube Daily  . free water  30 mL Per Tube Q4H  . hydrocortisone sod succinate (SOLU-CORTEF) inj  50 mg Intravenous Q6H  .  HYDROmorphone (DILAUDID) injection  1 mg Intravenous Once  . mouth rinse  15 mL Mouth Rinse 10 times per day  . metoCLOPramide (REGLAN) injection  10 mg Intravenous Q6H  . multivitamin with minerals  1 tablet Per Tube Daily  . oxyCODONE   10 mg Per Tube Q6H  . pantoprazole (PROTONIX) IV  40 mg Intravenous Q12H  . polyethylene glycol  17 g Per Tube Daily  . potassium & sodium phosphates  1 packet Per Tube TID WC & HS  . thiamine  100 mg Per Tube Daily   Or  . thiamine  100 mg Intravenous Daily   . sodium chloride 100 mL/hr at 09/26/20 1237  . acyclovir 485 mg (09/26/20 1358)  . ampicillin (OMNIPEN) IV 2 g (09/26/20 1242)  . anidulafungin Stopped (09/26/20 0314)  . cefTRIAXone (ROCEPHIN)  IV 2 g (09/26/20 1050)  . feeding supplement (VITAL AF 1.2 CAL)    . HYDROmorphone 4 mg/hr (09/26/20 0800)  . midazolam 10 mg/hr (09/26/20 0800)  . norepinephrine (LEVOPHED) Adult infusion Stopped (09/26/20 0800)  . vancomycin Stopped (09/26/20 0505)    Allergies: No Known Allergies  Social History   Socioeconomic History  . Marital status: Single    Spouse name: Not on file  . Number of children: Not on file  . Years of education: Not on file  . Highest education level: Not on file  Occupational History  . Occupation: unemployed  Tobacco Use  . Smoking status: Current Every Day Smoker    Types: Cigarettes  . Smokeless tobacco: Never Used  Substance and Sexual Activity  . Alcohol use: No    Alcohol/week: 0.0 standard drinks  . Drug use: Yes    Comment: cocaine,Heroine  . Sexual activity: Yes    Birth control/protection: None  Other Topics Concern  . Not on file  Social History Narrative  . Not on file   Social Determinants of Health   Financial Resource Strain: Not on file  Food Insecurity: Not on file  Transportation Needs: Not on file  Physical Activity: Not on file  Stress: Not on file  Social Connections: Not on file  Intimate Partner Violence: Not on file     Family History  Problem Relation Age of Onset  . Cancer Mother   . Cancer - Colon Maternal Grandfather   . Diabetes Maternal Grandfather      Review of Systems Cannot assess Labs: Recent Labs    09/24/20 1003  CKTOTAL 10*   Lab Results   Component Value Date   WBC 16.6 (H) 09/26/2020   HGB 8.6 (L) 09/26/2020   HCT 25.8 (L) 09/26/2020   MCV 77.9 (L) 09/26/2020   PLT 66 (L) 09/26/2020    Recent Labs  Lab 09/26/20 0525  NA 145  K 3.4*  CL 116*  CO2 21*  BUN 24*  CREATININE 0.83  CALCIUM 7.8*  PROT 6.1*  BILITOT 0.6  ALKPHOS 63  ALT 25  AST 18  GLUCOSE 153*   Lab Results  Component Value Date   TRIG 129 09/25/2020   Lab Results  Component Value Date   DDIMER 8.09 (H) 09/24/2020    Radiology/Studies:  CT ABDOMEN PELVIS WO CONTRAST  Addendum Date: 09/23/2020   ADDENDUM REPORT: 09/23/2020 03:32 ADDENDUM: Please note findings and impression should note  cirrhosis with portal hypertension. Electronically Signed   By: Iven Finn M.D.   On: 09/23/2020 03:32   Result Date: 09/23/2020 CLINICAL DATA:  Nonlocalized acute abdominal pain. History of septic emboli in 2021. EXAM: CT ABDOMEN AND PELVIS WITHOUT CONTRAST TECHNIQUE: Multidetector CT imaging of the abdomen and pelvis was performed following the standard protocol without IV contrast. COMPARISON:  Chest x-ray 09/18/2018, CT abdomen pelvis 04/08/2013 FINDINGS: Lower chest: Subsegmental atelectasis with question of a 1.6 x 0.6 cm lesion within the right lower lobe. Question subpleural micronodule just superiorly (3:1). Subpleural pulmonary micronodule within the left lower lobe (3:6). 6 mm pulmonary nodule within the right middle lobe (3:6). Subpleural nodule measuring 5 mm within the right middle lobe (3:8). Replace tricuspid valve.  Retained epicardial pacing wires. Hepatobiliary: The liver is enlarged in size measuring up to 24 cm. No focal liver abnormality. Question of gallbladder wall thickening and pericholecystic fluid. No CT findings gallbladder stones. No biliary dilatation. Pancreas: No focal lesion. Normal pancreatic contour. No surrounding inflammatory changes. No main pancreatic ductal dilatation. Spleen: The spleen is enlarged measuring up to 15.5 cm.  Question inferior splenic lesion versus adjacent colon (5:56). Adrenals/Urinary Tract: No adrenal nodule bilaterally. No nephrolithiasis, no hydronephrosis, and no contour-deforming renal mass. No ureterolithiasis or hydroureter. The urinary bladder is unremarkable. Stomach/Bowel: Stomach is within normal limits. No evidence of bowel wall thickening or dilatation. No pneumatosis. Appendix appears normal. Vascular/Lymphatic: No abdominal aorta or iliac aneurysm. Mild atherosclerotic plaque of the aorta and its branches. No abdominal, pelvic, or inguinal lymphadenopathy. Reproductive: Uterus and bilateral adnexa/ovaries are unremarkable. Other: Trace free perihepatic fluid. Trace free pelvic fluid. No intraperitoneal free gas. No organized fluid collection. Musculoskeletal: Small left inguinal hernia containing fat and likely decompressed loop of small bowel. No suspicious lytic or blastic osseous lesions. No acute displaced fracture. 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 Fusion of the L5-S1 vertebral bodies. IMPRESSION: 1. Question gallbladder wall thickening and pericholecystic fluid. Recommend right upper quadrant ultrasound for a more sensitive evaluation. 2. Hepatosplenomegaly with question of an inferior splenic lesion versus adjacent small bowel. 3. Small left inguinal hernia containing fat and likely decompressed short loop of small bowel. No definite associated bowel obstruction. Limited evaluation for ischemia on this noncontrast study with no definite pneumatosis identified. 4. Trace perihepatic and pelvic ascites. 5. Visualized lungs demonstrate multiple pulmonary nodules. Non-contrast chest CT at 3-6 months is recommended. If the nodules are stable at time of repeat CT, then future CT at 18-24 months (from today's scan) is considered optional for low-risk patients, but is recommended for high-risk patients. This recommendation follows the consensus  statement: Guidelines for Management of Incidental Pulmonary Nodules Detected on CT Images: From the Fleischner Society 2017; Radiology 2017; 284:228-243. Electronically Signed: By: Iven Finn M.D. On: 09/23/2020 02:36   DG Chest 1 View  Result Date: 09/26/2020 CLINICAL DATA:  Central line placement EXAM: CHEST  1 VIEW COMPARISON:  09/25/2020 FINDINGS: Endotracheal tube with tip 1 cm above the carina. Right IJ line with tip near the upper SVC. The enteric tube reaches the stomach. Normal heart size and mediastinal contours accounting for rotation. Tricuspid valve replacement. Indistinct airspace disease of the bilateral chest with small pleural effusions. There is multifocal pneumonia by thoracic spine CT yesterday. IMPRESSION: 1. Unremarkable hardware positioning. 2. Multifocal pneumonia. Electronically Signed   By: Monte Fantasia M.D.   On: 09/26/2020 07:50   DG Abd 1 View  Result Date: 09/25/2020  CLINICAL DATA:  Acute respiratory failure with hypoxia and OG tube placement EXAM: PORTABLE AP CHEST 1 VIEW Portable AP semi erect after view COMPARISON:  Chest x-ray 09/24/2020, chest x-ray 09/23/2020. FINDINGS: Endotracheal tube terminates 2 cm above the carina. Enteric tube courses below the hemidiaphragm with tip and side port overlying the gastric lumen. Right internal jugular central venous catheter with tip overlying the expected region of the superior cavoatrial junction. Retained epicardial pacing wires. The heart size and mediastinal contours are unchanged. Cardiac valve replacement again noted. improved aeration of the right mid lung zone with persistent bilateral patchy airspace and interstitial opacities. No pulmonary edema. No pleural effusion. No pneumothorax. Nonobstructive bowel gas pattern. No acute osseous abnormality. Partially visualized surgical hardware of the cervical spine. IMPRESSION: 1. Improved aeration of the right mid lung zone with persistent bilateral patchy airspace and  interstitial opacities. 2. Lines and tubes are stable in position. Electronically Signed   By: Iven Finn M.D.   On: 09/25/2020 04:38   CT HEAD WO CONTRAST  Result Date: 09/25/2020 CLINICAL DATA:  Anoxic brain damage. EXAM: CT HEAD WITHOUT CONTRAST TECHNIQUE: Contiguous axial images were obtained from the base of the skull through the vertex without intravenous contrast. COMPARISON:  Head CT examinations 09/23/2020 and earlier FINDINGS: Brain: Cerebral volume is normal for age. There is no acute intracranial hemorrhage. No demarcated cortical infarct. Gray-white differentiation is preserved. No extra-axial fluid collection. No evidence of intracranial mass. No midline shift. Vascular: No hyperdense vessel. Skull: Normal. Negative for fracture or focal lesion. Sinuses/Orbits: Visualized orbits show no acute finding. No significant paranasal sinus disease at the imaged levels. IMPRESSION: No CT evidence of acute intracranial abnormality. A non-contrast brain MRI would have greater sensitivity for acute hypoxic/ischemic injury. Electronically Signed   By: Kellie Simmering DO   On: 09/25/2020 19:25   CT Head Wo Contrast  Result Date: 09/23/2020 CLINICAL DATA:  Delirium EXAM: CT HEAD WITHOUT CONTRAST TECHNIQUE: Contiguous axial images were obtained from the base of the skull through the vertex without intravenous contrast. COMPARISON:  None. FINDINGS: Brain: There is no mass, hemorrhage or extra-axial collection. The size and configuration of the ventricles and extra-axial CSF spaces are normal. The brain parenchyma is normal, without acute or chronic infarction. Vascular: No abnormal hyperdensity of the major intracranial arteries or dural venous sinuses. No intracranial atherosclerosis. Skull: The visualized skull base, calvarium and extracranial soft tissues are normal. Sinuses/Orbits: No fluid levels or advanced mucosal thickening of the visualized paranasal sinuses. No mastoid or middle ear effusion. The  orbits are normal. IMPRESSION: Normal head CT. Electronically Signed   By: Ulyses Jarred M.D.   On: 09/23/2020 03:49   CT CERVICAL SPINE WO CONTRAST  Result Date: 09/25/2020 CLINICAL DATA:  Initial evaluation for epidural abscess. EXAM: CT CERVICAL, THORACIC, AND LUMBAR SPINE WITHOUT CONTRAST TECHNIQUE: Multidetector CT imaging of the cervical, thoracic and lumbar spine was performed without intravenous contrast. Multiplanar CT image reconstructions were also generated. COMPARISON:  None. FINDINGS: CT CERVICAL SPINE FINDINGS Alignment: Straightening of the normal cervical lordosis. No listhesis. Skull base and vertebrae: Visualized skull base intact without abnormality. Normal C1-2 articulations are preserved. Dens is intact. Patient is status post prior anterior fusion at C3 through C7, with posterior fusion at C2 through T2. Prior corpectomy with strut in place at C4 through C6. Periprosthetic lucency about the transpedicular screws at T1 and T2 suggestive of loosening. Additional lucency seen about the left transpedicular screw at C4, also suggestive of loosening. Hardware itself  is intact. No visible fracture or other acute osseous abnormality. No discrete or worrisome osseous lesions. No findings to suggest osteomyelitis discitis. Soft tissues and spinal canal: Endotracheal and enteric tubes in place. Associated layering fluid within the hypopharynx. Right-sided central venous catheter in place. Paraspinous soft tissues demonstrate no other acute finding. No visible epidural abscess or other collection within the spinal canal, although evaluation somewhat limited by streak artifact from fusion hardware. Disc levels: Postsurgical changes as above. No residual spinal stenosis. Upper chest: Emphysema. Superimposed layering bilateral pleural effusions. CT THORACIC SPINE FINDINGS Alignment: Physiologic with preservation of the normal thoracic kyphosis. No listhesis. Vertebrae: Anterior posterior fusion extending  to the T2 level again noted. Vertebral body height maintained without fracture. No findings to suggest osteomyelitis discitis. No discrete or worrisome osseous lesions. Paraspinal and other soft tissues: Mild diffuse anasarca noted within the visualized soft tissues. Soft tissues otherwise unremarkable without discrete collection. Endotracheal and enteric tubes in place. Right-sided central venous catheter in place. Emphysematous changes noted within the lungs. Layering bilateral pleural effusions. Patchy multifocal opacity seen throughout both lungs, concerning for multifocal pneumonia. Disc levels: No visible epidural abscess or other collection. No significant disc pathology or stenosis seen within the thoracic spine. CT LUMBAR SPINE FINDINGS Segmentation: Standard. Alignment: Chronic bilateral pars defects at L5 with associated 5 mm spondylolisthesis of L5 on S1. Alignment otherwise normal with preservation of the normal lumbar lordosis. Vertebrae: Chronic fusion/ankylosis across the L5-S1 interspace. Vertebral body height maintained without acute or chronic fracture. Visualized sacrum and pelvis intact. No discrete or worrisome osseous lesions. No findings to suggest acute osteomyelitis discitis or septic arthritis. Paraspinal and other soft tissues: Diffuse anasarca. Enteric tube partially visualized. Additional catheter tubing partially visualized overlying the mid pelvis. Probable right femoral approach central venous catheter partially visualized as well. Diffuse stranding with moderate volume free fluid within the visualized abdomen. No visible epidural abscess or other collection within the spinal canal itself. Disc levels: T12-L1: Mild disc bulge with annular calcification. No significant stenosis. L1-2:  Unremarkable. L2-3:  Negative interspace.  Mild facet hypertrophy.  No stenosis. L3-4:  Negative interspace.  Mild facet hypertrophy.  No stenosis. L4-5: Negative interspace. Moderate to advanced right  worse than left facet hypertrophy. No significant stenosis. 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 Moderate to severe facet arthrosis. Spinal canal remains patent. Moderate to severe bilateral L5 foraminal narrowing. IMPRESSION: 1. No CT evidence for acute abnormality within the cervical, thoracic, or lumbar spine. No visible epidural abscess or other collection. 2. Postoperative changes from prior anterior fusion at C3 through C7, with posterior fusion at C2 through T2. Periprosthetic lucency about the transpedicular screws on the left at C4 and bilaterally at T1 and T2, suggestive of loosening. 3. Layering bilateral pleural effusions with patchy multifocal opacity throughout both lungs, concerning for multifocal pneumonia. 4. Diffuse stranding throughout the visualized abdomen and pelvis with associated moderate volume free fluid. Anasarca. 5. 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 Associated moderate to severe bilateral L5 foraminal stenosis. 6. Emphysema (ICD10-J43.9). Electronically Signed   By: Jeannine Boga M.D.   On: 09/25/2020 19:33   CT THORACIC SPINE WO CONTRAST  Result Date: 09/25/2020 CLINICAL DATA:  Initial evaluation for epidural abscess. EXAM: CT CERVICAL, THORACIC, AND LUMBAR SPINE WITHOUT CONTRAST TECHNIQUE: Multidetector CT imaging of the cervical, thoracic and lumbar spine was performed without intravenous contrast. Multiplanar CT image reconstructions were also generated. COMPARISON:  None.  FINDINGS: CT CERVICAL SPINE FINDINGS Alignment: Straightening of the normal cervical lordosis. No listhesis. Skull base and vertebrae: Visualized skull base intact without abnormality. Normal C1-2 articulations are preserved. Dens is intact. Patient is status post prior anterior fusion at C3 through C7, with posterior fusion at C2 through T2. Prior corpectomy with strut in place at C4 through C6. Periprosthetic  lucency about the transpedicular screws at T1 and T2 suggestive of loosening. Additional lucency seen about the left transpedicular screw at C4, also suggestive of loosening. Hardware itself is intact. No visible fracture or other acute osseous abnormality. No discrete or worrisome osseous lesions. No findings to suggest osteomyelitis discitis. Soft tissues and spinal canal: Endotracheal and enteric tubes in place. Associated layering fluid within the hypopharynx. Right-sided central venous catheter in place. Paraspinous soft tissues demonstrate no other acute finding. No visible epidural abscess or other collection within the spinal canal, although evaluation somewhat limited by streak artifact from fusion hardware. Disc levels: Postsurgical changes as above. No residual spinal stenosis. Upper chest: Emphysema. Superimposed layering bilateral pleural effusions. CT THORACIC SPINE FINDINGS Alignment: Physiologic with preservation of the normal thoracic kyphosis. No listhesis. Vertebrae: Anterior posterior fusion extending to the T2 level again noted. Vertebral body height maintained without fracture. No findings to suggest osteomyelitis discitis. No discrete or worrisome osseous lesions. Paraspinal and other soft tissues: Mild diffuse anasarca noted within the visualized soft tissues. Soft tissues otherwise unremarkable without discrete collection. Endotracheal and enteric tubes in place. Right-sided central venous catheter in place. Emphysematous changes noted within the lungs. Layering bilateral pleural effusions. Patchy multifocal opacity seen throughout both lungs, concerning for multifocal pneumonia. Disc levels: No visible epidural abscess or other collection. No significant disc pathology or stenosis seen within the thoracic spine. CT LUMBAR SPINE FINDINGS Segmentation: Standard. Alignment: Chronic bilateral pars defects at L5 with associated 5 mm spondylolisthesis of L5 on S1. Alignment otherwise normal with  preservation of the normal lumbar lordosis. Vertebrae: Chronic fusion/ankylosis across the L5-S1 interspace. Vertebral body height maintained without acute or chronic fracture. Visualized sacrum and pelvis intact. No discrete or worrisome osseous lesions. No findings to suggest acute osteomyelitis discitis or septic arthritis. Paraspinal and other soft tissues: Diffuse anasarca. Enteric tube partially visualized. Additional catheter tubing partially visualized overlying the mid pelvis. Probable right femoral approach central venous catheter partially visualized as well. Diffuse stranding with moderate volume free fluid within the visualized abdomen. No visible epidural abscess or other collection within the spinal canal itself. Disc levels: T12-L1: Mild disc bulge with annular calcification. No significant stenosis. L1-2:  Unremarkable. L2-3:  Negative interspace.  Mild facet hypertrophy.  No stenosis. L3-4:  Negative interspace.  Mild facet hypertrophy.  No stenosis. L4-5: Negative interspace. Moderate to advanced right worse than left facet hypertrophy. No significant stenosis. 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 Moderate to severe facet arthrosis. Spinal canal remains patent. Moderate to severe bilateral L5 foraminal narrowing. IMPRESSION: 1. No CT evidence for acute abnormality within the cervical, thoracic, or lumbar spine. No visible epidural abscess or other collection. 2. Postoperative changes from prior anterior fusion at C3 through C7, with posterior fusion at C2 through T2. Periprosthetic lucency about the transpedicular screws on the left at C4 and bilaterally at T1 and T2, suggestive of loosening. 3. Layering bilateral pleural effusions with patchy multifocal opacity throughout both lungs, concerning for multifocal pneumonia. 4. Diffuse stranding throughout the visualized abdomen and pelvis with associated moderate volume free fluid. Anasarca. 5. Chronic  bilateral pars defects  at L5 with associated 5 mm spondylolisthesis of L5 on S1. Associated moderate to severe bilateral L5 foraminal stenosis. 6. Emphysema (ICD10-J43.9). Electronically Signed   By: Jeannine Boga M.D.   On: 09/25/2020 19:33   CT LUMBAR SPINE WO CONTRAST  Result Date: 09/25/2020 CLINICAL DATA:  Initial evaluation for epidural abscess. EXAM: CT CERVICAL, THORACIC, AND LUMBAR SPINE WITHOUT CONTRAST TECHNIQUE: Multidetector CT imaging of the cervical, thoracic and lumbar spine was performed without intravenous contrast. Multiplanar CT image reconstructions were also generated. COMPARISON:  None. FINDINGS: CT CERVICAL SPINE FINDINGS Alignment: Straightening of the normal cervical lordosis. No listhesis. Skull base and vertebrae: Visualized skull base intact without abnormality. Normal C1-2 articulations are preserved. Dens is intact. Patient is status post prior anterior fusion at C3 through C7, with posterior fusion at C2 through T2. Prior corpectomy with strut in place at C4 through C6. Periprosthetic lucency about the transpedicular screws at T1 and T2 suggestive of loosening. Additional lucency seen about the left transpedicular screw at C4, also suggestive of loosening. Hardware itself is intact. No visible fracture or other acute osseous abnormality. No discrete or worrisome osseous lesions. No findings to suggest osteomyelitis discitis. Soft tissues and spinal canal: Endotracheal and enteric tubes in place. Associated layering fluid within the hypopharynx. Right-sided central venous catheter in place. Paraspinous soft tissues demonstrate no other acute finding. No visible epidural abscess or other collection within the spinal canal, although evaluation somewhat limited by streak artifact from fusion hardware. Disc levels: Postsurgical changes as above. No residual spinal stenosis. Upper chest: Emphysema. Superimposed layering bilateral pleural effusions. CT THORACIC SPINE FINDINGS  Alignment: Physiologic with preservation of the normal thoracic kyphosis. No listhesis. Vertebrae: Anterior posterior fusion extending to the T2 level again noted. Vertebral body height maintained without fracture. No findings to suggest osteomyelitis discitis. No discrete or worrisome osseous lesions. Paraspinal and other soft tissues: Mild diffuse anasarca noted within the visualized soft tissues. Soft tissues otherwise unremarkable without discrete collection. Endotracheal and enteric tubes in place. Right-sided central venous catheter in place. Emphysematous changes noted within the lungs. Layering bilateral pleural effusions. Patchy multifocal opacity seen throughout both lungs, concerning for multifocal pneumonia. Disc levels: No visible epidural abscess or other collection. No significant disc pathology or stenosis seen within the thoracic spine. CT LUMBAR SPINE FINDINGS Segmentation: Standard. Alignment: Chronic bilateral pars defects at L5 with associated 5 mm spondylolisthesis of L5 on S1. Alignment otherwise normal with preservation of the normal lumbar lordosis. Vertebrae: Chronic fusion/ankylosis across the L5-S1 interspace. Vertebral body height maintained without acute or chronic fracture. Visualized sacrum and pelvis intact. No discrete or worrisome osseous lesions. No findings to suggest acute osteomyelitis discitis or septic arthritis. Paraspinal and other soft tissues: Diffuse anasarca. Enteric tube partially visualized. Additional catheter tubing partially visualized overlying the mid pelvis. Probable right femoral approach central venous catheter partially visualized as well. Diffuse stranding with moderate volume free fluid within the visualized abdomen. No visible epidural abscess or other collection within the spinal canal itself. Disc levels: T12-L1: Mild disc bulge with annular calcification. No significant stenosis. L1-2:  Unremarkable. L2-3:  Negative interspace.  Mild facet hypertrophy.   No stenosis. L3-4:  Negative interspace.  Mild facet hypertrophy.  No stenosis. L4-5: Negative interspace. Moderate to advanced right worse than left facet hypertrophy. No significant stenosis. 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 Moderate to severe facet arthrosis. Spinal canal remains patent. Moderate to severe bilateral L5 foraminal narrowing. IMPRESSION: 1. No CT evidence for acute abnormality  within the cervical, thoracic, or lumbar spine. No visible epidural abscess or other collection. 2. Postoperative changes from prior anterior fusion at C3 through C7, with posterior fusion at C2 through T2. Periprosthetic lucency about the transpedicular screws on the left at C4 and bilaterally at T1 and T2, suggestive of loosening. 3. Layering bilateral pleural effusions with patchy multifocal opacity throughout both lungs, concerning for multifocal pneumonia. 4. Diffuse stranding throughout the visualized abdomen and pelvis with associated moderate volume free fluid. Anasarca. 5. 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 Associated moderate to severe bilateral L5 foraminal stenosis. 6. Emphysema (ICD10-J43.9). Electronically Signed   By: Jeannine Boga M.D.   On: 09/25/2020 19:33   DG Chest Port 1 View  Result Date: 09/25/2020 CLINICAL DATA:  Acute respiratory failure with hypoxia and OG tube placement EXAM: PORTABLE AP CHEST 1 VIEW Portable AP semi erect after view COMPARISON:  Chest x-ray 09/24/2020, chest x-ray 09/23/2020. FINDINGS: Endotracheal tube terminates 2 cm above the carina. Enteric tube courses below the hemidiaphragm with tip and side port overlying the gastric lumen. Right internal jugular central venous catheter with tip overlying the expected region of the superior cavoatrial junction. Retained epicardial pacing wires. The heart size and mediastinal contours are unchanged. Cardiac valve replacement again  noted. improved aeration of the right mid lung zone with persistent bilateral patchy airspace and interstitial opacities. No pulmonary edema. No pleural effusion. No pneumothorax. Nonobstructive bowel gas pattern. No acute osseous abnormality. Partially visualized surgical hardware of the cervical spine. IMPRESSION: 1. Improved aeration of the right mid lung zone with persistent bilateral patchy airspace and interstitial opacities. 2. Lines and tubes are stable in position. Electronically Signed   By: Iven Finn M.D.   On: 09/25/2020 04:38   DG Chest Port 1 View  Result Date: 09/24/2020 CLINICAL DATA:  Intubation. EXAM: PORTABLE CHEST 1 VIEW COMPARISON:  09/23/2020. FINDINGS: Endotracheal tube tip noted 3 cm above the carina. NG tube tip noted on the left hemidiaphragm. Right IJ line noted at cavoatrial junction. Prior CABG. Heart size stable. Bilateral pulmonary infiltrates/edema. Findings have progressed slightly from prior exam. No pleural effusion or pneumothorax. Prior cervicothoracic spine fusion. IMPRESSION: 1.  Lines and tubes in good anatomic position. 2. Prior CABG. Heart size stable. Diffuse bilateral pulmonary infiltrates/edema, slightly progressed from prior exam. Electronically Signed   By: [place]   On: 09/24/2020 12:34   DG Chest Port 1 View  Result Date: 09/23/2020 CLINICAL DATA:  45 year old female with fever and altered mental status. History of endocarditis, discitis. EXAM: PORTABLE CHEST 1 VIEW COMPARISON:  09/18/2019 portable chest and earlier. FINDINGS: Portable AP upright view at 0805 hours. Extensive anterior and posterior lower cervical and upper thoracic spine fusion hardware appears stable. Prosthetic cardiac valve. Normal cardiac size and mediastinal contours. Left upper chest surgical clips. Stable lung volumes. No pneumothorax, pulmonary edema, pleural effusion. Regression of mild bilateral streaky opacity. No new pulmonary opacity. Paucity of bowel gas.  IMPRESSION: 1. Improved bilateral ventilation and regressed bilateral likely infectious streaky and nodular opacity. 2. No new cardiopulmonary abnormality. Electronically Signed   By: Genevie Ann M.D.   On: 09/23/2020 08:49   EEG adult  Result Date: 09/25/2020 Derek Jack, MD     09/25/2020  8:39 PM Routine EEG Report Alpha L Kasparek is a 45 y.o. female with a history of severe encephalopathy who is undergoing an EEG to evaluate for seizures. EEG was recorded on fentanyl, versed  was paused x30 min prior to recording. Report: This EEG was acquired with electrodes placed according to the International 10-20 electrode system (including Fp1, Fp2, F3, F4, C3, C4, P3, P4, O1, O2, T3, T4, T5, T6, A1, A2, Fz, Cz, Pz). The following electrodes were missing or displaced: none. The best background was 7-8 Hz near-continuous activity with bifrontotemporal slowing and relative suppression over the L temporoparietal region. This activity is reactive to stimulation. There was some sleep architecture (spindles) present). There were no interictal epileptiform discharges. There were no electrographic seizures identified. There was no abnormal response to photic stimulation. Hyperventilation was not performed. Impression and clinical correlation: This EEG was obtained while comatose and is abnormal due to mild diffuse slowing and bifrontotemporal focal slowing indicating generalized and bilateral focal cerebral dysfunction. Su Monks, MD [facility] [phone] If 7pm- 7am, please page neurology on call as listed in [place].   ECHOCARDIOGRAM COMPLETE  Result Date: 09/25/2020    ECHOCARDIOGRAM REPORT   Patient Name:   SHERLEEN PANGBORN Date of Exam: 09/24/2020 Medical Rec #:  [id number]       Height:       60.0 in Accession #:    [id number]      Weight:       107.4 lb Date of Birth:  Apr 27, 1976      BSA:          1.433 m Patient Age:    25 years        BP:           135/78 mmHg Patient Gender: F               HR:            84 bpm. Exam Location:  [facility] Procedure: 2D Echo, Cardiac Doppler and Color Doppler Indications:     I38 Endocarditis  History:         Patient has prior history of Echocardiogram examinations, most                  recent 08/31/2019. Risk Factors:Substance abuse. Hepatitis C.  Sonographer:     Wilford Sports Rodgers-Jones Referring Phys:  [id number] Bradly Bienenstock Diagnosing Phys: Kate Sable MD IMPRESSIONS  1. Left ventricular ejection fraction, by estimation, is 55 to 60%. The left ventricle has normal function. The left ventricle has no regional wall motion abnormalities. Left ventricular diastolic parameters are consistent with Grade II diastolic dysfunction (pseudonormalization).  2. Right ventricular systolic function is normal. The right ventricular size is normal.  3. Left atrial size was mildly dilated.  4. The mitral valve is normal in structure. Mild to moderate mitral valve regurgitation.  5. There is a mobile mass/structure attached to the tricuspid valve leaflet/prosthesis (image [id number]) suggesting a vegetation. recommend obtaining a TEE for better evaluation.. The tricuspid valve is has been repaired/replaced.  6. The aortic valve is grossly normal. Aortic valve regurgitation is not visualized.  7. The inferior vena cava is dilated in size with >50% respiratory variability, suggesting right atrial pressure of 8 mmHg. Conclusion(s)/Recommendation(s): Findings concerning for vegetataion, would recommend Transesophageal Echocardiogram for clarification. FINDINGS  Left Ventricle: Left ventricular ejection fraction, by estimation, is 55 to 60%. The left ventricle has normal function. The left ventricle has no regional wall motion abnormalities. The left ventricular internal cavity size was normal in size. There is  no left ventricular hypertrophy. Left ventricular diastolic parameters are consistent with Grade II diastolic dysfunction (pseudonormalization). Right Ventricle: The right ventricular size  is normal. No increase in right ventricular wall thickness. Right ventricular systolic function is normal. Left Atrium: Left atrial size was mildly dilated. Right Atrium: Right atrial size was normal in size. Pericardium: There is no evidence of pericardial effusion. Mitral Valve: The mitral valve is normal in structure. Mild to moderate mitral valve regurgitation. Tricuspid Valve: There is a mobile mass/structure attached to the tricuspid valve leaflet/prosthesis (image [id number]) suggesting a vegetation. recommend obtaining a TEE for better evaluation. The tricuspid valve is has been repaired/replaced. Tricuspid valve regurgitation is mild. Aortic Valve: The aortic valve is grossly normal. Aortic valve regurgitation is not visualized. Pulmonic Valve: The pulmonic valve was not well visualized. Pulmonic valve regurgitation is not visualized. Aorta: The aortic root is normal in size and structure. Venous: The inferior vena cava is dilated in size with greater than 50% respiratory variability, suggesting right atrial pressure of 8 mmHg. IAS/Shunts: No atrial level shunt detected by color flow Doppler.  LEFT VENTRICLE PLAX 2D LVIDd:         4.49 cm  Diastology LVIDs:         3.15 cm  LV e' medial:    6.53 cm/s LV PW:         0.78 cm  LV E/e' medial:  18.4 LV IVS:        0.85 cm  LV e' lateral:   9.36 cm/s LVOT diam:     1.80 cm  LV E/e' lateral: 12.8 LV SV:         48 LV SV Index:   33 LVOT Area:     2.54 cm  RIGHT VENTRICLE            IVC RV Basal diam:  3.56 cm    IVC diam: 2.13 cm RV S prime:     6.96 cm/s TAPSE (M-mode): 1.1 cm LEFT ATRIUM             Index       RIGHT ATRIUM           Index LA diam:        4.70 cm 3.28 cm/m  RA Area:     11.00 cm LA Vol (A2C):   48.4 ml 33.78 ml/m RA Volume:   25.10 ml  17.52 ml/m LA Vol (A4C):   50.2 ml 35.03 ml/m LA Biplane Vol: 49.4 ml 34.47 ml/m  AORTIC VALVE LVOT Vmax:   100.10 cm/s LVOT Vmean:  63.450 cm/s LVOT VTI:    0.188 m  AORTA Ao Root diam: 2.70 cm MITRAL VALVE                 TRICUSPID VALVE MV Area (PHT): 5.54 cm     TV Peak grad:   6.8 mmHg MV Decel Time: 137 msec     TV Mean grad:   4.0 mmHg MR Peak grad: 116.6 mmHg    TV Vmax:        1.30 m/s MR Mean grad: 83.0 mmHg     TV Vmean:       96.8 cm/s MR Vmax:      540.00 cm/s   TV VTI:         0.35 msec MR Vmean:     433.5 cm/s MV E velocity: 120.00 cm/s  SHUNTS MV A velocity: 63.60 cm/s   Systemic VTI:  0.19 m MV E/A ratio:  1.89         Systemic Diam: 1.80 cm Kate Sable MD Electronically signed by Kate Sable  MD Signature Date/Time: 09/25/2020/5:25:41 PM    Final    [place] EKG SITE RITE  Result Date: 09/26/2020 If Site Rite image not attached, placement could not be confirmed due to current cardiac rhythm.  US ABDOMEN LIMITED RUQ (LIVER/GB)  Result Date: 09/23/2020 CLINICAL DATA:  Pain.  Encephalopathy. EXAM: ULTRASOUND ABDOMEN LIMITED RIGHT UPPER QUADRANT COMPARISON:  CT abdomen pelvis 09/23/2020 FINDINGS: Gallbladder: Gallbladder sludge identified. Enlarged gallbladder wall measuring up to 6 mm. No pericholecystic fluid. No gallstones. No sonographic Murphy sign noted by sonographer. Common bile duct: Diameter: 2 mm. Liver: Nodular hepatic contour. No focal lesion identified. Coarsened heterogeneous parenchymal echogenicity. Portal vein is patent on color Doppler imaging with normal direction of blood flow towards the liver. Other: Increased renal parenchymal echogenicity. IMPRESSION: 1. Gallbladder sludge with gallbladder wall thickening which could be due to chronic liver disease in a patient with cirrhosis. No definite finding of acute cholecystitis or choledocholithiasis. 2. Increased renal parenchymal echogenicity suggestive of renal parenchymal disease. Electronically Signed   By: Iven Finn M.D.   On: 09/23/2020 03:35    EKG: Normal sinus rhythm  Weights: Filed Weights   09/23/20 0032 09/23/20 0845  Weight: 54.4 kg 48.7 kg     Physical Exam: Blood pressure 95/67, pulse 82,  temperature (!) 97.52 F (36.4 C), resp. rate (!) 24, height 5' (1.524 m), weight 48.7 kg, SpO2 99 %. Body mass index is 20.97 kg/m.   Head eyes ears nose throat: Normocephalic, atraumatic, sclera non-icteric, no xanthomas, nares are without discharge. No apparent thyromegaly and/or mass  Lungs: Normal respiratory effort.  no wheezes, no rales, no rhonchi.  Heart: RRR with normal S1 S2.  Left sternal border murmur gallop, no rub, PMI is normal size and placement, carotid upstroke normal without bruit, jugular venous pressure is normal Abdomen: Soft, non-tender, non-distended with normoactive bowel sounds. No hepatomegaly. No rebound/guarding. No obvious abdominal masses. Abdominal aorta is normal size without bruit Extremities: Trace edema. no cyanosis, no clubbing, no ulcers  Peripheral : 2+ bilateral upper extremity pulses, 2+ bilateral femoral pulses, 2+ bilateral dorsal pedal pulse Neuro: Is not alert and oriented. No facial asymmetry. No focal deficit. Moves all extremities spontaneously. Musculoskeletal: Normal muscle tone without kyphosis Psych: Does not responds to questions appropriately with a normal affect.    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  Continue supportive measures of respiratory failure and sepsis from bacteremia and methicillin sensitive Staph aureus 2.  Further treatment long-term up to 6 weeks for endocarditis of tricuspid valve 3.  No further cardiac intervention at this time due to relatively competent tricuspid valve with only  moderate regurgitation at this time.  Signed, Corey Skains M.D. [facility] 09/26/2020, 2:05 PM

## 2020-09-26 NOTE — Progress Notes (Signed)
*  PRELIMINARY RESULTS* Echocardiogram Echocardiogram Transesophageal has been performed.  Andrea King 09/26/2020, 2:18 PM

## 2020-09-26 NOTE — Consult Note (Signed)
[facility]       Reason for Consult:  Prosthetic valve endocarditis Referring Physician: Dr. Belia Heman  Active Problems:   SBP (spontaneous bacterial peritonitis) (HCC)   Encephalopathy   Altered mental status   Generalized abdominal pain   Intractable vomiting   Fever   Cirrhosis of liver without ascites (HCC)   . chlorhexidine gluconate (MEDLINE KIT)  15 mL Mouth Rinse BID  . Chlorhexidine Gluconate Cloth  6 each Topical Daily  . diazepam  5 mg Per Tube Q6H  . docusate  100 mg Per Tube BID  . enoxaparin (LOVENOX) injection  40 mg Subcutaneous Q24H  . folic acid  1 mg Per Tube Daily  . free water  30 mL Per Tube Q4H  . hydrocortisone sod succinate (SOLU-CORTEF) inj  50 mg Intravenous Q6H  .  HYDROmorphone (DILAUDID) injection  1 mg Intravenous Once  . mouth rinse  15 mL Mouth Rinse 10 times per day  . metoCLOPramide (REGLAN) injection  10 mg Intravenous Q6H  . multivitamin with minerals  1 tablet Per Tube Daily  . oxyCODONE  10 mg Per Tube Q6H  . pantoprazole (PROTONIX) IV  40 mg Intravenous Q12H  . polyethylene glycol  17 g Per Tube Daily  . potassium & sodium phosphates  1 packet Per Tube TID WC & HS  . thiamine  100 mg Per Tube Daily   Or  . thiamine  100 mg Intravenous Daily    Recommendations: Agree with MRI, LP Continue vancomycin, ceftriaxone and acyclovir I will stop anidulafungin and ampicillin  Assessment: She has altered mental status and TTE c/w a new endocarditis of the prosthetic TV.  She had a history of a PFO that was repaired so less likely CNS emboli.  No positive blood cultures to date.     Antibiotics: Vancomycin, ceftriaxone, metronidazole, ampicillin, acyclovir, anidulafungin  HPI: Andrea King is a 45 y.o. female with a history of IVDA, TV endocarditis s/p TV replacement in 2017, redo in 2021 due to endocarditis again, spinal infections who comes in now with altered mental status.  This was associated with  abdominal pain, fever to 106.  No history available from the patient who is intubated.    Review of Systems:  Unable to be assessed due to patient factors  Past Medical History:  Diagnosis Date  . Anemia   . Asthma   . DJD (degenerative joint disease)   . Hepatitis C   . Substance abuse (HCC)     Social History   Tobacco Use  . Smoking status: Current Every Day Smoker    Types: Cigarettes  . Smokeless tobacco: Never Used  Substance Use Topics  . Alcohol use: No    Alcohol/week: 0.0 standard drinks  . Drug use: Yes    Comment: cocaine,Heroine    Family History  Problem Relation Age of Onset  . Cancer Mother   . Cancer - Colon Maternal Grandfather   . Diabetes Maternal Grandfather     No Known Allergies  Physical Exam: Constitutional: no response Vitals:   09/26/20 1000 09/26/20 1100  BP: 98/64 95/67  Pulse:  82  Resp: (!) 24 (!) 24  Temp: (!) 97.34 F (36.3 C) (!) 97.52 F (36.4 C)  SpO2: 100% 99%   EYES: anicteric ENMT: +ET Cardiovascular: Cor Tachy Respiratory: respiratory effort on vent GI: Bowel sounds are normal, liver is not enlarged, spleen is not enlarged Musculoskeletal: no pedal edema noted Skin: negatives: no  rash  Lab Results  Component Value Date   WBC 16.6 (H) 09/26/2020   HGB 8.6 (L) 09/26/2020   HCT 25.8 (L) 09/26/2020   MCV 77.9 (L) 09/26/2020   PLT 66 (L) 09/26/2020    Lab Results  Component Value Date   CREATININE 0.83 09/26/2020   BUN 24 (H) 09/26/2020   NA 145 09/26/2020   K 3.4 (L) 09/26/2020   CL 116 (H) 09/26/2020   CO2 21 (L) 09/26/2020    Lab Results  Component Value Date   ALT 25 09/26/2020   AST 18 09/26/2020   ALKPHOS 63 09/26/2020     Microbiology: Recent Results (from the past 240 hour(s))  Resp Panel by RT-PCR (Flu A&B, Covid) Nasopharyngeal Swab     Status: None   Collection Time: 09/23/20  6:16 AM   Specimen: Nasopharyngeal Swab; Nasopharyngeal(NP) swabs in vial transport medium  Result Value Ref  Range Status   SARS Coronavirus 2 by RT PCR NEGATIVE NEGATIVE Final    Comment: (NOTE) SARS-CoV-2 target nucleic acids are NOT DETECTED.  The SARS-CoV-2 RNA is generally detectable in upper respiratory specimens during the acute phase of infection. The lowest concentration of SARS-CoV-2 viral copies this assay can detect is 138 copies/mL. A negative result does not preclude SARS-Cov-2 infection and should not be used as the sole basis for treatment or other patient management decisions. A negative result may occur with  improper specimen collection/handling, submission of specimen other than nasopharyngeal swab, presence of viral mutation(s) within the areas targeted by this assay, and inadequate number of viral copies(<138 copies/mL). A negative result must be combined with clinical observations, patient history, and epidemiological information. The expected result is Negative.  Fact Sheet for Patients:  EntrepreneurPulse.com.au  Fact Sheet for Healthcare Providers:  IncredibleEmployment.be  This test is no t yet approved or cleared by the [place] FDA and  has been authorized for detection and/or diagnosis of SARS-CoV-2 by FDA under an Emergency Use Authorization (EUA). This EUA will remain  in effect (meaning this test can be used) for the duration of the COVID-19 declaration under Section 564(b)(1) of the Act, 21 U.S.C.section 360bbb-3(b)(1), unless the authorization is terminated  or revoked sooner.       Influenza A by PCR NEGATIVE NEGATIVE Final   Influenza B by PCR NEGATIVE NEGATIVE Final    Comment: (NOTE) The Xpert Xpress SARS-CoV-2/FLU/RSV plus assay is intended as an aid in the diagnosis of influenza from Nasopharyngeal swab specimens and should not be used as a sole basis for treatment. Nasal washings and aspirates are unacceptable for Xpert Xpress SARS-CoV-2/FLU/RSV testing.  Fact Sheet for  Patients: EntrepreneurPulse.com.au  Fact Sheet for Healthcare Providers: IncredibleEmployment.be  This test is not yet approved or cleared by the [place] FDA and has been authorized for detection and/or diagnosis of SARS-CoV-2 by FDA under an Emergency Use Authorization (EUA). This EUA will remain in effect (meaning this test can be used) for the duration of the COVID-19 declaration under Section 564(b)(1) of the Act, 21 U.S.C. section 360bbb-3(b)(1), unless the authorization is terminated or revoked.  Performed at [facility], [address] [place], [place] [address]   Culture, blood (x 2)     Status: None (Preliminary result)   Collection Time: 09/23/20  6:54 AM   Specimen: BLOOD  Result Value Ref Range Status   Specimen Description BLOOD RIGHT FA  Final   Special Requests   Final    BOTTLES DRAWN AEROBIC ONLY Blood Culture adequate  volume   Culture   Final    NO GROWTH 2 DAYS Performed at [facility], [address] [place], [place] [address]    Report Status PENDING  Incomplete  MRSA PCR Screening     Status: None   Collection Time: 09/23/20  9:33 AM   Specimen: Nasal Mucosa; Nasopharyngeal  Result Value Ref Range Status   MRSA by PCR NEGATIVE NEGATIVE Final    Comment:        The GeneXpert MRSA Assay (FDA approved for NASAL specimens only), is one component of a comprehensive MRSA colonization surveillance program. It is not intended to diagnose MRSA infection nor to guide or monitor treatment for MRSA infections. Performed at [facility], [address], [place], [place] [address]   Urine Culture     Status: None   Collection Time: 09/24/20 12:51 AM   Specimen: Urine, Random  Result Value Ref Range Status   Specimen Description   Final    URINE, RANDOM Performed at [facility], [address], [place], [place] [address]    Special Requests   Final    NONE Performed at  [facility], [address], [place], [place] [address]    Culture   Final    NO GROWTH Performed at [facility], [place] [address] [place], [place] [address]    Report Status 09/25/2020 FINAL  Final  Culture, Respiratory w Gram Stain     Status: None (Preliminary result)   Collection Time: 09/25/20  7:50 AM   Specimen: Tracheal Aspirate; Respiratory  Result Value Ref Range Status   Specimen Description   Final    TRACHEAL ASPIRATE Performed at [facility], [address] [place], [place] [address]    Special Requests   Final    NONE Performed at [facility], [address] [place], [place] [address]    Gram Stain   Final    RARE WBC PRESENT,BOTH PMN AND MONONUCLEAR NO ORGANISMS SEEN    Culture   Final    CULTURE REINCUBATED FOR BETTER GROWTH Performed at [facility], [place] [address], [place], [place] [address]    Report Status PENDING  Incomplete    Thayer Headings, [place] [facility] [place] [email or web address] 09/26/2020, 2:01 PM

## 2020-09-26 NOTE — Progress Notes (Signed)
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  Pregnancy test came back negative. given 1 L bolus of IV lactated Ringer, 4 mg IV Zofran twice, 1 g of IV Toradol and IV Zosyn  INTERIM HISTORY SEVERE SEPSIS, HIGH FEVERS SEVERE ENCEPHALOPATHY PCCM WILL TAKE OVER  Micro Data:  COVID NEG HIV NEG INF A/B NEG 4 years ago MRSA bacteremia 08/2019-1 year ago MSSA bacteremia and TRICUSPID VALVE ENDOCARDITIS 08/2018 SPINAL INFECTIONS  Antibiotics Given (last 72 hours)    Date/Time Action Medication Dose Rate   09/23/20 1110 New Bag/Given   metroNIDAZOLE (FLAGYL) IVPB 500 mg 500 mg 100 mL/hr   09/23/20 2021 New Bag/Given   metroNIDAZOLE (FLAGYL) IVPB 500 mg 500 mg 100 mL/hr   09/24/20 0511 New Bag/Given   metroNIDAZOLE (FLAGYL) IVPB 500 mg 500 mg 100 mL/hr   09/24/20 0630 New Bag/Given   cefTRIAXone (ROCEPHIN) 2 g in sodium chloride 0.9 % 100 mL IVPB 2 g 200 mL/hr   09/24/20 1346 New Bag/Given   vancomycin (VANCOREADY) IVPB 1000 mg/200 mL 1,000 mg 200 mL/hr   09/24/20 1506 New Bag/Given   ampicillin (OMNIPEN) 2 g in sodium chloride 0.9 % 100 mL IVPB 2 g 300 mL/hr   09/24/20 1548 New Bag/Given   acyclovir (ZOVIRAX) 485 mg in dextrose 5 % 100 mL IVPB 485 mg 109.7 mL/hr   09/24/20 1655 New Bag/Given   ampicillin (OMNIPEN) 2 g in sodium chloride 0.9 % 100 mL IVPB 2 g 300 mL/hr   09/24/20 2015 New Bag/Given   ampicillin (OMNIPEN) 2 g in sodium chloride 0.9 % 100 mL IVPB 2 g 300 mL/hr   09/24/20 2145 New Bag/Given   cefTRIAXone (ROCEPHIN) 2 g  in sodium chloride 0.9 % 100 mL IVPB 2 g 200 mL/hr   09/24/20 2223 New Bag/Given   acyclovir (ZOVIRAX) 485 mg in dextrose 5 % 100 mL IVPB 485 mg 109.7 mL/hr   09/24/20 2342 New Bag/Given   ampicillin (OMNIPEN) 2 g in sodium chloride 0.9 % 100 mL IVPB 2 g 300 mL/hr   09/25/20 0109 New Bag/Given   vancomycin (VANCOREADY) IVPB 500 mg/100 mL 500 mg 100 mL/hr   09/25/20 0335 New Bag/Given   ampicillin (OMNIPEN) 2 g in sodium chloride 0.9 % 100 mL IVPB 2 g 300 mL/hr   09/25/20 0523 New Bag/Given   acyclovir (ZOVIRAX) 485 mg in dextrose 5 % 100 mL IVPB 485 mg 109.7 mL/hr   09/25/20 0739 New Bag/Given   ampicillin (OMNIPEN) 2 g in sodium chloride 0.9 % 100 mL IVPB 2 g 300 mL/hr   09/25/20 7741 New Bag/Given   cefTRIAXone (ROCEPHIN) 2 g in sodium chloride 0.9 % 100 mL IVPB 2 g 200 mL/hr   09/25/20 1243 New Bag/Given   ampicillin (OMNIPEN) 2 g in sodium chloride 0.9 % 100 mL IVPB 2 g 300 mL/hr   09/25/20 1330 New Bag/Given   vancomycin (VANCOREADY) IVPB 500 mg/100 mL 500 mg 100 mL/hr   09/25/20 1502 New Bag/Given   acyclovir (ZOVIRAX) 485 mg in dextrose 5 % 100 mL IVPB 485 mg 109.7 mL/hr  09/25/20 1702 New Bag/Given   ampicillin (OMNIPEN) 2 g in sodium chloride 0.9 % 100 mL IVPB 2 g 300 mL/hr   09/25/20 2055 New Bag/Given   ampicillin (OMNIPEN) 2 g in sodium chloride 0.9 % 100 mL IVPB 2 g 300 mL/hr   09/25/20 2256 New Bag/Given   cefTRIAXone (ROCEPHIN) 2 g in sodium chloride 0.9 % 100 mL IVPB 2 g 200 mL/hr   09/25/20 2330 New Bag/Given   acyclovir (ZOVIRAX) 485 mg in dextrose 5 % 100 mL IVPB 485 mg 109.7 mL/hr   09/26/20 0046 New Bag/Given   ampicillin (OMNIPEN) 2 g in sodium chloride 0.9 % 100 mL IVPB 2 g 300 mL/hr   09/26/20 0405 New Bag/Given   vancomycin (VANCOREADY) IVPB 500 mg/100 mL 500 mg 100 mL/hr   09/26/20 0533 New Bag/Given   ampicillin (OMNIPEN) 2 g in sodium chloride 0.9 % 100 mL IVPB 2 g 300 mL/hr   09/26/20 [id number] New Bag/Given   acyclovir (ZOVIRAX) 485 mg in dextrose 5 % 100  mL IVPB 485 mg 109.7 mL/hr        Interim History / Subjective:  Remains intubated Severe septic shock     Objective   Blood pressure 99/63, pulse 77, temperature (!) 97.52 F (36.4 C), resp. rate (!) 24, height 5' (1.524 m), weight 48.7 kg, SpO2 100 %. CVP:  [13 mmHg-14 mmHg] 14 mmHg  Vent Mode: PRVC FiO2 (%):  [40 %] 40 % Set Rate:  [24 bmp] 24 bmp Vt Set:  [400 mL] 400 mL PEEP:  [5 cmH20] 5 cmH20 Plateau Pressure:  [22 cmH20] 22 cmH20   Intake/Output Summary (Last 24 hours) at 09/26/2020 0743 Last data filed at 09/26/2020 0600 Gross per 24 hour  Intake 3776.62 ml  Output 2150 ml  Net 1626.62 ml   Filed Weights   09/23/20 0032 09/23/20 0845  Weight: 54.4 kg 48.7 kg      REVIEW OF SYSTEMS  PATIENT IS UNABLE TO PROVIDE COMPLETE REVIEW OF SYSTEMS DUE TO SEVERE CRITICAL ILLNESS AND TOXIC METABOLIC ENCEPHALOPATHY   PHYSICAL EXAMINATION:  GENERAL:critically ill appearing, +resp distress HEAD: Normocephalic, atraumatic.  EYES: Pupils equal, round, reactive to light.  No scleral icterus.  MOUTH: Moist mucosal membrane. NECK: Supple. PULMONARY: +rhonchi, +wheezing CARDIOVASCULAR: S1 and S2. Regular rate and rhythm. No murmurs, rubs, or gallops.  GASTROINTESTINAL: Soft, nontender, -distended. Positive bowel sounds.  MUSCULOSKELETAL: No swelling, clubbing, or edema.  NEUROLOGIC: obtunded SKIN:intact,warm,dry     ASSESSMENT AND PLAN SYNOPSIS   45 yo white female with multiple medical issues with severe sepsis with high fevers with metabolic acidosis with severe hypoxic respiratory failure with severe toxic metabolic encephalopathy leading to severe acute decompensation with very high risk for cardiac arrest, patient emergently intubated, I ma concerned for septic emboli and or severe infections encephalitis     Severe ACUTE Hypoxic and Hypercapnic Respiratory Failure -continue Mechanical Ventilator support -continue Bronchodilator Therapy -Wean Fio2 and  PEEP as tolerated -VAP/VENT bundle implementation  SEVERE ASTHMA EXACERBATION -continue IV steroids as prescribed -continue NEB THERAPY as prescribed -morphine as needed -wean fio2 as needed and tolerated   CARDIAC FAILURE-h/o endocarditis Will need TEE -follow up cardiac enzymes as indicated   CARDIAC ICU monitoring    NEUROLOGY Acute toxic metabolic encephalopathy, need for sedation Goal RASS -2 to -3 Await NEURO recs CT spine and head no acute process   SEPTIC SHOCK -use vasopressors to keep MAP>65 as needed -follow ABG and LA -follow up cultures -emperic ABX -stress dose  steroids -aggressive IV fluid resuscitation    INFECTIOUS DISEASE h/o MSSA endocarditis -continue antibiotics as prescribed -follow up cultures Will need MRI brain, CT chest -continue antibiotics as prescribed -follow up cultures -follow up ID consultation Need to consider LP, NEUROLOGY CONSULTED Will empirically treat for infectious encephalitis   ENDO - ICU hypoglycemic\Hyperglycemia protocol -check FSBS per protocol   GI GI PROPHYLAXIS as indicated  NUTRITIONAL STATUS DIET-->TF's as tolerated Constipation protocol as indicated   ELECTROLYTES -follow labs as needed -replace as needed -pharmacy consultation and following    Best practice (right click and "Reselect all SmartList Selections" daily)  Diet:NPO Pain/Anxiety/Delirium protocol (if indicated):Yes(RASS goal -2) VAP protocol (if indicated):Yes DVT prophylaxis:Contraindicated GI prophylaxis:H2B Glucose control:SSIYes Central venous access:Yes, and it is still needed Foley:N/A Mobility:B ED REST Code Status:FULL Disposition:ICU      Labs   CBC: Recent Labs  Lab 09/23/20 0616 09/23/20 0744 09/23/20 1511 09/24/20 0056 09/24/20 0433 09/25/20 0413 09/26/20 0525  WBC 16.6* 16.7* 14.8* 13.6* 11.1* 22.8* 16.6*  NEUTROABS 15.5* 15.5*  --  12.3*  --  19.8* 13.6*  HGB 7.8* 9.1* 8.8* 8.8*  8.2* 8.4* 8.6*  HCT 23.8* 27.8* 26.6* 27.1* 24.7* 26.0* 25.8*  MCV 81.5 79.0* 80.1 80.4 78.4* 81.0 77.9*  PLT 137* 141* 111* 88* 76* 61* 66*    Basic Metabolic Panel: Recent Labs  Lab 09/23/20 1511 09/24/20 0056 09/24/20 0433 09/25/20 0413 09/26/20 0525  NA 136 131* 135 140 145  K 3.4* 2.8* 3.5 3.9 3.4*  CL 100 102 104 111 116*  CO2 25 21* 22 19* 21*  GLUCOSE 135* 133* 127* 125* 153*  BUN 16 17 16  27* 24*  CREATININE 0.82 0.84 0.73 0.93 0.83  CALCIUM 7.8* 7.5* 7.6* 7.1* 7.8*  MG 1.5* 1.4* 2.6* 2.6* 2.9*  PHOS 3.1  --   --  4.0 2.4*   GFR: Estimated Creatinine Clearance: 60.8 mL/min (by C-G formula based on SCr of 0.83 mg/dL). Recent Labs  Lab 09/23/20 0744 09/23/20 1511 09/24/20 0056 09/24/20 0433 09/24/20 1003 09/24/20 1600 09/24/20 2021 09/24/20 2243 09/25/20 0413 09/26/20 0525  PROCALCITON 38.81  --   --   --   --   --   --   --  >150.00 >150.00  WBC 16.7*   < > 13.6* 11.1*  --   --   --   --  22.8* 16.6*  LATICACIDVEN 3.4*   < > 1.1 1.0 1.6 1.5 2.0* 2.2*  --   --    < > = values in this interval not displayed.    Liver Function Tests: Recent Labs  Lab 09/23/20 1511 09/24/20 0056 09/24/20 0433 09/25/20 0413 09/26/20 0525  AST 23 19 16  71* 18  ALT 10 11 10 31 25   ALKPHOS 85 81 71 58 63  BILITOT 0.5 0.9 0.8 0.8 0.6  PROT 6.7 6.7 6.4* 5.7* 6.1*  ALBUMIN 2.7* 2.7* 2.8* 2.5* 2.5*   Recent Labs  Lab 09/23/20 0043  LIPASE 25   Recent Labs  Lab 09/23/20 0302  AMMONIA 26    ABG    Component Value Date/Time   PHART 7.31 (L) 09/25/2020 0446   PCO2ART 36 09/25/2020 0446   PO2ART 111 (H) 09/25/2020 0446   HCO3 18.2 (L) 09/25/2020 0446   ACIDBASEDEF 7.6 (H) 09/25/2020 0446   O2SAT 98.1 09/25/2020 0446     Coagulation Profile: Recent Labs  Lab 09/23/20 0616 09/23/20 0744 09/24/20 1003  INR 2.5* 1.3* 1.4*    Cardiac Enzymes: Recent Labs  Lab 09/24/20 1003  CKTOTAL 10*    HbA1C: No results found for: HGBA1C  CBG: Recent Labs   Lab 09/25/20 1519 09/25/20 1921 09/25/20 2329 09/26/20 0347 09/26/20 0727  GLUCAP 130* 152* 148* 149* 156*    Allergies No Known Allergies     DVT/GI PRX  assessed I Assessed the need for Labs I Assessed the need for Foley I Assessed the need for Central Venous Line Family Discussion when available I Assessed the need for Mobilization I made an Assessment of medications to be adjusted accordingly Safety Risk assessment completed  CASE DISCUSSED IN MULTIDISCIPLINARY ROUNDS WITH ICU TEAM     Critical Care Time devoted to patient care services described in this note is 55 minutes.  Critical care was necessary to treat or prevent imminent or life-threatening deterioration. Overall, patient is critically ill, prognosis is guarded.  Patient with Multiorgan failure and at high risk for cardiac arrest and death.    Lucie Leather, M.D.  [facility]  Medical Director [facility] Medical Director [facility] Cardio-Pulmonary Department

## 2020-09-27 ENCOUNTER — Inpatient Hospital Stay: Payer: Medicaid Other

## 2020-09-27 DIAGNOSIS — I38 Endocarditis, valve unspecified: Secondary | ICD-10-CM

## 2020-09-27 DIAGNOSIS — R4182 Altered mental status, unspecified: Secondary | ICD-10-CM | POA: Diagnosis not present

## 2020-09-27 DIAGNOSIS — J9601 Acute respiratory failure with hypoxia: Secondary | ICD-10-CM | POA: Diagnosis not present

## 2020-09-27 DIAGNOSIS — T826XXA Infection and inflammatory reaction due to cardiac valve prosthesis, initial encounter: Secondary | ICD-10-CM

## 2020-09-27 DIAGNOSIS — R112 Nausea with vomiting, unspecified: Secondary | ICD-10-CM | POA: Diagnosis not present

## 2020-09-27 DIAGNOSIS — K7460 Unspecified cirrhosis of liver: Secondary | ICD-10-CM | POA: Diagnosis not present

## 2020-09-27 LAB — VARICELLA-ZOSTER BY PCR: Varicella-Zoster, PCR: NEGATIVE

## 2020-09-27 LAB — CULTURE, RESPIRATORY W GRAM STAIN

## 2020-09-27 LAB — GLUCOSE, CAPILLARY
Glucose-Capillary: 117 mg/dL — ABNORMAL HIGH (ref 70–99)
Glucose-Capillary: 129 mg/dL — ABNORMAL HIGH (ref 70–99)
Glucose-Capillary: 132 mg/dL — ABNORMAL HIGH (ref 70–99)
Glucose-Capillary: 138 mg/dL — ABNORMAL HIGH (ref 70–99)
Glucose-Capillary: 139 mg/dL — ABNORMAL HIGH (ref 70–99)
Glucose-Capillary: 146 mg/dL — ABNORMAL HIGH (ref 70–99)

## 2020-09-27 LAB — CBC WITH DIFFERENTIAL/PLATELET
Abs Immature Granulocytes: 0.49 10*3/uL — ABNORMAL HIGH (ref 0.00–0.07)
Basophils Absolute: 0.1 10*3/uL (ref 0.0–0.1)
Basophils Relative: 0 %
Eosinophils Absolute: 0 10*3/uL (ref 0.0–0.5)
Eosinophils Relative: 0 %
HCT: 25.4 % — ABNORMAL LOW (ref 36.0–46.0)
Hemoglobin: 8.5 g/dL — ABNORMAL LOW (ref 12.0–15.0)
Immature Granulocytes: 3 %
Lymphocytes Relative: 7 %
Lymphs Abs: 1.4 10*3/uL (ref 0.7–4.0)
MCH: 26.3 pg (ref 26.0–34.0)
MCHC: 33.5 g/dL (ref 30.0–36.0)
MCV: 78.6 fL — ABNORMAL LOW (ref 80.0–100.0)
Monocytes Absolute: 0.9 10*3/uL (ref 0.1–1.0)
Monocytes Relative: 5 %
Neutro Abs: 16.9 10*3/uL — ABNORMAL HIGH (ref 1.7–7.7)
Neutrophils Relative %: 85 %
Platelets: 69 10*3/uL — ABNORMAL LOW (ref 150–400)
RBC: 3.23 MIL/uL — ABNORMAL LOW (ref 3.87–5.11)
RDW: 16.1 % — ABNORMAL HIGH (ref 11.5–15.5)
WBC: 19.8 10*3/uL — ABNORMAL HIGH (ref 4.0–10.5)
nRBC: 0 % (ref 0.0–0.2)

## 2020-09-27 LAB — CSF CELL COUNT WITH DIFFERENTIAL
Eosinophils, CSF: 0 %
Lymphs, CSF: 84 %
Monocyte-Macrophage-Spinal Fluid: 3 %
RBC Count, CSF: 11 /mm3 — ABNORMAL HIGH (ref 0–3)
Segmented Neutrophils-CSF: 13 %
Tube #: 3
WBC, CSF: 3 /mm3 (ref 0–5)

## 2020-09-27 LAB — COMPREHENSIVE METABOLIC PANEL
ALT: 21 U/L (ref 0–44)
AST: 18 U/L (ref 15–41)
Albumin: 2.3 g/dL — ABNORMAL LOW (ref 3.5–5.0)
Alkaline Phosphatase: 63 U/L (ref 38–126)
Anion gap: 5 (ref 5–15)
BUN: 23 mg/dL — ABNORMAL HIGH (ref 6–20)
CO2: 23 mmol/L (ref 22–32)
Calcium: 7.7 mg/dL — ABNORMAL LOW (ref 8.9–10.3)
Chloride: 118 mmol/L — ABNORMAL HIGH (ref 98–111)
Creatinine, Ser: 0.69 mg/dL (ref 0.44–1.00)
GFR, Estimated: >60 mL/min (ref >60)
Glucose, Bld: 142 mg/dL — ABNORMAL HIGH (ref 70–99)
Potassium: 3.2 mmol/L — ABNORMAL LOW (ref 3.5–5.1)
Sodium: 146 mmol/L — ABNORMAL HIGH (ref 135–145)
Total Bilirubin: 0.9 mg/dL (ref 0.3–1.2)
Total Protein: 5.8 g/dL — ABNORMAL LOW (ref 6.5–8.1)

## 2020-09-27 LAB — GLUCOSE, CSF: Glucose, CSF: 86 mg/dL — ABNORMAL HIGH (ref 40–70)

## 2020-09-27 LAB — TRIGLYCERIDES: Triglycerides: 421 mg/dL — ABNORMAL HIGH (ref <150)

## 2020-09-27 LAB — PROTEIN, CSF: Total  Protein, CSF: 37 mg/dL (ref 15–45)

## 2020-09-27 LAB — MAGNESIUM: Magnesium: 2.6 mg/dL — ABNORMAL HIGH (ref 1.7–2.4)

## 2020-09-27 LAB — PHOSPHORUS: Phosphorus: 2.6 mg/dL (ref 2.5–4.6)

## 2020-09-27 LAB — VANCOMYCIN, TROUGH: Vancomycin Tr: 13 ug/mL — ABNORMAL LOW (ref 15–20)

## 2020-09-27 MED ORDER — GENTAMICIN SULFATE 40 MG/ML IJ SOLN
100.0000 mg | Freq: Once | INTRAVENOUS | Status: AC
  Administered 2020-09-27: 100 mg via INTRAVENOUS
  Filled 2020-09-27: qty 2.5

## 2020-09-27 MED ORDER — LIDOCAINE HCL (PF) 1 % IJ SOLN
5.0000 mL | Freq: Once | INTRAMUSCULAR | Status: AC
  Administered 2020-09-27: 5 mL

## 2020-09-27 MED ORDER — SODIUM CHLORIDE 0.9 % IV SOLN
2.0000 g | Freq: Three times a day (TID) | INTRAVENOUS | Status: DC
  Administered 2020-09-27 – 2020-09-29 (×6): 2 g via INTRAVENOUS
  Filled 2020-09-27 (×9): qty 2

## 2020-09-27 MED ORDER — KETAMINE BOLUS VIA INFUSION
0.1000 mg/kg | Freq: Once | INTRAVENOUS | Status: AC
  Administered 2020-09-27: 4.87 mg via INTRAVENOUS
  Filled 2020-09-27: qty 5

## 2020-09-27 MED ORDER — VANCOMYCIN HCL 750 MG/150ML IV SOLN
750.0000 mg | Freq: Two times a day (BID) | INTRAVENOUS | Status: DC
  Administered 2020-09-27 – 2020-09-29 (×4): 750 mg via INTRAVENOUS
  Filled 2020-09-27 (×6): qty 150

## 2020-09-27 MED ORDER — GENTAMICIN IN SALINE 1-0.9 MG/ML-% IV SOLN
100.0000 mg | Freq: Once | INTRAVENOUS | Status: DC
  Filled 2020-09-27: qty 100

## 2020-09-27 MED ORDER — SODIUM CHLORIDE 0.9 % IV SOLN
0.5000 mg/kg/h | INTRAVENOUS | Status: DC
  Administered 2020-09-27 – 2020-09-28 (×3): 0.5 mg/kg/h via INTRAVENOUS
  Filled 2020-09-27 (×5): qty 5

## 2020-09-27 MED ORDER — GENTAMICIN SULFATE 40 MG/ML IJ SOLN
50.0000 mg | Freq: Two times a day (BID) | INTRAVENOUS | Status: DC
  Administered 2020-09-28 – 2020-09-29 (×3): 50 mg via INTRAVENOUS
  Filled 2020-09-27 (×4): qty 1.25

## 2020-09-27 MED ORDER — SODIUM CHLORIDE 0.9 % IV SOLN
300.0000 mg | Freq: Three times a day (TID) | INTRAVENOUS | Status: DC
  Administered 2020-09-27 – 2020-09-29 (×4): 300 mg via INTRAVENOUS
  Filled 2020-09-27 (×9): qty 300

## 2020-09-27 NOTE — Progress Notes (Signed)
Pharmacy Antibiotic Note  Andrea King is a 45 y.o. 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 Pharmacy has been consulted for vancomycin and gentamicin dosing. SCr today is stable at 0.69. A vancomycin trough came back this afternoon at 13.   Plan: Increase Vancomycin to 750 mg every 12 hours Goal AUC 400-550  Gent 100 mg X 1 then 50 mg every 12 hours Goal peak: 3-4 and trough < 1  Rifampin 300 mg IV Q 8 hours Cefepime 2 gm IV q 8 hours Monitor renal function closely    Height: 5' (152.4 cm) Weight: 48.7 kg (107 lb 5.8 oz) IBW/kg (Calculated) : 45.5  Temp (24hrs), Avg:97.7 F (36.5 C), Min:96.98 F (36.1 C), Max:98.42 F (36.9 C)  Recent Labs  Lab 09/24/20 0056 09/24/20 0433 09/24/20 1003 09/24/20 1600 09/24/20 2021 09/24/20 2243 09/25/20 0413 09/26/20 0525 09/27/20 0500 09/27/20 1330  WBC 13.6* 11.1*  --   --   --   --  22.8* 16.6* 19.8*  --   CREATININE 0.84 0.73  --   --   --   --  0.93 0.83 0.69  --   LATICACIDVEN 1.1 1.0 1.6 1.5 2.0* 2.2*  --   --   --   --   VANCOTROUGH  --   --   --   --   --   --   --   --   --  13*    Estimated Creatinine Clearance: 63.1 mL/min (by C-G formula based on SCr of 0.69 mg/dL).    No Known Allergies    Thank you for allowing pharmacy to be a part of this patient's care.  Sharin Mons, PharmD, BCPS, BCIDP Infectious Diseases Clinical Pharmacist Phone: [phone] 09/27/2020 2:45 PM

## 2020-09-27 NOTE — TOC Initial Note (Signed)
Transition of Care [facility]) - Initial/Assessment Note    Patient Details  Name: Andrea King MRN: [id number] Date of Birth: 1976/01/05  Transition of Care [facility]) CM/SW Contact:    Marina Goodell Phone Number: [phone] 09/27/2020, 2:51 PM  Clinical Narrative:                  Patient presents to [facility] due to vomiting and abdominal pain.  Patient has hx of active substance use.  Patient's main contacts are Chaney Malling [facility]) [phone] and Silverio Decamp (Aunt) [phone].  Patient remains in critical condition and intubated/sedated. Palliative Care NP spoke with Mr. Ernestine Mcmurray and Ms. Beaver about goals of care.  Patient remains FULL CODE.          Patient Goals and CMS Choice        Expected Discharge Plan and Services                                                Prior Living Arrangements/Services                       Activities of Daily Living      Permission Sought/Granted                  Emotional Assessment              Admission diagnosis:  Generalized abdominal pain [R10.84] Encephalopathy [G93.40] SBP (spontaneous bacterial peritonitis) (HCC) [K65.2] Fever [R50.9] Abdominal pain [R10.9] Altered mental status, unspecified altered mental status type [R41.82] Intractable vomiting with nausea, unspecified vomiting type [R11.2] Patient Active Problem List   Diagnosis Date Noted  . SBP (spontaneous bacterial peritonitis) (HCC) 09/23/2020  . Encephalopathy 09/23/2020  . Altered mental status   . Generalized abdominal pain   . Intractable vomiting   . Fever   . Cirrhosis of liver without ascites (HCC)   . Metabolic acidosis 09/19/2019  . Septic pulmonary embolism (HCC) 09/19/2019  . 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 AKI (acute kidney injury) (HCC) 09/18/2019  . Hypokalemia 09/18/2019  . Anemia in other chronic diseases classified elsewhere 09/18/2019  . Chronic hepatitis C without hepatic coma (HCC)  09/18/2019  . Protein-calorie malnutrition, severe 08/31/2019  . 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 Malnutrition of moderate degree 12/29/2017  . 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 Palliative care by specialist   . Goals of care, counseling/discussion   . Periorbital cellulitis 09/20/2017  . Hypoglycemia 09/20/2017  . Bacteremia due to Staphylococcus   . Sepsis (HCC) 11/13/2015  . Community acquired pneumonia 11/13/2015  . Pneumonia 11/13/2015  . Substance induced mood disorder (HCC) 11/13/2015  . Opiate withdrawal (HCC) 11/13/2015  . Opiate abuse, continuous (HCC) 11/13/2015  . Incarcerated femoral hernia left 04/28/2013   PCP:  Alberteen Spindle, CNM Pharmacy:   [facility] [place], [place] - [address] [place] [address] Phone: [phone] Fax: [phone]     Social Determinants of Health (SDOH) Interventions    Readmission Risk Interventions No flowsheet data found.

## 2020-09-27 NOTE — Progress Notes (Addendum)
    [facility]   Reason for visit: Follow up on TV endocarditis, encephalopathy  Interval History: LP done and minimal WBCs in CSF.    Physical Exam: Constitutional:  Vitals:   09/27/20 1114 09/27/20 1200  BP:  (!) 87/58  Pulse:  (!) 55  Resp:  (!) 24  Temp:  (!) 97.34 F (36.3 C)  SpO2: 96% 95%    Impression: TV endocarditis.  Previously with MSSA but no growth on blood cultures now.  Last positive blood culture was from last year.  CSF studies are not consistent with CNS infection.  I would anticipate significantly more WBCs with a CNS infection, even with the fact that she has been on treatment.  She also has a history of Serratia and with her IVDU history, I will broaden her antibiotics to vancomycin with cefepime.   Will stop acyclovir and ceftriaxone  Plan: 1.  Add cefepime and gentamicin 2. Stop acyclovir and ceftriaxone  Will also add rifampin in addition to above  Dr. Rivka Safer back on Monday, please call with any pertinent questions over the weekend

## 2020-09-27 NOTE — Progress Notes (Signed)
Patient taken to radiology for LP this shift without incident. Triglycerides on morning labs showed high so attempted to titrate off propofol infusion, but even with max dose versed, prn doses of dilaudid and versed, and ketamine infusion added patient still would have moments of severe agitation and distress (banging arms, crying, etc...) without the propofol infusion. Spoke with pharmacy who ordered a recheck of the triglycerides in the AM.

## 2020-09-27 NOTE — Plan of Care (Signed)
Pt became increasingly agitated attempting to self extubate at approx 2300, provider at bedside, sedation adjusted as appropriate and per providers order. Pt HR 48-80's provider aware, levo titrated as needed. RIJ triple lumen central line removed per order, hemostasis obtained, no complications. Family updated during shift.  Problem: Education: Goal: Knowledge of General Education information will improve Description: Including pain rating scale, medication(s)/side effects and non-pharmacologic comfort measures Outcome: Progressing   Problem: Health Behavior/Discharge Planning: Goal: Ability to manage health-related needs will improve Outcome: Progressing   Problem: Clinical Measurements: Goal: Ability to maintain clinical measurements within normal limits will improve Outcome: Progressing Goal: Will remain free from infection Outcome: Progressing Goal: Diagnostic test results will improve Outcome: Progressing Goal: Respiratory complications will improve Outcome: Progressing Goal: Cardiovascular complication will be avoided Outcome: Progressing   Problem: Activity: Goal: Risk for activity intolerance will decrease Outcome: Progressing   Problem: Nutrition: Goal: Adequate nutrition will be maintained Outcome: Progressing   Problem: Coping: Goal: Level of anxiety will decrease Outcome: Progressing   Problem: Elimination: Goal: Will not experience complications related to bowel motility Outcome: Progressing Goal: Will not experience complications related to urinary retention Outcome: Progressing   Problem: Pain Managment: Goal: General experience of comfort will improve Outcome: Progressing   Problem: Safety: Goal: Ability to remain free from injury will improve Outcome: Progressing   Problem: Skin Integrity: Goal: Risk for impaired skin integrity will decrease Outcome: Progressing

## 2020-09-27 NOTE — Progress Notes (Signed)
Nutrition Follow Up Note   DOCUMENTATION CODES:   Non-severe (moderate) malnutrition in context of chronic illness  INTERVENTION:   Vital 1.2 @ 22ml/hr- Increase by 73ml/hr q 12 hours until goal rate is reached.   Free water flushes 72ml q4 hours to maintain tube patency- Once IVF discontinued, recommend increase to q 6 hours   Regimen provides 1296kcal/day, 81g/day protein and 1089ml/day free water   Pt at high refeed risk; recommend monitor potassium, magnesium and phosphorus labs daily until stable  NUTRITION DIAGNOSIS:   Moderate Malnutrition related to chronic illness (cirrhosis, substance abuse) as evidenced by moderate fat depletion,severe muscle depletion.  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 She presented to ED after acute onset AMS with abdominal pain and was found to be in severe sepsis with high fevers up to 106 and was ultimately intubated and admitted to ICU.  Pt s/p TEE 5/26- pt noted to have bacteremia with evidence of tricuspid valve endocarditis Pt s/p lumbar puncture today  Pt sedated and ventilated. OGT in place. Pt NPO after midnight for lumbar puncture but is now tolerating tube feeds at 38ml/hr. Will plan to start advancing tube feeds to goal rate. Pt remains at high refeed risk.   No new weight since admit  Medications reviewed and include: valium, colace, lovenox, folic acid, solu-cortef, hydromorphone, reglan, MVI, oxycodone, protonix, miralax, Phos-nak, thiamine, NaCl @100ml /hr, levophed, vancomycin   Labs reviewed: Na 146(H), BUN 23(H), K 3.2(L), P 2.6 wnl, Mg 2.6(H) Wbc- 19.8(H), Hgb 8.5(L), Hct 25.4(L) cbgs- 139, 146, 117 x 24  hrs  Patient is currently intubated on ventilator support MV: 9.7 L/min Temp (24hrs), Avg:97.7 F (36.5 C), Min:96.98 F (36.1 C), Max:98.42 F (36.9 C)  Propofol: 2.107ml/hr- provides 77kcal/day   MAP- >75mmHg  UOP- 77m  Diet Order:    Diet Order            Diet NPO time specified  Diet effective midnight                EDUCATION NEEDS:   No education needs have been identified at this time  Skin:  Skin Assessment: Reviewed RN Assessment (ecchymosis)  Last BM:  pta  Height:   Ht Readings from Last 1 Encounters:  09/25/20 5' (1.524 m)    Weight:   Wt Readings from Last 1 Encounters:  09/23/20 48.7 kg    Ideal Body Weight:  45.45 kg  BMI:  Body mass index is 20.97 kg/m.  Estimated Nutritional Needs:   Kcal:  1277kcal/day  Protein:  75-85g/day  Fluid:  1.5-1.7L/day  09/25/20 MS, RD, LDN Please refer to [facility] for RD and/or RD on-call/weekend/after hours pager

## 2020-09-27 NOTE — Progress Notes (Addendum)
Addendum:  Given 0 cells in CSF and source for sepsis identified with endocarditis/bacteremia there is no concern for CNS infection at this time and no need for further CSF studies or for empiric CNS coverage. Pharmacy dosing abx for endocarditis.  Will continue to follow neuro exams.  Bing Neighbors, MD [facility] [phone]  If 7pm- 7am, please page neurology on call as listed in AMION.   **  Neurology Update  LP performed with normal basic studies including no pleiocytosis. Will discuss abx w/ ICU and cardiology tomorrow. There is no concern for CNS infection at this time with the possible exception of HSV. It does not appear HSV was drawn on CSF and it rarely presents without pleiocytosis esp after tx. I will call lab in AM to add on.   Bing Neighbors, MD [facility] [phone]  If 7pm- 7am, please page neurology on call as listed in AMION.

## 2020-09-27 NOTE — Progress Notes (Signed)
   Daily Progress Note   Patient Name: Andrea King       Date: 09/27/2020 DOB: Jan 10, 1976  Age: 45 y.o. MRN#: [id number] Attending Physician: Vida Rigger, MD Primary Care Physician: Alberteen Spindle, CNM Admit Date: 09/23/2020  Reason for Consultation/Follow-up: Establishing goals of care  Subjective:  Patient remains in critical condition. Intubated. LP procedure this morning. ID closely following and treating. No family at the bedside.   Uncle has been updated. He and patient's aunt, Lupita Leash continues to remain hopeful for some improvement and patient's survival. They understand she remains in critical condition. Discussed at length again recommendations for DNR in the setting of multiorgan failure and minimal chances of full recovery. Family verbalizes understanding however would like to continue with full code, full scope care with watchful waiting.    All questions answered. Family aware I will follow up next week for ongoing support. No weekend coverage.    Length of Stay: 4 days  Vital Signs: BP (!) 87/58   Pulse (!) 55   Temp (!) 97.34 F (36.3 C) (Rectal)   Resp (!) 24   Ht 5' (1.524 m)   Wt 48.7 kg   LMP  (LMP Unknown)   SpO2 95%   BMI 20.97 kg/m  SpO2: SpO2: 95 % O2 Device: O2 Device: Ventilator O2 Flow Rate: O2 Flow Rate (L/min): 2 L/min  Physical Exam: Obtunded, intubated, critically ill RRR Rhonchi No edema         Palliative Care Assessment & Plan  HPI: Palliative Care consult requested for goals of care discussion in this 45 y.o. 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  Patient presented to the ED via private vehicle with concerns for acute altered mental status, abdominal pain, and intractable nausea and  vomiting.  Head CT showed no acute abnormalities.  Abdominal CT showed questionable gallbladder wall thickening, hepatosplenomegaly, there is perihepatic and pelvic ascites with multiple pulmonary nodules.  UDS negative.  Since admission patient developed severe hypoxic respiratory failure requiring emergent intubation.  Code Status:  Full code  Goals of Care/Recommendations:  Ongoing discussions and support provided to family. Education provided on code status and prognosis. Family continues to request full scope/full code care as they are remaining hopeful. Watchful waiting.   PMT will continue to support and follow. No weekend coverage. Family knows I will follow up next week.   Prognosis: POOR   Thank you for allowing the Palliative Medicine Team to assist in the care of this patient.  Time Total: 40 min.   Visit consisted of counseling and education dealing with the complex and emotionally intense issues of symptom management and palliative care in the setting of serious and potentially life-threatening illness.Greater than 50%  of this time was spent counseling and coordinating care related to the above assessment and plan.  Willette Alma, AGPCNP-BC  Palliative Medicine Team [phone]

## 2020-09-27 NOTE — Progress Notes (Signed)
Pharmacy Antibiotic Note  Andrea King is a 45 y.o. female admitted on 09/23/2020. 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 Concern for possible infectious encephalitis and septic emboli. Pharmacy has been consulted for vancomycin and acyclovir dosing.  Plan: Vancomycin 1000 mg followed by 500 mg q12h for goal trough 15-20  5/27:   Vanc trough drawn @ 0100 was drawn in wrong tube,  vanc dose hung @ 0200 so will have to wait before next dose @ 1400 to redraw vanc trough.   Will draw another vanc trough on 5/27 @ 1300.   Acyclovir 10 mg/kg q8h  Ampicillin 2 g q4h  Increase ceftriaxone to 2 g q12h  Follow up cultures for ability to de-escalate as appropriate.  Height: 5' (152.4 cm) Weight: 48.7 kg (107 lb 5.8 oz) IBW/kg (Calculated) : 45.5  Temp (24hrs), Avg:97.8 F (36.6 C), Min:97.34 F (36.3 C), Max:98.42 F (36.9 C)  Recent Labs  Lab 09/23/20 1511 09/24/20 0056 09/24/20 0433 09/24/20 1003 09/24/20 1600 09/24/20 2021 09/24/20 2243 09/25/20 0413 09/26/20 0525  WBC 14.8* 13.6* 11.1*  --   --   --   --  22.8* 16.6*  CREATININE 0.82 0.84 0.73  --   --   --   --  0.93 0.83  LATICACIDVEN 2.0* 1.1 1.0 1.6 1.5 2.0* 2.2*  --   --     Estimated Creatinine Clearance: 60.8 mL/min (by C-G formula based on SCr of 0.83 mg/dL).    No Known Allergies  Antimicrobials this admission: Vanc 5/24 >> Ampicillin 5/24 >> Acyclovir 5/24 >> Ceftriaxone 5/23 >> Metronidazole 5/23 >> 5/24   Microbiology results: 5/23 BCx: NG 5/24 UCx: pending  5/23 MRSA PCR: negative  Thank you for allowing pharmacy to be a part of this patient's care.  Kadeshia Kasparian D, PharmD 09/27/2020 2:26 AM

## 2020-09-27 NOTE — Progress Notes (Signed)
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  Pregnancy test came back negative. given 1 L bolus of IV lactated Ringer, 4 mg IV Zofran twice, 1 g of IV Toradol and IV Zosyn  09/27/20- patient s/p LP today.  Continuing tx for menigntis with acyclovir and antibiotics.   Micro Data:  COVID NEG HIV NEG INF A/B NEG 4 years ago MRSA bacteremia 08/2019-1 year ago MSSA bacteremia and TRICUSPID VALVE ENDOCARDITIS 08/2018 SPINAL INFECTIONS  Antibiotics Given (last 72 hours)    Date/Time Action Medication Dose Rate   09/24/20 1346 New Bag/Given   vancomycin (VANCOREADY) IVPB 1000 mg/200 mL 1,000 mg 200 mL/hr   09/24/20 1506 New Bag/Given   ampicillin (OMNIPEN) 2 g in sodium chloride 0.9 % 100 mL IVPB 2 g 300 mL/hr   09/24/20 1548 New Bag/Given   acyclovir (ZOVIRAX) 485 mg in dextrose 5 % 100 mL IVPB 485 mg 109.7 mL/hr   09/24/20 1655 New Bag/Given   ampicillin (OMNIPEN) 2 g in sodium chloride 0.9 % 100 mL IVPB 2 g 300 mL/hr   09/24/20 2015 New Bag/Given   ampicillin (OMNIPEN) 2 g in sodium chloride 0.9 % 100 mL IVPB 2 g 300 mL/hr   09/24/20 2145 New Bag/Given   cefTRIAXone (ROCEPHIN) 2 g in sodium chloride 0.9 % 100 mL IVPB 2 g 200 mL/hr   09/24/20 2223 New Bag/Given   acyclovir (ZOVIRAX) 485 mg in dextrose 5 % 100 mL IVPB 485 mg 109.7 mL/hr   09/24/20 2342 New Bag/Given   ampicillin (OMNIPEN) 2 g in sodium chloride 0.9 % 100 mL IVPB 2 g 300 mL/hr   09/25/20 0109 New Bag/Given   vancomycin (VANCOREADY) IVPB 500 mg/100 mL 500 mg 100 mL/hr    09/25/20 0335 New Bag/Given   ampicillin (OMNIPEN) 2 g in sodium chloride 0.9 % 100 mL IVPB 2 g 300 mL/hr   09/25/20 0523 New Bag/Given   acyclovir (ZOVIRAX) 485 mg in dextrose 5 % 100 mL IVPB 485 mg 109.7 mL/hr   09/25/20 0739 New Bag/Given   ampicillin (OMNIPEN) 2 g in sodium chloride 0.9 % 100 mL IVPB 2 g 300 mL/hr   09/25/20 0924 New Bag/Given   cefTRIAXone (ROCEPHIN) 2 g in sodium chloride 0.9 % 100 mL IVPB 2 g 200 mL/hr   09/25/20 1243 New Bag/Given   ampicillin (OMNIPEN) 2 g in sodium chloride 0.9 % 100 mL IVPB 2 g 300 mL/hr   09/25/20 1330 New Bag/Given   vancomycin (VANCOREADY) IVPB 500 mg/100 mL 500 mg 100 mL/hr   09/25/20 1502 New Bag/Given   acyclovir (ZOVIRAX) 485 mg in dextrose 5 % 100 mL IVPB 485 mg 109.7 mL/hr   09/25/20 1702 New Bag/Given   ampicillin (OMNIPEN) 2 g in sodium chloride 0.9 % 100 mL IVPB 2 g 300 mL/hr   09/25/20 2055 New Bag/Given   ampicillin (OMNIPEN) 2 g in sodium chloride 0.9 % 100 mL IVPB 2 g 300 mL/hr   09/25/20 2256 New Bag/Given   cefTRIAXone (ROCEPHIN) 2 g in sodium chloride 0.9 % 100 mL IVPB 2 g 200 mL/hr  09/25/20 2330 New Bag/Given   acyclovir (ZOVIRAX) 485 mg in dextrose 5 % 100 mL IVPB 485 mg 109.7 mL/hr   09/26/20 0046 New Bag/Given   ampicillin (OMNIPEN) 2 g in sodium chloride 0.9 % 100 mL IVPB 2 g 300 mL/hr   09/26/20 0405 New Bag/Given   vancomycin (VANCOREADY) IVPB 500 mg/100 mL 500 mg 100 mL/hr   09/26/20 0533 New Bag/Given   ampicillin (OMNIPEN) 2 g in sodium chloride 0.9 % 100 mL IVPB 2 g 300 mL/hr   09/26/20 [id number] New Bag/Given   acyclovir (ZOVIRAX) 485 mg in dextrose 5 % 100 mL IVPB 485 mg 109.7 mL/hr   09/26/20 0806 New Bag/Given   ampicillin (OMNIPEN) 2 g in sodium chloride 0.9 % 100 mL IVPB 2 g 300 mL/hr   09/26/20 1050 New Bag/Given   cefTRIAXone (ROCEPHIN) 2 g in sodium chloride 0.9 % 100 mL IVPB 2 g 200 mL/hr   09/26/20 1242 New Bag/Given   ampicillin (OMNIPEN) 2 g in sodium chloride 0.9 % 100 mL IVPB 2 g 300 mL/hr    09/26/20 1358 New Bag/Given   acyclovir (ZOVIRAX) 485 mg in dextrose 5 % 100 mL IVPB 485 mg 109.7 mL/hr   09/26/20 1540 New Bag/Given   vancomycin (VANCOREADY) IVPB 500 mg/100 mL 500 mg 100 mL/hr   09/26/20 2147 New Bag/Given   cefTRIAXone (ROCEPHIN) 2 g in sodium chloride 0.9 % 100 mL IVPB 2 g 200 mL/hr   09/26/20 2301 New Bag/Given   acyclovir (ZOVIRAX) 485 mg in dextrose 5 % 100 mL IVPB 485 mg 109.7 mL/hr   09/27/20 0200 New Bag/Given   vancomycin (VANCOREADY) IVPB 500 mg/100 mL 500 mg 100 mL/hr   09/27/20 0626 New Bag/Given   acyclovir (ZOVIRAX) 485 mg in dextrose 5 % 100 mL IVPB 485 mg 109.7 mL/hr        Interim History / Subjective:  Remains intubated Severe septic shock     Objective   Blood pressure 123/72, pulse (!) 55, temperature (!) 97.52 F (36.4 C), temperature source Rectal, resp. rate (!) 24, height 5' (1.524 m), weight 48.7 kg, SpO2 95 %. CVP:  [0 mmHg-17 mmHg] 0 mmHg  Vent Mode: PRVC FiO2 (%):  [35 %-40 %] 35 % Set Rate:  [24 bmp] 24 bmp Vt Set:  [400 mL] 400 mL PEEP:  [5 cmH20] 5 cmH20 Plateau Pressure:  [18 cmH20-19 cmH20] 18 cmH20   Intake/Output Summary (Last 24 hours) at 09/27/2020 1035 Last data filed at 09/27/2020 0800 Gross per 24 hour  Intake 3411 ml  Output 1110 ml  Net 2301 ml   Filed Weights   09/23/20 0032 09/23/20 0845  Weight: 54.4 kg 48.7 kg      REVIEW OF SYSTEMS  PATIENT IS UNABLE TO PROVIDE COMPLETE REVIEW OF SYSTEMS DUE TO SEVERE CRITICAL ILLNESS AND TOXIC METABOLIC ENCEPHALOPATHY   PHYSICAL EXAMINATION:  GENERAL:critically ill appearing, +resp distress HEAD: Normocephalic, atraumatic.  EYES: Pupils equal, round, reactive to light.  No scleral icterus.  MOUTH: Moist mucosal membrane. NECK: Supple. PULMONARY: +rhonchi CARDIOVASCULAR: S1 and S2. Regular rate and rhythm. No murmurs, rubs, or gallops.  GASTROINTESTINAL: Soft, nontender, -distended. Positive bowel sounds.  MUSCULOSKELETAL: No swelling, clubbing, or edema.   NEUROLOGIC: obtunded SKIN:intact,warm,dry     ASSESSMENT AND PLAN SYNOPSIS   45 yo white female with multiple medical issues with severe sepsis with high fevers with metabolic acidosis with severe hypoxic respiratory failure with severe toxic metabolic encephalopathy leading to severe acute decompensation with very high risk  for cardiac arrest, patient emergently intubated, I ma concerned for septic emboli and or severe infections encephalitis     Severe ACUTE Hypoxic and Hypercapnic Respiratory Failure -continue Mechanical Ventilator support -continue Bronchodilator Therapy -Wean Fio2 and PEEP as tolerated -VAP/VENT bundle implementation  Acute ASTHMA EXACERBATION -continue IV steroids as prescribed -continue NEB THERAPY as prescribed -morphine as needed -wean fio2 as needed and tolerated         -ketamine for sedation due to 3 sedatives gtt with hypotension   CARDIAC FAILURE-h/o endocarditis Will need TEE -follow up cardiac enzymes as indicated -EF is normal   CARDIAC ICU monitoring    NEUROLOGY Acute toxic metabolic encephalopathy, need for sedation Goal RASS -2 to -3 Await NEURO recs CT spine and head no acute process      INFECTIOUS DISEASE h/o MSSA endocarditis -continue antibiotics as prescribed -follow up cultures Will need MRI brain, CT chest -continue antibiotics as prescribed -follow up cultures -follow up ID consultation Need to consider LP, NEUROLOGY CONSULTED Will empirically treat for infectious encephalitis   ENDO - ICU hypoglycemic\Hyperglycemia protocol -check FSBS per protocol   GI GI PROPHYLAXIS as indicated  NUTRITIONAL STATUS DIET-->TF's as tolerated Constipation protocol as indicated   ELECTROLYTES -follow labs as needed -replace as needed -pharmacy consultation and following    Best practice (right click and "Reselect all SmartList Selections" daily)  Diet:NPO Pain/Anxiety/Delirium protocol (if  indicated):Yes(RASS goal -2) VAP protocol (if indicated):Yes DVT prophylaxis:Contraindicated GI prophylaxis:H2B Glucose control:SSIYes Central venous access:Yes, and it is still needed Foley:N/A Mobility:B ED REST Code Status:FULL Disposition:ICU      Labs   CBC: Recent Labs  Lab 09/23/20 0744 09/23/20 1511 09/24/20 0056 09/24/20 0433 09/25/20 0413 09/26/20 0525 09/27/20 0500  WBC 16.7*   < > 13.6* 11.1* 22.8* 16.6* 19.8*  NEUTROABS 15.5*  --  12.3*  --  19.8* 13.6* 16.9*  HGB 9.1*   < > 8.8* 8.2* 8.4* 8.6* 8.5*  HCT 27.8*   < > 27.1* 24.7* 26.0* 25.8* 25.4*  MCV 79.0*   < > 80.4 78.4* 81.0 77.9* 78.6*  PLT 141*   < > 88* 76* 61* 66* 69*   < > = values in this interval not displayed.    Basic Metabolic Panel: Recent Labs  Lab 09/23/20 1511 09/24/20 0056 09/24/20 0433 09/25/20 0413 09/26/20 0525 09/27/20 0500  NA 136 131* 135 140 145 146*  K 3.4* 2.8* 3.5 3.9 3.4* 3.2*  CL 100 102 104 111 116* 118*  CO2 25 21* 22 19* 21* 23  GLUCOSE 135* 133* 127* 125* 153* 142*  BUN 16 17 16  27* 24* 23*  CREATININE 0.82 0.84 0.73 0.93 0.83 0.69  CALCIUM 7.8* 7.5* 7.6* 7.1* 7.8* 7.7*  MG 1.5* 1.4* 2.6* 2.6* 2.9* 2.6*  PHOS 3.1  --   --  4.0 2.4* 2.6   GFR: Estimated Creatinine Clearance: 63.1 mL/min (by C-G formula based on SCr of 0.69 mg/dL). Recent Labs  Lab 09/23/20 0744 09/23/20 1511 09/24/20 0433 09/24/20 1003 09/24/20 1600 09/24/20 2021 09/24/20 2243 09/25/20 0413 09/26/20 0525 09/27/20 0500  PROCALCITON 38.81  --   --   --   --   --   --  >150.00 >150.00  --   WBC 16.7*   < > 11.1*  --   --   --   --  22.8* 16.6* 19.8*  LATICACIDVEN 3.4*   < > 1.0 1.6 1.5 2.0* 2.2*  --   --   --    < > =  values in this interval not displayed.    Liver Function Tests: Recent Labs  Lab 09/24/20 0056 09/24/20 0433 09/25/20 0413 09/26/20 0525 09/27/20 0500  AST 19 16 71* 18 18  ALT 11 10 31 25 21   ALKPHOS 81 71 58 63 63  BILITOT 0.9 0.8 0.8 0.6 0.9   PROT 6.7 6.4* 5.7* 6.1* 5.8*  ALBUMIN 2.7* 2.8* 2.5* 2.5* 2.3*   Recent Labs  Lab 09/23/20 0043  LIPASE 25   Recent Labs  Lab 09/23/20 0302  AMMONIA 26    ABG    Component Value Date/Time   PHART 7.31 (L) 09/25/2020 0446   PCO2ART 36 09/25/2020 0446   PO2ART 111 (H) 09/25/2020 0446   HCO3 18.2 (L) 09/25/2020 0446   ACIDBASEDEF 7.6 (H) 09/25/2020 0446   O2SAT 98.1 09/25/2020 0446     Coagulation Profile: Recent Labs  Lab 09/23/20 0616 09/23/20 0744 09/24/20 1003  INR 2.5* 1.3* 1.4*    Cardiac Enzymes: Recent Labs  Lab 09/24/20 1003  CKTOTAL 10*    HbA1C: No results found for: HGBA1C  CBG: Recent Labs  Lab 09/26/20 1513 09/26/20 1929 09/26/20 2311 09/27/20 0411 09/27/20 0730  GLUCAP 141* 129* 125* 139* 146*    Allergies No Known Allergies     DVT/GI PRX  assessed I Assessed the need for Labs I Assessed the need for Foley I Assessed the need for Central Venous Line Family Discussion when available I Assessed the need for Mobilization I made an Assessment of medications to be adjusted accordingly Safety Risk assessment completed  CASE DISCUSSED IN MULTIDISCIPLINARY ROUNDS WITH ICU TEAM     Critical Care Time devoted to patient care services described in this note is 33  minutes.  Critical care was necessary to treat or prevent imminent or life-threatening deterioration. Overall, patient is critically ill, prognosis is guarded.  Patient with Multiorgan failure and at high risk for cardiac arrest and death.     09/29/20, M.D.  Pulmonary & Critical Care Medicine  [facility]

## 2020-09-27 NOTE — Procedures (Signed)
Lumbar Puncture Procedure Note  Andrea King  [id number]  01-26-1976  Date:09/27/20  Time:10:40 AM   Provider Performing:Estes Lehner Allena Katz   Procedure: Fluoroscopic guided lumbar puncture  Indication(s) AMS  Consent  Risks of the procedure as well as the alternatives and risks of each were explained to the patient and/or caregiver.  Consent for the procedure was obtained and is signed in the bedside chart  Anesthesia Topical only with 1% lidocaine    Time Out Verified patient identification, verified procedure, site/side was marked, verified correct patient position, special equipment/implants available, medications/allergies/relevant history reviewed, required imaging and test results available.   Sterile Technique Maximal sterile technique including sterile barrier drape, hand hygiene, sterile gloves, mask.    Procedure Description Using fluoroscopic guidance, appropriate location identified for needle placement.   Lidocaine used to anesthetize skin and subcutaneous tissue overlying this area.  A 20g spinal needle was then used to access the subarachnoid space. Opening pressure:Not obtained. 13 mL CSF obtained. Refer to full dictation in PACS.  Complications/Tolerance None; patient tolerated the procedure well.   EBL Minimal   Specimen(s) CSF

## 2020-09-28 LAB — CBC WITH DIFFERENTIAL/PLATELET
Abs Immature Granulocytes: 1.5 10*3/uL — ABNORMAL HIGH (ref 0.00–0.07)
Basophils Absolute: 0 10*3/uL (ref 0.0–0.1)
Basophils Relative: 0 %
Eosinophils Absolute: 0 10*3/uL (ref 0.0–0.5)
Eosinophils Relative: 0 %
HCT: 24 % — ABNORMAL LOW (ref 36.0–46.0)
Hemoglobin: 7.6 g/dL — ABNORMAL LOW (ref 12.0–15.0)
Immature Granulocytes: 11 %
Lymphocytes Relative: 11 %
Lymphs Abs: 1.6 10*3/uL (ref 0.7–4.0)
MCH: 26 pg (ref 26.0–34.0)
MCHC: 31.7 g/dL (ref 30.0–36.0)
MCV: 82.2 fL (ref 80.0–100.0)
Monocytes Absolute: 0.9 10*3/uL (ref 0.1–1.0)
Monocytes Relative: 6 %
Neutro Abs: 10.1 10*3/uL — ABNORMAL HIGH (ref 1.7–7.7)
Neutrophils Relative %: 72 %
Platelets: 65 10*3/uL — ABNORMAL LOW (ref 150–400)
RBC: 2.92 MIL/uL — ABNORMAL LOW (ref 3.87–5.11)
RDW: 15.9 % — ABNORMAL HIGH (ref 11.5–15.5)
Smear Review: NORMAL
WBC: 14.1 10*3/uL — ABNORMAL HIGH (ref 4.0–10.5)
nRBC: 0.3 % — ABNORMAL HIGH (ref 0.0–0.2)

## 2020-09-28 LAB — TRIGLYCERIDES: Triglycerides: 413 mg/dL — ABNORMAL HIGH (ref <150)

## 2020-09-28 LAB — COMPREHENSIVE METABOLIC PANEL
ALT: 19 U/L (ref 0–44)
AST: 20 U/L (ref 15–41)
Albumin: 2.2 g/dL — ABNORMAL LOW (ref 3.5–5.0)
Alkaline Phosphatase: 48 U/L (ref 38–126)
Anion gap: 5 (ref 5–15)
BUN: 24 mg/dL — ABNORMAL HIGH (ref 6–20)
CO2: 22 mmol/L (ref 22–32)
Calcium: 7.5 mg/dL — ABNORMAL LOW (ref 8.9–10.3)
Chloride: 121 mmol/L — ABNORMAL HIGH (ref 98–111)
Creatinine, Ser: 0.74 mg/dL (ref 0.44–1.00)
GFR, Estimated: >60 mL/min (ref >60)
Glucose, Bld: 139 mg/dL — ABNORMAL HIGH (ref 70–99)
Potassium: 3.5 mmol/L (ref 3.5–5.1)
Sodium: 148 mmol/L — ABNORMAL HIGH (ref 135–145)
Total Bilirubin: 0.9 mg/dL (ref 0.3–1.2)
Total Protein: 5.4 g/dL — ABNORMAL LOW (ref 6.5–8.1)

## 2020-09-28 LAB — GLUCOSE, CAPILLARY
Glucose-Capillary: 131 mg/dL — ABNORMAL HIGH (ref 70–99)
Glucose-Capillary: 134 mg/dL — ABNORMAL HIGH (ref 70–99)
Glucose-Capillary: 140 mg/dL — ABNORMAL HIGH (ref 70–99)
Glucose-Capillary: 143 mg/dL — ABNORMAL HIGH (ref 70–99)
Glucose-Capillary: 145 mg/dL — ABNORMAL HIGH (ref 70–99)
Glucose-Capillary: 145 mg/dL — ABNORMAL HIGH (ref 70–99)

## 2020-09-28 LAB — CULTURE, BLOOD (ROUTINE X 2)
Culture: NO GROWTH
Special Requests: ADEQUATE

## 2020-09-28 LAB — MAGNESIUM: Magnesium: 2.4 mg/dL (ref 1.7–2.4)

## 2020-09-28 LAB — PHOSPHORUS: Phosphorus: 3.8 mg/dL (ref 2.5–4.6)

## 2020-09-28 NOTE — Progress Notes (Signed)
Pharmacy Antibiotic Note  Andrea King is a 45 y.o. 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 Pharmacy has been consulted for vancomycin and gentamicin dosing.  SCr today is stable at 0.74.  vancomycin trough 5/27 at 1330=  13 mcg/ml.   Plan: -Continue Vancomycin to 750 mg every 12 hours Goal AUC 400-550   -Gent 100 mg X 1 then 50 mg every 12 hours Goal peak: 3-4 and trough < 1  Levels around 4th total dose on 5/29  -Rifampin 300 mg IV Q 8 hours -Cefepime 2 gm IV q 8 hours Monitor renal function closely    Height: 5' (152.4 cm) Weight: 48.7 kg (107 lb 5.8 oz) IBW/kg (Calculated) : 45.5  Temp (24hrs), Avg:97.8 F (36.6 C), Min:97.34 F (36.3 C), Max:98.6 F (37 C)  Recent Labs  Lab 09/24/20 0433 09/24/20 1003 09/24/20 1600 09/24/20 2021 09/24/20 2243 09/25/20 0413 09/26/20 0525 09/27/20 0500 09/27/20 1330 09/28/20 0439  WBC 11.1*  --   --   --   --  22.8* 16.6* 19.8*  --  14.1*  CREATININE 0.73  --   --   --   --  0.93 0.83 0.69  --  0.74  LATICACIDVEN 1.0 1.6 1.5 2.0* 2.2*  --   --   --   --   --   VANCOTROUGH  --   --   --   --   --   --   --   --  13*  --     Estimated Creatinine Clearance: 63.1 mL/min (by C-G formula based on SCr of 0.74 mg/dL).    No Known Allergies    Thank you for allowing pharmacy to be a part of this patient's care.  Bari Mantis PharmD Clinical Pharmacist 09/28/2020

## 2020-09-28 NOTE — Progress Notes (Signed)
Spoke with [facility] Transfer center, Dr. Sindy Guadeloupe with Cardiac Surgery declined transfer as the patient is not a candidate for surgical intervention.  Spoke with both the patient's aunt & uncle at length regarding this update. Discussed current plan of care, all questions & concerns answered at this time.   Cheryll Cockayne Rust-Chester, AGACNP-BC Acute Care Nurse Practitioner  Pulmonary & Critical Care   [phone] / [phone] Please see Amion for pager details.

## 2020-09-28 NOTE — Plan of Care (Signed)
Pt intermittently aggitated, requiring up titration of sedation. Pt attempting to self extubate during periods of extubation. Q2 oral care performed. Family updated with plan of care x 2. No significant events to note during shift.  Problem: Education: Goal: Knowledge of General Education information will improve Description: Including pain rating scale, medication(s)/side effects and non-pharmacologic comfort measures Outcome: Progressing   Problem: Health Behavior/Discharge Planning: Goal: Ability to manage health-related needs will improve Outcome: Progressing   Problem: Clinical Measurements: Goal: Ability to maintain clinical measurements within normal limits will improve Outcome: Progressing Goal: Will remain free from infection Outcome: Progressing Goal: Diagnostic test results will improve Outcome: Progressing Goal: Respiratory complications will improve Outcome: Progressing Goal: Cardiovascular complication will be avoided Outcome: Progressing   Problem: Activity: Goal: Risk for activity intolerance will decrease Outcome: Progressing   Problem: Nutrition: Goal: Adequate nutrition will be maintained Outcome: Progressing   Problem: Coping: Goal: Level of anxiety will decrease Outcome: Progressing   Problem: Elimination: Goal: Will not experience complications related to bowel motility Outcome: Progressing Goal: Will not experience complications related to urinary retention Outcome: Progressing   Problem: Pain Managment: Goal: General experience of comfort will improve Outcome: Progressing   Problem: Safety: Goal: Ability to remain free from injury will improve Outcome: Progressing   Problem: Skin Integrity: Goal: Risk for impaired skin integrity will decrease Outcome: Progressing

## 2020-09-29 ENCOUNTER — Inpatient Hospital Stay (HOSPITAL_COMMUNITY)
Admission: EM | Admit: 2020-09-29 | Discharge: 2020-10-12 | DRG: 871 | Discharge status: Against Medical Advice | Payer: Medicaid Other | Attending: Internal Medicine | Admitting: Internal Medicine

## 2020-09-29 ENCOUNTER — Encounter (HOSPITAL_COMMUNITY): Payer: Self-pay | Admitting: Emergency Medicine

## 2020-09-29 ENCOUNTER — Emergency Department (HOSPITAL_COMMUNITY): Payer: Medicaid Other

## 2020-09-29 ENCOUNTER — Other Ambulatory Visit: Payer: Self-pay

## 2020-09-29 DIAGNOSIS — K7460 Unspecified cirrhosis of liver: Secondary | ICD-10-CM | POA: Diagnosis present

## 2020-09-29 DIAGNOSIS — Z20822 Contact with and (suspected) exposure to covid-19: Secondary | ICD-10-CM | POA: Diagnosis present

## 2020-09-29 DIAGNOSIS — B370 Candidal stomatitis: Secondary | ICD-10-CM | POA: Diagnosis present

## 2020-09-29 DIAGNOSIS — J9601 Acute respiratory failure with hypoxia: Secondary | ICD-10-CM | POA: Diagnosis present

## 2020-09-29 DIAGNOSIS — I38 Endocarditis, valve unspecified: Secondary | ICD-10-CM | POA: Diagnosis present

## 2020-09-29 DIAGNOSIS — D638 Anemia in other chronic diseases classified elsewhere: Secondary | ICD-10-CM | POA: Diagnosis present

## 2020-09-29 DIAGNOSIS — Z833 Family history of diabetes mellitus: Secondary | ICD-10-CM

## 2020-09-29 DIAGNOSIS — Z8774 Personal history of (corrected) congenital malformations of heart and circulatory system: Secondary | ICD-10-CM

## 2020-09-29 DIAGNOSIS — Z79899 Other long term (current) drug therapy: Secondary | ICD-10-CM

## 2020-09-29 DIAGNOSIS — B3731 Acute candidiasis of vulva and vagina: Secondary | ICD-10-CM

## 2020-09-29 DIAGNOSIS — E43 Unspecified severe protein-calorie malnutrition: Secondary | ICD-10-CM | POA: Diagnosis present

## 2020-09-29 DIAGNOSIS — F1120 Opioid dependence, uncomplicated: Secondary | ICD-10-CM | POA: Diagnosis present

## 2020-09-29 DIAGNOSIS — T826XXA Infection and inflammatory reaction due to cardiac valve prosthesis, initial encounter: Secondary | ICD-10-CM | POA: Diagnosis present

## 2020-09-29 DIAGNOSIS — B373 Candidiasis of vulva and vagina: Secondary | ICD-10-CM | POA: Diagnosis present

## 2020-09-29 DIAGNOSIS — Y831 Surgical operation with implant of artificial internal device as the cause of abnormal reaction of the patient, or of later complication, without mention of misadventure at the time of the procedure: Secondary | ICD-10-CM | POA: Diagnosis present

## 2020-09-29 DIAGNOSIS — Z8619 Personal history of other infectious and parasitic diseases: Secondary | ICD-10-CM

## 2020-09-29 DIAGNOSIS — Z6825 Body mass index (BMI) 25.0-25.9, adult: Secondary | ICD-10-CM

## 2020-09-29 DIAGNOSIS — Z5329 Procedure and treatment not carried out because of patient's decision for other reasons: Secondary | ICD-10-CM | POA: Diagnosis present

## 2020-09-29 DIAGNOSIS — Z5900 Homelessness unspecified: Secondary | ICD-10-CM

## 2020-09-29 DIAGNOSIS — A419 Sepsis, unspecified organism: Principal | ICD-10-CM | POA: Diagnosis present

## 2020-09-29 DIAGNOSIS — B182 Chronic viral hepatitis C: Secondary | ICD-10-CM | POA: Diagnosis present

## 2020-09-29 DIAGNOSIS — E876 Hypokalemia: Secondary | ICD-10-CM | POA: Diagnosis present

## 2020-09-29 DIAGNOSIS — Z86711 Personal history of pulmonary embolism: Secondary | ICD-10-CM

## 2020-09-29 DIAGNOSIS — F1110 Opioid abuse, uncomplicated: Secondary | ICD-10-CM | POA: Diagnosis present

## 2020-09-29 DIAGNOSIS — I76 Septic arterial embolism: Secondary | ICD-10-CM | POA: Diagnosis present

## 2020-09-29 DIAGNOSIS — J189 Pneumonia, unspecified organism: Secondary | ICD-10-CM | POA: Diagnosis present

## 2020-09-29 DIAGNOSIS — I330 Acute and subacute infective endocarditis: Secondary | ICD-10-CM | POA: Diagnosis present

## 2020-09-29 DIAGNOSIS — Z981 Arthrodesis status: Secondary | ICD-10-CM

## 2020-09-29 DIAGNOSIS — Z8614 Personal history of Methicillin resistant Staphylococcus aureus infection: Secondary | ICD-10-CM

## 2020-09-29 DIAGNOSIS — F17210 Nicotine dependence, cigarettes, uncomplicated: Secondary | ICD-10-CM | POA: Diagnosis present

## 2020-09-29 HISTORY — DX: Endocarditis, valve unspecified: I38

## 2020-09-29 LAB — BASIC METABOLIC PANEL
Anion gap: 5 (ref 5–15)
BUN: 22 mg/dL — ABNORMAL HIGH (ref 6–20)
CO2: 21 mmol/L — ABNORMAL LOW (ref 22–32)
Calcium: 7.2 mg/dL — ABNORMAL LOW (ref 8.9–10.3)
Chloride: 120 mmol/L — ABNORMAL HIGH (ref 98–111)
Creatinine, Ser: 0.62 mg/dL (ref 0.44–1.00)
GFR, Estimated: >60 mL/min (ref >60)
Glucose, Bld: 147 mg/dL — ABNORMAL HIGH (ref 70–99)
Potassium: 3.3 mmol/L — ABNORMAL LOW (ref 3.5–5.1)
Sodium: 146 mmol/L — ABNORMAL HIGH (ref 135–145)

## 2020-09-29 LAB — CBC
HCT: 22 % — ABNORMAL LOW (ref 36.0–46.0)
Hemoglobin: 7 g/dL — ABNORMAL LOW (ref 12.0–15.0)
MCH: 26.1 pg (ref 26.0–34.0)
MCHC: 31.8 g/dL (ref 30.0–36.0)
MCV: 82.1 fL (ref 80.0–100.0)
Platelets: 119 K/uL — ABNORMAL LOW (ref 150–400)
RBC: 2.68 MIL/uL — ABNORMAL LOW (ref 3.87–5.11)
RDW: 16 % — ABNORMAL HIGH (ref 11.5–15.5)
WBC: 12.2 K/uL — ABNORMAL HIGH (ref 4.0–10.5)
nRBC: 0.5 % — ABNORMAL HIGH (ref 0.0–0.2)

## 2020-09-29 LAB — COMPREHENSIVE METABOLIC PANEL
ALT: 21 U/L (ref 0–44)
AST: 23 U/L (ref 15–41)
Albumin: 2.5 g/dL — ABNORMAL LOW (ref 3.5–5.0)
Alkaline Phosphatase: 48 U/L (ref 38–126)
Anion gap: 7 (ref 5–15)
BUN: 15 mg/dL (ref 6–20)
CO2: 21 mmol/L — ABNORMAL LOW (ref 22–32)
Calcium: 8 mg/dL — ABNORMAL LOW (ref 8.9–10.3)
Chloride: 116 mmol/L — ABNORMAL HIGH (ref 98–111)
Creatinine, Ser: 0.65 mg/dL (ref 0.44–1.00)
GFR, Estimated: >60 mL/min (ref >60)
Glucose, Bld: 95 mg/dL (ref 70–99)
Potassium: 2.4 mmol/L — CL (ref 3.5–5.1)
Sodium: 144 mmol/L (ref 135–145)
Total Bilirubin: 1 mg/dL (ref 0.3–1.2)
Total Protein: 6 g/dL — ABNORMAL LOW (ref 6.5–8.1)

## 2020-09-29 LAB — RESP PANEL BY RT-PCR (FLU A&B, COVID) ARPGX2
Influenza A by PCR: NEGATIVE
Influenza B by PCR: NEGATIVE
SARS Coronavirus 2 by RT PCR: NEGATIVE

## 2020-09-29 LAB — PHOSPHORUS: Phosphorus: 3.5 mg/dL (ref 2.5–4.6)

## 2020-09-29 LAB — GLUCOSE, CAPILLARY
Glucose-Capillary: 124 mg/dL — ABNORMAL HIGH (ref 70–99)
Glucose-Capillary: 137 mg/dL — ABNORMAL HIGH (ref 70–99)
Glucose-Capillary: 98 mg/dL (ref 70–99)

## 2020-09-29 LAB — TROPONIN I (HIGH SENSITIVITY): Troponin I (High Sensitivity): 10 ng/L (ref <18)

## 2020-09-29 LAB — CBC WITH DIFFERENTIAL/PLATELET
Abs Immature Granulocytes: 0 10*3/uL (ref 0.00–0.07)
Basophils Absolute: 0 10*3/uL (ref 0.0–0.1)
Basophils Relative: 0 %
Eosinophils Absolute: 0.2 10*3/uL (ref 0.0–0.5)
Eosinophils Relative: 1 %
HCT: 26.9 % — ABNORMAL LOW (ref 36.0–46.0)
Hemoglobin: 8.4 g/dL — ABNORMAL LOW (ref 12.0–15.0)
Lymphocytes Relative: 18 %
Lymphs Abs: 4.4 10*3/uL — ABNORMAL HIGH (ref 0.7–4.0)
MCH: 25.7 pg — ABNORMAL LOW (ref 26.0–34.0)
MCHC: 31.2 g/dL (ref 30.0–36.0)
MCV: 82.3 fL (ref 80.0–100.0)
Monocytes Absolute: 0.2 10*3/uL (ref 0.1–1.0)
Monocytes Relative: 1 %
Neutro Abs: 19.4 10*3/uL — ABNORMAL HIGH (ref 1.7–7.7)
Neutrophils Relative %: 80 %
Platelets: 206 10*3/uL (ref 150–400)
RBC: 3.27 MIL/uL — ABNORMAL LOW (ref 3.87–5.11)
RDW: 15.5 % (ref 11.5–15.5)
WBC: 24.3 10*3/uL — ABNORMAL HIGH (ref 4.0–10.5)
nRBC: 0 /100 WBC
nRBC: 0.2 % (ref 0.0–0.2)

## 2020-09-29 LAB — HEMOGLOBIN AND HEMATOCRIT, BLOOD
HCT: 22.9 % — ABNORMAL LOW (ref 36.0–46.0)
Hemoglobin: 7.1 g/dL — ABNORMAL LOW (ref 12.0–15.0)

## 2020-09-29 LAB — I-STAT BETA HCG BLOOD, ED (MC, WL, AP ONLY): I-stat hCG, quantitative: <5 m[IU]/mL (ref <5)

## 2020-09-29 LAB — GENTAMICIN LEVEL, PEAK: Gentamicin Pk: 2.2 ug/mL — ABNORMAL LOW (ref 5.0–10.0)

## 2020-09-29 LAB — ETHANOL: Alcohol, Ethyl (B): <10 mg/dL (ref <10)

## 2020-09-29 LAB — LACTIC ACID, PLASMA: Lactic Acid, Venous: 1.5 mmol/L (ref 0.5–1.9)

## 2020-09-29 LAB — MAGNESIUM: Magnesium: 2.3 mg/dL (ref 1.7–2.4)

## 2020-09-29 LAB — GENTAMICIN LEVEL, TROUGH: Gentamicin Trough: 0.8 ug/mL (ref 0.5–2.0)

## 2020-09-29 LAB — TRIGLYCERIDES: Triglycerides: 266 mg/dL — ABNORMAL HIGH (ref <150)

## 2020-09-29 LAB — SALICYLATE LEVEL: Salicylate Lvl: <7 mg/dL — ABNORMAL LOW (ref 7.0–30.0)

## 2020-09-29 LAB — LIPASE, BLOOD: Lipase: 44 U/L (ref 11–51)

## 2020-09-29 LAB — ACETAMINOPHEN LEVEL: Acetaminophen (Tylenol), Serum: <10 ug/mL — ABNORMAL LOW (ref 10–30)

## 2020-09-29 MED ORDER — SODIUM CHLORIDE 0.9 % IV SOLN
2.0000 g | Freq: Three times a day (TID) | INTRAVENOUS | Status: DC
  Administered 2020-09-29 – 2020-10-12 (×36): 2 g via INTRAVENOUS
  Filled 2020-09-29 (×38): qty 2

## 2020-09-29 MED ORDER — POTASSIUM CHLORIDE 20 MEQ PO PACK
40.0000 meq | PACK | Freq: Once | ORAL | Status: AC
  Administered 2020-09-29: 40 meq
  Filled 2020-09-29: qty 2

## 2020-09-29 MED ORDER — POTASSIUM CHLORIDE 10 MEQ/100ML IV SOLN: 10.0000 meq | Freq: Once | INTRAVENOUS | Status: DC

## 2020-09-29 MED ORDER — GENTAMICIN SULFATE 40 MG/ML IJ SOLN
60.0000 mg | Freq: Two times a day (BID) | INTRAVENOUS | Status: DC
  Filled 2020-09-29 (×2): qty 1.5

## 2020-09-29 MED ORDER — MORPHINE SULFATE (PF) 4 MG/ML IV SOLN
4.0000 mg | Freq: Once | INTRAVENOUS | Status: DC
Start: 2020-09-29 — End: 2020-09-30

## 2020-09-29 MED ORDER — MIDAZOLAM HCL 2 MG/2ML IJ SOLN
4.0000 mg | Freq: Once | INTRAMUSCULAR | Status: AC
  Administered 2020-09-29: 4 mg via INTRAVENOUS

## 2020-09-29 MED ORDER — ONDANSETRON HCL 4 MG/2ML IJ SOLN: 4.0000 mg | Freq: Once | INTRAMUSCULAR | Status: DC

## 2020-09-29 MED ORDER — GENTAMICIN IN SALINE 1.2-0.9 MG/ML-% IV SOLN
60.0000 mg | Freq: Two times a day (BID) | INTRAVENOUS | Status: DC
  Filled 2020-09-29: qty 50

## 2020-09-29 MED ORDER — GENTAMICIN IN SALINE 1.2-0.9 MG/ML-% IV SOLN
60.0000 mg | Freq: Two times a day (BID) | INTRAVENOUS | Status: DC
  Administered 2020-09-29 – 2020-10-04 (×10): 60 mg via INTRAVENOUS
  Filled 2020-09-29 (×9): qty 50

## 2020-09-29 MED ORDER — SODIUM CHLORIDE 0.9 % IV SOLN
300.0000 mg | Freq: Three times a day (TID) | INTRAVENOUS | Status: DC
  Administered 2020-09-30 (×2): 300 mg via INTRAVENOUS
  Filled 2020-09-29 (×3): qty 300

## 2020-09-29 MED ORDER — ACETAMINOPHEN 325 MG PO TABS
650.0000 mg | ORAL_TABLET | Freq: Four times a day (QID) | ORAL | Status: DC | PRN
  Administered 2020-10-02 – 2020-10-06 (×4): 650 mg via ORAL
  Filled 2020-09-29 (×4): qty 2

## 2020-09-29 MED ORDER — ONDANSETRON 4 MG PO TBDP
4.0000 mg | ORAL_TABLET | Freq: Once | ORAL | Status: AC
  Administered 2020-09-29: 4 mg via ORAL
  Filled 2020-09-29: qty 1

## 2020-09-29 MED ORDER — ACETAMINOPHEN 650 MG RE SUPP: 650.0000 mg | Freq: Four times a day (QID) | RECTAL | Status: DC | PRN

## 2020-09-29 MED ORDER — GENTAMICIN IN SALINE 1.2-0.9 MG/ML-% IV SOLN
60.0000 mg | Freq: Two times a day (BID) | INTRAVENOUS | Status: DC
  Filled 2020-09-29 (×2): qty 50

## 2020-09-29 MED ORDER — POTASSIUM CHLORIDE IN NACL 40-0.9 MEQ/L-% IV SOLN
INTRAVENOUS | Status: AC
  Filled 2020-09-29: qty 1000

## 2020-09-29 MED ORDER — ORAL CARE MOUTH RINSE: 15.0000 mL | Freq: Two times a day (BID) | OROMUCOSAL | Status: DC

## 2020-09-29 MED ORDER — SODIUM CHLORIDE 0.9 % IV SOLN
300.0000 mg | Freq: Three times a day (TID) | INTRAVENOUS | Status: DC
  Filled 2020-09-29 (×3): qty 300

## 2020-09-29 MED ORDER — POTASSIUM CHLORIDE CRYS ER 20 MEQ PO TBCR
40.0000 meq | EXTENDED_RELEASE_TABLET | Freq: Once | ORAL | Status: AC
  Administered 2020-09-30: 40 meq via ORAL
  Filled 2020-09-29: qty 2

## 2020-09-29 MED ORDER — VANCOMYCIN HCL 750 MG/150ML IV SOLN
750.0000 mg | Freq: Two times a day (BID) | INTRAVENOUS | Status: DC
  Filled 2020-09-29: qty 150

## 2020-09-29 MED ORDER — VANCOMYCIN HCL 750 MG/150ML IV SOLN
750.0000 mg | Freq: Two times a day (BID) | INTRAVENOUS | Status: DC
  Administered 2020-09-29 – 2020-09-30 (×3): 750 mg via INTRAVENOUS
  Filled 2020-09-29 (×4): qty 150

## 2020-09-29 MED ORDER — LACTATED RINGERS IV BOLUS (SEPSIS): 1000.0000 mL | Freq: Once | INTRAVENOUS | Status: DC

## 2020-09-29 NOTE — Progress Notes (Signed)
No acute events overnight, did notice that patient will become agitated quickly if any of her sedation or pain medication drips run out. She will not follow commands during this time, but attempts to remove ET tube. Mitts in place. Will continue to monitor

## 2020-09-29 NOTE — Progress Notes (Signed)
Patient became angry and states that she does not want to be here, demanding something to drink, attempting to get out of bed, states her aunt Jennette Kettle is coming to get her. Patient began yelling at this RN when attempting to take her lines out, stating "do not touch me bitch".   Patient cussing at this RN yelling to leave her alone.  Patient signed AMA form, form placed in patients chart.   Dr. Karna Christmas and charge RN, Katie aware.  Security called as patient continues to yell and cuss at staff.  Renee, NT assisted patient to get dressed and remove patients foley. All patients belongings were returned including clothes & her rings.   Renee and security team escorted patient downstairs to main entrance where patient states her aunt will pick her up.

## 2020-09-29 NOTE — Progress Notes (Signed)
Pharmacy Antibiotic Note  Andrea King is a 45 y.o. 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 Pharmacy has been consulted for vancomycin and gentamicin dosing.   SCr today is stable at 0.62.  vancomycin trough 5/27 at 1330=  13 mcg/ml. Vanc increased from 500mg  q12h  to 750mg  q12h  Plan: -Continue Vancomycin to 750 mg every 12 hours Goal AUC 400-550  F/u for recheck of Vanc levels  -Gentamicin 100 mg X 1, then 50 mg every 12 hours Goal peak: 3-4 and trough < 1  Levels around 4th total dose on 5/29 5/29 Gent Trough @0533 = 0.8 mcg/ml 5/29 Gent Peak@0822 = 2.2 mcg/ml  Will adjust Gentamicin dose to 60 mg every 12 hrs for goal peak 3-4 mcg/ml  -Rifampin 300 mg IV Q 8 hours -Cefepime 2 gm IV q 8 hours Monitor renal function closely    Height: 5' (152.4 cm) Weight: 48.7 kg (107 lb 5.8 oz) IBW/kg (Calculated) : 45.5  Temp (24hrs), Avg:98.4 F (36.9 C), Min:97.7 F (36.5 C), Max:99.32 F (37.4 C)  Recent Labs  Lab 09/24/20 0433 09/24/20 1003 09/24/20 1600 09/24/20 2021 09/24/20 2243 09/25/20 0413 09/26/20 0525 09/27/20 0500 09/27/20 1330 09/28/20 0439 09/29/20 0533 09/29/20 0822  WBC 11.1*  --   --   --   --  22.8* 16.6* 19.8*  --  14.1* 12.2*  --   CREATININE 0.73  --   --   --   --  0.93 0.83 0.69  --  0.74 0.62  --   LATICACIDVEN 1.0 1.6 1.5 2.0* 2.2*  --   --   --   --   --   --   --   VANCOTROUGH  --   --   --   --   --   --   --   --  13*  --   --   --   GENTTROUGH  --   --   --   --   --   --   --   --   --   --  0.8  --   GENTPEAK  --   --   --   --   --   --   --   --   --   --   --  2.2*    Estimated Creatinine Clearance: 63.1 mL/min (by C-G formula based on SCr of 0.62 mg/dL).    No Known Allergies    Thank you for allowing pharmacy to be a  part of this patient's care.  09/30/20 PharmD Clinical Pharmacist 09/29/2020

## 2020-09-29 NOTE — Progress Notes (Signed)
Pharmacy Antibiotic Note  Andrea King is a 45 y.o. female admitted on 09/29/2020 with tricuspid valve endocarditis. 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 Pharmacy has been consulted for vancomycin, cefepime and gentamicin dosing. Of note, patient left AMA earlier today.   SCr today is stable at 0.65  Plan: -Resume Vancomycin 750 mg every 12 hours Goal AUC 400-550  F/u for recheck of Vanc levels  -Resume Gentamicin 60 mg every 12 hours Goal peak: 3-4 and trough < 1  Levels around 4th total dose on 5/29 5/29 Gent Trough @0533 = 0.8 mcg/ml 5/29 Gent Peak@0822 = 2.2 mcg/ml  Will adjust Gentamicin dose to 60 mg every 12 hrs for goal peak 3-4 mcg/ml  -Rifampin 300 mg IV Q 8 hours -Cefepime 2 gm IV q 8 hours -Monitor renal function closely       Temp (24hrs), Avg:99 F (37.2 C), Min:98.42 F (36.9 C), Max:99.9 F (37.7 C)  Recent Labs  Lab 09/24/20 1003 09/24/20 1600 09/24/20 2021 09/24/20 2243 09/25/20 0413 09/26/20 0525 09/27/20 0500 09/27/20 1330 09/28/20 0439 09/29/20 0533 09/29/20 0822 09/29/20 1842 09/29/20 1843  WBC  --   --   --   --    < > 16.6* 19.8*  --  14.1* 12.2*  --   --  24.3*  CREATININE  --   --   --   --    < > 0.83 0.69  --  0.74 0.62  --   --  0.65  LATICACIDVEN 1.6 1.5 2.0* 2.2*  --   --   --   --   --   --   --  1.5  --   VANCOTROUGH  --   --   --   --   --   --   --  13*  --   --   --   --   --   GENTTROUGH  --   --   --   --   --   --   --   --   --  0.8  --   --   --   GENTPEAK  --   --   --   --   --   --   --   --   --   --  2.2*  --   --    < > = values in this interval not displayed.    Estimated Creatinine Clearance: 63.1 mL/min (by C-G formula based on SCr of 0.65 mg/dL).    No Known Allergies    Thank you for allowing pharmacy to be a part of this patient's care.  10/01/20, PharmD., BCPS, BCCCP Clinical Pharmacist Please refer to [facility] for unit-specific pharmacist

## 2020-09-29 NOTE — ED Provider Notes (Signed)
Emergency Medicine Provider Triage Evaluation Note  Andrea King , a 45 y.o. female  was evaluated in triage.  Pt complains of chest pain.  Patient is a difficult historian, not providing much information.  States that she was at [facility].  Per chart review, she was admitted with encephalopathy on 5/23, left AMA today.  Currently being treated for endocarditis.  She reports chest pain comes and goes.  Is unable to provide much of the history.  Also endorses nausea and vomiting.  Review of Systems  Positive: As above Negative: As above  Physical Exam  BP 130/71 (BP Location: Left Arm)   Pulse 99   Temp 99.9 F (37.7 C)   Resp (!) 22   LMP  (LMP Unknown)   SpO2 97%  Gen:   Awake, no distress   Resp:  Normal effort  MSK:   Moves extremities without difficulty  Other:    Medical Decision Making  Medically screening exam initiated at 5:54 PM.  Appropriate orders placed.  Andrea King was informed that the remainder of the evaluation will be completed by another provider, this initial triage assessment does not replace that evaluation, and the importance of remaining in the ED until their evaluation is complete.  Nursing staff report to expedite triage   Mare Ferrari, PA-C 09/29/20 1755    Terrilee Files, MD 09/30/20 [phone]

## 2020-09-29 NOTE — ED Provider Notes (Signed)
[facility] EMERGENCY DEPARTMENT Provider Note   CSN: [id number] Arrival date & time: 09/29/20  1734     History Chief Complaint  Patient presents with  . Chest Pain    Andrea King is a 45 y.o. female.  HPI 45 year old female with history of tricuspid valve replacement, endocarditis, hepatitis C, cirrhosis, epidural abscess, and chronic substance abuse presents the emergency department for chest pain and endocarditis.  States she left AMA from an out side hospital after she was extubated this morning.  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  States she came back as she was having intermittent chest pain and still did not feel well, decided that she really needed medical treatment.  Does endorse severe, sharp pain that moves all over her chest.  Is episodic in nature coming for a second or 2 every few minutes.  Nothing makes it better, nothing makes it worse.  Similar to previous episodes of endocarditis.  Also feels like she is having trouble taking deep breaths.  Has been febrile throughout the course, T-max of 106 in the hospital per uncle.     Past Medical History:  Diagnosis Date  . Anemia   . Asthma   . DJD (degenerative joint disease)   . Endocarditis   . Hepatitis C   . Substance abuse [facility])     Patient Active Problem List   Diagnosis Date Noted  . Endocarditis of prosthetic tricuspid valve (HCC) 09/29/2020  . SBP (spontaneous bacterial peritonitis) (HCC) 09/23/2020  . Encephalopathy 09/23/2020  . Altered mental status   . Generalized abdominal pain   . Intractable vomiting   . Fever   . Cirrhosis of liver without ascites (HCC)   . Metabolic acidosis 09/19/2019  . Septic pulmonary embolism (HCC) 09/19/2019  . 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 AKI (acute kidney injury) (HCC) 09/18/2019  . Hypokalemia 09/18/2019  . Anemia in other chronic diseases classified elsewhere 09/18/2019  .  Chronic hepatitis C without hepatic coma (HCC) 09/18/2019  . Protein-calorie malnutrition, severe 08/31/2019  . 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 Malnutrition of moderate degree 12/29/2017  . 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 Palliative care by specialist   . Goals of care, counseling/discussion   . Periorbital cellulitis 09/20/2017  . Hypoglycemia 09/20/2017  . Bacteremia due to Staphylococcus   . Sepsis (HCC) 11/13/2015  . Community acquired pneumonia 11/13/2015  . Pneumonia 11/13/2015  . Substance induced mood disorder (HCC) 11/13/2015  . Opiate withdrawal (HCC) 11/13/2015  . Opiate abuse, continuous (HCC) 11/13/2015  . Incarcerated femoral hernia left 04/28/2013    Past Surgical History:  Procedure Laterality Date  . INGUINAL HERNIA REPAIR Left 04/28/2013   Procedure: HERNIA REPAIR INGUINAL ADULT with mesh;  Surgeon: Cherylynn Ridges, MD;  Location: [facility] OR;  Service: General;  Laterality: Left;  . TEE WITHOUT CARDIOVERSION N/A 11/18/2015   Procedure: TRANSESOPHAGEAL ECHOCARDIOGRAM (TEE);  Surgeon: Iran Ouch, MD;  Location: [facility] ORS;  Service: Cardiovascular;  Laterality: N/A;  . TEE WITHOUT CARDIOVERSION N/A 12/31/2017   Procedure: TRANSESOPHAGEAL ECHOCARDIOGRAM (TEE);  Surgeon: Dalia Heading, MD;  Location: [facility] ORS;  Service: Cardiovascular;  Laterality: N/A;  . TEE WITHOUT CARDIOVERSION N/A 01/04/2018   Procedure: TRANSESOPHAGEAL ECHOCARDIOGRAM (TEE);  Surgeon: Dalia Heading, MD;  Location: [facility] ORS;  Service: Cardiovascular;  Laterality: N/A;  . tricuspid valve replacement    . TUMOR EXCISION     left scalpula     OB  History   No obstetric history on file.     Family History  Problem Relation Age of Onset  . Cancer Mother   . Cancer - Colon Maternal Grandfather   . Diabetes Maternal Grandfather     Social History   Tobacco Use  . Smoking status: Current Every Day Smoker    Types: Cigarettes  . Smokeless tobacco: Never Used  Substance Use Topics  .  Alcohol use: No    Alcohol/week: 0.0 standard drinks  . Drug use: Yes    Comment: cocaine,Heroine    Home Medications Prior to Admission medications   Medication Sig Start Date End Date Taking? Authorizing Provider  amoxicillin-clavulanate (AUGMENTIN) 875-125 MG tablet Take 1 tablet by mouth 2 (two) times daily. Patient not taking: Reported on 09/23/2020    [provider]  aspirin EC 81 MG tablet Take 81 mg by mouth daily. Patient not taking: Reported on 09/23/2020    [provider]  Buprenorphine HCl-Naloxone HCl 8-2 MG FILM Place under the tongue. Patient not taking: Reported on 09/23/2020    [provider]  doxycycline (VIBRAMYCIN) 100 MG capsule Take 100 mg by mouth 2 (two) times daily. Patient not taking: Reported on 09/23/2020    [provider]  gabapentin (NEURONTIN) 300 MG capsule Take 300 mg by mouth 3 (three) times daily. Take 2 caps in the AM, midday, and 3 caps at night Patient not taking: Reported on 09/23/2020    [provider]  hydrOXYzine (VISTARIL) 25 MG capsule Take 25 mg by mouth 4 (four) times daily. Patient not taking: Reported on 09/23/2020    [provider]    Allergies    Patient has no known allergies.  Review of Systems   Review of Systems  Constitutional: Positive for chills, fatigue and fever.  HENT: Negative for ear pain and sore throat.   Eyes: Negative for pain and visual disturbance.  Respiratory: Positive for shortness of breath. Negative for cough.   Cardiovascular: Positive for chest pain and leg swelling. Negative for palpitations.  Gastrointestinal: Positive for nausea and vomiting. Negative for abdominal pain.  Genitourinary: Negative for dysuria and hematuria.  Musculoskeletal: Negative for arthralgias.  Skin: Negative for color change and rash.  Neurological: Negative for seizures and syncope.  Psychiatric/Behavioral: Negative for self-injury.  All other systems reviewed and are  negative.   Physical Exam Updated Vital Signs BP 133/71   Pulse (!) 119   Temp 99.9 F (37.7 C)   Resp 12   LMP  (LMP Unknown)   SpO2 92%   Physical Exam Vitals and nursing note reviewed.  Constitutional:      General: She is not in acute distress. HENT:     Head: Normocephalic and atraumatic.  Eyes:     Extraocular Movements: Extraocular movements intact.     Conjunctiva/sclera: Conjunctivae normal.  Cardiovascular:     Rate and Rhythm: Regular rhythm. Tachycardia present.     Heart sounds: Murmur heard.    Pulmonary:     Effort: Pulmonary effort is normal. Tachypnea present. No respiratory distress.     Breath sounds: Normal breath sounds.  Chest:     Chest wall: No tenderness.  Abdominal:     General: Bowel sounds are normal.     Palpations: Abdomen is soft.     Tenderness: There is no abdominal tenderness.  Musculoskeletal:        General: Normal range of motion.     Cervical back: Normal range of motion and  neck supple.     Right lower leg: No tenderness. Edema present.     Left lower leg: No tenderness. Edema present.  Skin:    General: Skin is warm and dry.  Neurological:     General: No focal deficit present.     Mental Status: She is alert and oriented to person, place, and time.  Psychiatric:        Mood and Affect: Mood normal.     ED Results / Procedures / Treatments   Labs (all labs ordered are listed, but only abnormal results are displayed) Labs Reviewed  CBC WITH DIFFERENTIAL/PLATELET - Abnormal; Notable for the following components:      Result Value   WBC 24.3 (*)    RBC 3.27 (*)    Hemoglobin 8.4 (*)    HCT 26.9 (*)    MCH 25.7 (*)    Neutro Abs 19.4 (*)    Lymphs Abs 4.4 (*)    All other components within normal limits  COMPREHENSIVE METABOLIC PANEL - Abnormal; Notable for the following components:   Potassium 2.4 (*)    Chloride 116 (*)    CO2 21 (*)    Calcium 8.0 (*)    Total Protein 6.0 (*)    Albumin 2.5 (*)    All other  components within normal limits  CULTURE, BLOOD (ROUTINE X 2)  CULTURE, BLOOD (ROUTINE X 2)  RESP PANEL BY RT-PCR (FLU A&B, COVID) ARPGX2  LIPASE, BLOOD  LACTIC ACID, PLASMA  LACTIC ACID, PLASMA  RAPID URINE DRUG SCREEN, HOSP PERFORMED  URINALYSIS, ROUTINE W REFLEX MICROSCOPIC  ETHANOL  SALICYLATE LEVEL  ACETAMINOPHEN LEVEL  CBC WITH DIFFERENTIAL/PLATELET  BASIC METABOLIC PANEL  I-STAT BETA HCG BLOOD, ED ([facility], [facility], [facility] ONLY)  TROPONIN I (HIGH SENSITIVITY)  TROPONIN I (HIGH SENSITIVITY)    EKG EKG Interpretation  Date/Time:  Sunday Sep 29 2020 17:54:48 EDT Ventricular Rate:  103 PR Interval:  112 QRS Duration: 110 QT Interval:  360 QTC Calculation: 471 R Axis:   70 Text Interpretation: Sinus tachycardia Incomplete right bundle branch block T wave abnormality, consider anterior ischemia No significant change since last tracing Confirmed by Gwyneth Sprout ([id number]) on 09/29/2020 7:45:02 PM   Radiology DG Chest Port 1 View  Result Date: 09/29/2020 CLINICAL DATA:  Possible sepsis EXAM: PORTABLE CHEST 1 VIEW COMPARISON:  09/26/2020, CT 11/13/2015 FINDINGS: Extensive surgical hardware in the cervicothoracic spine. Post sternotomy changes and valve prosthesis. Interval extubation and removal of esophageal tubes. Trace left-sided pleural effusion, decreased compared to prior. Improved aeration bilaterally with clearing of previously noted airspace disease. Stable cardiomediastinal silhouette. No pneumothorax IMPRESSION: Improved aeration bilaterally with resolution of previously noted airspace opacities. Small left-sided pleural effusion, decreased compared to prior Electronically Signed   By: Jasmine Pang M.D.   On: 09/29/2020 20:33    Procedures Procedures   Medications Ordered in ED Medications  ondansetron (ZOFRAN) injection 4 mg (has no administration in time range)  morphine 4 MG/ML injection 4 mg (has no administration in time range)  potassium chloride SA (KLOR-CON) CR  tablet 40 mEq (has no administration in time range)  ceFEPIme (MAXIPIME) 2 g in sodium chloride 0.9 % 100 mL IVPB (has no administration in time range)  vancomycin (VANCOREADY) IVPB 750 mg/150 mL (has no administration in time range)  gentamicin (GARAMYCIN) IVPB 60 mg (has no administration in time range)  rifampin (RIFADIN) 300 mg in sodium chloride 0.9 % 100 mL IVPB (has no administration in time range)  acetaminophen (  TYLENOL) tablet 650 mg (has no administration in time range)    Or  acetaminophen (TYLENOL) suppository 650 mg (has no administration in time range)  0.9 % NaCl with KCl 40 mEq / L  infusion (has no administration in time range)  ondansetron (ZOFRAN-ODT) disintegrating tablet 4 mg (4 mg Oral Given 09/29/20 2045)    ED Course  I have reviewed the triage vital signs and the nursing notes.  Pertinent labs & imaging results that were available during my care of the patient were reviewed by me and considered in my medical decision making (see chart for details).    MDM Rules/Calculators/A&P                          45 year old female presents the emergency department for further treatment of her endocarditis after leaving AMA from previous facility.  Blood pressure is stable, but she is tachycardic and tachypneic upon my evaluation.  Is mentating well at this time, uncle confirms she is at her baseline.  Exam does have heart murmur, she says this is chronic.  Abdomen is soft.  Does have peripheral edema in bilateral lower extremities.  Sepsis order set initiated, I do have concerns for sepsis due to tachycardia and tachypnea.  Has not been hypoxic here.  Concerning source is endocarditis.  TTE at outside hospital showed vegetation on tricuspid valve.  I did review medical records from recent admit, did get lumbar puncture that showed no concerns for meningitis.  Will discuss with pharmacy and restart antibiotics.  Looks like she was on vancomycin, cefepime, gentamicin, and  rifampin per infectious disease note.  Lactate 1.5, given 1 L fluid bolus.  She does have some peripheral edema and looks to have been very fluid resuscitated at outside facility, will hold on further fluid resuscitation.  Additionally, patient endorsing severe pain and nausea.  Ordered morphine and Zofran for pain.  Pharmacy ordered cefepime, gentamicin, rifampin, and vancomycin.  Labs significant for leukocytosis of 24.3.  Anemia with hemoglobin of 8.4.  Hypokalemia with potassium of 2.4.  Metabolic without lactic acidosis.  Troponin is not elevated.  Pregnancy is negative.  EKG with sinus tachycardia, no STEMI or other acute ischemia.  Chest x-ray with no acute findings such as pneumonia or pneumothorax.  Potassium replacement ordered.  Patient is amenable to admission for endocarditis which is clearly indicated.  As she is mentating normally, hemodynamically stable-I do believe she stable for a floor bed at this time.  Patient admitted to the hospitalist service for close monitoring and treatment of endocarditis.  Final Clinical Impression(s) / ED Diagnoses Final diagnoses:  Sepsis, due to unspecified organism, unspecified whether acute organ dysfunction present [facility](HCC)  Acute infective endocarditis, due to unspecified organism    Rx / DC Orders ED Discharge Orders    None       Louretta ParmaStewardson, Deloros Beretta, DO 09/29/20 2110    Gwyneth SproutPlunkett, Whitney, MD 09/30/20 [phone]

## 2020-09-29 NOTE — Progress Notes (Signed)
Patient extubated to 2L [place] without complications.  Oxygen saturations remain in high 90's. Patient able to follow commands and oriented to self, place and time.

## 2020-09-29 NOTE — ED Notes (Signed)
ED Provider at bedside. 

## 2020-09-29 NOTE — Progress Notes (Signed)
50cc Dilaudid & 70cc Ketamine infusions wasted in [place] cycle, witnessed by Cheral Bay, Consulting civil engineer.

## 2020-09-29 NOTE — H&P (Signed)
History and Physical    Andrea King HWE:[id number] DOB: 1975/07/12 DOA: 09/29/2020  PCP: Alberteen Spindle, CNM   Patient coming from: Home.   I have personally briefly reviewed patient's old medical records in [facility] Link  Chief Complaint: Chest pain and fever.  HPI: Andrea King is a 45 y.o. female with medical history significant of anemia, asthma, DJD, hepatitis C, liver cirrhosis, IVDA substance abuse who was admitted [facility] on 09/23/2020 due to a spontaneous bacterial peritonitis and MSSA bacteremia with tricuspid valve endocarditis, septic pulmonary emboli as well as discitis and osteomyelitis after presenting to the ER with altered mental status, confusion and abdominal pain.  On 09/24/2020 the patient had respiratory failure and had to be intubated.  While intubated, the care team [facility] tried to transfer her to [facility] cardiac surgery service, but they declined the transfer stating that she was not a surgical candidate.  She was extubated on 09/29/2020 and later once she was more alert and oriented decided to signed AMA.  She stated that after she was given a lecture by her family members she was subsequently brought here by dans to resume therapy.  When asked about her review of systems patient stated that she had been on induced coma and did not know about her symptoms.  She acted surprised to know that she has liver cirrhosis.  ED Course: Initial vital signs were temperature 99.9 F, pulse 99, respirations 22, BP 130/71 mmHg O2 sat 97% on room air.  The patient was restarted on cefepime, vancomycin, gentamicin and rifampin.  She also received IV fluids, 4 mg of morphine and potassium chloride 40 mEq x 1.  Lab work: UDS was positive for opiates and benzodiazepines.  Urinalysis had moderate hemoglobinuria, but was otherwise unremarkable.  CBC showed a white count of 24.3, hemoglobin 8.4 g/dL and platelets 893.  Unremarkable i-STAT hCG, troponin, lipase, acetaminophen, salicylate and  alcohol level.  MRSA by PCR was negative.  SARS coronavirus 2 was negative.  Imaging: Portable 1 view chest radiograph showed improved aeration bilaterally with resolution of previously seen airspace opacities.  There is a small left-sided pleural effusion, which has decreased compared to the previous exam.  Please see image and full radiology report for further detail.  Review of Systems: As per HPI otherwise all other systems reviewed and are negative.  Past Medical History:  Diagnosis Date  . Anemia   . Asthma   . DJD (degenerative joint disease)   . Endocarditis   . Hepatitis C   . Substance abuse [facility])    Past Surgical History:  Procedure Laterality Date  . INGUINAL HERNIA REPAIR Left 04/28/2013   Procedure: HERNIA REPAIR INGUINAL ADULT with mesh;  Surgeon: Cherylynn Ridges, MD;  Location: [facility] OR;  Service: General;  Laterality: Left;  . TEE WITHOUT CARDIOVERSION N/A 11/18/2015   Procedure: TRANSESOPHAGEAL ECHOCARDIOGRAM (TEE);  Surgeon: Iran Ouch, MD;  Location: [facility] ORS;  Service: Cardiovascular;  Laterality: N/A;  . TEE WITHOUT CARDIOVERSION N/A 12/31/2017   Procedure: TRANSESOPHAGEAL ECHOCARDIOGRAM (TEE);  Surgeon: Dalia Heading, MD;  Location: [facility] ORS;  Service: Cardiovascular;  Laterality: N/A;  . TEE WITHOUT CARDIOVERSION N/A 01/04/2018   Procedure: TRANSESOPHAGEAL ECHOCARDIOGRAM (TEE);  Surgeon: Dalia Heading, MD;  Location: [facility] ORS;  Service: Cardiovascular;  Laterality: N/A;  . tricuspid valve replacement    . TUMOR EXCISION     left scalpula   Social History  reports that she has been smoking cigarettes. She has never used  smokeless tobacco. She reports current drug use. She reports that she does not drink alcohol.  No Known Allergies  Family History  Problem Relation Age of Onset  . Cancer Mother   . Cancer - Colon Maternal Grandfather   . Diabetes Maternal Grandfather    Prior to Admission medications   Medication Sig Start Date End Date Taking?  Authorizing Provider  amoxicillin-clavulanate (AUGMENTIN) 875-125 MG tablet Take 1 tablet by mouth 2 (two) times daily. Patient not taking: Reported on 09/23/2020    [provider]  aspirin EC 81 MG tablet Take 81 mg by mouth daily. Patient not taking: Reported on 09/23/2020    [provider]  Buprenorphine HCl-Naloxone HCl 8-2 MG FILM Place under the tongue. Patient not taking: Reported on 09/23/2020    [provider]  doxycycline (VIBRAMYCIN) 100 MG capsule Take 100 mg by mouth 2 (two) times daily. Patient not taking: Reported on 09/23/2020    [provider]  gabapentin (NEURONTIN) 300 MG capsule Take 300 mg by mouth 3 (three) times daily. Take 2 caps in the AM, midday, and 3 caps at night Patient not taking: Reported on 09/23/2020    [provider]  hydrOXYzine (VISTARIL) 25 MG capsule Take 25 mg by mouth 4 (four) times daily. Patient not taking: Reported on 09/23/2020    [provider]   Physical Exam: Vitals:   09/29/20 1751 09/29/20 1900 09/29/20 1930  BP: 130/71 127/61 133/71  Pulse: 99 (!) 115 (!) 119  Resp: (!) 22 12   Temp: 99.9 F (37.7 C)    SpO2: 97% 91% 92%   Constitutional: Frail, chronically ill looking. Eyes: PERRL, lids and conjunctivae are injected. ENMT: Mucous membranes are mildly dry. Posterior pharynx clear of any exudate or lesions. Neck: normal, supple, no masses, no thyromegaly Respiratory: clear to auscultation bilaterally, no wheezing, no crackles. Normal respiratory effort. No accessory muscle use.  Cardiovascular: Regular rate and rhythm, no murmurs / rubs / gallops. No extremity edema. 2+ pedal pulses. No carotid bruits.  Abdomen: No distention.  Soft, no tenderness, no masses palpated. No hepatosplenomegaly. Bowel sounds positive.  Musculoskeletal: no clubbing / cyanosis.  Good ROM, no contractures. Normal muscle tone.  Skin: Few abrasions in all track marks on upper extremities. Neurologic: CN 2-12  grossly intact. Sensation intact, DTR normal. Strength 5/5 in all 4.  Psychiatric: Seems to have impaired judgment and insight of her medical conditions. Alert and oriented x 3.  Mildly anxious mood.   Labs on Admission: I have personally reviewed following labs and imaging studies  CBC: Recent Labs  Lab 09/25/20 0413 09/26/20 0525 09/27/20 0500 09/28/20 0439 09/29/20 0533 09/29/20 1106 09/29/20 1843  WBC 22.8* 16.6* 19.8* 14.1* 12.2*  --  24.3*  NEUTROABS 19.8* 13.6* 16.9* 10.1*  --   --  19.4*  HGB 8.4* 8.6* 8.5* 7.6* 7.0* 7.1* 8.4*  HCT 26.0* 25.8* 25.4* 24.0* 22.0* 22.9* 26.9*  MCV 81.0 77.9* 78.6* 82.2 82.1  --  82.3  PLT 61* 66* 69* 65* 119*  --  206    Basic Metabolic Panel: Recent Labs  Lab 09/25/20 0413 09/26/20 0525 09/27/20 0500 09/28/20 0439 09/29/20 0533 09/29/20 1843  NA 140 145 146* 148* 146* 144  K 3.9 3.4* 3.2* 3.5 3.3* 2.4*  CL 111 116* 118* 121* 120* 116*  CO2 19* 21* 23 22 21* 21*  GLUCOSE 125* 153* 142* 139* 147* 95  BUN 27* 24* 23* 24* 22* 15  CREATININE 0.93 0.83 0.69 0.74  0.62 0.65  CALCIUM 7.1* 7.8* 7.7* 7.5* 7.2* 8.0*  MG 2.6* 2.9* 2.6* 2.4 2.3  --   PHOS 4.0 2.4* 2.6 3.8 3.5  --     GFR: Estimated Creatinine Clearance: 63.1 mL/min (by C-G formula based on SCr of 0.65 mg/dL).  Liver Function Tests: Recent Labs  Lab 09/25/20 0413 09/26/20 0525 09/27/20 0500 09/28/20 0439 09/29/20 1843  AST 71* 18 18 20 23   ALT 31 25 21 19 21   ALKPHOS 58 63 63 48 48  BILITOT 0.8 0.6 0.9 0.9 1.0  PROT 5.7* 6.1* 5.8* 5.4* 6.0*  ALBUMIN 2.5* 2.5* 2.3* 2.2* 2.5*   Radiological Exams on Admission: DG Chest Port 1 View  Result Date: 09/29/2020 CLINICAL DATA:  Possible sepsis EXAM: PORTABLE CHEST 1 VIEW COMPARISON:  09/26/2020, CT 11/13/2015 FINDINGS: Extensive surgical hardware in the cervicothoracic spine. Post sternotomy changes and valve prosthesis. Interval extubation and removal of esophageal tubes. Trace left-sided pleural effusion, decreased  compared to prior. Improved aeration bilaterally with clearing of previously noted airspace disease. Stable cardiomediastinal silhouette. No pneumothorax IMPRESSION: Improved aeration bilaterally with resolution of previously noted airspace opacities. Small left-sided pleural effusion, decreased compared to prior Electronically Signed   By: 09/28/2020 M.D.   On: 09/29/2020 20:33   EKG: Independently reviewed.  Vent. rate 103 BPM PR interval 112 ms QRS duration 110 ms QT/QTcB 360/471 ms P-R-T axes 78 70 77 Sinus tachycardia Incomplete right bundle branch block T wave abnormality, consider anterior ischemia  Assessment/Plan Principal Problem:   Endocarditis of prosthetic tricuspid valve (HCC) Will be readmitted to [facility] now at [facility]. Place in observation/telemetry. Continue cefepime per pharmacy. Continue gentamicin per pharmacy. Continue rifampin 300 mg IVPB every 8 hours. Continue vancomycin per pharmacy. Consider infectious diseases input. Follow-up CBC and CMP.  Active Problems:   Opiate abuse, continuous (HCC) Discharged from [facility] addiction medicine program earlier this month. Consult TOC.    Protein-calorie malnutrition, severe In the setting of liver cirrhosis, endocarditis and recent SBP. Consider evaluation by nutritional services.    Hypokalemia Replacing. Follow-up potassium level.    Anemia in other chronic diseases classified elsewhere Monitor hematocrit and hemoglobin. Transfuse as needed.    Chronic hepatitis C without hepatic coma (HCC) Per patient, she was told that "she cleared the infection".    Cirrhosis of liver without ascites (HCC) No signs of decompensation. Fluid and sodium restriction.    DVT prophylaxis: SCDs. Code Status:   Full code. Family Communication: Disposition Plan:   Patient is from:  Home.  Anticipated DC to:  TBD.  Anticipated DC date:  TBD.  Anticipated DC barriers:  Consults called: Admission  status:  Observation/telemetry.   Severity of Illness: High severity due to endocarditis of prosthetic tricuspid valve in the setting of IVDA opiate abuse and severe protein calorie malnutrition.  The patient is being readmitted to resume IV antibiotic therapy.  [facility] MD [facility]  How to contact the [facility] Attending or Consulting provider 7A - 7P or covering provider during after hours 7P -7A, for this patient?   1. Check the care team in [facility] and look for a) attending/consulting TR[facility] provider listed and b) the [facility] team listed 2. Log into [email or web address] and use [facility] universal password to access. If you do not have the password, please contact the hospital operator. 3. Locate the [facility] provider you are looking for under [facility] and page to a number that you can be directly reached. 4. If you  still have difficulty reaching the provider, please page the [facility] (Director on Call) for the Hospitalists listed on amion for assistance.  09/29/2020, 9:15 PM   This document was prepared using Dragon voice recognition software and may contain some unintended transcription errors.

## 2020-09-29 NOTE — Progress Notes (Signed)
Elink following for Sepsis Protocol 

## 2020-09-29 NOTE — Progress Notes (Signed)
Patient yelling out for help, multiple attempts to calm patient. Patient is restless and irritable. This RN and Diannia Ruder, NT at bedside multiple times to assist patient.  Offered TV remote, repositioning and bedpan.  Patient moaning loudly, complaining of "all over" pain and swearing, stating she needs medicine because of "what she does on the street". Dr. Karna Christmas at bedside to evaluate.   Patient upset that she can not have anything for pain, states she will have to leave if she does not get something to treat her pain. Reports doing heroin before coming to the hospital several days ago.   Encouraged patient to rest, discussed the need for her to stay here to receive the treatment she needs. Discussed the risks of leaving the hospital at this time. Patient still adamant that she needs to leave. Patient requested a phone, patient provided with phone in her room.

## 2020-09-29 NOTE — ED Triage Notes (Signed)
Pt reports intermittent chest pain and fever.  States she has been in a coma at [facility] for 1 week and that she woke up today and signed herself out AMA.  States she came here to have "heart surgery".  Pt was admitted for endocarditis.  Denies pain at present.

## 2020-09-29 NOTE — Progress Notes (Signed)
Pt extubated without complications, placed on [place]. Tolerating well at this time.

## 2020-09-30 DIAGNOSIS — Z20822 Contact with and (suspected) exposure to covid-19: Secondary | ICD-10-CM | POA: Diagnosis present

## 2020-09-30 DIAGNOSIS — B182 Chronic viral hepatitis C: Secondary | ICD-10-CM

## 2020-09-30 DIAGNOSIS — T826XXA Infection and inflammatory reaction due to cardiac valve prosthesis, initial encounter: Secondary | ICD-10-CM | POA: Diagnosis present

## 2020-09-30 DIAGNOSIS — I330 Acute and subacute infective endocarditis: Secondary | ICD-10-CM | POA: Diagnosis present

## 2020-09-30 DIAGNOSIS — K7460 Unspecified cirrhosis of liver: Secondary | ICD-10-CM | POA: Diagnosis present

## 2020-09-30 DIAGNOSIS — B373 Candidiasis of vulva and vagina: Secondary | ICD-10-CM | POA: Diagnosis present

## 2020-09-30 DIAGNOSIS — A419 Sepsis, unspecified organism: Principal | ICD-10-CM

## 2020-09-30 DIAGNOSIS — Z8619 Personal history of other infectious and parasitic diseases: Secondary | ICD-10-CM | POA: Diagnosis not present

## 2020-09-30 DIAGNOSIS — I38 Endocarditis, valve unspecified: Secondary | ICD-10-CM

## 2020-09-30 DIAGNOSIS — Z8614 Personal history of Methicillin resistant Staphylococcus aureus infection: Secondary | ICD-10-CM | POA: Diagnosis not present

## 2020-09-30 DIAGNOSIS — Z5900 Homelessness unspecified: Secondary | ICD-10-CM | POA: Diagnosis not present

## 2020-09-30 DIAGNOSIS — Z79899 Other long term (current) drug therapy: Secondary | ICD-10-CM | POA: Diagnosis not present

## 2020-09-30 DIAGNOSIS — E43 Unspecified severe protein-calorie malnutrition: Secondary | ICD-10-CM | POA: Diagnosis present

## 2020-09-30 DIAGNOSIS — B370 Candidal stomatitis: Secondary | ICD-10-CM | POA: Diagnosis present

## 2020-09-30 DIAGNOSIS — Z5329 Procedure and treatment not carried out because of patient's decision for other reasons: Secondary | ICD-10-CM | POA: Diagnosis present

## 2020-09-30 DIAGNOSIS — F1110 Opioid abuse, uncomplicated: Secondary | ICD-10-CM

## 2020-09-30 DIAGNOSIS — Z833 Family history of diabetes mellitus: Secondary | ICD-10-CM | POA: Diagnosis not present

## 2020-09-30 DIAGNOSIS — J189 Pneumonia, unspecified organism: Secondary | ICD-10-CM | POA: Diagnosis present

## 2020-09-30 DIAGNOSIS — F17210 Nicotine dependence, cigarettes, uncomplicated: Secondary | ICD-10-CM | POA: Diagnosis present

## 2020-09-30 DIAGNOSIS — Y831 Surgical operation with implant of artificial internal device as the cause of abnormal reaction of the patient, or of later complication, without mention of misadventure at the time of the procedure: Secondary | ICD-10-CM | POA: Diagnosis present

## 2020-09-30 DIAGNOSIS — E876 Hypokalemia: Secondary | ICD-10-CM | POA: Diagnosis present

## 2020-09-30 DIAGNOSIS — J9601 Acute respiratory failure with hypoxia: Secondary | ICD-10-CM | POA: Diagnosis present

## 2020-09-30 DIAGNOSIS — F1120 Opioid dependence, uncomplicated: Secondary | ICD-10-CM | POA: Diagnosis present

## 2020-09-30 DIAGNOSIS — D638 Anemia in other chronic diseases classified elsewhere: Secondary | ICD-10-CM | POA: Diagnosis present

## 2020-09-30 DIAGNOSIS — Z86711 Personal history of pulmonary embolism: Secondary | ICD-10-CM | POA: Diagnosis not present

## 2020-09-30 DIAGNOSIS — I76 Septic arterial embolism: Secondary | ICD-10-CM | POA: Diagnosis present

## 2020-09-30 LAB — RAPID URINE DRUG SCREEN, HOSP PERFORMED
Amphetamines: NOT DETECTED
Barbiturates: NOT DETECTED
Benzodiazepines: POSITIVE — AB
Cocaine: NOT DETECTED
Opiates: POSITIVE — AB
Tetrahydrocannabinol: NOT DETECTED

## 2020-09-30 LAB — URINALYSIS, ROUTINE W REFLEX MICROSCOPIC
Bacteria, UA: NONE SEEN
Bilirubin Urine: NEGATIVE
Glucose, UA: NEGATIVE mg/dL
Ketones, ur: NEGATIVE mg/dL
Leukocytes,Ua: NEGATIVE
Nitrite: NEGATIVE
Protein, ur: NEGATIVE mg/dL
Specific Gravity, Urine: 1.01 (ref 1.005–1.030)
pH: 6 (ref 5.0–8.0)

## 2020-09-30 LAB — CSF CULTURE W GRAM STAIN
Culture: NO GROWTH
Gram Stain: NONE SEEN

## 2020-09-30 LAB — MRSA PCR SCREENING: MRSA by PCR: NEGATIVE

## 2020-09-30 MED ORDER — OXYCODONE HCL 5 MG PO TABS
5.0000 mg | ORAL_TABLET | ORAL | Status: DC | PRN
  Administered 2020-09-30: 10 mg via ORAL
  Filled 2020-09-30: qty 2

## 2020-09-30 MED ORDER — SODIUM CHLORIDE 0.9 % IV SOLN
12.5000 mg | Freq: Four times a day (QID) | INTRAVENOUS | Status: DC | PRN
  Administered 2020-09-30 – 2020-10-04 (×9): 12.5 mg via INTRAVENOUS
  Filled 2020-09-30 (×10): qty 0.5

## 2020-09-30 MED ORDER — BUPRENORPHINE HCL 2 MG SL SUBL
2.0000 mg | SUBLINGUAL_TABLET | Freq: Every day | SUBLINGUAL | Status: DC
  Administered 2020-09-30: 2 mg via SUBLINGUAL
  Filled 2020-09-30 (×2): qty 1

## 2020-09-30 MED ORDER — ONDANSETRON HCL 4 MG/2ML IJ SOLN
4.0000 mg | Freq: Four times a day (QID) | INTRAMUSCULAR | Status: DC | PRN
  Administered 2020-09-30: 4 mg via INTRAVENOUS
  Filled 2020-09-30: qty 2

## 2020-09-30 MED ORDER — MORPHINE SULFATE (PF) 4 MG/ML IV SOLN
4.0000 mg | Freq: Once | INTRAVENOUS | Status: AC
Start: 2020-09-30 — End: 2020-09-30
  Administered 2020-09-30: 4 mg via INTRAVENOUS
  Filled 2020-09-30: qty 1

## 2020-09-30 MED ORDER — BUPRENORPHINE HCL 2 MG SL SUBL
4.0000 mg | SUBLINGUAL_TABLET | Freq: Every day | SUBLINGUAL | Status: DC
  Administered 2020-10-01 – 2020-10-02 (×2): 4 mg via SUBLINGUAL
  Filled 2020-09-30 (×2): qty 2

## 2020-09-30 MED ORDER — POTASSIUM CHLORIDE IN NACL 40-0.9 MEQ/L-% IV SOLN
INTRAVENOUS | Status: AC
  Filled 2020-09-30: qty 1000

## 2020-09-30 MED ORDER — RIFAMPIN 300 MG PO CAPS
300.0000 mg | ORAL_CAPSULE | Freq: Three times a day (TID) | ORAL | Status: DC
  Administered 2020-09-30 – 2020-10-03 (×8): 300 mg via ORAL
  Filled 2020-09-30 (×12): qty 1

## 2020-09-30 MED ORDER — BUPRENORPHINE HCL 2 MG SL SUBL
2.0000 mg | SUBLINGUAL_TABLET | Freq: Once | SUBLINGUAL | Status: AC
  Administered 2020-09-30: 2 mg via SUBLINGUAL
  Filled 2020-09-30: qty 1

## 2020-09-30 NOTE — Plan of Care (Signed)
  Problem: Education: Goal: Knowledge of General Education information will improve Description Including pain rating scale, medication(s)/side effects and non-pharmacologic comfort measures Outcome: Progressing   

## 2020-09-30 NOTE — Progress Notes (Signed)
Per andrew/pharm, ok to run rifampin IVPB with normal saline

## 2020-09-30 NOTE — Consult Note (Signed)
[facility]  Total days of antibiotics 2         Reason for Consult: culture negative TV prosthetic valve endocarditis    Referring Physician: [email or web address]  Principal Problem:   Endocarditis of prosthetic tricuspid valve (HCC) Active Problems:   Opiate abuse, continuous (HCC)   Protein-calorie malnutrition, severe   Hypokalemia   Anemia in other chronic diseases classified elsewhere   Chronic hepatitis C without hepatic coma (HCC)   Cirrhosis of liver without ascites (HCC)    HPI: Andrea King is a 45 y.o. female with history chronic hep C, cirrhosis, opiate use/dependence, hx of TV endocarditis s/p TV repair in 2017, MRSA lumbar discitis/vertebral epidural phlegmon treated with clinda then doxy,  Hx of MSSA PVE with redo TV valve and PFO repair in 2021, hx of serratia bacteremia remotely. Admitted to [facility] originally for fevers, AMS on 5/23 found to have prosthetic valve endocarditis on TEE. Was on cefepime, gent, vanco, and rifampin, during hospitalization required intubation but subsequently extubated on 5/29. She was conscious to decide to leave AMA. Her family however brought her back to the hospital to finish out her medical treatment at [facility]. She was reinitiated on her 4 drug therapy for culture negative PVE. She reports still having fevers, drenching nightsweats.  Past Medical History:  Diagnosis Date  . Anemia   . Asthma   . DJD (degenerative joint disease)   . Endocarditis   . Hepatitis C   . Substance abuse (HCC)     Allergies: No Known Allergies  MEDICATIONS: . buprenorphine  2 mg Sublingual Daily  . rifampin  300 mg Oral Q8H    Social History   Tobacco Use  . Smoking status: Current Every Day Smoker    Types: Cigarettes  . Smokeless tobacco: Never Used  Substance Use Topics  . Alcohol use: No    Alcohol/week: 0.0 standard drinks  . Drug use: Yes    Comment: cocaine,Heroine    Family History  Problem Relation Age of Onset  .  Cancer Mother   . Cancer - Colon Maternal Grandfather   . Diabetes Maternal Grandfather      Review of Systems  Constitutional: + for fever, chills, diaphoresis, activity change, appetite change, fatigue and unexpected weight change.  HENT: Negative for congestion, sore throat, rhinorrhea, sneezing, trouble swallowing and sinus pressure.  Eyes: Negative for photophobia and visual disturbance.  Respiratory: Negative for cough, chest tightness, shortness of breath, wheezing and stridor.  Cardiovascular: Negative for chest pain, palpitations and leg swelling.  Gastrointestinal: + nausea.Negative for vomiting, abdominal pain, diarrhea, constipation, blood in stool, abdominal distention and anal bleeding.  Genitourinary: Negative for dysuria, hematuria, flank pain and difficulty urinating.  Musculoskeletal: Negative for myalgias, back pain, joint swelling, arthralgias and gait problem.  Skin: Negative for color change, pallor, rash and wound.  Neurological: Negative for dizziness, tremors, weakness and light-headedness.  Hematological: Negative for adenopathy. Does not bruise/bleed easily.  Psychiatric/Behavioral: Negative for behavioral problems, confusion, sleep disturbance, dysphoric mood, decreased concentration and agitation.     OBJECTIVE: Temp:  [98 F (36.7 C)-99.9 F (37.7 C)] 98.6 F (37 C) (05/30 0736) Pulse Rate:  [99-119] 109 (05/30 0736) Resp:  [12-26] 26 (05/30 0736) BP: (115-133)/(61-76) 123/76 (05/30 0736) SpO2:  [91 %-97 %] 94 % (05/30 0736) Weight:  [64.1 kg] 64.1 kg (05/30 0015) Physical Exam  Constitutional:  oriented to person, place,only. appears disheveled and under-nourished. No distress.  HENT: [place]/AT, PERRLA, no scleral icterus  Mouth/Throat: Oropharynx is clear and moist. No oropharyngeal exudate. Poor dentition Cardiovascular: Normal rate, regular rhythm and normal heart sounds. Exam reveals no gallop and no friction rub.  No murmur heard.  Pulmonary/Chest:  Effort normal and breath sounds normal. No respiratory distress.  has no wheezes.  Neck = supple, no nuchal rigidity Abdominal: Soft. Bowel sounds are normal.  exhibits no distension. There is no tenderness.  Lymphadenopathy: no cervical adenopathy. No axillary adenopathy Neurological: alert and oriented to person, place, and time.  Skin: Scattered echymosis on all extremities Psychiatric: sleepy   LABS: Results for orders placed or performed during the hospital encounter of 09/29/20 (from the past 48 hour(s))  Lactic acid, plasma     Status: None   Collection Time: 09/29/20  6:42 PM  Result Value Ref Range   Lactic Acid, Venous 1.5 0.5 - 1.9 mmol/L    Comment: Performed at [facility], [address] [place], [place] [address]  CBC with Differential     Status: Abnormal   Collection Time: 09/29/20  6:43 PM  Result Value Ref Range   WBC 24.3 (H) 4.0 - 10.5 K/uL   RBC 3.27 (L) 3.87 - 5.11 MIL/uL   Hemoglobin 8.4 (L) 12.0 - 15.0 g/dL   HCT 50.0 (L) 93.8 - 18.2 %   MCV 82.3 80.0 - 100.0 fL   MCH 25.7 (L) 26.0 - 34.0 pg   MCHC 31.2 30.0 - 36.0 g/dL   RDW 99.3 71.6 - 96.7 %   Platelets 206 150 - 400 K/uL   nRBC 0.2 0.0 - 0.2 %   Neutrophils Relative % 80 %   Neutro Abs 19.4 (H) 1.7 - 7.7 K/uL   Lymphocytes Relative 18 %   Lymphs Abs 4.4 (H) 0.7 - 4.0 K/uL   Monocytes Relative 1 %   Monocytes Absolute 0.2 0.1 - 1.0 K/uL   Eosinophils Relative 1 %   Eosinophils Absolute 0.2 0.0 - 0.5 K/uL   Basophils Relative 0 %   Basophils Absolute 0.0 0.0 - 0.1 K/uL   WBC Morphology See Note     Comment: Increased Bands. >20% Bands  Moderate Left Shift. >5% Metas and Myelos, Occ Pro Noted.    nRBC 0 0 /100 WBC   Abs Immature Granulocytes 0.00 0.00 - 0.07 K/uL    Comment: Performed at [facility], [address] [place], [place] [address]  Comprehensive metabolic panel     Status: Abnormal   Collection Time: 09/29/20  6:43 PM  Result Value Ref Range   Sodium 144 135 - 145  mmol/L   Potassium 2.4 (LL) 3.5 - 5.1 mmol/L    Comment: CRITICAL RESULT CALLED TO, READ BACK BY AND VERIFIED WITH: T.BERRY RN @ 1958 09/29/2020 BY C.EDENS    Chloride 116 (H) 98 - 111 mmol/L   CO2 21 (L) 22 - 32 mmol/L   Glucose, Bld 95 70 - 99 mg/dL    Comment: Glucose reference range applies only to samples taken after fasting for at least 8 hours.   BUN 15 6 - 20 mg/dL   Creatinine, Ser 0.17 0.44 - 1.00 mg/dL   Calcium 8.0 (L) 8.9 - 10.3 mg/dL   Total Protein 6.0 (L) 6.5 - 8.1 g/dL   Albumin 2.5 (L) 3.5 - 5.0 g/dL   AST 23 15 - 41 U/L   ALT 21 0 - 44 U/L   Alkaline Phosphatase 48 38 - 126 U/L   Total Bilirubin 1.0 0.3 - 1.2 mg/dL  GFR, Estimated >60 >60 mL/min    Comment: (NOTE) Calculated using the CKD-EPI Creatinine Equation (2021)    Anion gap 7 5 - 15    Comment: Performed at [facility], [address], [place], [place] [address]  Lipase, blood     Status: None   Collection Time: 09/29/20  6:43 PM  Result Value Ref Range   Lipase 44 11 - 51 U/L    Comment: Performed at [facility], [address], [place], [place] [address]  Troponin I (High Sensitivity)     Status: None   Collection Time: 09/29/20  6:43 PM  Result Value Ref Range   Troponin I (High Sensitivity) 10 <18 ng/L    Comment: (NOTE) Elevated high sensitivity troponin I (hsTnI) values and significant  changes across serial measurements may suggest ACS but many other  chronic and acute conditions are known to elevate hsTnI results.  Refer to the Links section for chest pain algorithms and additional  guidance. Performed at [facility], [address] [place], [place] [address]   I-Stat Beta hCG blood, ED ([facility], [facility], [facility] only)     Status: None   Collection Time: 09/29/20  7:22 PM  Result Value Ref Range   I-stat hCG, quantitative <5.0 <5 mIU/mL   Comment 3            Comment:   GEST. AGE      CONC.  (mIU/mL)   <=1 WEEK        5 - 50     2 WEEKS       50 - 500     3 WEEKS       100 -  10,000     4 WEEKS     1,000 - 30,000        FEMALE AND NON-PREGNANT FEMALE:     LESS THAN 5 mIU/mL   Blood culture (routine x 2)     Status: None (Preliminary result)   Collection Time: 09/29/20  8:20 PM   Specimen: Right Antecubital; Blood  Result Value Ref Range   Specimen Description RIGHT ANTECUBITAL    Special Requests      BOTTLES DRAWN AEROBIC AND ANAEROBIC Blood Culture adequate volume   Culture      NO GROWTH < 12 HOURS Performed at [facility], [address], [place], [place] [address]    Report Status PENDING   Ethanol     Status: None   Collection Time: 09/29/20  8:37 PM  Result Value Ref Range   Alcohol, Ethyl (B) <10 <10 mg/dL    Comment: (NOTE) Lowest detectable limit for serum alcohol is 10 mg/dL.  For medical purposes only. Performed at [facility], [address], [place], [place] [address]   Salicylate level     Status: Abnormal   Collection Time: 09/29/20  8:37 PM  Result Value Ref Range   Salicylate Lvl <7.0 (L) 7.0 - 30.0 mg/dL    Comment: Performed at [facility], [address] [place], [place] [address]  Acetaminophen level     Status: Abnormal   Collection Time: 09/29/20  8:37 PM  Result Value Ref Range   Acetaminophen (Tylenol), Serum <10 (L) 10 - 30 ug/mL    Comment: (NOTE) Therapeutic concentrations vary significantly. A range of 10-30 ug/mL  may be an effective concentration for many patients. However, some  are best treated at concentrations outside of this range.  Acetaminophen concentrations >150 ug/mL at 4 hours after ingestion  and >50 ug/mL at 12 hours after ingestion are often associated with  toxic reactions.  Performed at [facility], [address] [place], [place] [address]   Resp Panel by RT-PCR (Flu A&B, Covid) Nasopharyngeal Swab     Status: None   Collection Time: 09/29/20 10:17 PM   Specimen: Nasopharyngeal Swab; Nasopharyngeal(NP) swabs in vial transport medium  Result Value Ref Range    SARS Coronavirus 2 by RT PCR NEGATIVE NEGATIVE    Comment: (NOTE) SARS-CoV-2 target nucleic acids are NOT DETECTED.  The SARS-CoV-2 RNA is generally detectable in upper respiratory specimens during the acute phase of infection. The lowest concentration of SARS-CoV-2 viral copies this assay can detect is 138 copies/mL. A negative result does not preclude SARS-Cov-2 infection and should not be used as the sole basis for treatment or other patient management decisions. A negative result may occur with  improper specimen collection/handling, submission of specimen other than nasopharyngeal swab, presence of viral mutation(s) within the areas targeted by this assay, and inadequate number of viral copies(<138 copies/mL). A negative result must be combined with clinical observations, patient history, and epidemiological information. The expected result is Negative.  Fact Sheet for Patients:  BloggerCourse.comhttps://www.fda.gov/media/152166/download  Fact Sheet for Healthcare Providers:  SeriousBroker.ithttps://www.fda.gov/media/152162/download  This test is no t yet approved or cleared by the [place] FDA and  has been authorized for detection and/or diagnosis of SARS-CoV-2 by FDA under an Emergency Use Authorization (EUA). This EUA will remain  in effect (meaning this test can be used) for the duration of the COVID-19 declaration under Section 564(b)(1) of the Act, 21 U.S.C.section 360bbb-3(b)(1), unless the authorization is terminated  or revoked sooner.       Influenza A by PCR NEGATIVE NEGATIVE   Influenza B by PCR NEGATIVE NEGATIVE    Comment: (NOTE) The Xpert Xpress SARS-CoV-2/FLU/RSV plus assay is intended as an aid in the diagnosis of influenza from Nasopharyngeal swab specimens and should not be used as a sole basis for treatment. Nasal washings and aspirates are unacceptable for Xpert Xpress SARS-CoV-2/FLU/RSV testing.  Fact Sheet for Patients: BloggerCourse.comhttps://www.fda.gov/media/152166/download  Fact  Sheet for Healthcare Providers: SeriousBroker.ithttps://www.fda.gov/media/152162/download  This test is not yet approved or cleared by the [place] FDA and has been authorized for detection and/or diagnosis of SARS-CoV-2 by FDA under an Emergency Use Authorization (EUA). This EUA will remain in effect (meaning this test can be used) for the duration of the COVID-19 declaration under Section 564(b)(1) of the Act, 21 U.S.C. section 360bbb-3(b)(1), unless the authorization is terminated or revoked.  Performed at [facility], [address] [place], [place] [address]   MRSA PCR Screening     Status: None   Collection Time: 09/30/20  1:20 AM   Specimen: Nasal Mucosa; Nasopharyngeal  Result Value Ref Range   MRSA by PCR NEGATIVE NEGATIVE    Comment:        The GeneXpert MRSA Assay (FDA approved for NASAL specimens only), is one component of a comprehensive MRSA colonization surveillance program. It is not intended to diagnose MRSA infection nor to guide or monitor treatment for MRSA infections. Performed at [facility], [address] [place], [place] [address]   Rapid urine drug screen (hospital performed)     Status: Abnormal   Collection Time: 09/30/20  4:02 AM  Result Value Ref Range   Opiates POSITIVE (A) NONE DETECTED   Cocaine NONE DETECTED NONE DETECTED  Benzodiazepines POSITIVE (A) NONE DETECTED   Amphetamines NONE DETECTED NONE DETECTED   Tetrahydrocannabinol NONE DETECTED NONE DETECTED   Barbiturates NONE DETECTED NONE DETECTED    Comment: (NOTE) DRUG SCREEN FOR MEDICAL PURPOSES ONLY.  IF CONFIRMATION IS NEEDED FOR ANY PURPOSE, NOTIFY LAB WITHIN 5 DAYS.  LOWEST DETECTABLE LIMITS FOR URINE DRUG SCREEN Drug Class                     Cutoff (ng/mL) Amphetamine and metabolites    1000 Barbiturate and metabolites    200 Benzodiazepine                 200 Tricyclics and metabolites     300 Opiates and metabolites        300 Cocaine and metabolites         300 THC                            50 Performed at [facility], [address], [place], [place] [address]   Urinalysis, Routine w reflex microscopic Urine, Catheterized     Status: Abnormal   Collection Time: 09/30/20  4:02 AM  Result Value Ref Range   Color, Urine YELLOW YELLOW   APPearance CLEAR CLEAR   Specific Gravity, Urine 1.010 1.005 - 1.030   pH 6.0 5.0 - 8.0   Glucose, UA NEGATIVE NEGATIVE mg/dL   Hgb urine dipstick MODERATE (A) NEGATIVE   Bilirubin Urine NEGATIVE NEGATIVE   Ketones, ur NEGATIVE NEGATIVE mg/dL   Protein, ur NEGATIVE NEGATIVE mg/dL   Nitrite NEGATIVE NEGATIVE   Leukocytes,Ua NEGATIVE NEGATIVE   RBC / HPF 6-10 0 - 5 RBC/hpf   WBC, UA 0-5 0 - 5 WBC/hpf   Bacteria, UA NONE SEEN NONE SEEN   Squamous Epithelial / LPF 0-5 0 - 5    Comment: Performed at [facility], [address], [place], [place] [address]    MICRO: reviewed IMAGING: DG Chest Port 1 View  Result Date: 09/29/2020 CLINICAL DATA:  Possible sepsis EXAM: PORTABLE CHEST 1 VIEW COMPARISON:  09/26/2020, CT 11/13/2015 FINDINGS: Extensive surgical hardware in the cervicothoracic spine. Post sternotomy changes and valve prosthesis. Interval extubation and removal of esophageal tubes. Trace left-sided pleural effusion, decreased compared to prior. Improved aeration bilaterally with clearing of previously noted airspace disease. Stable cardiomediastinal silhouette. No pneumothorax IMPRESSION: Improved aeration bilaterally with resolution of previously noted airspace opacities. Small left-sided pleural effusion, decreased compared to prior Electronically Signed   By: Jasmine Pang M.D.   On: 09/29/2020 20:33   Assessment/Plan: 45yo F with chronic Hep C, opiate dependence/substance use admitted originally on 5/23 for AMS, respiratory distress requiring intubation found to have culture negative PVE with 1 cm veg on her prosthetic valve but left prior to completion of abtx. She is readmitted the  following <24hr to continue care.   = continue on culture negative regimen of vanco/cefepime/plus gent/rifampin to salvage valve. She is not a surgical candidate given she also has had a redo = will switch IV rif to oral due to shortage and the fact that she can take food by mouth  Leukocytosis = will restart on abtx and see to trend down  Pneumonia = in part can be due to septic emboli, continue on broad spectrum abtx  Hypokalemia = will need oral/IV repletion  Therapeutic drug monitoring = will need to watch cr closely due to being both on vanco  and gent.  She will need addn 5 wk of abtx treatment course  Opiate dependence = continues on buprenorphine

## 2020-09-30 NOTE — Progress Notes (Addendum)
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  On 5/24 she had respiratory failure and had to be intubated, eventually extubated 5/29 and then when she woke up she decided to leave AMA and apparently came straight to [facility], ER.  Subjective / 24h Interval events: She is concerned about withdrawals this morning.  Complains of generalized pain in her back, legs, abdomen, chest, complains of nausea and vomiting  Assessment & Plan: Principal Problem Tricuspid valve endocarditis, sepsis, POA-initially admitted at [facility] regional but left AMA on 5/29.  She underwent a TEE on 5/26 which showed EF 60-65%, normal RV, and evidence of tricuspid valve endocarditis with a 1 cm vegetation, and mild to moderate tricuspid valve regurgitation. -Infectious disease consulted, appreciate input.  Current cultures are negative but she does have a history of MRSA/Serratia bacteremia.  Due to altered mental status she underwent an LP 5/27 which was unremarkable without concerns for CNS infection. -Continue vancomycin, cefepime, gentamicin and rifampin -Monitor WBC and fever curve.  CT of her cervical, thoracic, lumbar spine done on 5/25 without acute abnormalities.  Low threshold to reimage with MRI if she has worsening localized pain  Active Problems Anemia of chronic disease-hemoglobin 8.4, monitor  Multifocal pneumonia-possibly with septic emboli, chest x-ray on admission shows improvement.  Continue antibiotics  Hypokalemia -replete and monitor.  This morning labs are  pending  Polysubstance abuse/IVDU -patient very concerned about withdrawals.  She prefers buprenorphine versus methadone.  I will start her on buprenorphine today, would like to avoid methadone as it cannot be prescribed at the time of discharge.  Liver cirrhosis, history of hep C -patient unaware of this diagnosis.  Monitor  Goals of care-palliative consulted while at [facility], remains full code/full scope.  No need to reinvolve palliative at this point  Scheduled Meds: . buprenorphine  2 mg Sublingual Daily   Continuous Infusions: . 0.9 % NaCl with KCl 40 mEq / L 100 mL/hr at 09/30/20 0406  . ceFEPime (MAXIPIME) IV 2 g (09/30/20 0537)  . gentamicin Stopped (09/30/20 0107)  . promethazine (PHENERGAN) injection (IM or IVPB) 12.5 mg (09/30/20 0338)  . rifampin (RIFADIN) IVPB 300 mg (09/30/20 0156)  . vancomycin Stopped (09/30/20 0108)   PRN Meds:.acetaminophen **OR** acetaminophen, promethazine (PHENERGAN) injection (IM or IVPB)  Diet Orders (From admission, onward)    Start     Ordered   09/29/20 2059  Diet Heart Room service appropriate? Yes; Fluid consistency: Thin  Diet effective now       Question Answer Comment  Room service appropriate? Yes   Fluid consistency: Thin      09/29/20 2102          DVT prophylaxis: SCDs Start: 09/29/20 2058     Code Status: Full Code  Family Communication: no family at bedside   Status is: Observation  The patient will require care spanning > 2 midnights and should be moved to inpatient because: Persistent severe electrolyte disturbances, IV treatments appropriate due to intensity of illness or inability to take PO and Inpatient level of care appropriate due to severity of illness  Dispo: The patient is from: Home              Anticipated d/c is to: Home              Patient currently is not medically stable to d/c.   Difficult to place patient No  Level of care: Telemetry Medical  Consultants:  ID  Procedures:   none  Microbiology  Blood cultures -pending  Antimicrobials: Vancomycin Gentamicin Rifampin Cefepime   Objective: Vitals:   09/29/20 1930 09/30/20 0015 09/30/20 0250 09/30/20 0736  BP: 133/71 115/70 122/72 123/76  Pulse: (!) 119 (!) 116 (!) 114 (!) 109  Resp:  (!) 23 20 (!) 26  Temp:  98 F (36.7 C) 98 F (36.7 C) 98.6 F (37 C)  TempSrc:  Oral Oral Oral  SpO2: 92% 92% 92% 94%  Weight:  64.1 kg    Height:   (1.575 m)      Intake/Output Summary (Last 24 hours) at 09/30/2020 0936 Last data filed at 09/30/2020 0800 Gross per 24 hour  Intake 480 ml  Output 1550 ml  Net -1070 ml   Filed Weights   09/30/20 0015  Weight: 64.1 kg    Examination:  Constitutional: NAD Eyes: no scleral icterus ENMT: Mucous membranes are moist.  Neck: normal, supple Respiratory: Diminished at the bases, no wheezing, no crackles, mildly tachypneic Cardiovascular: Regular rate and rhythm, no murmurs / rubs / gallops.  Trace LE edema. Good peripheral pulses Abdomen: Soft, diffusely tender palpation without guarding or rebound. Bowel sounds positive.  Musculoskeletal: no clubbing / cyanosis.  Skin: no rashes Neurologic: Nonfocal  Data Reviewed: I have independently reviewed following labs and imaging studies   CBC: Recent Labs  Lab 09/25/20 0413 09/26/20 0525 09/27/20 0500 09/28/20 0439 09/29/20 0533 09/29/20 1106 09/29/20 1843  WBC 22.8* 16.6* 19.8* 14.1* 12.2*  --  24.3*  NEUTROABS 19.8* 13.6* 16.9* 10.1*  --   --  19.4*  HGB 8.4* 8.6* 8.5* 7.6* 7.0* 7.1* 8.4*  HCT 26.0* 25.8* 25.4* 24.0* 22.0* 22.9* 26.9*  MCV 81.0 77.9* 78.6* 82.2 82.1  --  82.3  PLT 61* 66* 69* 65* 119*  --  206   Basic Metabolic Panel: Recent Labs  Lab 09/25/20 0413 09/26/20 0525 09/27/20 0500 09/28/20 0439 09/29/20 0533 09/29/20 1843  NA 140 145 146* 148* 146* 144  K 3.9 3.4* 3.2* 3.5 3.3* 2.4*  CL 111 116* 118* 121* 120* 116*  CO2 19* 21* 23 22 21* 21*  GLUCOSE 125* 153* 142* 139* 147*  95  BUN 27* 24* 23* 24* 22* 15  CREATININE 0.93 0.83 0.69 0.74 0.62 0.65  CALCIUM 7.1* 7.8* 7.7* 7.5* 7.2* 8.0*  MG 2.6* 2.9* 2.6* 2.4 2.3  --   PHOS 4.0 2.4* 2.6 3.8 3.5  --    Liver Function Tests: Recent Labs  Lab 09/25/20 0413 09/26/20 0525 09/27/20 0500 09/28/20 0439 09/29/20 1843  AST 71* ALT ALKPHOS 58 63 63 48 48  BILITOT 0.8 0.6 0.9 0.9 1.0  PROT 5.7* 6.1* 5.8* 5.4* 6.0*  ALBUMIN 2.5* 2.5* 2.3* 2.2* 2.5*   Coagulation Profile: Recent Labs  Lab 09/24/20 1003  INR 1.4*   HbA1C: No results for input(s): HGBA1C in the last 72 hours. CBG: Recent Labs  Lab 09/28/20 1944 09/28/20 2336 09/29/20 0400 09/29/20 0728 09/29/20 1116  GLUCAP 131* 134* 124* 137* 98    Recent Results (from the past 240 hour(s))  Resp Panel by RT-PCR (Flu A&B, Covid) Nasopharyngeal Swab     Status: None   Collection Time: 09/23/20  6:16 AM   Specimen: Nasopharyngeal Swab; Nasopharyngeal(NP) swabs in vial transport medium  Result Value Ref Range Status   SARS Coronavirus 2 by RT PCR NEGATIVE NEGATIVE Final    Comment: (NOTE) SARS-CoV-2 target nucleic acids are NOT DETECTED.  The SARS-CoV-2 RNA is generally detectable in upper respiratory specimens during the acute phase of infection. The lowest concentration of SARS-CoV-2 viral copies this assay can detect is 138 copies/mL. A negative result does not preclude SARS-Cov-2 infection and should not be used as the sole basis for treatment or other patient management decisions. A negative result may occur with  improper specimen collection/handling, submission of specimen other than nasopharyngeal swab, presence of viral mutation(s) within the areas targeted by this assay, and inadequate number of viral copies(<138 copies/mL). A negative result must be combined with clinical observations, patient history, and epidemiological information. The expected result is Negative.  Fact Sheet for Patients:   BloggerCourse.comhttps://www.fda.gov/media/152166/download  Fact Sheet for Healthcare Providers:  SeriousBroker.ithttps://www.fda.gov/media/152162/download  This test is no t yet approved or cleared by the [place] FDA and  has been authorized for detection and/or diagnosis of SARS-CoV-2 by FDA under an Emergency Use Authorization (EUA). This EUA will remain  in effect (meaning this test can be used) for the duration of the COVID-19 declaration under Section 564(b)(1) of the Act, 21 U.S.C.section 360bbb-3(b)(1), unless the authorization is terminated  or revoked sooner.       Influenza A by PCR NEGATIVE NEGATIVE Final   Influenza B by PCR NEGATIVE NEGATIVE Final    Comment: (NOTE) The Xpert Xpress SARS-CoV-2/FLU/RSV plus assay is intended as an aid in the diagnosis of influenza from Nasopharyngeal swab specimens and should not be used as a sole basis for treatment. Nasal washings and aspirates are unacceptable for Xpert Xpress SARS-CoV-2/FLU/RSV testing.  Fact Sheet for Patients: BloggerCourse.comhttps://www.fda.gov/media/152166/download  Fact Sheet for Healthcare Providers: SeriousBroker.ithttps://www.fda.gov/media/152162/download  This test is not yet approved or cleared by the [place] FDA and has been authorized for detection and/or diagnosis of SARS-CoV-2 by FDA under an Emergency Use Authorization (EUA). This EUA will remain in effect (meaning this test can be used) for the duration of the COVID-19 declaration under Section 564(b)(1) of the Act, 21 U.S.C. section 360bbb-3(b)(1), unless the authorization is terminated or revoked.  Performed at [facility], [address] [place], [place] [address]   Culture, blood (x 2)     Status: None   Collection Time: 09/23/20  6:54 AM   Specimen: BLOOD  Result Value Ref Range Status   Specimen Description BLOOD RIGHT FA  Final   Special Requests   Final    BOTTLES DRAWN AEROBIC ONLY Blood Culture adequate volume   Culture   Final    NO GROWTH 5 DAYS Performed at  [facility], [address] [place], [place] [address]    Report Status 09/28/2020 FINAL  Final  MRSA PCR Screening     Status: None   Collection Time: 09/23/20  9:33 AM   Specimen: Nasal Mucosa; Nasopharyngeal  Result Value Ref Range Status   MRSA by PCR NEGATIVE NEGATIVE Final    Comment:        The GeneXpert MRSA Assay (FDA approved for NASAL specimens only), is one component of a comprehensive MRSA colonization surveillance program. It is not intended to diagnose MRSA infection nor to guide or monitor treatment for MRSA  infections. Performed at [facility], [address] [place], [place] [address]   Urine Culture     Status: None   Collection Time: 09/24/20 12:51 AM   Specimen: Urine, Random  Result Value Ref Range Status   Specimen Description   Final    URINE, RANDOM Performed at [facility], [address] [place], [place] [address]    Special Requests   Final    NONE Performed at [facility], [address] [place], [place] [address]    Culture   Final    NO GROWTH Performed at [facility], [address] [place], [place] [address]    Report Status 09/25/2020 FINAL  Final  Culture, Respiratory w Gram Stain     Status: None   Collection Time: 09/25/20  7:50 AM   Specimen: Tracheal Aspirate; Respiratory  Result Value Ref Range Status   Specimen Description   Final    TRACHEAL ASPIRATE Performed at [facility], [address], [place], [place] [address]    Special Requests   Final    NONE Performed at [facility], [address] [place], [place] [address]    Gram Stain   Final    RARE WBC PRESENT,BOTH PMN AND MONONUCLEAR NO ORGANISMS SEEN Performed at [facility], [address] [place], [place] [address]    Culture RARE CANDIDA TROPICALIS  Final   Report Status 09/27/2020 FINAL  Final  CSF culture w Gram Stain     Status: None (Preliminary result)   Collection Time:  09/27/20 10:34 AM   Specimen: PATH Cytology CSF; Cerebrospinal Fluid  Result Value Ref Range Status   Specimen Description   Final    CSF Performed at [facility], [address] [place], [place] [address]    Special Requests   Final    NONE Performed at [facility], [address] [place], [place] [address]    Gram Stain   Final    NO ORGANISMS SEEN WBC SEEN RED BLOOD CELLS PRESENT    Culture   Final    NO GROWTH 2 DAYS Performed at [facility], [address] [place], [place] [address]    Report Status PENDING  Incomplete  Fungus Culture With Stain     Status: None (Preliminary result)   Collection Time: 09/27/20 10:34 AM   Specimen: PATH Cytology CSF; Cerebrospinal Fluid  Result Value Ref Range Status   Fungus Stain Final report  Final    Comment: (NOTE) Performed At: [facility] [address] [place], [place] [id number] Jolene Schimke MD DG:[id number]    Fungus (Mycology) Culture PENDING  Incomplete   Fungal Source CSF  Final    Comment: Performed at [facility], [address] [place], [place] [address]  Anaerobic culture w Gram Stain     Status: None (Preliminary result)   Collection Time: 09/27/20 10:34 AM   Specimen: PATH Cytology CSF; Cerebrospinal Fluid  Result Value Ref Range Status   Specimen Description   Final    CSF Performed at [facility], [address] [place], [place] [address]    Special Requests   Final    NONE Performed at [facility], [address] [place], [place] [address]    Gram Stain   Final    CYTOSPIN SMEAR NO WBC SEEN NO ORGANISMS SEEN Performed at [facility], [address], [place], [place] [address]  Culture   Final    NO ANAEROBES ISOLATED; CULTURE IN PROGRESS FOR 5 DAYS   Report Status PENDING  Incomplete  Blood culture (routine x 2)     Status: None (Preliminary result)   Collection Time: 09/29/20  8:20 PM   Specimen: Right  Antecubital; Blood  Result Value Ref Range Status   Specimen Description RIGHT ANTECUBITAL  Final   Special Requests   Final    BOTTLES DRAWN AEROBIC AND ANAEROBIC Blood Culture adequate volume   Culture   Final    NO GROWTH < 12 HOURS Performed at [facility], [address], [place], [place] [address]    Report Status PENDING  Incomplete  Resp Panel by RT-PCR (Flu A&B, Covid) Nasopharyngeal Swab     Status: None   Collection Time: 09/29/20 10:17 PM   Specimen: Nasopharyngeal Swab; Nasopharyngeal(NP) swabs in vial transport medium  Result Value Ref Range Status   SARS Coronavirus 2 by RT PCR NEGATIVE NEGATIVE Final    Comment: (NOTE) SARS-CoV-2 target nucleic acids are NOT DETECTED.  The SARS-CoV-2 RNA is generally detectable in upper respiratory specimens during the acute phase of infection. The lowest concentration of SARS-CoV-2 viral copies this assay can detect is 138 copies/mL. A negative result does not preclude SARS-Cov-2 infection and should not be used as the sole basis for treatment or other patient management decisions. A negative result may occur with  improper specimen collection/handling, submission of specimen other than nasopharyngeal swab, presence of viral mutation(s) within the areas targeted by this assay, and inadequate number of viral copies(<138 copies/mL). A negative result must be combined with clinical observations, patient history, and epidemiological information. The expected result is Negative.  Fact Sheet for Patients:  BloggerCourse.com  Fact Sheet for Healthcare Providers:  SeriousBroker.it  This test is no t yet approved or cleared by the [place] FDA and  has been authorized for detection and/or diagnosis of SARS-CoV-2 by FDA under an Emergency Use Authorization (EUA). This EUA will remain  in effect (meaning this test can be used) for the duration of the COVID-19 declaration under  Section 564(b)(1) of the Act, 21 U.S.C.section 360bbb-3(b)(1), unless the authorization is terminated  or revoked sooner.       Influenza A by PCR NEGATIVE NEGATIVE Final   Influenza B by PCR NEGATIVE NEGATIVE Final    Comment: (NOTE) The Xpert Xpress SARS-CoV-2/FLU/RSV plus assay is intended as an aid in the diagnosis of influenza from Nasopharyngeal swab specimens and should not be used as a sole basis for treatment. Nasal washings and aspirates are unacceptable for Xpert Xpress SARS-CoV-2/FLU/RSV testing.  Fact Sheet for Patients: BloggerCourse.com  Fact Sheet for Healthcare Providers: SeriousBroker.it  This test is not yet approved or cleared by the [place] FDA and has been authorized for detection and/or diagnosis of SARS-CoV-2 by FDA under an Emergency Use Authorization (EUA). This EUA will remain in effect (meaning this test can be used) for the duration of the COVID-19 declaration under Section 564(b)(1) of the Act, 21 U.S.C. section 360bbb-3(b)(1), unless the authorization is terminated or revoked.  Performed at [facility], [address] [place], [place] [address]   MRSA PCR Screening     Status: None   Collection Time: 09/30/20  1:20 AM   Specimen: Nasal Mucosa; Nasopharyngeal  Result Value Ref Range Status   MRSA by PCR NEGATIVE NEGATIVE Final    Comment:        The GeneXpert MRSA Assay (FDA approved  for NASAL specimens only), is one component of a comprehensive MRSA colonization surveillance program. It is not intended to diagnose MRSA infection nor to guide or monitor treatment for MRSA infections. Performed at [facility], [address] [place], [place] [address]      Radiology Studies: DG Chest Port 1 View  Result Date: 09/29/2020 CLINICAL DATA:  Possible sepsis EXAM: PORTABLE CHEST 1 VIEW COMPARISON:  09/26/2020, CT 11/13/2015 FINDINGS: Extensive surgical hardware in the  cervicothoracic spine. Post sternotomy changes and valve prosthesis. Interval extubation and removal of esophageal tubes. Trace left-sided pleural effusion, decreased compared to prior. Improved aeration bilaterally with clearing of previously noted airspace disease. Stable cardiomediastinal silhouette. No pneumothorax IMPRESSION: Improved aeration bilaterally with resolution of previously noted airspace opacities. Small left-sided pleural effusion, decreased compared to prior Electronically Signed   By: Jasmine Pang M.D.   On: 09/29/2020 20:33    Pamella Pert, MD, PhD [facility]  Between 7 am - 7 pm I am available, please contact me via Amion (for emergencies) or Securechat (non urgent messages)  Between 7 pm - 7 am I am not available, please contact night coverage MD/APP via Amion

## 2020-09-30 NOTE — Progress Notes (Signed)
Unable to get accurate B/P at this time because every time I cycle a pressure she pulls the B/P cuff off.

## 2020-10-01 DIAGNOSIS — E876 Hypokalemia: Secondary | ICD-10-CM

## 2020-10-01 DIAGNOSIS — D638 Anemia in other chronic diseases classified elsewhere: Secondary | ICD-10-CM

## 2020-10-01 LAB — CBC WITH DIFFERENTIAL/PLATELET
Abs Immature Granulocytes: 0.5 10*3/uL — ABNORMAL HIGH (ref 0.00–0.07)
Basophils Absolute: 0 10*3/uL (ref 0.0–0.1)
Basophils Relative: 0 %
Eosinophils Absolute: 0.7 10*3/uL — ABNORMAL HIGH (ref 0.0–0.5)
Eosinophils Relative: 3 %
HCT: 26.4 % — ABNORMAL LOW (ref 36.0–46.0)
Hemoglobin: 8.8 g/dL — ABNORMAL LOW (ref 12.0–15.0)
Lymphocytes Relative: 14 %
Lymphs Abs: 3.3 10*3/uL (ref 0.7–4.0)
MCH: 26.9 pg (ref 26.0–34.0)
MCHC: 33.3 g/dL (ref 30.0–36.0)
MCV: 80.7 fL (ref 80.0–100.0)
Metamyelocytes Relative: 1 %
Monocytes Absolute: 0.5 10*3/uL (ref 0.1–1.0)
Monocytes Relative: 2 %
Myelocytes: 1 %
Neutro Abs: 18.6 10*3/uL — ABNORMAL HIGH (ref 1.7–7.7)
Neutrophils Relative %: 79 %
Platelets: 214 10*3/uL (ref 150–400)
RBC: 3.27 MIL/uL — ABNORMAL LOW (ref 3.87–5.11)
RDW: 15.2 % (ref 11.5–15.5)
WBC: 23.6 10*3/uL — ABNORMAL HIGH (ref 4.0–10.5)
nRBC: 0 % (ref 0.0–0.2)
nRBC: 0 /100 WBC

## 2020-10-01 LAB — BASIC METABOLIC PANEL
Anion gap: 12 (ref 5–15)
BUN: 8 mg/dL (ref 6–20)
CO2: 28 mmol/L (ref 22–32)
Calcium: 8 mg/dL — ABNORMAL LOW (ref 8.9–10.3)
Chloride: 95 mmol/L — ABNORMAL LOW (ref 98–111)
Creatinine, Ser: 0.5 mg/dL (ref 0.44–1.00)
GFR, Estimated: >60 mL/min (ref >60)
Glucose, Bld: 85 mg/dL (ref 70–99)
Potassium: 2.5 mmol/L — CL (ref 3.5–5.1)
Sodium: 135 mmol/L (ref 135–145)

## 2020-10-01 LAB — VDRL, CSF: VDRL Quant, CSF: NONREACTIVE

## 2020-10-01 LAB — MAGNESIUM: Magnesium: 1.6 mg/dL — ABNORMAL LOW (ref 1.7–2.4)

## 2020-10-01 MED ORDER — POTASSIUM CHLORIDE CRYS ER 20 MEQ PO TBCR
40.0000 meq | EXTENDED_RELEASE_TABLET | Freq: Once | ORAL | Status: AC
  Administered 2020-10-01: 40 meq via ORAL
  Filled 2020-10-01: qty 2

## 2020-10-01 MED ORDER — POTASSIUM CHLORIDE 20 MEQ PO PACK
40.0000 meq | PACK | Freq: Once | ORAL | Status: AC
  Administered 2020-10-01: 40 meq via ORAL
  Filled 2020-10-01: qty 2

## 2020-10-01 MED ORDER — MAGNESIUM SULFATE 2 GM/50ML IV SOLN
2.0000 g | Freq: Once | INTRAVENOUS | Status: AC
  Administered 2020-10-01: 2 g via INTRAVENOUS
  Filled 2020-10-01: qty 50

## 2020-10-01 MED ORDER — VANCOMYCIN HCL 750 MG/150ML IV SOLN
750.0000 mg | Freq: Two times a day (BID) | INTRAVENOUS | Status: DC
  Administered 2020-10-01 – 2020-10-03 (×5): 750 mg via INTRAVENOUS
  Filled 2020-10-01 (×6): qty 150

## 2020-10-01 MED ORDER — POTASSIUM CHLORIDE CRYS ER 20 MEQ PO TBCR
40.0000 meq | EXTENDED_RELEASE_TABLET | Freq: Once | ORAL | Status: DC
  Filled 2020-10-01: qty 2

## 2020-10-01 NOTE — Progress Notes (Signed)
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  On 5/24 she had respiratory failure and had to be intubated, eventually extubated 5/29 and then when she woke up she decided to leave AMA and apparently came straight [facility].  ID was consulted  Subjective / 24h Interval events: No complaints this morning, a bit sleepy but wakes up easily.  No significant complaints, feels the withdrawals but she is better with buprenorphine  Assessment & Plan: Principal Problem Tricuspid valve endocarditis, sepsis, POA-initially admitted at [facility] but left AMA on 5/29.  She underwent a TEE on 5/26 which showed EF 60-65%, normal RV, and evidence of tricuspid valve endocarditis with a 1 cm vegetation, and mild to moderate tricuspid valve regurgitation. -Infectious disease consulted, appreciate input.  Current cultures are negative but she does have a history of MRSA/Serratia bacteremia.  Due to altered mental status she underwent an LP 5/27 which was unremarkable without concerns for CNS infection. -Continue vancomycin, cefepime, gentamicin and rifampin per ID -Monitor WBC and fever curve.  CT of her cervical, thoracic, lumbar spine done on 5/25 without acute abnormalities.  Low threshold to reimage with MRI if she has worsening localized pain  Active Problems Anemia of chronic disease-hemoglobin stable at 8.8, monitor  Multifocal pneumonia-possibly with septic emboli, chest x-ray on admission shows improvement.  Continue antibiotics  Hypokalemia -ongoing  aggressive repletion needed, 2.5 this morning.  Replete magnesium also  Polysubstance abuse/IVDU -patient very concerned about withdrawals.  She prefers buprenorphine versus methadone.  Started 2 mg of buprenorphine yesterday, was not enough so dose increased to 4  Liver cirrhosis, history of hep C -patient unaware of this diagnosis.  Monitor  Goals of care-palliative consulted [facility] at [facility], remains full code/full scope.  [facility]  Scheduled Meds: . buprenorphine  4 mg Sublingual Daily  . potassium chloride  40 mEq Oral Once  . rifampin  300 mg Oral Q8H   Continuous Infusions: . ceFEPime (MAXIPIME) IV 200 mL/hr at 10/01/20 0700  . gentamicin 100 mL/hr at 10/01/20 0700  . promethazine (PHENERGAN) injection (IM or IVPB) 12.5 mg (09/30/20 1806)  . vancomycin     PRN Meds:.acetaminophen **OR** acetaminophen, promethazine (PHENERGAN) injection (IM or IVPB)  Diet Orders (From admission, onward)    Start     Ordered   09/29/20 2059  Diet Heart Room service appropriate? Yes; Fluid consistency: Thin  Diet effective now       Question Answer Comment  Room service appropriate? Yes   Fluid consistency: Thin      09/29/20 2102          DVT prophylaxis: SCDs Start: 09/29/20 2058     Code Status: Full Code  Family Communication: no family at bedside   Status is: Inpatient because: Persistent severe electrolyte disturbances, IV treatments appropriate due to intensity of illness or inability to take PO and Inpatient level of care appropriate due to severity of illness  Dispo: The patient is from: Home  Anticipated d/c is to: Home              Patient currently is not medically stable to d/c.   Difficult to place patient No  Level of care: Telemetry Medical  Consultants:  ID  Procedures:  none  Microbiology  Blood cultures -pending  Antimicrobials: Vancomycin Gentamicin Rifampin Cefepime   Objective: Vitals:    10/01/20 0300 10/01/20 0400 10/01/20 0743 10/01/20 0801  BP: 97/83 117/64 97/83 118/66  Pulse: 86 83 (!) 104   Resp: (!) 29 (!) 22 (!) 27   Temp: 98.5 F (36.9 C)  99.4 F (37.4 C)   TempSrc: Oral  Oral   SpO2: 90% 96% 99%   Weight:      Height:        Intake/Output Summary (Last 24 hours) at 10/01/2020 0945 Last data filed at 10/01/2020 0700 Gross per 24 hour  Intake 2667.69 ml  Output 4825 ml  Net -2157.31 ml   Filed Weights   09/30/20 0015  Weight: 64.1 kg    Examination:  Constitutional: She is in no distress Eyes: No icterus ENMT: Moist mucous membranes Neck: normal, supple Respiratory: Diminished at the bases, no wheezing or crackles, tachypneic Cardiovascular: Regular rate and rhythm, no murmurs, trace edema Abdomen: Soft, minimally tender to palpation without guarding or rebound Musculoskeletal: no clubbing / cyanosis.  Skin: No new rashes Neurologic: No focal deficits  Data Reviewed: I have independently reviewed following labs and imaging studies   CBC: Recent Labs  Lab 09/26/20 0525 09/27/20 0500 09/28/20 0439 09/29/20 0533 09/29/20 1106 09/29/20 1843 10/01/20 0116  WBC 16.6* 19.8* 14.1* 12.2*  --  24.3* 23.6*  NEUTROABS 13.6* 16.9* 10.1*  --   --  19.4* 18.6*  HGB 8.6* 8.5* 7.6* 7.0* 7.1* 8.4* 8.8*  HCT 25.8* 25.4* 24.0* 22.0* 22.9* 26.9* 26.4*  MCV 77.9* 78.6* 82.2 82.1  --  82.3 80.7  PLT 66* 69* 65* 119*  --  206 214   Basic Metabolic Panel: Recent Labs  Lab 09/25/20 0413 09/26/20 0525 09/27/20 0500 09/28/20 0439 09/29/20 0533 09/29/20 1843 10/01/20 0116  NA 140 145 146* 148* 146* 144 135  K 3.9 3.4* 3.2* 3.5 3.3* 2.4* 2.5*  CL 111 116* 118* 121* 120* 116* 95*  CO2 19* 21* 23 22 21* 21* 28  GLUCOSE 125* 153* 142* 139* 147* 95 85  BUN 27* 24* 23* 24* 22* 15 8  CREATININE 0.93 0.83 0.69 0.74 0.62 0.65 0.50  CALCIUM 7.1* 7.8* 7.7* 7.5* 7.2* 8.0* 8.0*  MG 2.6* 2.9* 2.6* 2.4 2.3  --  1.6*  PHOS 4.0 2.4* 2.6 3.8 3.5  --   --     Liver Function Tests: Recent Labs  Lab 09/25/20 0413 09/26/20 0525 09/27/20 0500 09/28/20 0439 09/29/20 1843  AST 71* 18 18 20 23   ALT 31 25 21 19 21   ALKPHOS 58 63 63 48 48  BILITOT 0.8 0.6 0.9 0.9 1.0  PROT 5.7* 6.1* 5.8* 5.4* 6.0*  ALBUMIN 2.5* 2.5* 2.3* 2.2* 2.5*   Coagulation Profile: Recent Labs  Lab 09/24/20 1003  INR 1.4*   HbA1C: No results for input(s): HGBA1C in the last 72 hours. CBG: Recent Labs  Lab 09/28/20 1944 09/28/20 2336 09/29/20 0400 09/29/20 0728 09/29/20 1116  GLUCAP 131* 134* 124* 137* 98    Recent Results (from the past 240 hour(s))  Resp Panel by RT-PCR (Flu A&B, Covid) Nasopharyngeal Swab     Status: None   Collection Time: 09/23/20  6:16  AM   Specimen: Nasopharyngeal Swab; Nasopharyngeal(NP) swabs in vial transport medium  Result Value Ref Range Status   SARS Coronavirus 2 by RT PCR NEGATIVE NEGATIVE Final    Comment: (NOTE) SARS-CoV-2 target nucleic acids are NOT DETECTED.  The SARS-CoV-2 RNA is generally detectable in upper respiratory specimens during the acute phase of infection. The lowest concentration of SARS-CoV-2 viral copies this assay can detect is 138 copies/mL. A negative result does not preclude SARS-Cov-2 infection and should not be used as the sole basis for treatment or other patient management decisions. A negative result may occur with  improper specimen collection/handling, submission of specimen other than nasopharyngeal swab, presence of viral mutation(s) within the areas targeted by this assay, and inadequate number of viral copies(<138 copies/mL). A negative result must be combined with clinical observations, patient history, and epidemiological information. The expected result is Negative.  Fact Sheet for Patients:  BloggerCourse.com  Fact Sheet for Healthcare Providers:  SeriousBroker.it  This test is no t yet approved or cleared by the [place]  FDA and  has been authorized for detection and/or diagnosis of SARS-CoV-2 by FDA under an Emergency Use Authorization (EUA). This EUA will remain  in effect (meaning this test can be used) for the duration of the COVID-19 declaration under Section 564(b)(1) of the Act, 21 U.S.C.section 360bbb-3(b)(1), unless the authorization is terminated  or revoked sooner.       Influenza A by PCR NEGATIVE NEGATIVE Final   Influenza B by PCR NEGATIVE NEGATIVE Final    Comment: (NOTE) The Xpert Xpress SARS-CoV-2/FLU/RSV plus assay is intended as an aid in the diagnosis of influenza from Nasopharyngeal swab specimens and should not be used as a sole basis for treatment. Nasal washings and aspirates are unacceptable for Xpert Xpress SARS-CoV-2/FLU/RSV testing.  Fact Sheet for Patients: BloggerCourse.com  Fact Sheet for Healthcare Providers: SeriousBroker.it  This test is not yet approved or cleared by the [place] FDA and has been authorized for detection and/or diagnosis of SARS-CoV-2 by FDA under an Emergency Use Authorization (EUA). This EUA will remain in effect (meaning this test can be used) for the duration of the COVID-19 declaration under Section 564(b)(1) of the Act, 21 U.S.C. section 360bbb-3(b)(1), unless the authorization is terminated or revoked.  Performed at [facility], [address] [place], [place] [address]   Culture, blood (x 2)     Status: None   Collection Time: 09/23/20  6:54 AM   Specimen: BLOOD  Result Value Ref Range Status   Specimen Description BLOOD RIGHT FA  Final   Special Requests   Final    BOTTLES DRAWN AEROBIC ONLY Blood Culture adequate volume   Culture   Final    NO GROWTH 5 DAYS Performed at [facility], [address] [place], [place] [address]    Report Status 09/28/2020 FINAL  Final  MRSA PCR Screening     Status: None   Collection Time: 09/23/20  9:33 AM    Specimen: Nasal Mucosa; Nasopharyngeal  Result Value Ref Range Status   MRSA by PCR NEGATIVE NEGATIVE Final    Comment:        The GeneXpert MRSA Assay (FDA approved for NASAL specimens only), is one component of a comprehensive MRSA colonization surveillance program. It is not intended to diagnose MRSA infection nor to guide or monitor treatment for MRSA infections. Performed at [facility], [address] [place], [place] [address]   Urine Culture     Status:  None   Collection Time: 09/24/20 12:51 AM   Specimen: Urine, Random  Result Value Ref Range Status   Specimen Description   Final    URINE, RANDOM Performed at [facility], [address], [place], [place] [address]    Special Requests   Final    NONE Performed at [facility], [address], [place], [place] [address]    Culture   Final    NO GROWTH Performed at [facility], [address], [place], [place] [address]    Report Status 09/25/2020 FINAL  Final  Culture, Respiratory w Gram Stain     Status: None   Collection Time: 09/25/20  7:50 AM   Specimen: Tracheal Aspirate; Respiratory  Result Value Ref Range Status   Specimen Description   Final    TRACHEAL ASPIRATE Performed at [facility], [address], [place], [place] [address]    Special Requests   Final    NONE Performed at [facility], [address] [place], [place] [address]    Gram Stain   Final    RARE WBC PRESENT,BOTH PMN AND MONONUCLEAR NO ORGANISMS SEEN Performed at [facility], [address] [place], [place] [address]    Culture RARE CANDIDA TROPICALIS  Final   Report Status 09/27/2020 FINAL  Final  CSF culture w Gram Stain     Status: None   Collection Time: 09/27/20 10:34 AM   Specimen: PATH Cytology CSF; Cerebrospinal Fluid  Result Value Ref Range Status   Specimen Description   Final    CSF Performed at [facility], [address],  [place], [place] [address]    Special Requests   Final    NONE Performed at [facility], [address] [place], [place] [address]    Gram Stain   Final    NO ORGANISMS SEEN WBC SEEN RED BLOOD CELLS PRESENT    Culture   Final    NO GROWTH 3 DAYS Performed at [facility], [address] [place], [place] [address]    Report Status 09/30/2020 FINAL  Final  Fungus Culture With Stain     Status: None (Preliminary result)   Collection Time: 09/27/20 10:34 AM   Specimen: PATH Cytology CSF; Cerebrospinal Fluid  Result Value Ref Range Status   Fungus Stain Final report  Final    Comment: (NOTE) Performed At: [facility] [address] [place], [place] [id number] Jolene Schimke MD ZD:[id number]    Fungus (Mycology) Culture PENDING  Incomplete   Fungal Source CSF  Final    Comment: Performed at [facility], [address] [place], [place] [address]  Anaerobic culture w Gram Stain     Status: None (Preliminary result)   Collection Time: 09/27/20 10:34 AM   Specimen: PATH Cytology CSF; Cerebrospinal Fluid  Result Value Ref Range Status   Specimen Description   Final    CSF Performed at [facility], [address] [place], [place] [address]    Special Requests   Final    NONE Performed at [facility], [address] [place], [place] [address]    Gram Stain   Final    CYTOSPIN SMEAR NO WBC SEEN NO ORGANISMS SEEN Performed at [facility], [address] [place], [place] [address]    Culture   Final    NO ANAEROBES ISOLATED; CULTURE IN PROGRESS FOR 5 DAYS   Report Status PENDING  Incomplete  Fungus Culture Result     Status: None   Collection Time: 09/27/20 10:34 AM  Result Value Ref Range Status   Result 1 Comment  Final    Comment: (NOTE) KOH/Calcofluor preparation:  no fungus observed. Performed At: [facility] [address] [place], [place] [id number] Jolene Schimke MD YW:[id number]   Blood  culture (routine x 2)     Status: None (Preliminary result)   Collection Time: 09/29/20  8:20 PM   Specimen: Right Antecubital; Blood  Result Value Ref Range Status   Specimen Description RIGHT ANTECUBITAL  Final   Special Requests   Final    BOTTLES DRAWN AEROBIC AND ANAEROBIC Blood Culture adequate volume   Culture   Final    NO GROWTH 2 DAYS Performed at [facility], [address], [place], [place] [address]    Report Status PENDING  Incomplete  Resp Panel by RT-PCR (Flu A&B, Covid) Nasopharyngeal Swab     Status: None   Collection Time: 09/29/20 10:17 PM   Specimen: Nasopharyngeal Swab; Nasopharyngeal(NP) swabs in vial transport medium  Result Value Ref Range Status   SARS Coronavirus 2 by RT PCR NEGATIVE NEGATIVE Final    Comment: (NOTE) SARS-CoV-2 target nucleic acids are NOT DETECTED.  The SARS-CoV-2 RNA is generally detectable in upper respiratory specimens during the acute phase of infection. The lowest concentration of SARS-CoV-2 viral copies this assay can detect is 138 copies/mL. A negative result does not preclude SARS-Cov-2 infection and should not be used as the sole basis for treatment or other patient management decisions. A negative result may occur with  improper specimen collection/handling, submission of specimen other than nasopharyngeal swab, presence of viral mutation(s) within the areas targeted by this assay, and inadequate number of viral copies(<138 copies/mL). A negative result must be combined with clinical observations, patient history, and epidemiological information. The expected result is Negative.  Fact Sheet for Patients:  BloggerCourse.com  Fact Sheet for Healthcare Providers:  SeriousBroker.it  This test is no t yet approved or cleared by the [place] FDA and  has been authorized for detection and/or diagnosis of SARS-CoV-2 by FDA under an Emergency Use Authorization (EUA).  This EUA will remain  in effect (meaning this test can be used) for the duration of the COVID-19 declaration under Section 564(b)(1) of the Act, 21 U.S.C.section 360bbb-3(b)(1), unless the authorization is terminated  or revoked sooner.       Influenza A by PCR NEGATIVE NEGATIVE Final   Influenza B by PCR NEGATIVE NEGATIVE Final    Comment: (NOTE) The Xpert Xpress SARS-CoV-2/FLU/RSV plus assay is intended as an aid in the diagnosis of influenza from Nasopharyngeal swab specimens and should not be used as a sole basis for treatment. Nasal washings and aspirates are unacceptable for Xpert Xpress SARS-CoV-2/FLU/RSV testing.  Fact Sheet for Patients: BloggerCourse.com  Fact Sheet for Healthcare Providers: SeriousBroker.it  This test is not yet approved or cleared by the [place] FDA and has been authorized for detection and/or diagnosis of SARS-CoV-2 by FDA under an Emergency Use Authorization (EUA). This EUA will remain in effect (meaning this test can be used) for the duration of the COVID-19 declaration under Section 564(b)(1) of the Act, 21 U.S.C. section 360bbb-3(b)(1), unless the authorization is terminated or revoked.  Performed at [facility], [address], [place], [place] [address]   MRSA PCR Screening     Status: None   Collection Time: 09/30/20  1:20 AM   Specimen:  Nasal Mucosa; Nasopharyngeal  Result Value Ref Range Status   MRSA by PCR NEGATIVE NEGATIVE Final    Comment:        The GeneXpert MRSA Assay (FDA approved for NASAL specimens only), is one component of a comprehensive MRSA colonization surveillance program. It is not intended to diagnose MRSA infection nor to guide or monitor treatment for MRSA infections. Performed at [facility], [address], [place], [place] [address]   Blood culture (routine x 2)     Status: None (Preliminary result)   Collection Time: 09/30/20  3:17 AM    Specimen: BLOOD  Result Value Ref Range Status   Specimen Description BLOOD SITE NOT SPECIFIED  Final   Special Requests   Final    BOTTLES DRAWN AEROBIC ONLY Blood Culture results may not be optimal due to an inadequate volume of blood received in culture bottles   Culture   Final    NO GROWTH 1 DAY Performed at [facility], [address], [place], [place] [address]    Report Status PENDING  Incomplete     Radiology Studies: No results found.  Pamella Pert, MD, PhD [facility]  Between 7 am - 7 pm I am available, please contact me via Amion (for emergencies) or Securechat (non urgent messages)  Between 7 pm - 7 am I am not available, please contact night coverage MD/APP via Amion

## 2020-10-01 NOTE — Plan of Care (Signed)

## 2020-10-01 NOTE — Discharge Summary (Signed)
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  Pregnancy test came back negative. given 1 L bolus of IV lactated Ringer, 4 mg IV Zofran twice, 1 g of IV Toradol and IV Zosyn  09/27/20- patient s/p LP today.  Continuing tx for menigntis with acyclovir and antibiotics.   09/29/20 -PATIENT SIGNED OUT FROM HOSPITAL AGAINST MEDICAL ADVICE    Micro Data:  COVID NEG HIV NEG INF A/B NEG 4 years ago MRSA bacteremia 08/2019-1 year ago MSSA bacteremia and TRICUSPID VALVE ENDOCARDITIS 08/2018 SPINAL INFECTIONS  Antibiotics Given (last 72 hours)    Date/Time Action Medication Dose Rate   09/28/20 1708 New Bag/Given   gentamicin (GARAMYCIN) 50 mg in dextrose 5 % 50 mL IVPB 50 mg 102.5 mL/hr   09/28/20 2317 New Bag/Given   ceFEPIme (MAXIPIME) 2 g in sodium chloride 0.9 % 100 mL IVPB 2 g 200 mL/hr   09/28/20 2326 New Bag/Given   rifampin (RIFADIN) 300 mg in sodium chloride 0.9 % 100 mL IVPB 300 mg 200 mL/hr   09/29/20 0236 New Bag/Given   vancomycin (VANCOREADY) IVPB 750 mg/150 mL 750 mg 150 mL/hr   09/29/20 0512 New Bag/Given   ceFEPIme (MAXIPIME) 2 g in sodium chloride 0.9 % 100 mL IVPB 2 g 200 mL/hr   09/29/20 0519 New Bag/Given   rifampin (RIFADIN) 300 mg in sodium chloride 0.9 % 100 mL IVPB 300 mg 200 mL/hr   09/29/20 5462 New Bag/Given   gentamicin (GARAMYCIN) 50 mg in dextrose 5 % 50 mL IVPB 50 mg 102.5 mL/hr   09/29/20 1315 New Bag/Given   ceFEPIme (MAXIPIME) 2 g in sodium chloride 0.9 % 100 mL IVPB 2 g 200 mL/hr        Interim  History / Subjective:  Remains intubated Severe septic shock     Objective   Blood pressure 121/70, pulse 80, temperature 98.42 F (36.9 C), resp. rate (!) 22, height 5' (1.524 m), weight 48.7 kg, SpO2 96 %.       No intake or output data in the 24 hours ending 10/01/20 1622 Filed Weights   09/23/20 0032 09/23/20 0845  Weight: 54.4 kg 48.7 kg      REVIEW OF SYSTEMS  PATIENT IS UNABLE TO PROVIDE COMPLETE REVIEW OF SYSTEMS DUE TO SEVERE CRITICAL ILLNESS AND TOXIC METABOLIC ENCEPHALOPATHY   PHYSICAL EXAMINATION:  GENERAL:critically ill appearing, +resp distress HEAD: Normocephalic, atraumatic.  EYES: Pupils equal, round, reactive to light.  No scleral icterus.  MOUTH: Moist mucosal membrane. NECK: Supple. PULMONARY: +rhonchi CARDIOVASCULAR: S1 and S2. Regular rate and rhythm. No murmurs, rubs, or gallops.  GASTROINTESTINAL: Soft, nontender, -distended. Positive bowel sounds.  MUSCULOSKELETAL: No swelling, clubbing, or edema.  NEUROLOGIC: obtunded SKIN:intact,warm,dry     ASSESSMENT AND PLAN SYNOPSIS   45 yo white female with multiple medical issues with severe sepsis with high fevers with metabolic acidosis with severe hypoxic respiratory failure with severe toxic metabolic encephalopathy leading to severe acute decompensation with very high risk for cardiac arrest, patient emergently intubated, I ma concerned for septic emboli and or severe infections encephalitis  Severe ACUTE Hypoxic and Hypercapnic Respiratory Failure -continue Mechanical Ventilator support -continue Bronchodilator Therapy -Wean Fio2 and PEEP as tolerated -VAP/VENT bundle implementation  Acute ASTHMA EXACERBATION -continue IV steroids as prescribed -continue NEB THERAPY as prescribed -morphine as needed -wean fio2 as needed and tolerated         -ketamine for sedation due to 3 sedatives gtt with hypotension   CARDIAC FAILURE-h/o endocarditis Will need TEE -follow up cardiac  enzymes as indicated -EF is normal   CARDIAC ICU monitoring    NEUROLOGY Acute toxic metabolic encephalopathy, need for sedation Goal RASS -2 to -3 Await NEURO recs CT spine and head no acute process      INFECTIOUS DISEASE h/o MSSA endocarditis -continue antibiotics as prescribed -follow up cultures Will need MRI brain, CT chest -continue antibiotics as prescribed -follow up cultures -follow up ID consultation Need to consider LP, NEUROLOGY CONSULTED Will empirically treat for infectious encephalitis   ENDO - ICU hypoglycemic\Hyperglycemia protocol -check FSBS per protocol   GI GI PROPHYLAXIS as indicated  NUTRITIONAL STATUS DIET-->TF's as tolerated Constipation protocol as indicated   ELECTROLYTES -follow labs as needed -replace as needed -pharmacy consultation and following    Best practice (right click and "Reselect all SmartList Selections" daily)  Diet:NPO Pain/Anxiety/Delirium protocol (if indicated):Yes(RASS goal -2) VAP protocol (if indicated):Yes DVT prophylaxis:Contraindicated GI prophylaxis:H2B Glucose control:SSIYes Central venous access:Yes, and it is still needed Foley:N/A Mobility:B ED REST Code Status:FULL Disposition:ICU      Labs   CBC: Recent Labs  Lab 09/26/20 0525 09/27/20 0500 09/28/20 0439 09/29/20 0533 09/29/20 1106 09/29/20 1843 10/01/20 0116  WBC 16.6* 19.8* 14.1* 12.2*  --  24.3* 23.6*  NEUTROABS 13.6* 16.9* 10.1*  --   --  19.4* 18.6*  HGB 8.6* 8.5* 7.6* 7.0* 7.1* 8.4* 8.8*  HCT 25.8* 25.4* 24.0* 22.0* 22.9* 26.9* 26.4*  MCV 77.9* 78.6* 82.2 82.1  --  82.3 80.7  PLT 66* 69* 65* 119*  --  206 214    Basic Metabolic Panel: Recent Labs  Lab 09/25/20 0413 09/26/20 0525 09/27/20 0500 09/28/20 0439 09/29/20 0533 09/29/20 1843 10/01/20 0116  NA 140 145 146* 148* 146* 144 135  K 3.9 3.4* 3.2* 3.5 3.3* 2.4* 2.5*  CL 111 116* 118* 121* 120* 116* 95*  CO2 19* 21* 23 22 21* 21* 28   GLUCOSE 125* 153* 142* 139* 147* 95 85  BUN 27* 24* 23* 24* 22* 15 8  CREATININE 0.93 0.83 0.69 0.74 0.62 0.65 0.50  CALCIUM 7.1* 7.8* 7.7* 7.5* 7.2* 8.0* 8.0*  MG 2.6* 2.9* 2.6* 2.4 2.3  --  1.6*  PHOS 4.0 2.4* 2.6 3.8 3.5  --   --    GFR: Estimated Creatinine Clearance: 63.1 mL/min (by C-G formula based on SCr of 0.5 mg/dL). Recent Labs  Lab 09/24/20 2021 09/24/20 2243 09/25/20 0413 09/25/20 0413 09/26/20 0525 09/27/20 0500 09/28/20 0439 09/29/20 0533 09/29/20 1842 09/29/20 1843 10/01/20 0116  PROCALCITON  --   --  >150.00  --  >150.00  --   --   --   --   --   --   WBC  --   --  22.8*   < > 16.6*   < > 14.1* 12.2*  --  24.3* 23.6*  LATICACIDVEN 2.0* 2.2*  --   --   --   --   --   --  1.5  --   --    < > = values in this interval not displayed.  Liver Function Tests: Recent Labs  Lab 09/25/20 0413 09/26/20 0525 09/27/20 0500 09/28/20 0439 09/29/20 1843  AST 71* 18 18 20 23   ALT 31 25 21 19 21   ALKPHOS 58 63 63 48 48  BILITOT 0.8 0.6 0.9 0.9 1.0  PROT 5.7* 6.1* 5.8* 5.4* 6.0*  ALBUMIN 2.5* 2.5* 2.3* 2.2* 2.5*   Recent Labs  Lab 09/29/20 1843  LIPASE 44   No results for input(s): AMMONIA in the last 168 hours.  ABG    Component Value Date/Time   PHART 7.31 (L) 09/25/2020 0446   PCO2ART 36 09/25/2020 0446   PO2ART 111 (H) 09/25/2020 0446   HCO3 18.2 (L) 09/25/2020 0446   ACIDBASEDEF 7.6 (H) 09/25/2020 0446   O2SAT 98.1 09/25/2020 0446     Coagulation Profile: No results for input(s): INR, PROTIME in the last 168 hours.  Cardiac Enzymes: No results for input(s): CKTOTAL, CKMB, CKMBINDEX, TROPONINI in the last 168 hours.  HbA1C: No results found for: HGBA1C  CBG: Recent Labs  Lab 09/28/20 1944 09/28/20 2336 09/29/20 0400 09/29/20 0728 09/29/20 1116  GLUCAP 131* 134* 124* 137* 98    Allergies No Known Allergies     DVT/GI PRX  assessed I Assessed the need for Labs I Assessed the need for Foley I Assessed the need for Central  Venous Line Family Discussion when available I Assessed the need for Mobilization I made an Assessment of medications to be adjusted accordingly Safety Risk assessment completed  CASE DISCUSSED IN MULTIDISCIPLINARY ROUNDS WITH ICU TEAM     Critical Care Time devoted to patient care services described in this note is 33  minutes.  Critical care was necessary to treat or prevent imminent or life-threatening deterioration. Overall, patient is critically ill, prognosis is guarded.  Patient with Multiorgan failure and at high risk for cardiac arrest and death.     10/01/20, M.D.  Pulmonary & Critical Care Medicine  [facility]

## 2020-10-02 ENCOUNTER — Inpatient Hospital Stay: Payer: Self-pay

## 2020-10-02 LAB — COMPREHENSIVE METABOLIC PANEL
ALT: 17 U/L (ref 0–44)
AST: 19 U/L (ref 15–41)
Albumin: 2.7 g/dL — ABNORMAL LOW (ref 3.5–5.0)
Alkaline Phosphatase: 53 U/L (ref 38–126)
Anion gap: 11 (ref 5–15)
BUN: 10 mg/dL (ref 6–20)
CO2: 25 mmol/L (ref 22–32)
Calcium: 8 mg/dL — ABNORMAL LOW (ref 8.9–10.3)
Chloride: 98 mmol/L (ref 98–111)
Creatinine, Ser: 0.66 mg/dL (ref 0.44–1.00)
GFR, Estimated: >60 mL/min (ref >60)
Glucose, Bld: 92 mg/dL (ref 70–99)
Potassium: 2.8 mmol/L — ABNORMAL LOW (ref 3.5–5.1)
Sodium: 134 mmol/L — ABNORMAL LOW (ref 135–145)
Total Bilirubin: 1.3 mg/dL — ABNORMAL HIGH (ref 0.3–1.2)
Total Protein: 6.4 g/dL — ABNORMAL LOW (ref 6.5–8.1)

## 2020-10-02 LAB — VANCOMYCIN, PEAK: Vancomycin Pk: 37 ug/mL (ref 30–40)

## 2020-10-02 LAB — ANAEROBIC CULTURE W GRAM STAIN

## 2020-10-02 MED ORDER — SODIUM CHLORIDE 0.9% FLUSH
10.0000 mL | INTRAVENOUS | Status: DC | PRN
Start: 2020-10-02 — End: 2020-10-12

## 2020-10-02 MED ORDER — HYDROXYZINE HCL 10 MG PO TABS
10.0000 mg | ORAL_TABLET | Freq: Three times a day (TID) | ORAL | Status: DC | PRN
  Administered 2020-10-02 – 2020-10-06 (×5): 10 mg via ORAL
  Filled 2020-10-02 (×6): qty 1

## 2020-10-02 MED ORDER — BUPRENORPHINE HCL 2 MG SL SUBL
8.0000 mg | SUBLINGUAL_TABLET | Freq: Every day | SUBLINGUAL | Status: DC
  Administered 2020-10-03 – 2020-10-12 (×10): 8 mg via SUBLINGUAL
  Filled 2020-10-02 (×10): qty 4

## 2020-10-02 MED ORDER — SODIUM CHLORIDE 0.9% FLUSH
10.0000 mL | Freq: Two times a day (BID) | INTRAVENOUS | Status: DC
  Administered 2020-10-02 – 2020-10-11 (×12): 10 mL

## 2020-10-02 MED ORDER — CHLORHEXIDINE GLUCONATE CLOTH 2 % EX PADS
6.0000 | MEDICATED_PAD | Freq: Every day | CUTANEOUS | Status: DC
  Administered 2020-10-02 – 2020-10-11 (×8): 6 via TOPICAL

## 2020-10-02 NOTE — Progress Notes (Signed)
RN went to patient room for initial assessment.  Brother sitting in chair, shirt off and a pair of mens underwear in his hand.  Informed pt and brother visiting hours were over and that brother would need to leave for night.  Patient and brother state that he was told he could stay because he is her emotional support person.  Explained that we do not have overnight visitors at this time and that he could return at 7am..  While in room discussing medications with patient, he is answering questions for patient that are not about the conversation we were having.  House coverage made aware of visitation issue.  Patient brother did leave unit without any issues.

## 2020-10-02 NOTE — Plan of Care (Signed)
  Problem: Education: Goal: Knowledge of General Education information will improve Description: Including pain rating scale, medication(s)/side effects and non-pharmacologic comfort measures Outcome: Progressing   Problem: Clinical Measurements: Goal: Diagnostic test results will improve Outcome: Progressing Goal: Cardiovascular complication will be avoided Outcome: Progressing   Problem: Nutrition: Goal: Adequate nutrition will be maintained Outcome: Progressing   Problem: Coping: Goal: Level of anxiety will decrease Outcome: Progressing   Problem: Elimination: Goal: Will not experience complications related to urinary retention Outcome: Progressing   Problem: Skin Integrity: Goal: Risk for impaired skin integrity will decrease Outcome: Progressing

## 2020-10-02 NOTE — Progress Notes (Signed)
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  On 5/24 she had respiratory failure and had to be intubated, eventually extubated 5/29 and then when she woke up she decided to leave AMA and apparently came straight to [facility], ER.  ID was consulted.  Subjective / 24h Interval events: Lab having trouble drawing blood, limited IV access  Assessment & Plan:  Tricuspid valve endocarditis, sepsis, POA -initially admitted at [facility] regional but left AMA on 5/29. - underwent a TEE on 5/26 which showed EF 60-65%, normal RV, and evidence of tricuspid valve endocarditis with a 1 cm vegetation, and mild to moderate tricuspid valve regurgitation. -Infectious disease consulted, appreciate input.  Current cultures are negative but she does have a history of MRSA/Serratia bacteremia.  Due to altered mental status she underwent an LP 5/27 which was unremarkable without concerns for CNS infection. -Continue abx per ID -Monitor WBC and fever curve.  CT of her cervical, thoracic, lumbar spine done on 5/25 without acute abnormalities.  Low threshold to reimage with MRI if she has worsening localized pain  Anemia of chronic disease -hemoglobin stable  Multifocal pneumonia-possibly with septic emboli, chest x-ray on admission shows improvement.  Continue antibiotics  Hypokalemia  -ongoing aggressive repletion needed -BMP this AM pending due to inability to draw labs  Polysubstance abuse/IVDU -patient very concerned about withdrawals.  She prefers buprenorphine versus  methadone.   Liver cirrhosis, history of hep C -patient unaware of this diagnosis.  Monitor  Goals of care-palliative consulted while at [facility], remains full code/full scope.  No need to [facility] palliative at this point  Scheduled Meds: . buprenorphine  4 mg Sublingual Daily  . potassium chloride  40 mEq Oral Once  . rifampin  300 mg Oral Q8H   Continuous Infusions: . ceFEPime (MAXIPIME) IV Stopped (10/02/20 0552)  . gentamicin 60 mg (10/02/20 1038)  . promethazine (PHENERGAN) injection (IM or IVPB) Stopped (10/01/20 2143)  . vancomycin 750 mg (10/02/20 0640)   PRN Meds:.acetaminophen **OR** acetaminophen, promethazine (PHENERGAN) injection (IM or IVPB)  Diet Orders (From admission, onward)    Start     Ordered   09/29/20 2059  Diet Heart Room service appropriate? Yes; Fluid consistency: Thin  Diet effective now       Question Answer Comment  Room service appropriate? Yes   Fluid consistency: Thin      09/29/20 2102          DVT prophylaxis: SCDs Start: 09/29/20 2058     Code Status: Full Code  Family Communication: no family at bedside   Status is: Inpatient because: Persistent severe electrolyte disturbances, IV treatments appropriate due to intensity of illness or inability to take PO and Inpatient level of care appropriate due to severity of illness  Dispo: The patient is from: Home              Anticipated d/c is to: Home              Patient currently is not medically stable to d/c.   Difficult to place patient No  Level of care:  Telemetry Medical  Consultants:  ID    Objective: Vitals:   10/02/20 0320 10/02/20 0400 10/02/20 0821 10/02/20 1204  BP: 109/68 113/61 (!) 106/55 110/66  Pulse:      Resp: 18 20 20 16   Temp: 98.2 F (36.8 C)  98.8 F (37.1 C) 98.4 F (36.9 C)  TempSrc: Oral  Oral Oral  SpO2: 96%     Weight:      Height:        Intake/Output Summary (Last 24 hours) at 10/02/2020 1226 Last data filed at 10/02/2020 0900 Gross per 24  hour  Intake 1033.43 ml  Output 1050 ml  Net -16.57 ml   Filed Weights   09/30/20 0015  Weight: 64.1 kg    Examination:   General: Appearance:     Overweight female in no acute distress     Lungs:     respirations unlabored  Heart:    Tachycardic.   MS:   All extremities are intact.   Neurologic:   Awake, alert, tearful at times    Data Reviewed: I have independently reviewed following labs and imaging studies   CBC: Recent Labs  Lab 09/26/20 0525 09/27/20 0500 09/28/20 0439 09/29/20 0533 09/29/20 1106 09/29/20 1843 10/01/20 0116  WBC 16.6* 19.8* 14.1* 12.2*  --  24.3* 23.6*  NEUTROABS 13.6* 16.9* 10.1*  --   --  19.4* 18.6*  HGB 8.6* 8.5* 7.6* 7.0* 7.1* 8.4* 8.8*  HCT 25.8* 25.4* 24.0* 22.0* 22.9* 26.9* 26.4*  MCV 77.9* 78.6* 82.2 82.1  --  82.3 80.7  PLT 66* 69* 65* 119*  --  206 214   Basic Metabolic Panel: Recent Labs  Lab 09/26/20 0525 09/27/20 0500 09/28/20 0439 09/29/20 0533 09/29/20 1843 10/01/20 0116  NA 145 146* 148* 146* 144 135  K 3.4* 3.2* 3.5 3.3* 2.4* 2.5*  CL 116* 118* 121* 120* 116* 95*  CO2 21* 23 22 21* 21* 28  GLUCOSE 153* 142* 139* 147* 95 85  BUN 24* 23* 24* 22* 15 8  CREATININE 0.83 0.69 0.74 0.62 0.65 0.50  CALCIUM 7.8* 7.7* 7.5* 7.2* 8.0* 8.0*  MG 2.9* 2.6* 2.4 2.3  --  1.6*  PHOS 2.4* 2.6 3.8 3.5  --   --    Liver Function Tests: Recent Labs  Lab 09/26/20 0525 09/27/20 0500 09/28/20 0439 09/29/20 1843  AST 18 18 20 23   ALT 25 21 19 21   ALKPHOS 63 63 48 48  BILITOT 0.6 0.9 0.9 1.0  PROT 6.1* 5.8* 5.4* 6.0*  ALBUMIN 2.5* 2.3* 2.2* 2.5*   Coagulation Profile: No results for input(s): INR, PROTIME in the last 168 hours. HbA1C: No results for input(s): HGBA1C in the last 72 hours. CBG: Recent Labs  Lab 09/28/20 1944 09/28/20 2336 09/29/20 0400 09/29/20 0728 09/29/20 1116  GLUCAP 131* 134* 124* 137* 98    Recent Results (from the past 240 hour(s))  Resp Panel by RT-PCR (Flu A&B, Covid) Nasopharyngeal Swab      Status: None   Collection Time: 09/23/20  6:16 AM   Specimen: Nasopharyngeal Swab; Nasopharyngeal(NP) swabs in vial transport medium  Result Value Ref Range Status   SARS Coronavirus 2 by RT PCR NEGATIVE NEGATIVE Final    Comment: (NOTE) SARS-CoV-2 target nucleic acids are NOT DETECTED.  The SARS-CoV-2 RNA is generally detectable in upper respiratory specimens during the acute phase of infection. The lowest concentration of SARS-CoV-2 viral copies this assay can detect is 138 copies/mL. A negative result does not preclude SARS-Cov-2 infection  and should not be used as the sole basis for treatment or other patient management decisions. A negative result may occur with  improper specimen collection/handling, submission of specimen other than nasopharyngeal swab, presence of viral mutation(s) within the areas targeted by this assay, and inadequate number of viral copies(<138 copies/mL). A negative result must be combined with clinical observations, patient history, and epidemiological information. The expected result is Negative.  Fact Sheet for Patients:  BloggerCourse.com  Fact Sheet for Healthcare Providers:  SeriousBroker.it  This test is no t yet approved or cleared by the [place] FDA and  has been authorized for detection and/or diagnosis of SARS-CoV-2 by FDA under an Emergency Use Authorization (EUA). This EUA will remain  in effect (meaning this test can be used) for the duration of the COVID-19 declaration under Section 564(b)(1) of the Act, 21 U.S.C.section 360bbb-3(b)(1), unless the authorization is terminated  or revoked sooner.       Influenza A by PCR NEGATIVE NEGATIVE Final   Influenza B by PCR NEGATIVE NEGATIVE Final    Comment: (NOTE) The Xpert Xpress SARS-CoV-2/FLU/RSV plus assay is intended as an aid in the diagnosis of influenza from Nasopharyngeal swab specimens and should not be used as a sole basis  for treatment. Nasal washings and aspirates are unacceptable for Xpert Xpress SARS-CoV-2/FLU/RSV testing.  Fact Sheet for Patients: BloggerCourse.com  Fact Sheet for Healthcare Providers: SeriousBroker.it  This test is not yet approved or cleared by the [place] FDA and has been authorized for detection and/or diagnosis of SARS-CoV-2 by FDA under an Emergency Use Authorization (EUA). This EUA will remain in effect (meaning this test can be used) for the duration of the COVID-19 declaration under Section 564(b)(1) of the Act, 21 U.S.C. section 360bbb-3(b)(1), unless the authorization is terminated or revoked.  Performed at [facility], [address] [place], [place] [address]   Culture, blood (x 2)     Status: None   Collection Time: 09/23/20  6:54 AM   Specimen: BLOOD  Result Value Ref Range Status   Specimen Description BLOOD RIGHT FA  Final   Special Requests   Final    BOTTLES DRAWN AEROBIC ONLY Blood Culture adequate volume   Culture   Final    NO GROWTH 5 DAYS Performed at [facility], [address] [place], [place] [address]    Report Status 09/28/2020 FINAL  Final  MRSA PCR Screening     Status: None   Collection Time: 09/23/20  9:33 AM   Specimen: Nasal Mucosa; Nasopharyngeal  Result Value Ref Range Status   MRSA by PCR NEGATIVE NEGATIVE Final    Comment:        The GeneXpert MRSA Assay (FDA approved for NASAL specimens only), is one component of a comprehensive MRSA colonization surveillance program. It is not intended to diagnose MRSA infection nor to guide or monitor treatment for MRSA infections. Performed at [facility], [address], [place], [place] [address]   Urine Culture     Status: None   Collection Time: 09/24/20 12:51 AM   Specimen: Urine, Random  Result Value Ref Range Status   Specimen Description   Final    URINE, RANDOM Performed at [facility], [address] [place], [place] [address]    Special Requests   Final    NONE Performed at [facility], [address], [place], [place] [address]    Culture   Final    NO GROWTH Performed  at [facility], [address] [place], [place] [address]    Report Status 09/25/2020 FINAL  Final  Culture, Respiratory w Gram Stain     Status: None   Collection Time: 09/25/20  7:50 AM   Specimen: Tracheal Aspirate; Respiratory  Result Value Ref Range Status   Specimen Description   Final    TRACHEAL ASPIRATE Performed at [facility], [address] [place], [place] [address]    Special Requests   Final    NONE Performed at [facility], [address] [place], [place] [address]    Gram Stain   Final    RARE WBC PRESENT,BOTH PMN AND MONONUCLEAR NO ORGANISMS SEEN Performed at [facility], [address] [place], [place] [address]    Culture RARE CANDIDA TROPICALIS  Final   Report Status 09/27/2020 FINAL  Final  CSF culture w Gram Stain     Status: None   Collection Time: 09/27/20 10:34 AM   Specimen: PATH Cytology CSF; Cerebrospinal Fluid  Result Value Ref Range Status   Specimen Description   Final    CSF Performed at [facility], [address] [place], [place] [address]    Special Requests   Final    NONE Performed at [facility], [address] [place], [place] [address]    Gram Stain   Final    NO ORGANISMS SEEN WBC SEEN RED BLOOD CELLS PRESENT    Culture   Final    NO GROWTH 3 DAYS Performed at [facility], [address] [place], [place] [address]    Report Status 09/30/2020 FINAL  Final  Fungus Culture With Stain     Status: None (Preliminary result)   Collection Time: 09/27/20 10:34 AM   Specimen: PATH Cytology CSF; Cerebrospinal Fluid  Result Value Ref Range Status   Fungus Stain Final report  Final    Comment: (NOTE) Performed At: [facility] [address] [place], [place] [id number] Jolene SchimkeNagendra Sanjai MD [id number]:[id number]:[phone]    Fungus (Mycology) Culture PENDING  Incomplete   Fungal Source CSF  Final    Comment: Performed at [facility], [address] [place], [place] [address]  Anaerobic culture w Gram Stain     Status: None (Preliminary result)   Collection Time: 09/27/20 10:34 AM   Specimen: PATH Cytology CSF; Cerebrospinal Fluid  Result Value Ref Range Status   Specimen Description   Final    CSF Performed at [facility], [address] [place], [place] [address]    Special Requests   Final    NONE Performed at [facility], [address] [place], [place] [address]    Gram Stain   Final    CYTOSPIN SMEAR NO WBC SEEN NO ORGANISMS SEEN Performed at [facility], [address] [place], [place] [address]    Culture   Final    NO ANAEROBES ISOLATED; CULTURE IN PROGRESS FOR 5 DAYS   Report Status PENDING  Incomplete  Fungus Culture Result     Status: None   Collection Time: 09/27/20 10:34 AM  Result Value Ref Range Status   Result 1 Comment  Final    Comment: (NOTE) KOH/Calcofluor preparation:  no fungus observed. Performed At: [facility] [address] [place], [place] [id number] Jolene SchimkeNagendra Sanjai MD ZY:[id number]:[phone]   Blood culture (routine x 2)     Status: None (Preliminary result)   Collection Time: 09/29/20  8:20  PM   Specimen: Right Antecubital; Blood  Result Value Ref Range Status   Specimen Description RIGHT ANTECUBITAL  Final   Special Requests   Final    BOTTLES DRAWN AEROBIC AND ANAEROBIC Blood Culture adequate volume   Culture   Final    NO GROWTH 3 DAYS Performed at [facility], [address] [place], [place] [address]    Report Status PENDING  Incomplete  Resp Panel by RT-PCR (Flu A&B, Covid) Nasopharyngeal Swab     Status: None   Collection Time: 09/29/20 10:17 PM   Specimen: Nasopharyngeal Swab; Nasopharyngeal(NP) swabs in vial transport medium  Result  Value Ref Range Status   SARS Coronavirus 2 by RT PCR NEGATIVE NEGATIVE Final    Comment: (NOTE) SARS-CoV-2 target nucleic acids are NOT DETECTED.  The SARS-CoV-2 RNA is generally detectable in upper respiratory specimens during the acute phase of infection. The lowest concentration of SARS-CoV-2 viral copies this assay can detect is 138 copies/mL. A negative result does not preclude SARS-Cov-2 infection and should not be used as the sole basis for treatment or other patient management decisions. A negative result may occur with  improper specimen collection/handling, submission of specimen other than nasopharyngeal swab, presence of viral mutation(s) within the areas targeted by this assay, and inadequate number of viral copies(<138 copies/mL). A negative result must be combined with clinical observations, patient history, and epidemiological information. The expected result is Negative.  Fact Sheet for Patients:  BloggerCourse.com  Fact Sheet for Healthcare Providers:  SeriousBroker.it  This test is no t yet approved or cleared by the [place] FDA and  has been authorized for detection and/or diagnosis of SARS-CoV-2 by FDA under an Emergency Use Authorization (EUA). This EUA will remain  in effect (meaning this test can be used) for the duration of the COVID-19 declaration under Section 564(b)(1) of the Act, 21 U.S.C.section 360bbb-3(b)(1), unless the authorization is terminated  or revoked sooner.       Influenza A by PCR NEGATIVE NEGATIVE Final   Influenza B by PCR NEGATIVE NEGATIVE Final    Comment: (NOTE) The Xpert Xpress SARS-CoV-2/FLU/RSV plus assay is intended as an aid in the diagnosis of influenza from Nasopharyngeal swab specimens and should not be used as a sole basis for treatment. Nasal washings and aspirates are unacceptable for Xpert Xpress SARS-CoV-2/FLU/RSV testing.  Fact Sheet for  Patients: BloggerCourse.com  Fact Sheet for Healthcare Providers: SeriousBroker.it  This test is not yet approved or cleared by the [place] FDA and has been authorized for detection and/or diagnosis of SARS-CoV-2 by FDA under an Emergency Use Authorization (EUA). This EUA will remain in effect (meaning this test can be used) for the duration of the COVID-19 declaration under Section 564(b)(1) of the Act, 21 U.S.C. section 360bbb-3(b)(1), unless the authorization is terminated or revoked.  Performed at [facility], [address] [place], [place] [address]   MRSA PCR Screening     Status: None   Collection Time: 09/30/20  1:20 AM   Specimen: Nasal Mucosa; Nasopharyngeal  Result Value Ref Range Status   MRSA by PCR NEGATIVE NEGATIVE Final    Comment:        The GeneXpert MRSA Assay (FDA approved for NASAL specimens only), is one component of a comprehensive MRSA colonization surveillance program. It is not intended to diagnose MRSA infection nor to guide or monitor treatment for MRSA infections. Performed at [facility], [address] [place], [place] [address]  Blood culture (routine x 2)     Status: None (Preliminary result)   Collection Time: 09/30/20  3:17 AM   Specimen: BLOOD  Result Value Ref Range Status   Specimen Description BLOOD SITE NOT SPECIFIED  Final   Special Requests   Final    BOTTLES DRAWN AEROBIC ONLY Blood Culture results may not be optimal due to an inadequate volume of blood received in culture bottles   Culture   Final    NO GROWTH 2 DAYS Performed at [facility], [address] [place], [place] [address]    Report Status PENDING  Incomplete     Radiology Studies: [place] EKG SITE RITE  Result Date: 10/02/2020 If Site Rite image not attached, placement could not be confirmed due to current cardiac rhythm.   Marlin Canary DO [facility]  Between 7 am - 7 pm I  am available, please contact me via Amion (for emergencies) or Securechat (non urgent messages)  Between 7 pm - 7 am I am not available, please contact night coverage MD/APP via Amion

## 2020-10-02 NOTE — Progress Notes (Signed)
Peripherally Inserted Central Catheter Placement  The IV Nurse has discussed with the patient and/or persons authorized to consent for the patient, the purpose of this procedure and the potential benefits and risks involved with this procedure.  The benefits include less needle sticks, lab draws from the catheter, and the patient may be discharged home with the catheter. Risks include, but not limited to, infection, bleeding, blood clot (thrombus formation), and puncture of an artery; nerve damage and irregular heartbeat and possibility to perform a PICC exchange if needed/ordered by physician.  Alternatives to this procedure were also discussed.  Bard Power PICC patient education guide, fact sheet on infection prevention and patient information card has been provided to patient /or left at bedside.    PICC Placement Documentation  PICC Single Lumen 10/02/20 Left Brachial 39 cm 2 cm (Active)  Indication for Insertion or Continuance of Line Poor Vasculature-patient has had multiple peripheral attempts or PIVs lasting less than 24 hours 10/02/20 1400  Exposed Catheter (cm) 2 cm 10/02/20 1400  Site Assessment Clean;Dry;Intact 10/02/20 1400  Line Status Flushed;Saline locked;Blood return noted 10/02/20 1400  Dressing Type Transparent;Securing device 10/02/20 1400  Dressing Status Clean;Dry;Intact 10/02/20 1400  Antimicrobial disc in place? Yes 10/02/20 1400  Safety Lock Not Applicable 10/02/20 1400  Line Care Connections checked and tightened 10/02/20 1400  Dressing Intervention New dressing 10/02/20 1400  Dressing Change Due 10/09/20 10/02/20 1400       Franne Grip Renee 10/02/2020, 2:52 PM

## 2020-10-02 NOTE — Plan of Care (Signed)
  Problem: Health Behavior/Discharge Planning: Goal: Ability to manage health-related needs will improve Outcome: Progressing   Problem: Clinical Measurements: Goal: Ability to maintain clinical measurements within normal limits will improve Outcome: Progressing Goal: Will remain free from infection Outcome: Progressing Goal: Diagnostic test results will improve Outcome: Progressing Goal: Respiratory complications will improve Outcome: Progressing   

## 2020-10-02 NOTE — Progress Notes (Signed)
Okay to insert PICC per DR Judyann Munson ID.

## 2020-10-03 LAB — COMPREHENSIVE METABOLIC PANEL
ALT: 13 U/L (ref 0–44)
AST: 14 U/L — ABNORMAL LOW (ref 15–41)
Albumin: 2.7 g/dL — ABNORMAL LOW (ref 3.5–5.0)
Alkaline Phosphatase: 56 U/L (ref 38–126)
Anion gap: 8 (ref 5–15)
BUN: 7 mg/dL (ref 6–20)
CO2: 23 mmol/L (ref 22–32)
Calcium: 8.1 mg/dL — ABNORMAL LOW (ref 8.9–10.3)
Chloride: 103 mmol/L (ref 98–111)
Creatinine, Ser: 0.7 mg/dL (ref 0.44–1.00)
GFR, Estimated: >60 mL/min (ref >60)
Glucose, Bld: 92 mg/dL (ref 70–99)
Potassium: 2.8 mmol/L — ABNORMAL LOW (ref 3.5–5.1)
Sodium: 134 mmol/L — ABNORMAL LOW (ref 135–145)
Total Bilirubin: 0.8 mg/dL (ref 0.3–1.2)
Total Protein: 6.4 g/dL — ABNORMAL LOW (ref 6.5–8.1)

## 2020-10-03 LAB — CBC WITH DIFFERENTIAL/PLATELET
Abs Immature Granulocytes: 0.27 10*3/uL — ABNORMAL HIGH (ref 0.00–0.07)
Basophils Absolute: 0 10*3/uL (ref 0.0–0.1)
Basophils Relative: 0 %
Eosinophils Absolute: 0.2 10*3/uL (ref 0.0–0.5)
Eosinophils Relative: 1 %
HCT: 25.8 % — ABNORMAL LOW (ref 36.0–46.0)
Hemoglobin: 8.3 g/dL — ABNORMAL LOW (ref 12.0–15.0)
Immature Granulocytes: 1 %
Lymphocytes Relative: 8 %
Lymphs Abs: 1.5 10*3/uL (ref 0.7–4.0)
MCH: 26.6 pg (ref 26.0–34.0)
MCHC: 32.2 g/dL (ref 30.0–36.0)
MCV: 82.7 fL (ref 80.0–100.0)
Monocytes Absolute: 1.4 10*3/uL — ABNORMAL HIGH (ref 0.1–1.0)
Monocytes Relative: 7 %
Neutro Abs: 16.7 10*3/uL — ABNORMAL HIGH (ref 1.7–7.7)
Neutrophils Relative %: 83 %
Platelets: 294 10*3/uL (ref 150–400)
RBC: 3.12 MIL/uL — ABNORMAL LOW (ref 3.87–5.11)
RDW: 16.2 % — ABNORMAL HIGH (ref 11.5–15.5)
WBC: 20.1 10*3/uL — ABNORMAL HIGH (ref 4.0–10.5)
nRBC: 0 % (ref 0.0–0.2)

## 2020-10-03 LAB — VANCOMYCIN, TROUGH: Vancomycin Tr: 15 ug/mL (ref 15–20)

## 2020-10-03 LAB — OLIGOCLONAL BANDS, CSF + SERM

## 2020-10-03 LAB — RAPID URINE DRUG SCREEN, HOSP PERFORMED
Amphetamines: NOT DETECTED
Barbiturates: NOT DETECTED
Benzodiazepines: NOT DETECTED
Cocaine: NOT DETECTED
Opiates: NOT DETECTED
Tetrahydrocannabinol: NOT DETECTED

## 2020-10-03 MED ORDER — POTASSIUM CHLORIDE 10 MEQ/50ML IV SOLN: 10.0000 meq | INTRAVENOUS | Status: DC

## 2020-10-03 MED ORDER — ALUM & MAG HYDROXIDE-SIMETH 200-200-20 MG/5ML PO SUSP
30.0000 mL | ORAL | Status: DC | PRN
  Administered 2020-10-03 – 2020-10-04 (×3): 30 mL via ORAL
  Filled 2020-10-03 (×3): qty 30

## 2020-10-03 MED ORDER — POTASSIUM CHLORIDE 10 MEQ/50ML IV SOLN
10.0000 meq | Freq: Once | INTRAVENOUS | Status: AC
  Administered 2020-10-03: 10 meq via INTRAVENOUS
  Filled 2020-10-03: qty 50

## 2020-10-03 MED ORDER — TRAZODONE HCL 50 MG PO TABS
50.0000 mg | ORAL_TABLET | Freq: Every day | ORAL | Status: DC
  Administered 2020-10-07 – 2020-10-11 (×4): 50 mg via ORAL
  Filled 2020-10-03 (×9): qty 1

## 2020-10-03 MED ORDER — POTASSIUM CHLORIDE 10 MEQ/50ML IV SOLN
10.0000 meq | INTRAVENOUS | Status: AC
  Administered 2020-10-03 (×4): 10 meq via INTRAVENOUS
  Filled 2020-10-03 (×4): qty 50

## 2020-10-03 MED ORDER — CLONIDINE HCL 0.1 MG PO TABS
0.1000 mg | ORAL_TABLET | Freq: Two times a day (BID) | ORAL | Status: DC
  Administered 2020-10-03 – 2020-10-05 (×6): 0.1 mg via ORAL
  Filled 2020-10-03 (×6): qty 1

## 2020-10-03 MED ORDER — MAGNESIUM SULFATE 2 GM/50ML IV SOLN
2.0000 g | Freq: Once | INTRAVENOUS | Status: AC
  Administered 2020-10-03: 2 g via INTRAVENOUS
  Filled 2020-10-03: qty 50

## 2020-10-03 NOTE — Progress Notes (Signed)
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  On 5/24 she had respiratory failure and had to be intubated, eventually extubated 5/29 and then when she woke up she decided to leave AMA and apparently came straight to [facility], ER.  ID was consulted.  Subjective / 24h Interval events: Asking for benzos  Assessment & Plan:  Tricuspid valve endocarditis, sepsis, POA -initially admitted at [facility] but left AMA on 5/29. - underwent a TEE on 5/26 which showed EF 60-65%, normal RV, and evidence of tricuspid valve endocarditis with a 1 cm vegetation, and mild to moderate tricuspid valve regurgitation. -Infectious disease consulted, appreciate input.  Current cultures are negative but she does have a history of MRSA/Serratia bacteremia.  Due to altered mental status she underwent an LP 5/27 which was unremarkable without concerns for CNS infection. -Continue abx per ID -Monitor WBC and fever curve.  CT of her cervical, thoracic, lumbar spine done on 5/25 without acute abnormalities.  Low threshold to reimage with MRI if she has worsening localized pain  Anemia of chronic disease -hemoglobin stable  Multifocal pneumonia-possibly with septic emboli, chest x-ray on admission shows improvement.  Continue antibiotics  Hypokalemia/hypomagnesemia -ongoing aggressive repletion needed  Polysubstance abuse/IVDU -patient very concerned about withdrawals.  She prefers buprenorphine versus methadone.  -adjust as able -tele sitter as there were reports of erratic  behavior by family -could use prn clonidine for withdrawal as well  Liver cirrhosis, history of hep C  -outpatient follow up  Goals of care-palliative consulted while at [facility], remains full code/full scope.  No need to [facility] palliative at this point  Scheduled Meds: . buprenorphine  8 mg Sublingual Daily  . Chlorhexidine Gluconate Cloth  6 each Topical Daily  . rifampin  300 mg Oral Q8H  . sodium chloride flush  10-40 mL Intracatheter Q12H  . traZODone  50 mg Oral QHS   Continuous Infusions: . ceFEPime (MAXIPIME) IV 2 g (10/03/20 6644)  . gentamicin 60 mg (10/03/20 0038)  . magnesium sulfate bolus IVPB 2 g (10/03/20 1021)  . potassium chloride    . promethazine (PHENERGAN) injection (IM or IVPB) 12.5 mg (10/03/20 0917)  . vancomycin 750 mg (10/03/20 0746)   PRN Meds:.acetaminophen **OR** acetaminophen, hydrOXYzine, promethazine (PHENERGAN) injection (IM or IVPB), sodium chloride flush  Diet Orders (From admission, onward)    Start     Ordered   09/29/20 2059  Diet Heart Room service appropriate? Yes; Fluid consistency: Thin  Diet effective now       Question Answer Comment  Room service appropriate? Yes   Fluid consistency: Thin      09/29/20 2102          DVT prophylaxis: SCDs Start: 09/29/20 2058     Code Status: Full Code  Family Communication: brother at bedside who immediately left when I walked into room  Status is: Inpatient because: Persistent severe electrolyte disturbances, IV treatments appropriate due to intensity of illness or inability to take PO and Inpatient level of care appropriate due to severity of illness  Dispo: The patient is from: Home  Anticipated d/c is to: Home              Patient currently is not medically stable to d/c.   Difficult to place patient No  Level of care: Telemetry Medical  Consultants:  ID    Objective: Vitals:   10/02/20 2340 10/03/20 0000 10/03/20 0321 10/03/20 0818  BP: 115/61  118/63 (!)  153/78  Pulse: (!) 114 96 92 (!) 106  Resp: 16 18 20    Temp: 98.5 F (36.9 C)  98.7 F (37.1 C) 99.2 F (37.3 C)  TempSrc: Oral  Oral Oral  SpO2: 96% 96% 96% 100%  Weight:      Height:        Intake/Output Summary (Last 24 hours) at 10/03/2020 1037 Last data filed at 10/02/2020 1235 Gross per 24 hour  Intake 120 ml  Output 300 ml  Net -180 ml   Filed Weights   09/30/20 0015  Weight: 64.1 kg    Examination:   General: Appearance:     Overweight female in no acute distress     Lungs:     Clear to auscultation bilaterally, respirations unlabored  Heart:    Tachycardic.   MS:   All extremities are intact.   Neurologic:   Awake, alert, oriented x 3   Data Reviewed: I have independently reviewed following labs and imaging studies   CBC: Recent Labs  Lab 09/27/20 0500 09/28/20 0439 09/29/20 0533 09/29/20 1106 09/29/20 1843 10/01/20 0116 10/03/20 0610  WBC 19.8* 14.1* 12.2*  --  24.3* 23.6* 20.1*  NEUTROABS 16.9* 10.1*  --   --  19.4* 18.6* 16.7*  HGB 8.5* 7.6* 7.0* 7.1* 8.4* 8.8* 8.3*  HCT 25.4* 24.0* 22.0* 22.9* 26.9* 26.4* 25.8*  MCV 78.6* 82.2 82.1  --  82.3 80.7 82.7  PLT 69* 65* 119*  --  206 214 294   Basic Metabolic Panel: Recent Labs  Lab 09/27/20 0500 09/28/20 0439 09/29/20 0533 09/29/20 1843 10/01/20 0116 10/02/20 2115 10/03/20 0610  NA 146* 148* 146* 144 135 134* 134*  K 3.2* 3.5 3.3* 2.4* 2.5* 2.8* 2.8*  CL 118* 121* 120* 116* 95* 98 103  CO2 23 22 21* 21* 28 25 23   GLUCOSE 142* 139* 147* 95 85 92 92  BUN 23* 24* 22* 15 8 10 7   CREATININE 0.69 0.74 0.62 0.65 0.50 0.66 0.70  CALCIUM 7.7* 7.5* 7.2* 8.0* 8.0* 8.0* 8.1*  MG 2.6* 2.4 2.3  --  1.6*  --   --   PHOS 2.6 3.8 3.5  --   --   --   --    Liver Function Tests: Recent Labs  Lab 09/27/20 0500 09/28/20 0439 09/29/20 1843 10/02/20 2115 10/03/20 0610  AST 18 20 23 19  14*  ALT 21 19 21 17 13   ALKPHOS 63 48 48 53 56  BILITOT 0.9 0.9 1.0 1.3* 0.8  PROT 5.8* 5.4* 6.0* 6.4* 6.4*   ALBUMIN 2.3* 2.2* 2.5* 2.7* 2.7*   Coagulation Profile: No results for input(s): INR, PROTIME in the last 168 hours. HbA1C: No results for input(s): HGBA1C in the last 72 hours. CBG: Recent Labs  Lab 09/28/20 1944 09/28/20 2336 09/29/20 0400 09/29/20 0728 09/29/20 1116  GLUCAP 131* 134* 124* 137* 98    Recent Results (from the past 240 hour(s))  Urine Culture     Status: None   Collection Time: 09/24/20 12:51 AM   Specimen: Urine, Random  Result Value Ref Range Status   Specimen Description  Final    URINE, RANDOM Performed at [facility], [address] [place], [place] [address]    Special Requests   Final    NONE Performed at [facility], [address] [place], [place] [address]    Culture   Final    NO GROWTH Performed at [facility], [address] [place], [place] [address]    Report Status 09/25/2020 FINAL  Final  Culture, Respiratory w Gram Stain     Status: None   Collection Time: 09/25/20  7:50 AM   Specimen: Tracheal Aspirate; Respiratory  Result Value Ref Range Status   Specimen Description   Final    TRACHEAL ASPIRATE Performed at [facility], [address] [place], [place] [address]    Special Requests   Final    NONE Performed at [facility], [address] [place], [place] [address]    Gram Stain   Final    RARE WBC PRESENT,BOTH PMN AND MONONUCLEAR NO ORGANISMS SEEN Performed at [facility], [address] [place], [place] [address]    Culture RARE CANDIDA TROPICALIS  Final   Report Status 09/27/2020 FINAL  Final  CSF culture w Gram Stain     Status: None   Collection Time: 09/27/20 10:34 AM   Specimen: PATH Cytology CSF; Cerebrospinal Fluid  Result Value Ref Range Status   Specimen Description   Final    CSF Performed at [facility], [address] [place], [place] [address]    Special Requests   Final    NONE Performed at [facility], [address] [place], [place] [address]    Gram Stain   Final    NO ORGANISMS SEEN WBC SEEN RED BLOOD CELLS PRESENT    Culture   Final    NO GROWTH 3 DAYS Performed at [facility], [address] [place], [place] [address]    Report Status 09/30/2020 FINAL  Final  Fungus Culture With Stain     Status: None (Preliminary result)   Collection Time: 09/27/20 10:34 AM   Specimen: PATH Cytology CSF; Cerebrospinal Fluid  Result Value Ref Range Status   Fungus Stain Final report  Final    Comment: (NOTE) Performed At: [facility] [address] [place], [place] [id number] Jolene SchimkeNagendra Sanjai MD ZD:[id number]:[phone]    Fungus (Mycology) Culture PENDING  Incomplete   Fungal Source CSF  Final    Comment: Performed at [facility], [address] [place], [place] [address]  Anaerobic culture w Gram Stain     Status: None   Collection Time: 09/27/20 10:34 AM   Specimen: PATH Cytology CSF; Cerebrospinal Fluid  Result Value Ref Range Status   Specimen Description   Final    CSF Performed at [facility], [address] [place], [place] [address]    Special Requests   Final    NONE Performed at [facility], [address] [place], [place] [address]    Gram Stain CYTOSPIN SMEAR NO WBC SEEN NO ORGANISMS SEEN   Final   Culture   Final    NO ANAEROBES ISOLATED Performed at [facility], [address] [place], [place] [address]    Report Status 10/02/2020 FINAL  Final  Fungus Culture Result     Status: None   Collection Time: 09/27/20 10:34 AM  Result Value Ref Range Status   Result 1 Comment  Final  Comment: (NOTE) KOH/Calcofluor preparation:  no fungus observed. Performed At: [facility] [address] [place], [place] [id number] Jolene Schimke MD ZH:[id number]   Blood culture (routine x 2)     Status: None (Preliminary result)   Collection Time: 09/29/20  8:20 PM   Specimen: Right Antecubital; Blood  Result Value Ref  Range Status   Specimen Description RIGHT ANTECUBITAL  Final   Special Requests   Final    BOTTLES DRAWN AEROBIC AND ANAEROBIC Blood Culture adequate volume   Culture   Final    NO GROWTH 4 DAYS Performed at [facility], [address] [place], [place] [address]    Report Status PENDING  Incomplete  Resp Panel by RT-PCR (Flu A&B, Covid) Nasopharyngeal Swab     Status: None   Collection Time: 09/29/20 10:17 PM   Specimen: Nasopharyngeal Swab; Nasopharyngeal(NP) swabs in vial transport medium  Result Value Ref Range Status   SARS Coronavirus 2 by RT PCR NEGATIVE NEGATIVE Final    Comment: (NOTE) SARS-CoV-2 target nucleic acids are NOT DETECTED.  The SARS-CoV-2 RNA is generally detectable in upper respiratory specimens during the acute phase of infection. The lowest concentration of SARS-CoV-2 viral copies this assay can detect is 138 copies/mL. A negative result does not preclude SARS-Cov-2 infection and should not be used as the sole basis for treatment or other patient management decisions. A negative result may occur with  improper specimen collection/handling, submission of specimen other than nasopharyngeal swab, presence of viral mutation(s) within the areas targeted by this assay, and inadequate number of viral copies(<138 copies/mL). A negative result must be combined with clinical observations, patient history, and epidemiological information. The expected result is Negative.  Fact Sheet for Patients:  BloggerCourse.com  Fact Sheet for Healthcare Providers:  SeriousBroker.it  This test is no t yet approved or cleared by the [place] FDA and  has been authorized for detection and/or diagnosis of SARS-CoV-2 by FDA under an Emergency Use Authorization (EUA). This EUA will remain  in effect (meaning this test can be used) for the duration of the COVID-19 declaration under Section 564(b)(1) of the Act,  21 U.S.C.section 360bbb-3(b)(1), unless the authorization is terminated  or revoked sooner.       Influenza A by PCR NEGATIVE NEGATIVE Final   Influenza B by PCR NEGATIVE NEGATIVE Final    Comment: (NOTE) The Xpert Xpress SARS-CoV-2/FLU/RSV plus assay is intended as an aid in the diagnosis of influenza from Nasopharyngeal swab specimens and should not be used as a sole basis for treatment. Nasal washings and aspirates are unacceptable for Xpert Xpress SARS-CoV-2/FLU/RSV testing.  Fact Sheet for Patients: BloggerCourse.com  Fact Sheet for Healthcare Providers: SeriousBroker.it  This test is not yet approved or cleared by the [place] FDA and has been authorized for detection and/or diagnosis of SARS-CoV-2 by FDA under an Emergency Use Authorization (EUA). This EUA will remain in effect (meaning this test can be used) for the duration of the COVID-19 declaration under Section 564(b)(1) of the Act, 21 U.S.C. section 360bbb-3(b)(1), unless the authorization is terminated or revoked.  Performed at [facility], [address], [place], [place] [address]   MRSA PCR Screening     Status: None   Collection Time: 09/30/20  1:20 AM   Specimen: Nasal Mucosa; Nasopharyngeal  Result Value Ref Range Status   MRSA by PCR NEGATIVE NEGATIVE Final    Comment:        The GeneXpert MRSA Assay (FDA approved  for NASAL specimens only), is one component of a comprehensive MRSA colonization surveillance program. It is not intended to diagnose MRSA infection nor to guide or monitor treatment for MRSA infections. Performed at [facility], [address] [place], [place] [address]   Blood culture (routine x 2)     Status: None (Preliminary result)   Collection Time: 09/30/20  3:17 AM   Specimen: BLOOD  Result Value Ref Range Status   Specimen Description BLOOD SITE NOT SPECIFIED  Final   Special Requests   Final    BOTTLES  DRAWN AEROBIC ONLY Blood Culture results may not be optimal due to an inadequate volume of blood received in culture bottles   Culture   Final    NO GROWTH 3 DAYS Performed at [facility], [address] [place], [place] [address]    Report Status PENDING  Incomplete     Radiology Studies: [place] EKG SITE RITE  Result Date: 10/02/2020 If Site Rite image not attached, placement could not be confirmed due to current cardiac rhythm.   Marlin Canary DO [facility]  Between 7 am - 7 pm I am available, please contact me via Amion (for emergencies) or Securechat (non urgent messages)  Between 7 pm - 7 am I am not available, please contact night coverage MD/APP via Amion

## 2020-10-03 NOTE — Plan of Care (Signed)

## 2020-10-03 NOTE — Progress Notes (Signed)
Pharmacy Antibiotic Note  Andrea King is a 45 y.o. female admitted on 09/29/2020 with tricuspid valve endocarditis. 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 Pharmacy has been consulted for vancomycin, cefepime and gentamicin dosing.   SCr today is stable at 0.7.  Vancomycin peak level = 37, trough = 15.  Calculated AUC above goal at 616.  Plan: -Adjust vancomycin to 1250 mg IV q 24 hrs. Goal AUC 400-550  F/u for recheck of Vanc levels  -Continue Gentamicin 60 mg every 12 hours Goal peak: 3-4 and trough < 1   -Rifampin 300 mg IV Q 8 hours -Cefepime 2 gm IV q 8 hours -Monitor renal function closely    Height: 5\' 2"  (157.5 cm) Weight: 64.1 kg (141 lb 5 oz) IBW/kg (Calculated) : 50.1  Temp (24hrs), Avg:98.5 F (36.9 C), Min:98.3 F (36.8 C), Max:98.8 F (37.1 C)  Recent Labs  Lab 09/27/20 1330 09/28/20 0439 09/29/20 0533 09/29/20 0822 09/29/20 1842 09/29/20 1843 10/01/20 0116 10/02/20 2100 10/02/20 2115 10/03/20 0610  WBC  --  14.1* 12.2*  --   --  24.3* 23.6*  --   --  20.1*  CREATININE  --  0.74 0.62  --   --  0.65 0.50  --  0.66 0.70  LATICACIDVEN  --   --   --   --  1.5  --   --   --   --   --   VANCOTROUGH 13*  --   --   --   --   --   --   --   --  15  VANCOPEAK  --   --   --   --   --   --   --  37  --   --   GENTTROUGH  --   --  0.8  --   --   --   --   --   --   --   GENTPEAK  --   --   --  2.2*  --   --   --   --   --   --     Estimated Creatinine Clearance: 77.3 mL/min (by C-G formula based on SCr of 0.7 mg/dL).    No Known Allergies  Antimicrobials this admission:  5/27 gent>> 6/10 5/27 vanc>> 7/8 5/29 cefepime>> 7/8 5/30 Rifampin>>7/8  Dose adjustments this admission:  5/29 Gent Trough @0533 = 0.8 mcg/ml 5/29 Gent Peak@0822 = 2.2 mcg/ml  5/27 VT= 13; dose increased to 750mg  IV q12h 6/1-6/2; VP  = 37; VT = 15 on 750 q 12 hrs - chg to 1250 q 24 hrs.  Microbiology results:  5/30 blood x2- ngtd 5/29 blood x3- ngtd 5/27 csf- neg   6/30, BCPS, [facility] Clinical Pharmacist  10/03/2020 7:43 AM   [facility] pharmacy phone numbers are listed on [email or web address]

## 2020-10-03 NOTE — Plan of Care (Signed)
  Problem: Education: Goal: Knowledge of General Education information will improve Description: Including pain rating scale, medication(s)/side effects and non-pharmacologic comfort measures Outcome: Progressing   Problem: Clinical Measurements: Goal: Diagnostic test results will improve Outcome: Progressing   Problem: Nutrition: Goal: Adequate nutrition will be maintained Outcome: Progressing   Problem: Coping: Goal: Level of anxiety will decrease Outcome: Progressing   Problem: Elimination: Goal: Will not experience complications related to bowel motility Outcome: Progressing Goal: Will not experience complications related to urinary retention Outcome: Progressing   Problem: Skin Integrity: Goal: Risk for impaired skin integrity will decrease Outcome: Progressing

## 2020-10-04 LAB — CBC WITH DIFFERENTIAL/PLATELET
Abs Immature Granulocytes: 0.24 10*3/uL — ABNORMAL HIGH (ref 0.00–0.07)
Basophils Absolute: 0 10*3/uL (ref 0.0–0.1)
Basophils Relative: 0 %
Eosinophils Absolute: 0.1 10*3/uL (ref 0.0–0.5)
Eosinophils Relative: 1 %
HCT: 28.4 % — ABNORMAL LOW (ref 36.0–46.0)
Hemoglobin: 9 g/dL — ABNORMAL LOW (ref 12.0–15.0)
Immature Granulocytes: 1 %
Lymphocytes Relative: 8 %
Lymphs Abs: 1.3 10*3/uL (ref 0.7–4.0)
MCH: 26.2 pg (ref 26.0–34.0)
MCHC: 31.7 g/dL (ref 30.0–36.0)
MCV: 82.8 fL (ref 80.0–100.0)
Monocytes Absolute: 1.2 10*3/uL — ABNORMAL HIGH (ref 0.1–1.0)
Monocytes Relative: 7 %
Neutro Abs: 14.3 10*3/uL — ABNORMAL HIGH (ref 1.7–7.7)
Neutrophils Relative %: 83 %
Platelets: 275 10*3/uL (ref 150–400)
RBC: 3.43 MIL/uL — ABNORMAL LOW (ref 3.87–5.11)
RDW: 16.4 % — ABNORMAL HIGH (ref 11.5–15.5)
WBC: 17.2 10*3/uL — ABNORMAL HIGH (ref 4.0–10.5)
nRBC: 0 % (ref 0.0–0.2)

## 2020-10-04 LAB — COMPREHENSIVE METABOLIC PANEL
ALT: 13 U/L (ref 0–44)
AST: 12 U/L — ABNORMAL LOW (ref 15–41)
Albumin: 2.9 g/dL — ABNORMAL LOW (ref 3.5–5.0)
Alkaline Phosphatase: 53 U/L (ref 38–126)
Anion gap: 7 (ref 5–15)
BUN: 11 mg/dL (ref 6–20)
CO2: 22 mmol/L (ref 22–32)
Calcium: 8.1 mg/dL — ABNORMAL LOW (ref 8.9–10.3)
Chloride: 102 mmol/L (ref 98–111)
Creatinine, Ser: 0.75 mg/dL (ref 0.44–1.00)
GFR, Estimated: >60 mL/min (ref >60)
Glucose, Bld: 152 mg/dL — ABNORMAL HIGH (ref 70–99)
Potassium: 3.4 mmol/L — ABNORMAL LOW (ref 3.5–5.1)
Sodium: 131 mmol/L — ABNORMAL LOW (ref 135–145)
Total Bilirubin: 0.5 mg/dL (ref 0.3–1.2)
Total Protein: 7.1 g/dL (ref 6.5–8.1)

## 2020-10-04 LAB — CULTURE, BLOOD (ROUTINE X 2)
Culture: NO GROWTH
Special Requests: ADEQUATE

## 2020-10-04 LAB — GENTAMICIN LEVEL, PEAK: Gentamicin Pk: 5.8 ug/mL (ref 5.0–10.0)

## 2020-10-04 LAB — MAGNESIUM: Magnesium: 2.3 mg/dL (ref 1.7–2.4)

## 2020-10-04 LAB — GENTAMICIN LEVEL, TROUGH: Gentamicin Trough: 0.9 ug/mL (ref 0.5–2.0)

## 2020-10-04 MED ORDER — SODIUM CHLORIDE 0.9 % IV BOLUS
500.0000 mL | Freq: Once | INTRAVENOUS | Status: AC
  Administered 2020-10-04: 500 mL via INTRAVENOUS

## 2020-10-04 MED ORDER — SODIUM CHLORIDE 0.9 % IV SOLN
300.0000 mg | Freq: Three times a day (TID) | INTRAVENOUS | Status: DC
  Filled 2020-10-04 (×2): qty 300

## 2020-10-04 MED ORDER — VANCOMYCIN HCL 1250 MG/250ML IV SOLN
1250.0000 mg | INTRAVENOUS | Status: DC
  Administered 2020-10-05 – 2020-10-09 (×5): 1250 mg via INTRAVENOUS
  Filled 2020-10-04 (×6): qty 250

## 2020-10-04 MED ORDER — RISAQUAD PO CAPS
2.0000 | ORAL_CAPSULE | Freq: Every day | ORAL | Status: DC
  Administered 2020-10-04 – 2020-10-12 (×9): 2 via ORAL
  Filled 2020-10-04 (×9): qty 2

## 2020-10-04 MED ORDER — GENTAMICIN SULFATE 40 MG/ML IJ SOLN
50.0000 mg | Freq: Two times a day (BID) | INTRAVENOUS | Status: AC
  Administered 2020-10-04 – 2020-10-11 (×15): 50 mg via INTRAVENOUS
  Filled 2020-10-04 (×16): qty 1.25

## 2020-10-04 MED ORDER — POTASSIUM CHLORIDE 10 MEQ/50ML IV SOLN
10.0000 meq | INTRAVENOUS | Status: AC
  Administered 2020-10-04 (×2): 10 meq via INTRAVENOUS
  Filled 2020-10-04 (×2): qty 50

## 2020-10-04 MED ORDER — RIFABUTIN 150 MG PO CAPS
300.0000 mg | ORAL_CAPSULE | Freq: Every day | ORAL | Status: DC
  Filled 2020-10-04 (×3): qty 2

## 2020-10-04 NOTE — Progress Notes (Signed)
Pharmacy Antibiotic Note  Andrea King is a 45 y.o. female admitted on 09/29/2020 with tricuspid valve endocarditis. 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 Pharmacy has been consulted for vancomycin, cefepime and gentamicin dosing.   SCr today is stable at 0.75 and CrCl 77 ml/min. Gentamicin trough today was 0.9 about 9.5 hours after the last dose. Peak was supratherapeutic at 5.8. Per RN, gentamicin did not stop infusing till around 1:50 so this level likely reflects a true peak.     Plan: -Adjust vancomycin to 1250 mg IV q 24 hrs. Goal AUC 400-550  F/u for recheck of Vanc levels  -Reduce gentamicin to 50 mg IV Q 12 hours  Goal peak: 3-4 and trough < 1   -Rifabutin 300 mg daily  -Cefepime 2 gm IV q 8 hours -Monitor renal function closely    Height: 5\' 2"  (157.5 cm) Weight: 64.1 kg (141 lb 5 oz) IBW/kg (Calculated) : 50.1  Temp (24hrs), Avg:98.9 F (37.2 C), Min:98.4 F (36.9 C), Max:100.1 F (37.8 C)  Recent Labs  Lab 09/29/20 0533 09/29/20 0822 09/29/20 1842 09/29/20 1843 10/01/20 0116 10/02/20 2100 10/02/20 2115 10/03/20 0610 10/04/20 0455 10/04/20 1000 10/04/20 1420  WBC 12.2*  --   --  24.3* 23.6*  --   --  20.1* 17.2*  --   --   CREATININE 0.62  --   --  0.65 0.50  --  0.66 0.70 0.75  --   --   LATICACIDVEN  --   --  1.5  --   --   --   --   --   --   --   --   VANCOTROUGH  --   --   --   --   --   --   --  15  --   --   --   VANCOPEAK  --   --   --   --   --  37  --   --   --   --   --   GENTTROUGH 0.8  --   --   --   --   --   --   --   --  0.9  --   GENTPEAK  --  2.2*  --   --   --   --   --   --   --   --  5.8    Estimated Creatinine Clearance: 77.3 mL/min (by C-G formula based on SCr of 0.75 mg/dL).    No Known Allergies  Antimicrobials this admission:  5/27 gent>> 6/10 5/27 vanc>> 7/8 5/29  cefepime>> 7/8 5/30 Rifampin>>7/8  Dose adjustments this admission:  5/29 Gent Trough @0533 = 0.8 mcg/ml 5/29 Gent Peak@0822 = 2.2 mcg/ml  5/27 VT= 13; dose increased to 750mg  IV q12h 6/1-6/2; VP = 37; VT = 15 on 750 q 12 hrs - chg to 1250 q 24 hrs. 6/3: Gent trough = 0.9 , gent peak = 5.8 >>Reduce to 50 q 12   Microbiology results:  5/30 blood x2- ngtd 5/29 blood x3- ngtd 5/27 csf- neg   6/30, PharmD, BCPS, BCIDP Infectious Diseases Clinical Pharmacist Phone: [phone]  10/04/2020 3:43 PM   [facility] pharmacy phone numbers are listed on [email or web address]

## 2020-10-04 NOTE — Progress Notes (Signed)
Patient unhappy with rifampin due to N&V, diarrhea, skin breakdown on buttocks. She spoke with infectious disease in rounds this a.m. Medication was changed from rifampin to rifabutin oral. Patient now refuses mycobutin, stating "just needs a break." Explained importance of antibiotics and that you can't just "take a break," as this may cause a more resistant infection. Patient state she understood and continues to refuse new oral med.

## 2020-10-04 NOTE — Progress Notes (Signed)
HOSPITAL MEDICINE OVERNIGHT EVENT NOTE     Notified by nursing that they are concerned about patient's climbing heart rate.  Heart rate currently in the 120s.    According to nursing patient is currently not complaining of any pain, currently not febrile, currently not exhibiting any significant anxiety.  Review of vital signs reveals the patient has become somewhat hypotensive with systolic blood pressures in the 90s since early in the afternoon.  Will provide patient with 1/2 L bolus of normal saline (EF reviewed via last Echo and found to be preserved) and monitor for improvement in blood pressures.  At this point I do not believe any other intervention is necessary concerning the patient's tachycardia, and is likely secondary to underlying infection as well as ongoing opiate withdrawal.  Marinda Elk  MD [facility]

## 2020-10-04 NOTE — Plan of Care (Signed)
  Problem: Education: Goal: Knowledge of General Education information will improve Description: Including pain rating scale, medication(s)/side effects and non-pharmacologic comfort measures Outcome: Progressing   Problem: Health Behavior/Discharge Planning: Goal: Ability to manage health-related needs will improve Outcome: Progressing   Problem: Clinical Measurements: Goal: Ability to maintain clinical measurements within normal limits will improve Outcome: Progressing Goal: Cardiovascular complication will be avoided Outcome: Progressing   Problem: Activity: Goal: Risk for activity intolerance will decrease Outcome: Progressing   Problem: Nutrition: Goal: Adequate nutrition will be maintained Outcome: Progressing   Problem: Coping: Goal: Level of anxiety will decrease Outcome: Progressing   Problem: Pain Managment: Goal: General experience of comfort will improve Outcome: Progressing

## 2020-10-04 NOTE — Progress Notes (Signed)
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  On 5/24 she had respiratory failure and had to be intubated, eventually extubated 5/29 and then when she woke up she decided to leave AMA and apparently came straight to [facility], ER.  ID was consulted.  Subjective / 24h Interval events: Asking for increasing amounts of suboxone  Assessment & Plan:  Tricuspid valve endocarditis, sepsis, POA -initially admitted at [facility] but left AMA on 5/29. - underwent a TEE on 5/26 which showed EF 60-65%, normal RV, and evidence of tricuspid valve endocarditis with a 1 cm vegetation, and mild to moderate tricuspid valve regurgitation. -Infectious disease consulted, appreciate input.  Current cultures are negative but she does have a history of MRSA/Serratia bacteremia.  Due to altered mental status she underwent an LP 5/27 which was unremarkable without concerns for CNS infection. -Continue abx per ID -Monitor WBC and fever curve.  Anemia of chronic disease -hemoglobin stable  Multifocal pneumonia-possibly with septic emboli, chest x-ray on admission shows improvement.  Continue antibiotics  Hypokalemia/hypomagnesemia -ongoing aggressive repletion needed  Polysubstance abuse/IVDU -patient very concerned about withdrawals.  She prefers buprenorphine versus methadone.  -adjust as able -tele sitter if needed as there were reports of erratic behavior by family -can use clonidine for withdrawal as well  Liver cirrhosis, history of hep C  -outpatient follow  up  Cervical neck pain -CT scan from 5/22: Postoperative changes from prior anterior fusion at C3 through C7, with posterior fusion at C2 through T2. Periprosthetic lucency about the transpedicular screws on the left at C4 and bilaterally at T1 and T2, suggestive of loosening. -surgery was at [facility] so will need to follow up there -if continues to c/o pain could get MRI and surgical consult here If needed- no neurologic deficits   Goals of care-palliative consulted while at [facility], remains full code/full scope.  No need to reinvolve palliative at this point  Scheduled Meds: . acidophilus  2 capsule Oral Daily  . buprenorphine  8 mg Sublingual Daily  . Chlorhexidine Gluconate Cloth  6 each Topical Daily  . cloNIDine  0.1 mg Oral BID  . rifabutin  300 mg Oral Daily  . sodium chloride flush  10-40 mL Intracatheter Q12H  . traZODone  50 mg Oral QHS   Continuous Infusions: . ceFEPime (MAXIPIME) IV Stopped (10/04/20 0631)  . gentamicin 60 mg (10/04/20 1140)  . promethazine (PHENERGAN) injection (IM or IVPB) 200 mL/hr at 10/04/20 0258  . [START ON 10/05/2020] vancomycin     PRN Meds:.acetaminophen **OR** acetaminophen, alum & mag hydroxide-simeth, hydrOXYzine, promethazine (PHENERGAN) injection (IM or IVPB), sodium chloride flush  Diet Orders (From admission, onward)    Start     Ordered   09/29/20 2059  Diet Heart Room service appropriate? Yes; Fluid consistency: Thin  Diet effective now       Question Answer Comment  Room service appropriate? Yes   Fluid consistency: Thin      09/29/20 2102          DVT prophylaxis: SCDs Start: 09/29/20 2058     Code Status:  Full Code  Family Communication: brother at bedside   Status is: Inpatient because: Persistent severe electrolyte disturbances, IV treatments appropriate due to intensity of illness or inability to take PO and Inpatient level of care appropriate due to severity of illness  Dispo: The patient is from: Home               Anticipated d/c is to: Home              Patient currently is not medically stable to d/c.   Difficult to place patient No  Level of care: Telemetry Medical  Consultants:  ID    Objective: Vitals:   10/03/20 2255 10/04/20 0422 10/04/20 0700 10/04/20 1144  BP: 123/76 129/69 110/60 101/62  Pulse: (!) 116 (!) 114 95 (!) 122  Resp: 17 19 19 20   Temp: 98.8 F (37.1 C) 100.1 F (37.8 C) 98.7 F (37.1 C) 98.9 F (37.2 C)  TempSrc: Oral Oral Oral Oral  SpO2: 98% 97% 98% 99%  Weight:      Height:        Intake/Output Summary (Last 24 hours) at 10/04/2020 1430 Last data filed at 10/04/2020 12/04/2020 Gross per 24 hour  Intake 1688.44 ml  Output 950 ml  Net 738.44 ml   Filed Weights   09/30/20 0015  Weight: 64.1 kg    Examination:   General: Appearance:     Overweight female in no acute distress     Lungs:     respirations unlabored  Heart:    Tachycardic. Normal rhythm. No murmurs, rubs, or gallops.   MS:   All extremities are intact.   Neurologic:   Awake, alert- poor eye contact    Data Reviewed: I have independently reviewed following labs and imaging studies   CBC: Recent Labs  Lab 09/28/20 0439 09/29/20 0533 09/29/20 1106 09/29/20 1843 10/01/20 0116 10/03/20 0610 10/04/20 0455  WBC 14.1* 12.2*  --  24.3* 23.6* 20.1* 17.2*  NEUTROABS 10.1*  --   --  19.4* 18.6* 16.7* 14.3*  HGB 7.6* 7.0* 7.1* 8.4* 8.8* 8.3* 9.0*  HCT 24.0* 22.0* 22.9* 26.9* 26.4* 25.8* 28.4*  MCV 82.2 82.1  --  82.3 80.7 82.7 82.8  PLT 65* 119*  --  206 214 294 275   Basic Metabolic Panel: Recent Labs  Lab 09/28/20 0439 09/29/20 0533 09/29/20 1843 10/01/20 0116 10/02/20 2115 10/03/20 0610 10/04/20 0455  NA 148* 146* 144 135 134* 134* 131*  K 3.5 3.3* 2.4* 2.5* 2.8* 2.8* 3.4*  CL 121* 120* 116* 95* 98 103 102  CO2 22 21* 21* 28 25 23 22   GLUCOSE 139* 147* 95 85 92 92 152*  BUN 24* 22* 15 8 10 7 11   CREATININE 0.74 0.62 0.65 0.50 0.66 0.70 0.75  CALCIUM 7.5* 7.2* 8.0* 8.0* 8.0*  8.1* 8.1*  MG 2.4 2.3  --  1.6*  --   --  2.3  PHOS 3.8 3.5  --   --   --   --   --    Liver Function Tests: Recent Labs  Lab 09/28/20 0439 09/29/20 1843 10/02/20 2115 10/03/20 0610 10/04/20 0455  AST 20 23 19  14* 12*  ALT 19 21 17 13 13   ALKPHOS 48 48 53 56 53  BILITOT 0.9 1.0 1.3* 0.8 0.5  PROT 5.4* 6.0* 6.4* 6.4* 7.1  ALBUMIN 2.2* 2.5* 2.7* 2.7* 2.9*   Coagulation Profile: No results for input(s): INR, PROTIME in the last 168 hours. HbA1C: No results for input(s):  HGBA1C in the last 72 hours. CBG: Recent Labs  Lab 09/28/20 1944 09/28/20 2336 09/29/20 0400 09/29/20 0728 09/29/20 1116  GLUCAP 131* 134* 124* 137* 98    Recent Results (from the past 240 hour(s))  Culture, Respiratory w Gram Stain     Status: None   Collection Time: 09/25/20  7:50 AM   Specimen: Tracheal Aspirate; Respiratory  Result Value Ref Range Status   Specimen Description   Final    TRACHEAL ASPIRATE Performed at [facility], [address], [place], [place] [address]    Special Requests   Final    NONE Performed at [facility], [address] [place], [place] [address]    Gram Stain   Final    RARE WBC PRESENT,BOTH PMN AND MONONUCLEAR NO ORGANISMS SEEN Performed at [facility], [address], [place], [place] [address]    Culture RARE CANDIDA TROPICALIS  Final   Report Status 09/27/2020 FINAL  Final  CSF culture w Gram Stain     Status: None   Collection Time: 09/27/20 10:34 AM   Specimen: PATH Cytology CSF; Cerebrospinal Fluid  Result Value Ref Range Status   Specimen Description   Final    CSF Performed at [facility], [address], [place], [place] [address]    Special Requests   Final    NONE Performed at [facility], [address] [place], [place] [address]    Gram Stain   Final    NO ORGANISMS SEEN WBC SEEN RED BLOOD CELLS PRESENT    Culture   Final    NO GROWTH 3 DAYS Performed at [facility],  [address], [place], [place] [address]    Report Status 09/30/2020 FINAL  Final  Fungus Culture With Stain     Status: None (Preliminary result)   Collection Time: 09/27/20 10:34 AM   Specimen: PATH Cytology CSF; Cerebrospinal Fluid  Result Value Ref Range Status   Fungus Stain Final report  Final    Comment: (NOTE) Performed At: [facility] [address] [place], [place] [id number] Jolene Schimke MD [id number]    Fungus (Mycology) Culture PENDING  Incomplete   Fungal Source CSF  Final    Comment: Performed at [facility], [address] [place], [place] [address]  Anaerobic culture w Gram Stain     Status: None   Collection Time: 09/27/20 10:34 AM   Specimen: PATH Cytology CSF; Cerebrospinal Fluid  Result Value Ref Range Status   Specimen Description   Final    CSF Performed at [facility], [address], [place], [place] [address]    Special Requests   Final    NONE Performed at [facility], [address] [place], [place] [address]    Gram Stain CYTOSPIN SMEAR NO WBC SEEN NO ORGANISMS SEEN   Final   Culture   Final    NO ANAEROBES ISOLATED Performed at [facility], [address], [place], [place] [address]    Report Status 10/02/2020 FINAL  Final  Fungus Culture Result     Status: None   Collection Time: 09/27/20 10:34 AM  Result Value Ref Range Status   Result 1 Comment  Final    Comment: (NOTE) KOH/Calcofluor preparation:  no fungus observed. Performed At: [facility] [address] [place], [place] [id number] Jolene Schimke MD YO:[id number]   Blood culture (routine x 2)     Status: None  Collection Time: 09/29/20  8:20 PM   Specimen: Right Antecubital; Blood  Result Value Ref Range Status   Specimen Description RIGHT ANTECUBITAL  Final   Special Requests   Final    BOTTLES DRAWN AEROBIC AND ANAEROBIC Blood Culture adequate volume   Culture   Final    NO GROWTH 5 DAYS Performed at [facility], [address] [place], [place] [address]    Report Status 10/04/2020 FINAL  Final  Resp Panel by RT-PCR (Flu A&B, Covid) Nasopharyngeal Swab     Status: None   Collection Time: 09/29/20 10:17 PM   Specimen: Nasopharyngeal Swab; Nasopharyngeal(NP) swabs in vial transport medium  Result Value Ref Range Status   SARS Coronavirus 2 by RT PCR NEGATIVE NEGATIVE Final    Comment: (NOTE) SARS-CoV-2 target nucleic acids are NOT DETECTED.  The SARS-CoV-2 RNA is generally detectable in upper respiratory specimens during the acute phase of infection. The lowest concentration of SARS-CoV-2 viral copies this assay can detect is 138 copies/mL. A negative result does not preclude SARS-Cov-2 infection and should not be used as the sole basis for treatment or other patient management decisions. A negative result may occur with  improper specimen collection/handling, submission of specimen other than nasopharyngeal swab, presence of viral mutation(s) within the areas targeted by this assay, and inadequate number of viral copies(<138 copies/mL). A negative result must be combined with clinical observations, patient history, and epidemiological information. The expected result is Negative.  Fact Sheet for Patients:  BloggerCourse.com  Fact Sheet for Healthcare Providers:  SeriousBroker.it  This test is no t yet approved or cleared by the [place] FDA and  has been authorized for detection and/or diagnosis of SARS-CoV-2 by FDA under an Emergency Use Authorization (EUA). This EUA will remain  in effect (meaning this test can be used) for the duration of the COVID-19 declaration under Section 564(b)(1) of the Act, 21 U.S.C.section 360bbb-3(b)(1), unless the authorization is terminated  or revoked sooner.       Influenza A by PCR NEGATIVE NEGATIVE Final   Influenza B by PCR NEGATIVE NEGATIVE Final    Comment: (NOTE) The Xpert Xpress  SARS-CoV-2/FLU/RSV plus assay is intended as an aid in the diagnosis of influenza from Nasopharyngeal swab specimens and should not be used as a sole basis for treatment. Nasal washings and aspirates are unacceptable for Xpert Xpress SARS-CoV-2/FLU/RSV testing.  Fact Sheet for Patients: BloggerCourse.com  Fact Sheet for Healthcare Providers: SeriousBroker.it  This test is not yet approved or cleared by the [place] FDA and has been authorized for detection and/or diagnosis of SARS-CoV-2 by FDA under an Emergency Use Authorization (EUA). This EUA will remain in effect (meaning this test can be used) for the duration of the COVID-19 declaration under Section 564(b)(1) of the Act, 21 U.S.C. section 360bbb-3(b)(1), unless the authorization is terminated or revoked.  Performed at [facility], [address], [place], [place] [address]   MRSA PCR Screening     Status: None   Collection Time: 09/30/20  1:20 AM   Specimen: Nasal Mucosa; Nasopharyngeal  Result Value Ref Range Status   MRSA by PCR NEGATIVE NEGATIVE Final    Comment:        The GeneXpert MRSA Assay (FDA approved for NASAL specimens only), is one component of a comprehensive MRSA colonization surveillance program. It is not intended to diagnose MRSA infection nor to guide or monitor treatment for MRSA infections. Performed at [facility], [address] [place], [place] [address]   Blood culture (routine x 2)     Status: None (Preliminary result)   Collection Time: 09/30/20  3:17 AM   Specimen: BLOOD  Result Value Ref Range Status   Specimen Description BLOOD SITE NOT SPECIFIED  Final   Special Requests   Final    BOTTLES DRAWN AEROBIC ONLY Blood Culture results may not be optimal due to an inadequate volume of blood received in culture bottles   Culture   Final    NO GROWTH 4 DAYS Performed at [facility], [address]  [place], [place] [address]    Report Status PENDING  Incomplete     Radiology Studies: No results found.  Marlin CanaryJessica Shina Wass DO [facility]  Between 7 am - 7 pm I am available, please contact me via Amion (for emergencies) or Securechat (non urgent messages)  Between 7 pm - 7 am I am not available, please contact night coverage MD/APP via Amion

## 2020-10-04 NOTE — Progress Notes (Addendum)
I have seen and examined the patient. I have personally reviewed the clinical findings, laboratory findings, microbiological data and imaging studies. The assessment and treatment plan was discussed with the  Advance Practice Provider, Jeanine Luz  I agree with her/his recommendations except following additions/corrections.  45 y.o. 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 Left AMA on 5/29 and came back <1 day to finish out her medical treatment at [facility]. Started on culture negative endocarditis with Vancomycin, cefepime, gentamicin and rifampin - thought to be poor surgical candidate due to h/o redo sternotomy.  Has had trouble with nausea and vomiting with PO rifampin since yesterday. Complaining about her urine turning yellow  Abdomen is soft and  Non tender. LFT WNL  Discussed with her regarding switching her current antiemetics to help with the nausea. She is not interested in that. She says she prefers to have Rifampin IV like the rest of the medications. Discussed with her orange color of urine is expected with Rifampin and nothing to worry about.   Per RN, she has one lumen PICC line and another peripheral only with 3 antibiotics that are running  IV. Hence, she is not getting antiemetics in a timely fashion due to not having adequate IV access.   Would look into her IV access situation for IV antiemetics to continue PO rifampin given shortage of IV rifampin. If not then, can switch to PO rifabutin Continue Vancomycin/cefepime/gentamicin ( dosing per pharmacy)  as is with  previously planned  end date per Dr Drue Second  Monitor CBC, CMP Monitoring of Vancomycin trough and Gentamicin levels per pharmacy Monitor for signs of ototoxicity with gentamicin, would get a baseline audiometry if possible   Dr Earlene Plater is on call this weekend with questions. 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 Having nausea/vomiting which she relates to the rifampin. Discussed importance of broad spectrum coverage given her prosthetic valve infection and that she is not a surgical candidate in attempts to do everything that can be done for this infection. Will change rifampin to rifabutin and see if that improves her tolerance. Continue vancomycin, cefepime, and gentamicin    PLAN:  1. Continue vancomycin, cefepime and gentamicin.  2. Change rifampin to IV rifampin. If IV rifampin is not available, can do PO rifabutin  3. Monitor renal function and vancomycin levels per protocol. 4. Opioid use disorder and pain management per primary team.   Principal Problem:   Endocarditis of prosthetic tricuspid valve (HCC) Active Problems:   Opiate abuse, continuous (HCC)   Protein-calorie malnutrition, severe   Hypokalemia   Anemia in other chronic diseases classified elsewhere   Chronic hepatitis C without hepatic coma (HCC)  Cirrhosis of liver without ascites (HCC)   Endocarditis   . buprenorphine  8 mg Sublingual Daily  . Chlorhexidine Gluconate Cloth  6 each Topical Daily  . cloNIDine  0.1 mg Oral BID  . rifampin  300 mg Oral Q8H  . sodium chloride flush  10-40 mL Intracatheter Q12H  . traZODone  50  mg Oral QHS    SUBJECTIVE:  Mildly elevated temperature overnight. Been having nausea with rifampin. Phenergan has been scheduled to avert this. Leukocytosis slowly improving. Having diarrhea and nausea/vomitng with concern for rifampin.   No Known Allergies   Review of Systems: Review of Systems  Constitutional: Negative for chills, fever and weight loss.  Respiratory: Negative for cough, shortness of breath and wheezing.   Cardiovascular: Negative for chest pain and leg swelling.  Gastrointestinal: Negative for abdominal pain, constipation, diarrhea, nausea and vomiting.  Skin: Negative for rash.      OBJECTIVE: Vitals:   10/03/20 1945 10/03/20 2255 10/04/20 0422 10/04/20 0700  BP: 121/75 123/76 129/69 110/60  Pulse: (!) 118 (!) 116 (!) 114 95  Resp: 14 17 19 19   Temp: 98.4 F (36.9 C) 98.8 F (37.1 C) 100.1 F (37.8 C) 98.7 F (37.1 C)  TempSrc: Oral Oral Oral Oral  SpO2: 99% 98% 97% 98%  Weight:      Height:       Body mass index is 25.85 kg/m.  Physical Exam Constitutional:      General: She is not in acute distress. Cardiovascular:     Rate and Rhythm: Normal rate and regular rhythm.     Heart sounds: Normal heart sounds.  Pulmonary:     Effort: Pulmonary effort is normal.     Breath sounds: Normal breath sounds.  Skin:    General: Skin is warm and dry.  Neurological:     Mental Status: She is alert and oriented to person, place, and time.  Psychiatric:        Behavior: Behavior normal.        Thought Content: Thought content normal.        Judgment: Judgment normal.     Lab Results Lab Results  Component Value Date   WBC 17.2 (H) 10/04/2020   HGB 9.0 (L) 10/04/2020   HCT 28.4 (L) 10/04/2020   MCV 82.8 10/04/2020   PLT 275 10/04/2020    Lab Results  Component Value Date   CREATININE 0.75 10/04/2020   BUN 11 10/04/2020   NA 131 (L) 10/04/2020   K 3.4 (L) 10/04/2020   CL 102 10/04/2020   CO2 22 10/04/2020    Lab Results  Component  Value Date   ALT 13 10/04/2020   AST 12 (L) 10/04/2020   ALKPHOS 53 10/04/2020   BILITOT 0.5 10/04/2020     Microbiology: Recent Results (from the past 240 hour(s))  Culture, Respiratory w Gram Stain     Status: None   Collection Time: 09/25/20  7:50 AM   Specimen: Tracheal Aspirate; Respiratory  Result Value Ref Range Status   Specimen Description   Final    TRACHEAL ASPIRATE Performed at [facility], [address] [place], [place]    Special Requests   Final    NONE Performed at [facility], [address] [place], [place]    Gram Stain   Final    RARE WBC PRESENT,BOTH PMN AND MONONUCLEAR NO ORGANISMS SEEN Performed at [facility], [address], [place], [place]  Culture RARE CANDIDA TROPICALIS  Final   Report Status 09/27/2020 FINAL  Final  CSF culture w Gram Stain     Status: None   Collection Time: 09/27/20 10:34 AM   Specimen: PATH Cytology CSF; Cerebrospinal Fluid  Result Value Ref Range Status   Specimen Description   Final    CSF Performed at [facility], [address] [place], [place] [address]    Special Requests   Final    NONE Performed at [facility], [address] [place], [place] [address]    Gram Stain   Final    NO ORGANISMS SEEN WBC SEEN RED BLOOD CELLS PRESENT    Culture   Final    NO GROWTH 3 DAYS Performed at [facility], [address] [place], [place] [address]    Report Status 09/30/2020 FINAL  Final  Fungus Culture With Stain     Status: None (Preliminary result)   Collection Time: 09/27/20 10:34 AM   Specimen: PATH Cytology CSF; Cerebrospinal Fluid  Result Value Ref Range Status   Fungus Stain Final report  Final    Comment: (NOTE) Performed At: [facility] [address] [place], [place] [id number] Jolene SchimkeNagendra Sanjai MD OZ:[id number]:[phone]    Fungus (Mycology) Culture PENDING  Incomplete   Fungal Source CSF  Final    Comment:  Performed at [facility], [address] [place], [place] [address]  Anaerobic culture w Gram Stain     Status: None   Collection Time: 09/27/20 10:34 AM   Specimen: PATH Cytology CSF; Cerebrospinal Fluid  Result Value Ref Range Status   Specimen Description   Final    CSF Performed at [facility], [address] [place], [place] [address]    Special Requests   Final    NONE Performed at [facility], [address] [place], [place] [address]    Gram Stain CYTOSPIN SMEAR NO WBC SEEN NO ORGANISMS SEEN   Final   Culture   Final    NO ANAEROBES ISOLATED Performed at [facility], [address] [place], [place] [address]    Report Status 10/02/2020 FINAL  Final  Fungus Culture Result     Status: None   Collection Time: 09/27/20 10:34 AM  Result Value Ref Range Status   Result 1 Comment  Final    Comment: (NOTE) KOH/Calcofluor preparation:  no fungus observed. Performed At: [facility]  [address] [place], [place] [id number] Jolene SchimkeNagendra Sanjai MD QV:[id number]:[phone]   Blood culture (routine x 2)     Status: None   Collection Time: 09/29/20  8:20 PM   Specimen: Right Antecubital; Blood  Result Value Ref Range Status   Specimen Description RIGHT ANTECUBITAL  Final   Special Requests   Final    BOTTLES DRAWN AEROBIC AND ANAEROBIC Blood Culture adequate volume   Culture   Final    NO GROWTH 5 DAYS Performed at [facility], [address] [place], [place] [address]    Report Status 10/04/2020 FINAL  Final  Resp Panel by RT-PCR (Flu A&B, Covid) Nasopharyngeal Swab     Status: None   Collection Time: 09/29/20 10:17 PM   Specimen: Nasopharyngeal Swab; Nasopharyngeal(NP) swabs in vial transport medium  Result Value Ref Range Status   SARS Coronavirus 2 by RT PCR NEGATIVE NEGATIVE Final    Comment: (NOTE) SARS-CoV-2 target nucleic acids are NOT DETECTED.  The SARS-CoV-2 RNA is generally detectable in upper  respiratory  specimens during the acute phase of infection. The lowest concentration of SARS-CoV-2 viral copies this assay can detect is 138 copies/mL. A negative result does not preclude SARS-Cov-2 infection and should not be used as the sole basis for treatment or other patient management decisions. A negative result may occur with  improper specimen collection/handling, submission of specimen other than nasopharyngeal swab, presence of viral mutation(s) within the areas targeted by this assay, and inadequate number of viral copies(<138 copies/mL). A negative result must be combined with clinical observations, patient history, and epidemiological information. The expected result is Negative.  Fact Sheet for Patients:  BloggerCourse.com  Fact Sheet for Healthcare Providers:  SeriousBroker.it  This test is no t yet approved or cleared by the [place] FDA and  has been authorized for detection and/or diagnosis of SARS-CoV-2 by FDA under an Emergency Use Authorization (EUA). This EUA will remain  in effect (meaning this test can be used) for the duration of the COVID-19 declaration under Section 564(b)(1) of the Act, 21 U.S.C.section 360bbb-3(b)(1), unless the authorization is terminated  or revoked sooner.       Influenza A by PCR NEGATIVE NEGATIVE Final   Influenza B by PCR NEGATIVE NEGATIVE Final    Comment: (NOTE) The Xpert Xpress SARS-CoV-2/FLU/RSV plus assay is intended as an aid in the diagnosis of influenza from Nasopharyngeal swab specimens and should not be used as a sole basis for treatment. Nasal washings and aspirates are unacceptable for Xpert Xpress SARS-CoV-2/FLU/RSV testing.  Fact Sheet for Patients: BloggerCourse.com  Fact Sheet for Healthcare Providers: SeriousBroker.it  This test is not yet approved or cleared by the [place] FDA and has been  authorized for detection and/or diagnosis of SARS-CoV-2 by FDA under an Emergency Use Authorization (EUA). This EUA will remain in effect (meaning this test can be used) for the duration of the COVID-19 declaration under Section 564(b)(1) of the Act, 21 U.S.C. section 360bbb-3(b)(1), unless the authorization is terminated or revoked.  Performed at [facility], [address], [place], [place] [address]   MRSA PCR Screening     Status: None   Collection Time: 09/30/20  1:20 AM   Specimen: Nasal Mucosa; Nasopharyngeal  Result Value Ref Range Status   MRSA by PCR NEGATIVE NEGATIVE Final    Comment:        The GeneXpert MRSA Assay (FDA approved for NASAL specimens only), is one component of a comprehensive MRSA colonization surveillance program. It is not intended to diagnose MRSA infection nor to guide or monitor treatment for MRSA infections. Performed at [facility], [address], [place], [place] [address]   Blood culture (routine x 2)     Status: None (Preliminary result)   Collection Time: 09/30/20  3:17 AM   Specimen: BLOOD  Result Value Ref Range Status   Specimen Description BLOOD SITE NOT SPECIFIED  Final   Special Requests   Final    BOTTLES DRAWN AEROBIC ONLY Blood Culture results may not be optimal due to an inadequate volume of blood received in culture bottles   Culture   Final    NO GROWTH 4 DAYS Performed at [facility], [address], [place], [place] [address]    Report Status PENDING  Incomplete     Marcos Eke, NP [facility]  10/04/2020  8:49 AM

## 2020-10-05 LAB — COMPREHENSIVE METABOLIC PANEL
ALT: 11 U/L (ref 0–44)
AST: 13 U/L — ABNORMAL LOW (ref 15–41)
Albumin: 2.7 g/dL — ABNORMAL LOW (ref 3.5–5.0)
Alkaline Phosphatase: 49 U/L (ref 38–126)
Anion gap: 5 (ref 5–15)
BUN: 12 mg/dL (ref 6–20)
CO2: 21 mmol/L — ABNORMAL LOW (ref 22–32)
Calcium: 8.2 mg/dL — ABNORMAL LOW (ref 8.9–10.3)
Chloride: 105 mmol/L (ref 98–111)
Creatinine, Ser: 0.72 mg/dL (ref 0.44–1.00)
GFR, Estimated: >60 mL/min (ref >60)
Glucose, Bld: 164 mg/dL — ABNORMAL HIGH (ref 70–99)
Potassium: 3.7 mmol/L (ref 3.5–5.1)
Sodium: 131 mmol/L — ABNORMAL LOW (ref 135–145)
Total Bilirubin: 0.1 mg/dL — ABNORMAL LOW (ref 0.3–1.2)
Total Protein: 6.5 g/dL (ref 6.5–8.1)

## 2020-10-05 LAB — CBC WITH DIFFERENTIAL/PLATELET
Abs Immature Granulocytes: 0.21 10*3/uL — ABNORMAL HIGH (ref 0.00–0.07)
Basophils Absolute: 0 10*3/uL (ref 0.0–0.1)
Basophils Relative: 0 %
Eosinophils Absolute: 0.2 10*3/uL (ref 0.0–0.5)
Eosinophils Relative: 1 %
HCT: 25.7 % — ABNORMAL LOW (ref 36.0–46.0)
Hemoglobin: 8.2 g/dL — ABNORMAL LOW (ref 12.0–15.0)
Immature Granulocytes: 1 %
Lymphocytes Relative: 12 %
Lymphs Abs: 1.9 10*3/uL (ref 0.7–4.0)
MCH: 26.2 pg (ref 26.0–34.0)
MCHC: 31.9 g/dL (ref 30.0–36.0)
MCV: 82.1 fL (ref 80.0–100.0)
Monocytes Absolute: 1.3 10*3/uL — ABNORMAL HIGH (ref 0.1–1.0)
Monocytes Relative: 9 %
Neutro Abs: 11.6 10*3/uL — ABNORMAL HIGH (ref 1.7–7.7)
Neutrophils Relative %: 77 %
Platelets: 221 10*3/uL (ref 150–400)
RBC: 3.13 MIL/uL — ABNORMAL LOW (ref 3.87–5.11)
RDW: 16.4 % — ABNORMAL HIGH (ref 11.5–15.5)
WBC: 15.2 10*3/uL — ABNORMAL HIGH (ref 4.0–10.5)
nRBC: 0 % (ref 0.0–0.2)

## 2020-10-05 LAB — CULTURE, BLOOD (ROUTINE X 2): Culture: NO GROWTH

## 2020-10-05 MED ORDER — SODIUM CHLORIDE 0.9 % IV BOLUS
1000.0000 mL | Freq: Once | INTRAVENOUS | Status: AC
  Administered 2020-10-05: 1000 mL via INTRAVENOUS

## 2020-10-05 MED ORDER — SODIUM CHLORIDE 0.9 % IV SOLN
300.0000 mg | Freq: Three times a day (TID) | INTRAVENOUS | Status: AC
  Administered 2020-10-05 – 2020-10-06 (×2): 300 mg via INTRAVENOUS
  Filled 2020-10-05 (×2): qty 300

## 2020-10-05 NOTE — Progress Notes (Signed)
MEDICATION RELATED CONSULT NOTE  Pt is refusing Rifabutin PO capsule.  Pt last had Rifampin PO 6/2 at 0600.  Pt hs essentially been without Rifampin / Rifabutin for >48 hours.  RN is requesting restart of IV Rifampin.  To prevent further delay of care, will order Rifampin 300mg  IV q8h x 2 doses.  Rifampin is on national backorder and we have a very limited supply.  Will need to re-evaluate plan and discuss with ID in AM.  Marnie Fazzino, 10/05/2020,10:05 PM

## 2020-10-05 NOTE — Progress Notes (Signed)
   10/05/20 1657  Provider Notification  Provider Name/Title Gherghe  Time Provider Notified 1657  Notification Type Page  Notification Reason Change in status (patient in chronic tachycardia at 125)  Provider response See new orders

## 2020-10-05 NOTE — Progress Notes (Signed)
Spoke with Britta Mccreedy RN re PICC exchange from single lumen to double lumen.  Total of 4.5 hours of ABT IV per 24 hours plus Phenergan IV q6 hrs, 15 minutes each.  Single lumen PICC sufficient for current IV medications.  Britta Mccreedy RN agrees.  Secure chat with Dr Elvera Lennox, new order to cancel PICC exchange.

## 2020-10-05 NOTE — Plan of Care (Signed)
  Problem: Education: Goal: Knowledge of General Education information will improve Description Including pain rating scale, medication(s)/side effects and non-pharmacologic comfort measures Outcome: Progressing   

## 2020-10-05 NOTE — Plan of Care (Signed)
  Problem: Education: Goal: Knowledge of General Education information will improve Description: Including pain rating scale, medication(s)/side effects and non-pharmacologic comfort measures Outcome: Progressing   Problem: Clinical Measurements: Goal: Ability to maintain clinical measurements within normal limits will improve Outcome: Progressing   Problem: Clinical Measurements: Goal: Will remain free from infection Outcome: Progressing   Problem: Clinical Measurements: Goal: Diagnostic test results will improve Outcome: Progressing   Problem: Activity: Goal: Risk for activity intolerance will decrease Outcome: Progressing   Problem: Nutrition: Goal: Adequate nutrition will be maintained Outcome: Progressing   Problem: Coping: Goal: Level of anxiety will decrease Outcome: Progressing   Problem: Pain Managment: Goal: General experience of comfort will improve Outcome: Progressing   Problem: Safety: Goal: Ability to remain free from injury will improve Outcome: Progressing   

## 2020-10-05 NOTE — Progress Notes (Addendum)
Patient refused vital signs per NT Mel.

## 2020-10-05 NOTE — Progress Notes (Signed)
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  On 5/24 she had respiratory failure and had to be intubated, eventually extubated 5/29 and then when she woke up she decided to leave AMA and apparently came straight to [facility] ER.  ID was consulted  Subjective / 24h Interval events: No significant complaints, no chest pain, no shortness of breath  Assessment & Plan: Principal Problem Tricuspid valve endocarditis, sepsis, POA-initially admitted at [facility] but left AMA on 5/29.  She underwent a TEE on 5/26 which showed EF 60-65%, normal RV, and evidence of tricuspid valve endocarditis with a 1 cm vegetation, and mild to moderate tricuspid valve regurgitation. -Infectious disease consulted, appreciate input.  Current cultures are negative but she does have a history of MRSA/Serratia bacteremia.  Due to altered mental status she underwent an LP 5/27 which was unremarkable without concerns for CNS infection. -Continue vancomycin, cefepime, gentamicin and rifampin per ID -Monitor WBC and fever curve.  CT of her cervical, thoracic, lumbar spine done on 5/25 without acute abnormalities. -Doing well on antibiotics  Active Problems Anemia of chronic disease-hemoglobin stable this morning  Multifocal pneumonia-possibly with septic emboli, chest x-ray on admission shows improvement.  Continue antibiotics  Hypokalemia -repleted and continue to monitor  Polysubstance abuse/IVDU -patient very concerned about withdrawals.  She prefers buprenorphine versus  methadone.  Currently on 8 mg of buprenorphine, seems to be tolerating well  Liver cirrhosis, history of hep C -patient unaware of this diagnosis.  Monitor  Goals of care-palliative consulted while at [facility], remains full code/full scope.  No need to reinvolve palliative at this point  Scheduled Meds: . acidophilus  2 capsule Oral Daily  . buprenorphine  8 mg Sublingual Daily  . Chlorhexidine Gluconate Cloth  6 each Topical Daily  . cloNIDine  0.1 mg Oral BID  . rifabutin  300 mg Oral Daily  . sodium chloride flush  10-40 mL Intracatheter Q12H  . traZODone  50 mg Oral QHS   Continuous Infusions: . ceFEPime (MAXIPIME) IV 2 g (10/05/20 0520)  . gentamicin 50 mg (10/04/20 2345)  . promethazine (PHENERGAN) injection (IM or IVPB) Stopped (10/04/20 2344)  . vancomycin 1,250 mg (10/05/20 0907)   PRN Meds:.acetaminophen **OR** acetaminophen, alum & mag hydroxide-simeth, hydrOXYzine, promethazine (PHENERGAN) injection (IM or IVPB), sodium chloride flush  Diet Orders (From admission, onward)    Start     Ordered   10/04/20 1436  Diet regular Room service appropriate? Yes; Fluid consistency: Thin  Diet effective now       Question Answer Comment  Room service appropriate? Yes   Fluid consistency: Thin      10/04/20 1435          DVT prophylaxis: SCDs Start: 09/29/20 2058     Code Status: Full Code  Family Communication: no family at bedside   Status is: Inpatient because: Persistent severe electrolyte disturbances, IV treatments appropriate due to intensity of illness or inability to take PO and Inpatient level of care appropriate due to severity of illness  Dispo: The patient is from: Home  Anticipated d/c is to: Home              Patient currently is not medically stable to d/c.   Difficult to place patient No  Level of care: Telemetry Medical  Consultants:  ID  Procedures:  none  Microbiology  Blood cultures  -pending  Antimicrobials: Vancomycin Gentamicin Rifampin Cefepime   Objective: Vitals:   10/04/20 2349 10/05/20 0200 10/05/20 0300 10/05/20 0523  BP:  (!) 101/54 101/65   Pulse: (!) 115 (!) 109 94 99  Resp: 18 (!) 24 (!) 21 20  Temp:   98.9 F (37.2 C)   TempSrc:   Oral   SpO2: 99% 99% 100% 98%  Weight:      Height:        Intake/Output Summary (Last 24 hours) at 10/05/2020 1610 Last data filed at 10/04/2020 2100 Gross per 24 hour  Intake 360 ml  Output 700 ml  Net -340 ml   Filed Weights   09/30/20 0015  Weight: 64.1 kg    Examination:  Constitutional: NAD, in bed Eyes: No icterus ENMT: mmm Neck: normal, supple Respiratory: No wheezing, no crackles Cardiovascular: Regular rate and rhythm, no murmurs Abdomen: Nondistended, bowel sounds positive Musculoskeletal: no clubbing / cyanosis.  Skin: No rashes Neurologic: Nonfocal  Data Reviewed: I have independently reviewed following labs and imaging studies   CBC: Recent Labs  Lab 09/29/20 1843 10/01/20 0116 10/03/20 0610 10/04/20 0455 10/05/20 0049  WBC 24.3* 23.6* 20.1* 17.2* 15.2*  NEUTROABS 19.4* 18.6* 16.7* 14.3* 11.6*  HGB 8.4* 8.8* 8.3* 9.0* 8.2*  HCT 26.9* 26.4* 25.8* 28.4* 25.7*  MCV 82.3 80.7 82.7 82.8 82.1  PLT 206 214 294 275 221   Basic Metabolic Panel: Recent Labs  Lab 09/29/20 0533 09/29/20 1843 10/01/20 0116 10/02/20 2115 10/03/20 0610 10/04/20 0455 10/05/20 0049  NA 146*   < > 135 134* 134* 131* 131*  K 3.3*   < > 2.5* 2.8* 2.8* 3.4* 3.7  CL 120*   < > 95* 98 103 102 105  CO2 21*   < > 28 25 23 22  21*  GLUCOSE 147*   < > 85 92 92 152* 164*  BUN 22*   < > 8 10 7 11 12   CREATININE 0.62   < > 0.50 0.66 0.70 0.75 0.72  CALCIUM 7.2*   < > 8.0* 8.0* 8.1* 8.1* 8.2*  MG 2.3  --  1.6*  --   --  2.3  --   PHOS 3.5  --   --   --   --   --   --    < > = values in this interval not displayed.   Liver Function Tests: Recent Labs  Lab 09/29/20 1843 10/02/20 2115 10/03/20 0610  10/04/20 0455 10/05/20 0049  AST 23 19 14* 12* 13*  ALT 21 17 13 13 11   ALKPHOS 48 53 56 53 49  BILITOT 1.0 1.3* 0.8 0.5 0.1*  PROT 6.0* 6.4* 6.4* 7.1 6.5  ALBUMIN 2.5* 2.7* 2.7* 2.9* 2.7*   Coagulation Profile: No results for input(s): INR, PROTIME in the last 168 hours. HbA1C: No results for input(s): HGBA1C in the last 72 hours. CBG: Recent Labs  Lab 09/28/20 1944 09/28/20 2336 09/29/20 0400 09/29/20 0728 09/29/20 1116  GLUCAP 131* 134* 124* 137* 98    Recent Results (from the past 240 hour(s))  CSF culture w Gram Stain     Status: None   Collection Time: 09/27/20 10:34 AM  Specimen: PATH Cytology CSF; Cerebrospinal Fluid  Result Value Ref Range Status   Specimen Description   Final    CSF Performed at [facility], [address] [place], [place] [address]    Special Requests   Final    NONE Performed at [facility], [address] [place], [place] [address]    Gram Stain   Final    NO ORGANISMS SEEN WBC SEEN RED BLOOD CELLS PRESENT    Culture   Final    NO GROWTH 3 DAYS Performed at [facility], [address], [place], [place] [address]    Report Status 09/30/2020 FINAL  Final  Fungus Culture With Stain     Status: None (Preliminary result)   Collection Time: 09/27/20 10:34 AM   Specimen: PATH Cytology CSF; Cerebrospinal Fluid  Result Value Ref Range Status   Fungus Stain Final report  Final    Comment: (NOTE) Performed At: [facility] [address] [place], [place] [id number] Jolene Schimke MD EX:[id number]    Fungus (Mycology) Culture PENDING  Incomplete   Fungal Source CSF  Final    Comment: Performed at [facility], [address] [place], [place] [address]  Anaerobic culture w Gram Stain     Status: None   Collection Time: 09/27/20 10:34 AM   Specimen: PATH Cytology CSF; Cerebrospinal Fluid  Result Value Ref Range Status   Specimen Description   Final    CSF Performed at [facility], [address] [place], [place] [address]    Special Requests   Final    NONE Performed at [facility], [address] [place], [place] [address]    Gram Stain CYTOSPIN SMEAR NO WBC SEEN NO ORGANISMS SEEN   Final   Culture   Final    NO ANAEROBES ISOLATED Performed at [facility], [address], [place], [place] [address]    Report Status 10/02/2020 FINAL  Final  Fungus Culture Result     Status: None   Collection Time: 09/27/20 10:34 AM  Result Value Ref Range Status   Result 1 Comment  Final    Comment: (NOTE) KOH/Calcofluor preparation:  no fungus observed. Performed At: [facility] [address] [place], [place] [id number] Jolene Schimke MD VQ:[id number]   Blood culture (routine x 2)     Status: None   Collection Time: 09/29/20  8:20 PM   Specimen: Right Antecubital; Blood  Result Value Ref Range Status   Specimen Description RIGHT ANTECUBITAL  Final   Special Requests   Final    BOTTLES DRAWN AEROBIC AND ANAEROBIC Blood Culture adequate volume   Culture   Final    NO GROWTH 5 DAYS Performed at [facility], [address] [place], [place] [address]    Report Status 10/04/2020 FINAL  Final  Resp Panel by RT-PCR (Flu A&B, Covid) Nasopharyngeal Swab     Status: None   Collection Time: 09/29/20 10:17 PM   Specimen: Nasopharyngeal Swab; Nasopharyngeal(NP) swabs in vial transport medium  Result Value Ref Range Status   SARS Coronavirus 2 by RT PCR NEGATIVE NEGATIVE Final    Comment: (NOTE) SARS-CoV-2 target nucleic acids are NOT DETECTED.  The SARS-CoV-2 RNA is generally detectable in upper respiratory specimens during the acute phase of infection. The lowest concentration of SARS-CoV-2 viral copies this assay can detect is 138 copies/mL. A negative result does not preclude SARS-Cov-2 infection and should not be used as the  sole basis for treatment or other patient management decisions. A negative result may occur  with  improper specimen collection/handling, submission of specimen other than nasopharyngeal swab, presence of viral mutation(s) within the areas targeted by this assay, and inadequate number of viral copies(<138 copies/mL). A negative result must be combined with clinical observations, patient history, and epidemiological information. The expected result is Negative.  Fact Sheet for Patients:  BloggerCourse.com  Fact Sheet for Healthcare Providers:  SeriousBroker.it  This test is no t yet approved or cleared by the [place] FDA and  has been authorized for detection and/or diagnosis of SARS-CoV-2 by FDA under an Emergency Use Authorization (EUA). This EUA will remain  in effect (meaning this test can be used) for the duration of the COVID-19 declaration under Section 564(b)(1) of the Act, 21 U.S.C.section 360bbb-3(b)(1), unless the authorization is terminated  or revoked sooner.       Influenza A by PCR NEGATIVE NEGATIVE Final   Influenza B by PCR NEGATIVE NEGATIVE Final    Comment: (NOTE) The Xpert Xpress SARS-CoV-2/FLU/RSV plus assay is intended as an aid in the diagnosis of influenza from Nasopharyngeal swab specimens and should not be used as a sole basis for treatment. Nasal washings and aspirates are unacceptable for Xpert Xpress SARS-CoV-2/FLU/RSV testing.  Fact Sheet for Patients: BloggerCourse.com  Fact Sheet for Healthcare Providers: SeriousBroker.it  This test is not yet approved or cleared by the [place] FDA and has been authorized for detection and/or diagnosis of SARS-CoV-2 by FDA under an Emergency Use Authorization (EUA). This EUA will remain in effect (meaning this test can be used) for the duration of the COVID-19 declaration under Section 564(b)(1) of the Act, 21 U.S.C. section 360bbb-3(b)(1), unless the authorization is terminated  or revoked.  Performed at [facility], [address], [place], [place] [address]   MRSA PCR Screening     Status: None   Collection Time: 09/30/20  1:20 AM   Specimen: Nasal Mucosa; Nasopharyngeal  Result Value Ref Range Status   MRSA by PCR NEGATIVE NEGATIVE Final    Comment:        The GeneXpert MRSA Assay (FDA approved for NASAL specimens only), is one component of a comprehensive MRSA colonization surveillance program. It is not intended to diagnose MRSA infection nor to guide or monitor treatment for MRSA infections. Performed at [facility], [address] [place], [place] [address]   Blood culture (routine x 2)     Status: None (Preliminary result)   Collection Time: 09/30/20  3:17 AM   Specimen: BLOOD  Result Value Ref Range Status   Specimen Description BLOOD SITE NOT SPECIFIED  Final   Special Requests   Final    BOTTLES DRAWN AEROBIC ONLY Blood Culture results may not be optimal due to an inadequate volume of blood received in culture bottles   Culture   Final    NO GROWTH 4 DAYS Performed at [facility], [address], [place], [place] [address]    Report Status PENDING  Incomplete     Radiology Studies: No results found.  Pamella Pert, MD, PhD [facility]  Between 7 am - 7 pm I am available, please contact me via Amion (for emergencies) or Securechat (non urgent messages)  Between 7 pm - 7 am I am not available, please contact night coverage MD/APP via Amion

## 2020-10-06 MED ORDER — SODIUM CHLORIDE 0.9 % IV SOLN
300.0000 mg | Freq: Three times a day (TID) | INTRAVENOUS | Status: DC
  Administered 2020-10-06 – 2020-10-07 (×3): 300 mg via INTRAVENOUS
  Filled 2020-10-06 (×5): qty 300

## 2020-10-06 MED ORDER — METOPROLOL TARTRATE 12.5 MG HALF TABLET
12.5000 mg | ORAL_TABLET | Freq: Two times a day (BID) | ORAL | Status: DC
  Administered 2020-10-06 – 2020-10-11 (×11): 12.5 mg via ORAL
  Filled 2020-10-06 (×14): qty 1

## 2020-10-06 MED ORDER — KETOROLAC TROMETHAMINE 15 MG/ML IJ SOLN: 15.0000 mg | Freq: Four times a day (QID) | INTRAMUSCULAR | Status: AC | PRN

## 2020-10-06 NOTE — Progress Notes (Signed)
   10/06/20 1820  Assess: MEWS Score  Pulse Rate (!) 131  ECG Heart Rate (!) 132  Resp 17  Level of Consciousness Alert  SpO2 97 %  Patient Activity (if Appropriate) In chair  Assess: MEWS Score  MEWS Temp 0  MEWS Systolic 0  MEWS Pulse 3  MEWS RR 0  MEWS LOC 0  MEWS Score 3  MEWS Score Color Yellow  Assess: if the MEWS score is Yellow or Red  Were vital signs taken at a resting state? No  Focused Assessment No change from prior assessment  Early Detection of Sepsis Score *See Row Information* Low  MEWS guidelines implemented *See Row Information* No, previously yellow, continue vital signs every 4 hours  Notify: Provider  Provider Name/Title Gherghe  Date Provider Notified 10/06/20  Time Provider Notified 1820  Notification Type Page  Notification Reason Change in status  Provider response Evaluate remotely (Instructed to give 10 pm metoprolol dose now)  Date of Provider Response 10/06/20  Time of Provider Response 1822  Document  Patient Outcome Other (Comment) (stable)  Progress note created (see row info) Yes

## 2020-10-06 NOTE — Progress Notes (Signed)
Metoprolol given early per Dr Elvera Lennox

## 2020-10-06 NOTE — Progress Notes (Signed)
   10/06/20 1804  Assess: MEWS Score  Temp 98.8 F (37.1 C)  BP 105/86  Pulse Rate (!) 138  ECG Heart Rate (!) 138  Resp (!) 26  Level of Consciousness Alert  SpO2 97 %  O2 Device Room Air  Patient Activity (if Appropriate) In chair  Assess: if the MEWS score is Yellow or Red  Were vital signs taken at a resting state? No  Focused Assessment No change from prior assessment  Early Detection of Sepsis Score *See Row Information* Low  MEWS guidelines implemented *See Row Information* No, previously red, continue vital signs every 4 hours  Notify: Charge Nurse/RN  Name of Charge Nurse/RN Notified April

## 2020-10-06 NOTE — Plan of Care (Signed)

## 2020-10-06 NOTE — Progress Notes (Signed)
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  On 5/24 she had respiratory failure and had to be intubated, eventually extubated 5/29 and then when she woke up she decided to leave AMA and apparently came straight to [facility], ER.  ID was consulted  Subjective / 24h Interval events: Asking for more buprenorphine.  She wants me to give her 8 mg 3 times daily, patient claims this was promised by Dr. Benjamine Mola  Assessment & Plan: Principal Problem Tricuspid valve endocarditis, sepsis, POA-initially admitted at [facility] but left AMA on 5/29.  She underwent a TEE on 5/26 which showed EF 60-65%, normal RV, and evidence of tricuspid valve endocarditis with a 1 cm vegetation, and mild to moderate tricuspid valve regurgitation. -Infectious disease consulted, appreciate input.  Current cultures are negative but she does have a history of MRSA/Serratia bacteremia.  Due to altered mental status she underwent an LP 5/27 which was unremarkable without concerns for CNS infection. -Continue vancomycin, cefepime, gentamicin and rifampin per ID -Monitor WBC and fever curve.  CT of her cervical, thoracic, lumbar spine done on 5/25 without acute abnormalities. -Continue antibiotics, she has difficulties with rifampin.  Touch base with ID on Monday whether this can be changed  Active Problems Anemia of chronic disease-hemoglobin stable  Sinus tachycardia-patient states that this is unusual for her, normally her heart rate is on the low side as well as her blood pressure.  She  has soft blood pressures which can be caused by clonidine, will DC that as she is already on buprenorphine.  Add low-dose metoprolol  Multifocal pneumonia-possibly with septic emboli, chest x-ray on admission shows improvement.  Continue antibiotics  Hypokalemia -repleted and continue to monitor  Polysubstance abuse/IVDU -patient very concerned about withdrawals.  She prefers buprenorphine versus methadone.  Currently on 8 mg of buprenorphine, seems to be tolerating well  Liver cirrhosis, history of hep C -patient unaware of this diagnosis.  Monitor  Goals of care-palliative consulted while at [facility], remains full code/full scope.  No need to reinvolve palliative at this point  Scheduled Meds: . acidophilus  2 capsule Oral Daily  . buprenorphine  8 mg Sublingual Daily  . Chlorhexidine Gluconate Cloth  6 each Topical Daily  . metoprolol tartrate  12.5 mg Oral BID  . sodium chloride flush  10-40 mL Intracatheter Q12H  . traZODone  50 mg Oral QHS   Continuous Infusions: . ceFEPime (MAXIPIME) IV 2 g (10/06/20 0539)  . gentamicin 50 mg (10/05/20 2303)  . promethazine (PHENERGAN) injection (IM or IVPB) Stopped (10/04/20 2344)  . rifampin (RIFADIN) IVPB    . vancomycin 1,250 mg (10/06/20 0813)   PRN Meds:.acetaminophen **OR** acetaminophen, alum & mag hydroxide-simeth, hydrOXYzine, ketorolac, promethazine (PHENERGAN) injection (IM or IVPB), sodium chloride flush  Diet Orders (From admission, onward)    Start     Ordered   10/04/20 1436  Diet regular Room service appropriate? Yes; Fluid consistency: Thin  Diet effective now       Question Answer Comment  Room service appropriate? Yes   Fluid consistency: Thin  10/04/20 1435          DVT prophylaxis: SCDs Start: 09/29/20 2058     Code Status: Full Code  Family Communication: no family at bedside   Status is: Inpatient because: Persistent severe electrolyte disturbances, IV treatments appropriate due to intensity of  illness or inability to take PO and Inpatient level of care appropriate due to severity of illness  Dispo: The patient is from: Home              Anticipated d/c is to: Home              Patient currently is not medically stable to d/c.   Difficult to place patient No  Level of care: Telemetry Medical  Consultants:  ID  Procedures:  none  Microbiology  Blood cultures -pending  Antimicrobials: Vancomycin Gentamicin Rifampin Cefepime   Objective: Vitals:   10/05/20 1922 10/05/20 2123 10/05/20 2308 10/06/20 0300  BP: (!) 93/58 (!) 96/56 95/62 (!) 96/55  Pulse: (!) 123  (!) 109 (!) 117  Resp: 18  19 17   Temp: 98.5 F (36.9 C)  98.3 F (36.8 C) 99.2 F (37.3 C)  TempSrc: Oral  Oral Oral  SpO2: 99%  99% 99%  Weight:      Height:        Intake/Output Summary (Last 24 hours) at 10/06/2020 0926 Last data filed at 10/06/2020 0300 Gross per 24 hour  Intake 1961.6 ml  Output 1900 ml  Net 61.6 ml   Filed Weights   09/30/20 0015  Weight: 64.1 kg    Examination:  Constitutional: No distress Eyes: No icterus ENMT: mmm Neck: normal, supple Respiratory: No wheezing, no crackles Cardiovascular: Regular rate and rhythm, no murmurs, tachycardic Abdomen: Nondistended, bowel sounds positive Musculoskeletal: no clubbing / cyanosis.  Skin: No rashes seen Neurologic: No focal deficits  Data Reviewed: I have independently reviewed following labs and imaging studies   CBC: Recent Labs  Lab 09/29/20 1843 10/01/20 0116 10/03/20 0610 10/04/20 0455 10/05/20 0049  WBC 24.3* 23.6* 20.1* 17.2* 15.2*  NEUTROABS 19.4* 18.6* 16.7* 14.3* 11.6*  HGB 8.4* 8.8* 8.3* 9.0* 8.2*  HCT 26.9* 26.4* 25.8* 28.4* 25.7*  MCV 82.3 80.7 82.7 82.8 82.1  PLT 206 214 294 275 221   Basic Metabolic Panel: Recent Labs  Lab 10/01/20 0116 10/02/20 2115 10/03/20 0610 10/04/20 0455 10/05/20 0049  NA 135 134* 134* 131* 131*  K 2.5* 2.8* 2.8* 3.4* 3.7  CL 95* 98 103 102 105  CO2 28 25 23 22   21*  GLUCOSE 85 92 92 152* 164*  BUN 8 10 7 11 12   CREATININE 0.50 0.66 0.70 0.75 0.72  CALCIUM 8.0* 8.0* 8.1* 8.1* 8.2*  MG 1.6*  --   --  2.3  --    Liver Function Tests: Recent Labs  Lab 09/29/20 1843 10/02/20 2115 10/03/20 0610 10/04/20 0455 10/05/20 0049  AST 23 19 14* 12* 13*  ALT 21 17 13 13 11   ALKPHOS 48 53 56 53 49  BILITOT 1.0 1.3* 0.8 0.5 0.1*  PROT 6.0* 6.4* 6.4* 7.1 6.5  ALBUMIN 2.5* 2.7* 2.7* 2.9* 2.7*   Coagulation Profile: No results for input(s): INR, PROTIME in the last 168 hours. HbA1C: No results for input(s): HGBA1C in the last 72 hours. CBG: Recent Labs  Lab 09/29/20 1116  GLUCAP 98    Recent Results (from the past 240 hour(s))  CSF culture w Gram Stain     Status: None   Collection Time: 09/27/20  10:34 AM   Specimen: PATH Cytology CSF; Cerebrospinal Fluid  Result Value Ref Range Status   Specimen Description   Final    CSF Performed at [facility], [address] [place], [place] [address]    Special Requests   Final    NONE Performed at [facility], [address] [place], [place] [address]    Gram Stain   Final    NO ORGANISMS SEEN WBC SEEN RED BLOOD CELLS PRESENT    Culture   Final    NO GROWTH 3 DAYS Performed at [facility], [address] [place], [place] [address]    Report Status 09/30/2020 FINAL  Final  Fungus Culture With Stain     Status: None (Preliminary result)   Collection Time: 09/27/20 10:34 AM   Specimen: PATH Cytology CSF; Cerebrospinal Fluid  Result Value Ref Range Status   Fungus Stain Final report  Final    Comment: (NOTE) Performed At: [facility] [address] [place], [place] [id number] Jolene Schimke MD QQ:[id number]    Fungus (Mycology) Culture PENDING  Incomplete   Fungal Source CSF  Final    Comment: Performed at [facility], [address] [place], [place] [address]  Anaerobic culture w Gram Stain     Status: None   Collection Time: 09/27/20  10:34 AM   Specimen: PATH Cytology CSF; Cerebrospinal Fluid  Result Value Ref Range Status   Specimen Description   Final    CSF Performed at [facility], [address], [place], [place] [address]    Special Requests   Final    NONE Performed at [facility], [address] [place], [place] [address]    Gram Stain CYTOSPIN SMEAR NO WBC SEEN NO ORGANISMS SEEN   Final   Culture   Final    NO ANAEROBES ISOLATED Performed at [facility], [address] [place], [place] [address]    Report Status 10/02/2020 FINAL  Final  Fungus Culture Result     Status: None   Collection Time: 09/27/20 10:34 AM  Result Value Ref Range Status   Result 1 Comment  Final    Comment: (NOTE) KOH/Calcofluor preparation:  no fungus observed. Performed At: [facility] [address] [place], [place] [id number] Jolene Schimke MD FO:[id number]   Blood culture (routine x 2)     Status: None   Collection Time: 09/29/20  8:20 PM   Specimen: Right Antecubital; Blood  Result Value Ref Range Status   Specimen Description RIGHT ANTECUBITAL  Final   Special Requests   Final    BOTTLES DRAWN AEROBIC AND ANAEROBIC Blood Culture adequate volume   Culture   Final    NO GROWTH 5 DAYS Performed at [facility], [address] [place], [place] [address]    Report Status 10/04/2020 FINAL  Final  Resp Panel by RT-PCR (Flu A&B, Covid) Nasopharyngeal Swab     Status: None   Collection Time: 09/29/20 10:17 PM   Specimen: Nasopharyngeal Swab; Nasopharyngeal(NP) swabs in vial transport medium  Result Value Ref Range Status   SARS Coronavirus 2 by RT PCR NEGATIVE NEGATIVE Final    Comment: (NOTE) SARS-CoV-2 target nucleic acids are NOT DETECTED.  The SARS-CoV-2 RNA is generally detectable in upper respiratory specimens during the acute phase of infection. The lowest concentration of SARS-CoV-2 viral copies this assay can detect is 138 copies/mL. A negative result does not  preclude SARS-Cov-2 infection and should  not be used as the sole basis for treatment or other patient management decisions. A negative result may occur with  improper specimen collection/handling, submission of specimen other than nasopharyngeal swab, presence of viral mutation(s) within the areas targeted by this assay, and inadequate number of viral copies(<138 copies/mL). A negative result must be combined with clinical observations, patient history, and epidemiological information. The expected result is Negative.  Fact Sheet for Patients:  BloggerCourse.com  Fact Sheet for Healthcare Providers:  SeriousBroker.it  This test is no t yet approved or cleared by the [place] FDA and  has been authorized for detection and/or diagnosis of SARS-CoV-2 by FDA under an Emergency Use Authorization (EUA). This EUA will remain  in effect (meaning this test can be used) for the duration of the COVID-19 declaration under Section 564(b)(1) of the Act, 21 U.S.C.section 360bbb-3(b)(1), unless the authorization is terminated  or revoked sooner.       Influenza A by PCR NEGATIVE NEGATIVE Final   Influenza B by PCR NEGATIVE NEGATIVE Final    Comment: (NOTE) The Xpert Xpress SARS-CoV-2/FLU/RSV plus assay is intended as an aid in the diagnosis of influenza from Nasopharyngeal swab specimens and should not be used as a sole basis for treatment. Nasal washings and aspirates are unacceptable for Xpert Xpress SARS-CoV-2/FLU/RSV testing.  Fact Sheet for Patients: BloggerCourse.com  Fact Sheet for Healthcare Providers: SeriousBroker.it  This test is not yet approved or cleared by the [place] FDA and has been authorized for detection and/or diagnosis of SARS-CoV-2 by FDA under an Emergency Use Authorization (EUA). This EUA will remain in effect (meaning this test can be used) for the  duration of the COVID-19 declaration under Section 564(b)(1) of the Act, 21 U.S.C. section 360bbb-3(b)(1), unless the authorization is terminated or revoked.  Performed at [facility], [address], [place], [place] [address]   MRSA PCR Screening     Status: None   Collection Time: 09/30/20  1:20 AM   Specimen: Nasal Mucosa; Nasopharyngeal  Result Value Ref Range Status   MRSA by PCR NEGATIVE NEGATIVE Final    Comment:        The GeneXpert MRSA Assay (FDA approved for NASAL specimens only), is one component of a comprehensive MRSA colonization surveillance program. It is not intended to diagnose MRSA infection nor to guide or monitor treatment for MRSA infections. Performed at [facility], [address], [place], [place] [address]   Blood culture (routine x 2)     Status: None   Collection Time: 09/30/20  3:17 AM   Specimen: BLOOD  Result Value Ref Range Status   Specimen Description BLOOD SITE NOT SPECIFIED  Final   Special Requests   Final    BOTTLES DRAWN AEROBIC ONLY Blood Culture results may not be optimal due to an inadequate volume of blood received in culture bottles   Culture   Final    NO GROWTH 5 DAYS Performed at [facility], [address], [place], [place] [address]    Report Status 10/05/2020 FINAL  Final     Radiology Studies: No results found.  Pamella Pert, MD, PhD [facility]  Between 7 am - 7 pm I am available, please contact me via Amion (for emergencies) or Securechat (non urgent messages)  Between 7 pm - 7 am I am not available, please contact night coverage MD/APP via Amion

## 2020-10-07 MED ORDER — METOPROLOL TARTRATE 12.5 MG HALF TABLET
12.5000 mg | ORAL_TABLET | Freq: Once | ORAL | Status: AC
  Administered 2020-10-07: 12.5 mg via ORAL
  Filled 2020-10-07: qty 1

## 2020-10-07 MED ORDER — POLYVINYL ALCOHOL 1.4 % OP SOLN
1.0000 [drp] | OPHTHALMIC | Status: DC | PRN
  Filled 2020-10-07: qty 15

## 2020-10-07 MED ORDER — ARTIFICIAL TEARS OPHTHALMIC OINT
1.0000 "application " | TOPICAL_OINTMENT | Freq: Four times a day (QID) | OPHTHALMIC | Status: DC | PRN
  Filled 2020-10-07: qty 3.5

## 2020-10-07 NOTE — Progress Notes (Signed)
Pharmacy Antibiotic Note  LATRAVIA SOUTHGATE is a 45 y.o. female admitted on 09/29/2020 with tricuspid valve endocarditis. 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 Noted history of MRSA/serratia in the past so will modify antibiotics to vanc/cefepime/gent. Pharmacy has been consulted for vancomycin, cefepime and gentamicin dosing.   Scr today is stable at 0.72 and CrCl ~ 77 ml/min.    Plan: - Vancomycin 1250 mg every 24 hours  - Plan repeat levels Wed/Thurs - Gentamicin 50 mg every 12 hours - End 6/10 - Cefepime 2 gm IV q 8 hours - Hold rifampin for now given oral intolerance and limited supply of IV formulation  - Monitor renal function closely    Height: 5\' 2"  (157.5 cm) Weight: 64.1 kg (141 lb 5 oz) IBW/kg (Calculated) : 50.1  Temp (24hrs), Avg:98.5 F (36.9 C), Min:98 F (36.7 C), Max:99 F (37.2 C)  Recent Labs  Lab 10/01/20 0116 10/02/20 2100 10/02/20 2115 10/03/20 0610 10/04/20 0455 10/04/20 1000 10/04/20 1420 10/05/20 0049  WBC 23.6*  --   --  20.1* 17.2*  --   --  15.2*  CREATININE 0.50  --  0.66 0.70 0.75  --   --  0.72  VANCOTROUGH  --   --   --  15  --   --   --   --   VANCOPEAK  --  37  --   --   --   --   --   --   GENTTROUGH  --   --   --   --   --  0.9  --   --   GENTPEAK  --   --   --   --   --   --  5.8  --     Estimated Creatinine Clearance: 77.3 mL/min (by C-G formula based on SCr of 0.72 mg/dL).    No Known Allergies  Antimicrobials this admission:  5/27 gent>> 6/10 5/27 vanc>> 7/8 5/29 cefepime>> 7/8 5/30 Rifampin>>Hold 6/6   Dose adjustments this admission:  5/29 Gent Trough @0533 = 0.8 mcg/ml 5/29 Gent Peak@0822 = 2.2 mcg/ml  5/27 VT= 13; dose increased to 750mg  IV q12h 6/1-6/2; VP = 37; VT = 15 on 750 q 12 hrs - chg to 1250 q 24 hrs. 6/3: Gent trough = 0.9 , gent peak = 5.8 >>Reduce to 50 q 12   Microbiology results:  5/30 blood x2- ngtd 5/29 blood x3- ngtd 5/27 csf- neg   6/30,  PharmD, BCPS, BCIDP Infectious Diseases Clinical Pharmacist Phone: [phone]  10/07/2020 10:24 AM   [facility] pharmacy phone numbers are listed on [email or web address]

## 2020-10-07 NOTE — Progress Notes (Signed)
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  On 5/24 she had respiratory failure and had to be intubated, eventually extubated 5/29 and then when she woke up she decided to leave AMA and apparently came straight to [facility].  ID was consulted.  Subjective / 24h Interval events: Says she is going back to school Patient also reports being homeless  Assessment & Plan:  Tricuspid valve endocarditis, sepsis, POA -initially admitted at [facility] but left AMA on 5/29. - underwent a TEE on 5/26 which showed EF 60-65%, normal RV, and evidence of tricuspid valve endocarditis with a 1 cm vegetation, and mild to moderate tricuspid valve regurgitation. -Infectious disease consulted, appreciate input.  Current cultures are negative but she does have a history of MRSA/Serratia bacteremia.  Due to altered mental status she underwent an LP 5/27 which was unremarkable without concerns for CNS infection. -Continue abx per ID -Monitor WBC and fever curve.  Anemia of chronic disease -hemoglobin stable  Multifocal pneumonia-possibly with septic emboli, chest x-ray on admission shows improvement.  Continue antibiotics  Hypokalemia/hypomagnesemia -ongoing aggressive repletion needed  Polysubstance abuse/IVDU -patient very concerned about withdrawals.  She prefers buprenorphine versus methadone.  -adjust as able -tele sitter if needed as there were reports of erratic behavior by family  Liver cirrhosis, history of hep C  -outpatient follow up  Cervical neck  pain -CT scan from 5/22: Postoperative changes from prior anterior fusion at C3 through C7, with posterior fusion at C2 through T2. Periprosthetic lucency about the transpedicular screws on the left at C4 and bilaterally at T1 and T2, suggestive of loosening. -[facility] so will need to follow up there - no neurologic deficits   Goals of care-palliative consulted while at [facility], remains full code/full scope.  No need to reinvolve palliative at this point  Scheduled Meds: . acidophilus  2 capsule Oral Daily  . buprenorphine  8 mg Sublingual Daily  . Chlorhexidine Gluconate Cloth  6 each Topical Daily  . metoprolol tartrate  12.5 mg Oral BID  . sodium chloride flush  10-40 mL Intracatheter Q12H  . traZODone  50 mg Oral QHS   Continuous Infusions: . ceFEPime (MAXIPIME) IV 2 g (10/07/20 1344)  . gentamicin 50 mg (10/07/20 1110)  . promethazine (PHENERGAN) injection (IM or IVPB) Stopped (10/04/20 2344)  . vancomycin 1,250 mg (10/07/20 0739)   PRN Meds:.acetaminophen **OR** acetaminophen, alum & mag hydroxide-simeth, hydrOXYzine, ketorolac, promethazine (PHENERGAN) injection (IM or IVPB), sodium chloride flush  Diet Orders (From admission, onward)    Start     Ordered   10/04/20 1436  Diet regular Room service appropriate? Yes; Fluid consistency: Thin  Diet effective now       Question Answer Comment  Room service appropriate? Yes   Fluid consistency: Thin      10/04/20 1435          DVT prophylaxis: SCDs Start: 09/29/20 2058     Code Status: Full Code  Family Communication: brother at bedside   Status is: Inpatient because: Persistent severe electrolyte disturbances, IV treatments appropriate due  to intensity of illness or inability to take PO and Inpatient level of care appropriate due to severity of illness  Dispo: The patient is from: Home              Anticipated d/c is to: Home              Patient currently is not medically stable to d/c.   Difficult  to place patient No  Level of care: Telemetry Medical  Consultants:  ID    Objective: Vitals:   10/07/20 1000 10/07/20 1100 10/07/20 1107 10/07/20 1200  BP:   102/69   Pulse:   (!) 108   Resp: (!) 21 17 15 18   Temp:   98.5 F (36.9 C)   TempSrc:   Oral   SpO2:   99%   Weight:      Height:        Intake/Output Summary (Last 24 hours) at 10/07/2020 1400 Last data filed at 10/07/2020 1000 Gross per 24 hour  Intake 930 ml  Output 2200 ml  Net -1270 ml   Filed Weights   09/30/20 0015  Weight: 64.1 kg    Examination:  General: Appearance:     Overweight female in no acute distress     Lungs:     respirations unlabored  Heart:    Tachycardic. Normal rhythm. No murmurs, rubs, or gallops.   MS:   All extremities are intact.   Neurologic:   Awake, alert, oriented x 3. No apparent focal neurological           defect.     Data Reviewed: I have independently reviewed following labs and imaging studies   CBC: Recent Labs  Lab 10/01/20 0116 10/03/20 0610 10/04/20 0455 10/05/20 0049  WBC 23.6* 20.1* 17.2* 15.2*  NEUTROABS 18.6* 16.7* 14.3* 11.6*  HGB 8.8* 8.3* 9.0* 8.2*  HCT 26.4* 25.8* 28.4* 25.7*  MCV 80.7 82.7 82.8 82.1  PLT 214 294 275 221   Basic Metabolic Panel: Recent Labs  Lab 10/01/20 0116 10/02/20 2115 10/03/20 0610 10/04/20 0455 10/05/20 0049  NA 135 134* 134* 131* 131*  K 2.5* 2.8* 2.8* 3.4* 3.7  CL 95* 98 103 102 105  CO2 28 25 23 22  21*  GLUCOSE 85 92 92 152* 164*  BUN 8 10 7 11 12   CREATININE 0.50 0.66 0.70 0.75 0.72  CALCIUM 8.0* 8.0* 8.1* 8.1* 8.2*  MG 1.6*  --   --  2.3  --    Liver Function Tests: Recent Labs  Lab 10/02/20 2115 10/03/20 0610 10/04/20 0455 10/05/20 0049  AST 19 14* 12* 13*  ALT 17 13 13 11   ALKPHOS 53 56 53 49  BILITOT 1.3* 0.8 0.5 0.1*  PROT 6.4* 6.4* 7.1 6.5  ALBUMIN 2.7* 2.7* 2.9* 2.7*   Coagulation Profile: No results for input(s): INR, PROTIME in the last 168 hours. HbA1C: No results for input(s):  HGBA1C in the last 72 hours. CBG: No results for input(s): GLUCAP in the last 168 hours.  Recent Results (from the past 240 hour(s))  Blood culture (routine x 2)     Status: None   Collection Time: 09/29/20  8:20 PM   Specimen: Right Antecubital; Blood  Result Value Ref Range Status   Specimen Description RIGHT ANTECUBITAL  Final   Special Requests   Final    BOTTLES DRAWN AEROBIC AND ANAEROBIC Blood Culture adequate volume   Culture   Final    NO GROWTH 5 DAYS Performed at [facility], [address] [place], [place] [address]    Report Status 10/04/2020 FINAL  Final  Resp Panel by RT-PCR (Flu A&B, Covid) Nasopharyngeal Swab     Status: None   Collection Time: 09/29/20 10:17 PM   Specimen: Nasopharyngeal Swab; Nasopharyngeal(NP) swabs in vial transport medium  Result Value Ref Range Status   SARS Coronavirus 2 by RT PCR NEGATIVE NEGATIVE Final    Comment: (NOTE) SARS-CoV-2 target nucleic acids are NOT DETECTED.  The SARS-CoV-2 RNA is generally detectable in upper respiratory specimens during the acute phase of infection. The lowest concentration of SARS-CoV-2 viral copies this assay can detect is 138 copies/mL. A negative result does not preclude SARS-Cov-2 infection and should not be used as the sole basis for treatment or other patient management decisions. A negative result may occur with  improper specimen collection/handling, submission of specimen other than nasopharyngeal swab, presence of viral mutation(s) within the areas targeted by this assay, and inadequate number of viral copies(<138 copies/mL). A negative result must be combined with clinical observations, patient history, and epidemiological information. The expected result is Negative.  Fact Sheet for Patients:  BloggerCourse.com  Fact Sheet for Healthcare Providers:  SeriousBroker.it  This test is no t yet approved or cleared by the [place]  FDA and  has been authorized for detection and/or diagnosis of SARS-CoV-2 by FDA under an Emergency Use Authorization (EUA). This EUA will remain  in effect (meaning this test can be used) for the duration of the COVID-19 declaration under Section 564(b)(1) of the Act, 21 U.S.C.section 360bbb-3(b)(1), unless the authorization is terminated  or revoked sooner.       Influenza A by PCR NEGATIVE NEGATIVE Final   Influenza B by PCR NEGATIVE NEGATIVE Final    Comment: (NOTE) The Xpert Xpress SARS-CoV-2/FLU/RSV plus assay is intended as an aid in the diagnosis of influenza from Nasopharyngeal swab specimens and should not be used as a sole basis for treatment. Nasal washings and aspirates are unacceptable for Xpert Xpress SARS-CoV-2/FLU/RSV testing.  Fact Sheet for Patients: BloggerCourse.com  Fact Sheet for Healthcare Providers: SeriousBroker.it  This test is not yet approved or cleared by the [place] FDA and has been authorized for detection and/or diagnosis of SARS-CoV-2 by FDA under an Emergency Use Authorization (EUA). This EUA will remain in effect (meaning this test can be used) for the duration of the COVID-19 declaration under Section 564(b)(1) of the Act, 21 U.S.C. section 360bbb-3(b)(1), unless the authorization is terminated or revoked.  Performed at [facility], [address], [place], [place] [address]   MRSA PCR Screening     Status: None   Collection Time: 09/30/20  1:20 AM   Specimen: Nasal Mucosa; Nasopharyngeal  Result Value Ref Range Status   MRSA by PCR NEGATIVE NEGATIVE Final    Comment:        The GeneXpert MRSA Assay (FDA approved for NASAL specimens only), is one component of a comprehensive MRSA colonization surveillance program. It is not intended to diagnose MRSA infection nor to guide or monitor treatment for MRSA infections. Performed at [facility], [address]  [place], [place] [address]   Blood culture (routine x 2)     Status: None   Collection Time: 09/30/20  3:17 AM   Specimen: BLOOD  Result Value Ref Range Status   Specimen Description BLOOD SITE NOT SPECIFIED  Final   Special Requests   Final    BOTTLES DRAWN AEROBIC ONLY Blood  Culture results may not be optimal due to an inadequate volume of blood received in culture bottles   Culture   Final    NO GROWTH 5 DAYS Performed at [facility], [address], [place], [place] [address]    Report Status 10/05/2020 FINAL  Final     Radiology Studies: No results found.  Marlin Canary DO [facility]  Between 7 am - 7 pm I am available, please contact me via Amion (for emergencies) or Securechat (non urgent messages)  Between 7 pm - 7 am I am not available, please contact night coverage MD/APP via Amion

## 2020-10-07 NOTE — Progress Notes (Signed)
[facility]   Reason for visit: Follow up on prosthetic tricuspid valve endocarditis  Interval History: no new complaints, feels she has passed withdrawal and pleased with that.   Remains afebrile Blood cultures: 5/30 1 set ngtd; 5/29 1 set ngtd; 5/23 2 sets no growth Trach aspirate: C tropicales Vancomycin 5/24 > ongoing Cefepime 5/27 > ongoing Gentamicin 5/27 > ongoing, to finish 6/10  Physical Exam: Constitutional:  Vitals:   10/07/20 0750 10/07/20 0800  BP: 106/69   Pulse: (!) 113   Resp: 15 16  Temp: 98.4 F (36.9 C)   SpO2: 99%    patient appears in NAD Respiratory: Normal respiratory effort; CTA B Cardiovascular: RRR + SEM GI: soft, nt, nd  Review of Systems: Constitutional: negative for fevers and chills Gastrointestinal: negative for diarrhea Integument/breast: negative for rash  Lab Results  Component Value Date   WBC 15.2 (H) 10/05/2020   HGB 8.2 (L) 10/05/2020   HCT 25.7 (L) 10/05/2020   MCV 82.1 10/05/2020   PLT 221 10/05/2020    Lab Results  Component Value Date   CREATININE 0.72 10/05/2020   BUN 12 10/05/2020   NA 131 (L) 10/05/2020   K 3.7 10/05/2020   CL 105 10/05/2020   CO2 21 (L) 10/05/2020    Lab Results  Component Value Date   ALT 11 10/05/2020   AST 13 (L) 10/05/2020   ALKPHOS 49 10/05/2020     Microbiology: Recent Results (from the past 240 hour(s))  CSF culture w Gram Stain     Status: None   Collection Time: 09/27/20 10:34 AM   Specimen: PATH Cytology CSF; Cerebrospinal Fluid  Result Value Ref Range Status   Specimen Description   Final    CSF Performed at [facility], [address], [place], [place] [address]    Special Requests   Final    NONE Performed at [facility], [address] [place], [place] [address]    Gram Stain   Final    NO ORGANISMS SEEN WBC SEEN RED BLOOD CELLS PRESENT    Culture   Final    NO GROWTH 3 DAYS Performed at [facility], [address], [place], [place] [address]    Report Status 09/30/2020 FINAL  Final  Fungus Culture With Stain     Status: None (Preliminary result)   Collection Time: 09/27/20 10:34 AM   Specimen: PATH Cytology CSF; Cerebrospinal Fluid  Result Value Ref Range Status   Fungus Stain Final report  Final    Comment: (NOTE) Performed At: [facility] [address] [place], [place] [id number] Jolene Schimke MD [id number]:[id number]    Fungus (Mycology) Culture PENDING  Incomplete   Fungal Source CSF  Final    Comment: Performed at [facility], [address] [place], [place] [address]  Anaerobic culture w Gram Stain     Status: None   Collection Time: 09/27/20 10:34 AM   Specimen: PATH Cytology CSF; Cerebrospinal Fluid  Result Value Ref Range Status   Specimen Description   Final    CSF Performed at [facility], [address], [place], [place] [address]    Special Requests   Final    NONE Performed at [facility], [address] [place], [place] [address]    Gram Stain CYTOSPIN SMEAR NO WBC SEEN NO ORGANISMS SEEN   Final   Culture   Final    NO ANAEROBES ISOLATED Performed at  [facility], [address], [place], [place] [address]    Report Status 10/02/2020 FINAL  Final  Fungus Culture Result     Status: None   Collection Time: 09/27/20 10:34 AM  Result Value Ref Range Status   Result 1 Comment  Final    Comment: (NOTE) KOH/Calcofluor preparation:  no fungus observed. Performed At: [facility] [address] [place], [place] [id number] Jolene Schimke MD ZR:[id number]   Blood culture (routine x 2)     Status: None   Collection Time: 09/29/20  8:20 PM   Specimen: Right Antecubital; Blood  Result Value Ref Range Status   Specimen Description RIGHT ANTECUBITAL  Final   Special Requests   Final    BOTTLES DRAWN AEROBIC AND ANAEROBIC Blood Culture adequate volume   Culture   Final    NO GROWTH 5 DAYS Performed at [facility], [address], [place], [place] [address]    Report Status 10/04/2020 FINAL  Final  Resp Panel by RT-PCR (Flu A&B, Covid) Nasopharyngeal Swab     Status: None   Collection Time: 09/29/20 10:17 PM   Specimen: Nasopharyngeal Swab; Nasopharyngeal(NP) swabs in vial transport medium  Result Value Ref Range Status   SARS Coronavirus 2 by RT PCR NEGATIVE NEGATIVE Final    Comment: (NOTE) SARS-CoV-2 target nucleic acids are NOT DETECTED.  The SARS-CoV-2 RNA is generally detectable in upper respiratory specimens during the acute phase of infection. The lowest concentration of SARS-CoV-2 viral copies this assay can detect is 138 copies/mL. A negative result does not preclude SARS-Cov-2 infection and should not be used as the sole basis for treatment or other patient management decisions. A negative result may occur with  improper specimen collection/handling, submission of specimen other than nasopharyngeal swab, presence of viral mutation(s) within the areas targeted by this assay, and inadequate number of viral copies(<138 copies/mL). A negative result must be combined with clinical observations, patient history, and epidemiological information. The expected result is Negative.  Fact Sheet for Patients:  BloggerCourse.com  Fact Sheet for Healthcare Providers:  SeriousBroker.it  This test is no t yet approved or cleared by the [place] FDA and  has been authorized for detection and/or diagnosis of SARS-CoV-2 by FDA under an Emergency Use Authorization (EUA). This EUA will remain  in effect (meaning this test can be used) for the duration of the COVID-19 declaration under Section 564(b)(1) of the Act, 21 U.S.C.section 360bbb-3(b)(1), unless the authorization is terminated  or revoked sooner.       Influenza A by PCR NEGATIVE NEGATIVE Final   Influenza B by PCR NEGATIVE NEGATIVE Final    Comment: (NOTE) The Xpert  Xpress SARS-CoV-2/FLU/RSV plus assay is intended as an aid in the diagnosis of influenza from Nasopharyngeal swab specimens and should not be used as a sole basis for treatment. Nasal washings and aspirates are unacceptable for Xpert Xpress SARS-CoV-2/FLU/RSV testing.  Fact Sheet for Patients: BloggerCourse.com  Fact Sheet for Healthcare Providers: SeriousBroker.it  This test is not yet approved or cleared by the [place] FDA and has been authorized for detection and/or diagnosis of SARS-CoV-2 by FDA under an Emergency Use Authorization (EUA). This EUA will remain in effect (meaning this test can be used) for the duration of the COVID-19 declaration under Section 564(b)(1) of the Act, 21 U.S.C. section 360bbb-3(b)(1), unless the authorization is terminated or revoked.  Performed at [facility], [address], [place], [place] [address]   MRSA PCR  Screening     Status: None   Collection Time: 09/30/20  1:20 AM   Specimen: Nasal Mucosa; Nasopharyngeal  Result Value Ref Range Status   MRSA by PCR NEGATIVE NEGATIVE Final    Comment:        The GeneXpert MRSA Assay (FDA approved for NASAL specimens only), is one component of a comprehensive MRSA colonization surveillance program. It is not intended to diagnose MRSA infection nor to guide or monitor treatment for MRSA infections. Performed at [facility], [address], [place], [place] [address]   Blood culture (routine x 2)     Status: None   Collection Time: 09/30/20  3:17 AM   Specimen: BLOOD  Result Value Ref Range Status   Specimen Description BLOOD SITE NOT SPECIFIED  Final   Special Requests   Final    BOTTLES DRAWN AEROBIC ONLY Blood Culture results may not be optimal due to an inadequate volume of blood received in culture bottles   Culture   Final    NO GROWTH 5 DAYS Performed at [facility], [address], [place], [place] [address]     Report Status 10/05/2020 FINAL  Final    Impression/Plan:  1. 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  I do not know what she has now, certainly Staph aureus is possible but will continue with broad treatment for 6 weeks with vancomycin, cefepime and synergistic gentamicin.   Refusing rifampin The issue will be after treatment completion, with a prosthetic valve she will need suppressive antibiotics for treatment and with a negative culture, it is impossible to know what to suppress.  Will consider doxycycline and Augmentin long term after completion of above  2.  Therapeutic drug monitoring - she will need continued frequent blood draws on vancomycin and gent.    3.  Neck pain - she feels something moving at the site of her previous surgery but clinically with no warmth, no erythema and no tenderness.  Continue with supportive care.    Will continue to follow intermittently

## 2020-10-07 NOTE — Plan of Care (Signed)

## 2020-10-08 NOTE — Plan of Care (Signed)
  Problem: Education: Goal: Knowledge of General Education information will improve Description: Including pain rating scale, medication(s)/side effects and non-pharmacologic comfort measures Outcome: Progressing   Problem: Health Behavior/Discharge Planning: Goal: Ability to manage health-related needs will improve Outcome: Progressing   Problem: Clinical Measurements: Goal: Ability to maintain clinical measurements within normal limits will improve Outcome: Progressing   Problem: Clinical Measurements: Goal: Diagnostic test results will improve Outcome: Progressing   Problem: Clinical Measurements: Goal: Cardiovascular complication will be avoided Outcome: Progressing   Problem: Activity: Goal: Risk for activity intolerance will decrease Outcome: Progressing   Problem: Nutrition: Goal: Adequate nutrition will be maintained Outcome: Progressing   Problem: Coping: Goal: Level of anxiety will decrease Outcome: Progressing   Problem: Pain Managment: Goal: General experience of comfort will improve Outcome: Progressing

## 2020-10-08 NOTE — Progress Notes (Signed)
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  On 5/24 she had respiratory failure and had to be intubated, eventually extubated 5/29 and then when she woke up she decided to leave AMA and apparently came straight to [facility], ER.  ID was consulted.   Subjective / 24h Interval events: Asking for a letter to her lawyer about her being hospitalized  Assessment & Plan:  Tricuspid valve endocarditis, sepsis, POA -initially admitted at [facility] regional but left AMA on 5/29. - underwent a TEE on 5/26 which showed EF 60-65%, normal RV, and evidence of tricuspid valve endocarditis with a 1 cm vegetation, and mild to moderate tricuspid valve regurgitation. -Infectious disease consulted, appreciate input.  Current cultures are negative but she does have a history of MRSA/Serratia bacteremia.  Due to altered mental status she underwent an LP 5/27 which was unremarkable without concerns for CNS infection. -Continue abx per ID -Monitor WBC and fever curve.  Anemia of chronic disease -hemoglobin stable  Multifocal pneumonia-possibly with septic emboli, chest x-ray on admission shows improvement.  Continue antibiotics  Hypokalemia/hypomagnesemia -ongoing aggressive repletion needed  Polysubstance abuse/IVDU -patient very concerned about withdrawals.  She prefers buprenorphine versus methadone.  -adjust if needed -tele sitter if needed as there were reports of erratic behavior by family when patient first was admitted  Liver cirrhosis, history of hep C   -outpatient follow up  Cervical neck pain -CT scan from 5/22: Postoperative changes from prior anterior fusion at C3 through C7, with posterior fusion at C2 through T2. Periprosthetic lucency about the transpedicular screws on the left at C4 and bilaterally at T1 and T2, suggestive of loosening. -surgery was at [facility] so will need to follow up there - no neurologic deficits   Goals of care-palliative consulted while at [facility], remains full code/full scope.  No need to reinvolve palliative at this point  Scheduled Meds: . acidophilus  2 capsule Oral Daily  . buprenorphine  8 mg Sublingual Daily  . Chlorhexidine Gluconate Cloth  6 each Topical Daily  . metoprolol tartrate  12.5 mg Oral BID  . sodium chloride flush  10-40 mL Intracatheter Q12H  . traZODone  50 mg Oral QHS   Continuous Infusions: . ceFEPime (MAXIPIME) IV 2 g (10/08/20 0503)  . gentamicin 50 mg (10/08/20 0039)  . promethazine (PHENERGAN) injection (IM or IVPB) Stopped (10/04/20 2344)  . vancomycin 1,250 mg (10/08/20 0731)   PRN Meds:.acetaminophen **OR** acetaminophen, alum & mag hydroxide-simeth, hydrOXYzine, ketorolac, polyvinyl alcohol, promethazine (PHENERGAN) injection (IM or IVPB), sodium chloride flush  Diet Orders (From admission, onward)    Start     Ordered   10/04/20 1436  Diet regular Room service appropriate? Yes; Fluid consistency: Thin  Diet effective now       Question Answer Comment  Room service appropriate? Yes   Fluid consistency: Thin      10/04/20 1435          DVT prophylaxis: SCDs Start: 09/29/20 2058     Code Status: Full Code  Family Communication: brother at bedside   Status is: Inpatient  because: Persistent severe electrolyte disturbances, IV treatments appropriate due to intensity of illness or inability to take PO and Inpatient level of care appropriate due to severity of illness  Dispo: The patient is from: Home              Anticipated d/c is to: Home               Patient currently is not medically stable to d/c. abx through July 8th   Difficult to place patient yes  Level of care: Telemetry Medical  Consultants:  ID    Objective: Vitals:   10/08/20 0600 10/08/20 0700 10/08/20 0800 10/08/20 0803  BP:   (!) 145/124 (!) 110/59  Pulse:   (!) 138   Resp: 15 15 15 17   Temp:    99.8 F (37.7 C)  TempSrc:      SpO2:      Weight:      Height:        Intake/Output Summary (Last 24 hours) at 10/08/2020 1101 Last data filed at 10/07/2020 1700 Gross per 24 hour  Intake 1503.9 ml  Output 800 ml  Net 703.9 ml   Filed Weights   09/30/20 0015  Weight: 64.1 kg    Examination:   General: Appearance:     Overweight female in no acute distress     Lungs:     respirations unlabored  Heart:    Tachycardic. Normal rhythm. No murmurs, rubs, or gallops.   MS:   All extremities are intact.   Neurologic:   Awake, alert, oriented x 3. No apparent focal neurological           defect.     Data Reviewed: I have independently reviewed following labs and imaging studies   CBC: Recent Labs  Lab 10/03/20 0610 10/04/20 0455 10/05/20 0049  WBC 20.1* 17.2* 15.2*  NEUTROABS 16.7* 14.3* 11.6*  HGB 8.3* 9.0* 8.2*  HCT 25.8* 28.4* 25.7*  MCV 82.7 82.8 82.1  PLT 294 275 221   Basic Metabolic Panel: Recent Labs  Lab 10/02/20 2115 10/03/20 0610 10/04/20 0455 10/05/20 0049  NA 134* 134* 131* 131*  K 2.8* 2.8* 3.4* 3.7  CL 98 103 102 105  CO2 25 23 22  21*  GLUCOSE 92 92 152* 164*  BUN 10 7 11 12   CREATININE 0.66 0.70 0.75 0.72  CALCIUM 8.0* 8.1* 8.1* 8.2*  MG  --   --  2.3  --    Liver Function Tests: Recent Labs  Lab 10/02/20 2115 10/03/20 0610 10/04/20 0455 10/05/20 0049  AST 19 14* 12* 13*  ALT 17 13 13 11   ALKPHOS 53 56 53 49  BILITOT 1.3* 0.8 0.5 0.1*  PROT 6.4* 6.4* 7.1 6.5  ALBUMIN 2.7* 2.7* 2.9* 2.7*   Coagulation Profile: No results for input(s): INR, PROTIME in the last 168 hours. HbA1C: No results for input(s): HGBA1C  in the last 72 hours. CBG: No results for input(s): GLUCAP in the last 168 hours.  Recent Results (from the past 240 hour(s))  Blood culture (routine x 2)     Status: None   Collection Time: 09/29/20  8:20 PM   Specimen: Right Antecubital; Blood  Result Value Ref Range Status   Specimen Description RIGHT ANTECUBITAL  Final   Special Requests   Final    BOTTLES DRAWN AEROBIC AND ANAEROBIC Blood Culture adequate volume   Culture   Final    NO GROWTH 5 DAYS Performed at [facility], [address] [place], [place] [address]    Report Status 10/04/2020 FINAL  Final  Resp Panel by RT-PCR (Flu A&B, Covid) Nasopharyngeal Swab     Status: None   Collection Time: 09/29/20 10:17 PM   Specimen: Nasopharyngeal Swab; Nasopharyngeal(NP) swabs in vial transport medium  Result Value Ref Range Status   SARS Coronavirus 2 by RT PCR NEGATIVE NEGATIVE Final    Comment: (NOTE) SARS-CoV-2 target nucleic acids are NOT DETECTED.  The SARS-CoV-2 RNA is generally detectable in upper respiratory specimens during the acute phase of infection. The lowest concentration of SARS-CoV-2 viral copies this assay can detect is 138 copies/mL. A negative result does not preclude SARS-Cov-2 infection and should not be used as the sole basis for treatment or other patient management decisions. A negative result may occur with  improper specimen collection/handling, submission of specimen other than nasopharyngeal swab, presence of viral mutation(s) within the areas targeted by this assay, and inadequate number of viral copies(<138 copies/mL). A negative result must be combined with clinical observations, patient history, and epidemiological information. The expected result is Negative.  Fact Sheet for Patients:  BloggerCourse.com  Fact Sheet for Healthcare Providers:  SeriousBroker.it  This test is no t yet approved or cleared by the [place] FDA and   has been authorized for detection and/or diagnosis of SARS-CoV-2 by FDA under an Emergency Use Authorization (EUA). This EUA will remain  in effect (meaning this test can be used) for the duration of the COVID-19 declaration under Section 564(b)(1) of the Act, 21 U.S.C.section 360bbb-3(b)(1), unless the authorization is terminated  or revoked sooner.       Influenza A by PCR NEGATIVE NEGATIVE Final   Influenza B by PCR NEGATIVE NEGATIVE Final    Comment: (NOTE) The Xpert Xpress SARS-CoV-2/FLU/RSV plus assay is intended as an aid in the diagnosis of influenza from Nasopharyngeal swab specimens and should not be used as a sole basis for treatment. Nasal washings and aspirates are unacceptable for Xpert Xpress SARS-CoV-2/FLU/RSV testing.  Fact Sheet for Patients: BloggerCourse.com  Fact Sheet for Healthcare Providers: SeriousBroker.it  This test is not yet approved or cleared by the [place] FDA and has been authorized for detection and/or diagnosis of SARS-CoV-2 by FDA under an Emergency Use Authorization (EUA). This EUA will remain in effect (meaning this test can be used) for the duration of the COVID-19 declaration under Section 564(b)(1) of the Act, 21 U.S.C. section 360bbb-3(b)(1), unless the authorization is terminated or revoked.  Performed at [facility], [address] [place], [place] [address]   MRSA PCR Screening     Status: None   Collection Time: 09/30/20  1:20 AM   Specimen: Nasal Mucosa; Nasopharyngeal  Result Value Ref Range Status   MRSA by PCR NEGATIVE NEGATIVE Final    Comment:        The GeneXpert MRSA Assay (FDA approved for NASAL specimens only), is one component of a comprehensive MRSA colonization surveillance program. It is not intended to diagnose MRSA infection nor to guide or monitor treatment for MRSA infections. Performed at [facility], [address]  [place], [place] [address]   Blood culture (routine x 2)     Status: None   Collection Time: 09/30/20  3:17 AM   Specimen: BLOOD  Result Value Ref Range Status   Specimen Description BLOOD SITE NOT SPECIFIED  Final   Special Requests   Final    BOTTLES DRAWN AEROBIC ONLY Blood Culture results may not  be optimal due to an inadequate volume of blood received in culture bottles   Culture   Final    NO GROWTH 5 DAYS Performed at [facility], [address] [place], [place] [address]    Report Status 10/05/2020 FINAL  Final     Radiology Studies: No results found.  Marlin CanaryJessica Markis Langland DO [facility]  Between 7 am - 7 pm I am available, please contact me via Amion (for emergencies) or Securechat (non urgent messages)  Between 7 pm - 7 am I am not available, please contact night coverage MD/APP via Amion

## 2020-10-08 NOTE — Plan of Care (Signed)

## 2020-10-09 DIAGNOSIS — J189 Pneumonia, unspecified organism: Secondary | ICD-10-CM

## 2020-10-09 LAB — HEMOGLOBIN AND HEMATOCRIT, BLOOD
HCT: 23.1 % — ABNORMAL LOW (ref 36.0–46.0)
Hemoglobin: 7.2 g/dL — ABNORMAL LOW (ref 12.0–15.0)

## 2020-10-09 LAB — BASIC METABOLIC PANEL
Anion gap: 9 (ref 5–15)
BUN: 18 mg/dL (ref 6–20)
CO2: 22 mmol/L (ref 22–32)
Calcium: 8.7 mg/dL — ABNORMAL LOW (ref 8.9–10.3)
Chloride: 102 mmol/L (ref 98–111)
Creatinine, Ser: 0.78 mg/dL (ref 0.44–1.00)
GFR, Estimated: >60 mL/min (ref >60)
Glucose, Bld: 151 mg/dL — ABNORMAL HIGH (ref 70–99)
Potassium: 3 mmol/L — ABNORMAL LOW (ref 3.5–5.1)
Sodium: 133 mmol/L — ABNORMAL LOW (ref 135–145)

## 2020-10-09 LAB — IRON AND TIBC
Iron: 27 ug/dL — ABNORMAL LOW (ref 28–170)
Saturation Ratios: 10 % — ABNORMAL LOW (ref 10.4–31.8)
TIBC: 281 ug/dL (ref 250–450)
UIBC: 254 ug/dL

## 2020-10-09 LAB — CBC
HCT: 23.8 % — ABNORMAL LOW (ref 36.0–46.0)
Hemoglobin: 7.4 g/dL — ABNORMAL LOW (ref 12.0–15.0)
MCH: 25.7 pg — ABNORMAL LOW (ref 26.0–34.0)
MCHC: 31.1 g/dL (ref 30.0–36.0)
MCV: 82.6 fL (ref 80.0–100.0)
Platelets: 156 10*3/uL (ref 150–400)
RBC: 2.88 MIL/uL — ABNORMAL LOW (ref 3.87–5.11)
RDW: 16.3 % — ABNORMAL HIGH (ref 11.5–15.5)
WBC: 10.1 10*3/uL (ref 4.0–10.5)
nRBC: 0 % (ref 0.0–0.2)

## 2020-10-09 LAB — VANCOMYCIN, TROUGH: Vancomycin Tr: 14 ug/mL — ABNORMAL LOW (ref 15–20)

## 2020-10-09 LAB — FOLATE: Folate: 8.3 ng/mL (ref >5.9)

## 2020-10-09 LAB — FERRITIN: Ferritin: 60 ng/mL (ref 11–307)

## 2020-10-09 LAB — VITAMIN B12: Vitamin B-12: 358 pg/mL (ref 180–914)

## 2020-10-09 LAB — VANCOMYCIN, PEAK: Vancomycin Pk: 37 ug/mL (ref 30–40)

## 2020-10-09 LAB — MAGNESIUM: Magnesium: 2 mg/dL (ref 1.7–2.4)

## 2020-10-09 MED ORDER — POTASSIUM CHLORIDE CRYS ER 20 MEQ PO TBCR
40.0000 meq | EXTENDED_RELEASE_TABLET | ORAL | Status: AC
  Administered 2020-10-09 (×2): 40 meq via ORAL
  Filled 2020-10-09 (×3): qty 2

## 2020-10-09 MED ORDER — SODIUM CHLORIDE 0.9 % IV BOLUS: 500.0000 mL | Freq: Once | INTRAVENOUS | Status: DC

## 2020-10-09 MED ORDER — VANCOMYCIN HCL 1000 MG/200ML IV SOLN
1000.0000 mg | INTRAVENOUS | Status: DC
  Administered 2020-10-10 – 2020-10-12 (×3): 1000 mg via INTRAVENOUS
  Filled 2020-10-09 (×3): qty 200

## 2020-10-09 MED ORDER — PANTOPRAZOLE SODIUM 40 MG PO TBEC
40.0000 mg | DELAYED_RELEASE_TABLET | Freq: Every day | ORAL | Status: DC
  Administered 2020-10-09 – 2020-10-12 (×4): 40 mg via ORAL
  Filled 2020-10-09 (×4): qty 1

## 2020-10-09 NOTE — Plan of Care (Signed)
  Problem: Education: Goal: Knowledge of General Education information will improve Description: Including pain rating scale, medication(s)/side effects and non-pharmacologic comfort measures Outcome: Progressing   Problem: Health Behavior/Discharge Planning: Goal: Ability to manage health-related needs will improve Outcome: Progressing   Problem: Clinical Measurements: Goal: Ability to maintain clinical measurements within normal limits will improve Outcome: Progressing   Problem: Clinical Measurements: Goal: Will remain free from infection Outcome: Progressing   Problem: Clinical Measurements: Goal: Diagnostic test results will improve Outcome: Progressing   Problem: Clinical Measurements: Goal: Cardiovascular complication will be avoided Outcome: Progressing   Problem: Activity: Goal: Risk for activity intolerance will decrease Outcome: Progressing   Problem: Nutrition: Goal: Adequate nutrition will be maintained Outcome: Progressing   Problem: Pain Managment: Goal: General experience of comfort will improve Outcome: Progressing   Problem: Skin Integrity: Goal: Risk for impaired skin integrity will decrease Outcome: Progressing   

## 2020-10-09 NOTE — Progress Notes (Signed)
MEWS Guidelines - (patients age 47 and over) ° ° °

## 2020-10-09 NOTE — Progress Notes (Signed)
Pharmacy Antibiotic Note  Andrea King is a 45 y.o. female admitted on 09/29/2020 with tricuspid valve endocarditis. 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 Noted history of MRSA/serratia in the past so will modify antibiotics to vanc/cefepime/gent. Pharmacy has been consulted for vancomycin, cefepime and gentamicin dosing.   Scr today is stable at 0.78 and CrCl ~ 77 ml/min. Vancomycin peak today was 37 and  Vanc trough 14 with predicted AUC of 640 which is supratherapeutic. Expected that patient is starting to accumulate.    Plan: - Reduce Vancomycin to 1000 mg every 24 hours - Predicted AUC 512 - Repeat Vanc levels next week if renal function stable  - Gentamicin 50 mg every 12 hours - End 6/10 - Cefepime 2 gm IV q 8 hours - Hold rifampin for now given oral intolerance and limited supply of IV formulation  - Monitor renal function closely    Height: 5\' 2"  (157.5 cm) Weight: 64.1 kg (141 lb 5 oz) IBW/kg (Calculated) : 50.1  Temp (24hrs), Avg:98.7 F (37.1 C), Min:98.3 F (36.8 C), Max:99.3 F (37.4 C)  Recent Labs  Lab 10/02/20 2100 10/02/20 2115 10/03/20 0610 10/04/20 0455 10/04/20 1000 10/04/20 1420 10/05/20 0049 10/09/20 0303 10/09/20 0649 10/09/20 1451  WBC  --   --  20.1* 17.2*  --   --  15.2* 10.1  --   --   CREATININE  --  0.66 0.70 0.75  --   --  0.72 0.78  --   --   VANCOTROUGH  --   --  15  --   --   --   --   --  14*  --   VANCOPEAK 37  --   --   --   --   --   --   --   --  37  GENTTROUGH  --   --   --   --  0.9  --   --   --   --   --   GENTPEAK  --   --   --   --   --  5.8  --   --   --   --     Estimated Creatinine Clearance: 77.3 mL/min (by C-G formula based on SCr of 0.78 mg/dL).    No Known Allergies  Antimicrobials this admission:  5/27 gent>> 6/10 5/27 vanc>> 7/8 5/29 cefepime>> 7/8 5/30 Rifampin>>Hold 6/6   Dose adjustments this admission:  5/29 Gent Trough @0533 = 0.8 mcg/ml 5/29 Gent Peak@0822 =  2.2 mcg/ml  5/27 VT= 13; dose increased to 750mg  IV q12h 6/1-6/2; VP = 37; VT = 15 on 750 q 12 hrs - chg to 1250 q 24 hrs. 6/3: Gent trough = 0.9 , gent peak = 5.8 >>Reduce to 50 q 12  6/8: VP= 37, VT=14 - change to 1 gm q 24 hours   Microbiology results:  5/30 blood x2- ngtd 5/29 blood x3- ngtd 5/27 csf- neg   6/30, PharmD, BCPS, BCIDP Infectious Diseases Clinical Pharmacist Phone: [phone]  10/09/2020 4:16 PM   [facility] pharmacy phone numbers are listed on [email or web address]

## 2020-10-09 NOTE — Plan of Care (Signed)

## 2020-10-09 NOTE — Progress Notes (Signed)
PROGRESS NOTE    Andrea King  ZOX:[id number] DOB: 09-07-1975 DOA: 09/29/2020 PCP: Alberteen SpindleSciora, [name], CNM   Chief Complaint  Patient presents with  . 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  On 5/24 she had respiratory failure and had to be intubated, eventually extubated 5/29 and then when she woke up she decided to leave AMA and apparently came straight to [facility], ER.  ID was consulted.    Assessment & Plan:   Principal Problem:   Endocarditis of prosthetic tricuspid valve (HCC) Active Problems:   Opiate abuse, continuous (HCC)   Protein-calorie malnutrition, severe   Hypokalemia   Anemia in other chronic diseases classified elsewhere   Chronic hepatitis C without hepatic coma (HCC)   Cirrhosis of liver without ascites (HCC)   Endocarditis  #1 sepsis secondary to tricuspid valve endocarditis, POA -Patient initially admitted at [facility] however left AMA on 09/29/2020 and presented to [facility]. -Patient noted to have undergone a TEE 09/26/2020 with a EF of 60 to 65%, normal RV, evidence of tricuspid valve endocarditis with a 1 cm vegetation and mild to moderate TVR. -Patient seen in consultation by ID.  Repeat cultures done negative however patient with history of MRSA/Serratia bacteremia. -Due to altered mental status patient underwent LP 5/27 which was unremarkable and no concerns for CNS infection. -Patient being followed by ID and patient with history of MSSA TV endocarditis in the previous year, no culture growth. -ID recommending broad-spectrum antibiotics for 6 weeks with Vanco and cefepime and synergistic gentamicin. -Patient  refusing rifampin. -ID recommended that after treatment completion, with prosthetic valve patient will need suppressive antibiotics for treatment with a negative culture and recommending doxycycline and Augmentin long-term after completion of initial IV antibiotics.  2.  Neck pain -Patient noted to have felt something moving at the site of her previous surgery but asymptomatic clinically.  Supportive care.  3.  Hypokalemia/hypomagnesemia K. Dur 40 mEq p.o. every 4 hours x2 doses. Magnesium at 2.0. -Repeat labs in the morning.  4.  Anemia Patient with no overt bleeding. -Check an anemia panel. -Repeat H&H this afternoon. -Transfusion threshold hemoglobin < 7.  5.  Multifocal pneumonia  - Likely secondary to septic emboli. -Chest x-ray on admission with some improvement. -Continue empiric antibiotics as #1.  6.  Polysubstance abuse/IVDU -Patient noted on admission to be concerned about withdrawals. -Patient currently on buprenorphine. -Telemetry sitter as needed.  7.  Liver cirrhosis/history of hep C -Outpatient follow-up.  8.  Cervical neck pain -CT scan from 09/2020: Postop changes from prior anterior fusion at C3-C7, with posterior fusion at C2-T2, -CT scan from 5/22: Postoperative changes from prior anterior fusion at C3 through C7, with posterior fusion at C2 through T2. Periprosthetic lucency about the transpedicular screws on the left at C4 and bilaterally at T1 and T2, suggestive of loosening. -[facility] so will need to follow up there - no neurologic deficits.  9.  Borderline blood pressure -Likely secondary to acute infection of endocarditis in the setting of anemia. -Patient denies any lightheadedness. -Normal saline 500 cc bolus x1. -If blood pressure remains persistently soft and hemoglobin continues to trend down will likely need  a transfusion of packed red blood cells.   DVT prophylaxis: SCDs Code Status: Full Family Communication: Updated  patient.  No family at bedside. Disposition:   Status is: Inpatient    Dispo: The patient is from: Home              Anticipated d/c is to: TBD              Patient currently receiving IV antibiotics through July 8, borderline blood pressure, not stable for discharge.   Difficult to place patient yes       Consultants:   ID: Dr. Drue Second 09/30/2020    Procedures:   Chest x-ray 09/29/2020  Antimicrobials:   IV cefepime 09/29/2020>>>> 11/09/2020  IV gentamicin 09/29/2020>>>> 10/11/2020  IV rifampin 09/29/2020>>>> 10/07/2020  Oral rifampin 09/30/2020>>>> 10/04/2020   Subjective: Sitting up in bed.  Denies chest pain.  No shortness of breath.  States she can feel palpitations and feels like her heart is racing from time to time.  Overall starting to feel better.  Little hesitant to thoughts of possible transfusion.  Objective: Vitals:   10/09/20 0837 10/09/20 0900 10/09/20 0915 10/09/20 1131  BP: (!) 90/58 92/68 93/66  108/75  Pulse: (!) 120 (!) 114 (!) 117   Resp: 13 17 14 12   Temp: 98.5 F (36.9 C)   98.3 F (36.8 C)  TempSrc: Oral   Oral  SpO2: 96%     Weight:      Height:        Intake/Output Summary (Last 24 hours) at 10/09/2020 1525 Last data filed at 10/08/2020 2300 Gross per 24 hour  Intake 1415.8 ml  Output --  Net 1415.8 ml   Filed Weights   09/30/20 0015  Weight: 64.1 kg    Examination:  General exam: Appears calm and comfortable  Respiratory system: Clear to auscultation. Respiratory effort normal. Cardiovascular system: Tachycardia. No JVD, murmurs, rubs, gallops or clicks. No pedal edema. Gastrointestinal system: Abdomen is nondistended, soft and nontender. No organomegaly or masses felt. Normal bowel sounds heard. Central nervous system: Alert and oriented. No focal neurological deficits. Extremities: Symmetric 5 x 5 power. Skin: No rashes, lesions or ulcers Psychiatry: Judgement and insight appear normal. Mood & affect appropriate.     Data  Reviewed: I have personally reviewed following labs and imaging studies  CBC: Recent Labs  Lab 10/03/20 0610 10/04/20 0455 10/05/20 0049 10/09/20 0303  WBC 20.1* 17.2* 15.2* 10.1  NEUTROABS 16.7* 14.3* 11.6*  --   HGB 8.3* 9.0* 8.2* 7.4*  HCT 25.8* 28.4* 25.7* 23.8*  MCV 82.7 82.8 82.1 82.6  PLT 294 275 221 156    Basic Metabolic Panel: Recent Labs  Lab 10/02/20 2115 10/03/20 0610 10/04/20 0455 10/05/20 0049 10/09/20 0303 10/09/20 0925  NA 134* 134* 131* 131* 133*  --   K 2.8* 2.8* 3.4* 3.7 3.0*  --   CL 98 103 102 105 102  --   CO2 25 23 22  21* 22  --   GLUCOSE 92 92 152* 164* 151*  --   BUN 10 7 11 12 18   --   CREATININE 0.66 0.70 0.75 0.72 0.78  --   CALCIUM 8.0* 8.1* 8.1* 8.2* 8.7*  --   MG  --   --  2.3  --   --  2.0    GFR: Estimated Creatinine Clearance: 77.3 mL/min (by C-G formula based on SCr of 0.78 mg/dL).  Liver Function Tests: Recent Labs  Lab 10/02/20 2115 10/03/20 0610 10/04/20 0455  10/05/20 0049  AST 19 14* 12* 13*  ALT 17 13 13 11   ALKPHOS 53 56 53 49  BILITOT 1.3* 0.8 0.5 0.1*  PROT 6.4* 6.4* 7.1 6.5  ALBUMIN 2.7* 2.7* 2.9* 2.7*    CBG: No results for input(s): GLUCAP in the last 168 hours.   Recent Results (from the past 240 hour(s))  Blood culture (routine x 2)     Status: None   Collection Time: 09/29/20  8:20 PM   Specimen: Right Antecubital; Blood  Result Value Ref Range Status   Specimen Description RIGHT ANTECUBITAL  Final   Special Requests   Final    BOTTLES DRAWN AEROBIC AND ANAEROBIC Blood Culture adequate volume   Culture   Final    NO GROWTH 5 DAYS Performed at [facility], [address], [place], [place]    Report Status 10/04/2020 FINAL  Final  Resp Panel by RT-PCR (Flu A&B, Covid) Nasopharyngeal Swab     Status: None   Collection Time: 09/29/20 10:17 PM   Specimen: Nasopharyngeal Swab; Nasopharyngeal(NP) swabs in vial transport medium  Result Value Ref Range Status   SARS Coronavirus 2 by  RT PCR NEGATIVE NEGATIVE Final    Comment: (NOTE) SARS-CoV-2 target nucleic acids are NOT DETECTED.  The SARS-CoV-2 RNA is generally detectable in upper respiratory specimens during the acute phase of infection. The lowest concentration of SARS-CoV-2 viral copies this assay can detect is 138 copies/mL. A negative result does not preclude SARS-Cov-2 infection and should not be used as the sole basis for treatment or other patient management decisions. A negative result may occur with  improper specimen collection/handling, submission of specimen other than nasopharyngeal swab, presence of viral mutation(s) within the areas targeted by this assay, and inadequate number of viral copies(<138 copies/mL). A negative result must be combined with clinical observations, patient history, and epidemiological information. The expected result is Negative.  Fact Sheet for Patients:  10/01/20  Fact Sheet for Healthcare Providers:  BloggerCourse.com  This test is no t yet approved or cleared by the [employer]ious[employer]ker.[place] FDA and  has been authorized for detection and/or diagnosis of SARS-CoV-2 by FDA under an Emergency Use Authorization (EUA). This EUA will remain  in effect (meaning this test can be used) for the duration of the COVID-19 declaration under Section 564(b)(1) of the Act, 21 U.S.C.section 360bbb-3(b)(1), unless the authorization is terminated  or revoked sooner.       Influenza A by PCR NEGATIVE NEGATIVE Final   Influenza B by PCR NEGATIVE NEGATIVE Final    Comment: (NOTE) The Xpert Xpress SARS-CoV-2/FLU/RSV plus assay is intended as an aid in the diagnosis of influenza from Nasopharyngeal swab specimens and should not be used as a sole basis for treatment. Nasal washings and aspirates are unacceptable for Xpert Xpress SARS-CoV-2/FLU/RSV testing.  Fact Sheet for Patients: [place]  Fact Sheet  for Healthcare Providers: BloggerCourse.com  This test is not yet approved or cleared by the SeriousBroker.it FDA and has been authorized for detection and/or diagnosis of SARS-CoV-2 by FDA under an Emergency Use Authorization (EUA). This EUA will remain in effect (meaning this test can be used) for the duration of the COVID-19 declaration under Section 564(b)(1) of the Act, 21 U.S.C. section 360bbb-3(b)(1), unless the authorization is terminated or revoked.  Performed at [facility], [address] [place], [place]   MRSA PCR Screening     Status: None   Collection Time: 09/30/20  1:20 AM  Specimen: Nasal Mucosa; Nasopharyngeal  Result Value Ref Range Status   MRSA by PCR NEGATIVE NEGATIVE Final    Comment:        The GeneXpert MRSA Assay (FDA approved for NASAL specimens only), is one component of a comprehensive MRSA colonization surveillance program. It is not intended to diagnose MRSA infection nor to guide or monitor treatment for MRSA infections. Performed at [facility], [address] [place], [place] [address]   Blood culture (routine x 2)     Status: None   Collection Time: 09/30/20  3:17 AM   Specimen: BLOOD  Result Value Ref Range Status   Specimen Description BLOOD SITE NOT SPECIFIED  Final   Special Requests   Final    BOTTLES DRAWN AEROBIC ONLY Blood Culture results may not be optimal due to an inadequate volume of blood received in culture bottles   Culture   Final    NO GROWTH 5 DAYS Performed at [facility], [address], [place], [place] [address]    Report Status 10/05/2020 FINAL  Final         Radiology Studies: No results found.      Scheduled Meds: . acidophilus  2 capsule Oral Daily  . buprenorphine  8 mg Sublingual Daily  . Chlorhexidine Gluconate Cloth  6 each Topical Daily  . metoprolol tartrate  12.5 mg Oral BID  . pantoprazole  40 mg Oral Q0600  . sodium chloride flush   10-40 mL Intracatheter Q12H  . traZODone  50 mg Oral QHS   Continuous Infusions: . ceFEPime (MAXIPIME) IV 2 g (10/09/20 1451)  . gentamicin 50 mg (10/09/20 1248)  . promethazine (PHENERGAN) injection (IM or IVPB) Stopped (10/04/20 2344)  . sodium chloride    . vancomycin 1,250 mg (10/09/20 0923)     LOS: 9 days    Time spent: 40 minutes    Ramiro Harvest, MD [facility]   To contact the attending provider between 7A-7P or the covering provider during after hours 7P-7A, please log into the web site [email or web address] and access using universal [place] password for that web site. If you do not have the password, please call the hospital operator.  10/09/2020, 3:25 PM

## 2020-10-10 LAB — CBC
HCT: 23.5 % — ABNORMAL LOW (ref 36.0–46.0)
Hemoglobin: 7.3 g/dL — ABNORMAL LOW (ref 12.0–15.0)
MCH: 25.8 pg — ABNORMAL LOW (ref 26.0–34.0)
MCHC: 31.1 g/dL (ref 30.0–36.0)
MCV: 83 fL (ref 80.0–100.0)
Platelets: 133 10*3/uL — ABNORMAL LOW (ref 150–400)
RBC: 2.83 MIL/uL — ABNORMAL LOW (ref 3.87–5.11)
RDW: 16.7 % — ABNORMAL HIGH (ref 11.5–15.5)
WBC: 9.7 10*3/uL (ref 4.0–10.5)
nRBC: 0 % (ref 0.0–0.2)

## 2020-10-10 MED ORDER — DIPHENHYDRAMINE HCL 25 MG PO CAPS
25.0000 mg | ORAL_CAPSULE | Freq: Once | ORAL | Status: AC
  Administered 2020-10-11: 25 mg via ORAL
  Filled 2020-10-10: qty 1

## 2020-10-10 MED ORDER — SODIUM CHLORIDE 0.9% IV SOLUTION: Freq: Once | INTRAVENOUS | Status: AC

## 2020-10-10 MED ORDER — ACETAMINOPHEN 325 MG PO TABS
650.0000 mg | ORAL_TABLET | Freq: Once | ORAL | Status: AC
  Administered 2020-10-11: 650 mg via ORAL
  Filled 2020-10-10: qty 2

## 2020-10-10 NOTE — TOC Progression Note (Signed)
Transition of Care [facility]) - Progression Note    Patient Details  Name: Andrea King MRN: [id number] Date of Birth: 1975/08/06  Transition of Care [facility]) CM/SW Contact  Leone Haven, RN Phone Number: 10/10/2020, 4:07 PM  Clinical Narrative:    NCM spoke with patient at bedside and gave her the housing resources.  She was asking if we could set her up with section eight and get her on the list.  NCM informed her that she will have to speak with housing authority regarding that.  She states the resource information will be helpful.         Expected Discharge Plan and Services                                                 Social Determinants of Health (SDOH) Interventions    Readmission Risk Interventions No flowsheet data found.

## 2020-10-10 NOTE — Plan of Care (Signed)
  Problem: Education: Goal: Knowledge of General Education information will improve Description: Including pain rating scale, medication(s)/side effects and non-pharmacologic comfort measures Outcome: Progressing   Problem: Health Behavior/Discharge Planning: Goal: Ability to manage health-related needs will improve Outcome: Progressing   Problem: Clinical Measurements: Goal: Ability to maintain clinical measurements within normal limits will improve Outcome: Progressing   Problem: Clinical Measurements: Goal: Diagnostic test results will improve Outcome: Progressing   Problem: Activity: Goal: Risk for activity intolerance will decrease Outcome: Progressing   Problem: Clinical Measurements: Goal: Cardiovascular complication will be avoided Outcome: Progressing   Problem: Nutrition: Goal: Adequate nutrition will be maintained Outcome: Progressing   Problem: Pain Managment: Goal: General experience of comfort will improve Outcome: Progressing   Problem: Skin Integrity: Goal: Risk for impaired skin integrity will decrease Outcome: Progressing   

## 2020-10-10 NOTE — Progress Notes (Signed)
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  On 5/24 she had respiratory failure and had to be intubated, eventually extubated 5/29 and then when she woke up she decided to leave AMA and apparently came straight to [facility], ER.  ID was consulted.     Assessment & Plan:   Principal Problem:   Endocarditis of prosthetic tricuspid valve (HCC) Active Problems:   Opiate abuse, continuous (HCC)   Protein-calorie malnutrition, severe   Hypokalemia   Anemia in other chronic diseases classified elsewhere   Chronic hepatitis C without hepatic coma (HCC)   Cirrhosis of liver without ascites (HCC)   Endocarditis  1 sepsis secondary to tricuspid valve endocarditis, POA -Patient initially admitted at [facility] however left AMA on 09/29/2020 and presented to [facility] -Patient noted to have undergone a TEE 09/26/2020 with a EF of 60 to 65%, normal RV, evidence of tricuspid valve endocarditis with a 1 cm vegetation and mild to moderate TVR. -Patient seen in consultation by ID.  Repeat cultures done negative however patient with history of MRSA/Serratia bacteremia. -Due to altered mental status patient underwent LP 5/27 which was unremarkable and no concerns for CNS infection. -Patient being followed by ID and patient with history of MSSA TV endocarditis in the previous year, no culture growth. -ID recommending broad-spectrum antibiotics for 6 weeks with Vanco and cefepime and synergistic gentamicin. -Patient  refusing oral rifampin secondary to intolerance.   -ID recommended that after treatment completion, with prosthetic valve patient will need suppressive antibiotics for treatment with a negative culture and recommending doxycycline and Augmentin long-term after completion of initial IV antibiotics. -ID following and appreciate input and recommendations.  2.  Neck pain -Patient noted to have felt something moving at the site of her previous surgery but asymptomatic clinically.  Outpatient follow-up supportive care.  3.  Hypokalemia/hypomagnesemia Bmet ordered however patient refusing lab draw at this time.  -Repeat labs in the morning.    4.  Anemia Patient with no overt bleeding. -Anemia panel with iron level of 27, TIBC of 281, folate of 8.3, ferritin of 60, vitamin B12 of 358.   -Hemoglobin currently at 7.3 this morning.   -Transfusion of PRBC ordered however patient stating she was raised as a Jehovah's Witness, currently feels okay and refusing transfusion of packed red blood cells at this time unless she is on the verge of death per patient.   -Follow H&H.    5.  Multifocal pneumonia  - Likely secondary to septic emboli. -Chest x-ray on admission with improvement.   -Patient currently afebrile.  Not hypoxic.   -Continue empiric antibiotics as #1.   6.  Polysubstance abuse/IVDU -Patient noted on admission to be concerned about withdrawals. -Patient currently on buprenorphine. -We will need outpatient follow-up. Technical sales engineer as needed.  7.  Liver cirrhosis/history of hep C -Outpatient follow-up.  8.  Cervical neck pain -CT scan from 09/2020: Postop changes from prior anterior fusion at C3-C7, with posterior fusion at C2-T2, -CT scan from 5/22: Postoperative changes from  prior anterior fusion at C3 through C7, with posterior fusion at C2 through T2. Periprosthetic lucency about the transpedicular screws on the left at C4 and bilaterally at T1 and T2, suggestive of  loosening. -surgery was at [facility] so will need to follow up there - no neurologic deficits.  9.  Borderline blood pressure -Likely secondary to acute infection of endocarditis in the setting of anemia. -Patient denies any chest pain, no dizziness, no lightheadedness.  -Patient stating blood pressure usually is low.   -PRBC transfusion recommended and ordered however patient refusing at this time stating she was raised as a Jehovah's Witness and would not get any blood products unless she is on the verge of death.   DVT prophylaxis: SCDs Code Status: Full Family Communication: Updated patient.  Updated brother at bedside.   Disposition:   Status is: Inpatient    Dispo: The patient is from: Home              Anticipated d/c is to: TBD              Patient currently receiving IV antibiotics through July 8-9, borderline blood pressure, anemia, not stable for discharge.   Difficult to place patient yes       Consultants:  ID: Dr. Drue Second 09/30/2020   Procedures:  Chest x-ray 09/29/2020  Antimicrobials:  IV cefepime 09/29/2020>>>> 11/09/2020 IV gentamicin 09/29/2020>>>> 10/11/2020 IV rifampin 09/29/2020>>>> 10/07/2020 Oral rifampin 09/30/2020>>>> 10/04/2020   Subjective: Sitting up in bed on the telephone stating trying to find housing for when she is discharged out of the hospital eventually.  Denies chest pain.  No shortness of breath.  No lightheadedness.  No dizziness.  Overall stating starting to feel better.   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  Objective: Vitals:   10/09/20 2211 10/09/20 2300 10/10/20 0321 10/10/20 0737  BP: 99/67 93/65 118/72 102/68  Pulse: (!) 129 (!) 116 (!) 110   Resp:  16 17 16   Temp:  99.3 F (37.4 C) 98.4 F (36.9 C) 98.9 F (37.2 C)  TempSrc:  Oral Oral Oral  SpO2:  96% 97%   Weight:      Height:        Intake/Output Summary (Last 24  hours) at 10/10/2020 1003 Last data filed at 10/10/2020 0321 Gross per 24 hour  Intake 1252.6 ml  Output --  Net 1252.6 ml    Filed Weights   09/30/20 0015  Weight: 64.1 kg    Examination:  General exam: NAD Respiratory system: CTA B.  No wheezes, no crackles, no rhonchi.  Normal respiratory effort.   Cardiovascular system: Tachycardia.  No JVD.  No lower extremity edema.  Gastrointestinal system: Abdomen is soft, nontender, nondistended, positive bowel sounds.  No rebound.  No guarding.  Central nervous system: Alert and oriented.  Moving extremities spontaneously.  No focal neurological deficits. Extremities: Symmetric 5 x 5 power. Skin: No rashes, lesions or ulcers Psychiatry: Judgement and insight appear normal. Mood & affect appropriate.     Data Reviewed: I have personally reviewed following labs and imaging studies  CBC: Recent Labs  Lab 10/04/20 0455 10/05/20 0049 10/09/20 0303 10/09/20 1540 10/10/20 0329  WBC 17.2* 15.2* 10.1  --  9.7  NEUTROABS 14.3* 11.6*  --   --   --   HGB 9.0* 8.2* 7.4* 7.2* 7.3*  HCT 28.4* 25.7* 23.8* 23.1* 23.5*  MCV  82.8 82.1 82.6  --  83.0  PLT 275 221 156  --  133*     Basic Metabolic Panel: Recent Labs  Lab 10/04/20 0455 10/05/20 0049 10/09/20 0303 10/09/20 0925  NA 131* 131* 133*  --   K 3.4* 3.7 3.0*  --   CL 102 105 102  --   CO2 22 21* 22  --   GLUCOSE 152* 164* 151*  --   BUN 11 12 18   --   CREATININE 0.75 0.72 0.78  --   CALCIUM 8.1* 8.2* 8.7*  --   MG 2.3  --   --  2.0     GFR: Estimated Creatinine Clearance: 77.3 mL/min (by C-G formula based on SCr of 0.78 mg/dL).  Liver Function Tests: Recent Labs  Lab 10/04/20 0455 10/05/20 0049  AST 12* 13*  ALT 13 11  ALKPHOS 53 49  BILITOT 0.5 0.1*  PROT 7.1 6.5  ALBUMIN 2.9* 2.7*     CBG: No results for input(s): GLUCAP in the last 168 hours.   No results found for this or any previous visit (from the past 240 hour(s)).        Radiology  Studies: No results found.      Scheduled Meds:  sodium chloride   Intravenous Once   acetaminophen  650 mg Oral Once   acidophilus  2 capsule Oral Daily   buprenorphine  8 mg Sublingual Daily   Chlorhexidine Gluconate Cloth  6 each Topical Daily   diphenhydrAMINE  25 mg Oral Once   metoprolol tartrate  12.5 mg Oral BID   pantoprazole  40 mg Oral Q0600   sodium chloride flush  10-40 mL Intracatheter Q12H   traZODone  50 mg Oral QHS   Continuous Infusions:  ceFEPime (MAXIPIME) IV 2 g (10/10/20 0545)   gentamicin 50 mg (10/09/20 2220)   promethazine (PHENERGAN) injection (IM or IVPB) Stopped (10/04/20 2344)   sodium chloride     vancomycin 1,000 mg (10/10/20 0802)     LOS: 10 days    Time spent: 35 minutes    12/10/20, MD [facility]   To contact the attending provider between 7A-7P or the covering provider during after hours 7P-7A, please log into the web site [email or web address] and access using universal [place] password for that web site. If you do not have the password, please call the hospital operator.  10/10/2020, 10:03 AM

## 2020-10-10 NOTE — Progress Notes (Signed)
Pt refused blood products, saying she is Jehovah's Witness and was "raised that way." Pt states "I'm not going to die without it." Pt also refused BMP, saying, "they have taken a pint of blood from me," and feels that is why her "blood is low." RN provided education and pt still refused. RN spoke with MD and made MD aware or refusals. RN will continue to monitor.

## 2020-10-11 DIAGNOSIS — B3731 Acute candidiasis of vulva and vagina: Secondary | ICD-10-CM

## 2020-10-11 DIAGNOSIS — B373 Candidiasis of vulva and vagina: Secondary | ICD-10-CM

## 2020-10-11 LAB — BASIC METABOLIC PANEL
Anion gap: 8 (ref 5–15)
BUN: 15 mg/dL (ref 6–20)
CO2: 24 mmol/L (ref 22–32)
Calcium: 8.6 mg/dL — ABNORMAL LOW (ref 8.9–10.3)
Chloride: 101 mmol/L (ref 98–111)
Creatinine, Ser: 0.84 mg/dL (ref 0.44–1.00)
GFR, Estimated: >60 mL/min (ref >60)
Glucose, Bld: 110 mg/dL — ABNORMAL HIGH (ref 70–99)
Potassium: 2.9 mmol/L — ABNORMAL LOW (ref 3.5–5.1)
Sodium: 133 mmol/L — ABNORMAL LOW (ref 135–145)

## 2020-10-11 LAB — CBC
HCT: 23 % — ABNORMAL LOW (ref 36.0–46.0)
Hemoglobin: 7.2 g/dL — ABNORMAL LOW (ref 12.0–15.0)
MCH: 26 pg (ref 26.0–34.0)
MCHC: 31.3 g/dL (ref 30.0–36.0)
MCV: 83 fL (ref 80.0–100.0)
Platelets: 127 10*3/uL — ABNORMAL LOW (ref 150–400)
RBC: 2.77 MIL/uL — ABNORMAL LOW (ref 3.87–5.11)
RDW: 16.7 % — ABNORMAL HIGH (ref 11.5–15.5)
WBC: 11 10*3/uL — ABNORMAL HIGH (ref 4.0–10.5)
nRBC: 0 % (ref 0.0–0.2)

## 2020-10-11 LAB — RAPID URINE DRUG SCREEN, HOSP PERFORMED
Amphetamines: NOT DETECTED
Barbiturates: NOT DETECTED
Benzodiazepines: NOT DETECTED
Cocaine: NOT DETECTED
Opiates: NOT DETECTED
Tetrahydrocannabinol: NOT DETECTED

## 2020-10-11 LAB — MAGNESIUM: Magnesium: 2.2 mg/dL (ref 1.7–2.4)

## 2020-10-11 MED ORDER — SODIUM CHLORIDE 0.9 % IV BOLUS
1000.0000 mL | Freq: Once | INTRAVENOUS | Status: AC
  Administered 2020-10-11: 1000 mL via INTRAVENOUS

## 2020-10-11 MED ORDER — POTASSIUM CHLORIDE CRYS ER 20 MEQ PO TBCR
40.0000 meq | EXTENDED_RELEASE_TABLET | ORAL | Status: AC
Start: 2020-10-11 — End: 2020-10-11
  Administered 2020-10-11 (×2): 40 meq via ORAL
  Filled 2020-10-11 (×2): qty 2

## 2020-10-11 MED ORDER — FLUCONAZOLE 150 MG PO TABS
150.0000 mg | ORAL_TABLET | Freq: Once | ORAL | Status: AC
  Administered 2020-10-11: 150 mg via ORAL
  Filled 2020-10-11: qty 1

## 2020-10-11 MED ORDER — NYSTATIN 100000 UNIT/ML MT SUSP
5.0000 mL | Freq: Four times a day (QID) | OROMUCOSAL | Status: DC
  Administered 2020-10-11 – 2020-10-12 (×5): 500000 [IU] via ORAL
  Filled 2020-10-11 (×5): qty 5

## 2020-10-11 MED ORDER — POLYSACCHARIDE IRON COMPLEX 150 MG PO CAPS
150.0000 mg | ORAL_CAPSULE | Freq: Every day | ORAL | Status: DC
  Administered 2020-10-11 – 2020-10-12 (×2): 150 mg via ORAL
  Filled 2020-10-11 (×2): qty 1

## 2020-10-11 MED ORDER — ENSURE ENLIVE PO LIQD: 237.0000 mL | Freq: Two times a day (BID) | ORAL | Status: DC

## 2020-10-11 MED ORDER — CLOTRIMAZOLE 1 % VA CREA
1.0000 | TOPICAL_CREAM | Freq: Every day | VAGINAL | Status: DC
  Administered 2020-10-11: 1 via VAGINAL
  Filled 2020-10-11: qty 45

## 2020-10-11 NOTE — Plan of Care (Signed)
  Problem: Education: Goal: Knowledge of General Education information will improve Description Including pain rating scale, medication(s)/side effects and non-pharmacologic comfort measures Outcome: Progressing   

## 2020-10-11 NOTE — Progress Notes (Signed)
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  On 5/24 she had respiratory failure and had to be intubated, eventually extubated 5/29 and then when she woke up she decided to leave AMA and apparently came straight to [facility], ER.  ID was consulted.     Assessment & Plan:   Principal Problem:   Endocarditis of prosthetic tricuspid valve (HCC) Active Problems:   Opiate abuse, continuous (HCC)   Protein-calorie malnutrition, severe   Hypokalemia   Anemia in other chronic diseases classified elsewhere   Chronic hepatitis C without hepatic coma (HCC)   Cirrhosis of liver without ascites (HCC)   Endocarditis  1 sepsis secondary to tricuspid valve endocarditis, POA -Patient initially admitted at [facility] however left AMA on 09/29/2020 and presented to [facility]. -Patient noted to have undergone a TEE 09/26/2020 with a EF of 60 to 65%, normal RV, evidence of tricuspid valve endocarditis with a 1 cm vegetation and mild to moderate TVR. -Patient seen in consultation by ID.  Repeat cultures done negative however patient with history of MRSA/Serratia bacteremia. -Due to altered mental status patient underwent LP 5/27 which was unremarkable and no concerns for CNS infection. -Patient being followed by ID and patient with history of MSSA TV endocarditis in the previous year, no culture growth. -ID recommending broad-spectrum antibiotics for 6 weeks with Vanco and cefepime and synergistic gentamicin. -Patient  refusing oral rifampin secondary to intolerance.   -ID recommended that after treatment completion, with prosthetic valve patient will need suppressive antibiotics for treatment with a negative culture and recommending doxycycline and Augmentin long-term after completion of initial IV antibiotics. -ID following and appreciate input and recommendations.  2.  Neck pain -Patient noted to have felt something moving at the site of her previous surgery but asymptomatic clinically.  Outpatient follow-up supportive care.  3.  Hypokalemia/hypomagnesemia -Potassium at 2.9.  Magnesium at 2.2.   -K-Dur 40 mEq p.o. every 4 hours x2 doses. -Repeat labs in the morning.  4.  Anemia Patient with no overt bleeding. -Anemia panel with iron level of 27, TIBC of 281, folate of 8.3, ferritin of 60, vitamin B12 of 358.   -Hemoglobin stable at 7.2.  -Transfusion of PRBC ordered on 10/10/2020, however patient stating she was raised as a Jehovah's Witness, currently feels okay and refusing transfusion of packed red blood cells at this time unless she is on the verge of death per patient.  -Place on oral iron supplementation. -Follow H&H.    5.  Multifocal pneumonia  - Likely secondary to septic emboli. -Chest x-ray on admission with improvement.   -Patient currently afebrile.  Not hypoxic.   -Continue empiric antibiotics as #1.   6.  Polysubstance abuse/IVDU -Patient noted on admission to be concerned about withdrawals. -Patient currently on buprenorphine. -Was notified by RN around 1:30 PM due to concerns that patient may have received some substance from brother at bedside.   -Check a UDS.   -Geographical information systems officer.  7.  Liver cirrhosis/history of hep C -Stable.  Outpatient follow-up.   8.  Cervical neck pain -CT scan from 09/2020: Postop changes from prior anterior fusion at C3-C7, with posterior fusion at C2-T2, -CT scan from 5/22: Postoperative changes from prior anterior fusion at C3 through C7, with  posterior fusion at C2 through T2. Periprosthetic lucency about the transpedicular screws on the left at C4 and bilaterally at T1 and T2, suggestive of loosening. -surgery was at [facility] so will need to follow up there - no neurologic deficits.  9.  Borderline blood pressure -Likely secondary to acute infection of endocarditis in the setting of anemia. -Patient denies any chest pain, no dizziness, no lightheadedness.  -Patient stating blood pressure usually is low.   -PRBC transfusion recommended and ordered however patient refusing at this time stating she was raised as a Jehovah's Witness and would not get any blood products unless she is on the verge of death.  10.  Vaginal candidiasis/?  Early oral thrush -Diflucan 150 mg p.o. x1. -Place on clotrimazole cream nightly x7 days. -Nystatin swish and swallow x7 days.   DVT prophylaxis: SCDs Code Status: Full Family Communication: Updated patient.  No family at bedside. Disposition:   Status is: Inpatient    Dispo: The patient is from: Home              Anticipated d/c is to: TBD              Patient currently receiving IV antibiotics through July 8-9, borderline blood pressure, anemia, not stable for discharge.   Difficult to place patient yes       Consultants:  ID: Dr. Drue Second 09/30/2020   Procedures:  Chest x-ray 09/29/2020  Antimicrobials:  IV cefepime 09/29/2020>>>> 11/09/2020 IV gentamicin 09/29/2020>>>> 10/11/2020 IV rifampin 09/29/2020>>>> 10/07/2020 Oral rifampin 09/30/2020>>>> 10/04/2020   Subjective: Sitting up in bed.  Complaining of some neck discomfort.  Also complaining of some vaginal itching concern for vaginal candidiasis.  States has a tickle in her throat concerned she might be getting oral candidiasis as well.  No bleeding.  No chest pain.  No shortness of breath.  No lightheadedness.  Overall feeling better.   Was notified by RN around 1:30 PM with concerns that patient may have gotten some kind of substance  from family member at bedside.  Objective: Vitals:   10/10/20 2055 10/10/20 2256 10/11/20 0334 10/11/20 0858  BP: 99/60 102/61 (!) 92/57 112/63  Pulse: (!) 109 (!) 110 99   Resp: 19 16 16    Temp: 98.4 F (36.9 C) 99.3 F (37.4 C) 99.2 F (37.3 C)   TempSrc: Oral Oral Oral   SpO2: 97% 97% 97%   Weight:      Height:        Intake/Output Summary (Last 24 hours) at 10/11/2020 0947 Last data filed at 10/11/2020 0911 Gross per 24 hour  Intake 653.37 ml  Output --  Net 653.37 ml    Filed Weights   09/30/20 0015  Weight: 64.1 kg    Examination:  General exam: : NAD Respiratory system: CTAB, no wheezes, no crackles, no rhonchi.  Normal respiratory effort.  Speaking in full sentences.  Cardiovascular system: Tachycardia.  No murmurs rubs or gallops.  No JVD.  No lower extremity edema.  Gastrointestinal system: Abdomen soft, nontender, nondistended, positive bowel sounds.  No rebound.  No guarding. Central nervous system: Alert and oriented. No focal neurological deficits. Extremities: Symmetric 5 x 5 power. Skin: No rashes, lesions or ulcers Psychiatry: Judgement and insight appear normal. Mood &  affect appropriate.  Data Reviewed: I have personally reviewed following labs and imaging studies  CBC: Recent Labs  Lab 10/05/20 0049 10/09/20 0303 10/09/20 1540 10/10/20 0329 10/11/20 0341  WBC 15.2* 10.1  --  9.7 11.0*  NEUTROABS 11.6*  --   --   --   --   HGB 8.2* 7.4* 7.2* 7.3* 7.2*  HCT 25.7* 23.8* 23.1* 23.5* 23.0*  MCV 82.1 82.6  --  83.0 83.0  PLT 221 156  --  133* 127*     Basic Metabolic Panel: Recent Labs  Lab 10/05/20 0049 10/09/20 0303 10/09/20 0925 10/11/20 0341  NA 131* 133*  --  133*  K 3.7 3.0*  --  2.9*  CL 105 102  --  101  CO2 21* 22  --  24  GLUCOSE 164* 151*  --  110*  BUN 12 18  --  15  CREATININE 0.72 0.78  --  0.84  CALCIUM 8.2* 8.7*  --  8.6*  MG  --   --  2.0 2.2     GFR: Estimated Creatinine Clearance: 73.6 mL/min (by C-G  formula based on SCr of 0.84 mg/dL).  Liver Function Tests: Recent Labs  Lab 10/05/20 0049  AST 13*  ALT 11  ALKPHOS 49  BILITOT 0.1*  PROT 6.5  ALBUMIN 2.7*     CBG: No results for input(s): GLUCAP in the last 168 hours.   No results found for this or any previous visit (from the past 240 hour(s)).        Radiology Studies: No results found.      Scheduled Meds:  acidophilus  2 capsule Oral Daily   buprenorphine  8 mg Sublingual Daily   Chlorhexidine Gluconate Cloth  6 each Topical Daily   metoprolol tartrate  12.5 mg Oral BID   pantoprazole  40 mg Oral Q0600   potassium chloride  40 mEq Oral Q4H   sodium chloride flush  10-40 mL Intracatheter Q12H   traZODone  50 mg Oral QHS   Continuous Infusions:  ceFEPime (MAXIPIME) IV 2 g (10/11/20 0526)   gentamicin 50 mg (10/10/20 2304)   promethazine (PHENERGAN) injection (IM or IVPB) Stopped (10/04/20 2344)   sodium chloride     vancomycin 1,000 mg (10/11/20 0801)     LOS: 11 days    Time spent: 35 minutes    Ramiro Harvest, MD [facility]   To contact the attending provider between 7A-7P or the covering provider during after hours 7P-7A, please log into the web site [email or web address] and access using universal [place] password for that web site. If you do not have the password, please call the hospital operator.  10/11/2020, 9:47 AM

## 2020-10-12 LAB — BASIC METABOLIC PANEL
Anion gap: 6 (ref 5–15)
BUN: 11 mg/dL (ref 6–20)
CO2: 25 mmol/L (ref 22–32)
Calcium: 8.6 mg/dL — ABNORMAL LOW (ref 8.9–10.3)
Chloride: 103 mmol/L (ref 98–111)
Creatinine, Ser: 0.74 mg/dL (ref 0.44–1.00)
GFR, Estimated: >60 mL/min (ref >60)
Glucose, Bld: 119 mg/dL — ABNORMAL HIGH (ref 70–99)
Potassium: 3 mmol/L — ABNORMAL LOW (ref 3.5–5.1)
Sodium: 134 mmol/L — ABNORMAL LOW (ref 135–145)

## 2020-10-12 LAB — CBC
HCT: 21.8 % — ABNORMAL LOW (ref 36.0–46.0)
Hemoglobin: 6.9 g/dL — CL (ref 12.0–15.0)
MCH: 26.2 pg (ref 26.0–34.0)
MCHC: 31.7 g/dL (ref 30.0–36.0)
MCV: 82.9 fL (ref 80.0–100.0)
Platelets: 121 10*3/uL — ABNORMAL LOW (ref 150–400)
RBC: 2.63 MIL/uL — ABNORMAL LOW (ref 3.87–5.11)
RDW: 17.1 % — ABNORMAL HIGH (ref 11.5–15.5)
WBC: 12.8 10*3/uL — ABNORMAL HIGH (ref 4.0–10.5)
nRBC: 0 % (ref 0.0–0.2)

## 2020-10-12 LAB — MAGNESIUM: Magnesium: 2.2 mg/dL (ref 1.7–2.4)

## 2020-10-12 NOTE — Progress Notes (Signed)
Date and time results received: 10/12/20 11:07am   Test: HGB Critical Value: 6.9  Name of Provider Notified: Dr. Janee Morn  Orders Received? Or Actions Taken?: Patient states that she is "Jehovah Witness and does not wish to receive a blood transfusion unless it is a life threatening situation". Dr. Janee Morn made aware of patients wishes.

## 2020-10-12 NOTE — Progress Notes (Signed)
This RN attempted to draw morning labs from PICC line. Patient refused saying " not right now". RN educated patient on importance of lab work. Patient still refusing. Will make MD aware.

## 2020-10-12 NOTE — Progress Notes (Signed)
Patient upset that she keeps getting random drug screening, toxicology was obtained yesterday 6/10.  Patient also upset that even though her toxicology was negative a telemonitor was ordered for her room.  She refused telemonitor stating "I'm not doing anything wrong and I like my privacy"   Patient has requested to leave AMA, I have talked to the patient at length about the importance of staying and getting her IV antibiotics. She is very determined to leave. Her brother Andrea King is at bedside.   Order has been placed to have midline removed and patient has signed AMA paperwork. Dr. Janee Morn was paged and made aware of patients desire to leave.

## 2020-10-12 NOTE — Progress Notes (Signed)
Attempted to draw blood from PICC line for AM labs.  Patient states "I don't want no blood drawn from me". Patient educated on importance of lab work for continued care.  She still refuses. Primary RN made aware.

## 2020-10-12 NOTE — Progress Notes (Signed)
PROGRESS NOTE    Andrea King  BDZ:[id number] DOB: 08-Jul-1975 DOA: 09/29/2020 PCP: Alberteen Spindle, CNM   Chief Complaint  Patient presents with   Chest Pain    Brief Narrative:  45 year old female with history of hep C, liver cirrhosis, IVDA with heroin use, history of MSSA bacteremia and tricuspid valve endocarditis status post valve replacement (she had 2 surgeries, 1 for repair and the second 1 for valve replacement) recently admitted at [facility] on 5/23 with abdominal pain, altered mental status.  On 5/24 she had respiratory failure and had to be intubated, eventually extubated 5/29 and then when she woke up she decided to leave AMA and apparently came straight to [facility], ER.  ID was consulted.     Assessment & Plan:   Principal Problem:   Endocarditis of prosthetic tricuspid valve (HCC) Active Problems:   Opiate abuse, continuous (HCC)   Protein-calorie malnutrition, severe   Hypokalemia   Anemia in other chronic diseases classified elsewhere   Chronic hepatitis C without hepatic coma (HCC)   Cirrhosis of liver without ascites (HCC)   Endocarditis   Vaginal candidiasis  1 sepsis secondary to tricuspid valve endocarditis, POA -Patient initially admitted at [facility] however left AMA on 09/29/2020 and presented to [facility]. -Patient noted to have undergone a TEE 09/26/2020 with a EF of 60 to 65%, normal RV, evidence of tricuspid valve endocarditis with a 1 cm vegetation and mild to moderate TVR. -Patient seen in consultation by ID.  Repeat cultures done negative however patient with history of MRSA/Serratia bacteremia. -Due to altered mental status patient underwent LP 5/27 which was unremarkable and no concerns for CNS infection. -Patient being followed by ID and patient with history of MSSA TV endocarditis in the previous year, no culture growth. -ID recommending broad-spectrum antibiotics for 6 weeks with Vanco and cefepime and synergistic  gentamicin. -Patient refusing oral rifampin secondary to intolerance.   -ID recommended that after treatment completion, with prosthetic valve patient will need suppressive antibiotics for treatment with a negative culture and recommending doxycycline and Augmentin long-term after completion of initial IV antibiotics. -ID following and appreciate input and recommendations.  2.  Neck pain -Patient noted to have felt something moving at the site of her previous surgery but asymptomatic clinically.  Outpatient follow-up supportive care.  3.  Hypokalemia/hypomagnesemia -Labs pending as patient refused initially early on this morning.   4.  Anemia Patient with no overt bleeding. -Anemia panel with iron level of 27, TIBC of 281, folate of 8.3, ferritin of 60, vitamin B12 of 358.   -Patient initially refused labs this morning however states blood work was obtained and currently pending.  -Transfusion of PRBC ordered on 10/10/2020, however patient stating she was raised as a Jehovah's Witness, currently feels okay and refusing transfusion of packed red blood cells at this time unless she is on the verge of death per patient.  -Continue oral iron supplementation. -Follow H&H.    5.  Multifocal pneumonia  - Likely secondary to septic emboli. -Chest x-ray on admission with improvement.   -Patient currently afebrile.  Not hypoxic.   -Continue empiric antibiotics as #1.   6.  Polysubstance abuse/IVDU -Patient noted on admission to be concerned about withdrawals. -Patient currently on buprenorphine. -Was notified by RN around 1:30 PM on 10/11/2020, due to concerns that patient may have received some substance from brother at bedside.   -UDS done was negative.   -Continue telemetry sitter.   7.  Liver  cirrhosis/history of hep C -Stable.  Outpatient follow-up.   8.  Cervical neck pain -CT scan from 09/2020: Postop changes from prior anterior fusion at C3-C7, with posterior fusion at C2-T2, -CT scan  from 5/22: Postoperative changes from prior anterior fusion at C3 through C7, with posterior fusion at C2 through T2. Periprosthetic lucency about the transpedicular screws on the left at C4 and bilaterally at T1 and T2, suggestive of loosening. -surgery was at [facility] so will need to follow up there - no neurologic deficits.  9.  Borderline blood pressure -Likely secondary to acute infection of endocarditis in the setting of anemia. -Patient denies any chest pain, no dizziness, no lightheadedness.  -Patient stating blood pressure usually is low.   -PRBC transfusion recommended and ordered however patient refusing at this time stating she was raised as a Jehovah's Witness and would not get any blood products unless she is on the verge of death.  10.  Vaginal candidiasis/?  Early oral thrush -Status post Diflucan 150 mg p.o. x1.  -Continue clotrimazole cream nightly x7 days. -Nystatin swish and swallow x7 days.   DVT prophylaxis: SCDs Code Status: Full Family Communication: Updated patient.  Brother at bedside.  Disposition:   Status is: Inpatient    Dispo: The patient is from: Home              Anticipated d/c is to: TBD              Patient currently receiving IV antibiotics through July 8-9, borderline blood pressure, anemia, not stable for discharge.   Difficult to place patient yes       Consultants:  ID: Dr. Drue Second 09/30/2020   Procedures:  Chest x-ray 09/29/2020  Antimicrobials:  IV cefepime 09/29/2020>>>> 11/09/2020 IV gentamicin 09/29/2020>>>> 10/11/2020 IV rifampin 09/29/2020>>>> 10/07/2020 Oral rifampin 09/30/2020>>>> 10/04/2020   Subjective: Sleeping but arousable.  Denies chest pain.  No shortness of breath.  No abdominal pain.  Asking whether if she wants to leave AMA whether antibiotics could be written for.  States staff thought she was on drugs or something when she was not.  Stated had a urine drug screen done yesterday which she passed.    Objective: Vitals:    10/11/20 2300 10/11/20 2319 10/12/20 0322 10/12/20 0736  BP:   (!) 95/56 106/64  Pulse:  (!) 104 (!) 106 (!) 109  Resp: 17 17 20 18   Temp:  99 F (37.2 C) 98.8 F (37.1 C) 98.1 F (36.7 C)  TempSrc:  Oral Oral Oral  SpO2:   98% 98%  Weight:      Height:        Intake/Output Summary (Last 24 hours) at 10/12/2020 1038 Last data filed at 10/11/2020 2204 Gross per 24 hour  Intake 290 ml  Output --  Net 290 ml    Filed Weights   09/30/20 0015  Weight: 64.1 kg    Examination:  General exam: : NAD Respiratory system: CTA B.  No wheezes, no rhonchi.  Speaking in full sentences.  Normal respiratory effort. Cardiovascular system: Regular rate and rhythm no murmurs rubs or gallops.  No JVD.  No lower extremity edema.  Gastrointestinal system: Abdomen soft, nontender, nondistended, positive bowel sounds.  No rebound.  No guarding. Central nervous system: Alert and oriented. No focal neurological deficits. Extremities: Symmetric 5 x 5 power. Skin: No rashes, lesions or ulcers Psychiatry: Judgement and insight appear normal. Mood & affect appropriate.  Data Reviewed: I have personally reviewed following labs and imaging  studies  CBC: Recent Labs  Lab 10/09/20 0303 10/09/20 1540 10/10/20 0329 10/11/20 0341  WBC 10.1  --  9.7 11.0*  HGB 7.4* 7.2* 7.3* 7.2*  HCT 23.8* 23.1* 23.5* 23.0*  MCV 82.6  --  83.0 83.0  PLT 156  --  133* 127*     Basic Metabolic Panel: Recent Labs  Lab 10/09/20 0303 10/09/20 0925 10/11/20 0341  NA 133*  --  133*  K 3.0*  --  2.9*  CL 102  --  101  CO2 22  --  24  GLUCOSE 151*  --  110*  BUN 18  --  15  CREATININE 0.78  --  0.84  CALCIUM 8.7*  --  8.6*  MG  --  2.0 2.2     GFR: Estimated Creatinine Clearance: 73.6 mL/min (by C-G formula based on SCr of 0.84 mg/dL).  Liver Function Tests: No results for input(s): AST, ALT, ALKPHOS, BILITOT, PROT, ALBUMIN in the last 168 hours.   CBG: No results for input(s): GLUCAP in the last 168  hours.   No results found for this or any previous visit (from the past 240 hour(s)).        Radiology Studies: No results found.      Scheduled Meds:  acidophilus  2 capsule Oral Daily   buprenorphine  8 mg Sublingual Daily   Chlorhexidine Gluconate Cloth  6 each Topical Daily   clotrimazole  1 Applicatorful Vaginal QHS   feeding supplement  237 mL Oral BID BM   iron polysaccharides  150 mg Oral Daily   metoprolol tartrate  12.5 mg Oral BID   nystatin  5 mL Oral QID   pantoprazole  40 mg Oral Q0600   sodium chloride flush  10-40 mL Intracatheter Q12H   traZODone  50 mg Oral QHS   Continuous Infusions:  ceFEPime (MAXIPIME) IV 2 g (10/12/20 4166)   promethazine (PHENERGAN) injection (IM or IVPB) Stopped (10/04/20 2344)   sodium chloride     vancomycin 1,000 mg (10/12/20 0825)     LOS: 12 days    Time spent: 35 minutes    Ramiro Harvest, MD [facility]   To contact the attending provider between 7A-7P or the covering provider during after hours 7P-7A, please log into the web site [email or web address] and access using universal [place] password for that web site. If you do not have the password, please call the hospital operator.  10/12/2020, 10:38 AM

## 2020-10-12 NOTE — Plan of Care (Signed)

## 2020-10-12 NOTE — Progress Notes (Signed)
Patient left AMA with her Brother Link Snuffer.

## 2020-10-12 NOTE — Discharge Summary (Signed)
Physician Discharge Summary  EUSTACIA URBANEK ZOX:[id number] DOB: 06-Jan-1976 DOA: 09/29/2020  PCP: Alberteen Spindle, CNM  Admit date: 09/29/2020 Discharge date: 10/12/2020   Patient left AMA  Time spent: 20 minutes  Recommendations for Outpatient Follow-up:  Left AMA   Discharge Diagnoses:  Principal Problem:   Endocarditis of prosthetic tricuspid valve (HCC) Active Problems:   Opiate abuse, continuous (HCC)   Protein-calorie malnutrition, severe   Hypokalemia   Anemia in other chronic diseases classified elsewhere   Chronic hepatitis C without hepatic coma (HCC)   Cirrhosis of liver without ascites (HCC)   Endocarditis   Vaginal candidiasis   Discharge Condition: Left AMA  Diet recommendation: Left AMA  Filed Weights   09/30/20 0015  Weight: 64.1 kg    History of present illness:  HPI per Dr. Loel Ro is a 45 y.o. female with medical history significant of anemia, asthma, DJD, hepatitis C, liver cirrhosis, IVDA substance abuse who was admitted [facility] on 09/23/2020 due to a spontaneous bacterial peritonitis and MSSA bacteremia with tricuspid valve endocarditis, septic pulmonary emboli as well as discitis and osteomyelitis after presenting to the ER with altered mental status, confusion and abdominal pain.  On 09/24/2020 the patient had respiratory failure and had to be intubated.  While intubated, the care team [facility] tried to transfer her to [facility] cardiac surgery service, but they declined the transfer stating that she was not a surgical candidate.  She was extubated on 09/29/2020 and later once she was more alert and oriented decided to signed AMA.  She stated that after she was given a lecture by her family members she was subsequently brought here by [facility] to resume therapy.  When asked about her review of systems patient stated that she had been on induced coma and did not know about her symptoms.  She acted surprised to know that she has liver cirrhosis.   ED  Course: Initial vital signs were temperature 99.9 F, pulse 99, respirations 22, BP 130/71 mmHg O2 sat 97% on room air.  The patient was restarted on cefepime, vancomycin, gentamicin and rifampin.  She also received IV fluids, 4 mg of morphine and potassium chloride 40 mEq x 1.   Lab work: UDS was positive for opiates and benzodiazepines.  Urinalysis had moderate hemoglobinuria, but was otherwise unremarkable.  CBC showed a white count of 24.3, hemoglobin 8.4 g/dL and platelets 811.  Unremarkable i-STAT hCG, troponin, lipase, acetaminophen, salicylate and alcohol level.  MRSA by PCR was negative.  SARS coronavirus 2 was negative.   Imaging: Portable 1 view chest radiograph showed improved aeration bilaterally with resolution of previously seen airspace opacities.  There is a small left-sided pleural effusion, which has decreased compared to the previous exam.  Please see image and full radiology report for further detail.   Hospital Course:  Patient left AMA For hospital course please see progress note from 10/12/2020.  Procedures: Chest x-ray 09/29/2020  Consultations: ID: Dr. Drue Second 09/30/2020  Discharge Exam: Vitals:   10/12/20 0736 10/12/20 1120  BP: 106/64 108/67  Pulse: (!) 109 99  Resp: 18 13  Temp: 98.1 F (36.7 C) 99 F (37.2 C)  SpO2: 98% 98%    General: Left AMA Cardiovascular: Left AMA Respiratory: Left AMA  Discharge Instructions Patient left AMA    No Known Allergies    The results of significant diagnostics from this hospitalization (including imaging, microbiology, ancillary and laboratory) are listed below for reference.    Significant Diagnostic Studies: CT ABDOMEN  PELVIS WO CONTRAST  Addendum Date: 09/23/2020   ADDENDUM REPORT: 09/23/2020 03:32 ADDENDUM: Please note findings and impression should note cirrhosis with portal hypertension. Electronically Signed   By: Tish Frederickson M.D.   On: 09/23/2020 03:32   Result Date: 09/23/2020 CLINICAL DATA:   Nonlocalized acute abdominal pain. History of septic emboli in 2021. EXAM: CT ABDOMEN AND PELVIS WITHOUT CONTRAST TECHNIQUE: Multidetector CT imaging of the abdomen and pelvis was performed following the standard protocol without IV contrast. COMPARISON:  Chest x-ray 09/18/2018, CT abdomen pelvis 04/08/2013 FINDINGS: Lower chest: Subsegmental atelectasis with question of a 1.6 x 0.6 cm lesion within the right lower lobe. Question subpleural micronodule just superiorly (3:1). Subpleural pulmonary micronodule within the left lower lobe (3:6). 6 mm pulmonary nodule within the right middle lobe (3:6). Subpleural nodule measuring 5 mm within the right middle lobe (3:8). Replace tricuspid valve.  Retained epicardial pacing wires. Hepatobiliary: The liver is enlarged in size measuring up to 24 cm. No focal liver abnormality. Question of gallbladder wall thickening and pericholecystic fluid. No CT findings gallbladder stones. No biliary dilatation. Pancreas: No focal lesion. Normal pancreatic contour. No surrounding inflammatory changes. No main pancreatic ductal dilatation. Spleen: The spleen is enlarged measuring up to 15.5 cm. Question inferior splenic lesion versus adjacent colon (5:56). Adrenals/Urinary Tract: No adrenal nodule bilaterally. No nephrolithiasis, no hydronephrosis, and no contour-deforming renal mass. No ureterolithiasis or hydroureter. The urinary bladder is unremarkable. Stomach/Bowel: Stomach is within normal limits. No evidence of bowel wall thickening or dilatation. No pneumatosis. Appendix appears normal. Vascular/Lymphatic: No abdominal aorta or iliac aneurysm. Mild atherosclerotic plaque of the aorta and its branches. No abdominal, pelvic, or inguinal lymphadenopathy. Reproductive: Uterus and bilateral adnexa/ovaries are unremarkable. Other: Trace free perihepatic fluid. Trace free pelvic fluid. No intraperitoneal free gas. No organized fluid collection. Musculoskeletal: Small left inguinal hernia  containing fat and likely decompressed loop of small bowel. No suspicious lytic or blastic osseous lesions. No acute displaced fracture. 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 Fusion of the L5-S1 vertebral bodies. IMPRESSION: 1. Question gallbladder wall thickening and pericholecystic fluid. Recommend right upper quadrant ultrasound for a more sensitive evaluation. 2. Hepatosplenomegaly with question of an inferior splenic lesion versus adjacent small bowel. 3. Small left inguinal hernia containing fat and likely decompressed short loop of small bowel. No definite associated bowel obstruction. Limited evaluation for ischemia on this noncontrast study with no definite pneumatosis identified. 4. Trace perihepatic and pelvic ascites. 5. Visualized lungs demonstrate multiple pulmonary nodules. Non-contrast chest CT at 3-6 months is recommended. If the nodules are stable at time of repeat CT, then future CT at 18-24 months (from today's scan) is considered optional for low-risk patients, but is recommended for high-risk patients. This recommendation follows the consensus statement: Guidelines for Management of Incidental Pulmonary Nodules Detected on CT Images: From the Fleischner Society 2017; Radiology 2017; 284:228-243. Electronically Signed: By: Tish Frederickson M.D. On: 09/23/2020 02:36   DG Chest 1 View  Result Date: 09/26/2020 CLINICAL DATA:  Central line placement EXAM: CHEST  1 VIEW COMPARISON:  09/25/2020 FINDINGS: Endotracheal tube with tip 1 cm above the carina. Right IJ line with tip near the upper SVC. The enteric tube reaches the stomach. Normal heart size and mediastinal contours accounting for rotation. Tricuspid valve replacement. Indistinct airspace disease of the bilateral chest with small pleural effusions. There is multifocal pneumonia by thoracic spine CT yesterday. IMPRESSION: 1. Unremarkable hardware positioning. 2. Multifocal pneumonia.  Electronically Signed  By: Marnee Spring M.D.   On: 09/26/2020 07:50   DG Abd 1 View  Result Date: 09/25/2020 CLINICAL DATA:  Acute respiratory failure with hypoxia and OG tube placement EXAM: PORTABLE AP CHEST 1 VIEW Portable AP semi erect after view COMPARISON:  Chest x-ray 09/24/2020, chest x-ray 09/23/2020. FINDINGS: Endotracheal tube terminates 2 cm above the carina. Enteric tube courses below the hemidiaphragm with tip and side port overlying the gastric lumen. Right internal jugular central venous catheter with tip overlying the expected region of the superior cavoatrial junction. Retained epicardial pacing wires. The heart size and mediastinal contours are unchanged. Cardiac valve replacement again noted. improved aeration of the right mid lung zone with persistent bilateral patchy airspace and interstitial opacities. No pulmonary edema. No pleural effusion. No pneumothorax. Nonobstructive bowel gas pattern. No acute osseous abnormality. Partially visualized surgical hardware of the cervical spine. IMPRESSION: 1. Improved aeration of the right mid lung zone with persistent bilateral patchy airspace and interstitial opacities. 2. Lines and tubes are stable in position. Electronically Signed   By: Tish Frederickson M.D.   On: 09/25/2020 04:38   CT HEAD WO CONTRAST  Result Date: 09/25/2020 CLINICAL DATA:  Anoxic brain damage. EXAM: CT HEAD WITHOUT CONTRAST TECHNIQUE: Contiguous axial images were obtained from the base of the skull through the vertex without intravenous contrast. COMPARISON:  Head CT examinations 09/23/2020 and earlier FINDINGS: Brain: Cerebral volume is normal for age. There is no acute intracranial hemorrhage. No demarcated cortical infarct. Gray-white differentiation is preserved. No extra-axial fluid collection. No evidence of intracranial mass. No midline shift. Vascular: No hyperdense vessel. Skull: Normal. Negative for fracture or focal lesion. Sinuses/Orbits: Visualized orbits  show no acute finding. No significant paranasal sinus disease at the imaged levels. IMPRESSION: No CT evidence of acute intracranial abnormality. A non-contrast brain MRI would have greater sensitivity for acute hypoxic/ischemic injury. Electronically Signed   By: Jackey Loge DO   On: 09/25/2020 19:25   CT Head Wo Contrast  Result Date: 09/23/2020 CLINICAL DATA:  Delirium EXAM: CT HEAD WITHOUT CONTRAST TECHNIQUE: Contiguous axial images were obtained from the base of the skull through the vertex without intravenous contrast. COMPARISON:  None. FINDINGS: Brain: There is no mass, hemorrhage or extra-axial collection. The size and configuration of the ventricles and extra-axial CSF spaces are normal. The brain parenchyma is normal, without acute or chronic infarction. Vascular: No abnormal hyperdensity of the major intracranial arteries or dural venous sinuses. No intracranial atherosclerosis. Skull: The visualized skull base, calvarium and extracranial soft tissues are normal. Sinuses/Orbits: No fluid levels or advanced mucosal thickening of the visualized paranasal sinuses. No mastoid or middle ear effusion. The orbits are normal. IMPRESSION: Normal head CT. Electronically Signed   By: Deatra Robinson M.D.   On: 09/23/2020 03:49   CT CERVICAL SPINE WO CONTRAST  Result Date: 09/25/2020 CLINICAL DATA:  Initial evaluation for epidural abscess. EXAM: CT CERVICAL, THORACIC, AND LUMBAR SPINE WITHOUT CONTRAST TECHNIQUE: Multidetector CT imaging of the cervical, thoracic and lumbar spine was performed without intravenous contrast. Multiplanar CT image reconstructions were also generated. COMPARISON:  None. FINDINGS: CT CERVICAL SPINE FINDINGS Alignment: Straightening of the normal cervical lordosis. No listhesis. Skull base and vertebrae: Visualized skull base intact without abnormality. Normal C1-2 articulations are preserved. Dens is intact. Patient is status post prior anterior fusion at C3 through C7, with  posterior fusion at C2 through T2. Prior corpectomy with strut in place at C4 through C6. Periprosthetic lucency about the transpedicular screws at T1 and  T2 suggestive of loosening. Additional lucency seen about the left transpedicular screw at C4, also suggestive of loosening. Hardware itself is intact. No visible fracture or other acute osseous abnormality. No discrete or worrisome osseous lesions. No findings to suggest osteomyelitis discitis. Soft tissues and spinal canal: Endotracheal and enteric tubes in place. Associated layering fluid within the hypopharynx. Right-sided central venous catheter in place. Paraspinous soft tissues demonstrate no other acute finding. No visible epidural abscess or other collection within the spinal canal, although evaluation somewhat limited by streak artifact from fusion hardware. Disc levels: Postsurgical changes as above. No residual spinal stenosis. Upper chest: Emphysema. Superimposed layering bilateral pleural effusions. CT THORACIC SPINE FINDINGS Alignment: Physiologic with preservation of the normal thoracic kyphosis. No listhesis. Vertebrae: Anterior posterior fusion extending to the T2 level again noted. Vertebral body height maintained without fracture. No findings to suggest osteomyelitis discitis. No discrete or worrisome osseous lesions. Paraspinal and other soft tissues: Mild diffuse anasarca noted within the visualized soft tissues. Soft tissues otherwise unremarkable without discrete collection. Endotracheal and enteric tubes in place. Right-sided central venous catheter in place. Emphysematous changes noted within the lungs. Layering bilateral pleural effusions. Patchy multifocal opacity seen throughout both lungs, concerning for multifocal pneumonia. Disc levels: No visible epidural abscess or other collection. No significant disc pathology or stenosis seen within the thoracic spine. CT LUMBAR SPINE FINDINGS Segmentation: Standard. Alignment: Chronic  bilateral pars defects at L5 with associated 5 mm spondylolisthesis of L5 on S1. Alignment otherwise normal with preservation of the normal lumbar lordosis. Vertebrae: Chronic fusion/ankylosis across the L5-S1 interspace. Vertebral body height maintained without acute or chronic fracture. Visualized sacrum and pelvis intact. No discrete or worrisome osseous lesions. No findings to suggest acute osteomyelitis discitis or septic arthritis. Paraspinal and other soft tissues: Diffuse anasarca. Enteric tube partially visualized. Additional catheter tubing partially visualized overlying the mid pelvis. Probable right femoral approach central venous catheter partially visualized as well. Diffuse stranding with moderate volume free fluid within the visualized abdomen. No visible epidural abscess or other collection within the spinal canal itself. Disc levels: T12-L1: Mild disc bulge with annular calcification. No significant stenosis. L1-2:  Unremarkable. L2-3:  Negative interspace.  Mild facet hypertrophy.  No stenosis. L3-4:  Negative interspace.  Mild facet hypertrophy.  No stenosis. L4-5: Negative interspace. Moderate to advanced right worse than left facet hypertrophy. No significant stenosis. 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 Moderate to severe facet arthrosis. Spinal canal remains patent. Moderate to severe bilateral L5 foraminal narrowing. IMPRESSION: 1. No CT evidence for acute abnormality within the cervical, thoracic, or lumbar spine. No visible epidural abscess or other collection. 2. Postoperative changes from prior anterior fusion at C3 through C7, with posterior fusion at C2 through T2. Periprosthetic lucency about the transpedicular screws on the left at C4 and bilaterally at T1 and T2, suggestive of loosening. 3. Layering bilateral pleural effusions with patchy multifocal opacity throughout both lungs, concerning for multifocal pneumonia. 4. Diffuse stranding  throughout the visualized abdomen and pelvis with associated moderate volume free fluid. Anasarca. 5. 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 Associated moderate to severe bilateral L5 foraminal stenosis. 6. Emphysema (ICD10-J43.9). Electronically Signed   By: Rise Mu M.D.   On: 09/25/2020 19:33   CT THORACIC SPINE WO CONTRAST  Result Date: 09/25/2020 CLINICAL DATA:  Initial evaluation for epidural abscess. EXAM: CT CERVICAL, THORACIC, AND LUMBAR SPINE WITHOUT CONTRAST TECHNIQUE: Multidetector CT imaging of the  cervical, thoracic and lumbar spine was performed without intravenous contrast. Multiplanar CT image reconstructions were also generated. COMPARISON:  None. FINDINGS: CT CERVICAL SPINE FINDINGS Alignment: Straightening of the normal cervical lordosis. No listhesis. Skull base and vertebrae: Visualized skull base intact without abnormality. Normal C1-2 articulations are preserved. Dens is intact. Patient is status post prior anterior fusion at C3 through C7, with posterior fusion at C2 through T2. Prior corpectomy with strut in place at C4 through C6. Periprosthetic lucency about the transpedicular screws at T1 and T2 suggestive of loosening. Additional lucency seen about the left transpedicular screw at C4, also suggestive of loosening. Hardware itself is intact. No visible fracture or other acute osseous abnormality. No discrete or worrisome osseous lesions. No findings to suggest osteomyelitis discitis. Soft tissues and spinal canal: Endotracheal and enteric tubes in place. Associated layering fluid within the hypopharynx. Right-sided central venous catheter in place. Paraspinous soft tissues demonstrate no other acute finding. No visible epidural abscess or other collection within the spinal canal, although evaluation somewhat limited by streak artifact from fusion hardware. Disc levels: Postsurgical changes as above. No residual spinal  stenosis. Upper chest: Emphysema. Superimposed layering bilateral pleural effusions. CT THORACIC SPINE FINDINGS Alignment: Physiologic with preservation of the normal thoracic kyphosis. No listhesis. Vertebrae: Anterior posterior fusion extending to the T2 level again noted. Vertebral body height maintained without fracture. No findings to suggest osteomyelitis discitis. No discrete or worrisome osseous lesions. Paraspinal and other soft tissues: Mild diffuse anasarca noted within the visualized soft tissues. Soft tissues otherwise unremarkable without discrete collection. Endotracheal and enteric tubes in place. Right-sided central venous catheter in place. Emphysematous changes noted within the lungs. Layering bilateral pleural effusions. Patchy multifocal opacity seen throughout both lungs, concerning for multifocal pneumonia. Disc levels: No visible epidural abscess or other collection. No significant disc pathology or stenosis seen within the thoracic spine. CT LUMBAR SPINE FINDINGS Segmentation: Standard. Alignment: Chronic bilateral pars defects at L5 with associated 5 mm spondylolisthesis of L5 on S1. Alignment otherwise normal with preservation of the normal lumbar lordosis. Vertebrae: Chronic fusion/ankylosis across the L5-S1 interspace. Vertebral body height maintained without acute or chronic fracture. Visualized sacrum and pelvis intact. No discrete or worrisome osseous lesions. No findings to suggest acute osteomyelitis discitis or septic arthritis. Paraspinal and other soft tissues: Diffuse anasarca. Enteric tube partially visualized. Additional catheter tubing partially visualized overlying the mid pelvis. Probable right femoral approach central venous catheter partially visualized as well. Diffuse stranding with moderate volume free fluid within the visualized abdomen. No visible epidural abscess or other collection within the spinal canal itself. Disc levels: T12-L1: Mild disc bulge with annular  calcification. No significant stenosis. L1-2:  Unremarkable. L2-3:  Negative interspace.  Mild facet hypertrophy.  No stenosis. L3-4:  Negative interspace.  Mild facet hypertrophy.  No stenosis. L4-5: Negative interspace. Moderate to advanced right worse than left facet hypertrophy. No significant stenosis. 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 Moderate to severe facet arthrosis. Spinal canal remains patent. Moderate to severe bilateral L5 foraminal narrowing. IMPRESSION: 1. No CT evidence for acute abnormality within the cervical, thoracic, or lumbar spine. No visible epidural abscess or other collection. 2. Postoperative changes from prior anterior fusion at C3 through C7, with posterior fusion at C2 through T2. Periprosthetic lucency about the transpedicular screws on the left at C4 and bilaterally at T1 and T2, suggestive of loosening. 3. Layering bilateral pleural effusions with patchy multifocal opacity throughout both lungs, concerning for multifocal pneumonia. 4. Diffuse  stranding throughout the visualized abdomen and pelvis with associated moderate volume free fluid. Anasarca. 5. 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 Associated moderate to severe bilateral L5 foraminal stenosis. 6. Emphysema (ICD10-J43.9). Electronically Signed   By: Rise MuBenjamin  McClintock M.D.   On: 09/25/2020 19:33   CT LUMBAR SPINE WO CONTRAST  Result Date: 09/25/2020 CLINICAL DATA:  Initial evaluation for epidural abscess. EXAM: CT CERVICAL, THORACIC, AND LUMBAR SPINE WITHOUT CONTRAST TECHNIQUE: Multidetector CT imaging of the cervical, thoracic and lumbar spine was performed without intravenous contrast. Multiplanar CT image reconstructions were also generated. COMPARISON:  None. FINDINGS: CT CERVICAL SPINE FINDINGS Alignment: Straightening of the normal cervical lordosis. No listhesis. Skull base and vertebrae: Visualized skull base intact  without abnormality. Normal C1-2 articulations are preserved. Dens is intact. Patient is status post prior anterior fusion at C3 through C7, with posterior fusion at C2 through T2. Prior corpectomy with strut in place at C4 through C6. Periprosthetic lucency about the transpedicular screws at T1 and T2 suggestive of loosening. Additional lucency seen about the left transpedicular screw at C4, also suggestive of loosening. Hardware itself is intact. No visible fracture or other acute osseous abnormality. No discrete or worrisome osseous lesions. No findings to suggest osteomyelitis discitis. Soft tissues and spinal canal: Endotracheal and enteric tubes in place. Associated layering fluid within the hypopharynx. Right-sided central venous catheter in place. Paraspinous soft tissues demonstrate no other acute finding. No visible epidural abscess or other collection within the spinal canal, although evaluation somewhat limited by streak artifact from fusion hardware. Disc levels: Postsurgical changes as above. No residual spinal stenosis. Upper chest: Emphysema. Superimposed layering bilateral pleural effusions. CT THORACIC SPINE FINDINGS Alignment: Physiologic with preservation of the normal thoracic kyphosis. No listhesis. Vertebrae: Anterior posterior fusion extending to the T2 level again noted. Vertebral body height maintained without fracture. No findings to suggest osteomyelitis discitis. No discrete or worrisome osseous lesions. Paraspinal and other soft tissues: Mild diffuse anasarca noted within the visualized soft tissues. Soft tissues otherwise unremarkable without discrete collection. Endotracheal and enteric tubes in place. Right-sided central venous catheter in place. Emphysematous changes noted within the lungs. Layering bilateral pleural effusions. Patchy multifocal opacity seen throughout both lungs, concerning for multifocal pneumonia. Disc levels: No visible epidural abscess or other collection. No  significant disc pathology or stenosis seen within the thoracic spine. CT LUMBAR SPINE FINDINGS Segmentation: Standard. Alignment: Chronic bilateral pars defects at L5 with associated 5 mm spondylolisthesis of L5 on S1. Alignment otherwise normal with preservation of the normal lumbar lordosis. Vertebrae: Chronic fusion/ankylosis across the L5-S1 interspace. Vertebral body height maintained without acute or chronic fracture. Visualized sacrum and pelvis intact. No discrete or worrisome osseous lesions. No findings to suggest acute osteomyelitis discitis or septic arthritis. Paraspinal and other soft tissues: Diffuse anasarca. Enteric tube partially visualized. Additional catheter tubing partially visualized overlying the mid pelvis. Probable right femoral approach central venous catheter partially visualized as well. Diffuse stranding with moderate volume free fluid within the visualized abdomen. No visible epidural abscess or other collection within the spinal canal itself. Disc levels: T12-L1: Mild disc bulge with annular calcification. No significant stenosis. L1-2:  Unremarkable. L2-3:  Negative interspace.  Mild facet hypertrophy.  No stenosis. L3-4:  Negative interspace.  Mild facet hypertrophy.  No stenosis. L4-5: Negative interspace. Moderate to advanced right worse than left facet hypertrophy. No significant stenosis. 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 Moderate to severe facet arthrosis.  Spinal canal remains patent. Moderate to severe bilateral L5 foraminal narrowing. IMPRESSION: 1. No CT evidence for acute abnormality within the cervical, thoracic, or lumbar spine. No visible epidural abscess or other collection. 2. Postoperative changes from prior anterior fusion at C3 through C7, with posterior fusion at C2 through T2. Periprosthetic lucency about the transpedicular screws on the left at C4 and bilaterally at T1 and T2, suggestive of loosening. 3. Layering  bilateral pleural effusions with patchy multifocal opacity throughout both lungs, concerning for multifocal pneumonia. 4. Diffuse stranding throughout the visualized abdomen and pelvis with associated moderate volume free fluid. Anasarca. 5. 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 Associated moderate to severe bilateral L5 foraminal stenosis. 6. Emphysema (ICD10-J43.9). Electronically Signed   By: Rise Mu M.D.   On: 09/25/2020 19:33   DG Chest Port 1 View  Result Date: 09/29/2020 CLINICAL DATA:  Possible sepsis EXAM: PORTABLE CHEST 1 VIEW COMPARISON:  09/26/2020, CT 11/13/2015 FINDINGS: Extensive surgical hardware in the cervicothoracic spine. Post sternotomy changes and valve prosthesis. Interval extubation and removal of esophageal tubes. Trace left-sided pleural effusion, decreased compared to prior. Improved aeration bilaterally with clearing of previously noted airspace disease. Stable cardiomediastinal silhouette. No pneumothorax IMPRESSION: Improved aeration bilaterally with resolution of previously noted airspace opacities. Small left-sided pleural effusion, decreased compared to prior Electronically Signed   By: Jasmine Pang M.D.   On: 09/29/2020 20:33   DG Chest Port 1 View  Result Date: 09/25/2020 CLINICAL DATA:  Acute respiratory failure with hypoxia and OG tube placement EXAM: PORTABLE AP CHEST 1 VIEW Portable AP semi erect after view COMPARISON:  Chest x-ray 09/24/2020, chest x-ray 09/23/2020. FINDINGS: Endotracheal tube terminates 2 cm above the carina. Enteric tube courses below the hemidiaphragm with tip and side port overlying the gastric lumen. Right internal jugular central venous catheter with tip overlying the expected region of the superior cavoatrial junction. Retained epicardial pacing wires. The heart size and mediastinal contours are unchanged. Cardiac valve replacement again noted. improved aeration of the right mid  lung zone with persistent bilateral patchy airspace and interstitial opacities. No pulmonary edema. No pleural effusion. No pneumothorax. Nonobstructive bowel gas pattern. No acute osseous abnormality. Partially visualized surgical hardware of the cervical spine. IMPRESSION: 1. Improved aeration of the right mid lung zone with persistent bilateral patchy airspace and interstitial opacities. 2. Lines and tubes are stable in position. Electronically Signed   By: Tish Frederickson M.D.   On: 09/25/2020 04:38   DG Chest Port 1 View  Result Date: 09/24/2020 CLINICAL DATA:  Intubation. EXAM: PORTABLE CHEST 1 VIEW COMPARISON:  09/23/2020. FINDINGS: Endotracheal tube tip noted 3 cm above the carina. NG tube tip noted on the left hemidiaphragm. Right IJ line noted at cavoatrial junction. Prior CABG. Heart size stable. Bilateral pulmonary infiltrates/edema. Findings have progressed slightly from prior exam. No pleural effusion or pneumothorax. Prior cervicothoracic spine fusion. IMPRESSION: 1.  Lines and tubes in good anatomic position. 2. Prior CABG. Heart size stable. Diffuse bilateral pulmonary infiltrates/edema, slightly progressed from prior exam. Electronically Signed   By: Maisie Fus  Register   On: 09/24/2020 12:34   DG Chest Port 1 View  Result Date: 09/23/2020 CLINICAL DATA:  45 year old female with fever and altered mental status. History of endocarditis, discitis. EXAM: PORTABLE CHEST 1 VIEW COMPARISON:  09/18/2019 portable chest and earlier. FINDINGS: Portable AP upright view at 0805 hours. Extensive anterior and posterior lower cervical and upper thoracic spine fusion hardware appears stable.  Prosthetic cardiac valve. Normal cardiac size and mediastinal contours. Left upper chest surgical clips. Stable lung volumes. No pneumothorax, pulmonary edema, pleural effusion. Regression of mild bilateral streaky opacity. No new pulmonary opacity. Paucity of bowel gas. IMPRESSION: 1. Improved bilateral ventilation and  regressed bilateral likely infectious streaky and nodular opacity. 2. No new cardiopulmonary abnormality. Electronically Signed   By: Odessa Fleming M.D.   On: 09/23/2020 08:49   EEG adult  Result Date: 09/25/2020 Jefferson Fuel, MD     09/25/2020  8:39 PM Routine EEG Report Nasiyah L Granada is a 45 y.o. female with a history of severe encephalopathy who is undergoing an EEG to evaluate for seizures. EEG was recorded on fentanyl, versed was paused x30 min prior to recording. Report: This EEG was acquired with electrodes placed according to the International 10-20 electrode system (including Fp1, Fp2, F3, F4, C3, C4, P3, P4, O1, O2, T3, T4, T5, T6, A1, A2, Fz, Cz, Pz). The following electrodes were missing or displaced: none. The best background was 7-8 Hz near-continuous activity with bifrontotemporal slowing and relative suppression over the L temporoparietal region. This activity is reactive to stimulation. There was some sleep architecture (spindles) present). There were no interictal epileptiform discharges. There were no electrographic seizures identified. There was no abnormal response to photic stimulation. Hyperventilation was not performed. Impression and clinical correlation: This EEG was obtained while comatose and is abnormal due to mild diffuse slowing and bifrontotemporal focal slowing indicating generalized and bilateral focal cerebral dysfunction. Bing Neighbors, MD [facility] [phone] If 7pm- 7am, please page neurology on call as listed in AMION.   DG FLUORO GUIDED NEEDLE PLC ASPIRATION/INJECTION LOC  Result Date: 09/27/2020 CLINICAL DATA:  Altered mental status EXAM: DIAGNOSTIC LUMBAR PUNCTURE UNDER FLUOROSCOPIC GUIDANCE COMPARISON:  None FLUOROSCOPY TIME:  Fluoroscopy Time:  12 seconds Radiation Exposure Index (if provided by the fluoroscopic device): 1.4 mGy PROCEDURE: Informed consent was obtained from the patient prior to the procedure, including potential complications of  headache, allergy, and pain. With the patient prone, the lower back was prepped with Betadine. 1% Lidocaine was used for local anesthesia. Lumbar puncture was performed at the L2-L3 level using a 20 gauge needle with return of clear CSF. 13 mL of CSF were obtained for laboratory studies. The patient tolerated the procedure well and there were no apparent complications. IMPRESSION: Technically successful fluoroscopic guided lumbar puncture. Electronically Signed   By: Guadlupe Spanish M.D.   On: 09/27/2020 10:53   ECHOCARDIOGRAM COMPLETE  Result Date: 09/25/2020    ECHOCARDIOGRAM REPORT   Patient Name:   SYLVAN LAHM Date of Exam: 09/24/2020 Medical Rec #:  [id number]       Height:       60.0 in Accession #:    [id number]      Weight:       107.4 lb Date of Birth:  24-Nov-1975      BSA:          1.433 m Patient Age:    44 years        BP:           135/78 mmHg Patient Gender: F               HR:           84 bpm. Exam Location:  [facility] Procedure: 2D Echo, Cardiac Doppler and Color Doppler Indications:     I38 Endocarditis  History:         Patient has prior  history of Echocardiogram examinations, most                  recent 08/31/2019. Risk Factors:Substance abuse. Hepatitis C.  Sonographer:     Sedonia Small Rodgers-Jones Referring Phys:  [id number] Judithe Modest Diagnosing Phys: Debbe Odea MD IMPRESSIONS  1. Left ventricular ejection fraction, by estimation, is 55 to 60%. The left ventricle has normal function. The left ventricle has no regional wall motion abnormalities. Left ventricular diastolic parameters are consistent with Grade II diastolic dysfunction (pseudonormalization).  2. Right ventricular systolic function is normal. The right ventricular size is normal.  3. Left atrial size was mildly dilated.  4. The mitral valve is normal in structure. Mild to moderate mitral valve regurgitation.  5. There is a mobile mass/structure attached to the tricuspid valve leaflet/prosthesis (image 31-34) suggesting a  vegetation. recommend obtaining a TEE for better evaluation.. The tricuspid valve is has been repaired/replaced.  6. The aortic valve is grossly normal. Aortic valve regurgitation is not visualized.  7. The inferior vena cava is dilated in size with >50% respiratory variability, suggesting right atrial pressure of 8 mmHg. Conclusion(s)/Recommendation(s): Findings concerning for vegetataion, would recommend Transesophageal Echocardiogram for clarification. FINDINGS  Left Ventricle: Left ventricular ejection fraction, by estimation, is 55 to 60%. The left ventricle has normal function. The left ventricle has no regional wall motion abnormalities. The left ventricular internal cavity size was normal in size. There is  no left ventricular hypertrophy. Left ventricular diastolic parameters are consistent with Grade II diastolic dysfunction (pseudonormalization). Right Ventricle: The right ventricular size is normal. No increase in right ventricular wall thickness. Right ventricular systolic function is normal. Left Atrium: Left atrial size was mildly dilated. Right Atrium: Right atrial size was normal in size. Pericardium: There is no evidence of pericardial effusion. Mitral Valve: The mitral valve is normal in structure. Mild to moderate mitral valve regurgitation. Tricuspid Valve: There is a mobile mass/structure attached to the tricuspid valve leaflet/prosthesis (image 31-34) suggesting a vegetation. recommend obtaining a TEE for better evaluation. The tricuspid valve is has been repaired/replaced. Tricuspid valve regurgitation is mild. Aortic Valve: The aortic valve is grossly normal. Aortic valve regurgitation is not visualized. Pulmonic Valve: The pulmonic valve was not well visualized. Pulmonic valve regurgitation is not visualized. Aorta: The aortic root is normal in size and structure. Venous: The inferior vena cava is dilated in size with greater than 50% respiratory variability, suggesting right atrial  pressure of 8 mmHg. IAS/Shunts: No atrial level shunt detected by color flow Doppler.  LEFT VENTRICLE PLAX 2D LVIDd:         4.49 cm  Diastology LVIDs:         3.15 cm  LV e' medial:    6.53 cm/s LV PW:         0.78 cm  LV E/e' medial:  18.4 LV IVS:        0.85 cm  LV e' lateral:   9.36 cm/s LVOT diam:     1.80 cm  LV E/e' lateral: 12.8 LV SV:         48 LV SV Index:   33 LVOT Area:     2.54 cm  RIGHT VENTRICLE            IVC RV Basal diam:  3.56 cm    IVC diam: 2.13 cm RV S prime:     6.96 cm/s TAPSE (M-mode): 1.1 cm LEFT ATRIUM  Index       RIGHT ATRIUM           Index LA diam:        4.70 cm 3.28 cm/m  RA Area:     11.00 cm LA Vol (A2C):   48.4 ml 33.78 ml/m RA Volume:   25.10 ml  17.52 ml/m LA Vol (A4C):   50.2 ml 35.03 ml/m LA Biplane Vol: 49.4 ml 34.47 ml/m  AORTIC VALVE LVOT Vmax:   100.10 cm/s LVOT Vmean:  63.450 cm/s LVOT VTI:    0.188 m  AORTA Ao Root diam: 2.70 cm MITRAL VALVE                TRICUSPID VALVE MV Area (PHT): 5.54 cm     TV Peak grad:   6.8 mmHg MV Decel Time: 137 msec     TV Mean grad:   4.0 mmHg MR Peak grad: 116.6 mmHg    TV Vmax:        1.30 m/s MR Mean grad: 83.0 mmHg     TV Vmean:       96.8 cm/s MR Vmax:      540.00 cm/s   TV VTI:         0.35 msec MR Vmean:     433.5 cm/s MV E velocity: 120.00 cm/s  SHUNTS MV A velocity: 63.60 cm/s   Systemic VTI:  0.19 m MV E/A ratio:  1.89         Systemic Diam: 1.80 cm Debbe Odea MD Electronically signed by Debbe Odea MD Signature Date/Time: 09/25/2020/5:25:41 PM    Final    ECHO TEE  Result Date: 09/26/2020    TRANSESOPHOGEAL ECHO REPORT   Patient Name:   LAQUEENA HINCHEY Date of Exam: 09/26/2020 Medical Rec #:  [id number]       Height:       60.0 in Accession #:    [id number]      Weight:       107.4 lb Date of Birth:  1975/08/29      BSA:          1.433 m Patient Age:    44 years        BP:           110/68 mmHg Patient Gender: F               HR:           88 bpm. Exam Location:  [facility] Procedure: Transesophageal  Echo Indications:    endocarditis  History:        Patient has prior history of Echocardiogram examinations.                 Endocarditis.  Sonographer:    JERRY Referring Phys: [id number] Lamar Blinks PROCEDURE: The transesophogeal probe was passed without difficulty through the esophogus of the patient. Sedation performed by performing physician. The patient developed no complications during the procedure. IMPRESSIONS  1. Left ventricular ejection fraction, by estimation, is 60 to 65%. The left ventricle has normal function.  2. Right ventricular systolic function is normal. The right ventricular size is normal.  3. No left atrial/left atrial appendage thrombus was detected.  4. Right atrial size was mildly dilated.  5. The mitral valve is normal in structure. Mild mitral valve regurgitation.  6. There is evidence of 1cm vegeation. The tricuspid valve is has been repaired/replaced. Tricuspid valve regurgitation is mild to moderate.  7. The aortic valve  is normal in structure. Aortic valve regurgitation is trivial. FINDINGS  Left Ventricle: Left ventricular ejection fraction, by estimation, is 60 to 65%. The left ventricle has normal function. The left ventricular internal cavity size was normal in size. Right Ventricle: The right ventricular size is normal. No increase in right ventricular wall thickness. Right ventricular systolic function is normal. Left Atrium: Left atrial size was normal in size. No left atrial/left atrial appendage thrombus was detected. Right Atrium: Right atrial size was mildly dilated. Pericardium: There is no evidence of pericardial effusion. Mitral Valve: The mitral valve is normal in structure. Mild mitral valve regurgitation. Tricuspid Valve: There is evidence of 1cm vegeation. The tricuspid valve is has been repaired/replaced. Tricuspid valve regurgitation is mild to moderate. Aortic Valve: The aortic valve is normal in structure. Aortic valve regurgitation is trivial. Pulmonic Valve:  The pulmonic valve was normal in structure. Pulmonic valve regurgitation is trivial. Aorta: The aortic root and ascending aorta are structurally normal, with no evidence of dilitation. IAS/Shunts: The interatrial septum was not assessed. Arnoldo Hooker MD Electronically signed by Arnoldo Hooker MD Signature Date/Time: 09/26/2020/2:17:08 PM    Final    [place] EKG SITE RITE  Result Date: 10/02/2020 If Site Rite image not attached, placement could not be confirmed due to current cardiac rhythm.  [place] EKG SITE RITE  Result Date: 09/26/2020 If Site Rite image not attached, placement could not be confirmed due to current cardiac rhythm.  US ABDOMEN LIMITED RUQ (LIVER/GB)  Result Date: 09/23/2020 CLINICAL DATA:  Pain.  Encephalopathy. EXAM: ULTRASOUND ABDOMEN LIMITED RIGHT UPPER QUADRANT COMPARISON:  CT abdomen pelvis 09/23/2020 FINDINGS: Gallbladder: Gallbladder sludge identified. Enlarged gallbladder wall measuring up to 6 mm. No pericholecystic fluid. No gallstones. No sonographic Murphy sign noted by sonographer. Common bile duct: Diameter: 2 mm. Liver: Nodular hepatic contour. No focal lesion identified. Coarsened heterogeneous parenchymal echogenicity. Portal vein is patent on color Doppler imaging with normal direction of blood flow towards the liver. Other: Increased renal parenchymal echogenicity. IMPRESSION: 1. Gallbladder sludge with gallbladder wall thickening which could be due to chronic liver disease in a patient with cirrhosis. No definite finding of acute cholecystitis or choledocholithiasis. 2. Increased renal parenchymal echogenicity suggestive of renal parenchymal disease. Electronically Signed   By: Tish Frederickson M.D.   On: 09/23/2020 03:35    Microbiology: No results found for this or any previous visit (from the past 240 hour(s)).   Labs: Basic Metabolic Panel: Recent Labs  Lab 10/09/20 0303 10/09/20 0925 10/11/20 0341 10/12/20 0940  NA 133*  --  133* 134*  K 3.0*  --  2.9* 3.0*   CL 102  --  101 103  CO2 22  --  24 25  GLUCOSE 151*  --  110* 119*  BUN 18  --  15 11  CREATININE 0.78  --  0.84 0.74  CALCIUM 8.7*  --  8.6* 8.6*  MG  --  2.0 2.2 2.2   Liver Function Tests: No results for input(s): AST, ALT, ALKPHOS, BILITOT, PROT, ALBUMIN in the last 168 hours. No results for input(s): LIPASE, AMYLASE in the last 168 hours. No results for input(s): AMMONIA in the last 168 hours. CBC: Recent Labs  Lab 10/09/20 0303 10/09/20 1540 10/10/20 0329 10/11/20 0341 10/12/20 0940  WBC 10.1  --  9.7 11.0* 12.8*  HGB 7.4* 7.2* 7.3* 7.2* 6.9*  HCT 23.8* 23.1* 23.5* 23.0* 21.8*  MCV 82.6  --  83.0 83.0 82.9  PLT 156  --  133* 127*  121*   Cardiac Enzymes: No results for input(s): CKTOTAL, CKMB, CKMBINDEX, TROPONINI in the last 168 hours. BNP: BNP (last 3 results) Recent Labs    09/24/20 1001  BNP 737.1*    ProBNP (last 3 results) No results for input(s): PROBNP in the last 8760 hours.  CBG: No results for input(s): GLUCAP in the last 168 hours.     Signed:  Ramiro Harvest MD.  [facility] 10/12/2020, 3:12 PM

## 2020-10-15 ENCOUNTER — Ambulatory Visit: Payer: Medicaid Other | Admitting: Internal Medicine

## 2020-10-23 ENCOUNTER — Ambulatory Visit: Payer: Medicaid Other | Admitting: Gerontology

## 2020-10-25 LAB — FUNGUS CULTURE WITH STAIN

## 2020-10-25 LAB — FUNGUS CULTURE RESULT

## 2020-10-25 LAB — FUNGAL ORGANISM REFLEX

## 2020-11-24 ENCOUNTER — Emergency Department
Admission: EM | Admit: 2020-11-24 | Discharge: 2020-11-24 | Discharge status: Against Medical Advice | Payer: Medicaid Other

## 2020-11-24 NOTE — ED Triage Notes (Signed)
Pt called x's 3..the patient walked up to front desk and reported that she had been on the phone calling someone to come get her and take her to [facility] because that is where she gets her care.

## 2020-11-24 NOTE — ED Triage Notes (Signed)
Pt called from [facility] to treatment room, no response 

## 2021-01-20 ENCOUNTER — Other Ambulatory Visit: Payer: Self-pay

## 2021-01-20 ENCOUNTER — Emergency Department (HOSPITAL_COMMUNITY): Payer: No Typology Code available for payment source

## 2021-01-20 ENCOUNTER — Emergency Department (HOSPITAL_COMMUNITY)
Admission: EM | Admit: 2021-01-20 | Discharge: 2021-01-20 | Disposition: A | Discharge status: Home / Self Care | Payer: No Typology Code available for payment source | Attending: Emergency Medicine | Admitting: Emergency Medicine

## 2021-01-20 DIAGNOSIS — R4689 Other symptoms and signs involving appearance and behavior: Secondary | ICD-10-CM

## 2021-01-20 DIAGNOSIS — R61 Generalized hyperhidrosis: Secondary | ICD-10-CM | POA: Insufficient documentation

## 2021-01-20 DIAGNOSIS — R109 Unspecified abdominal pain: Secondary | ICD-10-CM | POA: Insufficient documentation

## 2021-01-20 DIAGNOSIS — Y900 Blood alcohol level of less than 20 mg/100 ml: Secondary | ICD-10-CM | POA: Insufficient documentation

## 2021-01-20 DIAGNOSIS — R000 Tachycardia, unspecified: Secondary | ICD-10-CM | POA: Insufficient documentation

## 2021-01-20 DIAGNOSIS — R4182 Altered mental status, unspecified: Secondary | ICD-10-CM | POA: Diagnosis not present

## 2021-01-20 DIAGNOSIS — D72829 Elevated white blood cell count, unspecified: Secondary | ICD-10-CM | POA: Diagnosis not present

## 2021-01-20 DIAGNOSIS — R456 Violent behavior: Secondary | ICD-10-CM | POA: Diagnosis present

## 2021-01-20 LAB — COMPREHENSIVE METABOLIC PANEL
ALT: 17 U/L (ref 0–44)
AST: 36 U/L (ref 15–41)
Albumin: 3.8 g/dL (ref 3.5–5.0)
Alkaline Phosphatase: 72 U/L (ref 38–126)
Anion gap: 9 (ref 5–15)
BUN: 19 mg/dL (ref 6–20)
CO2: 26 mmol/L (ref 22–32)
Calcium: 9.3 mg/dL (ref 8.9–10.3)
Chloride: 103 mmol/L (ref 98–111)
Creatinine, Ser: 0.88 mg/dL (ref 0.44–1.00)
GFR, Estimated: >60 mL/min (ref >60)
Glucose, Bld: 98 mg/dL (ref 70–99)
Potassium: 3.8 mmol/L (ref 3.5–5.1)
Sodium: 138 mmol/L (ref 135–145)
Total Bilirubin: 0.4 mg/dL (ref 0.3–1.2)
Total Protein: 8.3 g/dL — ABNORMAL HIGH (ref 6.5–8.1)

## 2021-01-20 LAB — CBC WITH DIFFERENTIAL/PLATELET
Abs Immature Granulocytes: 0.03 10*3/uL (ref 0.00–0.07)
Basophils Absolute: 0.1 10*3/uL (ref 0.0–0.1)
Basophils Relative: 1 %
Eosinophils Absolute: 0.1 10*3/uL (ref 0.0–0.5)
Eosinophils Relative: 1 %
HCT: 37.9 % (ref 36.0–46.0)
Hemoglobin: 11.5 g/dL — ABNORMAL LOW (ref 12.0–15.0)
Immature Granulocytes: 0 %
Lymphocytes Relative: 20 %
Lymphs Abs: 2.5 10*3/uL (ref 0.7–4.0)
MCH: 23.4 pg — ABNORMAL LOW (ref 26.0–34.0)
MCHC: 30.3 g/dL (ref 30.0–36.0)
MCV: 77 fL — ABNORMAL LOW (ref 80.0–100.0)
Monocytes Absolute: 0.9 10*3/uL (ref 0.1–1.0)
Monocytes Relative: 7 %
Neutro Abs: 8.6 10*3/uL — ABNORMAL HIGH (ref 1.7–7.7)
Neutrophils Relative %: 71 %
Platelets: 231 10*3/uL (ref 150–400)
RBC: 4.92 MIL/uL (ref 3.87–5.11)
RDW: 16.5 % — ABNORMAL HIGH (ref 11.5–15.5)
WBC: 12.1 10*3/uL — ABNORMAL HIGH (ref 4.0–10.5)
nRBC: 0 % (ref 0.0–0.2)

## 2021-01-20 LAB — CK: Total CK: 167 U/L (ref 38–234)

## 2021-01-20 LAB — I-STAT BETA HCG BLOOD, ED (MC, WL, AP ONLY): I-stat hCG, quantitative: <5 m[IU]/mL (ref <5)

## 2021-01-20 LAB — SALICYLATE LEVEL: Salicylate Lvl: <7 mg/dL — ABNORMAL LOW (ref 7.0–30.0)

## 2021-01-20 LAB — AMMONIA: Ammonia: 38 umol/L — ABNORMAL HIGH (ref 9–35)

## 2021-01-20 LAB — ETHANOL: Alcohol, Ethyl (B): <10 mg/dL (ref <10)

## 2021-01-20 LAB — ACETAMINOPHEN LEVEL: Acetaminophen (Tylenol), Serum: <10 ug/mL — ABNORMAL LOW (ref 10–30)

## 2021-01-20 LAB — LACTIC ACID, PLASMA: Lactic Acid, Venous: 0.8 mmol/L (ref 0.5–1.9)

## 2021-01-20 MED ORDER — IOHEXOL 350 MG/ML SOLN: 80.0000 mL | Freq: Once | INTRAVENOUS | Status: DC | PRN

## 2021-01-20 MED ORDER — LACTATED RINGERS IV BOLUS
500.0000 mL | Freq: Once | INTRAVENOUS | Status: AC
  Administered 2021-01-20: 500 mL via INTRAVENOUS

## 2021-01-20 NOTE — ED Notes (Signed)
Phlebotomy called for labs, states they will be here soon

## 2021-01-20 NOTE — ED Triage Notes (Signed)
Patient BIB [facility] for altered mental status. EMS reports that patient was found in an apartment [place] parking lot. She was going from car to car trying to get in one. Patient was drinking random cups of water. Patient did report history of anxiety but is non-compliant with medication. EMS also states that patient was paranoid and diaphoretic. EMS administered Haldol 5mg  IM for extreme agitation

## 2021-01-20 NOTE — ED Provider Notes (Signed)
[facility]-EMERGENCY DEPT Provider Note   CSN: [id number] Arrival date & time: 01/20/21  0133     History Chief Complaint  Patient presents with   Medical Clearance    Andrea King is a 45 y.o. female who presents via EMS for altered mental status after being found wandering through an apartment complex parking lot going from for the car trying to enter them.  According to EMS she was drinking lots of water, at that time she is paranoid, diaphoretic and extremely agitated.  In order to safely transfer her to the ED EMS did administer 5 mg of Haldol IM.  At this time patient remains very somnolent likely from Haldol.  Level 5 caveat due to altered mental status on presentation.  At this time she would not answer any of my questions, stating that she is too cold to talk, blanket provided.  Patient states she "has never done drugs".   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  She has chronic E, liver cirrhosis, and history of epidural abscesses and encephalopathy.  Additionally his history of spontaneous peritonitis at time of admission for endocarditis.  HPI     No past medical history on file.  There are no problems to display for this patient.      OB History   No obstetric history on file.     No family history on file.     Home Medications Prior to Admission medications   Not on File    Allergies    Haldol [haloperidol lactate]  Review of Systems   Review of Systems  Physical Exam Updated Vital Signs BP 112/78 Comment: Simultaneous filing. User may not have seen previous data.  Pulse 90   Temp 97.6 F (36.4 C) (Axillary)   Resp 19   Ht 5\' 3"  (1.6 m)   Wt 61.2 kg   SpO2 98%   BMI 23.91 kg/m   Physical Exam Vitals and nursing note  reviewed.  Constitutional:      General: She is not in acute distress.    Appearance: She is normal weight. She is not toxic-appearing.  HENT:     Head: Normocephalic and atraumatic.     Nose: Nose normal. No congestion.     Mouth/Throat:     Mouth: Mucous membranes are moist.     Pharynx: Oropharynx is clear. Uvula midline. No oropharyngeal exudate or posterior oropharyngeal erythema.     Tonsils: No tonsillar exudate.  Eyes:     General: Lids are normal. Vision grossly intact. No scleral icterus.       Right eye: No discharge.        Left eye: No discharge.     Extraocular Movements: Extraocular movements intact.     Conjunctiva/sclera:     Right eye: Right conjunctiva is injected.     Left eye: Left conjunctiva is injected.     Pupils: Pupils are equal, round, and reactive to light.  Neck:     Trachea: Trachea and phonation normal.  Cardiovascular:     Rate and Rhythm: Normal rate and regular rhythm.     Pulses: Normal pulses.     Heart sounds: Normal heart sounds. No murmur heard. Pulmonary:     Effort: Pulmonary effort is normal. No tachypnea, bradypnea, accessory muscle usage, prolonged expiration or respiratory  distress.     Breath sounds: Examination of the right-lower field reveals decreased breath sounds and rhonchi. Examination of the left-lower field reveals decreased breath sounds. Decreased breath sounds and rhonchi present. No wheezing or rales.  Chest:     Chest wall: No mass, lacerations, deformity, swelling, tenderness, crepitus or edema.    Abdominal:     General: Bowel sounds are normal. There is distension.     Palpations: Abdomen is soft.     Tenderness: There is abdominal tenderness. There is no right CVA tenderness, left CVA tenderness, guarding or rebound.  Musculoskeletal:        General: No deformity.     Cervical back: Normal range of motion and neck supple. No edema, rigidity, tenderness or crepitus. No pain with movement, spinous process tenderness  or muscular tenderness.     Right lower leg: No edema.     Left lower leg: No edema.  Lymphadenopathy:     Cervical: No cervical adenopathy.  Skin:    General: Skin is warm and dry.     Capillary Refill: Capillary refill takes less than 2 seconds.  Neurological:     Mental Status: She is alert. She is disoriented.     GCS: GCS eye subscore is 2. GCS verbal subscore is 4. GCS motor subscore is 4.     Comments: Patient moving all 4 extremities spontaneously without difficulty, somnolent, agitated when woken.  On willing/unable to follow commands for neurologic exam.  Psychiatric:        Mood and Affect: Mood normal.    ED Results / Procedures / Treatments   Labs (all labs ordered are listed, but only abnormal results are displayed) Labs Reviewed  COMPREHENSIVE METABOLIC PANEL - Abnormal; Notable for the following components:      Result Value   Total Protein 8.3 (*)    All other components within normal limits  CBC WITH DIFFERENTIAL/PLATELET - Abnormal; Notable for the following components:   WBC 12.1 (*)    Hemoglobin 11.5 (*)    MCV 77.0 (*)    MCH 23.4 (*)    RDW 16.5 (*)    Neutro Abs 8.6 (*)    All other components within normal limits  ACETAMINOPHEN LEVEL - Abnormal; Notable for the following components:   Acetaminophen (Tylenol), Serum <10 (*)    All other components within normal limits  SALICYLATE LEVEL - Abnormal; Notable for the following components:   Salicylate Lvl <7.0 (*)    All other components within normal limits  AMMONIA - Abnormal; Notable for the following components:   Ammonia 38 (*)    All other components within normal limits  CULTURE, BLOOD (ROUTINE X 2)  CULTURE, BLOOD (ROUTINE X 2)  ETHANOL  CK  LACTIC ACID, PLASMA  RAPID URINE DRUG SCREEN, HOSP PERFORMED  CBC  LACTIC ACID, PLASMA  I-STAT BETA HCG BLOOD, ED ([facility], [facility], [facility] ONLY)  I-STAT BETA HCG BLOOD, ED ([facility], [facility], [facility] ONLY)    EKG EKG: sinus rhythm, no STEMI.   Radiology DG Chest Portable  1 View  Result Date: 01/20/2021 CLINICAL DATA:  Altered mental status. EXAM: PORTABLE CHEST 1 VIEW COMPARISON:  Sep 29, 2020. FINDINGS: The heart size and mediastinal contours are within normal limits. Status post cardiac valve repair. Both lungs are clear. The visualized skeletal structures are unremarkable. IMPRESSION: No active disease. Electronically Signed   By: Lupita Raider M.D.   On: 01/20/2021 14:36    Procedures Procedures   Medications Ordered  in ED Medications  iohexol (OMNIPAQUE) 350 MG/ML injection 80 mL (has no administration in time range)  lactated ringers bolus 500 mL (0 mLs Intravenous Stopped 01/20/21 1500)    ED Course  I have reviewed the triage vital signs and the nursing notes.  Pertinent labs & imaging results that were available during my care of the patient were reviewed by me and considered in my medical decision making (see chart for details).    MDM Rules/Calculators/A&P                         45 year old female with history of IV drug abuse and multiple admissions for sepsis and endocarditis secondary to abuse, cirrhosis who presents with altered mental status via EMS.  The differential diagnosis for AMS is extensive and includes, but is not limited to:  Drug overdose - opioids, alcohol, sedatives, antipsychotics, drug withdrawal, others Metabolic: hypoxia, hypoglycemia, hyperglycemia, hypercalcemia, hypernatremia, hyponatremia, uremia, hepatic encephalopathy, hypothyroidism, hyperthyroidism, vitamin B12 or thiamine deficiency, carbon monoxide poisoning, Wilson's disease, Lactic acidosis, DKA/HHOS Infectious: meningitis, encephalitis, bacteremia/sepsis, urinary tract infection, pneumonia, neurosyphilis Structural: Space-occupying lesion, (brain tumor, subdural hematoma, hydrocephalus,) Vascular: stroke, subarachnoid hemorrhage, coronary ischemia, hypertensive encephalopathy, CNS vasculitis, thrombotic thrombocytopenic purpura, disseminated intravascular  coagulation, hyperviscosity Psychiatric: Schizophrenia, depression; Other: Seizure, hypothermia, heat stroke, ICU psychosis, dementia -"sundowning."  Tachycardic on intake, vital signs otherwise normal.  Cardiopulmonary exam is normal.  No murmur.  Abdomen generally mildly tender to palpation the patient is unable to answer any my questions at this time due to altered mental status.  Neurovascular intact in all 4 extremities.  Moving all 4 extremities spontaneously without difficulty.  GCS of 8.  Extensive difficulty obtaining laboratory studies from this patient due to history of IV drug use and scarred veins.  Several hours past prior to being able to obtain laboratory studies.  CBC with mild leukocytosis of 12.1, hemoglobin stable 11.5.  CMP unremarkable.  CK normal 167, ammonia level mildly elevated to 38.  Acetaminophen level and salicylate levels are normal and lactic acid is not elevated.  Chest x-ray negative for acute cardiopulmonary disease and EKG that revealed sinus rhythm without STEMI.  CT of the abdomen pelvis ordered due to general tenderness to palpation and history of spontaneous peritonitis, however patient refused scan.  States she did not want any further medical evaluation.  She was able to walk, eat, use the restroom independently, and has had normal vital signs throughout the majority of her stay in the emergency department.    Given her refusal for any further medical intervention or evaluation and her normal vital signs and mental status at baseline at this time no further work-up is warranted needed this time.  Will discharge patient.  [place] voiced understanding for medical evaluation and treatment plan.  Each of her questions was answered to her expressed satisfaction.  Return precautions were given.  Patient is well-appearing, stable, and appropriate for discharge at this time.  This chart was dictated using voice recognition software, Dragon. Despite the best efforts of  this provider to proofread and correct errors, errors may still occur which can change documentation meaning.   Final Clinical Impression(s) / ED Diagnoses Final diagnoses:  Aggressive behavior    Rx / DC Orders ED Discharge Orders     None        Paris Lore, PA-C 01/20/21 1600    Mancel Bale, MD 01/20/21 1733

## 2021-01-20 NOTE — ED Notes (Signed)
Phlebotomy called for labs 

## 2021-01-20 NOTE — Discharge Instructions (Addendum)
You are seen in the ER today for your confusion and altered mental status.  Your physical exam and vital signs are very reassuring.  You refused to let us continue to evaluate you and stated that you wanted to be discharged.  Given you are able to eat, drink, walk, and her vital signs are normal, you have been discharged.  Please follow-up with your primary care doctor and return to the ER with any new serious symptoms.

## 2021-01-20 NOTE — ED Notes (Signed)
Multiple failed attempts to obtain blood/start IV. Consult placed

## 2021-01-20 NOTE — ED Notes (Signed)
Pt refusing blood draw from nursing staff at this time

## 2021-01-20 NOTE — ED Notes (Signed)
Phlebotomy called - states they will be here soon

## 2021-01-20 NOTE — ED Notes (Signed)
Pt walked to restroom with steady gait. Pt given urine cup and asked for a sample. Pt did not provide urine sample. Pa aware

## 2021-01-20 NOTE — ED Notes (Signed)
Pt given coke 

## 2021-01-20 NOTE — ED Notes (Signed)
Phlebotomy called, states they will be here soon

## 2021-09-13 ENCOUNTER — Emergency Department (HOSPITAL_BASED_OUTPATIENT_CLINIC_OR_DEPARTMENT_OTHER)
Admission: EM | Admit: 2021-09-13 | Discharge: 2021-09-13 | Disposition: A | Discharge status: Home / Self Care | Payer: Medicaid Other | Attending: Emergency Medicine | Admitting: Emergency Medicine

## 2021-09-13 ENCOUNTER — Emergency Department (HOSPITAL_BASED_OUTPATIENT_CLINIC_OR_DEPARTMENT_OTHER): Payer: Medicaid Other | Admitting: Radiology

## 2021-09-13 ENCOUNTER — Other Ambulatory Visit: Payer: Self-pay

## 2021-09-13 ENCOUNTER — Emergency Department (HOSPITAL_BASED_OUTPATIENT_CLINIC_OR_DEPARTMENT_OTHER): Payer: Medicaid Other

## 2021-09-13 ENCOUNTER — Encounter (HOSPITAL_BASED_OUTPATIENT_CLINIC_OR_DEPARTMENT_OTHER): Payer: Self-pay | Admitting: Emergency Medicine

## 2021-09-13 DIAGNOSIS — R188 Other ascites: Secondary | ICD-10-CM | POA: Insufficient documentation

## 2021-09-13 DIAGNOSIS — J181 Lobar pneumonia, unspecified organism: Secondary | ICD-10-CM | POA: Insufficient documentation

## 2021-09-13 DIAGNOSIS — R509 Fever, unspecified: Secondary | ICD-10-CM | POA: Diagnosis present

## 2021-09-13 DIAGNOSIS — R000 Tachycardia, unspecified: Secondary | ICD-10-CM | POA: Insufficient documentation

## 2021-09-13 DIAGNOSIS — D649 Anemia, unspecified: Secondary | ICD-10-CM | POA: Diagnosis not present

## 2021-09-13 DIAGNOSIS — Z20822 Contact with and (suspected) exposure to covid-19: Secondary | ICD-10-CM | POA: Diagnosis not present

## 2021-09-13 DIAGNOSIS — K5900 Constipation, unspecified: Secondary | ICD-10-CM | POA: Insufficient documentation

## 2021-09-13 DIAGNOSIS — D72829 Elevated white blood cell count, unspecified: Secondary | ICD-10-CM | POA: Diagnosis not present

## 2021-09-13 DIAGNOSIS — J189 Pneumonia, unspecified organism: Secondary | ICD-10-CM

## 2021-09-13 LAB — COMPREHENSIVE METABOLIC PANEL
ALT: 12 U/L (ref 0–44)
AST: 14 U/L — ABNORMAL LOW (ref 15–41)
Albumin: 3.7 g/dL (ref 3.5–5.0)
Alkaline Phosphatase: 60 U/L (ref 38–126)
Anion gap: 8 (ref 5–15)
BUN: 10 mg/dL (ref 6–20)
CO2: 27 mmol/L (ref 22–32)
Calcium: 8.6 mg/dL — ABNORMAL LOW (ref 8.9–10.3)
Chloride: 101 mmol/L (ref 98–111)
Creatinine, Ser: 0.84 mg/dL (ref 0.44–1.00)
GFR, Estimated: >60 mL/min (ref >60)
Glucose, Bld: 91 mg/dL (ref 70–99)
Potassium: 3.9 mmol/L (ref 3.5–5.1)
Sodium: 136 mmol/L (ref 135–145)
Total Bilirubin: 0.4 mg/dL (ref 0.3–1.2)
Total Protein: 6.6 g/dL (ref 6.5–8.1)

## 2021-09-13 LAB — CBC WITH DIFFERENTIAL/PLATELET
Abs Immature Granulocytes: 0.06 10*3/uL (ref 0.00–0.07)
Basophils Absolute: 0.1 10*3/uL (ref 0.0–0.1)
Basophils Relative: 0 %
Eosinophils Absolute: 0.2 10*3/uL (ref 0.0–0.5)
Eosinophils Relative: 1 %
HCT: 27.9 % — ABNORMAL LOW (ref 36.0–46.0)
Hemoglobin: 8.8 g/dL — ABNORMAL LOW (ref 12.0–15.0)
Immature Granulocytes: 1 %
Lymphocytes Relative: 15 %
Lymphs Abs: 1.9 10*3/uL (ref 0.7–4.0)
MCH: 26.1 pg (ref 26.0–34.0)
MCHC: 31.5 g/dL (ref 30.0–36.0)
MCV: 82.8 fL (ref 80.0–100.0)
Monocytes Absolute: 0.9 10*3/uL (ref 0.1–1.0)
Monocytes Relative: 7 %
Neutro Abs: 9.6 10*3/uL — ABNORMAL HIGH (ref 1.7–7.7)
Neutrophils Relative %: 76 %
Platelets: 204 10*3/uL (ref 150–400)
RBC: 3.37 MIL/uL — ABNORMAL LOW (ref 3.87–5.11)
RDW: 15.3 % (ref 11.5–15.5)
WBC: 12.6 10*3/uL — ABNORMAL HIGH (ref 4.0–10.5)
nRBC: 0 % (ref 0.0–0.2)

## 2021-09-13 LAB — LACTIC ACID, PLASMA: Lactic Acid, Venous: 0.4 mmol/L — ABNORMAL LOW (ref 0.5–1.9)

## 2021-09-13 LAB — URINALYSIS, ROUTINE W REFLEX MICROSCOPIC
Bilirubin Urine: NEGATIVE
Glucose, UA: NEGATIVE mg/dL
Hgb urine dipstick: NEGATIVE
Ketones, ur: NEGATIVE mg/dL
Leukocytes,Ua: NEGATIVE
Nitrite: NEGATIVE
Protein, ur: NEGATIVE mg/dL
Specific Gravity, Urine: <1.005 — ABNORMAL LOW (ref 1.005–1.030)
pH: 6.5 (ref 5.0–8.0)

## 2021-09-13 LAB — RESP PANEL BY RT-PCR (FLU A&B, COVID) ARPGX2
Influenza A by PCR: NEGATIVE
Influenza B by PCR: NEGATIVE
SARS Coronavirus 2 by RT PCR: NEGATIVE

## 2021-09-13 LAB — PREGNANCY, URINE: Preg Test, Ur: NEGATIVE

## 2021-09-13 MED ORDER — IOHEXOL 300 MG/ML  SOLN
100.0000 mL | Freq: Once | INTRAMUSCULAR | Status: AC | PRN
  Administered 2021-09-13: 85 mL via INTRAVENOUS

## 2021-09-13 MED ORDER — SODIUM CHLORIDE 0.9 % IV BOLUS: 1000.0000 mL | Freq: Once | INTRAVENOUS | Status: DC

## 2021-09-13 MED ORDER — ONDANSETRON HCL 4 MG/2ML IJ SOLN
4.0000 mg | Freq: Once | INTRAMUSCULAR | Status: AC
  Administered 2021-09-13: 4 mg via INTRAVENOUS
  Filled 2021-09-13: qty 2

## 2021-09-13 MED ORDER — FENTANYL CITRATE PF 50 MCG/ML IJ SOSY
50.0000 ug | PREFILLED_SYRINGE | Freq: Once | INTRAMUSCULAR | Status: AC
  Administered 2021-09-13: 50 ug via INTRAVENOUS
  Filled 2021-09-13: qty 1

## 2021-09-13 MED ORDER — SODIUM CHLORIDE 0.9 % IV BOLUS
1000.0000 mL | Freq: Once | INTRAVENOUS | Status: AC
  Administered 2021-09-13: 1000 mL via INTRAVENOUS

## 2021-09-13 MED ORDER — AZITHROMYCIN 250 MG PO TABS: 250.0000 mg | ORAL_TABLET | Freq: Every day | ORAL | 0 refills | Status: AC

## 2021-09-13 MED ORDER — IOHEXOL 300 MG/ML  SOLN
100.0000 mL | Freq: Once | INTRAMUSCULAR | Status: AC | PRN
  Administered 2021-09-13: 60 mL via INTRAVENOUS

## 2021-09-13 MED ORDER — LORAZEPAM 2 MG/ML IJ SOLN
1.0000 mg | Freq: Once | INTRAMUSCULAR | Status: AC
  Administered 2021-09-13: 1 mg via INTRAVENOUS
  Filled 2021-09-13: qty 1

## 2021-09-13 MED ORDER — AMOXICILLIN 500 MG PO TABS: 1000.0000 mg | ORAL_TABLET | Freq: Three times a day (TID) | ORAL | 0 refills | Status: AC

## 2021-09-13 MED ORDER — AZITHROMYCIN 250 MG PO TABS: 250.0000 mg | ORAL_TABLET | Freq: Every day | ORAL | 0 refills | Status: DC

## 2021-09-13 NOTE — ED Provider Notes (Signed)
?[facility] EMERGENCY DEPT ?Provider Note ? ? ?CSN: [id number] ?Arrival date & time: 09/13/21  [id number] ? ?  ? ?History ? ?Chief Complaint  ?Patient presents with  ? Post-op Problem  ? ? ?Andrea King is a 46 y.o. female. ? ?Patient is a 46 year old female who presents with abdominal pain and fever.  She states that she had emergency hernia surgery done at [facility] in [place] on May 7.  She said she was discharged 2 days later.  She said that she has had some ongoing constipation.  She has been using Colace and Metamucil.  She has been having some intermittent difficulty urinating although she was urinating fine today.  She woke up this morning and noted fever and chills.  She did not check her temperature but felt like she had a high fever.  She has had a little bit of a cough and nasal congestion.  No burning on urination.  She feels like her abdomen is a bit bloated and has had a little bit of increased pain.  She said that they fixed a hernia in the top part of her abdomen and also in her left inguinal area.  They did not laparoscopically. ? ? ?  ? ?Home Medications ?Prior to Admission medications   ?Medication Sig Start Date End Date Taking? Authorizing Provider  ?amoxicillin (AMOXIL) 500 MG tablet Take 2 tablets (1,000 mg total) by mouth in the morning, at noon, and at bedtime for 7 days. 09/13/21 09/20/21 Yes Rolan BuccoBelfi, Maiyah Goyne, MD  ?acetaminophen (TYLENOL) 500 MG tablet Take 1,000-1,500 mg by mouth every 6 (six) hours as needed for headache (pain).    [provider]  ?azithromycin (ZITHROMAX) 250 MG tablet Take 1 tablet (250 mg total) by mouth daily. Take first 2 tablets together, then 1 every day until finished. 09/13/21   Rolan BuccoBelfi, Marrie Chandra, MD  ?   ? ?Allergies    ?Haldol [haloperidol lactate]   ? ?Review of Systems   ?Review of Systems  ?Constitutional:  Positive for chills and fever. Negative for diaphoresis and fatigue.  ?HENT:  Positive for congestion and rhinorrhea. Negative for  sneezing.   ?Eyes: Negative.   ?Respiratory:  Negative for cough, chest tightness and shortness of breath.   ?Cardiovascular:  Negative for chest pain and leg swelling.  ?Gastrointestinal:  Positive for abdominal pain, constipation, nausea and vomiting. Negative for blood in stool and diarrhea.  ?Genitourinary:  Negative for difficulty urinating, flank pain, frequency and hematuria.  ?Musculoskeletal:  Negative for arthralgias and back pain.  ?Skin:  Negative for rash.  ?Neurological:  Positive for headaches. Negative for dizziness, speech difficulty, weakness and numbness.  ? ?Physical Exam ?Updated Vital Signs ?BP 102/71   Pulse 73   Temp 99.1 ?F (37.3 ?C) (Oral)   Resp 14   Ht 5\' 2"  (1.575 m)   Wt 59 kg   SpO2 97%   BMI 23.78 kg/m?  ?Physical Exam ?Constitutional:   ?   Appearance: She is well-developed.  ?HENT:  ?   Head: Normocephalic and atraumatic.  ?Eyes:  ?   Pupils: Pupils are equal, round, and reactive to light.  ?Cardiovascular:  ?   Rate and Rhythm: Normal rate and regular rhythm.  ?   Heart sounds: Normal heart sounds.  ?Pulmonary:  ?   Effort: Pulmonary effort is normal. No respiratory distress.  ?   Breath sounds: Normal breath sounds. No wheezing or rales.  ?Chest:  ?   Chest wall: No tenderness.  ?Abdominal:  ?  General: Bowel sounds are normal.  ?   Palpations: Abdomen is soft.  ?   Tenderness: There is abdominal tenderness. There is no guarding or rebound.  ?   Comments: Patient has some mild distention of her abdomen.  She has some small incisions on the anterior abdominal wall from her laparoscopic surgery.  They appear to be well-healing.  No drainage or signs of infection.  She has some mild tenderness along her left abdomen.  There is no incision in the inguinal canal.  ?Musculoskeletal:     ?   General: Normal range of motion.  ?   Cervical back: Normal range of motion and neck supple.  ?Lymphadenopathy:  ?   Cervical: No cervical adenopathy.  ?Skin: ?   General: Skin is warm and  dry.  ?   Findings: No rash.  ?Neurological:  ?   Mental Status: She is alert and oriented to person, place, and time.  ? ? ?ED Results / Procedures / Treatments   ?Labs ?(all labs ordered are listed, but only abnormal results are displayed) ?Labs Reviewed  ?URINALYSIS, ROUTINE W REFLEX MICROSCOPIC - Abnormal; Notable for the following components:  ?    Result Value  ? Color, Urine COLORLESS (*)   ? Specific Gravity, Urine <1.005 (*)   ? All other components within normal limits  ?COMPREHENSIVE METABOLIC PANEL - Abnormal; Notable for the following components:  ? Calcium 8.6 (*)   ? AST 14 (*)   ? All other components within normal limits  ?CBC WITH DIFFERENTIAL/PLATELET - Abnormal; Notable for the following components:  ? WBC 12.6 (*)   ? RBC 3.37 (*)   ? Hemoglobin 8.8 (*)   ? HCT 27.9 (*)   ? Neutro Abs 9.6 (*)   ? All other components within normal limits  ?LACTIC ACID, PLASMA - Abnormal; Notable for the following components:  ? Lactic Acid, Venous 0.4 (*)   ? All other components within normal limits  ?RESP PANEL BY RT-PCR (FLU A&B, COVID) ARPGX2  ?PREGNANCY, URINE  ?LACTIC ACID, PLASMA  ? ? ?EKG ?None ? ?Radiology ?DG Chest 2 View ? ?Result Date: 09/13/2021 ?CLINICAL DATA:  Fever. EXAM: CHEST - 2 VIEW COMPARISON:  Chest x-ray 01/20/2021 FINDINGS: Stable surgical changes from tricuspid valve replacement. The heart is normal in size. The mediastinal and hilar contours are within normal limits. Very patchy irregular nodular appearing opacity in both lungs. Multifocal infiltrates versus is metastatic disease versus septic emboli. Recommend chest CT with contrast for further evaluation. No definite pleural effusions. IMPRESSION: Very patchy irregular nodular appearing opacity in both lungs. Multifocal infiltrates versus metastatic disease versus septic emboli. Recommend chest CT for further evaluation. Electronically Signed   By: Rudie Meyer M.D.   On: 09/13/2021 13:17  ? ?CT Chest W Contrast ? ?Result Date:  09/13/2021 ?CLINICAL DATA:  Pneumonia EXAM: CT CHEST WITH CONTRAST TECHNIQUE: Multidetector CT imaging of the chest was performed during intravenous contrast administration. RADIATION DOSE REDUCTION: This exam was performed according to the departmental dose-optimization program which includes automated exposure control, adjustment of the mA and/or kV according to patient size and/or use of iterative reconstruction technique. CONTRAST:  44mL OMNIPAQUE IOHEXOL 300 MG/ML  SOLN COMPARISON:  Chest x-ray earlier the same day, CT chest 11/13/2015 FINDINGS: Cardiovascular: Heart size is upper normal. No pericardial effusion identified. Coronary artery calcifications. Main pulmonary artery is normal caliber. Thoracic aorta is normal in course and caliber. Mediastinum/Nodes: A few mildly enlarged mediastinal lymph nodes measuring up to  11 mm precarinal. No bulky axillary or hilar lymphadenopathy identified. Lungs/Pleura: Multifocal irregular pleural and parenchymal densities with accompanying mild bronchiectasis and parenchymal cystic foci are identified, mostly in the upper lobes and appears most likely chronic. There are patchy ground-glass densities visualized in the left lower lobe which may be acute. No pleural effusion or pneumothorax. Upper Abdomen: No acute abnormality. Musculoskeletal: No chest wall abnormality. No acute or significant osseous findings. IMPRESSION: 1. Likely chronic upper lobe predominant multifocal irregular pleural and parenchymal densities with accompanying mild bronchiectasis and cystic changes. 2. Mild patchy ground-glass airspace densities in the left lower lobe which may be acute infiltrate. 3. A few mildly enlarged mediastinal lymph nodes. Electronically Signed   By: Jannifer Hick M.D.   On: 09/13/2021 14:45  ? ?CT Abdomen Pelvis W Contrast ? ?Result Date: 09/13/2021 ?CLINICAL DATA:  Postoperative abdominal pain. No other history provided. EXAM: CT ABDOMEN AND PELVIS WITH CONTRAST  TECHNIQUE: Multidetector CT imaging of the abdomen and pelvis was performed using the standard protocol following bolus administration of intravenous contrast. RADIATION DOSE REDUCTION: This exam was performed according to th

## 2021-09-13 NOTE — ED Triage Notes (Incomplete)
Pt had emergency [facility] last Sunday at [facility]. Dc Tuesday. Pt has had hard time voiding, and having bm since dc. She did have bm but had to disimpact herself.  ?

## 2021-09-13 NOTE — Discharge Instructions (Signed)
You need to follow-up with your surgeon on Monday or Tuesday to have a recheck.  You have a fluid collection in your left groin.  This needs to be rechecked by your surgeon.  Return to the emergency room if you have any worsening symptoms.  Take the antibiotics as prescribed. ?

## 2021-09-13 NOTE — ED Notes (Signed)
Pt has a visitor (Mrs. Marletta Lor) who drove her here. Visitor stated she will be waiting in the car, if needed she can be reached at [phone].  ?

## 2021-09-13 NOTE — ED Provider Notes (Signed)
Patient seems very sedated after Ativan for CT scan.  Anticipate discharge once patient is appropriately awake.  Will need serial rechecks. ?Physical Exam  ?BP 102/71   Pulse 73   Temp 99.1 ?F (37.3 ?C) (Oral)   Resp 14   Ht 5\' 2"  (1.575 m)   Wt 59 kg   SpO2 97%   BMI 23.78 kg/m?  ? ?Physical Exam ? ?Procedures  ?Procedures ? ?ED Course / MDM  ?  ?Medical Decision Making ?Amount and/or Complexity of Data Reviewed ?Labs: ordered. ?Radiology: ordered. ? ?Risk ?Prescription drug management. ? ? ?17: 02 patient is still sleeping.  When I go in the room she can awaken now to voice and tactile stimulus.  She seems sedated but then can open her eyes and talk normally.  She was still drifts off pretty quickly when undisturbed and still has appearance of sedation but not of confusion or general illness.  Vital signs remained stable.  Still anticipating discharge.  We will continue to monitor. ? ?17: 38 patient is now awake and conversing.  She is updated on plan of antibiotics and close follow-up. ? ? ? ? ?  ?Charlesetta Shanks, MD ?09/13/21 1739 ? ?

## 2021-11-01 DEATH — deceased
